# Patient Record
Sex: Female | Born: 1955 | Race: Black or African American | Hispanic: No | Marital: Single | Smoking: Current every day smoker
Health system: Southern US, Community
[De-identification: ages and names within clinical notes are randomized; demographics above are authoritative.]

## PROBLEM LIST (undated history)

## (undated) DIAGNOSIS — K648 Other hemorrhoids: Secondary | ICD-10-CM

## (undated) DIAGNOSIS — I6529 Occlusion and stenosis of unspecified carotid artery: Secondary | ICD-10-CM

## (undated) DIAGNOSIS — D696 Thrombocytopenia, unspecified: Secondary | ICD-10-CM

## (undated) DIAGNOSIS — E559 Vitamin D deficiency, unspecified: Secondary | ICD-10-CM

## (undated) DIAGNOSIS — I739 Peripheral vascular disease, unspecified: Secondary | ICD-10-CM

## (undated) DIAGNOSIS — IMO0001 Reserved for inherently not codable concepts without codable children: Secondary | ICD-10-CM

## (undated) DIAGNOSIS — J449 Chronic obstructive pulmonary disease, unspecified: Secondary | ICD-10-CM

## (undated) DIAGNOSIS — D649 Anemia, unspecified: Secondary | ICD-10-CM

## (undated) DIAGNOSIS — I2510 Atherosclerotic heart disease of native coronary artery without angina pectoris: Secondary | ICD-10-CM

## (undated) DIAGNOSIS — F32A Depression, unspecified: Secondary | ICD-10-CM

## (undated) DIAGNOSIS — J9691 Respiratory failure, unspecified with hypoxia: Secondary | ICD-10-CM

## (undated) DIAGNOSIS — R45851 Suicidal ideations: Secondary | ICD-10-CM

## (undated) DIAGNOSIS — J45909 Unspecified asthma, uncomplicated: Secondary | ICD-10-CM

## (undated) DIAGNOSIS — I5189 Other ill-defined heart diseases: Secondary | ICD-10-CM

## (undated) DIAGNOSIS — M1990 Unspecified osteoarthritis, unspecified site: Secondary | ICD-10-CM

## (undated) DIAGNOSIS — G458 Other transient cerebral ischemic attacks and related syndromes: Secondary | ICD-10-CM

## (undated) DIAGNOSIS — E785 Hyperlipidemia, unspecified: Secondary | ICD-10-CM

## (undated) DIAGNOSIS — K649 Unspecified hemorrhoids: Secondary | ICD-10-CM

## (undated) DIAGNOSIS — K635 Polyp of colon: Secondary | ICD-10-CM

## (undated) DIAGNOSIS — F1910 Other psychoactive substance abuse, uncomplicated: Secondary | ICD-10-CM

## (undated) DIAGNOSIS — J850 Gangrene and necrosis of lung: Secondary | ICD-10-CM

## (undated) DIAGNOSIS — I219 Acute myocardial infarction, unspecified: Secondary | ICD-10-CM

## (undated) DIAGNOSIS — C3411 Malignant neoplasm of upper lobe, right bronchus or lung: Secondary | ICD-10-CM

## (undated) DIAGNOSIS — C801 Malignant (primary) neoplasm, unspecified: Secondary | ICD-10-CM

## (undated) DIAGNOSIS — F1994 Other psychoactive substance use, unspecified with psychoactive substance-induced mood disorder: Secondary | ICD-10-CM

## (undated) DIAGNOSIS — I10 Essential (primary) hypertension: Secondary | ICD-10-CM

## (undated) DIAGNOSIS — I200 Unstable angina: Secondary | ICD-10-CM

## (undated) DIAGNOSIS — R519 Headache, unspecified: Secondary | ICD-10-CM

## (undated) DIAGNOSIS — K439 Ventral hernia without obstruction or gangrene: Secondary | ICD-10-CM

## (undated) DIAGNOSIS — J189 Pneumonia, unspecified organism: Secondary | ICD-10-CM

## (undated) DIAGNOSIS — F17200 Nicotine dependence, unspecified, uncomplicated: Secondary | ICD-10-CM

## (undated) DIAGNOSIS — T7840XA Allergy, unspecified, initial encounter: Secondary | ICD-10-CM

## (undated) DIAGNOSIS — Z720 Tobacco use: Secondary | ICD-10-CM

## (undated) DIAGNOSIS — Z9109 Other allergy status, other than to drugs and biological substances: Secondary | ICD-10-CM

## (undated) HISTORY — DX: Tobacco use: Z72.0

## (undated) HISTORY — PX: OTHER SURGICAL HISTORY: SHX169

## (undated) HISTORY — PX: CARDIAC SURGERY: SHX584

## (undated) HISTORY — PX: CARDIAC CATHETERIZATION: SHX172

## (undated) HISTORY — PX: CHOLECYSTECTOMY: SHX55

## (undated) HISTORY — PX: ENDOVASCULAR STENT INSERTION: SHX5161

## (undated) HISTORY — PX: HEMORRHOID SURGERY: SHX153

## (undated) HISTORY — PX: ABDOMINAL HYSTERECTOMY: SHX81

## (undated) HISTORY — DX: Occlusion and stenosis of unspecified carotid artery: I65.29

## (undated) HISTORY — PX: APPENDECTOMY: SHX54

## (undated) HISTORY — DX: Allergy, unspecified, initial encounter: T78.40XA

## (undated) SURGERY — BRONCHOSCOPY, FLEXIBLE
Anesthesia: Moderate Sedation | Laterality: Bilateral

---

## 2004-02-10 ENCOUNTER — Other Ambulatory Visit: Payer: Self-pay

## 2005-08-28 ENCOUNTER — Emergency Department: Payer: Self-pay | Admitting: Emergency Medicine

## 2006-04-01 ENCOUNTER — Inpatient Hospital Stay: Payer: Self-pay | Admitting: Internal Medicine

## 2006-04-01 ENCOUNTER — Other Ambulatory Visit: Payer: Self-pay

## 2006-04-03 DIAGNOSIS — I2510 Atherosclerotic heart disease of native coronary artery without angina pectoris: Secondary | ICD-10-CM

## 2006-04-03 HISTORY — DX: Atherosclerotic heart disease of native coronary artery without angina pectoris: I25.10

## 2006-10-23 ENCOUNTER — Other Ambulatory Visit: Payer: Self-pay

## 2006-10-23 ENCOUNTER — Emergency Department: Payer: Self-pay | Admitting: Unknown Physician Specialty

## 2007-02-11 ENCOUNTER — Emergency Department: Payer: Self-pay | Admitting: Emergency Medicine

## 2007-02-11 ENCOUNTER — Other Ambulatory Visit: Payer: Self-pay

## 2007-02-25 ENCOUNTER — Emergency Department: Payer: Self-pay | Admitting: Internal Medicine

## 2007-02-25 ENCOUNTER — Other Ambulatory Visit: Payer: Self-pay

## 2007-06-29 ENCOUNTER — Emergency Department: Payer: Self-pay | Admitting: Emergency Medicine

## 2009-04-05 ENCOUNTER — Emergency Department: Payer: Self-pay | Admitting: Emergency Medicine

## 2010-04-24 ENCOUNTER — Inpatient Hospital Stay: Payer: Self-pay | Admitting: *Deleted

## 2010-05-04 ENCOUNTER — Ambulatory Visit: Payer: Self-pay | Admitting: Internal Medicine

## 2010-07-18 ENCOUNTER — Emergency Department: Payer: Self-pay | Admitting: Emergency Medicine

## 2010-07-23 ENCOUNTER — Emergency Department: Payer: Self-pay | Admitting: Emergency Medicine

## 2011-01-15 ENCOUNTER — Ambulatory Visit: Payer: Self-pay | Admitting: Gastroenterology

## 2011-01-17 LAB — PATHOLOGY REPORT

## 2011-06-04 ENCOUNTER — Ambulatory Visit: Payer: Self-pay | Admitting: Internal Medicine

## 2011-06-27 ENCOUNTER — Ambulatory Visit: Payer: Self-pay | Admitting: Internal Medicine

## 2011-09-02 ENCOUNTER — Emergency Department: Payer: Self-pay | Admitting: *Deleted

## 2011-11-26 ENCOUNTER — Ambulatory Visit: Payer: Self-pay | Admitting: Vascular Surgery

## 2011-11-26 LAB — BASIC METABOLIC PANEL
Co2: 22 mmol/L (ref 21–32)
EGFR (African American): 23 — ABNORMAL LOW
EGFR (Non-African Amer.): 19 — ABNORMAL LOW
Glucose: 77 mg/dL (ref 65–99)
Potassium: 3.8 mmol/L (ref 3.5–5.1)
Sodium: 137 mmol/L (ref 136–145)

## 2011-12-12 LAB — CBC
HCT: 23.3 % — ABNORMAL LOW (ref 35.0–47.0)
HGB: 7.7 g/dL — ABNORMAL LOW (ref 12.0–16.0)
RBC: 2.64 10*6/uL — ABNORMAL LOW (ref 3.80–5.20)
RDW: 15.2 % — ABNORMAL HIGH (ref 11.5–14.5)

## 2011-12-12 LAB — BASIC METABOLIC PANEL
Anion Gap: 8 (ref 7–16)
BUN: 22 mg/dL — ABNORMAL HIGH (ref 7–18)
Calcium, Total: 8.6 mg/dL (ref 8.5–10.1)
Co2: 21 mmol/L (ref 21–32)
EGFR (African American): 44 — ABNORMAL LOW
Potassium: 3.8 mmol/L (ref 3.5–5.1)
Sodium: 139 mmol/L (ref 136–145)

## 2011-12-13 ENCOUNTER — Inpatient Hospital Stay: Payer: Self-pay | Admitting: Surgery

## 2011-12-13 LAB — HEMOGLOBIN: HGB: 9.3 g/dL — ABNORMAL LOW (ref 12.0–16.0)

## 2011-12-15 HISTORY — PX: COLONOSCOPY: SHX174

## 2011-12-15 LAB — HEMOGLOBIN: HGB: 10.3 g/dL — ABNORMAL LOW (ref 12.0–16.0)

## 2011-12-17 LAB — CBC WITH DIFFERENTIAL/PLATELET
Basophil %: 0.4 %
Eosinophil #: 0.1 10*3/uL (ref 0.0–0.7)
Eosinophil %: 1.8 %
HCT: 30.2 % — ABNORMAL LOW (ref 35.0–47.0)
Lymphocyte #: 1.6 10*3/uL (ref 1.0–3.6)
MCH: 29.3 pg (ref 26.0–34.0)
MCV: 89 fL (ref 80–100)
Monocyte #: 0.4 x10 3/mm (ref 0.2–0.9)
Neutrophil #: 3.9 10*3/uL (ref 1.4–6.5)
RBC: 3.39 10*6/uL — ABNORMAL LOW (ref 3.80–5.20)

## 2011-12-17 LAB — BASIC METABOLIC PANEL
Anion Gap: 9 (ref 7–16)
Calcium, Total: 9 mg/dL (ref 8.5–10.1)
Creatinine: 1.35 mg/dL — ABNORMAL HIGH (ref 0.60–1.30)
Glucose: 90 mg/dL (ref 65–99)
Osmolality: 270 (ref 275–301)

## 2011-12-18 LAB — CBC WITH DIFFERENTIAL/PLATELET
Basophil %: 0.4 %
HCT: 27.7 % — ABNORMAL LOW (ref 35.0–47.0)
HGB: 9.1 g/dL — ABNORMAL LOW (ref 12.0–16.0)
Lymphocyte #: 1 10*3/uL (ref 1.0–3.6)
MCHC: 32.9 g/dL (ref 32.0–36.0)
Monocyte %: 7.7 %
Neutrophil #: 3.5 10*3/uL (ref 1.4–6.5)
Neutrophil %: 70.6 %
RDW: 14.9 % — ABNORMAL HIGH (ref 11.5–14.5)
WBC: 5 10*3/uL (ref 3.6–11.0)

## 2011-12-18 LAB — BASIC METABOLIC PANEL
Calcium, Total: 8.6 mg/dL (ref 8.5–10.1)
Creatinine: 1.2 mg/dL (ref 0.60–1.30)
Glucose: 90 mg/dL (ref 65–99)
Osmolality: 274 (ref 275–301)
Potassium: 4 mmol/L (ref 3.5–5.1)

## 2011-12-19 LAB — CBC WITH DIFFERENTIAL/PLATELET
Basophil #: 0 10*3/uL (ref 0.0–0.1)
Basophil %: 0.2 %
Eosinophil #: 0.1 10*3/uL (ref 0.0–0.7)
Eosinophil %: 1.1 %
Lymphocyte %: 19.7 %
MCHC: 33.3 g/dL (ref 32.0–36.0)
MCV: 90 fL (ref 80–100)
Monocyte #: 0.4 x10 3/mm (ref 0.2–0.9)
Neutrophil #: 4.1 10*3/uL (ref 1.4–6.5)
Platelet: 137 10*3/uL — ABNORMAL LOW (ref 150–440)
RBC: 2.46 10*6/uL — ABNORMAL LOW (ref 3.80–5.20)
RDW: 14.5 % (ref 11.5–14.5)
WBC: 5.7 10*3/uL (ref 3.6–11.0)

## 2011-12-19 LAB — IRON AND TIBC
Iron Saturation: 11 %
Iron: 40 ug/dL — ABNORMAL LOW (ref 50–170)

## 2011-12-19 LAB — LACTATE DEHYDROGENASE: LDH: 765 U/L — ABNORMAL HIGH (ref 84–246)

## 2011-12-20 LAB — HEMOGLOBIN: HGB: 9.4 g/dL — ABNORMAL LOW (ref 12.0–16.0)

## 2011-12-24 ENCOUNTER — Ambulatory Visit: Payer: Self-pay | Admitting: Vascular Surgery

## 2011-12-24 LAB — CREATININE, SERUM: EGFR (African American): >60

## 2011-12-24 LAB — PATHOLOGY REPORT

## 2011-12-24 LAB — BUN: BUN: 9 mg/dL (ref 7–18)

## 2012-04-30 ENCOUNTER — Ambulatory Visit: Payer: Self-pay | Admitting: Vascular Surgery

## 2012-04-30 LAB — BASIC METABOLIC PANEL
Calcium, Total: 9.9 mg/dL (ref 8.5–10.1)
Chloride: 104 mmol/L (ref 98–107)
EGFR (Non-African Amer.): >60
Glucose: 94 mg/dL (ref 65–99)
Osmolality: 279 (ref 275–301)
Potassium: 4.2 mmol/L (ref 3.5–5.1)
Sodium: 139 mmol/L (ref 136–145)

## 2012-06-04 ENCOUNTER — Ambulatory Visit: Payer: Self-pay | Admitting: Internal Medicine

## 2012-09-19 ENCOUNTER — Ambulatory Visit: Payer: Self-pay | Admitting: Physician Assistant

## 2012-09-26 ENCOUNTER — Ambulatory Visit: Payer: Self-pay | Admitting: Internal Medicine

## 2012-09-30 ENCOUNTER — Ambulatory Visit: Payer: Self-pay | Admitting: Internal Medicine

## 2012-10-14 ENCOUNTER — Ambulatory Visit: Payer: Self-pay | Admitting: Physician Assistant

## 2012-10-20 ENCOUNTER — Ambulatory Visit: Payer: Self-pay | Admitting: Gastroenterology

## 2012-11-17 ENCOUNTER — Encounter: Payer: Self-pay | Admitting: Neurosurgery

## 2012-12-02 ENCOUNTER — Encounter: Payer: Self-pay | Admitting: Neurosurgery

## 2013-06-25 ENCOUNTER — Emergency Department: Payer: Self-pay | Admitting: Emergency Medicine

## 2013-07-09 ENCOUNTER — Ambulatory Visit: Payer: Self-pay | Admitting: Physician Assistant

## 2013-10-13 ENCOUNTER — Ambulatory Visit: Payer: Self-pay | Admitting: Neurosurgery

## 2013-10-15 ENCOUNTER — Observation Stay: Payer: Self-pay | Admitting: Internal Medicine

## 2013-10-15 LAB — CK TOTAL AND CKMB (NOT AT ARMC)
CK, Total: 199 U/L — ABNORMAL HIGH
CK, Total: 202 U/L — ABNORMAL HIGH
CK, Total: 216 U/L — ABNORMAL HIGH
CK-MB: 0.5 ng/mL (ref 0.5–3.6)
CK-MB: 0.8 ng/mL (ref 0.5–3.6)
CK-MB: 0.6 ng/mL (ref 0.5–3.6)

## 2013-10-15 LAB — BASIC METABOLIC PANEL
Anion Gap: 5 — ABNORMAL LOW (ref 7–16)
BUN: 12 mg/dL (ref 7–18)
CALCIUM: 9.1 mg/dL (ref 8.5–10.1)
CHLORIDE: 102 mmol/L (ref 98–107)
Co2: 24 mmol/L (ref 21–32)
Creatinine: 1.2 mg/dL (ref 0.60–1.30)
EGFR (African American): 58 — ABNORMAL LOW
GFR CALC NON AF AMER: 50 — AB
Glucose: 111 mg/dL — ABNORMAL HIGH (ref 65–99)
OSMOLALITY: 263 (ref 275–301)
Potassium: 3.6 mmol/L (ref 3.5–5.1)
Sodium: 131 mmol/L — ABNORMAL LOW (ref 136–145)

## 2013-10-15 LAB — CBC WITH DIFFERENTIAL/PLATELET
BASOS ABS: 0 10*3/uL (ref 0.0–0.1)
Basophil %: 0.4 %
EOS ABS: 0 10*3/uL (ref 0.0–0.7)
Eosinophil %: 0.1 %
HCT: 36.7 % (ref 35.0–47.0)
HGB: 12.1 g/dL (ref 12.0–16.0)
Lymphocyte #: 0.9 10*3/uL — ABNORMAL LOW (ref 1.0–3.6)
Lymphocyte %: 17.4 %
MCH: 29 pg (ref 26.0–34.0)
MCHC: 32.9 g/dL (ref 32.0–36.0)
MCV: 88 fL (ref 80–100)
MONO ABS: 0.4 x10 3/mm (ref 0.2–0.9)
Monocyte %: 7.3 %
Neutrophil #: 3.8 10*3/uL (ref 1.4–6.5)
Neutrophil %: 74.8 %
PLATELETS: 153 10*3/uL (ref 150–440)
RBC: 4.18 10*6/uL (ref 3.80–5.20)
RDW: 14.9 % — AB (ref 11.5–14.5)
WBC: 5.1 10*3/uL (ref 3.6–11.0)

## 2013-10-15 LAB — URINALYSIS, COMPLETE
Bacteria: NONE SEEN
Bilirubin,UR: NEGATIVE
Blood: NEGATIVE
Glucose,UR: NEGATIVE mg/dL (ref 0–75)
Granular Cast: 1
Hyaline Cast: 1
Ketone: NEGATIVE
Leukocyte Esterase: NEGATIVE
NITRITE: NEGATIVE
Ph: 5 (ref 4.5–8.0)
Protein: 30
Specific Gravity: 1.015 (ref 1.003–1.030)
Squamous Epithelial: <1

## 2013-10-15 LAB — TROPONIN I
TROPONIN-I: 0.04 ng/mL
TROPONIN-I: 0.04 ng/mL
Troponin-I: 0.03 ng/mL

## 2013-10-16 LAB — LIPID PANEL
CHOLESTEROL: 147 mg/dL (ref 0–200)
HDL: 31 mg/dL — AB (ref 40–60)
Ldl Cholesterol, Calc: 92 mg/dL (ref 0–100)
Triglycerides: 122 mg/dL (ref 0–200)
VLDL CHOLESTEROL, CALC: 24 mg/dL (ref 5–40)

## 2014-01-08 ENCOUNTER — Emergency Department: Payer: Self-pay | Admitting: Emergency Medicine

## 2014-08-09 ENCOUNTER — Ambulatory Visit: Payer: Self-pay | Admitting: Physician Assistant

## 2014-09-11 DIAGNOSIS — F17200 Nicotine dependence, unspecified, uncomplicated: Secondary | ICD-10-CM | POA: Diagnosis not present

## 2014-09-11 DIAGNOSIS — G4730 Sleep apnea, unspecified: Secondary | ICD-10-CM | POA: Diagnosis not present

## 2014-09-11 DIAGNOSIS — G478 Other sleep disorders: Secondary | ICD-10-CM | POA: Diagnosis not present

## 2014-10-04 DIAGNOSIS — G478 Other sleep disorders: Secondary | ICD-10-CM | POA: Diagnosis not present

## 2014-10-04 DIAGNOSIS — F17200 Nicotine dependence, unspecified, uncomplicated: Secondary | ICD-10-CM | POA: Diagnosis not present

## 2014-10-04 DIAGNOSIS — G4730 Sleep apnea, unspecified: Secondary | ICD-10-CM | POA: Diagnosis not present

## 2014-10-12 DIAGNOSIS — G478 Other sleep disorders: Secondary | ICD-10-CM | POA: Diagnosis not present

## 2014-10-12 DIAGNOSIS — F17210 Nicotine dependence, cigarettes, uncomplicated: Secondary | ICD-10-CM | POA: Diagnosis not present

## 2014-10-12 DIAGNOSIS — J439 Emphysema, unspecified: Secondary | ICD-10-CM | POA: Diagnosis not present

## 2014-10-12 DIAGNOSIS — I10 Essential (primary) hypertension: Secondary | ICD-10-CM | POA: Diagnosis not present

## 2014-10-12 DIAGNOSIS — F17200 Nicotine dependence, unspecified, uncomplicated: Secondary | ICD-10-CM | POA: Diagnosis not present

## 2014-10-12 DIAGNOSIS — M4306 Spondylolysis, lumbar region: Secondary | ICD-10-CM | POA: Diagnosis not present

## 2014-10-12 DIAGNOSIS — F330 Major depressive disorder, recurrent, mild: Secondary | ICD-10-CM | POA: Diagnosis not present

## 2014-10-12 DIAGNOSIS — G4730 Sleep apnea, unspecified: Secondary | ICD-10-CM | POA: Diagnosis not present

## 2014-10-12 DIAGNOSIS — I6523 Occlusion and stenosis of bilateral carotid arteries: Secondary | ICD-10-CM | POA: Diagnosis not present

## 2014-11-01 DIAGNOSIS — Z79891 Long term (current) use of opiate analgesic: Secondary | ICD-10-CM | POA: Diagnosis not present

## 2014-11-01 DIAGNOSIS — G8929 Other chronic pain: Secondary | ICD-10-CM | POA: Diagnosis not present

## 2014-11-01 DIAGNOSIS — F1120 Opioid dependence, uncomplicated: Secondary | ICD-10-CM | POA: Diagnosis not present

## 2014-11-01 DIAGNOSIS — M545 Low back pain: Secondary | ICD-10-CM | POA: Diagnosis not present

## 2014-11-10 DIAGNOSIS — G4730 Sleep apnea, unspecified: Secondary | ICD-10-CM | POA: Diagnosis not present

## 2014-11-10 DIAGNOSIS — F17200 Nicotine dependence, unspecified, uncomplicated: Secondary | ICD-10-CM | POA: Diagnosis not present

## 2014-11-10 DIAGNOSIS — G478 Other sleep disorders: Secondary | ICD-10-CM | POA: Diagnosis not present

## 2014-11-11 DIAGNOSIS — I340 Nonrheumatic mitral (valve) insufficiency: Secondary | ICD-10-CM | POA: Diagnosis not present

## 2014-11-11 DIAGNOSIS — I10 Essential (primary) hypertension: Secondary | ICD-10-CM | POA: Diagnosis not present

## 2014-11-11 DIAGNOSIS — I2510 Atherosclerotic heart disease of native coronary artery without angina pectoris: Secondary | ICD-10-CM | POA: Diagnosis not present

## 2014-11-11 DIAGNOSIS — I739 Peripheral vascular disease, unspecified: Secondary | ICD-10-CM | POA: Diagnosis not present

## 2014-12-11 DIAGNOSIS — G478 Other sleep disorders: Secondary | ICD-10-CM | POA: Diagnosis not present

## 2014-12-11 DIAGNOSIS — F17200 Nicotine dependence, unspecified, uncomplicated: Secondary | ICD-10-CM | POA: Diagnosis not present

## 2014-12-11 DIAGNOSIS — G4730 Sleep apnea, unspecified: Secondary | ICD-10-CM | POA: Diagnosis not present

## 2014-12-21 NOTE — Op Note (Signed)
PATIENT NAME:  [name], [name] MR#:  [id number] DATE OF BIRTH:  Aug 09, 1956  DATE OF PROCEDURE:  04/30/2012  PREOPERATIVE DIAGNOSES:  1. Peripheral arterial disease with claudication bilateral lower extremities, worse on the right.  2. Tobacco dependence.  3. Hyperlipidemia.  4. Hypertension.   POSTOPERATIVE DIAGNOSES: 1. Peripheral arterial disease with claudication bilateral lower extremities, worse on the right.  2. Tobacco dependence.  3. Hyperlipidemia.  4. Hypertension.   PROCEDURES:  1. Ultrasound guidance for vascular access to the left femoral artery.  2. Catheter placement in the right superficial femoral artery from left femoral approach.  3. Aortogram and selective right lower extremity angiogram.  4. Percutaneous transluminal angioplasty of right superficial femoral artery in the proximal segment with 5 and 6 mm diameter angioplasty balloon.  5. Percutaneous transluminal angioplasty of left external iliac artery in the distal segment with a 6 mm diameter angioplasty balloon.  6. StarClose closure device, left femoral artery.   SURGEON: Erika Huxley, MD   ANESTHESIA: Local with moderate conscious sedation.   ESTIMATED BLOOD LOSS: Approximately 25 mL.   INDICATION FOR PROCEDURE: This is a 59 year old African American female with advanced peripheral vascular disease largely due to her tobacco dependence, hypertension, and hyperlipidemia. She has already undergone previous bilateral lower extremity interventions for claudication. She has had recurrent symptoms worse on the right. Angiogram is performed for further evaluation and possible treatment. Risks and benefits are discussed. Informed consent was obtained.   DESCRIPTION OF PROCEDURE: The patient was brought to the Vascular Interventional Radiology Suite. Groins were shaved and prepped and a sterile surgical field was created. Ultrasound was used to visualize a patent left femoral artery. It was then accessed in the left  common femoral artery without difficulty with a Seldinger needle. A J-wire and 5 French sheath was placed. Pigtail catheter was placed in the aorta at the L1 level. This showed diffuse aortoiliac calcification. There was mild stenosis in the right iliac system but was not flow limiting. There was an approximately 50 to 60% stenosis in the left external iliac artery about 5 to 6 cm above the entry of the sheath in the common femoral artery. There was a high-grade stenosis at the origin of the right superficial femoral artery. There remained a superficial femoral artery that had no flow limiting stenosis and there was two-vessel runoff distally. The patient was systemically heparinized. A 6 French Ansel sheath was placed over a Terumo advantage wire and I was able to cross the lesion in the superficial femoral artery without difficulty with a Terumo advantage wire. The lesion was treated with a 5 mm diameter angioplasty balloon and a 6 mm diameter angioplasty balloon. Following the second angioplasty there was still an approximately 50% residual stenosis, however, this is in a location that was not sound to place a stent for non limb threatening symptoms and so the stenosis remained. She had reasonably small vessels and ballooning to this area to 7 mm would have been too large for this on this occasion. Her runoff remained patent after intervention. I then pulled the sheath back to below the left iliac artery stenosis and oblique arteriogram was performed. This lesion was then ballooned with a 6 mm diameter angioplasty balloon with good angiographic completion result and less than 20% residual stenosis. StarClose closure device was then deployed in the usual fashion with excellent hemostatic result. The patient tolerated the procedure well and was taken to the recovery room in stable condition.   ____________________________ Erika Huxley,  MD jsd:drc D: 04/30/2012 11:14:13 ET T: 04/30/2012 12:15:32  ET JOB#: [id number]  cc: Erika Huxley, MD, <Dictator> [name]. Erika Miles, MD Erika Huxley MD ELECTRONICALLY SIGNED 05/07/2012 10:01

## 2014-12-25 NOTE — H&P (Signed)
PATIENT NAME:  [name], [name] MR#:  [id number] DATE OF BIRTH:  March 11, 1956  DATE OF ADMISSION:  10/15/2013  PRIMARY CARE PHYSICIAN: Dr. Clayborn Bigness, M.D.   CHIEF COMPLAINT: Dizziness today at PCPs office along with abnormal EKG showing ST-T changes in lateral leads which is new since January 2014.    [name] is a 59 year old African American female with history of peripheral vascular disease status post PTCA of the right superficial femoral artery and left  artery in August 2013, history of tobacco abuse, hyperlipidemia and remote history of myocardial infarction in the past along with hypertension, comes to the Emergency Room after she was seen at her primary care physician's office for chest congestion. The patient received a breathing treatment which was given by a Leta Baptist, PA at Dr. Laurelyn Sickle office. The patient also received a dose of "Benadryl" which she reports and thereafter she started feeling dizzy and lightheaded. EKG was done which showed some ST-T changes in the lateral leads, which were new since January 2014 EKG,  and she was sent to the Emergency Room for further evaluation and management. In the Emergency Room, the patient received aspirin. She is in sinus rhythm. Her blood pressure is stable. She denies any chest pain. Dr. Humphrey Rolls from cardiology health was consulted by ER physician and recommends admission to the hospital.   PAST MEDICAL HISTORY:  Peripheral vascular disease with claudication bilateral lower extremity with PTCA of right superficial femoral artery along PTCA of left external iliac artery  in August 2013.  2. History of tobacco abuse.  3. Hyperlipidemia.  4. Hypertension.  5. Ongoing tobacco abuse.   ALLERGIES: ASPIRIN; however, the patient is currently taking enteric-coated aspirin and she is tolerating it well.   HOME MEDICATIONS: 1. cetritizine10 mg p.o. daily.  2. Cyclobenzaprine 5 mg b.i.d. p.r.n.  3. Flonase 0.05% nasal spray in both nose b.i.d.  4.  Lipitor 20 mg at bedtime.  5. Losartan 50 mg daily.  6. Oxycodone acetaminophen 5/325, 1 b.i.d. p.r.n.  7. Voltaren 1% affected joints.  8. Aspirin 81 mg daily.   FAMILY HISTORY: Positive for hypertension.   SOCIAL HISTORY: Continues to smoke more than pack per day. Denies alcohol use.    REVIEW OF SYSTEMS:  CONSTITUTIONAL: No fever or fatigue, weakness.  EYES: No blurred or double vision or cataracts.  ENT: No tinnitus, ear pain, hearing loss.  RESPIRATORY: Positive for shortness of breath and some URI symptoms. No wheeze, hemoptysis or COPD.  CARDIOVASCULAR: No chest pain, orthopnea, edema or dyspnea on exertion.  GASTROINTESTINAL: No nausea, vomiting, diarrhea, abdominal pain or GERD.  GENITOURINARY: No dysuria or hematuria, renal calculus or frequency.  ENDOCRINE: No polyuria, nocturia or thyroid problems.  HEMATOLOGY: No anemia or easy bruising.  SKIN: No acne, rash or lesion.  MUSCULOSKELETAL: Positive arthritis.  NEUROLOGIC: No CVA, TIA, ataxia or seizures.  PSYCHIATRIC: No anxiety, depression or bipolar disorder.   All other systems reviewed and negative.   PHYSICAL EXAMINATION: GENERAL: The patient is awake, alert, oriented x 3, not in acute distress.  VITAL SIGNS: She is afebrile, pulse is 80. Blood pressure is 134/70, sats are 98% on room air.  HEENT: Atraumatic, normocephalic. Pupils: PERRLA,  EOM intact. Oral mucosa is moist.  NECK: Supple. No JVD. No carotid bruit.  LUNGS: Clear to auscultation bilaterally. No rales, rhonchi, respiratory distress or labored breathing.  CARDIOVASCULAR: Both heart sounds are normal. Rate, rhythm regular. PMI not lateralized. Chest is nontender.  EXTREMITIES: Good pedal pulses, good femoral pulses.  No lower extremity edema.  ABDOMEN: Soft, benign, nontender. No organomegaly. Positive bowel sounds.  NEUROLOGIC: Grossly intact cranial nerves II through XII. No motor or sensory.  PSYCHIATRIC: No motor or sensory deficit. The patient is  awake, alert, oriented x 3.   EKG shows sinus rhythm with short PR interval with ST-T wave abnormality with flipped T waves in lateral leads. This is new since the comparison of January 2014.   CHEST X-RAY: No active disease. UA negative for UTI. Cardiac enzymes first set negative. CBC within normal limits. Basic metabolic panel within normal limits.  ASSESSMENT: A 59 year old [name] with history of peripheral vascular disease.. Remote history of myocardial infarction in the past, along with history of carotid stenosis, and ongoing tobacco abuse comes in with:  1. Abnormal EKG with ST-T changes in lateral leads. These are new changes since January 2014. The patient is asymptomatic. Does not have any chest pain. Did have some earlier dizziness and lightheadedness after she got breathing treatment and dose of questionable Benadryl at her PCPs office. The patient is going to be admitted for overnight observation, cycle cardiac enzymes x 3. Dr. Humphrey Rolls from cardiology will see the patient.  The ER physician has already spoken with him. We will check lipid profile, continue the patient on aspirin p.r.n., nitroglycerin and continue her statins.  2. Hyperlipidemia. Continue statins.  3. Chronic chronic obstructive pulmonary disease/emphysema. The patient currently does not have any flare. She does have some mild upper respiratory infection symptoms. We will give her oxygen as needed and nebulizer treatments as needed.  3. Tobacco abuse. The patient is advised on smoking cessation, about three minutes was spent for counseling. It seems the patient does not appear to be motivated.  4. Hypertension. Continue losartan.  5. Deep vein thrombosis prophylaxis. Subcutaneous heparin.  6. Further work-up according to the patient's clinical course. Hospital admission plan was discussed with the patient and the patient's family members.   TIME SPENT: 45 minutes.    ____________________________ Hart Rochester Posey Pronto,  MD sap:sg D: 10/15/2013 14:59:47 ET T: 10/15/2013 15:12:43 ET JOB#: [id number]  cc: Cabell Lazenby A. Posey Pronto, MD, <Dictator> Lavera Guise, MD   Ilda Basset MD ELECTRONICALLY SIGNED 10/22/2013 17:10

## 2014-12-25 NOTE — Consult Note (Signed)
PATIENT NAME:  [name], [name] MR#:  [id number] DATE OF BIRTH:  Mar 25, 1956  DATE OF CONSULTATION:  10/15/2013  CARDIOLOGY CONSULTATION  CONSULTING PHYSICIAN:  Dionisio David, MD  INDICATION FOR CONSULTATION: Abnormal EKG, chest pain and dizziness.   This is a 59 year old African American female with a past medical history of peripheral vascular disease status post angioplasty of the right superficial femoral artery and the left femoral artery in August 2013. History of hyperlipidemia, history of MI came into the hospital with chest pain and dizziness. She apparently had abnormal EKG in the office from [facility] and was sent here for further evaluation. She was having also tightness in the chest and dizziness. She was given Benadryl in the office and she got worse.   PAST MEDICAL HISTORY: History of hyperlipidemia, hypertension, tobacco use.   ALLERGIES: ASPIRIN.   FAMILY HISTORY: Positive for coronary artery disease.   SOCIAL HISTORY: She continues to smoke 1 pack per day. Denies EtOH abuse.   PHYSICAL EXAMINATION: GENERAL: She is alert, oriented x 3, in no acute distress.  VITAL SIGNS: Stable.  HEENT:  Revealed no JVD.  LUNGS: Clear.  HEART: Regular rate and rhythm. Normal S1, S2. No audible murmur.  ABDOMEN: Soft, nontender, positive bowel sounds.  EXTREMITIES: No pedal edema.  NEUROLOGIC: The patient appears to be intact.   LABORATORY STUDIES: EKG shows normal sinus rhythm, LVH with ST depression V4 to V6 along with T-wave inversion, which are apparently new from previous EKGs in January 2014. Cardiac enzymes first test set is negative; the second set is pending. The rest of the labs are unremarkable.   ASSESSMENT AND PLAN: The patient has acute coronary syndrome with abnormal EKG with new ST and T changes in the inferolateral leads associated with dizziness and tightness in the chest. Advised further evaluation by doing cardiac  catheterization.   ____________________________ Dionisio David, MD sak:ce D: 10/15/2013 17:23:30 ET T: 10/15/2013 17:34:06 ET JOB#: [id number]  cc: Dionisio David, MD, <Dictator> Dionisio David MD ELECTRONICALLY SIGNED 10/16/2013 8:39

## 2014-12-25 NOTE — Discharge Summary (Signed)
PATIENT NAME:  [name], [name] MR#:  [id number] DATE OF BIRTH:  1956-02-22  DATE OF ADMISSION:  10/15/2013 DATE OF DISCHARGE:  10/16/2013   PRIMARY CARE PROVIDER: Neoma Laming, MD    DIAGNOSES:  1.  Abnormal EKG with no significant occlusion on catheter.  2.  Pleuritic chest pain secondary to acute bronchitis.  3.  Acute bronchitis.  4.  Chronic obstructive pulmonary disease.  5.  Tobacco abuse.  6.  Hypertension.   IMAGING STUDIES: Include portable chest x-ray which showed no acute disease.   CONSULTATIONS: Dr. Humphrey Rolls with cardiology.   PROCEDURES: Cardiac catheterization showed proximal RCA 100% with good collaterals from the LAD and left circumflex and moderate disease in mid left circumflex and LAD with normal left ventricular ejection fraction. Advised aggressive medical management.   ADMITTING HISTORY AND HOSPITAL COURSE: Please see detailed H and P dictated previously by Dr. Posey Pronto. In brief, a 59 year old female patient with history of CAD who presented to the hospital after she was noticed to have some pleuritic chest pain at her doctor's office but also had EKG changes. The patient was wheezing, had acute bronchitis. The patient was brought to the hospital.   Dr. Humphrey Rolls with cardiology saw the patient, asked that we do a cardiac cath. The cath showed no significant occlusion. The patient had some dizziness secondary to Benadryl which has resolved. Breathing is not labored but she does have wheezing for which she is being started on prednisone. Has albuterol inhaler at home.   Currently the patient has wheezing on examination.   DISCHARGE MEDICATIONS:  Include:  1.  Lipitor 20 mg daily.  2.  Calcium vitamin D 1 tablet daily.  3.  Aspirin 81 mg daily.  4.  Plavix 75 mg daily.  6.  Flexeril 5 mg two times a day as needed.  7.  Flonase 50 mcg nasal spray 2 times a day.  8.  Losartan 50 mg daily.  9.  Acetaminophen oxycodone 325/5, one tablet oral 2 times a day as needed.  10.   Prednisone 60 mg tapered by 10 mg a day for 6 days.   DISCHARGE INSTRUCTIONS: Cardiac diet. Activities as tolerated.   FOLLOWUP: Dr. Humphrey Rolls of cardiology in 1 to 2 weeks and primary care physician in 1 to 2 weeks.   TIME SPENT ON DAY OF DISCHARGE IN DISCHARGE ACTIVITY: 40 minutes.   ____________________________ Leia Alf Carlotta Telfair, MD srs:np D: 10/16/2013 14:53:25 ET T: 10/16/2013 15:22:43 ET JOB#: [id number]  cc: Alveta Heimlich R. Nic Lampe, MD, <Dictator> Leighton Ruff. Radford Pax, PA-C Dionisio David, MD [place] MD ELECTRONICALLY SIGNED 10/22/2013 14:09

## 2014-12-26 NOTE — Consult Note (Signed)
Chief Complaint:   Subjective/Chief Complaint Patient with rectal bleeding twice today with bowel movement, small amount but enough to color the water red. Hemodynamically stable. H and H not done today. Case discussed with on call GI, Dr Candace Cruise. Dr. Candace Cruise has recommended to continue clear liquids, repeat CBC in am and he will evaluate the patient tomorrow to decide about the timing of colonoscopy.   Electronic Signatures: Jill Side (MD)  (Signed 12-Apr-13 16:00)  Authored: Chief Complaint   Last Updated: 12-Apr-13 16:00 by Jill Side (MD)

## 2014-12-26 NOTE — Consult Note (Signed)
Brief Consult Note: Diagnosis: Hematochezia.   Patient was seen by consultant.   Consult note dictated.   Orders entered.   Comments: Patient with significant hematochezia and anemia. Clinically it appears to be diverticular bleed although only small polyps and hemorrhoids were reported on last colonoscopy about a year ago. H and H better after transfusion. I would suggest another colonoscopy for further evaluation. Patient is in full agreement. As she had regular diet all day today colonoscopy can not be doen tomorrow. Will reevaluate in am and make further plans.  Electronic Signatures: Jill Side (MD)  (Signed 11-Apr-13 18:39)  Authored: Brief Consult Note   Last Updated: 11-Apr-13 18:39 by Jill Side (MD)

## 2014-12-26 NOTE — Discharge Summary (Signed)
PATIENT NAME:  [name], [name] MR#:  [id number] DATE OF BIRTH:  [birth year]  DATE OF ADMISSION:  12/13/2011 DATE OF DISCHARGE:  12/20/2011  BRIEF HISTORY: Only is a 59 year old woman admitted to the internal medicine service with gastrointestinal bleeding. She has had chronic bright red blood per rectum and felt that she had severe hemorrhoids but her bleeding got worse on the day of admission and presented to the Emergency Room with a hemoglobin of 7.7. She has a history of coronary artery disease and hypertension so the low hemoglobin was of concern.   HOSPITAL COURSE: She was admitted the hospital where she underwent transfusion. Hemoglobin after 2 units came up to 10.3. GI service was consulted and recommended a colonoscopy which was performed on the 15th. The procedure demonstrated some polypoid disease but no evidence of any significant diverticulosis to explain her bleeding. She did have very large friable hemorrhoids. She had a previous history of hemorrhoidectomy many years ago and it was felt that her recurrent hemorrhoids were likely the source for ongoing bleeding. The surgical service was consulted. After appropriate preoperative preparation and informed consent, she was taken to surgery on the morning of April 16th where she underwent examination under anesthesia and a PPH stapled hemorrhoidpexy. She did well postoperatively. Her bleeding is much improved. There is no evidence of any significant postoperative problem. She is up, active, and tolerating a diet.   DISPOSITION: We will discharge her home today to be followed in the office in 7 to 10 days' time.   DISCHARGE MEDICATIONS: She is to take her home medications including atorvastatin 20 mg p.o. once a day, bupropion 150 mg b.i.d., Ecotrin 325 mg p.o. daily, hydrochlorothiazide 25 mg p.o. daily, losartan 100 mg p.o. daily. She is to take Percocet for pain. Colace 100 mg p.o. b.i.d.   DISCHARGE DIAGNOSES: Lower gastrointestinal bleeding,  hemorrhoids, coronary artery disease, hypertension.     SURGERY: Colonoscopy, rectal examination under anesthesia, and hemorrhoidpexy.  ____________________________ Rodena Goldmann III, MD rle:rbg D: 12/20/2011 08:36:08 ET T: 12/20/2011 14:30:14 ET JOB#: [id number]  cc: Rodena Goldmann III, MD, <Dictator> Melinda Crutch. Candiss Norse, MD  Rodena Goldmann MD ELECTRONICALLY SIGNED 12/28/2011 22:19

## 2014-12-26 NOTE — Consult Note (Signed)
PATIENT NAME:  [name], [name] MR#:  [id number] DATE OF BIRTH:  1956-02-18  DATE OF CONSULTATION:  12/13/2011  CONSULTING PHYSICIAN:  Lark Langenfeld A. Marina Gravel, MD  REASON FOR CONSULTATION: Bleeding internal hemorrhoids.   HISTORY: The patient is a 59 year old white female with a history of chronic renal insufficiency, and peripheral artery occlusive disease, and a long-standing history of intermittently bleeding hemorrhoids. She had a hemorrhoidectomy in the year 1994 for bleeding and since then has had intermittent bleeding every 2 to 3 weeks on a chronic basis for the last 19 years. Over the last several days, the bleeding has become much worse, prompting admission to the hospital with hypotension in the 60s and hemoglobin of 7.7. In December of last year, the patient's hemoglobin was 12. She had a colonoscopy performed by Dr. Loistine Simas of Gastroenterology at [facility] back in May of 2012 demonstrating some benign polyps and internal hemorrhoids. She has a history of coronary artery disease, hypertension, peripheral artery occlusive disease, cholecystectomy, hysterectomy, and continued tobacco abuse and dependence. She has been admitted to the Medical Service, and blood transfusion has been performed. She is now hemodynamically stable, and Surgery has been asked to consult.   ALLERGIES: None.   MEDICATIONS: Lipitor, aspirin, and BuSpar.   SOCIAL HISTORY: One pack per day smoking. She does not drink. No drug use.   FAMILY HISTORY: Family history is significant for coronary artery disease and hypertension.   REVIEW OF SYSTEMS: Review of systems is as described above, and otherwise ten-point review is unremarkable.   PHYSICAL EXAMINATION:  GENERAL: The patient is hemodynamically stable.   VITAL SIGNS: Temperature is 98.2, pulse is 75, blood pressure is 109/69. Affect is normal.   NEUROLOGICAL: Examination is unremarkable.   LUNGS: Clear.   HEART: Regular rate and rhythm.   ABDOMEN: Soft  and nontender, multiple scars.   RECTAL: Examination of her perineum demonstrates a rosette of external/internal hemorrhoids. There was blood on a peripad during the examination. It is bright red in nature.   EXTREMITIES: Warm and well perfused.   LABORATORY DATA: Admission hemoglobin 7.7, creatinine 1.56, chloride 110, MCV is 88. White count is 5.0, platelet count 201,000.   IMPRESSION: Anemia secondary to GI blood loss, presumed from chronic bleeding of internal hemorrhoids which has become acutely worse in the last 2 to 3 days prompting admission and transfusion.   RECOMMENDATIONS:  1. I agree with GI Medicine consultation for a minimum of a flexible sigmoidoscopy.  2. I will start her on ProctoFoam-HC.  3. We will follow up on a CBC.  4. She may require a hemorrhoidectomy during this hospitalization. If not, she can follow up with me in the office as an outpatient.   ____________________________ Jeannette How. Marina Gravel, MD mab:cbb D: 12/13/2011 10:40:00 ET T: 12/13/2011 11:50:48 ET JOB#: [id number]  cc: Elta Guadeloupe A. Marina Gravel, MD, <Dictator> Melinda Crutch. Candiss Norse, MD Kacen Mellinger Bettina Gavia MD ELECTRONICALLY SIGNED 12/17/2011 7:59

## 2014-12-26 NOTE — Consult Note (Signed)
Brief Consult Note: Diagnosis: anemia secondary to GI blood loss, prolaspeding internal external hemorroids.   Patient was seen by consultant.   Consult note dictated.   Recommend further assessment or treatment.   Discussed with Attending MD.   Comments: Now ablt to do any surgery today, may benefit from [facility] will discuss with Dr. Pat Patrick as he does this procedure.  follow-up on CBC.  Electronic Signatures: Sherri Rad (MD)  (Signed 11-Apr-13 10:21)  Authored: Brief Consult Note   Last Updated: 11-Apr-13 10:21 by Sherri Rad (MD)

## 2014-12-26 NOTE — Consult Note (Signed)
Chief Complaint:   Subjective/Chief Complaint Covering for Dr. Suzette Battiest. Some bleeding last night. None so far this AM.   VITAL SIGNS/ANCILLARY NOTES: **Vital Signs.:   13-Apr-13 10:31   Temperature Temperature (F) 97.7   Celsius 36.5   Temperature Source oral   Pulse Pulse 83   Respirations Respirations 18   Systolic BP Systolic BP 98   Diastolic BP (mmHg) Diastolic BP (mmHg) 66   Mean BP 76   Pulse Ox % Pulse Ox % 98   Pulse Ox Activity Level  At rest   Oxygen Delivery Room Air/ 21 %   Brief Assessment:   Cardiac Regular    Respiratory clear BS    Gastrointestinal Normal   Routine Hem:  13-Apr-13 05:09    Hemoglobin (CBC) 10.3   Assessment/Plan:  Assessment/Plan:   Assessment LGI bleeding. Diveriticular vs hemorrhoidal bleeding. Hgb stable.    Plan Will prep patient today and plan for colonoscoyp tomorrow AM. Thanks.   Electronic Signatures: Verdie Shire (MD)  (Signed 13-Apr-13 10:42)  Authored: Chief Complaint, VITAL SIGNS/ANCILLARY NOTES, Brief Assessment, Lab Results, Assessment/Plan   Last Updated: 13-Apr-13 10:42 by Verdie Shire (MD)

## 2014-12-26 NOTE — Op Note (Signed)
PATIENT NAME:  [name], [name] MR#:  [id number] DATE OF BIRTH:  Jun 25, 1956  DATE OF PROCEDURE:  12/18/2011  PREOPERATIVE DIAGNOSIS: Rectal bleeding.  POSTOPERATIVE DIAGNOSIS: Hemorrhoids, internal.  PROCEDURES:  1. Rectal exam under anesthesia. 2. Stapled PPH hemorrhoidopexy.   SURGEON: Rodena Goldmann, III, MD   ANESTHESIA: General.   OPERATIVE PROCEDURE: With the patient in the supine position after the induction of appropriate general anesthesia, the patient was placed in lithotomy position, appropriately padded and positioned. The perineal area was examined. She had significant stage IV protruding hemorrhoids which would not reduce. Rectal exam under anesthesia did not demonstrate any abnormal mucosal lesions. Bivalve retractor was used to examine the rectum. There did not appear to be any evidence of a mass or fissure. The [place] stapled hemorrhoidopexy was chosen as the procedure of choice in this situation. The introducer was placed with the obturator and sutured in place with 0 nylon. 2-0 Prolene sutures were placed in a purse-string 2 cm above the highest hemorrhoidal cluster. The purse-string appeared to be satisfactory. The Gardner device was inserted through the purse-string and the purse-string secured on the anvil. The sutured tags were then pulled back through the device and secured for traction. The device was slowly closed and held in place for two minutes. Bimanual exam was carried out to ensure that there was no rectal vaginal wall involvement. Stapler was fired and then held for 45 seconds to assist hemostasis. The obturator was removed. Using bivalve retractor, the staple line was observed and appeared to be satisfactorily dry. Two small bleeding spots were cauterized with the Bovie. A packing of Gelfoam and Avitene was placed in the rectum and sterile dressing applied.       The patient was returned to the recovery room having tolerated the procedure well. Sponge, instrument, and  needle counts were correct x2 in the operating room.   ____________________________ Micheline Maze, MD rle:drc D: 12/18/2011 11:34:43 ET T: 12/18/2011 11:48:58 ET JOB#: [id number]  cc: Rodena Goldmann III, MD, <Dictator> Melinda Crutch. Candiss Norse, MD Rodena Goldmann MD ELECTRONICALLY SIGNED 12/19/2011 8:46

## 2014-12-26 NOTE — Op Note (Signed)
PATIENT NAME:  [name], [name] MR#:  [id number] DATE OF BIRTH:  1955/09/15  DATE OF PROCEDURE:  11/26/2011  PREOPERATIVE DIAGNOSES:  1. Peripheral arterial disease with claudication bilateral lower extremities.  2. Chronic kidney disease.  3. Hypertension.  4. Tobacco dependence.   POSTOPERATIVE DIAGNOSES:  1. Peripheral arterial disease with claudication bilateral lower extremities.  2. Chronic kidney disease.  3. Hypertension.  4. Tobacco dependence.   PROCEDURES:  1. Catheter placement into right popliteal artery from left femoral approach.  2. Aortogram and selective right lower extremity angiogram.  3. Percutaneous transluminal angioplasty of entire SFA and above-knee popliteal artery with 5-mm diameter angioplasty balloon.  4. Percutaneous transluminal angioplasty of proximal SFA with 6-mm diameter angioplasty balloon.  5. Self-expanding stent placement to distal SFA and above-knee popliteal artery for greater than 50% residual stenosis and dissection after angioplasty.  6. StarClose closure device left femoral artery.   SURGEON: Algernon Huxley, M.D.   ANESTHESIA: Local with moderate conscious sedation.   ESTIMATED BLOOD LOSS: 25 mL.   FLUOROSCOPY TIME: 7.5 minutes.   CONTRAST USED: 60 mL.    INDICATION FOR PROCEDURE: This is a 59 year old African American female with severe bilateral lower extremity claudication. We saw her several months ago and recommended a trial of conservative management and lifestyle modifications. She has had worsening symptoms. She has severe claudication impairing her abilities to perform any activities and desires intervention for revascularization. We discussed with her that any attempt at revascularization may actually increase her risk of limb loss, and any intervention will not be durable as long as she continues smoking. We discussed that if she continued smoking, her risk of ultimate limb loss was extremely high in both lower extremities as her  ABIs were in 0.5 to 0.6 range bilaterally.  Despite these cautions she did desire to proceed with revascularization at this point, which was reasonable given the fact that she was severely limited. Risks and benefits were discussed. Informed consent was obtained.   DESCRIPTION OF PROCEDURE: The patient was brought to the vascular interventional radiology suite. Groins were shaved and prepped and a sterile surgical field was created. The left femoral head was localized with fluoroscopy and the left femoral artery was accessed without difficulty with a Seldinger needle. A 3J wire and 5 French sheath were placed. Pigtail catheter was placed in the aorta at the L1 level. AP aortogram was performed. This showed some ectasia of the common iliac artery, irregularity within the aortoiliac segments without flow-limiting stenosis. The renal vessels appeared patent. I then hooked the aortic bifurcation and advanced to the right femoral head and selective right lower extremity angiogram was then performed. This showed an occlusion of the SFA at its origin with reconstitution distally just below Hunter's canal with some disease in the above-knee popliteal artery. She then had had two-vessel runoff distally. The patient was given 3500 units of intravenous heparin systemic anticoagulation. A 6 French Ansel sheath was placed over a Terumo advantage wire. I was able to gain access to the superficial femoral artery with minimal difficulty with the help of a Kumpe catheter and Terumo advantage wire, and crossed the lesion without difficulty confirming intraluminal flow in the popliteal artery at the level of the knee. I then replaced the Terumo advantage wire. A  5-mm diameter x 25- mm [place] balloon was inflated from just above the knee to the common femoral artery. Waists were taken in multiple locations along the way. In the distal superficial femoral artery there was a  dissection with greater than 50% residual stenosis. This was  treated with a 6-mm diameter self-expanding stent ironed out with a 5-mm balloon with good angiographic result. At the origin of the superficial femoral artery there was still an approximate 70% stenosis after the 5-mm diameter angioplasty balloon. I then took a 6-mm diameter angioplasty balloon to burst pressure. At this point there was still some residual stenosis in the 40% range but at the origin. This was a no-stent zone particularly in a patient without immediate risk of limb loss, so I elected to stop at this point. The sheath was pulled back to the ipsilateral external iliac artery and oblique arteriogram was performed. StarClose closure device was deployed in the usual fashion with excellent hemostatic result.   The patient tolerated the procedure well and was taken to the recovery room in stable condition.    ____________________________ Algernon Huxley, MD jsd:bjt D: 11/26/2011 10:20:40 ET T: 11/26/2011 10:40:05 ET JOB#: [id number]  cc: Algernon Huxley, MD, <Dictator> Algernon Huxley MD ELECTRONICALLY SIGNED 11/29/2011 16:21

## 2014-12-26 NOTE — H&P (Signed)
PATIENT NAME:  [name], CUMPIAN MR#:  [id number] DATE OF BIRTH:  [birth year]  DATE OF ADMISSION:  12/12/2011  PRIMARY CARE PHYSICIAN: Dr. Duard Brady   CHIEF COMPLAINT: Bright red blood per rectum.   HISTORY OF PRESENT ILLNESS: Patient is a 59 year old African American female with history of chronic bleeding bright red blood per rectum due to severe hemorrhoids who reports that she has intermittent bleeding over the past few years but since yesterday her bleeding got worse where she was having continuous bright red blood per rectum, started around 4:00 p.m. and then stopped this morning. Patient started feeling dizzy. Came to the ED. Initially blood pressure was in the 60s. Hemoglobin was noted to be at 7.7. We were asked to admit the patient. She reports that she has not had any abdominal pain, nausea, vomiting or diarrhea. She has not had any hematemesis. Also reports intermittent passage of dark colored clots. She reports otherwise her appetite is okay. She did have a colonoscopy in May 2012 which showed a polyp in the mid sigmoid region. Internal hemorrhoids were also noted per GI.   PAST MEDICAL HISTORY:  1. History of coronary artery disease with history of myocardial infarction, noted to have 100% RCA lesion, moderate disease of the left and normal renal arteries in 2011 which is medically managed.  2. Hypertension.  3. Hyperlipidemia.  4. Hypertriglyceridemia.  5. Tobacco abuse.  6. History of peripheral vascular disease, status post right lower extremity arterial stent.  7. Status post cholecystectomy.  8. Status post hysterectomy.   ALLERGIES: She has no allergies. She reports intolerance to aspirin but is on aspirin at home.   MEDICATIONS: Patient is not sure of all her medications. She is on Lipitor, aspirin, BuSpar and that she is on two other medications.   SOCIAL HISTORY: Smokes 1 pack per day. Occasional alcohol. No drug use.   FAMILY HISTORY: Positive for coronary artery  disease and diabetes.    REVIEW OF SYSTEMS: CONSTITUTIONAL: Denies any fevers. Complains of fatigue today and weakness. No pain. No weight loss. No weight gain. EYES: No blurred or double vision. No pain. No redness. No inflammation. No glaucoma. No cataracts. ENT: No tinnitus. No ear pain. No hearing loss. No seasonal or year-round allergies. No epistaxis. No nasal discharge. No snoring. No postnasal drip. No difficulty swallowing. RESPIRATORY: No cough. No wheezing. No chronic obstructive pulmonary disease. No tuberculosis. No pneumonia. CARDIOVASCULAR: No chest pain. No orthopnea. No edema. No dyspnea on exertion. No syncope. GASTROINTESTINAL: No nausea, vomiting, or diarrhea. No abdominal pain. No hematemesis. Has chronic rectal bleeding. No changes in bowel habits. GENITOURINARY: Denies any dysuria, hematuria, renal calculus, or frequency. ENDO: Denies any polyuria, nocturia, or thyroid problems. No increase in sweating, heat or cold intolerance. HEME/LYMPH: Denies anemia, easy bruisability, or bleeding. SKIN: No acne. No rash. No changes mole, hair or skin. MUSCULOSKELETAL: No pain in neck, back, or shoulder. NEURO: No numbness. No cerebrovascular accident. No transient ischemic attack. PSYCHIATRIC: No anxiety. No insomnia. No ADD. No OCD.   PHYSICAL EXAMINATION:  VITAL SIGNS: Temperature 98.1, pulse 77, respirations 18, blood pressure 90/54.   GENERAL: Patient is currently not in any acute distress.   HEENT: Head atraumatic, normocephalic. Pupils equally round, reactive to light and accommodation. Extraocular movements intact. Oropharynx is clear.   NECK: There is no JVD. No carotid bruits.   CARDIOVASCULAR: Regular rate and rhythm. No murmurs, rubs, clicks, or gallops. PMI is not displaced.   LUNGS: Clear to auscultation bilaterally without any  rales, rhonchi, wheezing.   ABDOMEN: Soft, nontender, nondistended. Positive bowel sounds x4. There is no hepatosplenomegaly.   EXTREMITIES: No  clubbing, cyanosis, edema.   SKIN: No rash.   LYMPHATICS: No lymph nodes palpable.   VASCULAR: Diminished DP, PT pulses in both lower extremities.   PSYCHIATRIC: Not anxious or depressed.   RECTAL: Very large external hemorrhoids, currently not bleeding.   NEUROLOGICAL: Awake, alert, oriented x3. No focal deficits.   LABORATORY, DIAGNOSTIC AND RADIOLOGICAL DATA: Glucose 87, BUN 22, creatinine 1.56, sodium 139, potassium 3.8, chloride 110, CO2 21, calcium 8.6, WBC 5.0, hemoglobin 7.7, platelet count 201.   ASSESSMENT AND PLAN: Patient is a 59 year old African American female with history of hemorrhoids with chronic bleeding which is normally minimal over the past few years, now since yesterday has more severe bright red blood per rectum, hemoglobin 7.5. Her hemoglobin was normal in December. Also is hypotensive and blood pressure in the 60s at this time.  1. Acute GI bleeding, likely due to her hemorrhoids. At this time will go ahead and transfuse the patient. Will place her on observation. Will get GI to evaluate in the morning. Has large external hemorrhoids, may need a hemorrhoidectomy as outpatient. Will need surgical evaluation. Her bleeding has stopped.  2. Hypotension, likely due to anemia as well as concurrent antihypertensives. Will give her IV fluids, follow her blood pressure.  3. Coronary artery disease. Hold aspirin.  4. History of hypertension. Will obtain her home medication list.  5. Elevated creatinine, which she had elevated creatinine in the past. Will monitor renal function.   TIME SPENT: 35 minutes.  ____________________________ Erika Mosses Posey Pronto, MD shp:cms D: 12/12/2011 21:15:07 ET T: 12/13/2011 07:57:30 ET  JOB#: [id number] cc: Jazelle Achey H. Posey Pronto, MD, <Dictator> Melinda Crutch. Candiss Norse, MD Alric Seton MD ELECTRONICALLY SIGNED 12/15/2011 16:35

## 2014-12-26 NOTE — Consult Note (Signed)
PATIENT NAME:  [name], [name] MR#:  [id number] DATE OF BIRTH:  02/07/1956  DATE OF CONSULTATION:  12/13/2011  REFERRING PHYSICIAN:  Duard Brady, MD CONSULTING PHYSICIAN:  Jill Side, MD  REASON FOR CONSULTATION: Rectal bleeding.   HISTORY OF PRESENT ILLNESS: This is a 59 year old African American female according to whom she has been having rectal bleeding for almost 15 to 20 years. Apparently she had hemorrhoid surgery many, many years ago and has been noticing bright red blood per rectum on and off since then. For the last 2 or 3 days, the bleeding has been severe and more continuous. She came to the emergency room. She was hypotensive with a hemoglobin of 7.7. She is describing blood as bright red with blood clots. She denies any upper abdominal pain, nausea, vomiting, or hematemesis. No melena was reported by her as well. She denies using any over-the-counter nonsteroidals.   PAST MEDICAL HISTORY:  1. History of coronary artery disease with history of prior myocardial infarction.  2. Hypertension. 3. Hyperlipidemia. 4. Hypertriglyceridemia. 5. History of tobacco abuse. 6. Peripheral vascular disease.  7. Cholecystectomy.  8. Hysterectomy.   ALLERGIES: No known drug allergies.   MEDICATIONS: Lipitor, aspirin, and BuSpar.  SOCIAL HISTORY: She smokes 1 pack a day.   FAMILY HISTORY: Positive for coronary artery disease and diabetes.  REVIEW OF SYSTEMS: Negative except for what is mentioned in the History of Present Illness. She was feeling weak and dizzy on arrival. She denies significant shortness of breath or chest pain.   PHYSICAL EXAMINATION:   GENERAL: Fairly well built female who does not appear to be in any acute distress, quite awake and alert.   VITAL SIGNS: Temperature 98.1, pulse 61, respirations 18, blood pressure 143/77.   HEENT: Unremarkable.   NECK: Veins are flat.   LUNGS: Grossly clear to auscultation.   CARDIOVASCULAR: Regular rate and rhythm. No  gallops or murmurs.   ABDOMEN: Slightly distended but soft. Bowel sounds are positive. Nontender. No rebound or guarding was noted.   NEUROLOGIC: Examination appears to be unremarkable.  LABS/STUDIES: White cell count is 5 and hemoglobin is 7.7. After transfusion it is 9.3. Platelet count is 201 and MCV is normal at 88. Creatinine is 1.56.   ASSESSMENT AND PLAN: The patient is with hematochezia, most likely lower gastrointestinal bleeding. Clinically it appears to be diverticular bleeding, although colonoscopy about a year ago revealed only a few small polyps and internal hemorrhoids. Bleeding from an internal hemorrhoid remains a possibility. The patient seems to be better. Her hemoglobin and hematocrit seem to be more stable. I have had a discussion with the patient and I would recommend repeat colonoscopy for further evaluation prior to any surgical intervention. She is in full agreement. As the patient has had regular food all day today, a colonoscopy cannot be performed tomorrow. I would switch her to clear liquid diet and will re-evaluate her tomorrow and make further plans. Please follow hemoglobin and hematocrit and transfuse as needed.       Further recommendations to follow. Upper GI bleeding is very unlikely, although due to the fact the patient was hypertensive on admission I will start her on a proton pump inhibitors. We will follow.  ____________________________ Jill Side, MD si:slb D: 12/13/2011 18:43:03 ET T: 12/14/2011 08:35:35 ET JOB#: [id number]  cc: Jill Side, MD, <Dictator> Jill Side MD ELECTRONICALLY SIGNED 12/14/2011 16:34

## 2014-12-26 NOTE — Consult Note (Signed)
Chief Complaint:   Subjective/Chief Complaint Feels OK. NO new complaints. Colonoscopy and surgical notes reviewed. Agree with surgical plans. No further GI recommendations. Will sign off. Please call us if needed. Thanks.   VITAL SIGNS/ANCILLARY NOTES: **Vital Signs.:   15-Apr-13 17:39   Vital Signs Type Q 4hr   Temperature Temperature (F) 98   Celsius 36.6   Temperature Source oral   Pulse Pulse 64   Respirations Respirations 18   Systolic BP Systolic BP 340   Diastolic BP (mmHg) Diastolic BP (mmHg) 74   Mean BP 88   BP Source Dinamap   Pulse Ox % Pulse Ox % 98   Pulse Ox Activity Level  At rest   Oxygen Delivery Room Air/ 21 %   Routine Hem:  15-Apr-13 05:40    WBC (CBC) 6.0   RBC (CBC) 3.39   Hemoglobin (CBC) 9.9   Hematocrit (CBC) 30.2   Platelet Count (CBC) 194   MCV 89   MCH 29.3   MCHC 32.9   RDW 14.9  Routine Chem:  15-Apr-13 05:40    Glucose, Serum 90   BUN 13   Creatinine (comp) 1.35   Sodium, Serum 135   Potassium, Serum 3.9   Chloride, Serum 99   CO2, Serum 27   Calcium (Total), Serum 9.0   Anion Gap 9   Osmolality (calc) 270   eGFR (African American) 51   eGFR (Non-African American) 44  Routine Hem:  15-Apr-13 05:40    Neutrophil % 63.9   Lymphocyte % 27.1   Monocyte % 6.8   Eosinophil % 1.8   Basophil % 0.4   Neutrophil # 3.9   Lymphocyte # 1.6   Monocyte # 0.4   Eosinophil # 0.1   Basophil # 0.0   Electronic Signatures: Jill Side (MD)  (Signed 15-Apr-13 18:21)  Authored: Chief Complaint, VITAL SIGNS/ANCILLARY NOTES, Lab Results   Last Updated: 15-Apr-13 18:21 by Jill Side (MD)

## 2014-12-26 NOTE — Op Note (Signed)
PATIENT NAME:  [name], [name] MR#:  [id number] DATE OF BIRTH:  09/23/1955  DATE OF PROCEDURE:  12/24/2011  PREOPERATIVE DIAGNOSES:  1. Peripheral arterial disease with claudication, bilateral lower extremities status post right lower extremity revascularization with improvement.  2. Hypertension.  3. Tobacco dependence.  4. History of myocardial infarction.  POSTOPERATIVE DIAGNOSES:  1. Peripheral arterial disease with claudication, bilateral lower extremities status post right lower extremity revascularization with improvement.  2. Hypertension.  3. Tobacco dependence.  4. History of myocardial infarction.  PROCEDURES PERFORMED: 1. Catheter placement into left tibioperoneal trunk from right femoral artery.  2. Aortogram and selective left lower extremity angiogram.  3. Percutaneous transluminal angioplasty of entire SFA and popliteal artery with 5 mm diameter angioplasty balloon.  4. Percutaneous transluminal angioplasty of tibioperoneal trunk with 4 mm diameter angioplasty balloon.  5. Self-expanding stent placement to the distal superficial femoral artery and popliteal artery with 6 mm diameter self-expanding stent for greater than 50% residual stenosis and dissection after angioplasty.  6. StarClose closure device, right femoral artery.   SURGEON: Algernon Huxley, M.D.   ANESTHESIA: Local with moderate conscious sedation.   ESTIMATED BLOOD LOSS: 25 mL.  CONTRAST USED: 70 mL Visipaque.   FLUOROSCOPY TIME: Approximately 10 minutes.   INDICATION FOR PROCEDURE: This is a 59 year old African female who I know for her peripheral vascular disease. I have already treated her right lower extremity with good results. She comes back for an attempt at treatment of her left lower extremity to improve her short distance claudication which is lifestyle limiting. Risks and benefits were discussed and informed consent was obtained.   DESCRIPTION OF PROCEDURE: The patient was brought to the  vascular interventional radiology suite. Groins were shaved and prepped and a sterile surgical field was created. The right femoral head was localized with fluoroscopy and the right femoral artery was accessed without difficulty with a Seldinger needle. A 3J wire and 5 French sheath were placed. A pigtail catheter was placed in the aorta at the L1 level. Multiple renal vessels were seen; it looked like two on each side. These were reasonably small but had decent flow. I then hooked the aortic bifurcation and advanced to the left femoral head and selective left lower extremity angiogram was then performed. This demonstrated flush SFA occlusion on the left with reconstitution distally in the popliteal artery. The popliteal artery and tibioperoneal trunk had some stenotic disease and then there was two-vessel runoff distally. The patient was intravenously heparinized. A 6 French Ansel sheath was placed over a Terumo Advantage wire. I was able to gain access into the superficial femoral artery with a Kumpe catheter and the Terumo Advantage wire. I was able to cross this occlusion with minimal difficulty and confirmed intraluminal flow in the popliteal artery distally. The 0.035 wire was then replaced. A 5 mm diameter angioplasty balloon was inflated from the below-knee popliteal artery up to the common femoral artery. Wastes were taken which resolved. The proximal mid superficial and femoral artery were patent with good flow, but the distal superficial femoral artery and popliteal artery, at the re-entry point, had a spiral dissection with greater than 50% residual stenosis and there was stenosis down into the tibioperoneal trunk. A 6 mm diameter self-expanding stent was placed in the popliteal artery and up to the very distal superficial femoral artery encompassing this area of dissection from reentry and a 4 mm diameter angioplasty balloon was used to inflate in the tibioperoneal trunk where there was residual  narrowing  as well. The 5 mm diameter balloon was used to iron out the stent. Completion angiogram following this showed markedly improved flow with good flow through the stent and preserved runoff distally. There was spasm in the posterior tibial artery where the 0.035 wire had been parked, but the peroneal artery was patent. At this point, I elected to terminate the procedure. The sheath was pulled back to the ipsilateral external iliac artery and     oblique arteriogram was performed. StarClose closure device was deployed in the usual fashion with excellent hemostatic result. The patient tolerated the procedure well and was taken to the recovery room in stable condition.  ____________________________ Algernon Huxley, MD jsd:slb D: 12/24/2011 10:38:42 ET T: 12/24/2011 11:19:29 ET JOB#: [id number]  cc: Algernon Huxley, MD, <Dictator> Taran K. Candiss Norse, MD Algernon Huxley MD ELECTRONICALLY SIGNED 12/24/2011 16:16

## 2014-12-26 NOTE — Consult Note (Signed)
Steady bleeding from bowel prep last night. Colonoscopy to cecum showed bleeding internal hemorrhoids. Colon itself was completely normal. Anusol supp bid ordered. Full liquid ordered. Likely need either hemorrhoidal banding or hemorrhoidectomry. Thanks.  Electronic Signatures: Verdie Shire (MD)  (Signed on 14-Apr-13 07:55)  Authored  Last Updated: 14-Apr-13 07:55 by Verdie Shire (MD)

## 2015-01-22 ENCOUNTER — Emergency Department: Payer: Medicare Other

## 2015-01-22 ENCOUNTER — Encounter: Payer: Self-pay | Admitting: *Deleted

## 2015-01-22 ENCOUNTER — Inpatient Hospital Stay: Payer: Medicare Other

## 2015-01-22 ENCOUNTER — Inpatient Hospital Stay
Admission: EM | Admit: 2015-01-22 | Discharge: 2015-02-04 | DRG: 166 | Disposition: A | Discharge status: Home / Self Care | Payer: Medicare Other | Attending: Internal Medicine | Admitting: Internal Medicine

## 2015-01-22 DIAGNOSIS — J850 Gangrene and necrosis of lung: Secondary | ICD-10-CM | POA: Diagnosis not present

## 2015-01-22 DIAGNOSIS — F17210 Nicotine dependence, cigarettes, uncomplicated: Secondary | ICD-10-CM | POA: Diagnosis not present

## 2015-01-22 DIAGNOSIS — Z720 Tobacco use: Secondary | ICD-10-CM | POA: Diagnosis not present

## 2015-01-22 DIAGNOSIS — J189 Pneumonia, unspecified organism: Secondary | ICD-10-CM | POA: Diagnosis not present

## 2015-01-22 DIAGNOSIS — J449 Chronic obstructive pulmonary disease, unspecified: Secondary | ICD-10-CM | POA: Diagnosis not present

## 2015-01-22 DIAGNOSIS — J17 Pneumonia in diseases classified elsewhere: Secondary | ICD-10-CM | POA: Diagnosis not present

## 2015-01-22 DIAGNOSIS — R0602 Shortness of breath: Secondary | ICD-10-CM

## 2015-01-22 DIAGNOSIS — Z7982 Long term (current) use of aspirin: Secondary | ICD-10-CM | POA: Diagnosis not present

## 2015-01-22 DIAGNOSIS — R05 Cough: Secondary | ICD-10-CM

## 2015-01-22 DIAGNOSIS — I739 Peripheral vascular disease, unspecified: Secondary | ICD-10-CM | POA: Diagnosis not present

## 2015-01-22 DIAGNOSIS — Z8249 Family history of ischemic heart disease and other diseases of the circulatory system: Secondary | ICD-10-CM | POA: Diagnosis not present

## 2015-01-22 DIAGNOSIS — R059 Cough, unspecified: Secondary | ICD-10-CM

## 2015-01-22 DIAGNOSIS — Z886 Allergy status to analgesic agent status: Secondary | ICD-10-CM

## 2015-01-22 DIAGNOSIS — R918 Other nonspecific abnormal finding of lung field: Secondary | ICD-10-CM | POA: Diagnosis present

## 2015-01-22 DIAGNOSIS — K625 Hemorrhage of anus and rectum: Secondary | ICD-10-CM

## 2015-01-22 DIAGNOSIS — R599 Enlarged lymph nodes, unspecified: Secondary | ICD-10-CM | POA: Diagnosis not present

## 2015-01-22 DIAGNOSIS — I252 Old myocardial infarction: Secondary | ICD-10-CM | POA: Diagnosis not present

## 2015-01-22 DIAGNOSIS — F17200 Nicotine dependence, unspecified, uncomplicated: Secondary | ICD-10-CM | POA: Diagnosis not present

## 2015-01-22 DIAGNOSIS — D649 Anemia, unspecified: Secondary | ICD-10-CM | POA: Diagnosis not present

## 2015-01-22 DIAGNOSIS — D491 Neoplasm of unspecified behavior of respiratory system: Secondary | ICD-10-CM | POA: Diagnosis not present

## 2015-01-22 DIAGNOSIS — Z9889 Other specified postprocedural states: Secondary | ICD-10-CM | POA: Diagnosis not present

## 2015-01-22 DIAGNOSIS — Z95828 Presence of other vascular implants and grafts: Secondary | ICD-10-CM

## 2015-01-22 DIAGNOSIS — A184 Tuberculosis of skin and subcutaneous tissue: Secondary | ICD-10-CM | POA: Diagnosis not present

## 2015-01-22 DIAGNOSIS — I2510 Atherosclerotic heart disease of native coronary artery without angina pectoris: Secondary | ICD-10-CM | POA: Diagnosis not present

## 2015-01-22 DIAGNOSIS — I10 Essential (primary) hypertension: Secondary | ICD-10-CM | POA: Diagnosis present

## 2015-01-22 DIAGNOSIS — J45909 Unspecified asthma, uncomplicated: Secondary | ICD-10-CM | POA: Diagnosis not present

## 2015-01-22 DIAGNOSIS — J9811 Atelectasis: Secondary | ICD-10-CM | POA: Diagnosis present

## 2015-01-22 DIAGNOSIS — R0789 Other chest pain: Secondary | ICD-10-CM | POA: Diagnosis not present

## 2015-01-22 DIAGNOSIS — Z79899 Other long term (current) drug therapy: Secondary | ICD-10-CM | POA: Diagnosis not present

## 2015-01-22 DIAGNOSIS — R0781 Pleurodynia: Secondary | ICD-10-CM | POA: Diagnosis not present

## 2015-01-22 DIAGNOSIS — J852 Abscess of lung without pneumonia: Secondary | ICD-10-CM | POA: Insufficient documentation

## 2015-01-22 DIAGNOSIS — J181 Lobar pneumonia, unspecified organism: Secondary | ICD-10-CM | POA: Diagnosis not present

## 2015-01-22 DIAGNOSIS — R222 Localized swelling, mass and lump, trunk: Secondary | ICD-10-CM | POA: Diagnosis not present

## 2015-01-22 DIAGNOSIS — K429 Umbilical hernia without obstruction or gangrene: Secondary | ICD-10-CM | POA: Diagnosis not present

## 2015-01-22 HISTORY — DX: Nicotine dependence, unspecified, uncomplicated: F17.200

## 2015-01-22 HISTORY — DX: Essential (primary) hypertension: I10

## 2015-01-22 HISTORY — DX: Acute myocardial infarction, unspecified: I21.9

## 2015-01-22 HISTORY — DX: Unspecified hemorrhoids: K64.9

## 2015-01-22 HISTORY — DX: Unspecified asthma, uncomplicated: J45.909

## 2015-01-22 HISTORY — DX: Peripheral vascular disease, unspecified: I73.9

## 2015-01-22 HISTORY — DX: Chronic obstructive pulmonary disease, unspecified: J44.9

## 2015-01-22 LAB — CBC
HCT: 34 % — ABNORMAL LOW (ref 35.0–47.0)
HEMOGLOBIN: 11.3 g/dL — AB (ref 12.0–16.0)
MCH: 31.3 pg (ref 26.0–34.0)
MCHC: 33.1 g/dL (ref 32.0–36.0)
MCV: 94.5 fL (ref 80.0–100.0)
Platelets: 272 10*3/uL (ref 150–440)
RBC: 3.6 MIL/uL — ABNORMAL LOW (ref 3.80–5.20)
RDW: 15.1 % — ABNORMAL HIGH (ref 11.5–14.5)
WBC: 12.2 10*3/uL — AB (ref 3.6–11.0)

## 2015-01-22 LAB — BASIC METABOLIC PANEL
Anion gap: 11 (ref 5–15)
BUN: 15 mg/dL (ref 6–20)
CO2: 23 mmol/L (ref 22–32)
CREATININE: 1.05 mg/dL — AB (ref 0.44–1.00)
Calcium: 9.2 mg/dL (ref 8.9–10.3)
Chloride: 100 mmol/L — ABNORMAL LOW (ref 101–111)
GFR calc non Af Amer: 57 mL/min — ABNORMAL LOW (ref >60)
GLUCOSE: 106 mg/dL — AB (ref 65–99)
Potassium: 3.7 mmol/L (ref 3.5–5.1)
Sodium: 134 mmol/L — ABNORMAL LOW (ref 135–145)

## 2015-01-22 LAB — TROPONIN I: Troponin I: <0.03 ng/mL (ref <0.031)

## 2015-01-22 MED ORDER — LOSARTAN POTASSIUM 50 MG PO TABS
50.0000 mg | ORAL_TABLET | Freq: Every day | ORAL | Status: DC
  Administered 2015-01-22 – 2015-02-02 (×12): 50 mg via ORAL
  Filled 2015-01-22 (×13): qty 1

## 2015-01-22 MED ORDER — IOHEXOL 300 MG/ML  SOLN
100.0000 mL | Freq: Once | INTRAMUSCULAR | Status: AC | PRN
  Administered 2015-01-22: 13:00:00 100 mL via INTRAVENOUS

## 2015-01-22 MED ORDER — SODIUM CHLORIDE 0.9 % IV SOLN
INTRAVENOUS | Status: DC
  Administered 2015-01-22 – 2015-01-30 (×14): via INTRAVENOUS

## 2015-01-22 MED ORDER — ACETAMINOPHEN 325 MG PO TABS
650.0000 mg | ORAL_TABLET | Freq: Four times a day (QID) | ORAL | Status: DC | PRN
  Administered 2015-01-22 – 2015-01-27 (×2): 650 mg via ORAL
  Filled 2015-01-22 (×3): qty 2

## 2015-01-22 MED ORDER — METOPROLOL TARTRATE 100 MG PO TABS
100.0000 mg | ORAL_TABLET | Freq: Every day | ORAL | Status: DC
  Administered 2015-01-22 – 2015-02-02 (×10): 100 mg via ORAL
  Filled 2015-01-22 (×14): qty 1

## 2015-01-22 MED ORDER — SENNOSIDES-DOCUSATE SODIUM 8.6-50 MG PO TABS
1.0000 | ORAL_TABLET | Freq: Every evening | ORAL | Status: DC | PRN
  Administered 2015-01-23 – 2015-01-24 (×2): 1 via ORAL
  Filled 2015-01-22 (×2): qty 1

## 2015-01-22 MED ORDER — MORPHINE SULFATE 2 MG/ML IJ SOLN
1.0000 mg | INTRAMUSCULAR | Status: DC | PRN
  Administered 2015-01-23: 05:00:00 1 mg via INTRAVENOUS
  Filled 2015-01-22: qty 1

## 2015-01-22 MED ORDER — ACETAMINOPHEN 650 MG RE SUPP: 650.0000 mg | Freq: Four times a day (QID) | RECTAL | Status: DC | PRN

## 2015-01-22 MED ORDER — ONDANSETRON HCL 4 MG/2ML IJ SOLN: 4.0000 mg | Freq: Four times a day (QID) | INTRAMUSCULAR | Status: DC | PRN

## 2015-01-22 MED ORDER — HYDROCODONE-ACETAMINOPHEN 5-325 MG PO TABS
1.0000 | ORAL_TABLET | ORAL | Status: DC | PRN
  Administered 2015-01-22: 14:00:00 1 via ORAL
  Administered 2015-01-23 – 2015-01-27 (×15): 2 via ORAL
  Administered 2015-01-29 – 2015-01-31 (×2): 1 via ORAL
  Administered 2015-01-31 – 2015-02-01 (×4): 2 via ORAL
  Filled 2015-01-22: qty 1
  Filled 2015-01-22 (×13): qty 2
  Filled 2015-01-22: qty 1
  Filled 2015-01-22 (×7): qty 2

## 2015-01-22 MED ORDER — VANCOMYCIN HCL IN DEXTROSE 1-5 GM/200ML-% IV SOLN
1000.0000 mg | INTRAVENOUS | Status: DC
  Administered 2015-01-22 – 2015-01-25 (×5): 1000 mg via INTRAVENOUS
  Filled 2015-01-22 (×6): qty 200

## 2015-01-22 MED ORDER — DEXTROSE 5 % IV SOLN
INTRAVENOUS | Status: AC
  Administered 2015-01-22: 1 g via INTRAVENOUS
  Filled 2015-01-22: qty 10

## 2015-01-22 MED ORDER — DEXTROSE 5 % IV SOLN
500.0000 mg | Freq: Once | INTRAVENOUS | Status: AC
  Administered 2015-01-22: 500 mg via INTRAVENOUS
  Filled 2015-01-22: qty 500

## 2015-01-22 MED ORDER — ATORVASTATIN CALCIUM 20 MG PO TABS
80.0000 mg | ORAL_TABLET | ORAL | Status: DC
  Administered 2015-01-22 – 2015-02-04 (×14): 80 mg via ORAL
  Filled 2015-01-22 (×16): qty 4

## 2015-01-22 MED ORDER — KETOROLAC TROMETHAMINE 30 MG/ML IJ SOLN
INTRAMUSCULAR | Status: AC
  Administered 2015-01-22: 30 mg via INTRAVENOUS
  Filled 2015-01-22: qty 1

## 2015-01-22 MED ORDER — DEXTROSE 5 % IV SOLN
1.0000 g | Freq: Once | INTRAVENOUS | Status: AC
  Administered 2015-01-22: 1 g via INTRAVENOUS

## 2015-01-22 MED ORDER — KETOROLAC TROMETHAMINE 30 MG/ML IJ SOLN
30.0000 mg | Freq: Once | INTRAMUSCULAR | Status: AC
  Administered 2015-01-22: 30 mg via INTRAVENOUS

## 2015-01-22 MED ORDER — DEXTROSE 5 % IV SOLN: 500.0000 mg | Freq: Once | INTRAVENOUS | Status: DC

## 2015-01-22 MED ORDER — PIPERACILLIN-TAZOBACTAM 3.375 G IVPB
3.3750 g | Freq: Three times a day (TID) | INTRAVENOUS | Status: DC
  Filled 2015-01-22 (×4): qty 50

## 2015-01-22 MED ORDER — IOHEXOL 240 MG/ML SOLN
25.0000 mL | INTRAMUSCULAR | Status: AC
  Administered 2015-01-22 (×2): 25 mL via ORAL

## 2015-01-22 MED ORDER — ONDANSETRON HCL 4 MG PO TABS: 4.0000 mg | ORAL_TABLET | Freq: Four times a day (QID) | ORAL | Status: DC | PRN

## 2015-01-22 MED ORDER — FERROUS SULFATE 325 (65 FE) MG PO TABS
325.0000 mg | ORAL_TABLET | Freq: Every day | ORAL | Status: DC
  Administered 2015-01-22 – 2015-02-04 (×14): 325 mg via ORAL
  Filled 2015-01-22 (×14): qty 1

## 2015-01-22 MED ORDER — VANCOMYCIN HCL IN DEXTROSE 1-5 GM/200ML-% IV SOLN
1000.0000 mg | INTRAVENOUS | Status: DC
  Filled 2015-01-22: qty 200

## 2015-01-22 MED ORDER — TUBERCULIN PPD 5 UNIT/0.1ML ID SOLN
5.0000 [IU] | Freq: Once | INTRADERMAL | Status: AC
  Administered 2015-01-22: 14:00:00 5 [IU] via INTRADERMAL
  Filled 2015-01-22: qty 0.1

## 2015-01-22 MED ORDER — PIPERACILLIN-TAZOBACTAM 3.375 G IVPB
3.3750 g | Freq: Three times a day (TID) | INTRAVENOUS | Status: DC
  Administered 2015-01-22 – 2015-01-27 (×15): 3.375 g via INTRAVENOUS
  Filled 2015-01-22 (×19): qty 50

## 2015-01-22 MED ORDER — NICOTINE POLACRILEX 2 MG MT GUM
2.0000 mg | CHEWING_GUM | OROMUCOSAL | Status: DC | PRN
  Administered 2015-01-23: 11:00:00 2 mg via ORAL
  Filled 2015-01-22 (×2): qty 1

## 2015-01-22 NOTE — ED Notes (Signed)
Pt c/o R sided chest pain starting x 5 days ago and worsening x 2 days ago. Pt states pain is worse when she lies down or coughs. Pt has a productive cough w/ yellow phlegm. Pt states pain is in R chest radiating around R ribs to back.

## 2015-01-22 NOTE — ED Notes (Signed)
Report from [place], [place]

## 2015-01-22 NOTE — ED Notes (Signed)
Pt family at bedside, denies further needs at this time. Pt visible from nurses station.

## 2015-01-22 NOTE — H&P (Signed)
[place] [facility] [place] NAME: [name]    MR#:  [id number]  DATE OF BIRTH:  1956/05/22  DATE OF ADMISSION:  01/22/2015  PRIMARY CARE PHYSICIAN: Leta Baptist  REQUESTING/REFERRING PHYSICIAN: Dr. Corky Downs  CHIEF COMPLAINT:   Chief Complaint  Patient presents with  . Pleurisy    HISTORY OF PRESENT ILLNESS: [name]  is a 59 y.o. female with a known history of nicotine addiction COPD tobacco abuse who presents to the ED with complaint of having right-sided chest pain ongoing's for the past few days. Patient also reports that she has been having productive cough yellow sputum in nature also ongoing for the past few days. She has not had any fevers or chills. Has not had any significant wheezing. But has some shortness of breath. She describes the chest pain as being sharp in nature on the right side of her chest  worse with taking deep breaths. Patient came to the ER with these symptoms and had a chest x-ray which showed a possible lung mass. Therefore she had a CT scan of her chest. 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 Patient denies any significant weight loss. Denies any TB exposure.  PAST MEDICAL HISTORY:   Past Medical History  Diagnosis Date  . 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negative cardiac cath  . COPD (chronic obstructive pulmonary disease)   . Hypertension   . Peripheral vascular disease   . Nicotine addiction     PAST SURGICAL HISTORY:  Past Surgical History  Procedure Laterality Date  . Endovascular stent insertion Bilateral     Legs  . Cardiac surgery    . Hemorrhoid surgery      SOCIAL HISTORY:  History  Substance Use Topics  . Smoking status: Current Every Day Smoker -- 0.50 packs/day    Types: Cigarettes  . Smokeless tobacco: Never Used     Comment: pt recommend to stop smoking 4 min spent  will start on prn nicotine replacment  . Alcohol Use: 0.0 oz/week    0 Standard drinks or equivalent per week     Comment: weekends    FAMILY HISTORY:  Family History  Problem Relation Age of Onset  . Hypertension Mother   . Hypertension Father     DRUG ALLERGIES:  Allergies  Allergen Reactions  . Aspirin Nausea And Vomiting and Other (See Comments)    Pt states aspirin makes her cramp and have to use coated kind.    REVIEW OF SYSTEMS:   CONSTITUTIONAL: No fever, fatigue or weakness.  EYES: No blurred or double vision.  EARS, NOSE, AND THROAT: No tinnitus or ear pain.  RESPIRATORY: Positive cough, positive shortness of breath , wheezing or hemoptysis.  CARDIOVASCULAR: Right sided chest pain, orthopnea, edema.  GASTROINTESTINAL: No nausea, vomiting, diarrhea or abdominal pain.  GENITOURINARY: No dysuria, hematuria.  ENDOCRINE: No polyuria, nocturia,  HEMATOLOGY: No anemia, easy bruising or bleeding SKIN: No rash or lesion. MUSCULOSKELETAL: No joint pain or arthritis.   NEUROLOGIC: No tingling, numbness, weakness.  PSYCHIATRY: No anxiety or depression.   MEDICATIONS AT HOME:  Prior to Admission medications   Medication Sig Start Date End Date Taking? Authorizing Provider  aspirin EC 325 MG tablet Take 325 mg by mouth daily.   Yes Historical Provider, MD  atorvastatin (LIPITOR) 80 MG tablet Take 80 mg  by mouth every morning. 11/15/14  Yes Historical Provider, MD  ferrous sulfate 325 (65 FE) MG tablet Take 325 mg by mouth daily.   Yes Historical Provider, MD  losartan (COZAAR) 50 MG tablet Take 50 mg by mouth daily. 12/09/14  Yes Historical Provider, MD  metoprolol (LOPRESSOR) 50 MG tablet Take 100 mg by mouth daily. 01/19/15  Yes Historical Provider, MD      PHYSICAL EXAMINATION:   VITAL SIGNS: Blood pressure 119/69, pulse 83, temperature 99 F (37.2 C), temperature source Oral, resp. rate 22, height 5\' 4"  (1.626 m), weight 61.689 kg (136 lb), SpO2 95 %.  GENERAL:  59  y.o.-year-old patient lying in the bed with no acute distress.  EYES: Pupils equal, round, reactive to light and accommodation. No scleral icterus. Extraocular muscles intact.  HEENT: Head atraumatic, normocephalic. Oropharynx and nasopharynx clear.  NECK:  Supple, no jugular venous distention. No thyroid enlargement, no tenderness.  LUNGS: Diminished breath sounds bilaterally, no wheezing, rales, positive rhonchi no crepitation. No use of accessory muscles of respiration.  CARDIOVASCULAR: S1, S2 normal. No murmurs, rubs, or gallops.  ABDOMEN: Soft, nontender, nondistended. Bowel sounds present. No organomegaly or mass.  EXTREMITIES: No pedal edema, cyanosis, or clubbing.  NEUROLOGIC: Cranial nerves II through XII are intact. Muscle strength 5/5 in all extremities. Sensation intact. Gait not checked.  PSYCHIATRIC: The patient is alert and oriented x 3.  SKIN: No obvious rash, lesion, or ulcer.   LABORATORY PANEL:   CBC  Recent Labs Lab 01/22/15 0604  WBC 12.2*  HGB 11.3*  HCT 34.0*  PLT 272  MCV 94.5  MCH 31.3  MCHC 33.1  RDW 15.1*   ------------------------------------------------------------------------------------------------------------------  Chemistries   Recent Labs Lab 01/22/15 0604  NA 134*  K 3.7  CL 100*  CO2 23  GLUCOSE 106*  BUN 15  CREATININE 1.05*  CALCIUM 9.2   ------------------------------------------------------------------------------------------------------------------ estimated creatinine clearance is 49.8 mL/min (by C-G formula based on Cr of 1.05). ------------------------------------------------------------------------------------------------------------------ No results for input(s): TSH, T4TOTAL, T3FREE, THYROIDAB in the last 72 hours.  Invalid input(s): FREET3   Coagulation profile No results for input(s): INR, PROTIME in the last 168  hours. ------------------------------------------------------------------------------------------------------------------- No results for input(s): DDIMER in the last 72 hours. -------------------------------------------------------------------------------------------------------------------  Cardiac Enzymes  Recent Labs Lab 01/22/15 0604  TROPONINI <0.03   ------------------------------------------------------------------------------------------------------------------ Invalid input(s): POCBNP  ---------------------------------------------------------------------------------------------------------------  Urinalysis No results found for: COLORURINE, APPEARANCEUR, LABSPEC, PHURINE, GLUCOSEU, HGBUR, BILIRUBINUR, KETONESUR, PROTEINUR, UROBILINOGEN, NITRITE, LEUKOCYTESUR   RADIOLOGY: Dg Chest 2 View  01/22/2015   CLINICAL DATA:  Pleurisy. Right-sided pain, back pain for 1 week. History of COPD.  EXAM: CHEST  2 VIEW  COMPARISON:  10/15/2013  FINDINGS: Within the posterior right upper lobe is a a 4.7 cm rounded masslike opacity, with surrounding ill-defined density. Additional ill-defined density in the right middle lobe. Underlying emphysema again seen. Probable atelectasis or scarring at the left costophrenic angle. The heart size and mediastinal contours are normal, mild atherosclerosis of the thoracic aorta. No pleural effusion. No pneumothorax. There is degenerative change in the spine.  IMPRESSION: Rounded 4.7 since masslike opacity in the right upper lobe, with surrounding ill-defined density. This may reflect pneumonia versus neoplasm. There is additional opacity in the right middle lobe concerning for pneumonia.  Chest CT correlation versus radiographic follow-up recommended. While traditional follow-up for pneumonia would be in 3-4 weeks after course of antibiotics, given the masslike appearance, shorter interval follow-up is recommended in 1-2 weeks.   Electronically Signed   By: Threasa Beards  Ehinger M.D.   On: 01/22/2015 06:42   Ct Chest Wo Contrast  01/22/2015   CLINICAL DATA:  Five day history of right-sided chest pain, worsening over the past 2 days. Right upper lobe lung mass on earlier chest x-ray.  EXAM: CT CHEST WITHOUT CONTRAST  TECHNIQUE: Multidetector CT imaging of the chest was performed following the standard protocol without IV contrast.  COMPARISON:  CT chest 10/14/2012. Two-view chest x-ray earlier same date.  FINDINGS: Lack of intravenous contrast makes the examination less sensitive than it would be otherwise. Severe emphysematous changes throughout both lungs with dense masslike opacity in the right upper lobe. Gas bubbles within the opacity. Extensive bullous changes are present in the right upper lobe in the area of the opacity. Patchy airspace opacities are present more inferiorly in the right lower lobe. Minimal patchy airspace opacities anteriorly in the right middle lobe. Masslike pleural-based opacity in the inferior left lower lobe measuring approximately 1.8 x 2.1 x 2.0 cm.  Multiple bilateral lung nodules, many of which are new since the prior CT. The largest nodule is in the inferior left upper lobe and measures approximately 5 mm (series 3, image 30).  No associated pleural effusions.  No pleural masses.  Normal heart size with extensive 3 vessel coronary atherosclerosis. Severe atherosclerosis involving the thoracic and upper abdominal aorta and their visualized branches without evidence of aneurysm. No visible mediastinal, hilar or axillary lymphadenopathy. Visualized thyroid gland unremarkable.  Visualized upper abdomen unremarkable. 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  IMPRESSION: 1. 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 2. Approximate 2 cm mass in the inferior left lower lobe. 3. Numerous bilateral  pulmonary nodules, the largest approximating 5 mm in the left lower lobe. 4. Severe COPD/emphysema. 5. No visible mediastinal, hilar or axillary lymphadenopathy, allowing for the unenhanced technique.   Electronically Signed   By: Evangeline Dakin M.D.   On: 01/22/2015 08:29    EKG: Orders placed or performed during the hospital encounter of 01/22/15  . ED EKG (<35mins upon arrival to the ED)  . ED EKG (<51mins upon arrival to the ED)  . EKG 12-Lead  . EKG 12-Lead  . EKG 12-Lead  . EKG 12-Lead    IMPRESSION AND PLAN: Patient is a 59 year old African-American female with history of nicotine addiction, COPD presents with right-sided chest pain noted to have a mass as well as possible pneumonia.  #1. Right-sided pleuritic chest pain: Due to right-sided lung mass as well as associated pneumonia. I have discussed the case with Dr. Evangeline Dakin. He feels more a lung mass related process. But reports that TB cannot be ruled out. It could be a rare atypical presentation for TB. At this time will go ahead and place patient in negative isolation room. Static get a stat interferon goal blood test as well as a PPD. And try to obtain AFB in the sputum. I will start her on broad-spectrum antibiotics with vancomycin and Zosyn to cover for possible staph infection. Also radiologist recommends a CT scan of the abdomen chest with IV contrast to further evaluate. He states that the next step would be is to get a PET scan to determine where the best place for biopsy would be. Once we have the CT scan results from her chest and abdomen back. I'll try to see if I can schedule that for Monday. I will also ask pulmonary to  see the patient regarding any further recommendations.  #2. COPD; there is no evidence of acute exacerbation I will place her on Advair and Spiriva. And Combivent inhalers as needed.  #3. Peripheral vascular disease; I will place her aspirin on hold for possible biopsy.  #4. Nicotine addiction  smoking cessation done I have spent 4 minutes with the patient regarding smoking cessation strongly recommended that she stop smoking she will be be offered nicotine replacement while in the hospital      All the records are reviewed and case discussed with ED provider. Management plans discussed with the patient, family and they are in agreement.  CODE STATUS:    Code Status Orders        Start     Ordered   01/22/15 0947  Full code   Continuous     01/22/15 0948       TOTAL TIME TAKING CARE OF THIS PATIENT: 76 minutes  Dustin Flock M.D on 01/22/2015 at 9:59 AM  Between 7am to 6pm - Pager - [phone]  After 6pm go to [email or web address] - password [email or web address] [facility]  [facility]  Office  [phone]  CC: Primary care physician; No primary care provider on file.

## 2015-01-22 NOTE — ED Notes (Addendum)
Pt c/o right-sided rib pain; painful to palpitation.

## 2015-01-22 NOTE — Plan of Care (Signed)
Problem: Discharge Progression Outcomes Goal: Discharge plan in place and appropriate Individualization Outcome: Progressing Address patient as [name]. Patient is from home. Patient is a current smoker. Patient has history of COPD, HTN, MI, PVD, controlled with home medications. Goal: Other Discharge Outcomes/Goals Outcome: Progressing Plan of Care Progressing to Goal:  Patient received pain medication with noted relief. Receiving IV antibiotics. Tolerated diet, no complaints of nausea.

## 2015-01-22 NOTE — ED Notes (Signed)
Pt family at bedside

## 2015-01-22 NOTE — ED Notes (Signed)
EDP at bedside  

## 2015-01-22 NOTE — ED Notes (Signed)
Pt awaiting EDP for results

## 2015-01-22 NOTE — ED Provider Notes (Signed)
[facility] Emergency Department Provider Note  ____________________________________________  Time seen: 7 AM  I have reviewed the triage vital signs and the nursing notes.   HISTORY  Chief Complaint Pleurisy      HPI [name] is a 59 y.o. female who presents with complaints of right-sided sharp chest pain which began approximately 5 days ago and became significantly worse 2 days ago. Movement seems to make the pain worse and holding still improves the pain. She does admit to coughing frequently which is productive of yellow mucus. Patient notes the pain radiates from the right chest to the right lateral back. She was to mild shortness of breath. She has a history of COPD and does admit to smoking. She denies fevers chills     Past Medical History  Diagnosis Date  . 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. COPD (chronic obstructive pulmonary disease)   . Hypertension   . Peripheral vascular disease     There are no active problems to display for this patient.   Past Surgical History  Procedure Laterality Date  . Endovascular stent insertion Bilateral     Legs  . Cardiac surgery    . Hemorrhoid surgery      No current outpatient prescriptions on file.  Allergies Review of patient's allergies indicates no known allergies.  History reviewed. No pertinent family history.  Social History History  Substance Use Topics  . Smoking status: Current Every Day Smoker    Types: Cigarettes  . Smokeless tobacco: Never Used  . Alcohol Use: Yes     Comment: weekends    Review of Systems  Constitutional: Negative for fever. Eyes: Negative for visual changes. ENT: Negative for sore throat. Cardiovascular: Negative for chest pain. Respiratory: Positive for shortness of breath and cough Gastrointestinal: Negative for abdominal pain, vomiting and diarrhea. Genitourinary: Negative for dysuria. Musculoskeletal: Negative for back pain. Skin: Negative for  rash. Neurological: Negative for headaches, focal weakness or numbness.   10-point ROS otherwise negative.  ____________________________________________   PHYSICAL EXAM:  VITAL SIGNS: ED Triage Vitals  Enc Vitals Group     BP 01/22/15 0654 112/51 mmHg     Pulse Rate 01/22/15 0542 93     Resp 01/22/15 0542 16     Temp 01/22/15 0542 99 F (37.2 C)     Temp Source 01/22/15 0542 Oral     SpO2 01/22/15 0542 94 %     Weight 01/22/15 0542 136 lb (61.689 kg)     Height 01/22/15 0542 5\' 4"  (1.626 m)     Head Cir --      Peak Flow --      Pain Score 01/22/15 0544 10     Pain Loc --      Pain Edu? --      Excl. in [place]? --      Constitutional: Alert and oriented. Well appearing and in no distress. Eyes: Conjunctivae are normal. PERRL. Normal extraocular movements. ENT   Head: Normocephalic and atraumatic.   Nose: No congestion/rhinnorhea.   Mouth/Throat: Mucous membranes are moist.   Neck: No stridor. Hematological/Lymphatic/Immunilogical: No cervical lymphadenopathy. Cardiovascular: Normal rate, regular rhythm. Normal and symmetric distal pulses are present in all extremities. No murmurs, rubs, or gallops. Respiratory: Respiratory effort interrupted secondary to pain when patient takes a deep breath. Breath sounds sound clear and equal bilaterally. No wheezes/rales/rhonchi. Patient is very tender to palpation in the right lateral chest Gastrointestinal: Soft and nontender. No distention. There is no CVA tenderness. Genitourinary:  deferred Musculoskeletal: Nontender with normal range of motion in all extremities. No joint effusions.  No lower extremity tenderness nor edema. Neurologic:  Normal speech and language. No gross focal neurologic deficits are appreciated. Speech is normal.  Skin:  Skin is warm, dry and intact. No rash noted. Psychiatric: Mood and affect are normal. Speech and behavior are normal. Patient exhibits appropriate insight and  judgment.  ____________________________________________    LABS (pertinent positives/negatives)  Labs Reviewed  CBC - Abnormal; Notable for the following:    WBC 12.2 (*)    RBC 3.60 (*)    Hemoglobin 11.3 (*)    HCT 34.0 (*)    RDW 15.1 (*)    All other components within normal limits  BASIC METABOLIC PANEL - Abnormal; Notable for the following:    Sodium 134 (*)    Chloride 100 (*)    Glucose, Bld 106 (*)    Creatinine, Ser 1.05 (*)    GFR calc non Af Amer 57 (*)    All other components within normal limits  TROPONIN I     ____________________________________________   EKG  ED ECG REPORT I, Lavonia Drafts, the attending physician, personally viewed and interpreted this ECG.   Date: 01/22/2015  EKG Time: 5:53 AM  Rate: 94  Rhythm: normal sinus rhythm  Axis: Normal axis  Intervals:none  ST&T Change: Nonspecific ST changes   ____________________________________________    RADIOLOGY  X-ray shows concerning for 4.7 cm masslike opacity in the right upper lobe  ____________________________________________   PROCEDURES  Procedure(s) performed: None  Critical Care performed: None ____________________________________________   INITIAL IMPRESSION / ASSESSMENT AND PLAN / ED COURSE  Pertinent labs & imaging results that were available during my care of the patient were reviewed by me and considered in my medical decision making (see chart for details).  X-ray concerning for mass versus pneumonia. We will obtain a CT chest for better characterization ----------------------------------------- 9:10 AM on 01/22/2015 -----------------------------------------  CT concerning for necrotic lung mass. Discussed this with patient. I will give IV antibiotics and admit the patient to the hospital for further workup ____________________________________________   FINAL CLINICAL IMPRESSION(S) / ED DIAGNOSES  Final diagnoses:  Lung mass  Pneumonitis     Lavonia Drafts, MD 01/22/15 [phone]

## 2015-01-22 NOTE — Progress Notes (Signed)
ANTIBIOTIC CONSULT NOTE - INITIAL  Pharmacy Consult for Vancomycin Indication: pneumonia  Allergies  Allergen Reactions   Aspirin Nausea And Vomiting and Other (See Comments)    Pt states aspirin makes her cramp and have to use coated kind.    Patient Measurements: Height: 5\' 4"  (162.6 cm) Weight: 136 lb (61.689 kg) IBW/kg (Calculated) : 54.7   Vital Signs: Temp: 98.2 F (36.8 C) (05/21 1059) Temp Source: Oral (05/21 1059) BP: 138/74 mmHg (05/21 1059) Pulse Rate: 77 (05/21 1059)  Labs:  Recent Labs  01/22/15 0604  WBC 12.2*  HGB 11.3*  PLT 272  CREATININE 1.05*   Estimated Creatinine Clearance: 49.8 mL/min (by C-G formula based on Cr of 1.05). No results for input(s): VANCOTROUGH, VANCOPEAK, VANCORANDOM, GENTTROUGH, GENTPEAK, GENTRANDOM, TOBRATROUGH, TOBRAPEAK, TOBRARND, AMIKACINPEAK, AMIKACINTROU, AMIKACIN in the last 72 hours.   Microbiology: No results found for this or any previous visit (from the past 720 hour(s)).  Medical History: Past Medical History  Diagnosis Date   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negative cardiac cath   COPD (chronic obstructive pulmonary disease)    Hypertension    Peripheral vascular disease    Nicotine addiction     Medications:  Anti-infectives    Start     Dose/Rate Route Frequency Ordered Stop   01/22/15 1400  piperacillin-tazobactam (ZOSYN) IVPB 3.375 g     3.375 g 12.5 mL/hr over 240 Minutes Intravenous 3 times per day 01/22/15 0951     01/22/15 1200  vancomycin (VANCOCIN) IVPB 1000 mg/200 mL premix     1,000 mg 200 mL/hr over 60 Minutes Intravenous Every 18 hours 01/22/15 1124     01/22/15 1000  vancomycin (VANCOCIN) IVPB 1000 mg/200 mL premix  Status:  Discontinued     1,000 mg 200 mL/hr over 60 Minutes Intravenous Every 24 hours 01/22/15 0951 01/22/15 1124   01/22/15 0845  cefTRIAXone (ROCEPHIN) 1 g in dextrose 5 % 50 mL IVPB     1 g 100 mL/hr over 30 Minutes Intravenous  Once 01/22/15 0843 01/22/15 0931   01/22/15  0845  azithromycin (ZITHROMAX) 500 mg in dextrose 5 % 250 mL IVPB     500 mg 250 mL/hr over 60 Minutes Intravenous  Once 01/22/15 0843       Assessment: 59 YO female currently ordered vancomycin and Zosyn for treatment of pneumonia. 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  Ke 0.046 h^-1     T1/2 15.07 h     Vd 43.2 L  Goal of Therapy:  Vancomycin trough level 15-20 mcg/ml  Plan:  Expected duration 7 days with resolution of temperature and/or normalization of WBC  Will initiate vancomycin 1000 mg IV q18h and check a trough before the 5th dose, 5/23 @1730 .  Pharmacy will continue to follow.  [name] 01/22/2015,11:25 AM

## 2015-01-22 NOTE — ED Notes (Signed)
Patient transported to CT 

## 2015-01-23 LAB — CBC
HEMATOCRIT: 31.7 % — AB (ref 35.0–47.0)
Hemoglobin: 10.3 g/dL — ABNORMAL LOW (ref 12.0–16.0)
MCH: 30.8 pg (ref 26.0–34.0)
MCHC: 32.5 g/dL (ref 32.0–36.0)
MCV: 94.7 fL (ref 80.0–100.0)
Platelets: 228 10*3/uL (ref 150–440)
RBC: 3.35 MIL/uL — ABNORMAL LOW (ref 3.80–5.20)
RDW: 14.8 % — ABNORMAL HIGH (ref 11.5–14.5)
WBC: 12 10*3/uL — ABNORMAL HIGH (ref 3.6–11.0)

## 2015-01-23 LAB — BASIC METABOLIC PANEL
ANION GAP: 9 (ref 5–15)
BUN: 16 mg/dL (ref 6–20)
CO2: 24 mmol/L (ref 22–32)
Calcium: 8.4 mg/dL — ABNORMAL LOW (ref 8.9–10.3)
Chloride: 101 mmol/L (ref 101–111)
Creatinine, Ser: 1.11 mg/dL — ABNORMAL HIGH (ref 0.44–1.00)
GFR calc non Af Amer: 53 mL/min — ABNORMAL LOW (ref >60)
Glucose, Bld: 100 mg/dL — ABNORMAL HIGH (ref 65–99)
Potassium: 3.9 mmol/L (ref 3.5–5.1)
SODIUM: 134 mmol/L — AB (ref 135–145)

## 2015-01-23 MED ORDER — OXYCODONE-ACETAMINOPHEN 5-325 MG PO TABS: 1.0000 | ORAL_TABLET | Freq: Three times a day (TID) | ORAL | Status: DC | PRN

## 2015-01-23 MED ORDER — GUAIFENESIN ER 600 MG PO TB12
600.0000 mg | ORAL_TABLET | Freq: Two times a day (BID) | ORAL | Status: DC
  Administered 2015-01-23 – 2015-02-04 (×26): 600 mg via ORAL
  Filled 2015-01-23 (×25): qty 1

## 2015-01-23 MED ORDER — GUAIFENESIN-CODEINE 100-10 MG/5ML PO SOLN
10.0000 mL | ORAL | Status: DC | PRN
  Administered 2015-01-23 – 2015-02-04 (×29): 10 mL via ORAL
  Filled 2015-01-23 (×30): qty 10

## 2015-01-23 NOTE — Plan of Care (Signed)
Problem: Discharge Progression Outcomes Goal: Discharge plan in place and appropriate Individualization  Individualization: Individualization Outcome: Progressing Address patient as [name]. Patient is from home. Patient is a current smoker. Patient has history of COPD, HTN, MI, PVD, controlled with home medications.

## 2015-01-23 NOTE — Plan of Care (Signed)
Problem: Discharge Progression Outcomes Goal: Other Discharge Outcomes/Goals Outcome: Progressing Pt continues to have Rside chest pain.  Pain medications help but she still experiences discomfort when she coughs.  She wants to take cough med w/codeine at 8 PM. Pt ambulates well to  BR.  On abx.  Sputum specimen sent today, but quantiferone TB Gold test has to be performed tomorrow (only done Mon-Fri).  Pt states that she Hasn't had any exposure to TB. Experiencing low grade fever.

## 2015-01-23 NOTE — Consult Note (Signed)
Pulmonary Critical Care  Initial Consult Note   [name] ATF:[id number] DOB: Jun 14, 1956 DOA: 01/22/2015  Referring physician: Dustin Flock, MD PCP: No primary care provider on file.   Chief Complaint: Cavitary Pneumonia  HPI: [name] is a 59 y.o. female with history of CAD MI HTN COPD presented to the hospital with pleuritic chest pain. She states that she had been having a cough with sputum which was yellow in color. She had no hemoptysis noted in the sputum. Patient states that she had some fever noted also. The patient has had congestion. She states that she still does smoke. She had no weight loss noted. She states that she is starting to feel somewhat better now. She admits to ETOH intake couple shots a day. On admission she had a CT scan done which shows a cavitary lesion.She states that she has not had any prior history of MTB exposure. Patient has had no travel outside the area.   Review of Systems:  Constitutional:  No weight loss, +night sweats, +Fevers, +chills, fatigue.  HEENT:  No headaches, nasal congestion, post nasal drip,  Cardio-vascular:  +chest pain, no Orthopnea, PND, swelling in lower extremities  GI:  No heartburn, indigestion, abdominal pain, nausea, vomiting Resp:  +shortness of breath with exertion. +productive cough, No coughing up of blood Skin:  no rash or lesions.  Musculoskeletal:  No joint pain or swelling.   Remainder ROS performed and is unremarkable other than noted in HPI  Past Medical History  Diagnosis Date  . 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negative cardiac cath  . COPD (chronic obstructive pulmonary disease)   . Hypertension   . Peripheral vascular disease   . Nicotine addiction   . Asthma    Past Surgical History  Procedure Laterality Date  . Endovascular stent insertion Bilateral     Legs  . Cardiac surgery    . Hemorrhoid surgery     Social History:  reports that she has been smoking Cigarettes.  She has been smoking about  0.50 packs per day. She has never used smokeless tobacco. She reports that she drinks alcohol. She reports that she does not use illicit drugs.  Allergies  Allergen Reactions  . Aspirin Nausea And Vomiting and Other (See Comments)    Pt states aspirin makes her cramp and have to use coated kind.    Family History  Problem Relation Age of Onset  . Hypertension Mother   . Hypertension Father     Prior to Admission medications   Medication Sig Start Date End Date Taking? Authorizing Provider  aspirin EC 325 MG tablet Take 325 mg by mouth daily.   Yes Historical Provider, MD  atorvastatin (LIPITOR) 80 MG tablet Take 80 mg by mouth every morning. 11/15/14  Yes Historical Provider, MD  ferrous sulfate 325 (65 FE) MG tablet Take 325 mg by mouth daily.   Yes Historical Provider, MD  losartan (COZAAR) 50 MG tablet Take 50 mg by mouth daily. 12/09/14  Yes Historical Provider, MD  metoprolol (LOPRESSOR) 50 MG tablet Take 100 mg by mouth daily. 01/19/15  Yes Historical Provider, MD   Physical Exam: Filed Vitals:   01/23/15 0538 01/23/15 0631 01/23/15 1056 01/23/15 1326  BP: 112/60  106/53 94/55  Pulse:    90  Temp:  98.7 F (37.1 C)  99.2 F (37.3 C)  TempSrc:  Oral  Oral  Resp:    20  Height:      Weight:  SpO2: 93%   97%    Wt Readings from Last 3 Encounters:  01/22/15 57.267 kg (126 lb 4 oz)    General:  Appears calm and comfortable Eyes: PERRL, normal lids, irises & conjunctiva ENT: grossly normal hearing, lips & tongue Neck: no LAD, masses or thyromegaly Cardiovascular: RRR, no m/r/g. No LE edema. Respiratory: CTA bilaterally, no w/r/r. Normal respiratory effort. Abdomen: soft, nontender Skin: no rash or induration seen on limited exam Musculoskeletal: grossly normal tone BUE/BLE Psychiatric: grossly normal mood and affect Neurologic: grossly non-focal.          Labs on Admission:  Basic Metabolic Panel:  Recent Labs Lab 01/22/15 0604 01/23/15 0424  NA 134* 134*   K 3.7 3.9  CL 100* 101  CO2 23 24  GLUCOSE 106* 100*  BUN 15 16  CREATININE 1.05* 1.11*  CALCIUM 9.2 8.4*   Liver Function Tests: No results for input(s): AST, ALT, ALKPHOS, BILITOT, PROT, ALBUMIN in the last 168 hours. No results for input(s): LIPASE, AMYLASE in the last 168 hours. No results for input(s): AMMONIA in the last 168 hours. CBC:  Recent Labs Lab 01/22/15 0604 01/23/15 0424  WBC 12.2* 12.0*  HGB 11.3* 10.3*  HCT 34.0* 31.7*  MCV 94.5 94.7  PLT 272 228   Cardiac Enzymes:  Recent Labs Lab 01/22/15 0604  TROPONINI <0.03    BNP (last 3 results) No results for input(s): BNP in the last 8760 hours.  ProBNP (last 3 results) No results for input(s): PROBNP in the last 8760 hours.  CBG: No results for input(s): GLUCAP in the last 168 hours.  Radiological Exams on Admission: Dg Chest 2 View  01/22/2015   CLINICAL DATA:  Pleurisy. Right-sided pain, back pain for 1 week. History of COPD.  EXAM: CHEST  2 VIEW  COMPARISON:  10/15/2013  FINDINGS: Within the posterior right upper lobe is a a 4.7 cm rounded masslike opacity, with surrounding ill-defined density. Additional ill-defined density in the right middle lobe. Underlying emphysema again seen. Probable atelectasis or scarring at the left costophrenic angle. The heart size and mediastinal contours are normal, mild atherosclerosis of the thoracic aorta. No pleural effusion. No pneumothorax. There is degenerative change in the spine.  IMPRESSION: Rounded 4.7 since masslike opacity in the right upper lobe, with surrounding ill-defined density. This may reflect pneumonia versus neoplasm. There is additional opacity in the right middle lobe concerning for pneumonia.  Chest CT correlation versus radiographic follow-up recommended. While traditional follow-up for pneumonia would be in 3-4 weeks after course of antibiotics, given the masslike appearance, shorter interval follow-up is recommended in 1-2 weeks.   Electronically  Signed   By: Jeb Levering M.D.   On: 01/22/2015 06:42   Ct Chest Wo Contrast  01/22/2015   CLINICAL DATA:  Five day history of right-sided chest pain, worsening over the past 2 days. Right upper lobe lung mass on earlier chest x-ray.  EXAM: CT CHEST WITHOUT CONTRAST  TECHNIQUE: Multidetector CT imaging of the chest was performed following the standard protocol without IV contrast.  COMPARISON:  CT chest 10/14/2012. Two-view chest x-ray earlier same date.  FINDINGS: Lack of intravenous contrast makes the examination less sensitive than it would be otherwise. Severe emphysematous changes throughout both lungs with dense masslike opacity in the right upper lobe. Gas bubbles within the opacity. Extensive bullous changes are present in the right upper lobe in the area of the opacity. Patchy airspace opacities are present more inferiorly in the right lower lobe. Minimal  patchy airspace opacities anteriorly in the right middle lobe. Masslike pleural-based opacity in the inferior left lower lobe measuring approximately 1.8 x 2.1 x 2.0 cm.  Multiple bilateral lung nodules, many of which are new since the prior CT. The largest nodule is in the inferior left upper lobe and measures approximately 5 mm (series 3, image 30).  No associated pleural effusions.  No pleural masses.  Normal heart size with extensive 3 vessel coronary atherosclerosis. Severe atherosclerosis involving the thoracic and upper abdominal aorta and their visualized branches without evidence of aneurysm. No visible mediastinal, hilar or axillary lymphadenopathy. Visualized thyroid gland unremarkable.  Visualized upper abdomen unremarkable. 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  IMPRESSION: 1. 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 2. Approximate 2 cm mass in the inferior left lower lobe. 3.  Numerous bilateral pulmonary nodules, the largest approximating 5 mm in the left lower lobe. 4. Severe COPD/emphysema. 5. No visible mediastinal, hilar or axillary lymphadenopathy, allowing for the unenhanced technique.   Electronically Signed   By: Evangeline Dakin M.D.   On: 01/22/2015 08:29   Ct Chest W Contrast  01/22/2015   CLINICAL DATA:  Right upper lobe lung mass with productive cough and chest pain.  EXAM: CT CHEST, ABDOMEN, AND PELVIS WITH CONTRAST  TECHNIQUE: Multidetector CT imaging of the chest, abdomen and pelvis was performed following the standard protocol during bolus administration of intravenous contrast.  CONTRAST:  157mL OMNIPAQUE IOHEXOL 300 MG/ML  SOLN  COMPARISON:  Unenhanced CT of the chest earlier today at 737 hours and prior CT on 10/14/2012.  FINDINGS: CT CHEST FINDINGS  Geographic area of peripheral consolidation in the lateral and posterior right upper lobe spans over a region measuring roughly 3.7 x 7.3 x 4.4 cm. Areas of gas formation are consistent with necrosis and there is a small posterior component of focal fluid attenuation measuring roughly 1.5 cm in diameter. Based on the contrast-enhanced CT, this may represent a necrotic pneumonia and is not necessarily representative of malignancy. The small area of liquefaction may represent early focal abscess formation. Additional ground-glass and alveolar opacity in the anterior right upper lobe and right middle lobe likely represent acute infection and extension of infiltrate. Clinical and imaging follow-up is recommended. If the process does not resolve, or worsens on antibiotic therapy, Pulmonary Medicine evaluation is recommended.  The area of mass-like opacity measured in the anterior aspect of the lateral left lower lobe is again visualized. This is also somewhat geographic in appearance and may not represent a focal mass. Focal atelectasis and scarring is possible. Malignancy is not completely excluded. A 6-7 mm anterior right  upper lobe nodule is stable since 2014. A 5 mm anterior left upper lobe nodule is stable since 2014. A 5 mm inferior lingular nodule is stable since 2014. 4 mm subpleural nodule in the right middle lobe is stable since 2014. There are 2 tiny subpleural nodules in the posterior right lower lobe. One of these located more medially was not as apparent on the 2014 study. Tiny lateral subpleural nodules in the right lower lobe appears stable since the prior study in 2014.  No airway obstruction. Mildly prominent right hilar lymph node measures approximately 11 mm in short axis. There are a few tiny additional mediastinal nodes with no enlarged lymph nodes seen. No pleural or pericardial fluid is seen. The heart size is normal. Mild spondylosis present  of the thoracic spine.  CT ABDOMEN AND PELVIS FINDINGS  Solid organs show no evidence of metastatic disease. The liver, pancreas, spleen, adrenal glands and kidneys are normal. There is a small hiatal hernia. The gallbladder has been removed. No enlarged lymph nodes are seen.  Bowel is unremarkable and shows no evidence of obstruction or lesion. No free fluid, free air or abscess is identified. Small right inguinal hernia contains fat. The bladder is unremarkable. The uterus has been removed. The abdominal aorta and iliac arteries show atherosclerotic calcifications without aneurysmal disease or significant obstructive disease identified. No bony lesions are identified.  IMPRESSION: 1. The geographic area of peripheral consolidation in the right upper lobe has an appearance most likely consistent with necrotic pneumonia with additional small early abscess formation measuring 1.5 cm within this region. Although malignancy cannot be excluded, necrotic pneumonia is favored. Additional alveolar and ground-glass infiltrate extends into the anterior right upper lobe and right middle lobe. If the process does not resolve or worsens, pulmonary Medicine evaluation is recommended.  Associated 11 mm right hilar lymph node may be reactive or metastatic. 2. The peripheral lateral left lower lobe opacity may represent focal atelectasis and scarring. Malignancy cannot be excluded, however. Once the pneumonia of the right upper lobe has been adequately treated, if there is persistent opacity in the left lower lobe, PET scan evaluation may be helpful. 3. All of the subcentimeter small nodules identified bilaterally are largely peripheral/subpleural and are stable since the CT in 2014. One of the right lower lobe nodules may be new/more prominent but only measures a few mm in diameter. All of these tiny nodules are most likely postinflammatory and not representative of metastatic nodules. 4. No evidence of pathology in the abdomen or pelvis. No acute findings or evidence of metastatic disease. Incidental small hiatal hernia and small right inguinal hernia containing fat.   Electronically Signed   By: Aletta Edouard M.D.   On: 01/22/2015 13:45   Ct Abdomen Pelvis W Contrast  01/22/2015   CLINICAL DATA:  Right upper lobe lung mass with productive cough and chest pain.  EXAM: CT CHEST, ABDOMEN, AND PELVIS WITH CONTRAST  TECHNIQUE: Multidetector CT imaging of the chest, abdomen and pelvis was performed following the standard protocol during bolus administration of intravenous contrast.  CONTRAST:  133mL OMNIPAQUE IOHEXOL 300 MG/ML  SOLN  COMPARISON:  Unenhanced CT of the chest earlier today at 737 hours and prior CT on 10/14/2012.  FINDINGS: CT CHEST FINDINGS  Geographic area of peripheral consolidation in the lateral and posterior right upper lobe spans over a region measuring roughly 3.7 x 7.3 x 4.4 cm. Areas of gas formation are consistent with necrosis and there is a small posterior component of focal fluid attenuation measuring roughly 1.5 cm in diameter. Based on the contrast-enhanced CT, this may represent a necrotic pneumonia and is not necessarily representative of malignancy. The small area  of liquefaction may represent early focal abscess formation. Additional ground-glass and alveolar opacity in the anterior right upper lobe and right middle lobe likely represent acute infection and extension of infiltrate. Clinical and imaging follow-up is recommended. If the process does not resolve, or worsens on antibiotic therapy, Pulmonary Medicine evaluation is recommended.  The area of mass-like opacity measured in the anterior aspect of the lateral left lower lobe is again visualized. This is also somewhat geographic in appearance and may not represent a focal mass. Focal atelectasis and scarring is possible. Malignancy is not completely excluded. A 6-7 mm  anterior right upper lobe nodule is stable since 2014. A 5 mm anterior left upper lobe nodule is stable since 2014. A 5 mm inferior lingular nodule is stable since 2014. 4 mm subpleural nodule in the right middle lobe is stable since 2014. There are 2 tiny subpleural nodules in the posterior right lower lobe. One of these located more medially was not as apparent on the 2014 study. Tiny lateral subpleural nodules in the right lower lobe appears stable since the prior study in 2014.  No airway obstruction. Mildly prominent right hilar lymph node measures approximately 11 mm in short axis. There are a few tiny additional mediastinal nodes with no enlarged lymph nodes seen. No pleural or pericardial fluid is seen. The heart size is normal. Mild spondylosis present of the thoracic spine.  CT ABDOMEN AND PELVIS FINDINGS  Solid organs show no evidence of metastatic disease. The liver, pancreas, spleen, adrenal glands and kidneys are normal. There is a small hiatal hernia. The gallbladder has been removed. No enlarged lymph nodes are seen.  Bowel is unremarkable and shows no evidence of obstruction or lesion. No free fluid, free air or abscess is identified. Small right inguinal hernia contains fat. The bladder is unremarkable. The uterus has been removed. The  abdominal aorta and iliac arteries show atherosclerotic calcifications without aneurysmal disease or significant obstructive disease identified. No bony lesions are identified.  IMPRESSION: 1. The geographic area of peripheral consolidation in the right upper lobe has an appearance most likely consistent with necrotic pneumonia with additional small early abscess formation measuring 1.5 cm within this region. Although malignancy cannot be excluded, necrotic pneumonia is favored. Additional alveolar and ground-glass infiltrate extends into the anterior right upper lobe and right middle lobe. If the process does not resolve or worsens, pulmonary Medicine evaluation is recommended. Associated 11 mm right hilar lymph node may be reactive or metastatic. 2. The peripheral lateral left lower lobe opacity may represent focal atelectasis and scarring. Malignancy cannot be excluded, however. Once the pneumonia of the right upper lobe has been adequately treated, if there is persistent opacity in the left lower lobe, PET scan evaluation may be helpful. 3. All of the subcentimeter small nodules identified bilaterally are largely peripheral/subpleural and are stable since the CT in 2014. One of the right lower lobe nodules may be new/more prominent but only measures a few mm in diameter. All of these tiny nodules are most likely postinflammatory and not representative of metastatic nodules. 4. No evidence of pathology in the abdomen or pelvis. No acute findings or evidence of metastatic disease. Incidental small hiatal hernia and small right inguinal hernia containing fat.   Electronically Signed   By: Aletta Edouard M.D.   On: 01/22/2015 13:45    EKG: Independently reviewed.  Assessment/Plan Active Problems:   Necrotizing pneumonia   1. Cavitary Pneumonia -I have reviewed the CT findings -agree with sputum for AFB collection -will continue with zosyn for now when ready for discharge can switch to  clindamycin -hold off on bronc now until we repeat a scan in a few weeks to look for improvement or no improvement -if there is no improvement then she will need a bronch  2. Tobacco Use Ongoing -smoking cessation counseling provided  Code Status: Full Code Family Communication: Boyfriend (indicate person spoken with, if applicable, with phone number if by telephone) Disposition Plan: Home (indicate anticipated LOS)  Time spent: 4min Consult    I have personally obtained a history, examined the patient, evaluated  laboratory and imaging results, formulated the assessment and plan and placed orders.  The Patient requires high complexity decision making for assessment and support.    Allyne Gee, MD [facility] Pulmonary Critical Care Medicine Sleep Medicine

## 2015-01-23 NOTE — Progress Notes (Signed)
[place] [facility]                                                                                                                                                                                            Patient Demographics   [name], is a 59 y.o. female, DOB - [birth year], CHE:[id number]  Admit date - 01/22/2015   Admitting Physician Dustin Flock, MD  Outpatient Primary MD for the patient is No primary care provider on file.  LOS - 1  Chief Complaint  Patient presents with  . Pleurisy     patient reports that the chest pain is little better but still coughing. Unable to produce much sputum   Review of Systems:   CONSTITUTIONAL: No documented fever. No fatigue, weakness. No weight gain, no weight loss.  EYES: No blurry or double vision.  ENT: No tinnitus. No postnasal drip. No redness of the oropharynx.  RESPIRATORY: Dry cough, no wheeze, no hemoptysis. Mild dyspnea.  CARDIOVASCULAR: Right-sided chest pain. No orthopnea. No palpitations. No syncope.  GASTROINTESTINAL: No nausea, no vomiting or diarrhea. No abdominal pain. No melena or hematochezia.  GENITOURINARY: No dysuria or hematuria.  ENDOCRINE: No polyuria or nocturia. No heat or cold intolerance.  HEMATOLOGY: No anemia. No bruising. No bleeding.  INTEGUMENTARY: No rashes. No lesions.  MUSCULOSKELETAL: No arthritis. No swelling. No gout.  NEUROLOGIC: No numbness, tingling, or ataxia. No seizure-type activity.  PSYCHIATRIC: No anxiety. No insomnia. No ADD.    Vitals:   Filed Vitals:   01/23/15 0532 01/23/15 0538 01/23/15 0631 01/23/15 1056  BP: 105/58 112/60  106/53  Pulse:      Temp: 100 F (37.8 C)  98.7 F (37.1 C)   TempSrc: Oral  Oral   Resp:      Height:      Weight:      SpO2:  93%      Wt Readings from Last 3 Encounters:  01/22/15 57.267 kg (126 lb 4 oz)     Intake/Output Summary (Last 24 hours) at 01/23/15 1233 Last data filed at 01/23/15 0700   Gross per 24 hour  Intake 1808.25 ml  Output    200 ml  Net 1608.25 ml    Physical Exam:   GENERAL: Pleasant-appearing in no apparent distress.  HEAD, EYES, EARS, NOSE AND THROAT: Atraumatic, normocephalic. Extraocular muscles are intact. Pupils equal and reactive to light. Sclerae anicteric. No conjunctival injection. No oro-pharyngeal erythema.  NECK: Supple. There is no jugular venous distention. No bruits, no lymphadenopathy, no thyromegaly.  HEART: Regular rate and rhythm, tachycardic. No murmurs, no rubs, no clicks.  LUNGS: Occasional rhonchi. No rales or  No wheezes.  ABDOMEN: Soft, flat, nontender, nondistended. Has good bowel sounds. No hepatosplenomegaly appreciated.  EXTREMITIES: No evidence of any cyanosis, clubbing, or peripheral edema.  +2 pedal and radial pulses bilaterally.  NEUROLOGIC: The patient is alert, awake, and oriented x3 with no focal motor or sensory deficits appreciated bilaterally.  SKIN: Moist and warm with no rashes appreciated.  Psych: Not anxious, depressed LN: No inguinal LN enlargement    Antibiotics   Anti-infectives    Start     Dose/Rate Route Frequency Ordered Stop   01/22/15 1600  piperacillin-tazobactam (ZOSYN) IVPB 3.375 g     3.375 g 12.5 mL/hr over 240 Minutes Intravenous 3 times per day 01/22/15 1352     01/22/15 1430  azithromycin (ZITHROMAX) 500 mg in dextrose 5 % 250 mL IVPB     500 mg 250 mL/hr over 60 Minutes Intravenous  Once 01/22/15 1351 01/22/15 1606   01/22/15 1400  piperacillin-tazobactam (ZOSYN) IVPB 3.375 g  Status:  Discontinued     3.375 g 12.5 mL/hr over 240 Minutes Intravenous 3 times per day 01/22/15 0951 01/22/15 1352   01/22/15 1200  vancomycin (VANCOCIN) IVPB 1000 mg/200 mL premix     1,000 mg 200 mL/hr over 60 Minutes Intravenous Every 18 hours 01/22/15 1124     01/22/15 1000  vancomycin (VANCOCIN) IVPB 1000 mg/200 mL premix  Status:  Discontinued     1,000 mg 200 mL/hr over 60 Minutes Intravenous Every 24 hours  01/22/15 0951 01/22/15 1124   01/22/15 0845  cefTRIAXone (ROCEPHIN) 1 g in dextrose 5 % 50 mL IVPB     1 g 100 mL/hr over 30 Minutes Intravenous  Once 01/22/15 0843 01/22/15 0931   01/22/15 0845  azithromycin (ZITHROMAX) 500 mg in dextrose 5 % 250 mL IVPB  Status:  Discontinued     500 mg 250 mL/hr over 60 Minutes Intravenous  Once 01/22/15 0843 01/22/15 1351      Medications   Scheduled Meds: . atorvastatin  80 mg Oral BH-[id number]  . ferrous sulfate  325 mg Oral Daily  . losartan  50 mg Oral Daily  . metoprolol  100 mg Oral Daily  . piperacillin-tazobactam (ZOSYN)  IV  3.375 g Intravenous 3 times per day  . tuberculin  5 Units Intradermal Once  . vancomycin  1,000 mg Intravenous Q18H   Continuous Infusions: . sodium chloride 75 mL/hr at 01/23/15 0056   PRN Meds:.acetaminophen **OR** acetaminophen, HYDROcodone-acetaminophen, morphine injection, nicotine polacrilex, ondansetron **OR** ondansetron (ZOFRAN) IV, senna-docusate   Data Review:   Micro Results Recent Results (from the past 240 hour(s))  Culture, blood (routine x 2)     Status: None (Preliminary result)   Collection Time: 01/22/15  9:02 AM  Result Value Ref Range Status   Specimen Description BLOOD  Final   Special Requests NONE  Final   Culture NO GROWTH < 24 HOURS  Final   Report Status PENDING  Incomplete  Culture, blood (routine x 2)     Status: None (Preliminary result)   Collection Time: 01/22/15  9:02 AM  Result Value Ref Range Status   Specimen Description BLOOD  Final   Special Requests NONE  Final   Culture NO GROWTH < 24 HOURS  Final   Report Status PENDING  Incomplete    Radiology Reports Dg Chest 2 View  01/22/2015   CLINICAL DATA:  Pleurisy. Right-sided pain, back pain for 1 week. History of COPD.  EXAM: CHEST  2 VIEW  COMPARISON:  10/15/2013  FINDINGS: Within the posterior right upper lobe is a a 4.7 cm rounded masslike opacity, with surrounding ill-defined density. Additional ill-defined density in  the right middle lobe. Underlying emphysema again seen. Probable atelectasis or scarring at the left costophrenic angle. The heart size and mediastinal contours are normal, mild atherosclerosis of the thoracic aorta. No pleural effusion. No pneumothorax. There is degenerative change in the spine.  IMPRESSION: Rounded 4.7 since masslike opacity in the right upper lobe, with surrounding ill-defined density. This may reflect pneumonia versus neoplasm. There is additional opacity in the right middle lobe concerning for pneumonia.  Chest CT correlation versus radiographic follow-up recommended. While traditional follow-up for pneumonia would be in 3-4 weeks after course of antibiotics, given the masslike appearance, shorter interval follow-up is recommended in 1-2 weeks.   Electronically Signed   By: Jeb Levering M.D.   On: 01/22/2015 06:42   Ct Chest Wo Contrast  01/22/2015   CLINICAL DATA:  Five day history of right-sided chest pain, worsening over the past 2 days. Right upper lobe lung mass on earlier chest x-ray.  EXAM: CT CHEST WITHOUT CONTRAST  TECHNIQUE: Multidetector CT imaging of the chest was performed following the standard protocol without IV contrast.  COMPARISON:  CT chest 10/14/2012. Two-view chest x-ray earlier same date.  FINDINGS: Lack of intravenous contrast makes the examination less sensitive than it would be otherwise. Severe emphysematous changes throughout both lungs with dense masslike opacity in the right upper lobe. Gas bubbles within the opacity. Extensive bullous changes are present in the right upper lobe in the area of the opacity. Patchy airspace opacities are present more inferiorly in the right lower lobe. Minimal patchy airspace opacities anteriorly in the right middle lobe. Masslike pleural-based opacity in the inferior left lower lobe measuring approximately 1.8 x 2.1 x 2.0 cm.  Multiple bilateral lung nodules, many of which are new since the prior CT. The largest nodule is in  the inferior left upper lobe and measures approximately 5 mm (series 3, image 30).  No associated pleural effusions.  No pleural masses.  Normal heart size with extensive 3 vessel coronary atherosclerosis. Severe atherosclerosis involving the thoracic and upper abdominal aorta and their visualized branches without evidence of aneurysm. No visible mediastinal, hilar or axillary lymphadenopathy. Visualized thyroid gland unremarkable.  Visualized upper abdomen unremarkable. 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  IMPRESSION: 1. 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 2. Approximate 2 cm mass in the inferior left lower lobe. 3. Numerous bilateral pulmonary nodules, the largest approximating 5 mm in the left lower lobe. 4. Severe COPD/emphysema. 5. No visible mediastinal, hilar or axillary lymphadenopathy, allowing for the unenhanced technique.   Electronically Signed   By: Evangeline Dakin M.D.   On: 01/22/2015 08:29   Ct Chest W Contrast  01/22/2015   CLINICAL DATA:  Right upper lobe lung mass with productive cough and chest pain.  EXAM: CT CHEST, ABDOMEN, AND PELVIS WITH CONTRAST  TECHNIQUE: Multidetector CT imaging of the chest, abdomen and pelvis was performed following the standard protocol during bolus administration of intravenous contrast.  CONTRAST:  159mL OMNIPAQUE IOHEXOL 300 MG/ML  SOLN  COMPARISON:  Unenhanced CT of the chest earlier today at 737 hours and prior CT on 10/14/2012.  FINDINGS: CT CHEST FINDINGS  Geographic area of peripheral consolidation in the lateral and posterior right upper lobe  spans over a region measuring roughly 3.7 x 7.3 x 4.4 cm. Areas of gas formation are consistent with necrosis and there is a small posterior component of focal fluid attenuation measuring roughly 1.5 cm in diameter. Based on the contrast-enhanced CT, this  may represent a necrotic pneumonia and is not necessarily representative of malignancy. The small area of liquefaction may represent early focal abscess formation. Additional ground-glass and alveolar opacity in the anterior right upper lobe and right middle lobe likely represent acute infection and extension of infiltrate. Clinical and imaging follow-up is recommended. If the process does not resolve, or worsens on antibiotic therapy, Pulmonary Medicine evaluation is recommended.  The area of mass-like opacity measured in the anterior aspect of the lateral left lower lobe is again visualized. This is also somewhat geographic in appearance and may not represent a focal mass. Focal atelectasis and scarring is possible. Malignancy is not completely excluded. A 6-7 mm anterior right upper lobe nodule is stable since 2014. A 5 mm anterior left upper lobe nodule is stable since 2014. A 5 mm inferior lingular nodule is stable since 2014. 4 mm subpleural nodule in the right middle lobe is stable since 2014. There are 2 tiny subpleural nodules in the posterior right lower lobe. One of these located more medially was not as apparent on the 2014 study. Tiny lateral subpleural nodules in the right lower lobe appears stable since the prior study in 2014.  No airway obstruction. Mildly prominent right hilar lymph node measures approximately 11 mm in short axis. There are a few tiny additional mediastinal nodes with no enlarged lymph nodes seen. No pleural or pericardial fluid is seen. The heart size is normal. Mild spondylosis present of the thoracic spine.  CT ABDOMEN AND PELVIS FINDINGS  Solid organs show no evidence of metastatic disease. The liver, pancreas, spleen, adrenal glands and kidneys are normal. There is a small hiatal hernia. The gallbladder has been removed. No enlarged lymph nodes are seen.  Bowel is unremarkable and shows no evidence of obstruction or lesion. No free fluid, free air or abscess is identified.  Small right inguinal hernia contains fat. The bladder is unremarkable. The uterus has been removed. The abdominal aorta and iliac arteries show atherosclerotic calcifications without aneurysmal disease or significant obstructive disease identified. No bony lesions are identified.  IMPRESSION: 1. The geographic area of peripheral consolidation in the right upper lobe has an appearance most likely consistent with necrotic pneumonia with additional small early abscess formation measuring 1.5 cm within this region. Although malignancy cannot be excluded, necrotic pneumonia is favored. Additional alveolar and ground-glass infiltrate extends into the anterior right upper lobe and right middle lobe. If the process does not resolve or worsens, pulmonary Medicine evaluation is recommended. Associated 11 mm right hilar lymph node may be reactive or metastatic. 2. The peripheral lateral left lower lobe opacity may represent focal atelectasis and scarring. Malignancy cannot be excluded, however. Once the pneumonia of the right upper lobe has been adequately treated, if there is persistent opacity in the left lower lobe, PET scan evaluation may be helpful. 3. All of the subcentimeter small nodules identified bilaterally are largely peripheral/subpleural and are stable since the CT in 2014. One of the right lower lobe nodules may be new/more prominent but only measures a few mm in diameter. All of these tiny nodules are most likely postinflammatory and not representative of metastatic nodules. 4. No evidence of pathology in the abdomen or pelvis. No acute findings or evidence of  metastatic disease. Incidental small hiatal hernia and small right inguinal hernia containing fat.   Electronically Signed   By: Aletta Edouard M.D.   On: 01/22/2015 13:45   Ct Abdomen Pelvis W Contrast  01/22/2015   CLINICAL DATA:  Right upper lobe lung mass with productive cough and chest pain.  EXAM: CT CHEST, ABDOMEN, AND PELVIS WITH CONTRAST   TECHNIQUE: Multidetector CT imaging of the chest, abdomen and pelvis was performed following the standard protocol during bolus administration of intravenous contrast.  CONTRAST:  118mL OMNIPAQUE IOHEXOL 300 MG/ML  SOLN  COMPARISON:  Unenhanced CT of the chest earlier today at 737 hours and prior CT on 10/14/2012.  FINDINGS: CT CHEST FINDINGS  Geographic area of peripheral consolidation in the lateral and posterior right upper lobe spans over a region measuring roughly 3.7 x 7.3 x 4.4 cm. Areas of gas formation are consistent with necrosis and there is a small posterior component of focal fluid attenuation measuring roughly 1.5 cm in diameter. Based on the contrast-enhanced CT, this may represent a necrotic pneumonia and is not necessarily representative of malignancy. The small area of liquefaction may represent early focal abscess formation. Additional ground-glass and alveolar opacity in the anterior right upper lobe and right middle lobe likely represent acute infection and extension of infiltrate. Clinical and imaging follow-up is recommended. If the process does not resolve, or worsens on antibiotic therapy, Pulmonary Medicine evaluation is recommended.  The area of mass-like opacity measured in the anterior aspect of the lateral left lower lobe is again visualized. This is also somewhat geographic in appearance and may not represent a focal mass. Focal atelectasis and scarring is possible. Malignancy is not completely excluded. A 6-7 mm anterior right upper lobe nodule is stable since 2014. A 5 mm anterior left upper lobe nodule is stable since 2014. A 5 mm inferior lingular nodule is stable since 2014. 4 mm subpleural nodule in the right middle lobe is stable since 2014. There are 2 tiny subpleural nodules in the posterior right lower lobe. One of these located more medially was not as apparent on the 2014 study. Tiny lateral subpleural nodules in the right lower lobe appears stable since the prior study in  2014.  No airway obstruction. Mildly prominent right hilar lymph node measures approximately 11 mm in short axis. There are a few tiny additional mediastinal nodes with no enlarged lymph nodes seen. No pleural or pericardial fluid is seen. The heart size is normal. Mild spondylosis present of the thoracic spine.  CT ABDOMEN AND PELVIS FINDINGS  Solid organs show no evidence of metastatic disease. The liver, pancreas, spleen, adrenal glands and kidneys are normal. There is a small hiatal hernia. The gallbladder has been removed. No enlarged lymph nodes are seen.  Bowel is unremarkable and shows no evidence of obstruction or lesion. No free fluid, free air or abscess is identified. Small right inguinal hernia contains fat. The bladder is unremarkable. The uterus has been removed. The abdominal aorta and iliac arteries show atherosclerotic calcifications without aneurysmal disease or significant obstructive disease identified. No bony lesions are identified.  IMPRESSION: 1. The geographic area of peripheral consolidation in the right upper lobe has an appearance most likely consistent with necrotic pneumonia with additional small early abscess formation measuring 1.5 cm within this region. Although malignancy cannot be excluded, necrotic pneumonia is favored. Additional alveolar and ground-glass infiltrate extends into the anterior right upper lobe and right middle lobe. If the process does not resolve or worsens, pulmonary  Medicine evaluation is recommended. Associated 11 mm right hilar lymph node may be reactive or metastatic. 2. The peripheral lateral left lower lobe opacity may represent focal atelectasis and scarring. Malignancy cannot be excluded, however. Once the pneumonia of the right upper lobe has been adequately treated, if there is persistent opacity in the left lower lobe, PET scan evaluation may be helpful. 3. All of the subcentimeter small nodules identified bilaterally are largely peripheral/subpleural  and are stable since the CT in 2014. One of the right lower lobe nodules may be new/more prominent but only measures a few mm in diameter. All of these tiny nodules are most likely postinflammatory and not representative of metastatic nodules. 4. No evidence of pathology in the abdomen or pelvis. No acute findings or evidence of metastatic disease. Incidental small hiatal hernia and small right inguinal hernia containing fat.   Electronically Signed   By: Aletta Edouard M.D.   On: 01/22/2015 13:45     CBC  Recent Labs Lab 01/22/15 0604 01/23/15 0424  WBC 12.2* 12.0*  HGB 11.3* 10.3*  HCT 34.0* 31.7*  PLT 272 228  MCV 94.5 94.7  MCH 31.3 30.8  MCHC 33.1 32.5  RDW 15.1* 14.8*    Chemistries   Recent Labs Lab 01/22/15 0604 01/23/15 0424  NA 134* 134*  K 3.7 3.9  CL 100* 101  CO2 23 24  GLUCOSE 106* 100*  BUN 15 16  CREATININE 1.05* 1.11*  CALCIUM 9.2 8.4*   ------------------------------------------------------------------------------------------------------------------ estimated creatinine clearance is 47.1 mL/min (by C-G formula based on Cr of 1.11). ------------------------------------------------------------------------------------------------------------------ No results for input(s): HGBA1C in the last 72 hours. ------------------------------------------------------------------------------------------------------------------ No results for input(s): CHOL, HDL, LDLCALC, TRIG, CHOLHDL, LDLDIRECT in the last 72 hours. ------------------------------------------------------------------------------------------------------------------ No results for input(s): TSH, T4TOTAL, T3FREE, THYROIDAB in the last 72 hours.  Invalid input(s): FREET3 ------------------------------------------------------------------------------------------------------------------ No results for input(s): VITAMINB12, FOLATE, FERRITIN, TIBC, IRON, RETICCTPCT in the last 72 hours.  Coagulation profile No  results for input(s): INR, PROTIME in the last 168 hours.  No results for input(s): DDIMER in the last 72 hours.  Cardiac Enzymes  Recent Labs Lab 01/22/15 0604  TROPONINI <0.03   ------------------------------------------------------------------------------------------------------------------ Invalid input(s): POCBNP    Assessment & Plan   Active Problems:   Necrotizing pneumonia IMPRESSION AND PLAN: Patient is a 59 year old African-American female with history of nicotine addiction, COPD presents with right-sided chest pain noted to have a mass as well as possible pneumonia.  #1. Right-sided pleuritic chest pain: This CT of the chest with IV contrast favoring more infectious causes and malignancy. Await pulmonary input I will ask ID to evaluate the patient as well. She is not able to produce much sputum to check for AFB interferon gold currently pending. #2. COPD; there is no evidence of acute exacerbation continue her on Advair and Spiriva. And Combivent inhalers as needed.  #3. Peripheral vascular disease;  aspirin on hold for possible biopsy.  #4. Nicotine addiction smoking cessation done     Code Status Orders        Start     Ordered   01/22/15 0947  Full code   Continuous     01/22/15 0948      Family Communication: Patient   Disposition Plan: Home   Procedures none   Consults pulmonary consult pending   DVT Prophylaxis  SCDs  Lab Results  Component Value Date   PLT 228 01/23/2015     Time Spent in minutes 56 minutes   Dustin Flock M.D on  01/23/2015 at 12:33 PM  Between 7am to 6pm - Pager - [phone]  After 6pm go to [email or web address] - password [email or web address] [facility]   Office  [phone]

## 2015-01-23 NOTE — Plan of Care (Addendum)
Problem: Discharge Progression Outcomes Goal: Other Discharge Outcomes/Goals Outcome: Progressing Patient c/o right side chest/flank pain x2 relieved well prn Norco and Morphine  Patient had a temp of 103.2 that came down to 99.1 with prn tylenol  Patient continues on IV antibiotics  Patient is a standby assist to the bathroom

## 2015-01-24 LAB — BASIC METABOLIC PANEL
ANION GAP: 8 (ref 5–15)
BUN: 9 mg/dL (ref 6–20)
CO2: 24 mmol/L (ref 22–32)
Calcium: 8.3 mg/dL — ABNORMAL LOW (ref 8.9–10.3)
Chloride: 105 mmol/L (ref 101–111)
Creatinine, Ser: 0.98 mg/dL (ref 0.44–1.00)
GFR calc non Af Amer: >60 mL/min (ref >60)
GLUCOSE: 99 mg/dL (ref 65–99)
POTASSIUM: 3.5 mmol/L (ref 3.5–5.1)
SODIUM: 137 mmol/L (ref 135–145)

## 2015-01-24 LAB — CBC
HCT: 27.4 % — ABNORMAL LOW (ref 35.0–47.0)
HEMOGLOBIN: 9 g/dL — AB (ref 12.0–16.0)
MCH: 31.4 pg (ref 26.0–34.0)
MCHC: 32.9 g/dL (ref 32.0–36.0)
MCV: 95.3 fL (ref 80.0–100.0)
PLATELETS: 222 10*3/uL (ref 150–440)
RBC: 2.88 MIL/uL — AB (ref 3.80–5.20)
RDW: 15.2 % — AB (ref 11.5–14.5)
WBC: 8.5 10*3/uL (ref 3.6–11.0)

## 2015-01-24 LAB — RAPID HIV SCREEN (HIV 1/2 AB+AG)
HIV 1/2 Antibodies: NONREACTIVE
HIV-1 P24 ANTIGEN - HIV24: NONREACTIVE

## 2015-01-24 MED ORDER — LACTULOSE 10 GM/15ML PO SOLN
30.0000 g | Freq: Two times a day (BID) | ORAL | Status: DC | PRN
  Filled 2015-01-24: qty 60

## 2015-01-24 MED ORDER — POLYETHYLENE GLYCOL 3350 17 G PO PACK
17.0000 g | PACK | Freq: Every day | ORAL | Status: DC
  Administered 2015-01-24 – 2015-01-29 (×4): 17 g via ORAL
  Filled 2015-01-24 (×12): qty 1

## 2015-01-24 NOTE — Care Management Note (Signed)
Case Management Note  Patient Details  Name: [name] MRN: [id number] Date of Birth: 16-May-1956  Subjective/Objective:                    Action/Plan:   Expected Discharge Date:                  Expected Discharge Plan:     In-House Referral:     Discharge planning Services     Post Acute Care Choice:    Choice offered to:     DME Arranged:    DME Agency:     HH Arranged:    [place] Agency:     Status of Service:     Medicare Important Message Given:   Yes Date Medicare IM Given:   01/24/15 Medicare IM give by:   Hester Mates, RN Date Additional Medicare IM Given:    Additional Medicare Important Message give by:     If discussed at [place] of Stay Meetings, dates discussed:  01/24/15  Additional Comments:  Leveta Wahab A, RN 01/24/2015, 8:51 AM

## 2015-01-24 NOTE — Plan of Care (Signed)
Problem: Discharge Progression Outcomes Goal: Other Discharge Outcomes/Goals Plan of Care Progress to Goal: Pt's PPD was negative.  Must still be on Airborne precautions until 3 sputum specimens are negative.  Still have pleuritic pain - worse When she coughs.  On abx.

## 2015-01-24 NOTE — Plan of Care (Signed)
Problem: Discharge Progression Outcomes Goal: Discharge plan in place and appropriate Individualization  Outcome: Progressing Address patient as [name]. Patient is from home. Patient is a current smoker. Patient has history of COPD, HTN, MI, PVD, controlled with home medications. Moderate Fall Goal: Other Discharge Outcomes/Goals Outcome: Progressing Patient is alert and oriented, c/o pain with coughing. Norco given once with cough syrup with improvement. Independent in room, continue with antibiotics. Encouraged to call for assistance when needed. Awaiting sputum sample.

## 2015-01-24 NOTE — Progress Notes (Signed)
[place] [facility]                                                                                                                                                                                            Patient Demographics   [name], is a 59 y.o. female, DOB - [birth year], KXF:[id number]  Admit date - 01/22/2015   Admitting Physician Dustin Flock, MD  Outpatient Primary MD for the patient is No primary care provider on file.  LOS - 2  Chief Complaint  Patient presents with  . Pleurisy    breathings improved, no significant chest pain Review of Systems:   CONSTITUTIONAL: No documented fever. No fatigue, weakness. No weight gain, no weight loss.  EYES: No blurry or double vision.  ENT: No tinnitus. No postnasal drip. No redness of the oropharynx.  RESPIRATORY: Dry cough, no wheeze, no hemoptysis. Mild dyspnea.  CARDIOVASCULAR: Right-sided chest pain. No orthopnea. No palpitations. No syncope.  GASTROINTESTINAL: No nausea, no vomiting or diarrhea. No abdominal pain. No melena or hematochezia.  GENITOURINARY: No dysuria or hematuria.  ENDOCRINE: No polyuria or nocturia. No heat or cold intolerance.  HEMATOLOGY: No anemia. No bruising. No bleeding.  INTEGUMENTARY: No rashes. No lesions.  MUSCULOSKELETAL: No arthritis. No swelling. No gout.  NEUROLOGIC: No numbness, tingling, or ataxia. No seizure-type activity.  PSYCHIATRIC: No anxiety. No insomnia. No ADD.    Vitals:   Filed Vitals:   01/23/15 1923 01/23/15 2212 01/24/15 0548 01/24/15 0959  BP:  92/56 95/55 103/58  Pulse:  81 75   Temp: 99.7 F (37.6 C) 99.6 F (37.6 C) 98.8 F (37.1 C)   TempSrc: Oral Oral Oral   Resp:  18 18   Height:      Weight:      SpO2:  89% 92%     Wt Readings from Last 3 Encounters:  01/22/15 57.267 kg (126 lb 4 oz)     Intake/Output Summary (Last 24 hours) at 01/24/15 1311 Last data filed at 01/24/15 0357  Gross per 24 hour  Intake       0 ml  Output    900 ml  Net   -900 ml    Physical Exam:   GENERAL: Pleasant-appearing in no apparent distress.  HEAD, EYES, EARS, NOSE AND THROAT: Atraumatic, normocephalic. Extraocular muscles are intact. Pupils equal and reactive to light. Sclerae anicteric. No conjunctival injection. No oro-pharyngeal erythema.  NECK: Supple. There is no jugular venous distention. No bruits, no lymphadenopathy, no thyromegaly.  HEART: Regular rate and rhythm, tachycardic. No murmurs, no rubs, no clicks.  LUNGS: Occasional rhonchi.  No rales or  No wheezes.  ABDOMEN: Soft, flat, nontender, nondistended. Has good bowel sounds. No hepatosplenomegaly appreciated.  EXTREMITIES: No evidence of any cyanosis, clubbing, or peripheral edema.  +2 pedal and radial pulses bilaterally.  NEUROLOGIC: The patient is alert, awake, and oriented x3 with no focal motor or sensory deficits appreciated bilaterally.  SKIN: Moist and warm with no rashes appreciated.  Psych: Not anxious, depressed LN: No inguinal LN enlargement    Antibiotics   Anti-infectives    Start     Dose/Rate Route Frequency Ordered Stop   01/22/15 1600  piperacillin-tazobactam (ZOSYN) IVPB 3.375 g     3.375 g 12.5 mL/hr over 240 Minutes Intravenous 3 times per day 01/22/15 1352     01/22/15 1430  azithromycin (ZITHROMAX) 500 mg in dextrose 5 % 250 mL IVPB     500 mg 250 mL/hr over 60 Minutes Intravenous  Once 01/22/15 1351 01/22/15 1606   01/22/15 1400  piperacillin-tazobactam (ZOSYN) IVPB 3.375 g  Status:  Discontinued     3.375 g 12.5 mL/hr over 240 Minutes Intravenous 3 times per day 01/22/15 0951 01/22/15 1352   01/22/15 1200  vancomycin (VANCOCIN) IVPB 1000 mg/200 mL premix     1,000 mg 200 mL/hr over 60 Minutes Intravenous Every 18 hours 01/22/15 1124     01/22/15 1000  vancomycin (VANCOCIN) IVPB 1000 mg/200 mL premix  Status:  Discontinued     1,000 mg 200 mL/hr over 60 Minutes Intravenous Every 24 hours 01/22/15 0951 01/22/15 1124    01/22/15 0845  cefTRIAXone (ROCEPHIN) 1 g in dextrose 5 % 50 mL IVPB     1 g 100 mL/hr over 30 Minutes Intravenous  Once 01/22/15 0843 01/22/15 0931   01/22/15 0845  azithromycin (ZITHROMAX) 500 mg in dextrose 5 % 250 mL IVPB  Status:  Discontinued     500 mg 250 mL/hr over 60 Minutes Intravenous  Once 01/22/15 0843 01/22/15 1351      Medications   Scheduled Meds: . atorvastatin  80 mg Oral BH-q7a  . ferrous sulfate  325 mg Oral Daily  . guaiFENesin  600 mg Oral BID  . losartan  50 mg Oral Daily  . metoprolol  100 mg Oral Daily  . piperacillin-tazobactam (ZOSYN)  IV  3.375 g Intravenous 3 times per day  . polyethylene glycol  17 g Oral Daily  . tuberculin  5 Units Intradermal Once  . vancomycin  1,000 mg Intravenous Q18H   Continuous Infusions: . sodium chloride 75 mL/hr at 01/24/15 0647   PRN Meds:.acetaminophen **OR** acetaminophen, guaiFENesin-codeine, HYDROcodone-acetaminophen, lactulose, morphine injection, nicotine polacrilex, ondansetron **OR** ondansetron (ZOFRAN) IV, senna-docusate   Data Review:   Micro Results Recent Results (from the past 240 hour(s))  Culture, blood (routine x 2)     Status: None (Preliminary result)   Collection Time: 01/22/15  9:02 AM  Result Value Ref Range Status   Specimen Description BLOOD  Final   Special Requests NONE  Final   Culture NO GROWTH < 24 HOURS  Final   Report Status PENDING  Incomplete  Culture, blood (routine x 2)     Status: None (Preliminary result)   Collection Time: 01/22/15  9:02 AM  Result Value Ref Range Status   Specimen Description BLOOD  Final   Special Requests NONE  Final   Culture NO GROWTH < 24 HOURS  Final   Report Status PENDING  Incomplete    Radiology Reports Dg Chest 2 View  01/22/2015   CLINICAL DATA:  Pleurisy. Right-sided pain, back pain for 1 week. History of COPD.  EXAM: CHEST  2 VIEW  COMPARISON:  10/15/2013  FINDINGS: Within the posterior right upper lobe is a a 4.7 cm rounded masslike  opacity, with surrounding ill-defined density. Additional ill-defined density in the right middle lobe. Underlying emphysema again seen. Probable atelectasis or scarring at the left costophrenic angle. The heart size and mediastinal contours are normal, mild atherosclerosis of the thoracic aorta. No pleural effusion. No pneumothorax. There is degenerative change in the spine.  IMPRESSION: Rounded 4.7 since masslike opacity in the right upper lobe, with surrounding ill-defined density. This may reflect pneumonia versus neoplasm. There is additional opacity in the right middle lobe concerning for pneumonia.  Chest CT correlation versus radiographic follow-up recommended. While traditional follow-up for pneumonia would be in 3-4 weeks after course of antibiotics, given the masslike appearance, shorter interval follow-up is recommended in 1-2 weeks.   Electronically Signed   By: Jeb Levering M.D.   On: 01/22/2015 06:42   Ct Chest Wo Contrast  01/22/2015   CLINICAL DATA:  Five day history of right-sided chest pain, worsening over the past 2 days. Right upper lobe lung mass on earlier chest x-ray.  EXAM: CT CHEST WITHOUT CONTRAST  TECHNIQUE: Multidetector CT imaging of the chest was performed following the standard protocol without IV contrast.  COMPARISON:  CT chest 10/14/2012. Two-view chest x-ray earlier same date.  FINDINGS: Lack of intravenous contrast makes the examination less sensitive than it would be otherwise. Severe emphysematous changes throughout both lungs with dense masslike opacity in the right upper lobe. Gas bubbles within the opacity. Extensive bullous changes are present in the right upper lobe in the area of the opacity. Patchy airspace opacities are present more inferiorly in the right lower lobe. Minimal patchy airspace opacities anteriorly in the right middle lobe. Masslike pleural-based opacity in the inferior left lower lobe measuring approximately 1.8 x 2.1 x 2.0 cm.  Multiple bilateral  lung nodules, many of which are new since the prior CT. The largest nodule is in the inferior left upper lobe and measures approximately 5 mm (series 3, image 30).  No associated pleural effusions.  No pleural masses.  Normal heart size with extensive 3 vessel coronary atherosclerosis. Severe atherosclerosis involving the thoracic and upper abdominal aorta and their visualized branches without evidence of aneurysm. No visible mediastinal, hilar or axillary lymphadenopathy. Visualized thyroid gland unremarkable.  Visualized upper abdomen unremarkable. 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  IMPRESSION: 1. 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 2. Approximate 2 cm mass in the inferior left lower lobe. 3. Numerous bilateral pulmonary nodules, the largest approximating 5 mm in the left lower lobe. 4. Severe COPD/emphysema. 5. No visible mediastinal, hilar or axillary lymphadenopathy, allowing for the unenhanced technique.   Electronically Signed   By: Evangeline Dakin M.D.   On: 01/22/2015 08:29   Ct Chest W Contrast  01/22/2015   CLINICAL DATA:  Right upper lobe lung mass with productive cough and chest pain.  EXAM: CT CHEST, ABDOMEN, AND PELVIS WITH CONTRAST  TECHNIQUE: Multidetector CT imaging of the chest, abdomen and pelvis was performed following the standard protocol during bolus administration of intravenous contrast.  CONTRAST:  115mL OMNIPAQUE IOHEXOL 300 MG/ML  SOLN  COMPARISON:  Unenhanced CT of the chest earlier today at 737 hours and prior CT on 10/14/2012.  FINDINGS: CT CHEST  FINDINGS  Geographic area of peripheral consolidation in the lateral and posterior right upper lobe spans over a region measuring roughly 3.7 x 7.3 x 4.4 cm. Areas of gas formation are consistent with necrosis and there is a small posterior component of focal fluid  attenuation measuring roughly 1.5 cm in diameter. Based on the contrast-enhanced CT, this may represent a necrotic pneumonia and is not necessarily representative of malignancy. The small area of liquefaction may represent early focal abscess formation. Additional ground-glass and alveolar opacity in the anterior right upper lobe and right middle lobe likely represent acute infection and extension of infiltrate. Clinical and imaging follow-up is recommended. If the process does not resolve, or worsens on antibiotic therapy, Pulmonary Medicine evaluation is recommended.  The area of mass-like opacity measured in the anterior aspect of the lateral left lower lobe is again visualized. This is also somewhat geographic in appearance and may not represent a focal mass. Focal atelectasis and scarring is possible. Malignancy is not completely excluded. A 6-7 mm anterior right upper lobe nodule is stable since 2014. A 5 mm anterior left upper lobe nodule is stable since 2014. A 5 mm inferior lingular nodule is stable since 2014. 4 mm subpleural nodule in the right middle lobe is stable since 2014. There are 2 tiny subpleural nodules in the posterior right lower lobe. One of these located more medially was not as apparent on the 2014 study. Tiny lateral subpleural nodules in the right lower lobe appears stable since the prior study in 2014.  No airway obstruction. Mildly prominent right hilar lymph node measures approximately 11 mm in short axis. There are a few tiny additional mediastinal nodes with no enlarged lymph nodes seen. No pleural or pericardial fluid is seen. The heart size is normal. Mild spondylosis present of the thoracic spine.  CT ABDOMEN AND PELVIS FINDINGS  Solid organs show no evidence of metastatic disease. The liver, pancreas, spleen, adrenal glands and kidneys are normal. There is a small hiatal hernia. The gallbladder has been removed. No enlarged lymph nodes are seen.  Bowel is unremarkable and shows  no evidence of obstruction or lesion. No free fluid, free air or abscess is identified. Small right inguinal hernia contains fat. The bladder is unremarkable. The uterus has been removed. The abdominal aorta and iliac arteries show atherosclerotic calcifications without aneurysmal disease or significant obstructive disease identified. No bony lesions are identified.  IMPRESSION: 1. The geographic area of peripheral consolidation in the right upper lobe has an appearance most likely consistent with necrotic pneumonia with additional small early abscess formation measuring 1.5 cm within this region. Although malignancy cannot be excluded, necrotic pneumonia is favored. Additional alveolar and ground-glass infiltrate extends into the anterior right upper lobe and right middle lobe. If the process does not resolve or worsens, pulmonary Medicine evaluation is recommended. Associated 11 mm right hilar lymph node may be reactive or metastatic. 2. The peripheral lateral left lower lobe opacity may represent focal atelectasis and scarring. Malignancy cannot be excluded, however. Once the pneumonia of the right upper lobe has been adequately treated, if there is persistent opacity in the left lower lobe, PET scan evaluation may be helpful. 3. All of the subcentimeter small nodules identified bilaterally are largely peripheral/subpleural and are stable since the CT in 2014. One of the right lower lobe nodules may be new/more prominent but only measures a few mm in diameter. All of these tiny nodules are most likely postinflammatory and not representative of metastatic nodules. 4.  No evidence of pathology in the abdomen or pelvis. No acute findings or evidence of metastatic disease. Incidental small hiatal hernia and small right inguinal hernia containing fat.   Electronically Signed   By: Aletta Edouard M.D.   On: 01/22/2015 13:45   Ct Abdomen Pelvis W Contrast  01/22/2015   CLINICAL DATA:  Right upper lobe lung mass with  productive cough and chest pain.  EXAM: CT CHEST, ABDOMEN, AND PELVIS WITH CONTRAST  TECHNIQUE: Multidetector CT imaging of the chest, abdomen and pelvis was performed following the standard protocol during bolus administration of intravenous contrast.  CONTRAST:  128mL OMNIPAQUE IOHEXOL 300 MG/ML  SOLN  COMPARISON:  Unenhanced CT of the chest earlier today at 737 hours and prior CT on 10/14/2012.  FINDINGS: CT CHEST FINDINGS  Geographic area of peripheral consolidation in the lateral and posterior right upper lobe spans over a region measuring roughly 3.7 x 7.3 x 4.4 cm. Areas of gas formation are consistent with necrosis and there is a small posterior component of focal fluid attenuation measuring roughly 1.5 cm in diameter. Based on the contrast-enhanced CT, this may represent a necrotic pneumonia and is not necessarily representative of malignancy. The small area of liquefaction may represent early focal abscess formation. Additional ground-glass and alveolar opacity in the anterior right upper lobe and right middle lobe likely represent acute infection and extension of infiltrate. Clinical and imaging follow-up is recommended. If the process does not resolve, or worsens on antibiotic therapy, Pulmonary Medicine evaluation is recommended.  The area of mass-like opacity measured in the anterior aspect of the lateral left lower lobe is again visualized. This is also somewhat geographic in appearance and may not represent a focal mass. Focal atelectasis and scarring is possible. Malignancy is not completely excluded. A 6-7 mm anterior right upper lobe nodule is stable since 2014. A 5 mm anterior left upper lobe nodule is stable since 2014. A 5 mm inferior lingular nodule is stable since 2014. 4 mm subpleural nodule in the right middle lobe is stable since 2014. There are 2 tiny subpleural nodules in the posterior right lower lobe. One of these located more medially was not as apparent on the 2014 study. Tiny  lateral subpleural nodules in the right lower lobe appears stable since the prior study in 2014.  No airway obstruction. Mildly prominent right hilar lymph node measures approximately 11 mm in short axis. There are a few tiny additional mediastinal nodes with no enlarged lymph nodes seen. No pleural or pericardial fluid is seen. The heart size is normal. Mild spondylosis present of the thoracic spine.  CT ABDOMEN AND PELVIS FINDINGS  Solid organs show no evidence of metastatic disease. The liver, pancreas, spleen, adrenal glands and kidneys are normal. There is a small hiatal hernia. The gallbladder has been removed. No enlarged lymph nodes are seen.  Bowel is unremarkable and shows no evidence of obstruction or lesion. No free fluid, free air or abscess is identified. Small right inguinal hernia contains fat. The bladder is unremarkable. The uterus has been removed. The abdominal aorta and iliac arteries show atherosclerotic calcifications without aneurysmal disease or significant obstructive disease identified. No bony lesions are identified.  IMPRESSION: 1. The geographic area of peripheral consolidation in the right upper lobe has an appearance most likely consistent with necrotic pneumonia with additional small early abscess formation measuring 1.5 cm within this region. Although malignancy cannot be excluded, necrotic pneumonia is favored. Additional alveolar and ground-glass infiltrate extends into the anterior right  upper lobe and right middle lobe. If the process does not resolve or worsens, pulmonary Medicine evaluation is recommended. Associated 11 mm right hilar lymph node may be reactive or metastatic. 2. The peripheral lateral left lower lobe opacity may represent focal atelectasis and scarring. Malignancy cannot be excluded, however. Once the pneumonia of the right upper lobe has been adequately treated, if there is persistent opacity in the left lower lobe, PET scan evaluation may be helpful. 3. All  of the subcentimeter small nodules identified bilaterally are largely peripheral/subpleural and are stable since the CT in 2014. One of the right lower lobe nodules may be new/more prominent but only measures a few mm in diameter. All of these tiny nodules are most likely postinflammatory and not representative of metastatic nodules. 4. No evidence of pathology in the abdomen or pelvis. No acute findings or evidence of metastatic disease. Incidental small hiatal hernia and small right inguinal hernia containing fat.   Electronically Signed   By: Aletta Edouard M.D.   On: 01/22/2015 13:45     CBC  Recent Labs Lab 01/22/15 0604 01/23/15 0424 01/24/15 0616  WBC 12.2* 12.0* 8.5  HGB 11.3* 10.3* 9.0*  HCT 34.0* 31.7* 27.4*  PLT 272 228 222  MCV 94.5 94.7 95.3  MCH 31.3 30.8 31.4  MCHC 33.1 32.5 32.9  RDW 15.1* 14.8* 15.2*    Chemistries   Recent Labs Lab 01/22/15 0604 01/23/15 0424 01/24/15 0616  NA 134* 134* 137  K 3.7 3.9 3.5  CL 100* 101 105  CO2 23 24 24   GLUCOSE 106* 100* 99  BUN 15 16 9   CREATININE 1.05* 1.11* 0.98  CALCIUM 9.2 8.4* 8.3*   ------------------------------------------------------------------------------------------------------------------ estimated creatinine clearance is 53.4 mL/min (by C-G formula based on Cr of 0.98). ------------------------------------------------------------------------------------------------------------------ No results for input(s): HGBA1C in the last 72 hours. ------------------------------------------------------------------------------------------------------------------ No results for input(s): CHOL, HDL, LDLCALC, TRIG, CHOLHDL, LDLDIRECT in the last 72 hours. ------------------------------------------------------------------------------------------------------------------ No results for input(s): TSH, T4TOTAL, T3FREE, THYROIDAB in the last 72 hours.  Invalid input(s):  FREET3 ------------------------------------------------------------------------------------------------------------------ No results for input(s): VITAMINB12, FOLATE, FERRITIN, TIBC, IRON, RETICCTPCT in the last 72 hours.  Coagulation profile No results for input(s): INR, PROTIME in the last 168 hours.  No results for input(s): DDIMER in the last 72 hours.  Cardiac Enzymes  Recent Labs Lab 01/22/15 0604  TROPONINI <0.03   ------------------------------------------------------------------------------------------------------------------ Invalid input(s): POCBNP    Assessment & Plan   Active Problems:   Necrotizing pneumonia IMPRESSION AND PLAN: Patient is a 59 year old African-American female with history of nicotine addiction, COPD presents with right-sided chest pain noted to have a mass as well as possible pneumonia.  #1. Right-sided pleuritic chest pain: This CT of the chest with IV contrast favoring more infectious causes and malignancy. Seen by pulmonary ,I will ask ID to evaluate the patient as well. PPD negative , continue IV antibiotics  #2. COPD; there is no evidence of acute exacerbation continue her on Advair and Spiriva. And Combivent inhalers as needed.  #3. Peripheral vascular disease;  aspirin on hold for possible biopsy.  #4. Nicotine addiction smoking cessation done     Code Status Orders        Start     Ordered   01/22/15 0947  Full code   Continuous     01/22/15 0948      Family Communication: Patient   Disposition Plan: Home   Procedures none   Consults pulmonary consult pending   DVT Prophylaxis  SCDs  Lab  Results  Component Value Date   PLT 222 01/24/2015     Time Spent in minutes 35 minutes   Dustin Flock M.D on 01/24/2015 at 1:11 PM  Between 7am to 6pm - Pager - [phone]  After 6pm go to [email or web address] - password [email or web address] [facility]   Office  [phone]

## 2015-01-24 NOTE — Consult Note (Signed)
Reason for Consult:Patel, Shreyang    Referring Physician: Cavitary PNA  Active Problems:   Necrotizing pneumonia  HPI: [name] is a 59 y.o. female hx of COPD, HTN PVD ongoing tobacco abuse admitted 5/21 with 2 weeks cough and pleurtiic chest pain.  Found to have R sided infiltrate vs mass.  On further questioning reports 3 months of slowly progressive pleurtic pain.  Didi not seek care for it.  Cough began 2 weeks ago- no hemoptysis.  Did not report fevers but since admit has had temp to 103.  Has recurrent "hot flashes" for years. Denies wt loss. No known Tb contacts. Does continue to smoke. Occas etoh.  Past Medical History  Diagnosis Date  . 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negative cardiac cath  . COPD (chronic obstructive pulmonary disease)   . Hypertension   . Peripheral vascular disease   . Nicotine addiction   . Asthma    Past Surgical History  Procedure Laterality Date  . Endovascular stent insertion Bilateral     Legs  . Cardiac surgery    . Hemorrhoid surgery     Allergies:  Allergies  Allergen Reactions  . Aspirin Nausea And Vomiting and Other (See Comments)    Pt states aspirin makes her cramp and have to use coated kind.    Current antibiotics: Antibiotics Given (last 72 hours)    Date/Time Action Medication Dose Rate   01/22/15 1342 Given   vancomycin (VANCOCIN) IVPB 1000 mg/200 mL premix 1,000 mg 200 mL/hr   01/22/15 1506 Given   azithromycin (ZITHROMAX) 500 mg in dextrose 5 % 250 mL IVPB 500 mg 250 mL/hr   01/22/15 1643 Given   piperacillin-tazobactam (ZOSYN) IVPB 3.375 g 3.375 g 12.5 mL/hr   01/23/15 0056 Given   piperacillin-tazobactam (ZOSYN) IVPB 3.375 g 3.375 g 12.5 mL/hr   01/23/15 0506 Given   vancomycin (VANCOCIN) IVPB 1000 mg/200 mL premix 1,000 mg 200 mL/hr   01/23/15 1057 Given   piperacillin-tazobactam (ZOSYN) IVPB 3.375 g 3.375 g 12.5 mL/hr   01/23/15 1833 Given   piperacillin-tazobactam (ZOSYN) IVPB 3.375 g 3.375 g 12.5 mL/hr   01/23/15  2322 Given   vancomycin (VANCOCIN) IVPB 1000 mg/200 mL premix 1,000 mg 200 mL/hr   01/24/15 4081 Given   piperacillin-tazobactam (ZOSYN) IVPB 3.375 g 3.375 g 12.5 mL/hr   01/24/15 1002 Given   piperacillin-tazobactam (ZOSYN) IVPB 3.375 g 3.375 g 12.5 mL/hr       MEDICATIONS: . atorvastatin  80 mg Oral BH-q7a  . ferrous sulfate  325 mg Oral Daily  . guaiFENesin  600 mg Oral BID  . losartan  50 mg Oral Daily  . metoprolol  100 mg Oral Daily  . piperacillin-tazobactam (ZOSYN)  IV  3.375 g Intravenous 3 times per day  . polyethylene glycol  17 g Oral Daily  . vancomycin  1,000 mg Intravenous Q18H    History  Substance Use Topics  . Smoking status: Current Every Day Smoker -- 0.50 packs/day    Types: Cigarettes  . Smokeless tobacco: Never Used     Comment: pt recommend to stop smoking 4 min spent will start on prn nicotine replacment  . Alcohol Use: 0.0 oz/week    0 Standard drinks or equivalent per week     Comment: weekends    Family History  Problem Relation Age of Onset  . Hypertension Mother   . Hypertension Father     Review of Systems - 11 systems reviewed and negative per HPI  OBJECTIVE: Temp:  [98.8 F (37.1 C)-100.2 F (37.9 C)] 100.2 F (37.9 C) (05/23 1421) Pulse Rate:  [75-107] 107 (05/23 1421) Resp:  [18] 18 (05/23 0548) BP: (92-103)/(55-58) 98/55 mmHg (05/23 1421) SpO2:  [89 %-92 %] 90 % (05/23 1421) GENERAL:  lying in the bed with no acute distress, but diaphoretic EYES: Pupils equal, round, reactive to light and accommodation. No scleral icterus. Extraocular muscles intact.  HEENT: Head atraumatic, normocephalic. Oropharynx and nasopharynx clear.  NECK: Supple, no jugular venous distention. No thyroid enlargement, no tenderness.  LUNGS: Diminished breath sounds bilaterally, no wheezing, rales, positive rhonchi no crepitation. No use of accessory muscles of respiration.  CARDIOVASCULAR: S1, S2 normal. No murmurs, rubs, or gallops.  ABDOMEN:  Soft, nontender, nondistended. Bowel sounds present. No organomegaly or mass.  EXTREMITIES: No pedal edema, cyanosis, or clubbing.  NEUROLOGIC: Cranial nerves II through XII are intact. Muscle strength 5/5 in all extremities. Sensation intact. Gait not checked.  PSYCHIATRIC: The patient is alert and oriented x 3.  SKIN: No obvious rash, lesion, or ulcer  LABS: Results for orders placed or performed during the hospital encounter of 01/22/15 (from the past 48 hour(s))  CBC     Status: Abnormal   Collection Time: 01/23/15  4:24 AM  Result Value Ref Range   WBC 12.0 (H) 3.6 - 11.0 K/uL   RBC 3.35 (L) 3.80 - 5.20 MIL/uL   Hemoglobin 10.3 (L) 12.0 - 16.0 g/dL   HCT 31.7 (L) 35.0 - 47.0 %   MCV 94.7 80.0 - 100.0 fL   MCH 30.8 26.0 - 34.0 pg   MCHC 32.5 32.0 - 36.0 g/dL   RDW 14.8 (H) 11.5 - 14.5 %   Platelets 228 150 - 440 K/uL  Basic metabolic panel     Status: Abnormal   Collection Time: 01/23/15  4:24 AM  Result Value Ref Range   Sodium 134 (L) 135 - 145 mmol/L   Potassium 3.9 3.5 - 5.1 mmol/L   Chloride 101 101 - 111 mmol/L   CO2 24 22 - 32 mmol/L   Glucose, Bld 100 (H) 65 - 99 mg/dL   BUN 16 6 - 20 mg/dL   Creatinine, Ser 1.11 (H) 0.44 - 1.00 mg/dL   Calcium 8.4 (L) 8.9 - 10.3 mg/dL   GFR calc non Af Amer 53 (L) >60 mL/min   GFR calc Af Amer >60 >60 mL/min    Comment: (NOTE) The eGFR has been calculated using the CKD EPI equation. This calculation has not been validated in all clinical situations. eGFR's persistently <60 mL/min signify possible Chronic Kidney Disease.    Anion gap 9 5 - 15  CBC     Status: Abnormal   Collection Time: 01/24/15  6:16 AM  Result Value Ref Range   WBC 8.5 3.6 - 11.0 K/uL   RBC 2.88 (L) 3.80 - 5.20 MIL/uL   Hemoglobin 9.0 (L) 12.0 - 16.0 g/dL   HCT 27.4 (L) 35.0 - 47.0 %   MCV 95.3 80.0 - 100.0 fL   MCH 31.4 26.0 - 34.0 pg   MCHC 32.9 32.0 - 36.0 g/dL   RDW 15.2 (H) 11.5 - 14.5 %   Platelets 222 150 - 440 K/uL  Basic metabolic panel      Status: Abnormal   Collection Time: 01/24/15  6:16 AM  Result Value Ref Range   Sodium 137 135 - 145 mmol/L   Potassium 3.5 3.5 - 5.1 mmol/L   Chloride 105 101 - 111 mmol/L  CO2 24 22 - 32 mmol/L   Glucose, Bld 99 65 - 99 mg/dL   BUN 9 6 - 20 mg/dL   Creatinine, Ser 0.98 0.44 - 1.00 mg/dL   Calcium 8.3 (L) 8.9 - 10.3 mg/dL   GFR calc non Af Amer >60 >60 mL/min   GFR calc Af Amer >60 >60 mL/min    Comment: (NOTE) The eGFR has been calculated using the CKD EPI equation. This calculation has not been validated in all clinical situations. eGFR's persistently <60 mL/min signify possible Chronic Kidney Disease.    Anion gap 8 5 - 15   No components found for: ESR, C REACTIVE PROTEIN MICRO: Results for orders placed or performed during the hospital encounter of 01/22/15  Culture, blood (routine x 2)     Status: None (Preliminary result)   Collection Time: 01/22/15  9:02 AM  Result Value Ref Range Status   Specimen Description BLOOD  Final   Special Requests NONE  Final   Culture NO GROWTH < 24 HOURS  Final   Report Status PENDING  Incomplete  Culture, blood (routine x 2)     Status: None (Preliminary result)   Collection Time: 01/22/15  9:02 AM  Result Value Ref Range Status   Specimen Description BLOOD  Final   Special Requests NONE  Final   Culture NO GROWTH < 24 HOURS  Final   Report Status PENDING  Incomplete    IMAGING: Dg Chest 2 View  01/22/2015   CLINICAL DATA:  Pleurisy. Right-sided pain, back pain for 1 week. History of COPD.  EXAM: CHEST  2 VIEW  COMPARISON:  10/15/2013  FINDINGS: Within the posterior right upper lobe is a a 4.7 cm rounded masslike opacity, with surrounding ill-defined density. Additional ill-defined density in the right middle lobe. Underlying emphysema again seen. Probable atelectasis or scarring at the left costophrenic angle. The heart size and mediastinal contours are normal, mild atherosclerosis of the thoracic aorta. No pleural effusion. No  pneumothorax. There is degenerative change in the spine.  IMPRESSION: Rounded 4.7 since masslike opacity in the right upper lobe, with surrounding ill-defined density. This may reflect pneumonia versus neoplasm. There is additional opacity in the right middle lobe concerning for pneumonia.  Chest CT correlation versus radiographic follow-up recommended. While traditional follow-up for pneumonia would be in 3-4 weeks after course of antibiotics, given the masslike appearance, shorter interval follow-up is recommended in 1-2 weeks.   Electronically Signed   By: Jeb Levering M.D.   On: 01/22/2015 06:42   Ct Chest Wo Contrast  01/22/2015   CLINICAL DATA:  Five day history of right-sided chest pain, worsening over the past 2 days. Right upper lobe lung mass on earlier chest x-ray.  EXAM: CT CHEST WITHOUT CONTRAST  TECHNIQUE: Multidetector CT imaging of the chest was performed following the standard protocol without IV contrast.  COMPARISON:  CT chest 10/14/2012. Two-view chest x-ray earlier same date.  FINDINGS: Lack of intravenous contrast makes the examination less sensitive than it would be otherwise. Severe emphysematous changes throughout both lungs with dense masslike opacity in the right upper lobe. Gas bubbles within the opacity. Extensive bullous changes are present in the right upper lobe in the area of the opacity. Patchy airspace opacities are present more inferiorly in the right lower lobe. Minimal patchy airspace opacities anteriorly in the right middle lobe. Masslike pleural-based opacity in the inferior left lower lobe measuring approximately 1.8 x 2.1 x 2.0 cm.  Multiple bilateral lung nodules, many of  which are new since the prior CT. The largest nodule is in the inferior left upper lobe and measures approximately 5 mm (series 3, image 30).  No associated pleural effusions.  No pleural masses.  Normal heart size with extensive 3 vessel coronary atherosclerosis. Severe atherosclerosis involving  the thoracic and upper abdominal aorta and their visualized branches without evidence of aneurysm. No visible mediastinal, hilar or axillary lymphadenopathy. Visualized thyroid gland unremarkable.  Visualized upper abdomen unremarkable. 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  IMPRESSION: 1. 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 2. Approximate 2 cm mass in the inferior left lower lobe. 3. Numerous bilateral pulmonary nodules, the largest approximating 5 mm in the left lower lobe. 4. Severe COPD/emphysema. 5. No visible mediastinal, hilar or axillary lymphadenopathy, allowing for the unenhanced technique.   Electronically Signed   By: Evangeline Dakin M.D.   On: 01/22/2015 08:29   Ct Chest W Contrast  01/22/2015   CLINICAL DATA:  Right upper lobe lung mass with productive cough and chest pain.  EXAM: CT CHEST, ABDOMEN, AND PELVIS WITH CONTRAST  TECHNIQUE: Multidetector CT imaging of the chest, abdomen and pelvis was performed following the standard protocol during bolus administration of intravenous contrast.  CONTRAST:  1103m OMNIPAQUE IOHEXOL 300 MG/ML  SOLN  COMPARISON:  Unenhanced CT of the chest earlier today at 737 hours and prior CT on 10/14/2012.  FINDINGS: CT CHEST FINDINGS  Geographic area of peripheral consolidation in the lateral and posterior right upper lobe spans over a region measuring roughly 3.7 x 7.3 x 4.4 cm. Areas of gas formation are consistent with necrosis and there is a small posterior component of focal fluid attenuation measuring roughly 1.5 cm in diameter. Based on the contrast-enhanced CT, this may represent a necrotic pneumonia and is not necessarily representative of malignancy. The small area of liquefaction may represent early focal abscess formation. Additional ground-glass and alveolar opacity in the anterior right upper  lobe and right middle lobe likely represent acute infection and extension of infiltrate. Clinical and imaging follow-up is recommended. If the process does not resolve, or worsens on antibiotic therapy, Pulmonary Medicine evaluation is recommended.  The area of mass-like opacity measured in the anterior aspect of the lateral left lower lobe is again visualized. This is also somewhat geographic in appearance and may not represent a focal mass. Focal atelectasis and scarring is possible. Malignancy is not completely excluded. A 6-7 mm anterior right upper lobe nodule is stable since 2014. A 5 mm anterior left upper lobe nodule is stable since 2014. A 5 mm inferior lingular nodule is stable since 2014. 4 mm subpleural nodule in the right middle lobe is stable since 2014. There are 2 tiny subpleural nodules in the posterior right lower lobe. One of these located more medially was not as apparent on the 2014 study. Tiny lateral subpleural nodules in the right lower lobe appears stable since the prior study in 2014.  No airway obstruction. Mildly prominent right hilar lymph node measures approximately 11 mm in short axis. There are a few tiny additional mediastinal nodes with no enlarged lymph nodes seen. No pleural or pericardial fluid is seen. The heart size is normal. Mild spondylosis present of the thoracic spine.  CT ABDOMEN AND PELVIS FINDINGS  Solid organs show no evidence of metastatic disease. The liver, pancreas, spleen, adrenal glands and kidneys are normal. There is a small  hiatal hernia. The gallbladder has been removed. No enlarged lymph nodes are seen.  Bowel is unremarkable and shows no evidence of obstruction or lesion. No free fluid, free air or abscess is identified. Small right inguinal hernia contains fat. The bladder is unremarkable. The uterus has been removed. The abdominal aorta and iliac arteries show atherosclerotic calcifications without aneurysmal disease or significant obstructive disease  identified. No bony lesions are identified.  IMPRESSION: 1. The geographic area of peripheral consolidation in the right upper lobe has an appearance most likely consistent with necrotic pneumonia with additional small early abscess formation measuring 1.5 cm within this region. Although malignancy cannot be excluded, necrotic pneumonia is favored. Additional alveolar and ground-glass infiltrate extends into the anterior right upper lobe and right middle lobe. If the process does not resolve or worsens, pulmonary Medicine evaluation is recommended. Associated 11 mm right hilar lymph node may be reactive or metastatic. 2. The peripheral lateral left lower lobe opacity may represent focal atelectasis and scarring. Malignancy cannot be excluded, however. Once the pneumonia of the right upper lobe has been adequately treated, if there is persistent opacity in the left lower lobe, PET scan evaluation may be helpful. 3. All of the subcentimeter small nodules identified bilaterally are largely peripheral/subpleural and are stable since the CT in 2014. One of the right lower lobe nodules may be new/more prominent but only measures a few mm in diameter. All of these tiny nodules are most likely postinflammatory and not representative of metastatic nodules. 4. No evidence of pathology in the abdomen or pelvis. No acute findings or evidence of metastatic disease. Incidental small hiatal hernia and small right inguinal hernia containing fat.   Electronically Signed   By: Aletta Edouard M.D.   On: 01/22/2015 13:45   Ct Abdomen Pelvis W Contrast  01/22/2015   CLINICAL DATA:  Right upper lobe lung mass with productive cough and chest pain.  EXAM: CT CHEST, ABDOMEN, AND PELVIS WITH CONTRAST  TECHNIQUE: Multidetector CT imaging of the chest, abdomen and pelvis was performed following the standard protocol during bolus administration of intravenous contrast.  CONTRAST:  170m OMNIPAQUE IOHEXOL 300 MG/ML  SOLN  COMPARISON:   Unenhanced CT of the chest earlier today at 737 hours and prior CT on 10/14/2012.  FINDINGS: CT CHEST FINDINGS  Geographic area of peripheral consolidation in the lateral and posterior right upper lobe spans over a region measuring roughly 3.7 x 7.3 x 4.4 cm. Areas of gas formation are consistent with necrosis and there is a small posterior component of focal fluid attenuation measuring roughly 1.5 cm in diameter. Based on the contrast-enhanced CT, this may represent a necrotic pneumonia and is not necessarily representative of malignancy. The small area of liquefaction may represent early focal abscess formation. Additional ground-glass and alveolar opacity in the anterior right upper lobe and right middle lobe likely represent acute infection and extension of infiltrate. Clinical and imaging follow-up is recommended. If the process does not resolve, or worsens on antibiotic therapy, Pulmonary Medicine evaluation is recommended.  The area of mass-like opacity measured in the anterior aspect of the lateral left lower lobe is again visualized. This is also somewhat geographic in appearance and may not represent a focal mass. Focal atelectasis and scarring is possible. Malignancy is not completely excluded. A 6-7 mm anterior right upper lobe nodule is stable since 2014. A 5 mm anterior left upper lobe nodule is stable since 2014. A 5 mm inferior lingular nodule is stable since 2014. 4 mm  subpleural nodule in the right middle lobe is stable since 2014. There are 2 tiny subpleural nodules in the posterior right lower lobe. One of these located more medially was not as apparent on the 2014 study. Tiny lateral subpleural nodules in the right lower lobe appears stable since the prior study in 2014.  No airway obstruction. Mildly prominent right hilar lymph node measures approximately 11 mm in short axis. There are a few tiny additional mediastinal nodes with no enlarged lymph nodes seen. No pleural or pericardial fluid is  seen. The heart size is normal. Mild spondylosis present of the thoracic spine.  CT ABDOMEN AND PELVIS FINDINGS  Solid organs show no evidence of metastatic disease. The liver, pancreas, spleen, adrenal glands and kidneys are normal. There is a small hiatal hernia. The gallbladder has been removed. No enlarged lymph nodes are seen.  Bowel is unremarkable and shows no evidence of obstruction or lesion. No free fluid, free air or abscess is identified. Small right inguinal hernia contains fat. The bladder is unremarkable. The uterus has been removed. The abdominal aorta and iliac arteries show atherosclerotic calcifications without aneurysmal disease or significant obstructive disease identified. No bony lesions are identified.  IMPRESSION: 1. The geographic area of peripheral consolidation in the right upper lobe has an appearance most likely consistent with necrotic pneumonia with additional small early abscess formation measuring 1.5 cm within this region. Although malignancy cannot be excluded, necrotic pneumonia is favored. Additional alveolar and ground-glass infiltrate extends into the anterior right upper lobe and right middle lobe. If the process does not resolve or worsens, pulmonary Medicine evaluation is recommended. Associated 11 mm right hilar lymph node may be reactive or metastatic. 2. The peripheral lateral left lower lobe opacity may represent focal atelectasis and scarring. Malignancy cannot be excluded, however. Once the pneumonia of the right upper lobe has been adequately treated, if there is persistent opacity in the left lower lobe, PET scan evaluation may be helpful. 3. All of the subcentimeter small nodules identified bilaterally are largely peripheral/subpleural and are stable since the CT in 2014. One of the right lower lobe nodules may be new/more prominent but only measures a few mm in diameter. All of these tiny nodules are most likely postinflammatory and not representative of  metastatic nodules. 4. No evidence of pathology in the abdomen or pelvis. No acute findings or evidence of metastatic disease. Incidental small hiatal hernia and small right inguinal hernia containing fat.   Electronically Signed   By: Aletta Edouard M.D.   On: 01/22/2015 13:45  HISTORICAL MICRO/IMAGING  Assessment  [name] is a 58 y.o. female hx of COPD, HTN PVD ongoing tobacco abuse admitted 5/21 with 2 weeks cough and pleurtiic chest pain.  Found to have R sided infiltrate vs mass.  On further questioning reports 3 months of slowly progressive pleurtic pain.   PPD is negative CT shows impressive infiltrate, likely necrotizing PNA. No real TB risk factors but could certainly be TB.    Rec Cont zosyn and vanco pending sputum results Check HIV

## 2015-01-24 NOTE — Care Management Note (Addendum)
Case Management Note  Patient Details  Name: Diamonds Lippard MRN: [id number] Date of Birth: [birth year]  Subjective/Objective:     59yo Mrs Emiliana Blaize was admitted  01/22/15 per necrotizing pneumonia. PCP is Dr Leta Baptist. Pharmacy is [facility] [address]. Reports that she has no assistive equipment at home. No home oxygen.  Ambulating to bathroom next to her hospital room without assistance. Currently on 2L N/C and continues to run a low grade temp.       Action/Plan:   Expected Discharge Date:                  Expected Discharge Plan:     In-House Referral:  PCP / [facility]  Discharge planning Services  Follow-up appt scheduled  Post Acute Care Choice:  NA Choice offered to:  NA  DME Arranged:    DME Agency:     HH Arranged:    HH Agency:     Status of Service:  In process, will continue to follow  Medicare Important Message Given:  Yes Date Medicare IM Given:  01/24/15 Medicare IM give by:  Hester Mates, RN Date Additional Medicare IM Given:    Additional Medicare Important Message give by:     If discussed at [place] of Stay Meetings, dates discussed:    Additional Comments:  Nakeysha Pasqual A, RN 01/24/2015, 2:17 PM

## 2015-01-25 LAB — EXPECTORATED SPUTUM ASSESSMENT W REFEX TO RESP CULTURE

## 2015-01-25 LAB — EXPECTORATED SPUTUM ASSESSMENT W GRAM STAIN, RFLX TO RESP C

## 2015-01-25 MED ORDER — ZOLPIDEM TARTRATE 5 MG PO TABS
5.0000 mg | ORAL_TABLET | Freq: Every evening | ORAL | Status: DC | PRN
  Administered 2015-01-26 – 2015-02-01 (×6): 5 mg via ORAL
  Filled 2015-01-25 (×8): qty 1

## 2015-01-25 NOTE — Progress Notes (Signed)
Dr. Lavetta Nielsen notified patient wants something for sleep. MD to put in orders.

## 2015-01-25 NOTE — Progress Notes (Signed)
[facility]                                                                                                                                                                                            Patient Demographics   [name], is a 59 y.o. female, DOB - 08-02-1956, IPJ:[id number]  Admit date - 01/22/2015   Admitting Physician Dustin Flock, MD  Outpatient Primary MD for the patient is No primary care provider on file.  LOS - 3  Chief Complaint  Patient presents with  . Pleurisy   still hurts to cough. He is currently on oxygen therapy. Review of Systems:   CONSTITUTIONAL: No documented fever. No fatigue, weakness. No weight gain, no weight loss.  EYES: No blurry or double vision.  ENT: No tinnitus. No postnasal drip. No redness of the oropharynx.  RESPIRATORY: Dry cough, no wheeze, no hemoptysis. Mild dyspnea.  CARDIOVASCULAR: Right-sided chest pain. No orthopnea. No palpitations. No syncope.  GASTROINTESTINAL: No nausea, no vomiting or diarrhea. No abdominal pain. No melena or hematochezia.  GENITOURINARY: No dysuria or hematuria.  ENDOCRINE: No polyuria or nocturia. No heat or cold intolerance.  HEMATOLOGY: No anemia. No bruising. No bleeding.  INTEGUMENTARY: No rashes. No lesions.  MUSCULOSKELETAL: No arthritis. No swelling. No gout.  NEUROLOGIC: No numbness, tingling, or ataxia. No seizure-type activity.  PSYCHIATRIC: No anxiety. No insomnia. No ADD.    Vitals:   Filed Vitals:   01/25/15 0207 01/25/15 0611 01/25/15 0630 01/25/15 1421  BP:  134/67  120/58  Pulse:  85  71  Temp: 99.8 F (37.7 C) 98.3 F (36.8 C)  98.3 F (36.8 C)  TempSrc: Oral Oral  Oral  Resp:    18  Height:      Weight:      SpO2:  89% 98% 97%    Wt Readings from Last 3 Encounters:  01/22/15 57.267 kg (126 lb 4 oz)     Intake/Output Summary (Last 24 hours) at 01/25/15 1544 Last data filed at 01/25/15 1520  Gross per 24 hour   Intake   3151 ml  Output   2250 ml  Net    901 ml    Physical Exam:   GENERAL: Pleasant-appearing in no apparent distress.  HEAD, EYES, EARS, NOSE AND THROAT: Atraumatic, normocephalic. Extraocular muscles are intact. Pupils equal and reactive to light. Sclerae anicteric. No conjunctival injection. No oro-pharyngeal erythema.  NECK: Supple. There is no jugular venous distention. No bruits, no lymphadenopathy, no thyromegaly.  HEART: Regular rate and rhythm, tachycardic. No murmurs, no rubs, no clicks.  LUNGS: Occasional rhonchi.  No rales or  No wheezes.  ABDOMEN: Soft, flat, nontender, nondistended. Has good bowel sounds. No hepatosplenomegaly appreciated.  EXTREMITIES: No evidence of any cyanosis, clubbing, or peripheral edema.  +2 pedal and radial pulses bilaterally.  NEUROLOGIC: The patient is alert, awake, and oriented x3 with no focal motor or sensory deficits appreciated bilaterally.  SKIN: Moist and warm with no rashes appreciated.  Psych: Not anxious, depressed LN: No inguinal LN enlargement    Antibiotics   Anti-infectives    Start     Dose/Rate Route Frequency Ordered Stop   01/22/15 1600  piperacillin-tazobactam (ZOSYN) IVPB 3.375 g     3.375 g 12.5 mL/hr over 240 Minutes Intravenous 3 times per day 01/22/15 1352     01/22/15 1430  azithromycin (ZITHROMAX) 500 mg in dextrose 5 % 250 mL IVPB     500 mg 250 mL/hr over 60 Minutes Intravenous  Once 01/22/15 1351 01/22/15 1606   01/22/15 1400  piperacillin-tazobactam (ZOSYN) IVPB 3.375 g  Status:  Discontinued     3.375 g 12.5 mL/hr over 240 Minutes Intravenous 3 times per day 01/22/15 0951 01/22/15 1352   01/22/15 1200  vancomycin (VANCOCIN) IVPB 1000 mg/200 mL premix     1,000 mg 200 mL/hr over 60 Minutes Intravenous Every 18 hours 01/22/15 1124     01/22/15 1000  vancomycin (VANCOCIN) IVPB 1000 mg/200 mL premix  Status:  Discontinued     1,000 mg 200 mL/hr over 60 Minutes Intravenous Every 24 hours 01/22/15 0951  01/22/15 1124   01/22/15 0845  cefTRIAXone (ROCEPHIN) 1 g in dextrose 5 % 50 mL IVPB     1 g 100 mL/hr over 30 Minutes Intravenous  Once 01/22/15 0843 01/22/15 0931   01/22/15 0845  azithromycin (ZITHROMAX) 500 mg in dextrose 5 % 250 mL IVPB  Status:  Discontinued     500 mg 250 mL/hr over 60 Minutes Intravenous  Once 01/22/15 0843 01/22/15 1351      Medications   Scheduled Meds: . atorvastatin  80 mg Oral BH-q7a  . ferrous sulfate  325 mg Oral Daily  . guaiFENesin  600 mg Oral BID  . losartan  50 mg Oral Daily  . metoprolol  100 mg Oral Daily  . piperacillin-tazobactam (ZOSYN)  IV  3.375 g Intravenous 3 times per day  . polyethylene glycol  17 g Oral Daily  . vancomycin  1,000 mg Intravenous Q18H   Continuous Infusions: . sodium chloride 75 mL/hr at 01/25/15 1038   PRN Meds:.acetaminophen **OR** acetaminophen, guaiFENesin-codeine, HYDROcodone-acetaminophen, lactulose, morphine injection, nicotine polacrilex, ondansetron **OR** ondansetron (ZOFRAN) IV, senna-docusate   Data Review:   Micro Results Recent Results (from the past 240 hour(s))  Culture, blood (routine x 2)     Status: None (Preliminary result)   Collection Time: 01/22/15  9:02 AM  Result Value Ref Range Status   Specimen Description BLOOD  Final   Special Requests NONE  Final   Culture NO GROWTH 3 DAYS  Final   Report Status PENDING  Incomplete  Culture, blood (routine x 2)     Status: None (Preliminary result)   Collection Time: 01/22/15  9:02 AM  Result Value Ref Range Status   Specimen Description BLOOD  Final   Special Requests NONE  Final   Culture NO GROWTH 3 DAYS  Final   Report Status PENDING  Incomplete  Culture, sputum-assessment     Status: None   Collection Time: 01/25/15  2:47 AM  Result Value Ref Range Status  Specimen Description SPU  Final   Special Requests NONE  Final   Sputum evaluation   Final    THIS SPECIMEN IS ACCEPTABLE FOR SPUTUM CULTURE CTJ 01/25/15    Report Status  01/25/2015 FINAL  Final    Radiology Reports Dg Chest 2 View  01/22/2015   CLINICAL DATA:  Pleurisy. Right-sided pain, back pain for 1 week. History of COPD.  EXAM: CHEST  2 VIEW  COMPARISON:  10/15/2013  FINDINGS: Within the posterior right upper lobe is a a 4.7 cm rounded masslike opacity, with surrounding ill-defined density. Additional ill-defined density in the right middle lobe. Underlying emphysema again seen. Probable atelectasis or scarring at the left costophrenic angle. The heart size and mediastinal contours are normal, mild atherosclerosis of the thoracic aorta. No pleural effusion. No pneumothorax. There is degenerative change in the spine.  IMPRESSION: Rounded 4.7 since masslike opacity in the right upper lobe, with surrounding ill-defined density. This may reflect pneumonia versus neoplasm. There is additional opacity in the right middle lobe concerning for pneumonia.  Chest CT correlation versus radiographic follow-up recommended. While traditional follow-up for pneumonia would be in 3-4 weeks after course of antibiotics, given the masslike appearance, shorter interval follow-up is recommended in 1-2 weeks.   Electronically Signed   By: Jeb Levering M.D.   On: 01/22/2015 06:42   Ct Chest Wo Contrast  01/22/2015   CLINICAL DATA:  Five day history of right-sided chest pain, worsening over the past 2 days. Right upper lobe lung mass on earlier chest x-ray.  EXAM: CT CHEST WITHOUT CONTRAST  TECHNIQUE: Multidetector CT imaging of the chest was performed following the standard protocol without IV contrast.  COMPARISON:  CT chest 10/14/2012. Two-view chest x-ray earlier same date.  FINDINGS: Lack of intravenous contrast makes the examination less sensitive than it would be otherwise. Severe emphysematous changes throughout both lungs with dense masslike opacity in the right upper lobe. Gas bubbles within the opacity. Extensive bullous changes are present in the right upper lobe in the area of  the opacity. Patchy airspace opacities are present more inferiorly in the right lower lobe. Minimal patchy airspace opacities anteriorly in the right middle lobe. Masslike pleural-based opacity in the inferior left lower lobe measuring approximately 1.8 x 2.1 x 2.0 cm.  Multiple bilateral lung nodules, many of which are new since the prior CT. The largest nodule is in the inferior left upper lobe and measures approximately 5 mm (series 3, image 30).  No associated pleural effusions.  No pleural masses.  Normal heart size with extensive 3 vessel coronary atherosclerosis. Severe atherosclerosis involving the thoracic and upper abdominal aorta and their visualized branches without evidence of aneurysm. No visible mediastinal, hilar or axillary lymphadenopathy. Visualized thyroid gland unremarkable.  Visualized upper abdomen unremarkable. 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  IMPRESSION: 1. 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 2. Approximate 2 cm mass in the inferior left lower lobe. 3. Numerous bilateral pulmonary nodules, the largest approximating 5 mm in the left lower lobe. 4. Severe COPD/emphysema. 5. No visible mediastinal, hilar or axillary lymphadenopathy, allowing for the unenhanced technique.   Electronically Signed   By: Evangeline Dakin M.D.   On: 01/22/2015 08:29   Ct Chest W Contrast  01/22/2015   CLINICAL DATA:  Right upper lobe lung mass with productive cough and chest pain.  EXAM: CT CHEST, ABDOMEN, AND PELVIS WITH  CONTRAST  TECHNIQUE: Multidetector CT imaging of the chest, abdomen and pelvis was performed following the standard protocol during bolus administration of intravenous contrast.  CONTRAST:  148mL OMNIPAQUE IOHEXOL 300 MG/ML  SOLN  COMPARISON:  Unenhanced CT of the chest earlier today at 737 hours and prior CT on 10/14/2012.   FINDINGS: CT CHEST FINDINGS  Geographic area of peripheral consolidation in the lateral and posterior right upper lobe spans over a region measuring roughly 3.7 x 7.3 x 4.4 cm. Areas of gas formation are consistent with necrosis and there is a small posterior component of focal fluid attenuation measuring roughly 1.5 cm in diameter. Based on the contrast-enhanced CT, this may represent a necrotic pneumonia and is not necessarily representative of malignancy. The small area of liquefaction may represent early focal abscess formation. Additional ground-glass and alveolar opacity in the anterior right upper lobe and right middle lobe likely represent acute infection and extension of infiltrate. Clinical and imaging follow-up is recommended. If the process does not resolve, or worsens on antibiotic therapy, Pulmonary Medicine evaluation is recommended.  The area of mass-like opacity measured in the anterior aspect of the lateral left lower lobe is again visualized. This is also somewhat geographic in appearance and may not represent a focal mass. Focal atelectasis and scarring is possible. Malignancy is not completely excluded. A 6-7 mm anterior right upper lobe nodule is stable since 2014. A 5 mm anterior left upper lobe nodule is stable since 2014. A 5 mm inferior lingular nodule is stable since 2014. 4 mm subpleural nodule in the right middle lobe is stable since 2014. There are 2 tiny subpleural nodules in the posterior right lower lobe. One of these located more medially was not as apparent on the 2014 study. Tiny lateral subpleural nodules in the right lower lobe appears stable since the prior study in 2014.  No airway obstruction. Mildly prominent right hilar lymph node measures approximately 11 mm in short axis. There are a few tiny additional mediastinal nodes with no enlarged lymph nodes seen. No pleural or pericardial fluid is seen. The heart size is normal. Mild spondylosis present of the thoracic spine.  CT  ABDOMEN AND PELVIS FINDINGS  Solid organs show no evidence of metastatic disease. The liver, pancreas, spleen, adrenal glands and kidneys are normal. There is a small hiatal hernia. The gallbladder has been removed. No enlarged lymph nodes are seen.  Bowel is unremarkable and shows no evidence of obstruction or lesion. No free fluid, free air or abscess is identified. Small right inguinal hernia contains fat. The bladder is unremarkable. The uterus has been removed. The abdominal aorta and iliac arteries show atherosclerotic calcifications without aneurysmal disease or significant obstructive disease identified. No bony lesions are identified.  IMPRESSION: 1. The geographic area of peripheral consolidation in the right upper lobe has an appearance most likely consistent with necrotic pneumonia with additional small early abscess formation measuring 1.5 cm within this region. Although malignancy cannot be excluded, necrotic pneumonia is favored. Additional alveolar and ground-glass infiltrate extends into the anterior right upper lobe and right middle lobe. If the process does not resolve or worsens, pulmonary Medicine evaluation is recommended. Associated 11 mm right hilar lymph node may be reactive or metastatic. 2. The peripheral lateral left lower lobe opacity may represent focal atelectasis and scarring. Malignancy cannot be excluded, however. Once the pneumonia of the right upper lobe has been adequately treated, if there is persistent opacity in the left lower lobe, PET scan evaluation may be  helpful. 3. All of the subcentimeter small nodules identified bilaterally are largely peripheral/subpleural and are stable since the CT in 2014. One of the right lower lobe nodules may be new/more prominent but only measures a few mm in diameter. All of these tiny nodules are most likely postinflammatory and not representative of metastatic nodules. 4. No evidence of pathology in the abdomen or pelvis. No acute findings  or evidence of metastatic disease. Incidental small hiatal hernia and small right inguinal hernia containing fat.   Electronically Signed   By: Aletta Edouard M.D.   On: 01/22/2015 13:45   Ct Abdomen Pelvis W Contrast  01/22/2015   CLINICAL DATA:  Right upper lobe lung mass with productive cough and chest pain.  EXAM: CT CHEST, ABDOMEN, AND PELVIS WITH CONTRAST  TECHNIQUE: Multidetector CT imaging of the chest, abdomen and pelvis was performed following the standard protocol during bolus administration of intravenous contrast.  CONTRAST:  169mL OMNIPAQUE IOHEXOL 300 MG/ML  SOLN  COMPARISON:  Unenhanced CT of the chest earlier today at 737 hours and prior CT on 10/14/2012.  FINDINGS: CT CHEST FINDINGS  Geographic area of peripheral consolidation in the lateral and posterior right upper lobe spans over a region measuring roughly 3.7 x 7.3 x 4.4 cm. Areas of gas formation are consistent with necrosis and there is a small posterior component of focal fluid attenuation measuring roughly 1.5 cm in diameter. Based on the contrast-enhanced CT, this may represent a necrotic pneumonia and is not necessarily representative of malignancy. The small area of liquefaction may represent early focal abscess formation. Additional ground-glass and alveolar opacity in the anterior right upper lobe and right middle lobe likely represent acute infection and extension of infiltrate. Clinical and imaging follow-up is recommended. If the process does not resolve, or worsens on antibiotic therapy, Pulmonary Medicine evaluation is recommended.  The area of mass-like opacity measured in the anterior aspect of the lateral left lower lobe is again visualized. This is also somewhat geographic in appearance and may not represent a focal mass. Focal atelectasis and scarring is possible. Malignancy is not completely excluded. A 6-7 mm anterior right upper lobe nodule is stable since 2014. A 5 mm anterior left upper lobe nodule is stable since  2014. A 5 mm inferior lingular nodule is stable since 2014. 4 mm subpleural nodule in the right middle lobe is stable since 2014. There are 2 tiny subpleural nodules in the posterior right lower lobe. One of these located more medially was not as apparent on the 2014 study. Tiny lateral subpleural nodules in the right lower lobe appears stable since the prior study in 2014.  No airway obstruction. Mildly prominent right hilar lymph node measures approximately 11 mm in short axis. There are a few tiny additional mediastinal nodes with no enlarged lymph nodes seen. No pleural or pericardial fluid is seen. The heart size is normal. Mild spondylosis present of the thoracic spine.  CT ABDOMEN AND PELVIS FINDINGS  Solid organs show no evidence of metastatic disease. The liver, pancreas, spleen, adrenal glands and kidneys are normal. There is a small hiatal hernia. The gallbladder has been removed. No enlarged lymph nodes are seen.  Bowel is unremarkable and shows no evidence of obstruction or lesion. No free fluid, free air or abscess is identified. Small right inguinal hernia contains fat. The bladder is unremarkable. The uterus has been removed. The abdominal aorta and iliac arteries show atherosclerotic calcifications without aneurysmal disease or significant obstructive disease identified. No bony lesions  are identified.  IMPRESSION: 1. The geographic area of peripheral consolidation in the right upper lobe has an appearance most likely consistent with necrotic pneumonia with additional small early abscess formation measuring 1.5 cm within this region. Although malignancy cannot be excluded, necrotic pneumonia is favored. Additional alveolar and ground-glass infiltrate extends into the anterior right upper lobe and right middle lobe. If the process does not resolve or worsens, pulmonary Medicine evaluation is recommended. Associated 11 mm right hilar lymph node may be reactive or metastatic. 2. The peripheral lateral  left lower lobe opacity may represent focal atelectasis and scarring. Malignancy cannot be excluded, however. Once the pneumonia of the right upper lobe has been adequately treated, if there is persistent opacity in the left lower lobe, PET scan evaluation may be helpful. 3. All of the subcentimeter small nodules identified bilaterally are largely peripheral/subpleural and are stable since the CT in 2014. One of the right lower lobe nodules may be new/more prominent but only measures a few mm in diameter. All of these tiny nodules are most likely postinflammatory and not representative of metastatic nodules. 4. No evidence of pathology in the abdomen or pelvis. No acute findings or evidence of metastatic disease. Incidental small hiatal hernia and small right inguinal hernia containing fat.   Electronically Signed   By: Aletta Edouard M.D.   On: 01/22/2015 13:45     CBC  Recent Labs Lab 01/22/15 0604 01/23/15 0424 01/24/15 0616  WBC 12.2* 12.0* 8.5  HGB 11.3* 10.3* 9.0*  HCT 34.0* 31.7* 27.4*  PLT 272 228 222  MCV 94.5 94.7 95.3  MCH 31.3 30.8 31.4  MCHC 33.1 32.5 32.9  RDW 15.1* 14.8* 15.2*    Chemistries   Recent Labs Lab 01/22/15 0604 01/23/15 0424 01/24/15 0616  NA 134* 134* 137  K 3.7 3.9 3.5  CL 100* 101 105  CO2 23 24 24   GLUCOSE 106* 100* 99  BUN 15 16 9   CREATININE 1.05* 1.11* 0.98  CALCIUM 9.2 8.4* 8.3*   ------------------------------------------------------------------------------------------------------------------ estimated creatinine clearance is 53.4 mL/min (by C-G formula based on Cr of 0.98). ------------------------------------------------------------------------------------------------------------------ No results for input(s): HGBA1C in the last 72 hours. ------------------------------------------------------------------------------------------------------------------ No results for input(s): CHOL, HDL, LDLCALC, TRIG, CHOLHDL, LDLDIRECT in the last 72  hours. ------------------------------------------------------------------------------------------------------------------ No results for input(s): TSH, T4TOTAL, T3FREE, THYROIDAB in the last 72 hours.  Invalid input(s): FREET3 ------------------------------------------------------------------------------------------------------------------ No results for input(s): VITAMINB12, FOLATE, FERRITIN, TIBC, IRON, RETICCTPCT in the last 72 hours.  Coagulation profile No results for input(s): INR, PROTIME in the last 168 hours.  No results for input(s): DDIMER in the last 72 hours.  Cardiac Enzymes  Recent Labs Lab 01/22/15 0604  TROPONINI <0.03   ------------------------------------------------------------------------------------------------------------------ Invalid input(s): POCBNP    Assessment & Plan   Active Problems:   Necrotizing pneumonia IMPRESSION AND PLAN: Patient is a 59 year old African-American female with history of nicotine addiction, COPD presents with right-sided chest pain noted to have a mass as well as possible pneumonia.  #1. Right-sided pleuritic chest pain: CT of the chest with IV contrast favoring more infectious causes and malignancy. Seen by pulmonary , appreciated Dr. Blane Ohara input. 2 new IV anabiotic's,  await AFB sputum analysis.   #2. COPD; there is no evidence of acute exacerbation continue her on Advair and Spiriva. And Combivent inhalers as needed.  #3. Peripheral vascular disease;  resume aspirin  #4. Nicotine addiction smoking cessation done     Code Status Orders        Start  Ordered   01/22/15 0947  Full code   Continuous     01/22/15 0948      Family Communication: Patient   Disposition Plan: Home   Procedures none   Consults pulmonary consult pending   DVT Prophylaxis  SCDs  Lab Results  Component Value Date   PLT 222 01/24/2015     Time Spent in minutes 32 minutes   Dustin Flock M.D on 01/25/2015 at  3:44 PM  Between 7am to 6pm - Pager - [phone]  After 6pm go to [email or web address] - password [email or web address] [facility]   Office  [phone]

## 2015-01-25 NOTE — Progress Notes (Signed)
Date of Admission:  01/22/2015   ID: [name] is a 59 y.o. female with  Cavitary pna  Active Problems:   Necrotizing pneumonia  Subjective: Low grade temp 100.7  Medications:  . atorvastatin  80 mg Oral BH-q7a  . ferrous sulfate  325 mg Oral Daily  . guaiFENesin  600 mg Oral BID  . losartan  50 mg Oral Daily  . metoprolol  100 mg Oral Daily  . piperacillin-tazobactam (ZOSYN)  IV  3.375 g Intravenous 3 times per day  . polyethylene glycol  17 g Oral Daily  . vancomycin  1,000 mg Intravenous Q18H    Objective: Vital signs in last 24 hours: Temp:  [98.3 F (36.8 C)-100.7 F (38.2 C)] 98.3 F (36.8 C) (05/24 1421) Pulse Rate:  [71-85] 71 (05/24 1421) Resp:  [18] 18 (05/24 1421) BP: (119-134)/(58-67) 120/58 mmHg (05/24 1421) SpO2:  [89 %-98 %] 97 % (05/24 1421) GENERAL: lying in the bed with no acute distress, but diaphoretic EYES: Pupils equal, round, reactive to light and accommodation. No scleral icterus. Extraocular muscles intact.  HEENT: Head atraumatic, normocephalic. Oropharynx and nasopharynx clear.  NECK: Supple, no jugular venous distention. No thyroid enlargement, no tenderness.  LUNGS: Diminished breath sounds bilaterally, no wheezing, rales, positive rhonchi no crepitation. No use of accessory muscles of respiration.  CARDIOVASCULAR: S1, S2 normal. No murmurs, rubs, or gallops.  ABDOMEN: Soft, nontender, nondistended. Bowel sounds present. No organomegaly or mass.  EXTREMITIES: No pedal edema, cyanosis, or clubbing.  NEUROLOGIC: Cranial nerves II through XII are intact. Muscle strength 5/5 in all extremities. Sensation intact. Gait not checked.  PSYCHIATRIC: The patient is alert and oriented x 3.  SKIN: No obvious rash, lesion, or ulcer  Lab Results  Recent Labs  01/23/15 0424 01/24/15 0616  WBC 12.0* 8.5  HGB 10.3* 9.0*  HCT 31.7* 27.4*  NA 134* 137  K 3.9 3.5  CL 101 105  CO2 24 24  BUN 16 9  CREATININE 1.11* 0.98    Microbiology: Results for orders placed or performed during the hospital encounter of 01/22/15  Culture, blood (routine x 2)     Status: None (Preliminary result)   Collection Time: 01/22/15  9:02 AM  Result Value Ref Range Status   Specimen Description BLOOD  Final   Special Requests NONE  Final   Culture NO GROWTH 3 DAYS  Final   Report Status PENDING  Incomplete  Culture, blood (routine x 2)     Status: None (Preliminary result)   Collection Time: 01/22/15  9:02 AM  Result Value Ref Range Status   Specimen Description BLOOD  Final   Special Requests NONE  Final   Culture NO GROWTH 3 DAYS  Final   Report Status PENDING  Incomplete  Culture, sputum-assessment     Status: None   Collection Time: 01/25/15  2:47 AM  Result Value Ref Range Status   Specimen Description SPU  Final   Special Requests NONE  Final   Sputum evaluation   Final    THIS SPECIMEN IS ACCEPTABLE FOR SPUTUM CULTURE CTJ 01/25/15    Report Status 01/25/2015 FINAL  Final    Studies/Results:  Dg Chest 2 View  01/22/2015   CLINICAL DATA:  Pleurisy. Right-sided pain, back pain for 1 week. History of COPD.  EXAM: CHEST  2 VIEW  COMPARISON:  10/15/2013  FINDINGS: Within the posterior right upper lobe is a a 4.7 cm rounded masslike opacity, with surrounding ill-defined density. Additional ill-defined density in the  right middle lobe. Underlying emphysema again seen. Probable atelectasis or scarring at the left costophrenic angle. The heart size and mediastinal contours are normal, mild atherosclerosis of the thoracic aorta. No pleural effusion. No pneumothorax. There is degenerative change in the spine.  IMPRESSION: Rounded 4.7 since masslike opacity in the right upper lobe, with surrounding ill-defined density. This may reflect pneumonia versus neoplasm. There is additional opacity in the right middle lobe concerning for pneumonia.  Chest CT correlation versus radiographic follow-up recommended. While traditional  follow-up for pneumonia would be in 3-4 weeks after course of antibiotics, given the masslike appearance, shorter interval follow-up is recommended in 1-2 weeks.   Electronically Signed   By: Jeb Levering M.D.   On: 01/22/2015 06:42   Ct Chest Wo Contrast  01/22/2015   CLINICAL DATA:  Five day history of right-sided chest pain, worsening over the past 2 days. Right upper lobe lung mass on earlier chest x-ray.  EXAM: CT CHEST WITHOUT CONTRAST  TECHNIQUE: Multidetector CT imaging of the chest was performed following the standard protocol without IV contrast.  COMPARISON:  CT chest 10/14/2012. Two-view chest x-ray earlier same date.  FINDINGS: Lack of intravenous contrast makes the examination less sensitive than it would be otherwise. Severe emphysematous changes throughout both lungs with dense masslike opacity in the right upper lobe. Gas bubbles within the opacity. Extensive bullous changes are present in the right upper lobe in the area of the opacity. Patchy airspace opacities are present more inferiorly in the right lower lobe. Minimal patchy airspace opacities anteriorly in the right middle lobe. Masslike pleural-based opacity in the inferior left lower lobe measuring approximately 1.8 x 2.1 x 2.0 cm.  Multiple bilateral lung nodules, many of which are new since the prior CT. The largest nodule is in the inferior left upper lobe and measures approximately 5 mm (series 3, image 30).  No associated pleural effusions.  No pleural masses.  Normal heart size with extensive 3 vessel coronary atherosclerosis. Severe atherosclerosis involving the thoracic and upper abdominal aorta and their visualized branches without evidence of aneurysm. No visible mediastinal, hilar or axillary lymphadenopathy. Visualized thyroid gland unremarkable.  Visualized upper abdomen unremarkable. 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  IMPRESSION: 1. Necrotic tumor peripherally in the right upper  lobe with adjacent pneumonitis is favored over necrotizing pneumonia, given the remaining findings discussed below. 2. Approximate 2 cm mass in the inferior left lower lobe. 3. Numerous bilateral pulmonary nodules, the largest approximating 5 mm in the left lower lobe. 4. Severe COPD/emphysema. 5. No visible mediastinal, hilar or axillary lymphadenopathy, allowing for the unenhanced technique.   Electronically Signed   By: Evangeline Dakin M.D.   On: 01/22/2015 08:29   Ct Chest W Contrast  01/22/2015   CLINICAL DATA:  Right upper lobe lung mass with productive cough and chest pain.  EXAM: CT CHEST, ABDOMEN, AND PELVIS WITH CONTRAST  TECHNIQUE: Multidetector CT imaging of the chest, abdomen and pelvis was performed following the standard protocol during bolus administration of intravenous contrast.  CONTRAST:  121mL OMNIPAQUE IOHEXOL 300 MG/ML  SOLN  COMPARISON:  Unenhanced CT of the chest earlier today at 737 hours and prior CT on 10/14/2012.  FINDINGS: CT CHEST FINDINGS  Geographic area of peripheral consolidation in the lateral and posterior right upper lobe spans over a region measuring roughly 3.7 x 7.3 x 4.4 cm. Areas of gas formation are consistent with necrosis and there is a small posterior component of focal fluid attenuation  measuring roughly 1.5 cm in diameter. Based on the contrast-enhanced CT, this may represent a necrotic pneumonia and is not necessarily representative of malignancy. The small area of liquefaction may represent early focal abscess formation. Additional ground-glass and alveolar opacity in the anterior right upper lobe and right middle lobe likely represent acute infection and extension of infiltrate. Clinical and imaging follow-up is recommended. If the process does not resolve, or worsens on antibiotic therapy, Pulmonary Medicine evaluation is recommended.  The area of mass-like opacity measured in the anterior aspect of the lateral left lower lobe is again visualized. This is also  somewhat geographic in appearance and may not represent a focal mass. Focal atelectasis and scarring is possible. Malignancy is not completely excluded. A 6-7 mm anterior right upper lobe nodule is stable since 2014. A 5 mm anterior left upper lobe nodule is stable since 2014. A 5 mm inferior lingular nodule is stable since 2014. 4 mm subpleural nodule in the right middle lobe is stable since 2014. There are 2 tiny subpleural nodules in the posterior right lower lobe. One of these located more medially was not as apparent on the 2014 study. Tiny lateral subpleural nodules in the right lower lobe appears stable since the prior study in 2014.  No airway obstruction. Mildly prominent right hilar lymph node measures approximately 11 mm in short axis. There are a few tiny additional mediastinal nodes with no enlarged lymph nodes seen. No pleural or pericardial fluid is seen. The heart size is normal. Mild spondylosis present of the thoracic spine.  CT ABDOMEN AND PELVIS FINDINGS  Solid organs show no evidence of metastatic disease. The liver, pancreas, spleen, adrenal glands and kidneys are normal. There is a small hiatal hernia. The gallbladder has been removed. No enlarged lymph nodes are seen.  Bowel is unremarkable and shows no evidence of obstruction or lesion. No free fluid, free air or abscess is identified. Small right inguinal hernia contains fat. The bladder is unremarkable. The uterus has been removed. The abdominal aorta and iliac arteries show atherosclerotic calcifications without aneurysmal disease or significant obstructive disease identified. No bony lesions are identified.  IMPRESSION: 1. The geographic area of peripheral consolidation in the right upper lobe has an appearance most likely consistent with necrotic pneumonia with additional small early abscess formation measuring 1.5 cm within this region. Although malignancy cannot be excluded, necrotic pneumonia is favored. Additional alveolar and  ground-glass infiltrate extends into the anterior right upper lobe and right middle lobe. If the process does not resolve or worsens, pulmonary Medicine evaluation is recommended. Associated 11 mm right hilar lymph node may be reactive or metastatic. 2. The peripheral lateral left lower lobe opacity may represent focal atelectasis and scarring. Malignancy cannot be excluded, however. Once the pneumonia of the right upper lobe has been adequately treated, if there is persistent opacity in the left lower lobe, PET scan evaluation may be helpful. 3. All of the subcentimeter small nodules identified bilaterally are largely peripheral/subpleural and are stable since the CT in 2014. One of the right lower lobe nodules may be new/more prominent but only measures a few mm in diameter. All of these tiny nodules are most likely postinflammatory and not representative of metastatic nodules. 4. No evidence of pathology in the abdomen or pelvis. No acute findings or evidence of metastatic disease. Incidental small hiatal hernia and small right inguinal hernia containing fat.   Electronically Signed   By: Aletta Edouard M.D.   On: 01/22/2015 13:45  Ct Abdomen Pelvis W Contrast  01/22/2015   CLINICAL DATA:  Right upper lobe lung mass with productive cough and chest pain.  EXAM: CT CHEST, ABDOMEN, AND PELVIS WITH CONTRAST  TECHNIQUE: Multidetector CT imaging of the chest, abdomen and pelvis was performed following the standard protocol during bolus administration of intravenous contrast.  CONTRAST:  139mL OMNIPAQUE IOHEXOL 300 MG/ML  SOLN  COMPARISON:  Unenhanced CT of the chest earlier today at 737 hours and prior CT on 10/14/2012.  FINDINGS: CT CHEST FINDINGS  Geographic area of peripheral consolidation in the lateral and posterior right upper lobe spans over a region measuring roughly 3.7 x 7.3 x 4.4 cm. Areas of gas formation are consistent with necrosis and there is a small posterior component of focal fluid attenuation  measuring roughly 1.5 cm in diameter. Based on the contrast-enhanced CT, this may represent a necrotic pneumonia and is not necessarily representative of malignancy. The small area of liquefaction may represent early focal abscess formation. Additional ground-glass and alveolar opacity in the anterior right upper lobe and right middle lobe likely represent acute infection and extension of infiltrate. Clinical and imaging follow-up is recommended. If the process does not resolve, or worsens on antibiotic therapy, Pulmonary Medicine evaluation is recommended.  The area of mass-like opacity measured in the anterior aspect of the lateral left lower lobe is again visualized. This is also somewhat geographic in appearance and may not represent a focal mass. Focal atelectasis and scarring is possible. Malignancy is not completely excluded. A 6-7 mm anterior right upper lobe nodule is stable since 2014. A 5 mm anterior left upper lobe nodule is stable since 2014. A 5 mm inferior lingular nodule is stable since 2014. 4 mm subpleural nodule in the right middle lobe is stable since 2014. There are 2 tiny subpleural nodules in the posterior right lower lobe. One of these located more medially was not as apparent on the 2014 study. Tiny lateral subpleural nodules in the right lower lobe appears stable since the prior study in 2014.  No airway obstruction. Mildly prominent right hilar lymph node measures approximately 11 mm in short axis. There are a few tiny additional mediastinal nodes with no enlarged lymph nodes seen. No pleural or pericardial fluid is seen. The heart size is normal. Mild spondylosis present of the thoracic spine.  CT ABDOMEN AND PELVIS FINDINGS  Solid organs show no evidence of metastatic disease. The liver, pancreas, spleen, adrenal glands and kidneys are normal. There is a small hiatal hernia. The gallbladder has been removed. No enlarged lymph nodes are seen.  Bowel is unremarkable and shows no evidence  of obstruction or lesion. No free fluid, free air or abscess is identified. Small right inguinal hernia contains fat. The bladder is unremarkable. The uterus has been removed. The abdominal aorta and iliac arteries show atherosclerotic calcifications without aneurysmal disease or significant obstructive disease identified. No bony lesions are identified.  IMPRESSION: 1. The geographic area of peripheral consolidation in the right upper lobe has an appearance most likely consistent with necrotic pneumonia with additional small early abscess formation measuring 1.5 cm within this region. Although malignancy cannot be excluded, necrotic pneumonia is favored. Additional alveolar and ground-glass infiltrate extends into the anterior right upper lobe and right middle lobe. If the process does not resolve or worsens, pulmonary Medicine evaluation is recommended. Associated 11 mm right hilar lymph node may be reactive or metastatic. 2. The peripheral lateral left lower lobe opacity may represent focal atelectasis and scarring. Malignancy  cannot be excluded, however. Once the pneumonia of the right upper lobe has been adequately treated, if there is persistent opacity in the left lower lobe, PET scan evaluation may be helpful. 3. All of the subcentimeter small nodules identified bilaterally are largely peripheral/subpleural and are stable since the CT in 2014. One of the right lower lobe nodules may be new/more prominent but only measures a few mm in diameter. All of these tiny nodules are most likely postinflammatory and not representative of metastatic nodules. 4. No evidence of pathology in the abdomen or pelvis. No acute findings or evidence of metastatic disease. Incidental small hiatal hernia and small right inguinal hernia containing fat.   Electronically Signed   By: Aletta Edouard M.D.   On: 01/22/2015 13:45    Assessment/Plan: [name] is a 59 y.o. female hx of COPD, HTN PVD ongoing tobacco abuse  admitted 5/21 with 2 weeks cough and pleurtiic chest pain. Found to have R sided infiltrate vs mass. On further questioning reports 3 months of slowly progressive pleurtic pain. PPD is negative. HIV negative. QFG pending.  CT shows impressive infiltrate, likely necrotizing PNA. No real TB risk factors but could certainly be TB.   Rec AFB are pending- seems that 3 are in process I have sent extra sputum for Xpert PCR testing as well Cont zosyn and vanco pending sputum results  [name], [name]   01/25/2015, 2:49 PM

## 2015-01-25 NOTE — Plan of Care (Signed)
Problem: Discharge Progression Outcomes Goal: Discharge plan in place and appropriate Individualization  Individualization of Care:   Patient prefers to be called [name], from home.  Current smoker.  PMH: COPD, HTN, MI, PVD - controlled with home medications.  Moderate Fall Risk.     Goal: Other Discharge Outcomes/Goals Plan of care progress to goal:  Norco x1 given for c/o of right chest pain, relief noted. Contact precautions discontinued per Dr. Dustin Flock. Patient remains on Airborne precautions to rule out TB. NS at 75 ml/hr infusing. IV antibiotics continued. Up to BR independently. Patient on 2L-O2, sat 97%.

## 2015-01-25 NOTE — Plan of Care (Signed)
Problem: Discharge Progression Outcomes Goal: Discharge plan in place and appropriate Individualization  Outcome: Progressing Address patient as [name]. Patient is from home. Patient is a current smoker. Patient has history of COPD, HTN, MI, PVD, controlled with home medications. Moderate Fall Goal: Other Discharge Outcomes/Goals Outcome: Progressing Patient is alert and oriented, c/o right chest pain. Norco and cough syrup given with relief. Independent in room.

## 2015-01-25 NOTE — Progress Notes (Signed)
ANTIBIOTIC CONSULT NOTE - INITIAL  Pharmacy Consult for Vancomycin Indication: pneumonia  Allergies  Allergen Reactions  . Aspirin Nausea And Vomiting and Other (See Comments)    Pt states aspirin makes her cramp and have to use coated kind.    Patient Measurements: Height: 5\' 4"  (162.6 cm) Weight: 126 lb 4 oz (57.267 kg) IBW/kg (Calculated) : 54.7   Vital Signs: Temp: 98.3 F (36.8 C) (05/24 1421) Temp Source: Oral (05/24 1421) BP: 120/58 mmHg (05/24 1421) Pulse Rate: 71 (05/24 1421)  Labs:  Recent Labs  01/23/15 0424 01/24/15 0616  WBC 12.0* 8.5  HGB 10.3* 9.0*  PLT 228 222  CREATININE 1.11* 0.98   Estimated Creatinine Clearance: 53.4 mL/min (by C-G formula based on Cr of 0.98). No results for input(s): VANCOTROUGH, VANCOPEAK, VANCORANDOM, GENTTROUGH, GENTPEAK, GENTRANDOM, TOBRATROUGH, TOBRAPEAK, TOBRARND, AMIKACINPEAK, AMIKACINTROU, AMIKACIN in the last 72 hours.   Microbiology: Recent Results (from the past 720 hour(s))  Culture, blood (routine x 2)     Status: None (Preliminary result)   Collection Time: 01/22/15  9:02 AM  Result Value Ref Range Status   Specimen Description BLOOD  Final   Special Requests NONE  Final   Culture NO GROWTH 3 DAYS  Final   Report Status PENDING  Incomplete  Culture, blood (routine x 2)     Status: None (Preliminary result)   Collection Time: 01/22/15  9:02 AM  Result Value Ref Range Status   Specimen Description BLOOD  Final   Special Requests NONE  Final   Culture NO GROWTH 3 DAYS  Final   Report Status PENDING  Incomplete  Culture, sputum-assessment     Status: None   Collection Time: 01/25/15  2:47 AM  Result Value Ref Range Status   Specimen Description SPU  Final   Special Requests NONE  Final   Sputum evaluation   Final    THIS SPECIMEN IS ACCEPTABLE FOR SPUTUM CULTURE CTJ 01/25/15    Report Status 01/25/2015 FINAL  Final    Medical History: Past Medical History  Diagnosis Date  . 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negative cardiac cath  . COPD (chronic obstructive pulmonary disease)   . Hypertension   . Peripheral vascular disease   . Nicotine addiction   . Asthma     Medications:  Anti-infectives    Start     Dose/Rate Route Frequency Ordered Stop   01/22/15 1600  piperacillin-tazobactam (ZOSYN) IVPB 3.375 g     3.375 g 12.5 mL/hr over 240 Minutes Intravenous 3 times per day 01/22/15 1352     01/22/15 1430  azithromycin (ZITHROMAX) 500 mg in dextrose 5 % 250 mL IVPB     500 mg 250 mL/hr over 60 Minutes Intravenous  Once 01/22/15 1351 01/22/15 1606   01/22/15 1400  piperacillin-tazobactam (ZOSYN) IVPB 3.375 g  Status:  Discontinued     3.375 g 12.5 mL/hr over 240 Minutes Intravenous 3 times per day 01/22/15 0951 01/22/15 1352   01/22/15 1200  vancomycin (VANCOCIN) IVPB 1000 mg/200 mL premix     1,000 mg 200 mL/hr over 60 Minutes Intravenous Every 18 hours 01/22/15 1124     01/22/15 1000  vancomycin (VANCOCIN) IVPB 1000 mg/200 mL premix  Status:  Discontinued     1,000 mg 200 mL/hr over 60 Minutes Intravenous Every 24 hours 01/22/15 0951 01/22/15 1124   01/22/15 0845  cefTRIAXone (ROCEPHIN) 1 g in dextrose 5 % 50 mL IVPB     1 g 100 mL/hr over 30 Minutes  Intravenous  Once 01/22/15 0843 01/22/15 0931   01/22/15 0845  azithromycin (ZITHROMAX) 500 mg in dextrose 5 % 250 mL IVPB  Status:  Discontinued     500 mg 250 mL/hr over 60 Minutes Intravenous  Once 01/22/15 4097 01/22/15 1351     Assessment: 59 YO female currently ordered vancomycin and zosyn for treatment of pneumonia. 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  Ke 0.046 h^-1    T1/2 15.07 h    Vd 43.2 L  Goal of Therapy:  Vancomycin trough level 15-20 mcg/ml  Plan:  Expected duration 7 days with resolution of temperature and/or normalization of WBC  Trough scheduled for prior to 5th dose today at 1130 was not drawn correctly/reported, re-scheduled for tomorrow AM  prior to 0600 vancomycin dose. Will continue vancomycin 1000 mg IV q18h and check a trough before the 6th dose, 5/25 @0530 .   Pharmacy will continue to follow.  Joen Laura 01/25/2015,2:57 PM

## 2015-01-25 NOTE — Care Management Note (Signed)
Case Management Note  Patient Details  Name: Quida Glasser MRN: [id number] Date of Birth: 1956/02/07  Subjective/Objective:                  This RNCM spoke with patient by phone because she is on airbourne isolation to rule out TB. She states she has been independent with daily needs but short of breathe currently. She struggled to complete sentences while we talked. She states that she has used home O2 in the past through [place] but is not on it currently at home. Her PCP is with Dr. Humphrey Rolls Let[facility]). She states she can afford her Rx.   Action/Plan: RNCM to follow for home O2 needs.   Expected Discharge Date:                  Expected Discharge Plan:     In-House Referral:  PCP / [facility]  Discharge planning Services     Post Acute Care Choice:    Choice offered to:     DME Arranged:    DME Agency:     HH Arranged:    [place] Agency:     Status of Service:  In process, will continue to follow  Medicare Important Message Given:  Yes Date Medicare IM Given:  01/24/15 Medicare IM give by:  Hester Mates, RN Date Additional Medicare IM Given:    Additional Medicare Important Message give by:     If discussed at [place] of Stay Meetings, dates discussed:    Additional Comments:  Marshell Garfinkel, RN 01/25/2015, 1:44 PM

## 2015-01-26 LAB — MISC LABCORP TEST (SEND OUT)

## 2015-01-26 LAB — VANCOMYCIN, TROUGH: VANCOMYCIN TR: 7 ug/mL — AB (ref 10–20)

## 2015-01-26 MED ORDER — VANCOMYCIN HCL 10 G IV SOLR
1250.0000 mg | Freq: Two times a day (BID) | INTRAVENOUS | Status: DC
  Administered 2015-01-26 – 2015-01-27 (×4): 1250 mg via INTRAVENOUS
  Filled 2015-01-26 (×7): qty 1250

## 2015-01-26 MED ORDER — ASPIRIN EC 325 MG PO TBEC
325.0000 mg | DELAYED_RELEASE_TABLET | Freq: Every day | ORAL | Status: DC
  Administered 2015-01-27 – 2015-01-31 (×5): 325 mg via ORAL
  Filled 2015-01-26 (×5): qty 1

## 2015-01-26 MED ORDER — ENOXAPARIN SODIUM 40 MG/0.4ML ~~LOC~~ SOLN
40.0000 mg | SUBCUTANEOUS | Status: DC
  Administered 2015-01-27 – 2015-02-02 (×7): 40 mg via SUBCUTANEOUS
  Filled 2015-01-26 (×7): qty 0.4

## 2015-01-26 NOTE — Progress Notes (Addendum)
ANTIBIOTIC CONSULT NOTE - INITIAL  Pharmacy Consult for Vancomycin Indication: pneumonia  Allergies  Allergen Reactions  . Aspirin Nausea And Vomiting and Other (See Comments)    Pt states aspirin makes her cramp and have to use coated kind.    Patient Measurements: Height: 5\' 4"  (162.6 cm) Weight: 126 lb 4 oz (57.267 kg) IBW/kg (Calculated) : 54.7   Vital Signs: Temp: 102 F (38.9 C) (05/25 0537) Temp Source: Oral (05/25 0537) BP: 110/51 mmHg (05/25 0537) Pulse Rate: 84 (05/25 0537)  Labs:  Recent Labs  01/24/15 0616  WBC 8.5  HGB 9.0*  PLT 222  CREATININE 0.98   Estimated Creatinine Clearance: 53.4 mL/min (by C-G formula based on Cr of 0.98).  Recent Labs  01/26/15 0456  [place] 7*     Microbiology: Recent Results (from the past 720 hour(s))  Culture, blood (routine x 2)     Status: None (Preliminary result)   Collection Time: 01/22/15  9:02 AM  Result Value Ref Range Status   Specimen Description BLOOD  Final   Special Requests NONE  Final   Culture NO GROWTH 3 DAYS  Final   Report Status PENDING  Incomplete  Culture, blood (routine x 2)     Status: None (Preliminary result)   Collection Time: 01/22/15  9:02 AM  Result Value Ref Range Status   Specimen Description BLOOD  Final   Special Requests NONE  Final   Culture NO GROWTH 3 DAYS  Final   Report Status PENDING  Incomplete  Culture, sputum-assessment     Status: None   Collection Time: 01/25/15  2:47 AM  Result Value Ref Range Status   Specimen Description SPU  Final   Special Requests NONE  Final   Sputum evaluation   Final    THIS SPECIMEN IS ACCEPTABLE FOR SPUTUM CULTURE CTJ 01/25/15    Report Status 01/25/2015 FINAL  Final    Medical History: Past Medical History  Diagnosis Date  . 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negative cardiac cath  . COPD (chronic obstructive pulmonary disease)   . Hypertension   . Peripheral vascular disease   . Nicotine addiction   . Asthma     Medications:   Anti-infectives    Start     Dose/Rate Route Frequency Ordered Stop   01/26/15 0630  vancomycin (VANCOCIN) 1,250 mg in sodium chloride 0.9 % 250 mL IVPB     1,250 mg 166.7 mL/hr over 90 Minutes Intravenous Every 12 hours 01/26/15 0603     01/22/15 1600  piperacillin-tazobactam (ZOSYN) IVPB 3.375 g     3.375 g 12.5 mL/hr over 240 Minutes Intravenous 3 times per day 01/22/15 1352     01/22/15 1430  azithromycin (ZITHROMAX) 500 mg in dextrose 5 % 250 mL IVPB     500 mg 250 mL/hr over 60 Minutes Intravenous  Once 01/22/15 1351 01/22/15 1606   01/22/15 1400  piperacillin-tazobactam (ZOSYN) IVPB 3.375 g  Status:  Discontinued     3.375 g 12.5 mL/hr over 240 Minutes Intravenous 3 times per day 01/22/15 0951 01/22/15 1352   01/22/15 1200  vancomycin (VANCOCIN) IVPB 1000 mg/200 mL premix  Status:  Discontinued     1,000 mg 200 mL/hr over 60 Minutes Intravenous Every 18 hours 01/22/15 1124 01/26/15 0603   01/22/15 1000  vancomycin (VANCOCIN) IVPB 1000 mg/200 mL premix  Status:  Discontinued     1,000 mg 200 mL/hr over 60 Minutes Intravenous Every 24 hours 01/22/15 0951 01/22/15 1124  01/22/15 0845  cefTRIAXone (ROCEPHIN) 1 g in dextrose 5 % 50 mL IVPB     1 g 100 mL/hr over 30 Minutes Intravenous  Once 01/22/15 0843 01/22/15 0931   01/22/15 0845  azithromycin (ZITHROMAX) 500 mg in dextrose 5 % 250 mL IVPB  Status:  Discontinued     500 mg 250 mL/hr over 60 Minutes Intravenous  Once 01/22/15 0843 01/22/15 1351     Assessment: 59 YO female currently ordered vancomycin and zosyn for treatment of pneumonia. 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  Ke 0.046 h^-1    T1/2 15.07 h    Vd 43.2 L 5/25 Vanc trough: 7  Goal of Therapy:  Vancomycin trough level 15-20 mcg/ml  Plan:  Expected duration 7 days with resolution of temperature and/or normalization of WBC  Vanc trough: 7 Vancomycin changed from 1000mg  IV Q18hrs to 1250mg  IV  Q12hrs. Next trough due 5/26 at 1800hrs.   5/26 :  Vanc trough @ 18:00 = 53 mcg/mL .  Spoke with lab , we believe this result to be in error (vanc trough was probably drawn as vancomycin was infusing). Will repeat Vanc trough on 5/27 @ 6:00  Pharmacy will continue to follow.  Carwile,Matthew K 01/26/2015,6:04 AM

## 2015-01-26 NOTE — Progress Notes (Signed)
[place] at [facility]                                                                                                                                                                                            Patient Demographics   [name], is a 59 y.o. female, DOB - 1956/01/28, SEG:[id number]  Admit date - 01/22/2015   Admitting Physician Dustin Flock, MD  Outpatient Primary MD for the patient is No primary care provider on file.  LOS - 4  Chief Complaint  Patient presents with  . Pleurisy   Cultures pending from sputum. Patient denies any significant shortness of breath does complain of chest pain with deep breathing   Review of Systems:   CONSTITUTIONAL: No documented fever. No fatigue, weakness. No weight gain, no weight loss.  EYES: No blurry or double vision.  ENT: No tinnitus. No postnasal drip. No redness of the oropharynx.  RESPIRATORY: Dry cough, no wheeze, no hemoptysis. Mild dyspnea.  CARDIOVASCULAR: Right-sided chest pain. No orthopnea. No palpitations. No syncope.  GASTROINTESTINAL: No nausea, no vomiting or diarrhea. No abdominal pain. No melena or hematochezia.  GENITOURINARY: No dysuria or hematuria.  ENDOCRINE: No polyuria or nocturia. No heat or cold intolerance.  HEMATOLOGY: No anemia. No bruising. No bleeding.  INTEGUMENTARY: No rashes. No lesions.  MUSCULOSKELETAL: No arthritis. No swelling. No gout.  NEUROLOGIC: No numbness, tingling, or ataxia. No seizure-type activity.  PSYCHIATRIC: No anxiety. No insomnia. No ADD.    Vitals:   Filed Vitals:   01/25/15 1421 01/25/15 2055 01/26/15 0537 01/26/15 0655  BP: 120/58 120/66 110/51   Pulse: 71 87 84   Temp: 98.3 F (36.8 C) 99.3 F (37.4 C) 102 F (38.9 C) 99.5 F (37.5 C)  TempSrc: Oral Oral Oral Oral  Resp: 18     Height:      Weight:      SpO2: 97% 98% 95%     Wt Readings from Last 3 Encounters:  01/22/15 57.267 kg (126 lb 4 oz)      Intake/Output Summary (Last 24 hours) at 01/26/15 1241 Last data filed at 01/26/15 0804  Gross per 24 hour  Intake    850 ml  Output   2000 ml  Net  -1150 ml    Physical Exam:   GENERAL: Pleasant-appearing in no apparent distress.  HEAD, EYES, EARS, NOSE AND THROAT: Atraumatic, normocephalic. Extraocular muscles are intact. Pupils equal and reactive to light. Sclerae anicteric. No conjunctival injection. No oro-pharyngeal erythema.  NECK: Supple. There is no jugular venous distention. No bruits, no lymphadenopathy, no thyromegaly.  HEART: Regular rate  and rhythm, tachycardic. No murmurs, no rubs, no clicks.  LUNGS: Occasional rhonchi. No rales or  No wheezes.  ABDOMEN: Soft, flat, nontender, nondistended. Has good bowel sounds. No hepatosplenomegaly appreciated.  EXTREMITIES: No evidence of any cyanosis, clubbing, or peripheral edema.  +2 pedal and radial pulses bilaterally.  NEUROLOGIC: The patient is alert, awake, and oriented x3 with no focal motor or sensory deficits appreciated bilaterally.  SKIN: Moist and warm with no rashes appreciated.  Psych: Not anxious, depressed LN: No inguinal LN enlargement    Antibiotics   Anti-infectives    Start     Dose/Rate Route Frequency Ordered Stop   01/26/15 0630  vancomycin (VANCOCIN) 1,250 mg in sodium chloride 0.9 % 250 mL IVPB     1,250 mg 166.7 mL/hr over 90 Minutes Intravenous Every 12 hours 01/26/15 0603     01/22/15 1600  piperacillin-tazobactam (ZOSYN) IVPB 3.375 g     3.375 g 12.5 mL/hr over 240 Minutes Intravenous 3 times per day 01/22/15 1352     01/22/15 1430  azithromycin (ZITHROMAX) 500 mg in dextrose 5 % 250 mL IVPB     500 mg 250 mL/hr over 60 Minutes Intravenous  Once 01/22/15 1351 01/22/15 1606   01/22/15 1400  piperacillin-tazobactam (ZOSYN) IVPB 3.375 g  Status:  Discontinued     3.375 g 12.5 mL/hr over 240 Minutes Intravenous 3 times per day 01/22/15 0951 01/22/15 1352   01/22/15 1200  vancomycin (VANCOCIN)  IVPB 1000 mg/200 mL premix  Status:  Discontinued     1,000 mg 200 mL/hr over 60 Minutes Intravenous Every 18 hours 01/22/15 1124 01/26/15 0603   01/22/15 1000  vancomycin (VANCOCIN) IVPB 1000 mg/200 mL premix  Status:  Discontinued     1,000 mg 200 mL/hr over 60 Minutes Intravenous Every 24 hours 01/22/15 0951 01/22/15 1124   01/22/15 0845  cefTRIAXone (ROCEPHIN) 1 g in dextrose 5 % 50 mL IVPB     1 g 100 mL/hr over 30 Minutes Intravenous  Once 01/22/15 0843 01/22/15 0931   01/22/15 0845  azithromycin (ZITHROMAX) 500 mg in dextrose 5 % 250 mL IVPB  Status:  Discontinued     500 mg 250 mL/hr over 60 Minutes Intravenous  Once 01/22/15 0843 01/22/15 1351      Medications   Scheduled Meds: . aspirin EC  325 mg Oral Daily  . atorvastatin  80 mg Oral BH-[id number]  . enoxaparin (LOVENOX) injection  40 mg Subcutaneous Q24H  . ferrous sulfate  325 mg Oral Daily  . guaiFENesin  600 mg Oral BID  . losartan  50 mg Oral Daily  . metoprolol  100 mg Oral Daily  . piperacillin-tazobactam (ZOSYN)  IV  3.375 g Intravenous 3 times per day  . polyethylene glycol  17 g Oral Daily  . vancomycin  1,250 mg Intravenous Q12H   Continuous Infusions: . sodium chloride 75 mL/hr at 01/26/15 0705   PRN Meds:.acetaminophen **OR** acetaminophen, guaiFENesin-codeine, HYDROcodone-acetaminophen, lactulose, morphine injection, nicotine polacrilex, ondansetron **OR** ondansetron (ZOFRAN) IV, senna-docusate, zolpidem   Data Review:   Micro Results Recent Results (from the past 240 hour(s))  Culture, blood (routine x 2)     Status: None (Preliminary result)   Collection Time: 01/22/15  9:02 AM  Result Value Ref Range Status   Specimen Description BLOOD  Final   Special Requests NONE  Final   Culture NO GROWTH 3 DAYS  Final   Report Status PENDING  Incomplete  Culture, blood (routine x 2)  Status: None (Preliminary result)   Collection Time: 01/22/15  9:02 AM  Result Value Ref Range Status   Specimen  Description BLOOD  Final   Special Requests NONE  Final   Culture NO GROWTH 3 DAYS  Final   Report Status PENDING  Incomplete  Culture, sputum-assessment     Status: None   Collection Time: 01/25/15  2:47 AM  Result Value Ref Range Status   Specimen Description SPU  Final   Special Requests NONE  Final   Sputum evaluation   Final    THIS SPECIMEN IS ACCEPTABLE FOR SPUTUM CULTURE CTJ 01/25/15    Report Status 01/25/2015 FINAL  Final  Culture, respiratory (NON-Expectorated)     Status: None (Preliminary result)   Collection Time: 01/25/15  2:47 AM  Result Value Ref Range Status   Specimen Description SPU  Final   Special Requests NONE Reflexed from [id number]  Final   Gram Stain PENDING  Incomplete   Culture HOLDING FOR POSSIBLE PATHOGEN  Final   Report Status PENDING  Incomplete    Radiology Reports Dg Chest 2 View  01/22/2015   CLINICAL DATA:  Pleurisy. Right-sided pain, back pain for 1 week. History of COPD.  EXAM: CHEST  2 VIEW  COMPARISON:  10/15/2013  FINDINGS: Within the posterior right upper lobe is a a 4.7 cm rounded masslike opacity, with surrounding ill-defined density. Additional ill-defined density in the right middle lobe. Underlying emphysema again seen. Probable atelectasis or scarring at the left costophrenic angle. The heart size and mediastinal contours are normal, mild atherosclerosis of the thoracic aorta. No pleural effusion. No pneumothorax. There is degenerative change in the spine.  IMPRESSION: Rounded 4.7 since masslike opacity in the right upper lobe, with surrounding ill-defined density. This may reflect pneumonia versus neoplasm. There is additional opacity in the right middle lobe concerning for pneumonia.  Chest CT correlation versus radiographic follow-up recommended. While traditional follow-up for pneumonia would be in 3-4 weeks after course of antibiotics, given the masslike appearance, shorter interval follow-up is recommended in 1-2 weeks.   Electronically  Signed   By: Jeb Levering M.D.   On: 01/22/2015 06:42   Ct Chest Wo Contrast  01/22/2015   CLINICAL DATA:  Five day history of right-sided chest pain, worsening over the past 2 days. Right upper lobe lung mass on earlier chest x-ray.  EXAM: CT CHEST WITHOUT CONTRAST  TECHNIQUE: Multidetector CT imaging of the chest was performed following the standard protocol without IV contrast.  COMPARISON:  CT chest 10/14/2012. Two-view chest x-ray earlier same date.  FINDINGS: Lack of intravenous contrast makes the examination less sensitive than it would be otherwise. Severe emphysematous changes throughout both lungs with dense masslike opacity in the right upper lobe. Gas bubbles within the opacity. Extensive bullous changes are present in the right upper lobe in the area of the opacity. Patchy airspace opacities are present more inferiorly in the right lower lobe. Minimal patchy airspace opacities anteriorly in the right middle lobe. Masslike pleural-based opacity in the inferior left lower lobe measuring approximately 1.8 x 2.1 x 2.0 cm.  Multiple bilateral lung nodules, many of which are new since the prior CT. The largest nodule is in the inferior left upper lobe and measures approximately 5 mm (series 3, image 30).  No associated pleural effusions.  No pleural masses.  Normal heart size with extensive 3 vessel coronary atherosclerosis. Severe atherosclerosis involving the thoracic and upper abdominal aorta and their visualized branches without evidence of aneurysm. No  visible mediastinal, hilar or axillary lymphadenopathy. Visualized thyroid gland unremarkable.  Visualized upper abdomen unremarkable. 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  IMPRESSION: 1. 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 2. Approximate 2 cm mass in the inferior left lower lobe. 3.  Numerous bilateral pulmonary nodules, the largest approximating 5 mm in the left lower lobe. 4. Severe COPD/emphysema. 5. No visible mediastinal, hilar or axillary lymphadenopathy, allowing for the unenhanced technique.   Electronically Signed   By: Evangeline Dakin M.D.   On: 01/22/2015 08:29   Ct Chest W Contrast  01/22/2015   CLINICAL DATA:  Right upper lobe lung mass with productive cough and chest pain.  EXAM: CT CHEST, ABDOMEN, AND PELVIS WITH CONTRAST  TECHNIQUE: Multidetector CT imaging of the chest, abdomen and pelvis was performed following the standard protocol during bolus administration of intravenous contrast.  CONTRAST:  19mL OMNIPAQUE IOHEXOL 300 MG/ML  SOLN  COMPARISON:  Unenhanced CT of the chest earlier today at 737 hours and prior CT on 10/14/2012.  FINDINGS: CT CHEST FINDINGS  Geographic area of peripheral consolidation in the lateral and posterior right upper lobe spans over a region measuring roughly 3.7 x 7.3 x 4.4 cm. Areas of gas formation are consistent with necrosis and there is a small posterior component of focal fluid attenuation measuring roughly 1.5 cm in diameter. Based on the contrast-enhanced CT, this may represent a necrotic pneumonia and is not necessarily representative of malignancy. The small area of liquefaction may represent early focal abscess formation. Additional ground-glass and alveolar opacity in the anterior right upper lobe and right middle lobe likely represent acute infection and extension of infiltrate. Clinical and imaging follow-up is recommended. If the process does not resolve, or worsens on antibiotic therapy, Pulmonary Medicine evaluation is recommended.  The area of mass-like opacity measured in the anterior aspect of the lateral left lower lobe is again visualized. This is also somewhat geographic in appearance and may not represent a focal mass. Focal atelectasis and scarring is possible. Malignancy is not completely excluded. A 6-7 mm anterior right  upper lobe nodule is stable since 2014. A 5 mm anterior left upper lobe nodule is stable since 2014. A 5 mm inferior lingular nodule is stable since 2014. 4 mm subpleural nodule in the right middle lobe is stable since 2014. There are 2 tiny subpleural nodules in the posterior right lower lobe. One of these located more medially was not as apparent on the 2014 study. Tiny lateral subpleural nodules in the right lower lobe appears stable since the prior study in 2014.  No airway obstruction. Mildly prominent right hilar lymph node measures approximately 11 mm in short axis. There are a few tiny additional mediastinal nodes with no enlarged lymph nodes seen. No pleural or pericardial fluid is seen. The heart size is normal. Mild spondylosis present of the thoracic spine.  CT ABDOMEN AND PELVIS FINDINGS  Solid organs show no evidence of metastatic disease. The liver, pancreas, spleen, adrenal glands and kidneys are normal. There is a small hiatal hernia. The gallbladder has been removed. No enlarged lymph nodes are seen.  Bowel is unremarkable and shows no evidence of obstruction or lesion. No free fluid, free air or abscess is identified. Small right inguinal hernia contains fat. The bladder is unremarkable. The uterus has been removed. The abdominal aorta and iliac arteries show atherosclerotic calcifications without aneurysmal disease or significant  obstructive disease identified. No bony lesions are identified.  IMPRESSION: 1. The geographic area of peripheral consolidation in the right upper lobe has an appearance most likely consistent with necrotic pneumonia with additional small early abscess formation measuring 1.5 cm within this region. Although malignancy cannot be excluded, necrotic pneumonia is favored. Additional alveolar and ground-glass infiltrate extends into the anterior right upper lobe and right middle lobe. If the process does not resolve or worsens, pulmonary Medicine evaluation is recommended.  Associated 11 mm right hilar lymph node may be reactive or metastatic. 2. The peripheral lateral left lower lobe opacity may represent focal atelectasis and scarring. Malignancy cannot be excluded, however. Once the pneumonia of the right upper lobe has been adequately treated, if there is persistent opacity in the left lower lobe, PET scan evaluation may be helpful. 3. All of the subcentimeter small nodules identified bilaterally are largely peripheral/subpleural and are stable since the CT in 2014. One of the right lower lobe nodules may be new/more prominent but only measures a few mm in diameter. All of these tiny nodules are most likely postinflammatory and not representative of metastatic nodules. 4. No evidence of pathology in the abdomen or pelvis. No acute findings or evidence of metastatic disease. Incidental small hiatal hernia and small right inguinal hernia containing fat.   Electronically Signed   By: Aletta Edouard M.D.   On: 01/22/2015 13:45   Ct Abdomen Pelvis W Contrast  01/22/2015   CLINICAL DATA:  Right upper lobe lung mass with productive cough and chest pain.  EXAM: CT CHEST, ABDOMEN, AND PELVIS WITH CONTRAST  TECHNIQUE: Multidetector CT imaging of the chest, abdomen and pelvis was performed following the standard protocol during bolus administration of intravenous contrast.  CONTRAST:  137mL OMNIPAQUE IOHEXOL 300 MG/ML  SOLN  COMPARISON:  Unenhanced CT of the chest earlier today at 737 hours and prior CT on 10/14/2012.  FINDINGS: CT CHEST FINDINGS  Geographic area of peripheral consolidation in the lateral and posterior right upper lobe spans over a region measuring roughly 3.7 x 7.3 x 4.4 cm. Areas of gas formation are consistent with necrosis and there is a small posterior component of focal fluid attenuation measuring roughly 1.5 cm in diameter. Based on the contrast-enhanced CT, this may represent a necrotic pneumonia and is not necessarily representative of malignancy. The small area  of liquefaction may represent early focal abscess formation. Additional ground-glass and alveolar opacity in the anterior right upper lobe and right middle lobe likely represent acute infection and extension of infiltrate. Clinical and imaging follow-up is recommended. If the process does not resolve, or worsens on antibiotic therapy, Pulmonary Medicine evaluation is recommended.  The area of mass-like opacity measured in the anterior aspect of the lateral left lower lobe is again visualized. This is also somewhat geographic in appearance and may not represent a focal mass. Focal atelectasis and scarring is possible. Malignancy is not completely excluded. A 6-7 mm anterior right upper lobe nodule is stable since 2014. A 5 mm anterior left upper lobe nodule is stable since 2014. A 5 mm inferior lingular nodule is stable since 2014. 4 mm subpleural nodule in the right middle lobe is stable since 2014. There are 2 tiny subpleural nodules in the posterior right lower lobe. One of these located more medially was not as apparent on the 2014 study. Tiny lateral subpleural nodules in the right lower lobe appears stable since the prior study in 2014.  No airway obstruction. Mildly prominent right hilar lymph  node measures approximately 11 mm in short axis. There are a few tiny additional mediastinal nodes with no enlarged lymph nodes seen. No pleural or pericardial fluid is seen. The heart size is normal. Mild spondylosis present of the thoracic spine.  CT ABDOMEN AND PELVIS FINDINGS  Solid organs show no evidence of metastatic disease. The liver, pancreas, spleen, adrenal glands and kidneys are normal. There is a small hiatal hernia. The gallbladder has been removed. No enlarged lymph nodes are seen.  Bowel is unremarkable and shows no evidence of obstruction or lesion. No free fluid, free air or abscess is identified. Small right inguinal hernia contains fat. The bladder is unremarkable. The uterus has been removed. The  abdominal aorta and iliac arteries show atherosclerotic calcifications without aneurysmal disease or significant obstructive disease identified. No bony lesions are identified.  IMPRESSION: 1. The geographic area of peripheral consolidation in the right upper lobe has an appearance most likely consistent with necrotic pneumonia with additional small early abscess formation measuring 1.5 cm within this region. Although malignancy cannot be excluded, necrotic pneumonia is favored. Additional alveolar and ground-glass infiltrate extends into the anterior right upper lobe and right middle lobe. If the process does not resolve or worsens, pulmonary Medicine evaluation is recommended. Associated 11 mm right hilar lymph node may be reactive or metastatic. 2. The peripheral lateral left lower lobe opacity may represent focal atelectasis and scarring. Malignancy cannot be excluded, however. Once the pneumonia of the right upper lobe has been adequately treated, if there is persistent opacity in the left lower lobe, PET scan evaluation may be helpful. 3. All of the subcentimeter small nodules identified bilaterally are largely peripheral/subpleural and are stable since the CT in 2014. One of the right lower lobe nodules may be new/more prominent but only measures a few mm in diameter. All of these tiny nodules are most likely postinflammatory and not representative of metastatic nodules. 4. No evidence of pathology in the abdomen or pelvis. No acute findings or evidence of metastatic disease. Incidental small hiatal hernia and small right inguinal hernia containing fat.   Electronically Signed   By: Aletta Edouard M.D.   On: 01/22/2015 13:45     CBC  Recent Labs Lab 01/22/15 0604 01/23/15 0424 01/24/15 0616  WBC 12.2* 12.0* 8.5  HGB 11.3* 10.3* 9.0*  HCT 34.0* 31.7* 27.4*  PLT 272 228 222  MCV 94.5 94.7 95.3  MCH 31.3 30.8 31.4  MCHC 33.1 32.5 32.9  RDW 15.1* 14.8* 15.2*    Chemistries   Recent  Labs Lab 01/22/15 0604 01/23/15 0424 01/24/15 0616  NA 134* 134* 137  K 3.7 3.9 3.5  CL 100* 101 105  CO2 23 24 24   GLUCOSE 106* 100* 99  BUN 15 16 9   CREATININE 1.05* 1.11* 0.98  CALCIUM 9.2 8.4* 8.3*   ------------------------------------------------------------------------------------------------------------------ estimated creatinine clearance is 53.4 mL/min (by C-G formula based on Cr of 0.98). ------------------------------------------------------------------------------------------------------------------ No results for input(s): HGBA1C in the last 72 hours. ------------------------------------------------------------------------------------------------------------------ No results for input(s): CHOL, HDL, LDLCALC, TRIG, CHOLHDL, LDLDIRECT in the last 72 hours. ------------------------------------------------------------------------------------------------------------------ No results for input(s): TSH, T4TOTAL, T3FREE, THYROIDAB in the last 72 hours.  Invalid input(s): FREET3 ------------------------------------------------------------------------------------------------------------------ No results for input(s): VITAMINB12, FOLATE, FERRITIN, TIBC, IRON, RETICCTPCT in the last 72 hours.  Coagulation profile No results for input(s): INR, PROTIME in the last 168 hours.  No results for input(s): DDIMER in the last 72 hours.  Cardiac Enzymes  Recent Labs Lab 01/22/15 0604  TROPONINI <0.03   ------------------------------------------------------------------------------------------------------------------  Invalid input(s): POCBNP    Assessment & Plan   Active Problems:   Necrotizing pneumonia IMPRESSION AND PLAN: Patient is a 59 year old African-American female with history of nicotine addiction, COPD presents with right-sided chest pain noted to have a mass as well as possible pneumonia.  #1. Right-sided pleuritic chest pain: Continue anabiotic's until sputum  cultures are available. PPD is negative, sputum for AFB is currently pending  #2. COPD; there is no evidence of acute exacerbation continue her on Advair and Spiriva. And Combivent inhalers as needed.  #3. Peripheral vascular disease;   aspirin  #4. Nicotine addiction smoking cessation done     Code Status Orders        Start     Ordered   01/22/15 0947  Full code   Continuous     01/22/15 0948      Family Communication: Patient   Disposition Plan: Home   Procedures none   Consults pulmonary consult pending   DVT Prophylaxis  SCDs  Lab Results  Component Value Date   PLT 222 01/24/2015     Time Spent in minutes 25 minutes   Dustin Flock M.D on 01/26/2015 at 12:41 PM  Between 7am to 6pm - Pager - [phone]  After 6pm go to [email or web address] - password [email or web address] [facility]   Office  [phone]

## 2015-01-26 NOTE — Plan of Care (Signed)
Problem: Discharge Progression Outcomes Goal: Discharge plan in place and appropriate Individualization  Outcome: Progressing Patient prefers to be called [name], from home.   Current smoker.   PMH: COPD, HTN, MI, PVD - controlled with home medications.   Moderate Fall Risk.   Goal: Other Discharge Outcomes/Goals Outcome: Progressing Patient is alert and oriented, independent in room. C/o of right chest pain. Norco and cough syrup given with some relief. Ambien given for sleep. Patient resting quietly.

## 2015-01-26 NOTE — Progress Notes (Signed)
Date of Admission:  01/22/2015   ID: [name] is a 59 y.o. female with  Cavitary pna  Active Problems:   Necrotizing pneumonia  Subjective: Febrile to 102. Still with some cough.   Medications:  . aspirin EC  325 mg Oral Daily  . atorvastatin  80 mg Oral BH-q7a  . enoxaparin (LOVENOX) injection  40 mg Subcutaneous Q24H  . ferrous sulfate  325 mg Oral Daily  . guaiFENesin  600 mg Oral BID  . losartan  50 mg Oral Daily  . metoprolol  100 mg Oral Daily  . piperacillin-tazobactam (ZOSYN)  IV  3.375 g Intravenous 3 times per day  . polyethylene glycol  17 g Oral Daily  . vancomycin  1,250 mg Intravenous Q12H    Objective: Vital signs in last 24 hours: Temp:  [99.3 F (37.4 C)-102 F (38.9 C)] 99.6 F (37.6 C) (05/25 1403) Pulse Rate:  [73-87] 73 (05/25 1403) Resp:  [20] 20 (05/25 1403) BP: (110-144)/(51-74) 144/74 mmHg (05/25 1403) SpO2:  [95 %-100 %] 100 % (05/25 1403) GENERAL: lying in the bed with no acute distress, but diaphoretic EYES: Pupils equal, round, reactive to light and accommodation. No scleral icterus. Extraocular muscles intact.  HEENT: Head atraumatic, normocephalic. Oropharynx and nasopharynx clear.  NECK: Supple, no jugular venous distention. No thyroid enlargement, no tenderness.  LUNGS: Diminished breath sounds bilaterally, no wheezing, rales, positive rhonchi no crepitation. No use of accessory muscles of respiration.  CARDIOVASCULAR: S1, S2 normal. No murmurs, rubs, or gallops.  ABDOMEN: Soft, nontender, nondistended. Bowel sounds present. No organomegaly or mass.  EXTREMITIES: No pedal edema, cyanosis, or clubbing.  NEUROLOGIC: Cranial nerves II through XII are intact. Muscle strength 5/5 in all extremities. Sensation intact. Gait not checked.  PSYCHIATRIC: The patient is alert and oriented x 3.  SKIN: No obvious rash, lesion, or ulcer  Lab Results  Recent Labs  01/24/15 0616  WBC 8.5  HGB 9.0*  HCT 27.4*  NA 137  K 3.5  CL  105  CO2 24  BUN 9  CREATININE 0.98   Microbiology: Results for orders placed or performed during the hospital encounter of 01/22/15  Culture, blood (routine x 2)     Status: None (Preliminary result)   Collection Time: 01/22/15  9:02 AM  Result Value Ref Range Status   Specimen Description BLOOD  Final   Special Requests NONE  Final   Culture NO GROWTH 4 DAYS  Final   Report Status PENDING  Incomplete  Culture, blood (routine x 2)     Status: None (Preliminary result)   Collection Time: 01/22/15  9:02 AM  Result Value Ref Range Status   Specimen Description BLOOD  Final   Special Requests NONE  Final   Culture NO GROWTH 4 DAYS  Final   Report Status PENDING  Incomplete  Culture, sputum-assessment     Status: None   Collection Time: 01/25/15  2:47 AM  Result Value Ref Range Status   Specimen Description SPU  Final   Special Requests NONE  Final   Sputum evaluation   Final    THIS SPECIMEN IS ACCEPTABLE FOR SPUTUM CULTURE CTJ 01/25/15    Report Status 01/25/2015 FINAL  Final  Culture, respiratory (NON-Expectorated)     Status: None (Preliminary result)   Collection Time: 01/25/15  2:47 AM  Result Value Ref Range Status   Specimen Description SPU  Final   Special Requests NONE Reflexed from [id number]  Final   Gram Stain PENDING  Incomplete  Culture HOLDING FOR POSSIBLE PATHOGEN  Final   Report Status PENDING  Incomplete    Studies/Results:  No results found.  Assessment/Plan: [name] is a 59 y.o. female hx of COPD, HTN PVD ongoing tobacco abuse admitted 5/21 with 2 weeks cough and pleurtiic chest pain. Found to have R sided infiltrate vs mass. On further questioning reports 3 months of slowly progressive pleurtic pain. PPD is negative. HIV negative. QFG pending.  CT shows impressive infiltrate, likely necrotizing PNA. No real TB risk factors but could certainly be TB.   Rec AFB are pending- seems that 3 are in process I have sent extra sputum for Xpert PCR  testing as well Cont zosyn and vanco pending sputum results (seems to be pending culture being held for possible pathogen)  [place], [name]   01/26/2015, 3:08 PM

## 2015-01-26 NOTE — Progress Notes (Signed)
Pulmonary Critical Care  Follow Up Consult Note   [name] UVO:[id number] DOB: 1955/12/04 DOA: 01/22/2015  Referring physician: Dustin Flock, MD PCP: No primary care provider on file.    Subjective: Doing a little better. Sputum cultures are pending  Review of Systems:  Constitutional:  No Fevers, chills, fatigue.  HEENT:  No headaches, nasal congestion, post nasal drip,  Cardio-vascular:  No chest pain, Orthopnea, PND, swelling in lower extremities, anasarca, dizziness, palpitations  GI:  No heartburn, indigestion, abdominal pain, nausea, vomiting, diarrhea  Resp:  Some shortness of breath with exertion, No coughing up of blood.No wheezing  Remainder ROS performed and is unremarkable other than noted in HPI  Past Medical History  Diagnosis Date  . 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negative cardiac cath  . COPD (chronic obstructive pulmonary disease)   . Hypertension   . Peripheral vascular disease   . Nicotine addiction   . Asthma    Past Surgical History  Procedure Laterality Date  . Endovascular stent insertion Bilateral     Legs  . Cardiac surgery    . Hemorrhoid surgery     Social History:  reports that she has been smoking Cigarettes.  She has been smoking about 0.50 packs per day. She has never used smokeless tobacco. She reports that she drinks alcohol. She reports that she does not use illicit drugs.  Allergies  Allergen Reactions  . Aspirin Nausea And Vomiting and Other (See Comments)    Pt states aspirin makes her cramp and have to use coated kind.    Family History  Problem Relation Age of Onset  . Hypertension Mother   . Hypertension Father     Prior to Admission medications   Medication Sig Start Date End Date Taking? Authorizing Provider  aspirin EC 325 MG tablet Take 325 mg by mouth daily.   Yes Historical Provider, MD  atorvastatin (LIPITOR) 80 MG tablet Take 80 mg by mouth every morning.  11/15/14  Yes Historical Provider, MD  cetirizine  (ZYRTEC) 10 MG tablet Take 10 mg by mouth daily.   Yes Historical Provider, MD  ferrous sulfate 325 (65 FE) MG tablet Take 325 mg by mouth daily.   Yes Historical Provider, MD  losartan (COZAAR) 50 MG tablet Take 50 mg by mouth daily. 12/09/14  Yes Historical Provider, MD  metoprolol (LOPRESSOR) 50 MG tablet Take 100 mg by mouth daily. 01/19/15  Yes Historical Provider, MD   Physical Exam: Filed Vitals:   01/25/15 1421 01/25/15 2055 01/26/15 0537 01/26/15 0655  BP: 120/58 120/66 110/51   Pulse: 71 87 84   Temp: 98.3 F (36.8 C) 99.3 F (37.4 C) 102 F (38.9 C) 99.5 F (37.5 C)  TempSrc: Oral Oral Oral Oral  Resp: 18     Height:      Weight:      SpO2: 97% 98% 95%     Wt Readings from Last 3 Encounters:  01/22/15 57.267 kg (126 lb 4 oz)    General:  Appears calm and comfortable Eyes: PERRL, normal lids, irises & conjunctiva ENT: grossly normal hearing, lips & tongue Neck: no LAD, masses or thyromegaly Cardiovascular: RRR, no m/r/g. No LE edema. Respiratory: CTA bilaterally, no w/r/r Abdomen: soft, nontender Skin: no rash or induration seen on limited exam Musculoskeletal: grossly normal tone BUE/BLE Psychiatric: grossly normal mood and affect Neurologic: grossly non-focal.          Labs on Admission:  Basic Metabolic Panel:  Recent Labs Lab 01/22/15 0604  01/23/15 0424 01/24/15 0616  NA 134* 134* 137  K 3.7 3.9 3.5  CL 100* 101 105  CO2 23 24 24   GLUCOSE 106* 100* 99  BUN 15 16 9   CREATININE 1.05* 1.11* 0.98  CALCIUM 9.2 8.4* 8.3*   Liver Function Tests: No results for input(s): AST, ALT, ALKPHOS, BILITOT, PROT, ALBUMIN in the last 168 hours. No results for input(s): LIPASE, AMYLASE in the last 168 hours. No results for input(s): AMMONIA in the last 168 hours. CBC:  Recent Labs Lab 01/22/15 0604 01/23/15 0424 01/24/15 0616  WBC 12.2* 12.0* 8.5  HGB 11.3* 10.3* 9.0*  HCT 34.0* 31.7* 27.4*  MCV 94.5 94.7 95.3  PLT 272 228 222   Cardiac  Enzymes:  Recent Labs Lab 01/22/15 0604  TROPONINI <0.03    BNP (last 3 results) No results for input(s): BNP in the last 8760 hours.  ProBNP (last 3 results) No results for input(s): PROBNP in the last 8760 hours.  CBG: No results for input(s): GLUCAP in the last 168 hours.  Radiological Exams on Admission: No results found.   Assessment/Plan Active Problems:   Necrotizing pneumonia   1. Necrotizing Pneumonia -would continue with antibiotics -hold off on bronch or biopsy -will need follow up CT or CXR to show clearing of infiltrate and cavity    Time spent:45min    I have personally obtained a history, examined the patient, evaluated laboratory and imaging results, formulated the assessment and plan and placed orders.  The Patient requires high complexity decision making for assessment and support.    Allyne Gee, MD [facility] Pulmonary Critical Care Medicine Sleep Medicine

## 2015-01-26 NOTE — Plan of Care (Signed)
Problem: Discharge Progression Outcomes Goal: Discharge plan in place and appropriate Individualization  Individualization of Care:   Patient prefers to be called [name], from home.    Current smoker.    PMH: COPD, HTN, MI, PVD - controlled with home medications.    Moderate Fall Risk.        Goal: Other Discharge Outcomes/Goals Plan of care progress to goal:  Patient remains on Airborne precautions to rule out TB. NS at 75 ml/hr infusing. IV antibiotics continued. Up to BR independently. Patient weaned to 1L-O2, sat 98%. Norco x1 given for c/o right chest pain, relief noted.

## 2015-01-27 ENCOUNTER — Inpatient Hospital Stay: Payer: Medicare Other

## 2015-01-27 LAB — CULTURE, BLOOD (ROUTINE X 2)
CULTURE: NO GROWTH
Culture: NO GROWTH

## 2015-01-27 LAB — QUANTIFERON IN TUBE
QFT TB AG MINUS NIL VALUE: 0.13 IU/mL
QUANTIFERON MITOGEN VALUE: 0.5 IU/mL
QUANTIFERON NIL VALUE: 0.09 [IU]/mL
QUANTIFERON TB AG VALUE: 0.22 [IU]/mL
QUANTIFERON TB GOLD: UNDETERMINED

## 2015-01-27 LAB — CREATININE, SERUM
Creatinine, Ser: 0.79 mg/dL (ref 0.44–1.00)
GFR calc non Af Amer: >60 mL/min (ref >60)

## 2015-01-27 LAB — QUANTIFERON TB GOLD ASSAY (BLOOD)

## 2015-01-27 MED ORDER — AMOXICILLIN-POT CLAVULANATE 875-125 MG PO TABS
1.0000 | ORAL_TABLET | Freq: Two times a day (BID) | ORAL | Status: DC
  Administered 2015-01-27 – 2015-01-28 (×2): 1 via ORAL
  Filled 2015-01-27 (×2): qty 1

## 2015-01-27 MED ORDER — MENTHOL 3 MG MT LOZG
1.0000 | LOZENGE | OROMUCOSAL | Status: DC | PRN
  Administered 2015-01-27 – 2015-02-02 (×7): 3 mg via ORAL
  Filled 2015-01-27 (×3): qty 9

## 2015-01-27 NOTE — Plan of Care (Signed)
Problem: Discharge Progression Outcomes Goal: Discharge plan in place and appropriate Individualization  Patient prefers to be called [name], from home.     Current smoker.     Hx: COPD, HTN, MI, PVD - controlled with home medications.     Moderate Fall Risk. Pt shows understanding how to utilize call system for assistance.       Goal: Other Discharge Outcomes/Goals Plan of care progress to goals: 1. C/o pain r/t coughing - relieved by PRN pain medication and cough syrup  2. Hemodynamically             -vitals stable, elevated temp during the night, remains on 1L oxygen              -remains on airborne precautions to rule out TB             -IVF continued NS@75              -IV abx given as scheduled  3. Complications- none  4. Tolerating diet well  5. Activity: pt is independent out of bed No fall or injury this shift. Will continue to assess.

## 2015-01-27 NOTE — Progress Notes (Signed)
Dr Ola Spurr notified that 3 AFB smears were back, all negegative. uantiferon was indeterminate. Orders received to DC isolation orders

## 2015-01-27 NOTE — Progress Notes (Addendum)
Date of Admission:  01/22/2015   ID: [name] is a 59 y.o. female with  Cavitary pna  Active Problems:   Necrotizing pneumonia  Subjective: Low grade temp 100.7  Medications:  . aspirin EC  325 mg Oral Daily  . atorvastatin  80 mg Oral BH-q7a  . enoxaparin (LOVENOX) injection  40 mg Subcutaneous Q24H  . ferrous sulfate  325 mg Oral Daily  . guaiFENesin  600 mg Oral BID  . losartan  50 mg Oral Daily  . metoprolol  100 mg Oral Daily  . piperacillin-tazobactam (ZOSYN)  IV  3.375 g Intravenous 3 times per day  . polyethylene glycol  17 g Oral Daily  . vancomycin  1,250 mg Intravenous Q12H    Objective: Vital signs in last 24 hours: Temp:  [99.3 F (37.4 C)-100.8 F (38.2 C)] 99.3 F (37.4 C) (05/26 0432) Pulse Rate:  [73-79] 79 (05/26 0432) Resp:  [18-20] 18 (05/26 0432) BP: (139-144)/(74-82) 143/80 mmHg (05/26 0432) SpO2:  [98 %-100 %] 99 % (05/26 0432) GENERAL: lying in the bed with no acute distress, but diaphoretic EYES: Pupils equal, round, reactive to light and accommodation. No scleral icterus. Extraocular muscles intact.  HEENT: Head atraumatic, normocephalic. Oropharynx and nasopharynx clear.  NECK: Supple, no jugular venous distention. No thyroid enlargement, no tenderness.  LUNGS: Diminished breath sounds bilaterally, no wheezing, rales, positive rhonchi no crepitation. No use of accessory muscles of respiration.  CARDIOVASCULAR: S1, S2 normal. No murmurs, rubs, or gallops.  ABDOMEN: Soft, nontender, nondistended. Bowel sounds present. No organomegaly or mass.  EXTREMITIES: No pedal edema, cyanosis, or clubbing.  NEUROLOGIC: Cranial nerves II through XII are intact. Muscle strength 5/5 in all extremities. Sensation intact. Gait not checked.  PSYCHIATRIC: The patient is alert and oriented x 3.  SKIN: No obvious rash, lesion, or ulcer  Lab Results  Recent Labs  01/27/15 0915  CREATININE 0.79   Microbiology: Results for orders placed or  performed during the hospital encounter of 01/22/15  Culture, blood (routine x 2)     Status: None   Collection Time: 01/22/15  9:02 AM  Result Value Ref Range Status   Specimen Description BLOOD  Final   Special Requests NONE  Final   Culture NO GROWTH 5 DAYS  Final   Report Status 01/27/2015 FINAL  Final  Culture, blood (routine x 2)     Status: None   Collection Time: 01/22/15  9:02 AM  Result Value Ref Range Status   Specimen Description BLOOD  Final   Special Requests NONE  Final   Culture NO GROWTH 5 DAYS  Final   Report Status 01/27/2015 FINAL  Final  Culture, sputum-assessment     Status: None   Collection Time: 01/25/15  2:47 AM  Result Value Ref Range Status   Specimen Description SPU  Final   Special Requests NONE  Final   Sputum evaluation   Final    THIS SPECIMEN IS ACCEPTABLE FOR SPUTUM CULTURE CTJ 01/25/15    Report Status 01/25/2015 FINAL  Final  Culture, respiratory (NON-Expectorated)     Status: None (Preliminary result)   Collection Time: 01/25/15  2:47 AM  Result Value Ref Range Status   Specimen Description SPU  Final   Special Requests NONE Reflexed from [id number]  Final   Gram Stain PENDING  Incomplete   Culture APPEARS TO NORMAL FLORA PRESENT  Final   Report Status PENDING  Incomplete    Studies/Results:  Dg Chest 2 View  01/27/2015  CLINICAL DATA:  Shortness of breath and cough, followup  EXAM: CHEST  2 VIEW  COMPARISON:  CT chest of 01/22/2015 and chest x-ray of 01/22/2015  FINDINGS: There does appear to have been some interval worsening of the consolidation of a portion of the right upper lobe with areas of cavitation suggesting necrotic pneumonia. Vague opacity at the left costophrenic angle may represent scarring or patchy pneumonia as well. No definite effusion is seen. Heart size is normal. No bony abnormality is seen.  IMPRESSION: Perhaps slight worsening of right upper lobe opacity most consistent with necrotic pneumonia with areas of apparent  cavitation.   Electronically Signed   By: Ivar Drape M.D.   On: 01/27/2015 08:05    Assessment/Plan: [name] is a 59 y.o. female hx of COPD, HTN PVD ongoing tobacco abuse admitted 5/21 with 2 weeks cough and pleurtiic chest pain. Found to have R sided infiltrate vs mass. On further questioning reports 3 months of slowly progressive pleurtic pain. PPD is negative. HIV negative. QFG pending.  CT shows impressive infiltrate, likely necrotizing PNA. No real TB risk factors but could certainly be TB.   Rec AFB are negative x 3 (not yet in system yet but I confirmed with lab)  tb Xpert PCR neg as well Sputum culture is negative except for nml oral flora When stable for dc can transition to oral augmentin for 10 more days If worsens will need bronch With 3 neg AFB smears can come off isolation Will need fu CT imaging to document clearance in 3-4 weeks and fu with pulmonary  Wilkie Zenon   01/27/2015, 1:04 PM

## 2015-01-27 NOTE — Progress Notes (Signed)
[place] at [facility]                                                                                                                                                                                            Patient Demographics   [name], is a 59 y.o. female, DOB - 12/31/1955, NWG:[id number]  Admit date - 01/22/2015   Admitting Physician Dustin Flock, MD  Outpatient Primary MD for the patient is No primary care provider on file.  LOS - 5  Chief Complaint  Patient presents with  . Pleurisy   Bacterial culture from the sputum is showing no growth   Review of Systems:   CONSTITUTIONAL: No documented fever. No fatigue, weakness. No weight gain, no weight loss.  EYES: No blurry or double vision.  ENT: No tinnitus. No postnasal drip. No redness of the oropharynx.  RESPIRATORY: Dry cough, no wheeze, no hemoptysis. Mild dyspnea.  CARDIOVASCULAR: Right-sided chest pain. No orthopnea. No palpitations. No syncope.  GASTROINTESTINAL: No nausea, no vomiting or diarrhea. No abdominal pain. No melena or hematochezia.  GENITOURINARY: No dysuria or hematuria.  ENDOCRINE: No polyuria or nocturia. No heat or cold intolerance.  HEMATOLOGY: No anemia. No bruising. No bleeding.  INTEGUMENTARY: No rashes. No lesions.  MUSCULOSKELETAL: No arthritis. No swelling. No gout.  NEUROLOGIC: No numbness, tingling, or ataxia. No seizure-type activity.  PSYCHIATRIC: No anxiety. No insomnia. No ADD.    Vitals:   Filed Vitals:   01/26/15 1705 01/26/15 2128 01/26/15 2220 01/27/15 0432  BP:  139/82  143/80  Pulse:  74  79  Temp:  100.8 F (38.2 C) 100 F (37.8 C) 99.3 F (37.4 C)  TempSrc:  Oral Oral Oral  Resp:    18  Height:      Weight:      SpO2: 98% 99%  99%    Wt Readings from Last 3 Encounters:  01/22/15 57.267 kg (126 lb 4 oz)     Intake/Output Summary (Last 24 hours) at 01/27/15 1305 Last data filed at 01/26/15 1820  Gross per 24 hour   Intake 1142.5 ml  Output   1000 ml  Net  142.5 ml    Physical Exam:   GENERAL: Pleasant-appearing in no apparent distress.  HEAD, EYES, EARS, NOSE AND THROAT: Atraumatic, normocephalic. Extraocular muscles are intact. Pupils equal and reactive to light. Sclerae anicteric. No conjunctival injection. No oro-pharyngeal erythema.  NECK: Supple. There is no jugular venous distention. No bruits, no lymphadenopathy, no thyromegaly.  HEART: Regular rate and rhythm, tachycardic. No murmurs, no rubs, no clicks.  LUNGS: Occasional rhonchi. No rales or  No wheezes.  ABDOMEN: Soft, flat, nontender, nondistended. Has good bowel sounds. No hepatosplenomegaly appreciated.  EXTREMITIES: No evidence of any cyanosis, clubbing, or peripheral edema.  +2 pedal and radial pulses bilaterally.  NEUROLOGIC: The patient is alert, awake, and oriented x3 with no focal motor or sensory deficits appreciated bilaterally.  SKIN: Moist and warm with no rashes appreciated.  Psych: Not anxious, depressed LN: No inguinal LN enlargement    Antibiotics   Anti-infectives    Start     Dose/Rate Route Frequency Ordered Stop   01/26/15 0630  vancomycin (VANCOCIN) 1,250 mg in sodium chloride 0.9 % 250 mL IVPB     1,250 mg 166.7 mL/hr over 90 Minutes Intravenous Every 12 hours 01/26/15 0603     01/22/15 1600  piperacillin-tazobactam (ZOSYN) IVPB 3.375 g     3.375 g 12.5 mL/hr over 240 Minutes Intravenous 3 times per day 01/22/15 1352     01/22/15 1430  azithromycin (ZITHROMAX) 500 mg in dextrose 5 % 250 mL IVPB     500 mg 250 mL/hr over 60 Minutes Intravenous  Once 01/22/15 1351 01/22/15 1606   01/22/15 1400  piperacillin-tazobactam (ZOSYN) IVPB 3.375 g  Status:  Discontinued     3.375 g 12.5 mL/hr over 240 Minutes Intravenous 3 times per day 01/22/15 0951 01/22/15 1352   01/22/15 1200  vancomycin (VANCOCIN) IVPB 1000 mg/200 mL premix  Status:  Discontinued     1,000 mg 200 mL/hr over 60 Minutes Intravenous Every 18  hours 01/22/15 1124 01/26/15 0603   01/22/15 1000  vancomycin (VANCOCIN) IVPB 1000 mg/200 mL premix  Status:  Discontinued     1,000 mg 200 mL/hr over 60 Minutes Intravenous Every 24 hours 01/22/15 0951 01/22/15 1124   01/22/15 0845  cefTRIAXone (ROCEPHIN) 1 g in dextrose 5 % 50 mL IVPB     1 g 100 mL/hr over 30 Minutes Intravenous  Once 01/22/15 0843 01/22/15 0931   01/22/15 0845  azithromycin (ZITHROMAX) 500 mg in dextrose 5 % 250 mL IVPB  Status:  Discontinued     500 mg 250 mL/hr over 60 Minutes Intravenous  Once 01/22/15 0843 01/22/15 1351      Medications   Scheduled Meds: . aspirin EC  325 mg Oral Daily  . atorvastatin  80 mg Oral BH-[id number]  . enoxaparin (LOVENOX) injection  40 mg Subcutaneous Q24H  . ferrous sulfate  325 mg Oral Daily  . guaiFENesin  600 mg Oral BID  . losartan  50 mg Oral Daily  . metoprolol  100 mg Oral Daily  . piperacillin-tazobactam (ZOSYN)  IV  3.375 g Intravenous 3 times per day  . polyethylene glycol  17 g Oral Daily  . vancomycin  1,250 mg Intravenous Q12H   Continuous Infusions: . sodium chloride 75 mL/hr at 01/26/15 1940   PRN Meds:.acetaminophen **OR** acetaminophen, guaiFENesin-codeine, HYDROcodone-acetaminophen, lactulose, morphine injection, nicotine polacrilex, ondansetron **OR** ondansetron (ZOFRAN) IV, senna-docusate, zolpidem   Data Review:   Micro Results Recent Results (from the past 240 hour(s))  Culture, blood (routine x 2)     Status: None   Collection Time: 01/22/15  9:02 AM  Result Value Ref Range Status   Specimen Description BLOOD  Final   Special Requests NONE  Final   Culture NO GROWTH 5 DAYS  Final   Report Status 01/27/2015 FINAL  Final  Culture, blood (routine x 2)     Status: None   Collection Time: 01/22/15  9:02 AM  Result Value Ref Range Status  Specimen Description BLOOD  Final   Special Requests NONE  Final   Culture NO GROWTH 5 DAYS  Final   Report Status 01/27/2015 FINAL  Final  Culture,  sputum-assessment     Status: None   Collection Time: 01/25/15  2:47 AM  Result Value Ref Range Status   Specimen Description SPU  Final   Special Requests NONE  Final   Sputum evaluation   Final    THIS SPECIMEN IS ACCEPTABLE FOR SPUTUM CULTURE CTJ 01/25/15    Report Status 01/25/2015 FINAL  Final  Culture, respiratory (NON-Expectorated)     Status: None (Preliminary result)   Collection Time: 01/25/15  2:47 AM  Result Value Ref Range Status   Specimen Description SPU  Final   Special Requests NONE Reflexed from [id number]  Final   Gram Stain PENDING  Incomplete   Culture APPEARS TO NORMAL FLORA PRESENT  Final   Report Status PENDING  Incomplete    Radiology Reports Dg Chest 2 View  01/27/2015   CLINICAL DATA:  Shortness of breath and cough, followup  EXAM: CHEST  2 VIEW  COMPARISON:  CT chest of 01/22/2015 and chest x-ray of 01/22/2015  FINDINGS: There does appear to have been some interval worsening of the consolidation of a portion of the right upper lobe with areas of cavitation suggesting necrotic pneumonia. Vague opacity at the left costophrenic angle may represent scarring or patchy pneumonia as well. No definite effusion is seen. Heart size is normal. No bony abnormality is seen.  IMPRESSION: Perhaps slight worsening of right upper lobe opacity most consistent with necrotic pneumonia with areas of apparent cavitation.   Electronically Signed   By: Ivar Drape M.D.   On: 01/27/2015 08:05   Dg Chest 2 View  01/22/2015   CLINICAL DATA:  Pleurisy. Right-sided pain, back pain for 1 week. History of COPD.  EXAM: CHEST  2 VIEW  COMPARISON:  10/15/2013  FINDINGS: Within the posterior right upper lobe is a a 4.7 cm rounded masslike opacity, with surrounding ill-defined density. Additional ill-defined density in the right middle lobe. Underlying emphysema again seen. Probable atelectasis or scarring at the left costophrenic angle. The heart size and mediastinal contours are normal, mild  atherosclerosis of the thoracic aorta. No pleural effusion. No pneumothorax. There is degenerative change in the spine.  IMPRESSION: Rounded 4.7 since masslike opacity in the right upper lobe, with surrounding ill-defined density. This may reflect pneumonia versus neoplasm. There is additional opacity in the right middle lobe concerning for pneumonia.  Chest CT correlation versus radiographic follow-up recommended. While traditional follow-up for pneumonia would be in 3-4 weeks after course of antibiotics, given the masslike appearance, shorter interval follow-up is recommended in 1-2 weeks.   Electronically Signed   By: Jeb Levering M.D.   On: 01/22/2015 06:42   Ct Chest Wo Contrast  01/22/2015   CLINICAL DATA:  Five day history of right-sided chest pain, worsening over the past 2 days. Right upper lobe lung mass on earlier chest x-ray.  EXAM: CT CHEST WITHOUT CONTRAST  TECHNIQUE: Multidetector CT imaging of the chest was performed following the standard protocol without IV contrast.  COMPARISON:  CT chest 10/14/2012. Two-view chest x-ray earlier same date.  FINDINGS: Lack of intravenous contrast makes the examination less sensitive than it would be otherwise. Severe emphysematous changes throughout both lungs with dense masslike opacity in the right upper lobe. Gas bubbles within the opacity. Extensive bullous changes are present in the right upper lobe in the  area of the opacity. Patchy airspace opacities are present more inferiorly in the right lower lobe. Minimal patchy airspace opacities anteriorly in the right middle lobe. Masslike pleural-based opacity in the inferior left lower lobe measuring approximately 1.8 x 2.1 x 2.0 cm.  Multiple bilateral lung nodules, many of which are new since the prior CT. The largest nodule is in the inferior left upper lobe and measures approximately 5 mm (series 3, image 30).  No associated pleural effusions.  No pleural masses.  Normal heart size with extensive 3  vessel coronary atherosclerosis. Severe atherosclerosis involving the thoracic and upper abdominal aorta and their visualized branches without evidence of aneurysm. No visible mediastinal, hilar or axillary lymphadenopathy. Visualized thyroid gland unremarkable.  Visualized upper abdomen unremarkable. 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  IMPRESSION: 1. 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 2. Approximate 2 cm mass in the inferior left lower lobe. 3. Numerous bilateral pulmonary nodules, the largest approximating 5 mm in the left lower lobe. 4. Severe COPD/emphysema. 5. No visible mediastinal, hilar or axillary lymphadenopathy, allowing for the unenhanced technique.   Electronically Signed   By: Evangeline Dakin M.D.   On: 01/22/2015 08:29   Ct Chest W Contrast  01/22/2015   CLINICAL DATA:  Right upper lobe lung mass with productive cough and chest pain.  EXAM: CT CHEST, ABDOMEN, AND PELVIS WITH CONTRAST  TECHNIQUE: Multidetector CT imaging of the chest, abdomen and pelvis was performed following the standard protocol during bolus administration of intravenous contrast.  CONTRAST:  158mL OMNIPAQUE IOHEXOL 300 MG/ML  SOLN  COMPARISON:  Unenhanced CT of the chest earlier today at 737 hours and prior CT on 10/14/2012.  FINDINGS: CT CHEST FINDINGS  Geographic area of peripheral consolidation in the lateral and posterior right upper lobe spans over a region measuring roughly 3.7 x 7.3 x 4.4 cm. Areas of gas formation are consistent with necrosis and there is a small posterior component of focal fluid attenuation measuring roughly 1.5 cm in diameter. Based on the contrast-enhanced CT, this may represent a necrotic pneumonia and is not necessarily representative of malignancy. The small area of liquefaction may represent early focal abscess formation. Additional  ground-glass and alveolar opacity in the anterior right upper lobe and right middle lobe likely represent acute infection and extension of infiltrate. Clinical and imaging follow-up is recommended. If the process does not resolve, or worsens on antibiotic therapy, Pulmonary Medicine evaluation is recommended.  The area of mass-like opacity measured in the anterior aspect of the lateral left lower lobe is again visualized. This is also somewhat geographic in appearance and may not represent a focal mass. Focal atelectasis and scarring is possible. Malignancy is not completely excluded. A 6-7 mm anterior right upper lobe nodule is stable since 2014. A 5 mm anterior left upper lobe nodule is stable since 2014. A 5 mm inferior lingular nodule is stable since 2014. 4 mm subpleural nodule in the right middle lobe is stable since 2014. There are 2 tiny subpleural nodules in the posterior right lower lobe. One of these located more medially was not as apparent on the 2014 study. Tiny lateral subpleural nodules in the right lower lobe appears stable since the prior study in 2014.  No airway obstruction. Mildly prominent right hilar lymph node measures approximately 11 mm in short axis. There are a few tiny additional mediastinal nodes with no enlarged lymph  nodes seen. No pleural or pericardial fluid is seen. The heart size is normal. Mild spondylosis present of the thoracic spine.  CT ABDOMEN AND PELVIS FINDINGS  Solid organs show no evidence of metastatic disease. The liver, pancreas, spleen, adrenal glands and kidneys are normal. There is a small hiatal hernia. The gallbladder has been removed. No enlarged lymph nodes are seen.  Bowel is unremarkable and shows no evidence of obstruction or lesion. No free fluid, free air or abscess is identified. Small right inguinal hernia contains fat. The bladder is unremarkable. The uterus has been removed. The abdominal aorta and iliac arteries show atherosclerotic calcifications  without aneurysmal disease or significant obstructive disease identified. No bony lesions are identified.  IMPRESSION: 1. The geographic area of peripheral consolidation in the right upper lobe has an appearance most likely consistent with necrotic pneumonia with additional small early abscess formation measuring 1.5 cm within this region. Although malignancy cannot be excluded, necrotic pneumonia is favored. Additional alveolar and ground-glass infiltrate extends into the anterior right upper lobe and right middle lobe. If the process does not resolve or worsens, pulmonary Medicine evaluation is recommended. Associated 11 mm right hilar lymph node may be reactive or metastatic. 2. The peripheral lateral left lower lobe opacity may represent focal atelectasis and scarring. Malignancy cannot be excluded, however. Once the pneumonia of the right upper lobe has been adequately treated, if there is persistent opacity in the left lower lobe, PET scan evaluation may be helpful. 3. All of the subcentimeter small nodules identified bilaterally are largely peripheral/subpleural and are stable since the CT in 2014. One of the right lower lobe nodules may be new/more prominent but only measures a few mm in diameter. All of these tiny nodules are most likely postinflammatory and not representative of metastatic nodules. 4. No evidence of pathology in the abdomen or pelvis. No acute findings or evidence of metastatic disease. Incidental small hiatal hernia and small right inguinal hernia containing fat.   Electronically Signed   By: Aletta Edouard M.D.   On: 01/22/2015 13:45   Ct Abdomen Pelvis W Contrast  01/22/2015   CLINICAL DATA:  Right upper lobe lung mass with productive cough and chest pain.  EXAM: CT CHEST, ABDOMEN, AND PELVIS WITH CONTRAST  TECHNIQUE: Multidetector CT imaging of the chest, abdomen and pelvis was performed following the standard protocol during bolus administration of intravenous contrast.  CONTRAST:   179mL OMNIPAQUE IOHEXOL 300 MG/ML  SOLN  COMPARISON:  Unenhanced CT of the chest earlier today at 737 hours and prior CT on 10/14/2012.  FINDINGS: CT CHEST FINDINGS  Geographic area of peripheral consolidation in the lateral and posterior right upper lobe spans over a region measuring roughly 3.7 x 7.3 x 4.4 cm. Areas of gas formation are consistent with necrosis and there is a small posterior component of focal fluid attenuation measuring roughly 1.5 cm in diameter. Based on the contrast-enhanced CT, this may represent a necrotic pneumonia and is not necessarily representative of malignancy. The small area of liquefaction may represent early focal abscess formation. Additional ground-glass and alveolar opacity in the anterior right upper lobe and right middle lobe likely represent acute infection and extension of infiltrate. Clinical and imaging follow-up is recommended. If the process does not resolve, or worsens on antibiotic therapy, Pulmonary Medicine evaluation is recommended.  The area of mass-like opacity measured in the anterior aspect of the lateral left lower lobe is again visualized. This is also somewhat geographic in appearance and may not represent  a focal mass. Focal atelectasis and scarring is possible. Malignancy is not completely excluded. A 6-7 mm anterior right upper lobe nodule is stable since 2014. A 5 mm anterior left upper lobe nodule is stable since 2014. A 5 mm inferior lingular nodule is stable since 2014. 4 mm subpleural nodule in the right middle lobe is stable since 2014. There are 2 tiny subpleural nodules in the posterior right lower lobe. One of these located more medially was not as apparent on the 2014 study. Tiny lateral subpleural nodules in the right lower lobe appears stable since the prior study in 2014.  No airway obstruction. Mildly prominent right hilar lymph node measures approximately 11 mm in short axis. There are a few tiny additional mediastinal nodes with no  enlarged lymph nodes seen. No pleural or pericardial fluid is seen. The heart size is normal. Mild spondylosis present of the thoracic spine.  CT ABDOMEN AND PELVIS FINDINGS  Solid organs show no evidence of metastatic disease. The liver, pancreas, spleen, adrenal glands and kidneys are normal. There is a small hiatal hernia. The gallbladder has been removed. No enlarged lymph nodes are seen.  Bowel is unremarkable and shows no evidence of obstruction or lesion. No free fluid, free air or abscess is identified. Small right inguinal hernia contains fat. The bladder is unremarkable. The uterus has been removed. The abdominal aorta and iliac arteries show atherosclerotic calcifications without aneurysmal disease or significant obstructive disease identified. No bony lesions are identified.  IMPRESSION: 1. The geographic area of peripheral consolidation in the right upper lobe has an appearance most likely consistent with necrotic pneumonia with additional small early abscess formation measuring 1.5 cm within this region. Although malignancy cannot be excluded, necrotic pneumonia is favored. Additional alveolar and ground-glass infiltrate extends into the anterior right upper lobe and right middle lobe. If the process does not resolve or worsens, pulmonary Medicine evaluation is recommended. Associated 11 mm right hilar lymph node may be reactive or metastatic. 2. The peripheral lateral left lower lobe opacity may represent focal atelectasis and scarring. Malignancy cannot be excluded, however. Once the pneumonia of the right upper lobe has been adequately treated, if there is persistent opacity in the left lower lobe, PET scan evaluation may be helpful. 3. All of the subcentimeter small nodules identified bilaterally are largely peripheral/subpleural and are stable since the CT in 2014. One of the right lower lobe nodules may be new/more prominent but only measures a few mm in diameter. All of these tiny nodules are  most likely postinflammatory and not representative of metastatic nodules. 4. No evidence of pathology in the abdomen or pelvis. No acute findings or evidence of metastatic disease. Incidental small hiatal hernia and small right inguinal hernia containing fat.   Electronically Signed   By: Aletta Edouard M.D.   On: 01/22/2015 13:45     CBC  Recent Labs Lab 01/22/15 0604 01/23/15 0424 01/24/15 0616  WBC 12.2* 12.0* 8.5  HGB 11.3* 10.3* 9.0*  HCT 34.0* 31.7* 27.4*  PLT 272 228 222  MCV 94.5 94.7 95.3  MCH 31.3 30.8 31.4  MCHC 33.1 32.5 32.9  RDW 15.1* 14.8* 15.2*    Chemistries   Recent Labs Lab 01/22/15 0604 01/23/15 0424 01/24/15 0616 01/27/15 0915  NA 134* 134* 137  --   K 3.7 3.9 3.5  --   CL 100* 101 105  --   CO2 23 24 24   --   GLUCOSE 106* 100* 99  --  BUN 15 16 9   --   CREATININE 1.05* 1.11* 0.98 0.79  CALCIUM 9.2 8.4* 8.3*  --    ------------------------------------------------------------------------------------------------------------------ estimated creatinine clearance is 65.4 mL/min (by C-G formula based on Cr of 0.79). ------------------------------------------------------------------------------------------------------------------ No results for input(s): HGBA1C in the last 72 hours. ------------------------------------------------------------------------------------------------------------------ No results for input(s): CHOL, HDL, LDLCALC, TRIG, CHOLHDL, LDLDIRECT in the last 72 hours. ------------------------------------------------------------------------------------------------------------------ No results for input(s): TSH, T4TOTAL, T3FREE, THYROIDAB in the last 72 hours.  Invalid input(s): FREET3 ------------------------------------------------------------------------------------------------------------------ No results for input(s): VITAMINB12, FOLATE, FERRITIN, TIBC, IRON, RETICCTPCT in the last 72 hours.  Coagulation profile No results for  input(s): INR, PROTIME in the last 168 hours.  No results for input(s): DDIMER in the last 72 hours.  Cardiac Enzymes  Recent Labs Lab 01/22/15 0604  TROPONINI <0.03   ------------------------------------------------------------------------------------------------------------------ Invalid input(s): POCBNP    Assessment & Plan   Active Problems:   Necrotizing pneumonia IMPRESSION AND PLAN: Patient is a 59 year old African-American female with history of nicotine addiction, COPD presents with right-sided chest pain noted to have a mass as well as possible pneumonia.  #1. Right-sided pleuritic chest pain: Continue anabiotic's sputum cultures with no bacterial growth , continue anabiotic's for now until further ID recommendations  #2. COPD; there is no evidence of acute exacerbation continue her on Advair and Spiriva. And Combivent inhalers as needed.  #3. Peripheral vascular disease;   aspirin  #4. Nicotine addiction smoking cessation done     Code Status Orders        Start     Ordered   01/22/15 0947  Full code   Continuous     01/22/15 0948      Family Communication: Patient   Disposition Plan: Home   Procedures none   Consults pulmonary consult pending   DVT Prophylaxis  SCDs  Lab Results  Component Value Date   PLT 222 01/24/2015     Time Spent in minutes 25 minutes   Dustin Flock M.D on 01/27/2015 at 1:05 PM  Between 7am to 6pm - Pager - [phone]  After 6pm go to [email or web address] - password [email or web address] [facility]   Office  [phone]

## 2015-01-27 NOTE — Progress Notes (Signed)
Notified by nurse tech of pt high temperature (102.8 oral) and low oxygen saturation (85% on RA).  Tylenol given. 2L oxygen via [place] applied increasing saturations to 92%. Will continue to assess.

## 2015-01-27 NOTE — Plan of Care (Signed)
Problem: Discharge Progression Outcomes Goal: Discharge plan in place and appropriate Individualization  Outcome: Progressing Patient prefers to be called Jayne, from home.      Current smoker.      Hx: COPD, HTN, MI, PVD - controlled with home medications.      Moderate Fall Risk. Pt shows understanding how to utilize call system for assistance. Goal: Other Discharge Outcomes/Goals Outcome: Progressing Plan of care progress to goals: 1. Complaints of chest pain related to with productive cough. Robitussin/codeine syrup given for cough.          - Cepacol lozenges given for cough.     - Improvement noted.   2. Hemodynamically     - Febrile. Remains on IV antibiotics.        -Airborne precautions discontinued due to negative AFB. Zosyn discontinued. Augmentin ordered in        place of Zosyn. 3. -Remains on IVFs.  4. -No s/s of complications.   5. -Tolerating 2-gram sodium diet.   6. -Activity: Independent.       No fall or injuries noted this shift.

## 2015-01-27 NOTE — Progress Notes (Signed)
Temperature down 99.9 after PO Tylenol and cool application. Pt states she is feeling some better. Will continue to assess.

## 2015-01-28 ENCOUNTER — Encounter: Payer: Self-pay | Admitting: Radiology

## 2015-01-28 ENCOUNTER — Inpatient Hospital Stay: Payer: Medicare Other

## 2015-01-28 LAB — CBC WITH DIFFERENTIAL/PLATELET
Basophils Absolute: 0 10*3/uL (ref 0–0.1)
Basophils Relative: 0 %
EOS PCT: 1 %
Eosinophils Absolute: 0 10*3/uL (ref 0–0.7)
HCT: 28.3 % — ABNORMAL LOW (ref 35.0–47.0)
Hemoglobin: 9.2 g/dL — ABNORMAL LOW (ref 12.0–16.0)
LYMPHS ABS: 0.6 10*3/uL — AB (ref 1.0–3.6)
LYMPHS PCT: 7 %
MCH: 30.8 pg (ref 26.0–34.0)
MCHC: 32.6 g/dL (ref 32.0–36.0)
MCV: 94.7 fL (ref 80.0–100.0)
MONOS PCT: 4 %
Monocytes Absolute: 0.3 10*3/uL (ref 0.2–0.9)
NEUTROS PCT: 88 %
Neutro Abs: 7.3 10*3/uL — ABNORMAL HIGH (ref 1.4–6.5)
Platelets: 349 10*3/uL (ref 150–440)
RBC: 2.99 MIL/uL — AB (ref 3.80–5.20)
RDW: 15.8 % — AB (ref 11.5–14.5)
WBC: 8.2 10*3/uL (ref 3.6–11.0)

## 2015-01-28 LAB — VANCOMYCIN, TROUGH: Vancomycin Tr: 14 ug/mL (ref 10–20)

## 2015-01-28 MED ORDER — PIPERACILLIN-TAZOBACTAM 3.375 G IVPB
3.3750 g | Freq: Three times a day (TID) | INTRAVENOUS | Status: DC
  Administered 2015-01-28 – 2015-02-02 (×14): 3.375 g via INTRAVENOUS
  Filled 2015-01-28 (×18): qty 50

## 2015-01-28 MED ORDER — VANCOMYCIN HCL 10 G IV SOLR
1250.0000 mg | Freq: Two times a day (BID) | INTRAVENOUS | Status: DC
  Filled 2015-01-28 (×3): qty 1250

## 2015-01-28 MED ORDER — VANCOMYCIN HCL 10 G IV SOLR
1500.0000 mg | Freq: Two times a day (BID) | INTRAVENOUS | Status: DC
  Administered 2015-01-28 – 2015-01-30 (×4): 1500 mg via INTRAVENOUS
  Filled 2015-01-28 (×5): qty 1500

## 2015-01-28 MED ORDER — CLINDAMYCIN PHOSPHATE 600 MG/50ML IV SOLN
600.0000 mg | Freq: Four times a day (QID) | INTRAVENOUS | Status: DC
  Administered 2015-01-28 – 2015-02-02 (×20): 600 mg via INTRAVENOUS
  Filled 2015-01-28 (×24): qty 50

## 2015-01-28 MED ORDER — PIPERACILLIN-TAZOBACTAM 3.375 G IVPB
3.3750 g | Freq: Four times a day (QID) | INTRAVENOUS | Status: DC
  Filled 2015-01-28 (×5): qty 50

## 2015-01-28 MED ORDER — IOHEXOL 300 MG/ML  SOLN
75.0000 mL | Freq: Once | INTRAMUSCULAR | Status: AC | PRN
  Administered 2015-01-28: 10:00:00 75 mL via INTRAVENOUS

## 2015-01-28 NOTE — Progress Notes (Signed)
[place] at [facility]                                                                                                                                                                                            Patient Demographics   [name], is a 59 y.o. female, DOB - 03-31-1956, KDT:[id number]  Admit date - 01/22/2015   Admitting Physician Dustin Flock, MD  Outpatient Primary MD for the patient is No primary care provider on file.  LOS - 6  Chief Complaint  Patient presents with  . Pleurisy   Patient antibiotics stopped yesterday, last night started having sob and couging, fever, repeat ct showes worsening pna    Review of Systems:   CONSTITUTIONAL: No documented fever. No fatigue, weakness. No weight gain, no weight loss.  EYES: No blurry or double vision.  ENT: No tinnitus. No postnasal drip. No redness of the oropharynx.  RESPIRATORY: Dry cough, no wheeze, no hemoptysis. Mild dyspnea.  CARDIOVASCULAR: Right-sided chest pain. No orthopnea. No palpitations. No syncope.  GASTROINTESTINAL: No nausea, no vomiting or diarrhea. No abdominal pain. No melena or hematochezia.  GENITOURINARY: No dysuria or hematuria.  ENDOCRINE: No polyuria or nocturia. No heat or cold intolerance.  HEMATOLOGY: No anemia. No bruising. No bleeding.  INTEGUMENTARY: No rashes. No lesions.  MUSCULOSKELETAL: No arthritis. No swelling. No gout.  NEUROLOGIC: No numbness, tingling, or ataxia. No seizure-type activity.  PSYCHIATRIC: No anxiety. No insomnia. No ADD.    Vitals:   Filed Vitals:   01/27/15 2109 01/27/15 2210 01/28/15 0505 01/28/15 0749  BP: 108/54  126/76 158/86  Pulse: 85  69 78  Temp: 102.8 F (39.3 C) 99.9 F (37.7 C) 98.6 F (37 C) 97.9 F (36.6 C)  TempSrc: Oral Oral Oral Oral  Resp: 18  18 20   Height:      Weight:      SpO2: 91%  98% 99%    Wt Readings from Last 3 Encounters:  01/22/15 57.267 kg (126 lb 4 oz)     Intake/Output  Summary (Last 24 hours) at 01/28/15 1326 Last data filed at 01/28/15 0542  Gross per 24 hour  Intake    750 ml  Output   2425 ml  Net  -1675 ml    Physical Exam:   GENERAL: Pleasant-appearing in no apparent distress.  HEAD, EYES, EARS, NOSE AND THROAT: Atraumatic, normocephalic. Extraocular muscles are intact. Pupils equal and reactive to light. Sclerae anicteric. No conjunctival injection. No oro-pharyngeal erythema.  NECK: Supple. There is no jugular venous distention. No bruits, no lymphadenopathy, no thyromegaly.  HEART: Regular rate and  rhythm, tachycardic. No murmurs, no rubs, no clicks.  LUNGS: Occasional rhonchi. No rales or  No wheezes.  ABDOMEN: Soft, flat, nontender, nondistended. Has good bowel sounds. No hepatosplenomegaly appreciated.  EXTREMITIES: No evidence of any cyanosis, clubbing, or peripheral edema.  +2 pedal and radial pulses bilaterally.  NEUROLOGIC: The patient is alert, awake, and oriented x3 with no focal motor or sensory deficits appreciated bilaterally.  SKIN: Moist and warm with no rashes appreciated.  Psych: Not anxious, depressed LN: No inguinal LN enlargement    Antibiotics   Anti-infectives    Start     Dose/Rate Route Frequency Ordered Stop   01/28/15 1330  piperacillin-tazobactam (ZOSYN) IVPB 3.375 g     3.375 g 12.5 mL/hr over 240 Minutes Intravenous 4 times per day 01/28/15 1325     01/28/15 1330  clindamycin (CLEOCIN) IVPB 600 mg     600 mg 100 mL/hr over 30 Minutes Intravenous 4 times per day 01/28/15 1325     01/27/15 2200  amoxicillin-clavulanate (AUGMENTIN) 875-125 MG per tablet 1 tablet  Status:  Discontinued     1 tablet Oral Every 12 hours 01/27/15 1702 01/28/15 1324   01/26/15 0630  vancomycin (VANCOCIN) 1,250 mg in sodium chloride 0.9 % 250 mL IVPB  Status:  Discontinued     1,250 mg 166.7 mL/hr over 90 Minutes Intravenous Every 12 hours 01/26/15 0603 01/28/15 0906   01/22/15 1600  piperacillin-tazobactam (ZOSYN) IVPB 3.375 g   Status:  Discontinued     3.375 g 12.5 mL/hr over 240 Minutes Intravenous 3 times per day 01/22/15 1352 01/27/15 1702   01/22/15 1430  azithromycin (ZITHROMAX) 500 mg in dextrose 5 % 250 mL IVPB     500 mg 250 mL/hr over 60 Minutes Intravenous  Once 01/22/15 1351 01/22/15 1606   01/22/15 1400  piperacillin-tazobactam (ZOSYN) IVPB 3.375 g  Status:  Discontinued     3.375 g 12.5 mL/hr over 240 Minutes Intravenous 3 times per day 01/22/15 0951 01/22/15 1352   01/22/15 1200  vancomycin (VANCOCIN) IVPB 1000 mg/200 mL premix  Status:  Discontinued     1,000 mg 200 mL/hr over 60 Minutes Intravenous Every 18 hours 01/22/15 1124 01/26/15 0603   01/22/15 1000  vancomycin (VANCOCIN) IVPB 1000 mg/200 mL premix  Status:  Discontinued     1,000 mg 200 mL/hr over 60 Minutes Intravenous Every 24 hours 01/22/15 0951 01/22/15 1124   01/22/15 0845  cefTRIAXone (ROCEPHIN) 1 g in dextrose 5 % 50 mL IVPB     1 g 100 mL/hr over 30 Minutes Intravenous  Once 01/22/15 0843 01/22/15 0931   01/22/15 0845  azithromycin (ZITHROMAX) 500 mg in dextrose 5 % 250 mL IVPB  Status:  Discontinued     500 mg 250 mL/hr over 60 Minutes Intravenous  Once 01/22/15 0843 01/22/15 1351      Medications   Scheduled Meds: . aspirin EC  325 mg Oral Daily  . atorvastatin  80 mg Oral [id number]  . clindamycin (CLEOCIN) IV  600 mg Intravenous 4 times per day  . enoxaparin (LOVENOX) injection  40 mg Subcutaneous Q24H  . ferrous sulfate  325 mg Oral Daily  . guaiFENesin  600 mg Oral BID  . losartan  50 mg Oral Daily  . metoprolol  100 mg Oral Daily  . piperacillin-tazobactam (ZOSYN)  IV  3.375 g Intravenous 4 times per day  . polyethylene glycol  17 g Oral Daily   Continuous Infusions: . sodium chloride 75 mL/hr at 01/28/15 1023  PRN Meds:.acetaminophen **OR** acetaminophen, guaiFENesin-codeine, HYDROcodone-acetaminophen, lactulose, menthol-cetylpyridinium, morphine injection, nicotine polacrilex, ondansetron **OR** ondansetron  (ZOFRAN) IV, senna-docusate, zolpidem   Data Review:   Micro Results Recent Results (from the past 240 hour(s))  Culture, blood (routine x 2)     Status: None   Collection Time: 01/22/15  9:02 AM  Result Value Ref Range Status   Specimen Description BLOOD  Final   Special Requests NONE  Final   Culture NO GROWTH 5 DAYS  Final   Report Status 01/27/2015 FINAL  Final  Culture, blood (routine x 2)     Status: None   Collection Time: 01/22/15  9:02 AM  Result Value Ref Range Status   Specimen Description BLOOD  Final   Special Requests NONE  Final   Culture NO GROWTH 5 DAYS  Final   Report Status 01/27/2015 FINAL  Final  Culture, sputum-assessment     Status: None   Collection Time: 01/25/15  2:47 AM  Result Value Ref Range Status   Specimen Description SPU  Final   Special Requests NONE  Final   Sputum evaluation   Final    THIS SPECIMEN IS ACCEPTABLE FOR SPUTUM CULTURE CTJ 01/25/15    Report Status 01/25/2015 FINAL  Final  Culture, respiratory (NON-Expectorated)     Status: None (Preliminary result)   Collection Time: 01/25/15  2:47 AM  Result Value Ref Range Status   Specimen Description SPU  Final   Special Requests NONE Reflexed from [id number]  Final   Gram Stain PENDING  Incomplete   Culture APPEARS TO NORMAL FLORA PRESENT  Final   Report Status PENDING  Incomplete    Radiology Reports Dg Chest 2 View  01/27/2015   CLINICAL DATA:  Shortness of breath and cough, followup  EXAM: CHEST  2 VIEW  COMPARISON:  CT chest of 01/22/2015 and chest x-ray of 01/22/2015  FINDINGS: There does appear to have been some interval worsening of the consolidation of a portion of the right upper lobe with areas of cavitation suggesting necrotic pneumonia. Vague opacity at the left costophrenic angle may represent scarring or patchy pneumonia as well. No definite effusion is seen. Heart size is normal. No bony abnormality is seen.  IMPRESSION: Perhaps slight worsening of right upper lobe opacity  most consistent with necrotic pneumonia with areas of apparent cavitation.   Electronically Signed   By: Ivar Drape M.D.   On: 01/27/2015 08:05   Dg Chest 2 View  01/22/2015   CLINICAL DATA:  Pleurisy. Right-sided pain, back pain for 1 week. History of COPD.  EXAM: CHEST  2 VIEW  COMPARISON:  10/15/2013  FINDINGS: Within the posterior right upper lobe is a a 4.7 cm rounded masslike opacity, with surrounding ill-defined density. Additional ill-defined density in the right middle lobe. Underlying emphysema again seen. Probable atelectasis or scarring at the left costophrenic angle. The heart size and mediastinal contours are normal, mild atherosclerosis of the thoracic aorta. No pleural effusion. No pneumothorax. There is degenerative change in the spine.  IMPRESSION: Rounded 4.7 since masslike opacity in the right upper lobe, with surrounding ill-defined density. This may reflect pneumonia versus neoplasm. There is additional opacity in the right middle lobe concerning for pneumonia.  Chest CT correlation versus radiographic follow-up recommended. While traditional follow-up for pneumonia would be in 3-4 weeks after course of antibiotics, given the masslike appearance, shorter interval follow-up is recommended in 1-2 weeks.   Electronically Signed   By: Jeb Levering M.D.   On: 01/22/2015  06:42   Ct Chest Wo Contrast  01/22/2015   CLINICAL DATA:  Five day history of right-sided chest pain, worsening over the past 2 days. Right upper lobe lung mass on earlier chest x-ray.  EXAM: CT CHEST WITHOUT CONTRAST  TECHNIQUE: Multidetector CT imaging of the chest was performed following the standard protocol without IV contrast.  COMPARISON:  CT chest 10/14/2012. Two-view chest x-ray earlier same date.  FINDINGS: Lack of intravenous contrast makes the examination less sensitive than it would be otherwise. Severe emphysematous changes throughout both lungs with dense masslike opacity in the right upper lobe. Gas  bubbles within the opacity. Extensive bullous changes are present in the right upper lobe in the area of the opacity. Patchy airspace opacities are present more inferiorly in the right lower lobe. Minimal patchy airspace opacities anteriorly in the right middle lobe. Masslike pleural-based opacity in the inferior left lower lobe measuring approximately 1.8 x 2.1 x 2.0 cm.  Multiple bilateral lung nodules, many of which are new since the prior CT. The largest nodule is in the inferior left upper lobe and measures approximately 5 mm (series 3, image 30).  No associated pleural effusions.  No pleural masses.  Normal heart size with extensive 3 vessel coronary atherosclerosis. Severe atherosclerosis involving the thoracic and upper abdominal aorta and their visualized branches without evidence of aneurysm. No visible mediastinal, hilar or axillary lymphadenopathy. Visualized thyroid gland unremarkable.  Visualized upper abdomen unremarkable. 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  IMPRESSION: 1. 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 2. Approximate 2 cm mass in the inferior left lower lobe. 3. Numerous bilateral pulmonary nodules, the largest approximating 5 mm in the left lower lobe. 4. Severe COPD/emphysema. 5. No visible mediastinal, hilar or axillary lymphadenopathy, allowing for the unenhanced technique.   Electronically Signed   By: Evangeline Dakin M.D.   On: 01/22/2015 08:29   Ct Chest W Contrast  01/28/2015   CLINICAL DATA:  Followup right upper lobe lung process.  EXAM: CT CHEST WITH CONTRAST  TECHNIQUE: Multidetector CT imaging of the chest was performed during intravenous contrast administration.  CONTRAST:  34mL OMNIPAQUE IOHEXOL 300 MG/ML  SOLN  COMPARISON:  CT scans 01/22/2015  FINDINGS: Chest wall: No breast masses, supraclavicular or axillary  lymphadenopathy. The thyroid gland appears normal. The bony thorax is intact. No destructive bone lesions or spinal canal compromise.  Mediastinum: The heart is normal in size. No pericardial effusion. Stable advanced atherosclerotic calcifications involving the aorta and branch vessels including the coronary arteries. The esophagus is grossly normal.  Stable small mediastinal lymph nodes. The right hilar node has enlarged slightly. It measures 20 x 21 mm on image number 25.  Lungs/pleura: There is a persistent and progressive necrotic appearing right upper lobe pneumonia. It previously measured a maximum of 7.3 x 2.8 cm and now measures 10.7 x 5.8 cm. Small abscesses are suspected. I do not see the discrete rim enhancing small abscess opposed demonstrated on the prior scan. There is also severe surrounding inflammation in the right lung with marked interstitial thickening and ground-glass opacity. I do not see a discrete mass or endobronchial lesion to suggest neoplasm.  There are numerous small bilateral pulmonary nodules which are unchanged. The nodular density at the left lung base adjacent to the major fissure in the left lower lobe measures approximately 11 mm. Recommend continued observation.  Upper abdomen:  No  significant findings.  IMPRESSION: 1. Worsening necrotic appearing right upper lobe pneumonia with possible small abscesses. Surrounding pneumonitis is also progressive. 2. Slight interval enlargement of right hilar lymph nodes. 3. Stable bilateral pulmonary nodules. 4. Bronchoscopy may be helpful for further evaluation. Repeat chest CT after appropriate antibiotic therapy is recommended.   Electronically Signed   By: Marijo Sanes M.D.   On: 01/28/2015 10:29   Ct Chest W Contrast  01/22/2015   CLINICAL DATA:  Right upper lobe lung mass with productive cough and chest pain.  EXAM: CT CHEST, ABDOMEN, AND PELVIS WITH CONTRAST  TECHNIQUE: Multidetector CT imaging of the chest, abdomen and pelvis was  performed following the standard protocol during bolus administration of intravenous contrast.  CONTRAST:  139mL OMNIPAQUE IOHEXOL 300 MG/ML  SOLN  COMPARISON:  Unenhanced CT of the chest earlier today at 737 hours and prior CT on 10/14/2012.  FINDINGS: CT CHEST FINDINGS  Geographic area of peripheral consolidation in the lateral and posterior right upper lobe spans over a region measuring roughly 3.7 x 7.3 x 4.4 cm. Areas of gas formation are consistent with necrosis and there is a small posterior component of focal fluid attenuation measuring roughly 1.5 cm in diameter. Based on the contrast-enhanced CT, this may represent a necrotic pneumonia and is not necessarily representative of malignancy. The small area of liquefaction may represent early focal abscess formation. Additional ground-glass and alveolar opacity in the anterior right upper lobe and right middle lobe likely represent acute infection and extension of infiltrate. Clinical and imaging follow-up is recommended. If the process does not resolve, or worsens on antibiotic therapy, Pulmonary Medicine evaluation is recommended.  The area of mass-like opacity measured in the anterior aspect of the lateral left lower lobe is again visualized. This is also somewhat geographic in appearance and may not represent a focal mass. Focal atelectasis and scarring is possible. Malignancy is not completely excluded. A 6-7 mm anterior right upper lobe nodule is stable since 2014. A 5 mm anterior left upper lobe nodule is stable since 2014. A 5 mm inferior lingular nodule is stable since 2014. 4 mm subpleural nodule in the right middle lobe is stable since 2014. There are 2 tiny subpleural nodules in the posterior right lower lobe. One of these located more medially was not as apparent on the 2014 study. Tiny lateral subpleural nodules in the right lower lobe appears stable since the prior study in 2014.  No airway obstruction. Mildly prominent right hilar lymph node  measures approximately 11 mm in short axis. There are a few tiny additional mediastinal nodes with no enlarged lymph nodes seen. No pleural or pericardial fluid is seen. The heart size is normal. Mild spondylosis present of the thoracic spine.  CT ABDOMEN AND PELVIS FINDINGS  Solid organs show no evidence of metastatic disease. The liver, pancreas, spleen, adrenal glands and kidneys are normal. There is a small hiatal hernia. The gallbladder has been removed. No enlarged lymph nodes are seen.  Bowel is unremarkable and shows no evidence of obstruction or lesion. No free fluid, free air or abscess is identified. Small right inguinal hernia contains fat. The bladder is unremarkable. The uterus has been removed. The abdominal aorta and iliac arteries show atherosclerotic calcifications without aneurysmal disease or significant obstructive disease identified. No bony lesions are identified.  IMPRESSION: 1. The geographic area of peripheral consolidation in the right upper lobe has an appearance most likely consistent with necrotic pneumonia with additional small early abscess formation measuring 1.5  cm within this region. Although malignancy cannot be excluded, necrotic pneumonia is favored. Additional alveolar and ground-glass infiltrate extends into the anterior right upper lobe and right middle lobe. If the process does not resolve or worsens, pulmonary Medicine evaluation is recommended. Associated 11 mm right hilar lymph node may be reactive or metastatic. 2. The peripheral lateral left lower lobe opacity may represent focal atelectasis and scarring. Malignancy cannot be excluded, however. Once the pneumonia of the right upper lobe has been adequately treated, if there is persistent opacity in the left lower lobe, PET scan evaluation may be helpful. 3. All of the subcentimeter small nodules identified bilaterally are largely peripheral/subpleural and are stable since the CT in 2014. One of the right lower lobe  nodules may be new/more prominent but only measures a few mm in diameter. All of these tiny nodules are most likely postinflammatory and not representative of metastatic nodules. 4. No evidence of pathology in the abdomen or pelvis. No acute findings or evidence of metastatic disease. Incidental small hiatal hernia and small right inguinal hernia containing fat.   Electronically Signed   By: Aletta Edouard M.D.   On: 01/22/2015 13:45   Ct Abdomen Pelvis W Contrast  01/22/2015   CLINICAL DATA:  Right upper lobe lung mass with productive cough and chest pain.  EXAM: CT CHEST, ABDOMEN, AND PELVIS WITH CONTRAST  TECHNIQUE: Multidetector CT imaging of the chest, abdomen and pelvis was performed following the standard protocol during bolus administration of intravenous contrast.  CONTRAST:  158mL OMNIPAQUE IOHEXOL 300 MG/ML  SOLN  COMPARISON:  Unenhanced CT of the chest earlier today at 737 hours and prior CT on 10/14/2012.  FINDINGS: CT CHEST FINDINGS  Geographic area of peripheral consolidation in the lateral and posterior right upper lobe spans over a region measuring roughly 3.7 x 7.3 x 4.4 cm. Areas of gas formation are consistent with necrosis and there is a small posterior component of focal fluid attenuation measuring roughly 1.5 cm in diameter. Based on the contrast-enhanced CT, this may represent a necrotic pneumonia and is not necessarily representative of malignancy. The small area of liquefaction may represent early focal abscess formation. Additional ground-glass and alveolar opacity in the anterior right upper lobe and right middle lobe likely represent acute infection and extension of infiltrate. Clinical and imaging follow-up is recommended. If the process does not resolve, or worsens on antibiotic therapy, Pulmonary Medicine evaluation is recommended.  The area of mass-like opacity measured in the anterior aspect of the lateral left lower lobe is again visualized. This is also somewhat geographic in  appearance and may not represent a focal mass. Focal atelectasis and scarring is possible. Malignancy is not completely excluded. A 6-7 mm anterior right upper lobe nodule is stable since 2014. A 5 mm anterior left upper lobe nodule is stable since 2014. A 5 mm inferior lingular nodule is stable since 2014. 4 mm subpleural nodule in the right middle lobe is stable since 2014. There are 2 tiny subpleural nodules in the posterior right lower lobe. One of these located more medially was not as apparent on the 2014 study. Tiny lateral subpleural nodules in the right lower lobe appears stable since the prior study in 2014.  No airway obstruction. Mildly prominent right hilar lymph node measures approximately 11 mm in short axis. There are a few tiny additional mediastinal nodes with no enlarged lymph nodes seen. No pleural or pericardial fluid is seen. The heart size is normal. Mild spondylosis present of the  thoracic spine.  CT ABDOMEN AND PELVIS FINDINGS  Solid organs show no evidence of metastatic disease. The liver, pancreas, spleen, adrenal glands and kidneys are normal. There is a small hiatal hernia. The gallbladder has been removed. No enlarged lymph nodes are seen.  Bowel is unremarkable and shows no evidence of obstruction or lesion. No free fluid, free air or abscess is identified. Small right inguinal hernia contains fat. The bladder is unremarkable. The uterus has been removed. The abdominal aorta and iliac arteries show atherosclerotic calcifications without aneurysmal disease or significant obstructive disease identified. No bony lesions are identified.  IMPRESSION: 1. The geographic area of peripheral consolidation in the right upper lobe has an appearance most likely consistent with necrotic pneumonia with additional small early abscess formation measuring 1.5 cm within this region. Although malignancy cannot be excluded, necrotic pneumonia is favored. Additional alveolar and ground-glass infiltrate  extends into the anterior right upper lobe and right middle lobe. If the process does not resolve or worsens, pulmonary Medicine evaluation is recommended. Associated 11 mm right hilar lymph node may be reactive or metastatic. 2. The peripheral lateral left lower lobe opacity may represent focal atelectasis and scarring. Malignancy cannot be excluded, however. Once the pneumonia of the right upper lobe has been adequately treated, if there is persistent opacity in the left lower lobe, PET scan evaluation may be helpful. 3. All of the subcentimeter small nodules identified bilaterally are largely peripheral/subpleural and are stable since the CT in 2014. One of the right lower lobe nodules may be new/more prominent but only measures a few mm in diameter. All of these tiny nodules are most likely postinflammatory and not representative of metastatic nodules. 4. No evidence of pathology in the abdomen or pelvis. No acute findings or evidence of metastatic disease. Incidental small hiatal hernia and small right inguinal hernia containing fat.   Electronically Signed   By: Aletta Edouard M.D.   On: 01/22/2015 13:45     CBC  Recent Labs Lab 01/22/15 0604 01/23/15 0424 01/24/15 0616 01/28/15 1016  WBC 12.2* 12.0* 8.5 8.2  HGB 11.3* 10.3* 9.0* 9.2*  HCT 34.0* 31.7* 27.4* 28.3*  PLT 272 228 222 349  MCV 94.5 94.7 95.3 94.7  MCH 31.3 30.8 31.4 30.8  MCHC 33.1 32.5 32.9 32.6  RDW 15.1* 14.8* 15.2* 15.8*  LYMPHSABS  --   --   --  0.6*  MONOABS  --   --   --  0.3  EOSABS  --   --   --  0.0  BASOSABS  --   --   --  0.0    Chemistries   Recent Labs Lab 01/22/15 0604 01/23/15 0424 01/24/15 0616 01/27/15 0915  NA 134* 134* 137  --   K 3.7 3.9 3.5  --   CL 100* 101 105  --   CO2 23 24 24   --   GLUCOSE 106* 100* 99  --   BUN 15 16 9   --   CREATININE 1.05* 1.11* 0.98 0.79  CALCIUM 9.2 8.4* 8.3*  --     ------------------------------------------------------------------------------------------------------------------ estimated creatinine clearance is 65.4 mL/min (by C-G formula based on Cr of 0.79). ------------------------------------------------------------------------------------------------------------------ No results for input(s): HGBA1C in the last 72 hours. ------------------------------------------------------------------------------------------------------------------ No results for input(s): CHOL, HDL, LDLCALC, TRIG, CHOLHDL, LDLDIRECT in the last 72 hours. ------------------------------------------------------------------------------------------------------------------ No results for input(s): TSH, T4TOTAL, T3FREE, THYROIDAB in the last 72 hours.  Invalid input(s): FREET3 ------------------------------------------------------------------------------------------------------------------ No results for input(s): VITAMINB12, FOLATE, FERRITIN, TIBC, IRON, RETICCTPCT  in the last 72 hours.  Coagulation profile No results for input(s): INR, PROTIME in the last 168 hours.  No results for input(s): DDIMER in the last 72 hours.  Cardiac Enzymes  Recent Labs Lab 01/22/15 0604  TROPONINI <0.03   ------------------------------------------------------------------------------------------------------------------ Invalid input(s): POCBNP    Assessment & Plan   Active Problems:   Necrotizing pneumonia IMPRESSION AND PLAN: Patient is a 59 year old African-American female with history of nicotine addiction, COPD presents with right-sided chest pain noted to have a mass as well as possible pneumonia.  #1.  Necorotizing pna- I have d/w dr. Humphrey Rolls and dr. Delora Fuel, ct worsening, recommends restarting patient on vanc and zosyn and clindamycin. Plan for bronch next week  #2. COPD; there is no evidence of acute exacerbation continue her on Advair and Spiriva. And Combivent inhalers as  needed.  #3. Peripheral vascular disease;   aspirin  #4. Nicotine addiction smoking cessation done     Code Status Orders        Start     Ordered   01/22/15 0947  Full code   Continuous     01/22/15 0948      Family Communication: Patient   Disposition Plan: Home   Procedures none   Consults pulmonary consult , ID consult   DVT Prophylaxis  SCDs  Lab Results  Component Value Date   PLT 349 01/28/2015     Time Spent in minutes 25 minutes   Dustin Flock M.D on 01/28/2015 at 1:26 PM  Between 7am to 6pm - Pager - [phone]  After 6pm go to [email or web address] - password [email or web address] [facility]   Office  [phone]

## 2015-01-28 NOTE — Plan of Care (Signed)
Problem: Discharge Progression Outcomes Goal: Discharge plan in place and appropriate Individualization  Outcome: Progressing Patient prefers to be called [name], from home.       Current smoker.       Hx: COPD, HTN, MI, PVD - controlled with home medications.       Moderate Fall Risk. Pt shows understanding how to utilize call system for assistance. Goal: Other Discharge Outcomes/Goals Outcome: Progressing Plan of care progress to goals: 1. Productive cough noted. Robitussin/codeine syrup given for cough.           - Cepacol lozenges given for cough.     - Improvement noted.    2. Hemodynamically     - Febrile. Remains on IV antibiotics.         -Vancomycin and Cleocin IV ordered by MD. 3. -Remains on IVFs.   4. -No s/s of complications.    5. -Tolerating 2-gram sodium diet.    6. -Activity: Independent.       No fall or injuries noted this shift.

## 2015-01-28 NOTE — Progress Notes (Signed)
Date of Admission:  01/22/2015   ID: [name] is a 59 y.o. female with  Cavitary pna  Active Problems:   Necrotizing pneumonia  Subjective: Fever recurred to 102.8 and back on O2. CT scan shows worsening infiltrate  Medications:  . aspirin EC  325 mg Oral Daily  . atorvastatin  80 mg Oral BH-q7a  . clindamycin (CLEOCIN) IV  600 mg Intravenous Q6H  . enoxaparin (LOVENOX) injection  40 mg Subcutaneous Q24H  . ferrous sulfate  325 mg Oral Daily  . guaiFENesin  600 mg Oral BID  . losartan  50 mg Oral Daily  . metoprolol  100 mg Oral Daily  . piperacillin-tazobactam (ZOSYN)  IV  3.375 g Intravenous 3 times per day  . polyethylene glycol  17 g Oral Daily  . vancomycin  1,500 mg Intravenous Q12H    Objective: Vital signs in last 24 hours: Temp:  [97.9 F (36.6 C)-102.8 F (39.3 C)] 97.9 F (36.6 C) (05/27 0749) Pulse Rate:  [69-85] 78 (05/27 0749) Resp:  [18-20] 20 (05/27 0749) BP: (108-158)/(54-86) 158/86 mmHg (05/27 0749) SpO2:  [91 %-99 %] 99 % (05/27 0749) GENERAL: lying in the bed with no acute distress, but diaphoretic EYES: Pupils equal, round, reactive to light and accommodation. No scleral icterus. Extraocular muscles intact.  HEENT: Head atraumatic, normocephalic. Oropharynx and nasopharynx clear.  NECK: Supple, no jugular venous distention. No thyroid enlargement, no tenderness.  LUNGS: Diminished breath sounds bilaterally, no wheezing, rales, positive rhonchi no crepitation. No use of accessory muscles of respiration.  CARDIOVASCULAR: S1, S2 normal. No murmurs, rubs, or gallops.  ABDOMEN: Soft, nontender, nondistended. Bowel sounds present. No organomegaly or mass.  EXTREMITIES: No pedal edema, cyanosis, or clubbing.  NEUROLOGIC: Cranial nerves II through XII are intact. Muscle strength 5/5 in all extremities. Sensation intact. Gait not checked.  PSYCHIATRIC: The patient is alert and oriented x 3.  SKIN: No obvious rash, lesion, or ulcer  Lab  Results  Recent Labs  01/27/15 0915 01/28/15 1016  WBC  --  8.2  HGB  --  9.2*  HCT  --  28.3*  CREATININE 0.79  --    Microbiology: Results for orders placed or performed during the hospital encounter of 01/22/15  Culture, blood (routine x 2)     Status: None   Collection Time: 01/22/15  9:02 AM  Result Value Ref Range Status   Specimen Description BLOOD  Final   Special Requests NONE  Final   Culture NO GROWTH 5 DAYS  Final   Report Status 01/27/2015 FINAL  Final  Culture, blood (routine x 2)     Status: None   Collection Time: 01/22/15  9:02 AM  Result Value Ref Range Status   Specimen Description BLOOD  Final   Special Requests NONE  Final   Culture NO GROWTH 5 DAYS  Final   Report Status 01/27/2015 FINAL  Final  Culture, sputum-assessment     Status: None   Collection Time: 01/25/15  2:47 AM  Result Value Ref Range Status   Specimen Description SPU  Final   Special Requests NONE  Final   Sputum evaluation   Final    THIS SPECIMEN IS ACCEPTABLE FOR SPUTUM CULTURE CTJ 01/25/15    Report Status 01/25/2015 FINAL  Final  Culture, respiratory (NON-Expectorated)     Status: None (Preliminary result)   Collection Time: 01/25/15  2:47 AM  Result Value Ref Range Status   Specimen Description SPU  Final   Special Requests NONE  Reflexed from [id number]  Final   Gram Stain PENDING  Incomplete   Culture APPEARS TO NORMAL FLORA PRESENT  Final   Report Status PENDING  Incomplete    Studies/Results:  Dg Chest 2 View  01/27/2015   CLINICAL DATA:  Shortness of breath and cough, followup  EXAM: CHEST  2 VIEW  COMPARISON:  CT chest of 01/22/2015 and chest x-ray of 01/22/2015  FINDINGS: There does appear to have been some interval worsening of the consolidation of a portion of the right upper lobe with areas of cavitation suggesting necrotic pneumonia. Vague opacity at the left costophrenic angle may represent scarring or patchy pneumonia as well. No definite effusion is seen. Heart size  is normal. No bony abnormality is seen.  IMPRESSION: Perhaps slight worsening of right upper lobe opacity most consistent with necrotic pneumonia with areas of apparent cavitation.   Electronically Signed   By: Ivar Drape M.D.   On: 01/27/2015 08:05   Ct Chest W Contrast  01/28/2015   CLINICAL DATA:  Followup right upper lobe lung process.  EXAM: CT CHEST WITH CONTRAST  TECHNIQUE: Multidetector CT imaging of the chest was performed during intravenous contrast administration.  CONTRAST:  21mL OMNIPAQUE IOHEXOL 300 MG/ML  SOLN  COMPARISON:  CT scans 01/22/2015  FINDINGS: Chest wall: No breast masses, supraclavicular or axillary lymphadenopathy. The thyroid gland appears normal. The bony thorax is intact. No destructive bone lesions or spinal canal compromise.  Mediastinum: The heart is normal in size. No pericardial effusion. Stable advanced atherosclerotic calcifications involving the aorta and branch vessels including the coronary arteries. The esophagus is grossly normal.  Stable small mediastinal lymph nodes. The right hilar node has enlarged slightly. It measures 20 x 21 mm on image number 25.  Lungs/pleura: There is a persistent and progressive necrotic appearing right upper lobe pneumonia. It previously measured a maximum of 7.3 x 2.8 cm and now measures 10.7 x 5.8 cm. Small abscesses are suspected. I do not see the discrete rim enhancing small abscess opposed demonstrated on the prior scan. There is also severe surrounding inflammation in the right lung with marked interstitial thickening and ground-glass opacity. I do not see a discrete mass or endobronchial lesion to suggest neoplasm.  There are numerous small bilateral pulmonary nodules which are unchanged. The nodular density at the left lung base adjacent to the major fissure in the left lower lobe measures approximately 11 mm. Recommend continued observation.  Upper abdomen:  No significant findings.  IMPRESSION: 1. Worsening necrotic appearing right  upper lobe pneumonia with possible small abscesses. Surrounding pneumonitis is also progressive. 2. Slight interval enlargement of right hilar lymph nodes. 3. Stable bilateral pulmonary nodules. 4. Bronchoscopy may be helpful for further evaluation. Repeat chest CT after appropriate antibiotic therapy is recommended.   Electronically Signed   By: Marijo Sanes M.D.   On: 01/28/2015 10:29    Assessment/Plan: Jeree Delcid is a 59 y.o. female hx of COPD, HTN PVD ongoing tobacco abuse admitted 5/21 with 2 weeks cough and pleurtiic chest pain. Found to have R sided infiltrate vs mass. On further questioning reports 3 months of slowly progressive pleurtic pain. PPD is negative. HIV negative. QFG indtereminate.  CT shows impressive infiltrate, likely necrotizing PNA. FU CT shows worsening.   Rec AFB are negative x 3 and TB Xpert PCR neg as well - this makes TB very unlikley. Sputum culture is negative except for nml oral flora Given worsening findings would agree with need for  Bronch with  samples sent for fungal afb and bacterial culture.  WIll need eval for malignancy as well. Agree with addition of clindamycin  There does not seem to be enough of an effusion to require tapping  [name]   01/28/2015, 2:41 PM

## 2015-01-28 NOTE — Care Management (Signed)
Airborne precautions discontinued yesterday, Temperature 102.8-99.9 today. CT of head ordered per Dr. Posey Pronto.  Shelbie Ammons RN MSN Care Management [phone]

## 2015-01-28 NOTE — Progress Notes (Signed)
. Pulmonary Critical Care  Initial Consult Note   [name] TDD:[id number] DOB: 06-22-1956 DOA: 01/22/2015  Referring physician: Dustin Flock, MD PCP: No primary care provider on file.    Subjective: She had a fever and has some cough noted. Patient had a CT scan shows increased area of density. May need a bronch  Review of Systems:   ROS performed and is unremarkable other than above  Past Medical History  Diagnosis Date  . 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negative cardiac cath  . COPD (chronic obstructive pulmonary disease)   . Hypertension   . Peripheral vascular disease   . Nicotine addiction   . Asthma    Past Surgical History  Procedure Laterality Date  . Endovascular stent insertion Bilateral     Legs  . Cardiac surgery    . Hemorrhoid surgery     Social History:  reports that she has been smoking Cigarettes.  She has been smoking about 0.50 packs per day. She has never used smokeless tobacco. She reports that she drinks alcohol. She reports that she does not use illicit drugs.  Allergies  Allergen Reactions  . Aspirin Nausea And Vomiting and Other (See Comments)    Pt states aspirin makes her cramp and have to use coated kind.    Family History  Problem Relation Age of Onset  . Hypertension Mother   . Hypertension Father     Prior to Admission medications   Medication Sig Start Date End Date Taking? Authorizing Provider  aspirin EC 325 MG tablet Take 325 mg by mouth daily.   Yes Historical Provider, MD  atorvastatin (LIPITOR) 80 MG tablet Take 80 mg by mouth every morning.  11/15/14  Yes Historical Provider, MD  cetirizine (ZYRTEC) 10 MG tablet Take 10 mg by mouth daily.   Yes Historical Provider, MD  ferrous sulfate 325 (65 FE) MG tablet Take 325 mg by mouth daily.   Yes Historical Provider, MD  losartan (COZAAR) 50 MG tablet Take 50 mg by mouth daily. 12/09/14  Yes Historical Provider, MD  metoprolol (LOPRESSOR) 50 MG tablet Take 100 mg by mouth daily. 01/19/15   Yes Historical Provider, MD   Physical Exam: Filed Vitals:   01/27/15 2109 01/27/15 2210 01/28/15 0505 01/28/15 0749  BP: 108/54  126/76 158/86  Pulse: 85  69 78  Temp: 102.8 F (39.3 C) 99.9 F (37.7 C) 98.6 F (37 C) 97.9 F (36.6 C)  TempSrc: Oral Oral Oral Oral  Resp: 18  18 20   Height:      Weight:      SpO2: 91%  98% 99%    Wt Readings from Last 3 Encounters:  01/22/15 57.267 kg (126 lb 4 oz)    General:  Appears calm and comfortable Eyes: PERRL, normal lids, irises & conjunctiva ENT: grossly normal hearing, lips & tongue Neck: no LAD, masses or thyromegaly Cardiovascular: RRR, no m/r/g. No LE edema. Respiratory: few ronchi. Normal respiratory effort.          Labs on Admission:  Basic Metabolic Panel:  Recent Labs Lab 01/22/15 0604 01/23/15 0424 01/24/15 0616 01/27/15 0915  NA 134* 134* 137  --   K 3.7 3.9 3.5  --   CL 100* 101 105  --   CO2 23 24 24   --   GLUCOSE 106* 100* 99  --   BUN 15 16 9   --   CREATININE 1.05* 1.11* 0.98 0.79  CALCIUM 9.2 8.4* 8.3*  --  Liver Function Tests: No results for input(s): AST, ALT, ALKPHOS, BILITOT, PROT, ALBUMIN in the last 168 hours. No results for input(s): LIPASE, AMYLASE in the last 168 hours. No results for input(s): AMMONIA in the last 168 hours. CBC:  Recent Labs Lab 01/22/15 0604 01/23/15 0424 01/24/15 0616 01/28/15 1016  WBC 12.2* 12.0* 8.5 8.2  NEUTROABS  --   --   --  7.3*  HGB 11.3* 10.3* 9.0* 9.2*  HCT 34.0* 31.7* 27.4* 28.3*  MCV 94.5 94.7 95.3 94.7  PLT 272 228 222 349   Cardiac Enzymes:  Recent Labs Lab 01/22/15 0604  TROPONINI <0.03    BNP (last 3 results) No results for input(s): BNP in the last 8760 hours.  ProBNP (last 3 results) No results for input(s): PROBNP in the last 8760 hours.  CBG: No results for input(s): GLUCAP in the last 168 hours.  Radiological Exams on Admission: Dg Chest 2 View  01/27/2015   CLINICAL DATA:  Shortness of breath and cough, followup   EXAM: CHEST  2 VIEW  COMPARISON:  CT chest of 01/22/2015 and chest x-ray of 01/22/2015  FINDINGS: There does appear to have been some interval worsening of the consolidation of a portion of the right upper lobe with areas of cavitation suggesting necrotic pneumonia. Vague opacity at the left costophrenic angle may represent scarring or patchy pneumonia as well. No definite effusion is seen. Heart size is normal. No bony abnormality is seen.  IMPRESSION: Perhaps slight worsening of right upper lobe opacity most consistent with necrotic pneumonia with areas of apparent cavitation.   Electronically Signed   By: Ivar Drape M.D.   On: 01/27/2015 08:05   Ct Chest W Contrast  01/28/2015   CLINICAL DATA:  Followup right upper lobe lung process.  EXAM: CT CHEST WITH CONTRAST  TECHNIQUE: Multidetector CT imaging of the chest was performed during intravenous contrast administration.  CONTRAST:  20mL OMNIPAQUE IOHEXOL 300 MG/ML  SOLN  COMPARISON:  CT scans 01/22/2015  FINDINGS: Chest wall: No breast masses, supraclavicular or axillary lymphadenopathy. The thyroid gland appears normal. The bony thorax is intact. No destructive bone lesions or spinal canal compromise.  Mediastinum: The heart is normal in size. No pericardial effusion. Stable advanced atherosclerotic calcifications involving the aorta and branch vessels including the coronary arteries. The esophagus is grossly normal.  Stable small mediastinal lymph nodes. The right hilar node has enlarged slightly. It measures 20 x 21 mm on image number 25.  Lungs/pleura: There is a persistent and progressive necrotic appearing right upper lobe pneumonia. It previously measured a maximum of 7.3 x 2.8 cm and now measures 10.7 x 5.8 cm. Small abscesses are suspected. I do not see the discrete rim enhancing small abscess opposed demonstrated on the prior scan. There is also severe surrounding inflammation in the right lung with marked interstitial thickening and ground-glass  opacity. I do not see a discrete mass or endobronchial lesion to suggest neoplasm.  There are numerous small bilateral pulmonary nodules which are unchanged. The nodular density at the left lung base adjacent to the major fissure in the left lower lobe measures approximately 11 mm. Recommend continued observation.  Upper abdomen:  No significant findings.  IMPRESSION: 1. Worsening necrotic appearing right upper lobe pneumonia with possible small abscesses. Surrounding pneumonitis is also progressive. 2. Slight interval enlargement of right hilar lymph nodes. 3. Stable bilateral pulmonary nodules. 4. Bronchoscopy may be helpful for further evaluation. Repeat chest CT after appropriate antibiotic therapy is recommended.  Electronically Signed   By: Marijo Sanes M.D.   On: 01/28/2015 10:29    EKG: Independently reviewed.  Assessment/Plan Active Problems:   Necrotizing pneumonia   1. Necrotizing pneumonia -clinically had a fever yesterday and CT shows some worsening -would suggest continuing with IV antibiotics consider adding clindamycin -I will plan of Bronchoscopy next week  I have discussed the case with Dr Posey Pronto   Time spent: 26min    I have personally obtained a history, examined the patient, evaluated laboratory and imaging results, formulated the assessment and plan and placed orders.  The Patient requires high complexity decision making for assessment and support.    Allyne Gee, MD [facility] Pulmonary Critical Care Medicine Sleep Medicine

## 2015-01-28 NOTE — Plan of Care (Signed)
Problem: Discharge Progression Outcomes Goal: Discharge plan in place and appropriate Individualization  Patient prefers to be called [name], from home.       Current smoker.       Hx: COPD, HTN, MI, PVD - controlled with home medications.       Moderate Fall Risk. Pt shows understanding how to utilize call system for assistance.    Goal: Other Discharge Outcomes/Goals Plan of care progress to goals: 1. Complaints of chest pain related to with productive cough. Robitussin/codeine syrup & lozenges given for cough x1 with improvement noted     2. Hemodynamically     - Febrile (102.8, decreased to 99.9 after PO Tylenol).      -Remains on IV Vancomycin & Augmentin PO.     -IVFs continued 4. -No s/s of complications.    5. -Tolerating 2-gram sodium diet.    6. -Activity: Independent. No fall or injuries noted this shift. Will continue to assess.

## 2015-01-28 NOTE — Progress Notes (Signed)
ANTIBIOTIC CONSULT NOTE - FOLLOW UP  Pharmacy Consult for Vancomycin Indication: pneumonia  Allergies  Allergen Reactions  . Aspirin Nausea And Vomiting and Other (See Comments)    Pt states aspirin makes her cramp and have to use coated kind.    Patient Measurements: Height: 5\' 4"  (162.6 cm) Weight: 126 lb 4 oz (57.267 kg) IBW/kg (Calculated) : 54.7   Vital Signs: Temp: 97.9 F (36.6 C) (05/27 0749) Temp Source: Oral (05/27 0749) BP: 158/86 mmHg (05/27 0749) Pulse Rate: 78 (05/27 0749)  Labs:  Recent Labs  01/27/15 0915 01/28/15 1016  WBC  --  8.2  HGB  --  9.2*  PLT  --  349  CREATININE 0.79  --    Estimated Creatinine Clearance: 65.4 mL/min (by C-G formula based on Cr of 0.79).  Recent Labs  01/26/15 0456 01/28/15 0650  VANCOTROUGH 7* 14     Microbiology: Recent Results (from the past 720 hour(s))  Culture, blood (routine x 2)     Status: None   Collection Time: 01/22/15  9:02 AM  Result Value Ref Range Status   Specimen Description BLOOD  Final   Special Requests NONE  Final   Culture NO GROWTH 5 DAYS  Final   Report Status 01/27/2015 FINAL  Final  Culture, blood (routine x 2)     Status: None   Collection Time: 01/22/15  9:02 AM  Result Value Ref Range Status   Specimen Description BLOOD  Final   Special Requests NONE  Final   Culture NO GROWTH 5 DAYS  Final   Report Status 01/27/2015 FINAL  Final  Culture, sputum-assessment     Status: None   Collection Time: 01/25/15  2:47 AM  Result Value Ref Range Status   Specimen Description SPU  Final   Special Requests NONE  Final   Sputum evaluation   Final    THIS SPECIMEN IS ACCEPTABLE FOR SPUTUM CULTURE CTJ 01/25/15    Report Status 01/25/2015 FINAL  Final  Culture, respiratory (NON-Expectorated)     Status: None (Preliminary result)   Collection Time: 01/25/15  2:47 AM  Result Value Ref Range Status   Specimen Description SPU  Final   Special Requests NONE Reflexed from [id number]  Final   Gram  Stain PENDING  Incomplete   Culture APPEARS TO NORMAL FLORA PRESENT  Final   Report Status PENDING  Incomplete    Medical History: Past Medical History  Diagnosis Date  . 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negative cardiac cath  . COPD (chronic obstructive pulmonary disease)   . Hypertension   . Peripheral vascular disease   . Nicotine addiction   . Asthma     Medications:  Anti-infectives    Start     Dose/Rate Route Frequency Ordered Stop   01/28/15 1600  clindamycin (CLEOCIN) IVPB 600 mg     600 mg 100 mL/hr over 30 Minutes Intravenous Every 6 hours 01/28/15 1325     01/28/15 1430  vancomycin (VANCOCIN) 1,250 mg in sodium chloride 0.9 % 250 mL IVPB  Status:  Discontinued     1,250 mg 166.7 mL/hr over 90 Minutes Intravenous Every 12 hours 01/28/15 1352 01/28/15 1354   01/28/15 1430  vancomycin (VANCOCIN) 1,500 mg in sodium chloride 0.9 % 500 mL IVPB     1,500 mg 250 mL/hr over 120 Minutes Intravenous Every 12 hours 01/28/15 1354     01/28/15 1330  piperacillin-tazobactam (ZOSYN) IVPB 3.375 g     3.375 g 12.5  mL/hr over 240 Minutes Intravenous 4 times per day 01/28/15 1325     01/27/15 2200  amoxicillin-clavulanate (AUGMENTIN) 875-125 MG per tablet 1 tablet  Status:  Discontinued     1 tablet Oral Every 12 hours 01/27/15 1702 01/28/15 1324   01/26/15 0630  vancomycin (VANCOCIN) 1,250 mg in sodium chloride 0.9 % 250 mL IVPB  Status:  Discontinued     1,250 mg 166.7 mL/hr over 90 Minutes Intravenous Every 12 hours 01/26/15 0603 01/28/15 0906   01/22/15 1600  piperacillin-tazobactam (ZOSYN) IVPB 3.375 g  Status:  Discontinued     3.375 g 12.5 mL/hr over 240 Minutes Intravenous 3 times per day 01/22/15 1352 01/27/15 1702   01/22/15 1430  azithromycin (ZITHROMAX) 500 mg in dextrose 5 % 250 mL IVPB     500 mg 250 mL/hr over 60 Minutes Intravenous  Once 01/22/15 1351 01/22/15 1606   01/22/15 1400  piperacillin-tazobactam (ZOSYN) IVPB 3.375 g  Status:  Discontinued     3.375 g 12.5 mL/hr  over 240 Minutes Intravenous 3 times per day 01/22/15 0951 01/22/15 1352   01/22/15 1200  vancomycin (VANCOCIN) IVPB 1000 mg/200 mL premix  Status:  Discontinued     1,000 mg 200 mL/hr over 60 Minutes Intravenous Every 18 hours 01/22/15 1124 01/26/15 0603   01/22/15 1000  vancomycin (VANCOCIN) IVPB 1000 mg/200 mL premix  Status:  Discontinued     1,000 mg 200 mL/hr over 60 Minutes Intravenous Every 24 hours 01/22/15 0951 01/22/15 1124   01/22/15 0845  cefTRIAXone (ROCEPHIN) 1 g in dextrose 5 % 50 mL IVPB     1 g 100 mL/hr over 30 Minutes Intravenous  Once 01/22/15 0843 01/22/15 0931   01/22/15 0845  azithromycin (ZITHROMAX) 500 mg in dextrose 5 % 250 mL IVPB  Status:  Discontinued     500 mg 250 mL/hr over 60 Minutes Intravenous  Once 01/22/15 0843 01/22/15 1351     Assessment: 59 YO female currently ordered vancomycin, zosyn, and clindamycin for treatment of necrotizing pneumonia. ID following.  Ke 0.046 h^-1    T1/2 15.07 h    Vd 43.2 L 5/25 Vanc trough: 7 5/27 Vanc trough: 14  Goal of Therapy:  Vancomycin trough level 15-20 mcg/ml  Plan: 5/26 : Vanc trough @ 18:00 = 53 mcg/mL .  Spoke with lab , we believe this result to be in error (vanc trough was probably drawn as vancomycin was infusing).  Will repeat Vanc trough on 5/27 @ 6:00 = 14. Will increase dose to 1500mg  IV q12h to start today at 1430. Dose not given this morning at 0600 as vancomycin was discontinued per MD.  Will order follow-up trough prior to dose on 5/29 @0200 .  Pharmacy will continue to follow.  Joen Laura 01/28/2015,1:55 PM

## 2015-01-29 MED ORDER — ENSURE ENLIVE PO LIQD
237.0000 mL | Freq: Three times a day (TID) | ORAL | Status: DC
  Administered 2015-01-29 – 2015-02-04 (×15): 237 mL via ORAL

## 2015-01-29 NOTE — Progress Notes (Signed)
[place] [facility]                                                                                                                                                                                            Patient Demographics   [name], is a 59 y.o. female, DOB - [birth year], SMO:[id number]  Admit date - 01/22/2015   Admitting Physician Dustin Flock, MD  Outpatient Primary MD for the patient is No primary care provider on file.  LOS - 7  Chief Complaint  Patient presents with  . Pleurisy   patient feels better today, breathing improved. Low-grade temperature overnight    Review of Systems:   CONSTITUTIONAL: Positive fever. No fatigue, weakness. No weight gain, no weight loss.  EYES: No blurry or double vision.  ENT: No tinnitus. No postnasal drip. No redness of the oropharynx.  RESPIRATORY: Dry cough, no wheeze, no hemoptysis. Mild dyspnea.  CARDIOVASCULAR: Right-sided chest pain. No orthopnea. No palpitations. No syncope.  GASTROINTESTINAL: No nausea, no vomiting or diarrhea. No abdominal pain. No melena or hematochezia.  GENITOURINARY: No dysuria or hematuria.  ENDOCRINE: No polyuria or nocturia. No heat or cold intolerance.  HEMATOLOGY: No anemia. No bruising. No bleeding.  INTEGUMENTARY: No rashes. No lesions.  MUSCULOSKELETAL: No arthritis. No swelling. No gout.  NEUROLOGIC: No numbness, tingling, or ataxia. No seizure-type activity.  PSYCHIATRIC: No anxiety. No insomnia. No ADD.    Vitals:   Filed Vitals:   01/28/15 1439 01/28/15 2114 01/29/15 0510 01/29/15 1001  BP: 123/65 162/73 139/56 128/65  Pulse: 66 78 76 77  Temp: 99.7 F (37.6 C) 99.1 F (37.3 C) 98.9 F (37.2 C) 98.9 F (37.2 C)  TempSrc: Oral Oral Oral Oral  Resp: 18 20 18 18   Height:      Weight:      SpO2: 95% 98% 93% 95%    Wt Readings from Last 3 Encounters:  01/22/15 57.267 kg (126 lb 4 oz)     Intake/Output Summary (Last 24 hours) at  01/29/15 1200 Last data filed at 01/29/15 0916  Gross per 24 hour  Intake 783.75 ml  Output   2100 ml  Net -1316.25 ml    Physical Exam:   GENERAL: Pleasant-appearing in no apparent distress.  HEAD, EYES, EARS, NOSE AND THROAT: Atraumatic, normocephalic. Extraocular muscles are intact. Pupils equal and reactive to light. Sclerae anicteric. No conjunctival injection. No oro-pharyngeal erythema.  NECK: Supple. There is no jugular venous distention. No bruits, no lymphadenopathy, no thyromegaly.  HEART: Regular rate and rhythm, tachycardic. No murmurs, no rubs, no clicks.  LUNGS: Occasional rhonchi. No  rales or  No wheezes.  ABDOMEN: Soft, flat, nontender, nondistended. Has good bowel sounds. No hepatosplenomegaly appreciated.  EXTREMITIES: No evidence of any cyanosis, clubbing, or peripheral edema.  +2 pedal and radial pulses bilaterally.  NEUROLOGIC: The patient is alert, awake, and oriented x3 with no focal motor or sensory deficits appreciated bilaterally.  SKIN: Moist and warm with no rashes appreciated.  Psych: Not anxious, depressed LN: No inguinal LN enlargement    Antibiotics   Anti-infectives    Start     Dose/Rate Route Frequency Ordered Stop   01/28/15 2200  piperacillin-tazobactam (ZOSYN) IVPB 3.375 g     3.375 g 12.5 mL/hr over 240 Minutes Intravenous 3 times per day 01/28/15 1401     01/28/15 1600  clindamycin (CLEOCIN) IVPB 600 mg     600 mg 100 mL/hr over 30 Minutes Intravenous Every 6 hours 01/28/15 1325     01/28/15 1430  vancomycin (VANCOCIN) 1,250 mg in sodium chloride 0.9 % 250 mL IVPB  Status:  Discontinued     1,250 mg 166.7 mL/hr over 90 Minutes Intravenous Every 12 hours 01/28/15 1352 01/28/15 1354   01/28/15 1430  vancomycin (VANCOCIN) 1,500 mg in sodium chloride 0.9 % 500 mL IVPB     1,500 mg 250 mL/hr over 120 Minutes Intravenous Every 12 hours 01/28/15 1354     01/28/15 1330  piperacillin-tazobactam (ZOSYN) IVPB 3.375 g  Status:  Discontinued      3.375 g 12.5 mL/hr over 240 Minutes Intravenous 4 times per day 01/28/15 1325 01/28/15 1401   01/27/15 2200  amoxicillin-clavulanate (AUGMENTIN) 875-125 MG per tablet 1 tablet  Status:  Discontinued     1 tablet Oral Every 12 hours 01/27/15 1702 01/28/15 1324   01/26/15 0630  vancomycin (VANCOCIN) 1,250 mg in sodium chloride 0.9 % 250 mL IVPB  Status:  Discontinued     1,250 mg 166.7 mL/hr over 90 Minutes Intravenous Every 12 hours 01/26/15 0603 01/28/15 0906   01/22/15 1600  piperacillin-tazobactam (ZOSYN) IVPB 3.375 g  Status:  Discontinued     3.375 g 12.5 mL/hr over 240 Minutes Intravenous 3 times per day 01/22/15 1352 01/27/15 1702   01/22/15 1430  azithromycin (ZITHROMAX) 500 mg in dextrose 5 % 250 mL IVPB     500 mg 250 mL/hr over 60 Minutes Intravenous  Once 01/22/15 1351 01/22/15 1606   01/22/15 1400  piperacillin-tazobactam (ZOSYN) IVPB 3.375 g  Status:  Discontinued     3.375 g 12.5 mL/hr over 240 Minutes Intravenous 3 times per day 01/22/15 0951 01/22/15 1352   01/22/15 1200  vancomycin (VANCOCIN) IVPB 1000 mg/200 mL premix  Status:  Discontinued     1,000 mg 200 mL/hr over 60 Minutes Intravenous Every 18 hours 01/22/15 1124 01/26/15 0603   01/22/15 1000  vancomycin (VANCOCIN) IVPB 1000 mg/200 mL premix  Status:  Discontinued     1,000 mg 200 mL/hr over 60 Minutes Intravenous Every 24 hours 01/22/15 0951 01/22/15 1124   01/22/15 0845  cefTRIAXone (ROCEPHIN) 1 g in dextrose 5 % 50 mL IVPB     1 g 100 mL/hr over 30 Minutes Intravenous  Once 01/22/15 0843 01/22/15 0931   01/22/15 0845  azithromycin (ZITHROMAX) 500 mg in dextrose 5 % 250 mL IVPB  Status:  Discontinued     500 mg 250 mL/hr over 60 Minutes Intravenous  Once 01/22/15 0843 01/22/15 1351      Medications   Scheduled Meds: . aspirin EC  325 mg Oral Daily  . atorvastatin  80 mg Oral BH-[id number]  . clindamycin (CLEOCIN) IV  600 mg Intravenous Q6H  . enoxaparin (LOVENOX) injection  40 mg Subcutaneous Q24H  . ferrous  sulfate  325 mg Oral Daily  . guaiFENesin  600 mg Oral BID  . losartan  50 mg Oral Daily  . metoprolol  100 mg Oral Daily  . piperacillin-tazobactam (ZOSYN)  IV  3.375 g Intravenous 3 times per day  . polyethylene glycol  17 g Oral Daily  . vancomycin  1,500 mg Intravenous Q12H   Continuous Infusions: . sodium chloride 75 mL/hr at 01/29/15 0224   PRN Meds:.acetaminophen **OR** acetaminophen, guaiFENesin-codeine, HYDROcodone-acetaminophen, lactulose, menthol-cetylpyridinium, morphine injection, nicotine polacrilex, ondansetron **OR** ondansetron (ZOFRAN) IV, senna-docusate, zolpidem   Data Review:   Micro Results Recent Results (from the past 240 hour(s))  Culture, blood (routine x 2)     Status: None   Collection Time: 01/22/15  9:02 AM  Result Value Ref Range Status   Specimen Description BLOOD  Final   Special Requests NONE  Final   Culture NO GROWTH 5 DAYS  Final   Report Status 01/27/2015 FINAL  Final  Culture, blood (routine x 2)     Status: None   Collection Time: 01/22/15  9:02 AM  Result Value Ref Range Status   Specimen Description BLOOD  Final   Special Requests NONE  Final   Culture NO GROWTH 5 DAYS  Final   Report Status 01/27/2015 FINAL  Final  Culture, sputum-assessment     Status: None   Collection Time: 01/25/15  2:47 AM  Result Value Ref Range Status   Specimen Description SPU  Final   Special Requests NONE  Final   Sputum evaluation   Final    THIS SPECIMEN IS ACCEPTABLE FOR SPUTUM CULTURE CTJ 01/25/15    Report Status 01/25/2015 FINAL  Final  Culture, respiratory (NON-Expectorated)     Status: None (Preliminary result)   Collection Time: 01/25/15  2:47 AM  Result Value Ref Range Status   Specimen Description SPU  Final   Special Requests NONE Reflexed from [id number]  Final   Gram Stain PENDING  Incomplete   Culture APPEARS TO NORMAL FLORA PRESENT  Final   Report Status PENDING  Incomplete    Radiology Reports Dg Chest 2 View  01/27/2015   CLINICAL  DATA:  Shortness of breath and cough, followup  EXAM: CHEST  2 VIEW  COMPARISON:  CT chest of 01/22/2015 and chest x-ray of 01/22/2015  FINDINGS: There does appear to have been some interval worsening of the consolidation of a portion of the right upper lobe with areas of cavitation suggesting necrotic pneumonia. Vague opacity at the left costophrenic angle may represent scarring or patchy pneumonia as well. No definite effusion is seen. Heart size is normal. No bony abnormality is seen.  IMPRESSION: Perhaps slight worsening of right upper lobe opacity most consistent with necrotic pneumonia with areas of apparent cavitation.   Electronically Signed   By: Ivar Drape M.D.   On: 01/27/2015 08:05   Dg Chest 2 View  01/22/2015   CLINICAL DATA:  Pleurisy. Right-sided pain, back pain for 1 week. History of COPD.  EXAM: CHEST  2 VIEW  COMPARISON:  10/15/2013  FINDINGS: Within the posterior right upper lobe is a a 4.7 cm rounded masslike opacity, with surrounding ill-defined density. Additional ill-defined density in the right middle lobe. Underlying emphysema again seen. Probable atelectasis or scarring at the left costophrenic angle. The heart size and mediastinal contours are  normal, mild atherosclerosis of the thoracic aorta. No pleural effusion. No pneumothorax. There is degenerative change in the spine.  IMPRESSION: Rounded 4.7 since masslike opacity in the right upper lobe, with surrounding ill-defined density. This may reflect pneumonia versus neoplasm. There is additional opacity in the right middle lobe concerning for pneumonia.  Chest CT correlation versus radiographic follow-up recommended. While traditional follow-up for pneumonia would be in 3-4 weeks after course of antibiotics, given the masslike appearance, shorter interval follow-up is recommended in 1-2 weeks.   Electronically Signed   By: Jeb Levering M.D.   On: 01/22/2015 06:42   Ct Chest Wo Contrast  01/22/2015   CLINICAL DATA:  Five day  history of right-sided chest pain, worsening over the past 2 days. Right upper lobe lung mass on earlier chest x-ray.  EXAM: CT CHEST WITHOUT CONTRAST  TECHNIQUE: Multidetector CT imaging of the chest was performed following the standard protocol without IV contrast.  COMPARISON:  CT chest 10/14/2012. Two-view chest x-ray earlier same date.  FINDINGS: Lack of intravenous contrast makes the examination less sensitive than it would be otherwise. Severe emphysematous changes throughout both lungs with dense masslike opacity in the right upper lobe. Gas bubbles within the opacity. Extensive bullous changes are present in the right upper lobe in the area of the opacity. Patchy airspace opacities are present more inferiorly in the right lower lobe. Minimal patchy airspace opacities anteriorly in the right middle lobe. Masslike pleural-based opacity in the inferior left lower lobe measuring approximately 1.8 x 2.1 x 2.0 cm.  Multiple bilateral lung nodules, many of which are new since the prior CT. The largest nodule is in the inferior left upper lobe and measures approximately 5 mm (series 3, image 30).  No associated pleural effusions.  No pleural masses.  Normal heart size with extensive 3 vessel coronary atherosclerosis. Severe atherosclerosis involving the thoracic and upper abdominal aorta and their visualized branches without evidence of aneurysm. No visible mediastinal, hilar or axillary lymphadenopathy. Visualized thyroid gland unremarkable.  Visualized upper abdomen unremarkable. 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  IMPRESSION: 1. 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 2. Approximate 2 cm mass in the inferior left lower lobe. 3. Numerous bilateral pulmonary nodules, the largest approximating 5 mm in the left lower lobe. 4. Severe COPD/emphysema. 5. No  visible mediastinal, hilar or axillary lymphadenopathy, allowing for the unenhanced technique.   Electronically Signed   By: Evangeline Dakin M.D.   On: 01/22/2015 08:29   Ct Chest W Contrast  01/28/2015   CLINICAL DATA:  Followup right upper lobe lung process.  EXAM: CT CHEST WITH CONTRAST  TECHNIQUE: Multidetector CT imaging of the chest was performed during intravenous contrast administration.  CONTRAST:  49mL OMNIPAQUE IOHEXOL 300 MG/ML  SOLN  COMPARISON:  CT scans 01/22/2015  FINDINGS: Chest wall: No breast masses, supraclavicular or axillary lymphadenopathy. The thyroid gland appears normal. The bony thorax is intact. No destructive bone lesions or spinal canal compromise.  Mediastinum: The heart is normal in size. No pericardial effusion. Stable advanced atherosclerotic calcifications involving the aorta and branch vessels including the coronary arteries. The esophagus is grossly normal.  Stable small mediastinal lymph nodes. The right hilar node has enlarged slightly. It measures 20 x 21 mm on image number 25.  Lungs/pleura: There is a persistent and progressive necrotic appearing right upper lobe pneumonia. It previously measured a maximum of  7.3 x 2.8 cm and now measures 10.7 x 5.8 cm. Small abscesses are suspected. I do not see the discrete rim enhancing small abscess opposed demonstrated on the prior scan. There is also severe surrounding inflammation in the right lung with marked interstitial thickening and ground-glass opacity. I do not see a discrete mass or endobronchial lesion to suggest neoplasm.  There are numerous small bilateral pulmonary nodules which are unchanged. The nodular density at the left lung base adjacent to the major fissure in the left lower lobe measures approximately 11 mm. Recommend continued observation.  Upper abdomen:  No significant findings.  IMPRESSION: 1. Worsening necrotic appearing right upper lobe pneumonia with possible small abscesses. Surrounding pneumonitis is  also progressive. 2. Slight interval enlargement of right hilar lymph nodes. 3. Stable bilateral pulmonary nodules. 4. Bronchoscopy may be helpful for further evaluation. Repeat chest CT after appropriate antibiotic therapy is recommended.   Electronically Signed   By: Marijo Sanes M.D.   On: 01/28/2015 10:29   Ct Chest W Contrast  01/22/2015   CLINICAL DATA:  Right upper lobe lung mass with productive cough and chest pain.  EXAM: CT CHEST, ABDOMEN, AND PELVIS WITH CONTRAST  TECHNIQUE: Multidetector CT imaging of the chest, abdomen and pelvis was performed following the standard protocol during bolus administration of intravenous contrast.  CONTRAST:  171mL OMNIPAQUE IOHEXOL 300 MG/ML  SOLN  COMPARISON:  Unenhanced CT of the chest earlier today at 737 hours and prior CT on 10/14/2012.  FINDINGS: CT CHEST FINDINGS  Geographic area of peripheral consolidation in the lateral and posterior right upper lobe spans over a region measuring roughly 3.7 x 7.3 x 4.4 cm. Areas of gas formation are consistent with necrosis and there is a small posterior component of focal fluid attenuation measuring roughly 1.5 cm in diameter. Based on the contrast-enhanced CT, this may represent a necrotic pneumonia and is not necessarily representative of malignancy. The small area of liquefaction may represent early focal abscess formation. Additional ground-glass and alveolar opacity in the anterior right upper lobe and right middle lobe likely represent acute infection and extension of infiltrate. Clinical and imaging follow-up is recommended. If the process does not resolve, or worsens on antibiotic therapy, Pulmonary Medicine evaluation is recommended.  The area of mass-like opacity measured in the anterior aspect of the lateral left lower lobe is again visualized. This is also somewhat geographic in appearance and may not represent a focal mass. Focal atelectasis and scarring is possible. Malignancy is not completely excluded. A 6-7  mm anterior right upper lobe nodule is stable since 2014. A 5 mm anterior left upper lobe nodule is stable since 2014. A 5 mm inferior lingular nodule is stable since 2014. 4 mm subpleural nodule in the right middle lobe is stable since 2014. There are 2 tiny subpleural nodules in the posterior right lower lobe. One of these located more medially was not as apparent on the 2014 study. Tiny lateral subpleural nodules in the right lower lobe appears stable since the prior study in 2014.  No airway obstruction. Mildly prominent right hilar lymph node measures approximately 11 mm in short axis. There are a few tiny additional mediastinal nodes with no enlarged lymph nodes seen. No pleural or pericardial fluid is seen. The heart size is normal. Mild spondylosis present of the thoracic spine.  CT ABDOMEN AND PELVIS FINDINGS  Solid organs show no evidence of metastatic disease. The liver, pancreas, spleen, adrenal glands and kidneys are normal. There is a small  hiatal hernia. The gallbladder has been removed. No enlarged lymph nodes are seen.  Bowel is unremarkable and shows no evidence of obstruction or lesion. No free fluid, free air or abscess is identified. Small right inguinal hernia contains fat. The bladder is unremarkable. The uterus has been removed. The abdominal aorta and iliac arteries show atherosclerotic calcifications without aneurysmal disease or significant obstructive disease identified. No bony lesions are identified.  IMPRESSION: 1. The geographic area of peripheral consolidation in the right upper lobe has an appearance most likely consistent with necrotic pneumonia with additional small early abscess formation measuring 1.5 cm within this region. Although malignancy cannot be excluded, necrotic pneumonia is favored. Additional alveolar and ground-glass infiltrate extends into the anterior right upper lobe and right middle lobe. If the process does not resolve or worsens, pulmonary Medicine evaluation  is recommended. Associated 11 mm right hilar lymph node may be reactive or metastatic. 2. The peripheral lateral left lower lobe opacity may represent focal atelectasis and scarring. Malignancy cannot be excluded, however. Once the pneumonia of the right upper lobe has been adequately treated, if there is persistent opacity in the left lower lobe, PET scan evaluation may be helpful. 3. All of the subcentimeter small nodules identified bilaterally are largely peripheral/subpleural and are stable since the CT in 2014. One of the right lower lobe nodules may be new/more prominent but only measures a few mm in diameter. All of these tiny nodules are most likely postinflammatory and not representative of metastatic nodules. 4. No evidence of pathology in the abdomen or pelvis. No acute findings or evidence of metastatic disease. Incidental small hiatal hernia and small right inguinal hernia containing fat.   Electronically Signed   By: Aletta Edouard M.D.   On: 01/22/2015 13:45   Ct Abdomen Pelvis W Contrast  01/22/2015   CLINICAL DATA:  Right upper lobe lung mass with productive cough and chest pain.  EXAM: CT CHEST, ABDOMEN, AND PELVIS WITH CONTRAST  TECHNIQUE: Multidetector CT imaging of the chest, abdomen and pelvis was performed following the standard protocol during bolus administration of intravenous contrast.  CONTRAST:  160mL OMNIPAQUE IOHEXOL 300 MG/ML  SOLN  COMPARISON:  Unenhanced CT of the chest earlier today at 737 hours and prior CT on 10/14/2012.  FINDINGS: CT CHEST FINDINGS  Geographic area of peripheral consolidation in the lateral and posterior right upper lobe spans over a region measuring roughly 3.7 x 7.3 x 4.4 cm. Areas of gas formation are consistent with necrosis and there is a small posterior component of focal fluid attenuation measuring roughly 1.5 cm in diameter. Based on the contrast-enhanced CT, this may represent a necrotic pneumonia and is not necessarily representative of malignancy.  The small area of liquefaction may represent early focal abscess formation. Additional ground-glass and alveolar opacity in the anterior right upper lobe and right middle lobe likely represent acute infection and extension of infiltrate. Clinical and imaging follow-up is recommended. If the process does not resolve, or worsens on antibiotic therapy, Pulmonary Medicine evaluation is recommended.  The area of mass-like opacity measured in the anterior aspect of the lateral left lower lobe is again visualized. This is also somewhat geographic in appearance and may not represent a focal mass. Focal atelectasis and scarring is possible. Malignancy is not completely excluded. A 6-7 mm anterior right upper lobe nodule is stable since 2014. A 5 mm anterior left upper lobe nodule is stable since 2014. A 5 mm inferior lingular nodule is stable since 2014. 4 mm  subpleural nodule in the right middle lobe is stable since 2014. There are 2 tiny subpleural nodules in the posterior right lower lobe. One of these located more medially was not as apparent on the 2014 study. Tiny lateral subpleural nodules in the right lower lobe appears stable since the prior study in 2014.  No airway obstruction. Mildly prominent right hilar lymph node measures approximately 11 mm in short axis. There are a few tiny additional mediastinal nodes with no enlarged lymph nodes seen. No pleural or pericardial fluid is seen. The heart size is normal. Mild spondylosis present of the thoracic spine.  CT ABDOMEN AND PELVIS FINDINGS  Solid organs show no evidence of metastatic disease. The liver, pancreas, spleen, adrenal glands and kidneys are normal. There is a small hiatal hernia. The gallbladder has been removed. No enlarged lymph nodes are seen.  Bowel is unremarkable and shows no evidence of obstruction or lesion. No free fluid, free air or abscess is identified. Small right inguinal hernia contains fat. The bladder is unremarkable. The uterus has been  removed. The abdominal aorta and iliac arteries show atherosclerotic calcifications without aneurysmal disease or significant obstructive disease identified. No bony lesions are identified.  IMPRESSION: 1. The geographic area of peripheral consolidation in the right upper lobe has an appearance most likely consistent with necrotic pneumonia with additional small early abscess formation measuring 1.5 cm within this region. Although malignancy cannot be excluded, necrotic pneumonia is favored. Additional alveolar and ground-glass infiltrate extends into the anterior right upper lobe and right middle lobe. If the process does not resolve or worsens, pulmonary Medicine evaluation is recommended. Associated 11 mm right hilar lymph node may be reactive or metastatic. 2. The peripheral lateral left lower lobe opacity may represent focal atelectasis and scarring. Malignancy cannot be excluded, however. Once the pneumonia of the right upper lobe has been adequately treated, if there is persistent opacity in the left lower lobe, PET scan evaluation may be helpful. 3. All of the subcentimeter small nodules identified bilaterally are largely peripheral/subpleural and are stable since the CT in 2014. One of the right lower lobe nodules may be new/more prominent but only measures a few mm in diameter. All of these tiny nodules are most likely postinflammatory and not representative of metastatic nodules. 4. No evidence of pathology in the abdomen or pelvis. No acute findings or evidence of metastatic disease. Incidental small hiatal hernia and small right inguinal hernia containing fat.   Electronically Signed   By: Aletta Edouard M.D.   On: 01/22/2015 13:45     CBC  Recent Labs Lab 01/23/15 0424 01/24/15 0616 01/28/15 1016  WBC 12.0* 8.5 8.2  HGB 10.3* 9.0* 9.2*  HCT 31.7* 27.4* 28.3*  PLT 228 222 349  MCV 94.7 95.3 94.7  MCH 30.8 31.4 30.8  MCHC 32.5 32.9 32.6  RDW 14.8* 15.2* 15.8*  LYMPHSABS  --   --  0.6*   MONOABS  --   --  0.3  EOSABS  --   --  0.0  BASOSABS  --   --  0.0    Chemistries   Recent Labs Lab 01/23/15 0424 01/24/15 0616 01/27/15 0915  NA 134* 137  --   K 3.9 3.5  --   CL 101 105  --   CO2 24 24  --   GLUCOSE 100* 99  --   BUN 16 9  --   CREATININE 1.11* 0.98 0.79  CALCIUM 8.4* 8.3*  --    ------------------------------------------------------------------------------------------------------------------  estimated creatinine clearance is 65.4 mL/min (by C-G formula based on Cr of 0.79). ------------------------------------------------------------------------------------------------------------------ No results for input(s): HGBA1C in the last 72 hours. ------------------------------------------------------------------------------------------------------------------ No results for input(s): CHOL, HDL, LDLCALC, TRIG, CHOLHDL, LDLDIRECT in the last 72 hours. ------------------------------------------------------------------------------------------------------------------ No results for input(s): TSH, T4TOTAL, T3FREE, THYROIDAB in the last 72 hours.  Invalid input(s): FREET3 ------------------------------------------------------------------------------------------------------------------ No results for input(s): VITAMINB12, FOLATE, FERRITIN, TIBC, IRON, RETICCTPCT in the last 72 hours.  Coagulation profile No results for input(s): INR, PROTIME in the last 168 hours.  No results for input(s): DDIMER in the last 72 hours.  Cardiac Enzymes No results for input(s): CKMB, TROPONINI, MYOGLOBIN in the last 168 hours.  Invalid input(s): CK ------------------------------------------------------------------------------------------------------------------ Invalid input(s): POCBNP    Assessment & Plan   Active Problems:   Necrotizing pneumonia IMPRESSION AND PLAN: Patient is a 59 year old African-American female with history of nicotine addiction, COPD presents with  right-sided chest pain noted to have a mass as well as possible pneumonia.  #1.  Necorotizing pna-  ct worsening, recommends patient on vanc and zosyn and clindamycin. Plan for bronch next week  #2. COPD; there is no evidence of acute exacerbation continue her on Advair and Spiriva. And Combivent inhalers as needed.  #3. Peripheral vascular disease;   aspirin  #4. Nicotine addiction smoking cessation done     Code Status Orders        Start     Ordered   01/22/15 0947  Full code   Continuous     01/22/15 0948      Family Communication: Patient   Disposition Plan: Home   Procedures none   Consults pulmonary consult , ID consult   DVT Prophylaxis  SCDs  Lab Results  Component Value Date   PLT 349 01/28/2015     Time Spent in minutes 25 minutes   Dustin Flock M.D on 01/29/2015 at 12:00 PM  Between 7am to 6pm - Pager - [phone]  After 6pm go to [email or web address] - password [email or web address] [facility]   Office  [phone]

## 2015-01-29 NOTE — Progress Notes (Signed)
Initial Nutrition Assessment  DOCUMENTATION CODES:     INTERVENTION: Meals and Snacks: Cater to patient preferences Medical Food Supplement Therapy: will recommend Ensure Enlive (each supplement provides 350kcal and 20 grams of protein) on meal trays   NUTRITION DIAGNOSIS:  Inadequate oral intake related to acute illness as evidenced by meal completion < 25% this am  GOAL:  Patient will meet greater than or equal to 90% of their needs  MONITOR:   (Energy Intake, Electrolyte and Renal Profile, Pulmonary Profile)  REASON FOR ASSESSMENT:   (RD Screen Length of Stay)    ASSESSMENT:  Pt admitted with necrotizing pneumonia and SOB; bronch next week per MD note PMHx:  Past Medical History  Diagnosis Date  . 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negative cardiac cath  . COPD (chronic obstructive pulmonary disease)   . Hypertension   . Peripheral vascular disease   . Nicotine addiction   . Asthma    PO Intake: pt reports appetite is 'sometimes good and sometimes not.' Pt drank coffee this am for breakfast the rest of tray was untouched. Writer notes yesterday per I and O pt ate 100% of lunch. Pt reports appetite was the same PTA but has been stable.   Medications: Ferrous sulfate, NS at 81ml/hr Labs: Electrolyte and Renal Profile:    Recent Labs Lab 01/23/15 0424 01/24/15 0616 01/27/15 0915  BUN 16 9  --   CREATININE 1.11* 0.98 0.79  NA 134* 137  --   K 3.9 3.5  --    Glucose Profile: No results for input(s): GLUCAP in the last 72 hours. Protein Profile: No results for input(s): ALBUMIN in the last 168 hours.   Pt reports stable weight PTA  Height:  Ht Readings from Last 1 Encounters:  01/22/15 5\' 4"  (1.626 m)    Weight:  Wt Readings from Last 1 Encounters:  01/22/15 126 lb 4 oz (57.267 kg)    Ideal Body Weight:     Wt Readings from Last 10 Encounters:  01/22/15 126 lb 4 oz (57.267 kg)    BMI:  Body mass index is 21.66 kg/(m^2).  Skin:  Reviewed, no  issues  Diet Order:  Diet 2 gram sodium Room service appropriate?: Yes; Fluid consistency:: Thin  EDUCATION NEEDS:  No education needs identified at this time   Intake/Output Summary (Last 24 hours) at 01/29/15 1224 Last data filed at 01/29/15 0916  Gross per 24 hour  Intake 783.75 ml  Output   2100 ml  Net -1316.25 ml    Last BM:  5/27  LOW Care Level  Dwyane Luo, RD, LDN Pager [phone]

## 2015-01-29 NOTE — Plan of Care (Signed)
Problem: Discharge Progression Outcomes Goal: Discharge plan in place and appropriate Individualization  Outcome: Progressing Patient prefers to be called Brit, from home.        Current smoker.        Hx: COPD, HTN, MI, PVD - controlled with home medications.        Moderate Fall Risk. Pt shows understanding how to utilize call system for assistance. Goal: Other Discharge Outcomes/Goals Outcome: Progressing Plan of care progress to goals:  1. Productive cough noted. Robitussin/codeine syrup given for cough.            - Cepacol lozenges given for cough.     - Hydrocodone 1 tablet given for chest pain related to cough.     - Improvement noted.     2. Hemodynamically      - Febrile.       -Remains on IV antibiotics. Vancomycin, Zosyn and Cleocin.      -Remains on IVFs.    3. -No s/s of complications.     4. -Tolerating 2-gram sodium diet. Decreased appetite. Supplemental drink ordered. 5. -Activity: Independent.       No fall or injuries noted this shift.

## 2015-01-29 NOTE — Plan of Care (Signed)
Problem: Discharge Progression Outcomes Goal: Discharge plan in place and appropriate Individualization  Patient prefers to be called [name], from home.        Current smoker.        Hx: COPD, HTN, MI, PVD - controlled with home medications.        Moderate Fall Risk. Pt shows understanding how to utilize call system for assistance.     Goal: Other Discharge Outcomes/Goals Outcome: Progressing Plan of care progress to goals: Afebrile. O2 sats in the mid 90's on room air. Frequent cough and sore throat, prn meds given with improvement. Continue ABX, IVF.

## 2015-01-30 LAB — VANCOMYCIN, TROUGH
Vancomycin Tr: 34 ug/mL (ref 10–20)
Vancomycin Tr: 14 ug/mL (ref 10–20)

## 2015-01-30 LAB — CREATININE, SERUM
Creatinine, Ser: 1.15 mg/dL — ABNORMAL HIGH (ref 0.44–1.00)
GFR calc Af Amer: 59 mL/min — ABNORMAL LOW (ref >60)
GFR, EST NON AFRICAN AMERICAN: 51 mL/min — AB (ref >60)

## 2015-01-30 MED ORDER — CALCIUM CARBONATE ANTACID 500 MG PO CHEW
1.0000 | CHEWABLE_TABLET | Freq: Three times a day (TID) | ORAL | Status: DC | PRN
  Administered 2015-01-30 – 2015-02-01 (×2): 200 mg via ORAL
  Filled 2015-01-30 (×2): qty 1

## 2015-01-30 MED ORDER — ZOLPIDEM TARTRATE 5 MG PO TABS
5.0000 mg | ORAL_TABLET | Freq: Every day | ORAL | Status: DC
  Administered 2015-01-30 – 2015-02-03 (×4): 5 mg via ORAL
  Filled 2015-01-30 (×3): qty 1

## 2015-01-30 MED ORDER — VANCOMYCIN HCL 10 G IV SOLR
1250.0000 mg | INTRAVENOUS | Status: DC
  Administered 2015-01-30 – 2015-02-01 (×3): 1250 mg via INTRAVENOUS
  Filled 2015-01-30 (×4): qty 1250

## 2015-01-30 MED ORDER — VANCOMYCIN HCL 10 G IV SOLR
1250.0000 mg | INTRAVENOUS | Status: DC
  Filled 2015-01-30: qty 1250

## 2015-01-30 NOTE — Progress Notes (Signed)
ANTIBIOTIC CONSULT NOTE - FOLLOW UP  Pharmacy Consult for Vancomycin Indication: pneumonia  Allergies  Allergen Reactions  . Aspirin Nausea And Vomiting and Other (See Comments)    Pt states aspirin makes her cramp and have to use coated kind.    Patient Measurements: Height: 5\' 4"  (162.6 cm) Weight: 126 lb 4 oz (57.267 kg) IBW/kg (Calculated) : 54.7   Vital Signs: Temp: 98.1 F (36.7 C) (05/28 2124) Temp Source: Oral (05/28 2124) BP: 144/58 mmHg (05/28 2124) Pulse Rate: 70 (05/28 2124)  Labs:  Recent Labs  01/27/15 0915 01/28/15 1016  WBC  --  8.2  HGB  --  9.2*  PLT  --  349  CREATININE 0.79  --    Estimated Creatinine Clearance: 65.4 mL/min (by C-G formula based on Cr of 0.79).  Recent Labs  01/28/15 0650 01/30/15 0158  VANCOTROUGH 14 34*     Microbiology: Recent Results (from the past 720 hour(s))  Culture, blood (routine x 2)     Status: None   Collection Time: 01/22/15  9:02 AM  Result Value Ref Range Status   Specimen Description BLOOD  Final   Special Requests NONE  Final   Culture NO GROWTH 5 DAYS  Final   Report Status 01/27/2015 FINAL  Final  Culture, blood (routine x 2)     Status: None   Collection Time: 01/22/15  9:02 AM  Result Value Ref Range Status   Specimen Description BLOOD  Final   Special Requests NONE  Final   Culture NO GROWTH 5 DAYS  Final   Report Status 01/27/2015 FINAL  Final  Culture, sputum-assessment     Status: None   Collection Time: 01/25/15  2:47 AM  Result Value Ref Range Status   Specimen Description SPU  Final   Special Requests NONE  Final   Sputum evaluation   Final    THIS SPECIMEN IS ACCEPTABLE FOR SPUTUM CULTURE CTJ 01/25/15    Report Status 01/25/2015 FINAL  Final  Culture, respiratory (NON-Expectorated)     Status: None (Preliminary result)   Collection Time: 01/25/15  2:47 AM  Result Value Ref Range Status   Specimen Description SPU  Final   Special Requests NONE Reflexed from [id number]  Final   Gram  Stain PENDING  Incomplete   Culture APPEARS TO NORMAL FLORA PRESENT  Final   Report Status PENDING  Incomplete    Medical History: Past Medical History  Diagnosis Date  . 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negative cardiac cath  . COPD (chronic obstructive pulmonary disease)   . Hypertension   . Peripheral vascular disease   . Nicotine addiction   . Asthma     Medications:  Anti-infectives    Start     Dose/Rate Route Frequency Ordered Stop   01/28/15 2200  piperacillin-tazobactam (ZOSYN) IVPB 3.375 g     3.375 g 12.5 mL/hr over 240 Minutes Intravenous 3 times per day 01/28/15 1401     01/28/15 1600  clindamycin (CLEOCIN) IVPB 600 mg     600 mg 100 mL/hr over 30 Minutes Intravenous Every 6 hours 01/28/15 1325     01/28/15 1430  vancomycin (VANCOCIN) 1,250 mg in sodium chloride 0.9 % 250 mL IVPB  Status:  Discontinued     1,250 mg 166.7 mL/hr over 90 Minutes Intravenous Every 12 hours 01/28/15 1352 01/28/15 1354   01/28/15 1430  vancomycin (VANCOCIN) 1,500 mg in sodium chloride 0.9 % 500 mL IVPB     1,500 mg  250 mL/hr over 120 Minutes Intravenous Every 12 hours 01/28/15 1354     01/28/15 1330  piperacillin-tazobactam (ZOSYN) IVPB 3.375 g  Status:  Discontinued     3.375 g 12.5 mL/hr over 240 Minutes Intravenous 4 times per day 01/28/15 1325 01/28/15 1401   01/27/15 2200  amoxicillin-clavulanate (AUGMENTIN) 875-125 MG per tablet 1 tablet  Status:  Discontinued     1 tablet Oral Every 12 hours 01/27/15 1702 01/28/15 1324   01/26/15 0630  vancomycin (VANCOCIN) 1,250 mg in sodium chloride 0.9 % 250 mL IVPB  Status:  Discontinued     1,250 mg 166.7 mL/hr over 90 Minutes Intravenous Every 12 hours 01/26/15 0603 01/28/15 0906   01/22/15 1600  piperacillin-tazobactam (ZOSYN) IVPB 3.375 g  Status:  Discontinued     3.375 g 12.5 mL/hr over 240 Minutes Intravenous 3 times per day 01/22/15 1352 01/27/15 1702   01/22/15 1430  azithromycin (ZITHROMAX) 500 mg in dextrose 5 % 250 mL IVPB     500  mg 250 mL/hr over 60 Minutes Intravenous  Once 01/22/15 1351 01/22/15 1606   01/22/15 1400  piperacillin-tazobactam (ZOSYN) IVPB 3.375 g  Status:  Discontinued     3.375 g 12.5 mL/hr over 240 Minutes Intravenous 3 times per day 01/22/15 0951 01/22/15 1352   01/22/15 1200  vancomycin (VANCOCIN) IVPB 1000 mg/200 mL premix  Status:  Discontinued     1,000 mg 200 mL/hr over 60 Minutes Intravenous Every 18 hours 01/22/15 1124 01/26/15 0603   01/22/15 1000  vancomycin (VANCOCIN) IVPB 1000 mg/200 mL premix  Status:  Discontinued     1,000 mg 200 mL/hr over 60 Minutes Intravenous Every 24 hours 01/22/15 0951 01/22/15 1124   01/22/15 0845  cefTRIAXone (ROCEPHIN) 1 g in dextrose 5 % 50 mL IVPB     1 g 100 mL/hr over 30 Minutes Intravenous  Once 01/22/15 0843 01/22/15 0931   01/22/15 0845  azithromycin (ZITHROMAX) 500 mg in dextrose 5 % 250 mL IVPB  Status:  Discontinued     500 mg 250 mL/hr over 60 Minutes Intravenous  Once 01/22/15 0843 01/22/15 1351     Assessment: 59 YO female currently ordered vancomycin, zosyn, and clindamycin for treatment of necrotizing pneumonia. ID following.  Ke 0.046 h^-1    T1/2 15.07 h    Vd 43.2 L 5/25 Vanc trough: 7 5/27 Vanc trough: 14 5/29 Vanc trough: 34  Goal of Therapy:  Vancomycin trough level 15-20 mcg/ml  Plan: 5/26 : Vanc trough @ 18:00 = 53 mcg/mL .  Spoke with lab , we believe this result to be in error (vanc trough was probably drawn as vancomycin was infusing). Will repeat Vanc trough on 5/27 @ 6:00 = 14.  5/27 Will increase dose to 1500mg  IV q12h to start today at 1430. Dose not given this morning at 0600 as vancomycin was discontinued per MD. Will order follow-up trough prior to dose on 5/29 @0200 .  5/29 Called RN and informed her of elevated trough level and asked her to stop the infusion which had been running for ~ 25 minutes (about 1/4 dose infused). Will check another level in 12 hours to make sure patient is clearing the drug. Will  decrease to Vancomycin 1250mg  IV q18h if next level is within range.   Pharmacy will continue to follow.  Vira Blanco 01/30/2015,3:05 AM

## 2015-01-30 NOTE — Progress Notes (Signed)
[place] [facility]                                                                                                                                                                                            Patient Demographics   [name], is a 59 y.o. female, DOB - [id number], EXB:[id number]  Admit date - 01/22/2015   Admitting Physician Dustin Flock, MD  Outpatient Primary MD for the patient is No primary care provider on file.  LOS - 8  Chief Complaint  Patient presents with  . Pleurisy   was not able to sleep well last night, low-grade temperatures this morning    Review of Systems:   CONSTITUTIONAL: Positive fever. No fatigue, weakness. No weight gain, no weight loss.  EYES: No blurry or double vision.  ENT: No tinnitus. No postnasal drip. No redness of the oropharynx.  RESPIRATORY: Dry cough, no wheeze, no hemoptysis. Mild dyspnea.  CARDIOVASCULAR: Right-sided chest pain. No orthopnea. No palpitations. No syncope.  GASTROINTESTINAL: No nausea, no vomiting or diarrhea. No abdominal pain. No melena or hematochezia.  GENITOURINARY: No dysuria or hematuria.  ENDOCRINE: No polyuria or nocturia. No heat or cold intolerance.  HEMATOLOGY: No anemia. No bruising. No bleeding.  INTEGUMENTARY: No rashes. No lesions.  MUSCULOSKELETAL: No arthritis. No swelling. No gout.  NEUROLOGIC: No numbness, tingling, or ataxia. No seizure-type activity.  PSYCHIATRIC: No anxiety. No insomnia. No ADD.    Vitals:   Filed Vitals:   01/29/15 1344 01/29/15 2124 01/30/15 0516 01/30/15 0933  BP: 126/63 144/58 132/61 143/66  Pulse: 73 70 81 86  Temp: 99.8 F (37.7 C) 98.1 F (36.7 C) 100.1 F (37.8 C)   TempSrc: Oral Oral Oral   Resp:  18 16   Height:      Weight:      SpO2: 95% 95% 100% 98%    Wt Readings from Last 3 Encounters:  01/22/15 57.267 kg (126 lb 4 oz)     Intake/Output Summary (Last 24 hours) at 01/30/15 1107 Last data filed at  01/30/15 0943  Gross per 24 hour  Intake   1215 ml  Output    751 ml  Net    464 ml    Physical Exam:   GENERAL: Pleasant-appearing in no apparent distress.  HEAD, EYES, EARS, NOSE AND THROAT: Atraumatic, normocephalic. Extraocular muscles are intact. Pupils equal and reactive to light. Sclerae anicteric. No conjunctival injection. No oro-pharyngeal erythema.  NECK: Supple. There is no jugular venous distention. No bruits, no lymphadenopathy, no thyromegaly.  HEART: Regular rate and rhythm, tachycardic. No murmurs, no rubs, no  clicks.  LUNGS: Occasional rhonchi. No rales or  No wheezes.  ABDOMEN: Soft, flat, nontender, nondistended. Has good bowel sounds. No hepatosplenomegaly appreciated.  EXTREMITIES: No evidence of any cyanosis, clubbing, or peripheral edema.  +2 pedal and radial pulses bilaterally.  NEUROLOGIC: The patient is alert, awake, and oriented x3 with no focal motor or sensory deficits appreciated bilaterally.  SKIN: Moist and warm with no rashes appreciated.  Psych: Not anxious, depressed LN: No inguinal LN enlargement    Antibiotics   Anti-infectives    Start     Dose/Rate Route Frequency Ordered Stop   01/30/15 1600  vancomycin (VANCOCIN) 1,250 mg in sodium chloride 0.9 % 250 mL IVPB     1,250 mg 166.7 mL/hr over 90 Minutes Intravenous Every 18 hours 01/30/15 0346     01/30/15 0330  vancomycin (VANCOCIN) 1,250 mg in sodium chloride 0.9 % 250 mL IVPB  Status:  Discontinued     1,250 mg 166.7 mL/hr over 90 Minutes Intravenous Every 18 hours 01/30/15 0321 01/30/15 0346   01/28/15 2200  piperacillin-tazobactam (ZOSYN) IVPB 3.375 g     3.375 g 12.5 mL/hr over 240 Minutes Intravenous 3 times per day 01/28/15 1401     01/28/15 1600  clindamycin (CLEOCIN) IVPB 600 mg     600 mg 100 mL/hr over 30 Minutes Intravenous Every 6 hours 01/28/15 1325     01/28/15 1430  vancomycin (VANCOCIN) 1,250 mg in sodium chloride 0.9 % 250 mL IVPB  Status:  Discontinued     1,250 mg 166.7  mL/hr over 90 Minutes Intravenous Every 12 hours 01/28/15 1352 01/28/15 1354   01/28/15 1430  vancomycin (VANCOCIN) 1,500 mg in sodium chloride 0.9 % 500 mL IVPB  Status:  Discontinued     1,500 mg 250 mL/hr over 120 Minutes Intravenous Every 12 hours 01/28/15 1354 01/30/15 0321   01/28/15 1330  piperacillin-tazobactam (ZOSYN) IVPB 3.375 g  Status:  Discontinued     3.375 g 12.5 mL/hr over 240 Minutes Intravenous 4 times per day 01/28/15 1325 01/28/15 1401   01/27/15 2200  amoxicillin-clavulanate (AUGMENTIN) 875-125 MG per tablet 1 tablet  Status:  Discontinued     1 tablet Oral Every 12 hours 01/27/15 1702 01/28/15 1324   01/26/15 0630  vancomycin (VANCOCIN) 1,250 mg in sodium chloride 0.9 % 250 mL IVPB  Status:  Discontinued     1,250 mg 166.7 mL/hr over 90 Minutes Intravenous Every 12 hours 01/26/15 0603 01/28/15 0906   01/22/15 1600  piperacillin-tazobactam (ZOSYN) IVPB 3.375 g  Status:  Discontinued     3.375 g 12.5 mL/hr over 240 Minutes Intravenous 3 times per day 01/22/15 1352 01/27/15 1702   01/22/15 1430  azithromycin (ZITHROMAX) 500 mg in dextrose 5 % 250 mL IVPB     500 mg 250 mL/hr over 60 Minutes Intravenous  Once 01/22/15 1351 01/22/15 1606   01/22/15 1400  piperacillin-tazobactam (ZOSYN) IVPB 3.375 g  Status:  Discontinued     3.375 g 12.5 mL/hr over 240 Minutes Intravenous 3 times per day 01/22/15 0951 01/22/15 1352   01/22/15 1200  vancomycin (VANCOCIN) IVPB 1000 mg/200 mL premix  Status:  Discontinued     1,000 mg 200 mL/hr over 60 Minutes Intravenous Every 18 hours 01/22/15 1124 01/26/15 0603   01/22/15 1000  vancomycin (VANCOCIN) IVPB 1000 mg/200 mL premix  Status:  Discontinued     1,000 mg 200 mL/hr over 60 Minutes Intravenous Every 24 hours 01/22/15 0951 01/22/15 1124   01/22/15 0845  cefTRIAXone (ROCEPHIN)  1 g in dextrose 5 % 50 mL IVPB     1 g 100 mL/hr over 30 Minutes Intravenous  Once 01/22/15 0843 01/22/15 0931   01/22/15 0845  azithromycin (ZITHROMAX) 500  mg in dextrose 5 % 250 mL IVPB  Status:  Discontinued     500 mg 250 mL/hr over 60 Minutes Intravenous  Once 01/22/15 0843 01/22/15 1351      Medications   Scheduled Meds: . aspirin EC  325 mg Oral Daily  . atorvastatin  80 mg Oral BH-[id number]  . clindamycin (CLEOCIN) IV  600 mg Intravenous Q6H  . enoxaparin (LOVENOX) injection  40 mg Subcutaneous Q24H  . feeding supplement (ENSURE ENLIVE)  237 mL Oral TID WC  . ferrous sulfate  325 mg Oral Daily  . guaiFENesin  600 mg Oral BID  . losartan  50 mg Oral Daily  . metoprolol  100 mg Oral Daily  . piperacillin-tazobactam (ZOSYN)  IV  3.375 g Intravenous 3 times per day  . polyethylene glycol  17 g Oral Daily  . vancomycin  1,250 mg Intravenous Q18H  . zolpidem  5 mg Oral QHS   Continuous Infusions:   PRN Meds:.acetaminophen **OR** acetaminophen, guaiFENesin-codeine, HYDROcodone-acetaminophen, lactulose, menthol-cetylpyridinium, morphine injection, nicotine polacrilex, ondansetron **OR** ondansetron (ZOFRAN) IV, senna-docusate, zolpidem   Data Review:   Micro Results Recent Results (from the past 240 hour(s))  Culture, blood (routine x 2)     Status: None   Collection Time: 01/22/15  9:02 AM  Result Value Ref Range Status   Specimen Description BLOOD  Final   Special Requests NONE  Final   Culture NO GROWTH 5 DAYS  Final   Report Status 01/27/2015 FINAL  Final  Culture, blood (routine x 2)     Status: None   Collection Time: 01/22/15  9:02 AM  Result Value Ref Range Status   Specimen Description BLOOD  Final   Special Requests NONE  Final   Culture NO GROWTH 5 DAYS  Final   Report Status 01/27/2015 FINAL  Final  Culture, sputum-assessment     Status: None   Collection Time: 01/25/15  2:47 AM  Result Value Ref Range Status   Specimen Description SPU  Final   Special Requests NONE  Final   Sputum evaluation   Final    THIS SPECIMEN IS ACCEPTABLE FOR SPUTUM CULTURE CTJ 01/25/15    Report Status 01/25/2015 FINAL  Final   Culture, respiratory (NON-Expectorated)     Status: None (Preliminary result)   Collection Time: 01/25/15  2:47 AM  Result Value Ref Range Status   Specimen Description SPU  Final   Special Requests NONE Reflexed from [id number]  Final   Gram Stain PENDING  Incomplete   Culture APPEARS TO NORMAL FLORA PRESENT  Final   Report Status PENDING  Incomplete    Radiology Reports Dg Chest 2 View  01/27/2015   CLINICAL DATA:  Shortness of breath and cough, followup  EXAM: CHEST  2 VIEW  COMPARISON:  CT chest of 01/22/2015 and chest x-ray of 01/22/2015  FINDINGS: There does appear to have been some interval worsening of the consolidation of a portion of the right upper lobe with areas of cavitation suggesting necrotic pneumonia. Vague opacity at the left costophrenic angle may represent scarring or patchy pneumonia as well. No definite effusion is seen. Heart size is normal. No bony abnormality is seen.  IMPRESSION: Perhaps slight worsening of right upper lobe opacity most consistent with necrotic pneumonia with areas of apparent  cavitation.   Electronically Signed   By: Ivar Drape M.D.   On: 01/27/2015 08:05   Dg Chest 2 View  01/22/2015   CLINICAL DATA:  Pleurisy. Right-sided pain, back pain for 1 week. History of COPD.  EXAM: CHEST  2 VIEW  COMPARISON:  10/15/2013  FINDINGS: Within the posterior right upper lobe is a a 4.7 cm rounded masslike opacity, with surrounding ill-defined density. Additional ill-defined density in the right middle lobe. Underlying emphysema again seen. Probable atelectasis or scarring at the left costophrenic angle. The heart size and mediastinal contours are normal, mild atherosclerosis of the thoracic aorta. No pleural effusion. No pneumothorax. There is degenerative change in the spine.  IMPRESSION: Rounded 4.7 since masslike opacity in the right upper lobe, with surrounding ill-defined density. This may reflect pneumonia versus neoplasm. There is additional opacity in the right  middle lobe concerning for pneumonia.  Chest CT correlation versus radiographic follow-up recommended. While traditional follow-up for pneumonia would be in 3-4 weeks after course of antibiotics, given the masslike appearance, shorter interval follow-up is recommended in 1-2 weeks.   Electronically Signed   By: Jeb Levering M.D.   On: 01/22/2015 06:42   Ct Chest Wo Contrast  01/22/2015   CLINICAL DATA:  Five day history of right-sided chest pain, worsening over the past 2 days. Right upper lobe lung mass on earlier chest x-ray.  EXAM: CT CHEST WITHOUT CONTRAST  TECHNIQUE: Multidetector CT imaging of the chest was performed following the standard protocol without IV contrast.  COMPARISON:  CT chest 10/14/2012. Two-view chest x-ray earlier same date.  FINDINGS: Lack of intravenous contrast makes the examination less sensitive than it would be otherwise. Severe emphysematous changes throughout both lungs with dense masslike opacity in the right upper lobe. Gas bubbles within the opacity. Extensive bullous changes are present in the right upper lobe in the area of the opacity. Patchy airspace opacities are present more inferiorly in the right lower lobe. Minimal patchy airspace opacities anteriorly in the right middle lobe. Masslike pleural-based opacity in the inferior left lower lobe measuring approximately 1.8 x 2.1 x 2.0 cm.  Multiple bilateral lung nodules, many of which are new since the prior CT. The largest nodule is in the inferior left upper lobe and measures approximately 5 mm (series 3, image 30).  No associated pleural effusions.  No pleural masses.  Normal heart size with extensive 3 vessel coronary atherosclerosis. Severe atherosclerosis involving the thoracic and upper abdominal aorta and their visualized branches without evidence of aneurysm. No visible mediastinal, hilar or axillary lymphadenopathy. Visualized thyroid gland unremarkable.  Visualized upper abdomen unremarkable. Degenerative disc  disease and spondylosis throughout the thoracic spine.  IMPRESSION: 1. 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 2. Approximate 2 cm mass in the inferior left lower lobe. 3. Numerous bilateral pulmonary nodules, the largest approximating 5 mm in the left lower lobe. 4. Severe COPD/emphysema. 5. No visible mediastinal, hilar or axillary lymphadenopathy, allowing for the unenhanced technique.   Electronically Signed   By: Evangeline Dakin M.D.   On: 01/22/2015 08:29   Ct Chest W Contrast  01/28/2015   CLINICAL DATA:  Followup right upper lobe lung process.  EXAM: CT CHEST WITH CONTRAST  TECHNIQUE: Multidetector CT imaging of the chest was performed during intravenous contrast administration.  CONTRAST:  47mL OMNIPAQUE IOHEXOL 300 MG/ML  SOLN  COMPARISON:  CT scans 01/22/2015  FINDINGS: Chest  wall: No breast masses, supraclavicular or axillary lymphadenopathy. The thyroid gland appears normal. The bony thorax is intact. No destructive bone lesions or spinal canal compromise.  Mediastinum: The heart is normal in size. No pericardial effusion. Stable advanced atherosclerotic calcifications involving the aorta and branch vessels including the coronary arteries. The esophagus is grossly normal.  Stable small mediastinal lymph nodes. The right hilar node has enlarged slightly. It measures 20 x 21 mm on image number 25.  Lungs/pleura: There is a persistent and progressive necrotic appearing right upper lobe pneumonia. It previously measured a maximum of 7.3 x 2.8 cm and now measures 10.7 x 5.8 cm. Small abscesses are suspected. I do not see the discrete rim enhancing small abscess opposed demonstrated on the prior scan. There is also severe surrounding inflammation in the right lung with marked interstitial thickening and ground-glass opacity. I do not see a discrete mass or endobronchial lesion to  suggest neoplasm.  There are numerous small bilateral pulmonary nodules which are unchanged. The nodular density at the left lung base adjacent to the major fissure in the left lower lobe measures approximately 11 mm. Recommend continued observation.  Upper abdomen:  No significant findings.  IMPRESSION: 1. Worsening necrotic appearing right upper lobe pneumonia with possible small abscesses. Surrounding pneumonitis is also progressive. 2. Slight interval enlargement of right hilar lymph nodes. 3. Stable bilateral pulmonary nodules. 4. Bronchoscopy may be helpful for further evaluation. Repeat chest CT after appropriate antibiotic therapy is recommended.   Electronically Signed   By: Marijo Sanes M.D.   On: 01/28/2015 10:29   Ct Chest W Contrast  01/22/2015   CLINICAL DATA:  Right upper lobe lung mass with productive cough and chest pain.  EXAM: CT CHEST, ABDOMEN, AND PELVIS WITH CONTRAST  TECHNIQUE: Multidetector CT imaging of the chest, abdomen and pelvis was performed following the standard protocol during bolus administration of intravenous contrast.  CONTRAST:  151mL OMNIPAQUE IOHEXOL 300 MG/ML  SOLN  COMPARISON:  Unenhanced CT of the chest earlier today at 737 hours and prior CT on 10/14/2012.  FINDINGS: CT CHEST FINDINGS  Geographic area of peripheral consolidation in the lateral and posterior right upper lobe spans over a region measuring roughly 3.7 x 7.3 x 4.4 cm. Areas of gas formation are consistent with necrosis and there is a small posterior component of focal fluid attenuation measuring roughly 1.5 cm in diameter. Based on the contrast-enhanced CT, this may represent a necrotic pneumonia and is not necessarily representative of malignancy. The small area of liquefaction may represent early focal abscess formation. Additional ground-glass and alveolar opacity in the anterior right upper lobe and right middle lobe likely represent acute infection and extension of infiltrate. Clinical and imaging  follow-up is recommended. If the process does not resolve, or worsens on antibiotic therapy, Pulmonary Medicine evaluation is recommended.  The area of mass-like opacity measured in the anterior aspect of the lateral left lower lobe is again visualized. This is also somewhat geographic in appearance and may not represent a focal mass. Focal atelectasis and scarring is possible. Malignancy is not completely excluded. A 6-7 mm anterior right upper lobe nodule is stable since 2014. A 5 mm anterior left upper lobe nodule is stable since 2014. A 5 mm inferior lingular nodule is stable since 2014. 4 mm subpleural nodule in the right middle lobe is stable since 2014. There are 2 tiny subpleural nodules in the posterior right lower lobe. One of these located more medially was not as apparent on  the 2014 study. Tiny lateral subpleural nodules in the right lower lobe appears stable since the prior study in 2014.  No airway obstruction. Mildly prominent right hilar lymph node measures approximately 11 mm in short axis. There are a few tiny additional mediastinal nodes with no enlarged lymph nodes seen. No pleural or pericardial fluid is seen. The heart size is normal. Mild spondylosis present of the thoracic spine.  CT ABDOMEN AND PELVIS FINDINGS  Solid organs show no evidence of metastatic disease. The liver, pancreas, spleen, adrenal glands and kidneys are normal. There is a small hiatal hernia. The gallbladder has been removed. No enlarged lymph nodes are seen.  Bowel is unremarkable and shows no evidence of obstruction or lesion. No free fluid, free air or abscess is identified. Small right inguinal hernia contains fat. The bladder is unremarkable. The uterus has been removed. The abdominal aorta and iliac arteries show atherosclerotic calcifications without aneurysmal disease or significant obstructive disease identified. No bony lesions are identified.  IMPRESSION: 1. The geographic area of peripheral consolidation in  the right upper lobe has an appearance most likely consistent with necrotic pneumonia with additional small early abscess formation measuring 1.5 cm within this region. Although malignancy cannot be excluded, necrotic pneumonia is favored. Additional alveolar and ground-glass infiltrate extends into the anterior right upper lobe and right middle lobe. If the process does not resolve or worsens, pulmonary Medicine evaluation is recommended. Associated 11 mm right hilar lymph node may be reactive or metastatic. 2. The peripheral lateral left lower lobe opacity may represent focal atelectasis and scarring. Malignancy cannot be excluded, however. Once the pneumonia of the right upper lobe has been adequately treated, if there is persistent opacity in the left lower lobe, PET scan evaluation may be helpful. 3. All of the subcentimeter small nodules identified bilaterally are largely peripheral/subpleural and are stable since the CT in 2014. One of the right lower lobe nodules may be new/more prominent but only measures a few mm in diameter. All of these tiny nodules are most likely postinflammatory and not representative of metastatic nodules. 4. No evidence of pathology in the abdomen or pelvis. No acute findings or evidence of metastatic disease. Incidental small hiatal hernia and small right inguinal hernia containing fat.   Electronically Signed   By: Aletta Edouard M.D.   On: 01/22/2015 13:45   Ct Abdomen Pelvis W Contrast  01/22/2015   CLINICAL DATA:  Right upper lobe lung mass with productive cough and chest pain.  EXAM: CT CHEST, ABDOMEN, AND PELVIS WITH CONTRAST  TECHNIQUE: Multidetector CT imaging of the chest, abdomen and pelvis was performed following the standard protocol during bolus administration of intravenous contrast.  CONTRAST:  12mL OMNIPAQUE IOHEXOL 300 MG/ML  SOLN  COMPARISON:  Unenhanced CT of the chest earlier today at 737 hours and prior CT on 10/14/2012.  FINDINGS: CT CHEST FINDINGS   Geographic area of peripheral consolidation in the lateral and posterior right upper lobe spans over a region measuring roughly 3.7 x 7.3 x 4.4 cm. Areas of gas formation are consistent with necrosis and there is a small posterior component of focal fluid attenuation measuring roughly 1.5 cm in diameter. Based on the contrast-enhanced CT, this may represent a necrotic pneumonia and is not necessarily representative of malignancy. The small area of liquefaction may represent early focal abscess formation. Additional ground-glass and alveolar opacity in the anterior right upper lobe and right middle lobe likely represent acute infection and extension of infiltrate. Clinical and imaging follow-up  is recommended. If the process does not resolve, or worsens on antibiotic therapy, Pulmonary Medicine evaluation is recommended.  The area of mass-like opacity measured in the anterior aspect of the lateral left lower lobe is again visualized. This is also somewhat geographic in appearance and may not represent a focal mass. Focal atelectasis and scarring is possible. Malignancy is not completely excluded. A 6-7 mm anterior right upper lobe nodule is stable since 2014. A 5 mm anterior left upper lobe nodule is stable since 2014. A 5 mm inferior lingular nodule is stable since 2014. 4 mm subpleural nodule in the right middle lobe is stable since 2014. There are 2 tiny subpleural nodules in the posterior right lower lobe. One of these located more medially was not as apparent on the 2014 study. Tiny lateral subpleural nodules in the right lower lobe appears stable since the prior study in 2014.  No airway obstruction. Mildly prominent right hilar lymph node measures approximately 11 mm in short axis. There are a few tiny additional mediastinal nodes with no enlarged lymph nodes seen. No pleural or pericardial fluid is seen. The heart size is normal. Mild spondylosis present of the thoracic spine.  CT ABDOMEN AND PELVIS FINDINGS   Solid organs show no evidence of metastatic disease. The liver, pancreas, spleen, adrenal glands and kidneys are normal. There is a small hiatal hernia. The gallbladder has been removed. No enlarged lymph nodes are seen.  Bowel is unremarkable and shows no evidence of obstruction or lesion. No free fluid, free air or abscess is identified. Small right inguinal hernia contains fat. The bladder is unremarkable. The uterus has been removed. The abdominal aorta and iliac arteries show atherosclerotic calcifications without aneurysmal disease or significant obstructive disease identified. No bony lesions are identified.  IMPRESSION: 1. The geographic area of peripheral consolidation in the right upper lobe has an appearance most likely consistent with necrotic pneumonia with additional small early abscess formation measuring 1.5 cm within this region. Although malignancy cannot be excluded, necrotic pneumonia is favored. Additional alveolar and ground-glass infiltrate extends into the anterior right upper lobe and right middle lobe. If the process does not resolve or worsens, pulmonary Medicine evaluation is recommended. Associated 11 mm right hilar lymph node may be reactive or metastatic. 2. The peripheral lateral left lower lobe opacity may represent focal atelectasis and scarring. Malignancy cannot be excluded, however. Once the pneumonia of the right upper lobe has been adequately treated, if there is persistent opacity in the left lower lobe, PET scan evaluation may be helpful. 3. All of the subcentimeter small nodules identified bilaterally are largely peripheral/subpleural and are stable since the CT in 2014. One of the right lower lobe nodules may be new/more prominent but only measures a few mm in diameter. All of these tiny nodules are most likely postinflammatory and not representative of metastatic nodules. 4. No evidence of pathology in the abdomen or pelvis. No acute findings or evidence of metastatic  disease. Incidental small hiatal hernia and small right inguinal hernia containing fat.   Electronically Signed   By: Aletta Edouard M.D.   On: 01/22/2015 13:45     CBC  Recent Labs Lab 01/24/15 0616 01/28/15 1016  WBC 8.5 8.2  HGB 9.0* 9.2*  HCT 27.4* 28.3*  PLT 222 349  MCV 95.3 94.7  MCH 31.4 30.8  MCHC 32.9 32.6  RDW 15.2* 15.8*  LYMPHSABS  --  0.6*  MONOABS  --  0.3  EOSABS  --  0.0  BASOSABS  --  0.0    Chemistries   Recent Labs Lab 01/24/15 0616 01/27/15 0915 01/30/15 0158  NA 137  --   --   K 3.5  --   --   CL 105  --   --   CO2 24  --   --   GLUCOSE 99  --   --   BUN 9  --   --   CREATININE 0.98 0.79 1.15*  CALCIUM 8.3*  --   --    ------------------------------------------------------------------------------------------------------------------ estimated creatinine clearance is 45.5 mL/min (by C-G formula based on Cr of 1.15). ------------------------------------------------------------------------------------------------------------------ No results for input(s): HGBA1C in the last 72 hours. ------------------------------------------------------------------------------------------------------------------ No results for input(s): CHOL, HDL, LDLCALC, TRIG, CHOLHDL, LDLDIRECT in the last 72 hours. ------------------------------------------------------------------------------------------------------------------ No results for input(s): TSH, T4TOTAL, T3FREE, THYROIDAB in the last 72 hours.  Invalid input(s): FREET3 ------------------------------------------------------------------------------------------------------------------ No results for input(s): VITAMINB12, FOLATE, FERRITIN, TIBC, IRON, RETICCTPCT in the last 72 hours.  Coagulation profile No results for input(s): INR, PROTIME in the last 168 hours.  No results for input(s): DDIMER in the last 72 hours.  Cardiac Enzymes No results for input(s): CKMB, TROPONINI, MYOGLOBIN in the last 168  hours.  Invalid input(s): CK ------------------------------------------------------------------------------------------------------------------ Invalid input(s): POCBNP    Assessment & Plan   Active Problems:   Necrotizing pneumonia IMPRESSION AND PLAN: Patient is a 59 year old African-American female with history of nicotine addiction, COPD presents with right-sided chest pain noted to have a mass as well as possible pneumonia.  #1.  Necorotizing pna-  ct worsening, continue vanc and zosyn and clindamycin. Plan for bronch next week, to evaluate for fungal infection as well as malignancy  #2. COPD; there is no evidence of acute exacerbation continue her on Advair and Spiriva. And Combivent inhalers as needed.  #3. Peripheral vascular disease;   aspirin  #4. Nicotine addiction smoking cessation done     Code Status Orders        Start     Ordered   01/22/15 0947  Full code   Continuous     01/22/15 0948      Family Communication: Patient   Disposition Plan: Home   Procedures none   Consults pulmonary consult , ID consult   DVT Prophylaxis  SCDs  Lab Results  Component Value Date   PLT 349 01/28/2015     Time Spent in minutes 25 minutes   Dustin Flock M.D on 01/30/2015 at 11:07 AM  Between 7am to 6pm - Pager - [phone]  After 6pm go to [email or web address] - password [email or web address] [facility]   Office  [phone]

## 2015-01-30 NOTE — Plan of Care (Signed)
Problem: Discharge Progression Outcomes Goal: Discharge plan in place and appropriate Individualization  Outcome: Progressing Care Plan Note: Pain: patient had no complaints of pain today. Rested in bed and up to bathroom. Up in room. Hemodynamics: Vital signs stable today - had temp in early hours - but afebrile today. Complications: Patient complaining of frequent cough. Unproductive. Cough medicine and lozenges given.  Diet: Has little appetite. Taking bites of food. Activity: Up and about in room. Tolerating activity well. Discharge plan: For possible bronchoscopy this week - per MD note.

## 2015-01-30 NOTE — Progress Notes (Signed)
Dr. Volanda Napoleon notified of patients c/o heartburn. New order entered for tums.

## 2015-01-30 NOTE — Plan of Care (Signed)
Problem: Discharge Progression Outcomes Goal: Discharge plan in place and appropriate Individualization  Patient prefers to be called [name], from home.         Current smoker.         Hx: COPD, HTN, MI, PVD - controlled with home medications.         Moderate Fall Risk. Pt shows understanding how to utilize call system for assistance.    Goal: Other Discharge Outcomes/Goals Outcome: Progressing Plan of care progress to goals:  1. Pain or c/o: Productive cough noted. PRN Robitussin/codeine syrup and cepacol given for cough with improvement noted; otherwise denies pain.            2. Hemodynamic- afebrile this shift, IVF and IV antibiotic continue infusing per MD order.     3. No s/s of complications.      4. Diet: No c/o nausea/vomiting.  5. Activity: Independent.

## 2015-01-30 NOTE — Progress Notes (Signed)
ANTIBIOTIC CONSULT NOTE - FOLLOW UP  Pharmacy Consult for Vancomycin Indication: pneumonia  Allergies  Allergen Reactions  . Aspirin Nausea And Vomiting and Other (See Comments)    Pt states aspirin makes her cramp and have to use coated kind.    Patient Measurements: Height: 5\' 4"  (162.6 cm) Weight: 126 lb 4 oz (57.267 kg) IBW/kg (Calculated) : 54.7   Vital Signs: Temp: 98.5 F (36.9 C) (05/29 1241) Temp Source: Oral (05/29 1241) BP: 132/62 mmHg (05/29 1241) Pulse Rate: 69 (05/29 1241)  Labs:  Recent Labs  01/28/15 1016 01/30/15 0158  WBC 8.2  --   HGB 9.2*  --   PLT 349  --   CREATININE  --  1.15*   Estimated Creatinine Clearance: 45.5 mL/min (by C-G formula based on Cr of 1.15).  Recent Labs  01/30/15 0158 01/30/15 1405  VANCOTROUGH 34* 14     Microbiology: Recent Results (from the past 720 hour(s))  Culture, blood (routine x 2)     Status: None   Collection Time: 01/22/15  9:02 AM  Result Value Ref Range Status   Specimen Description BLOOD  Final   Special Requests NONE  Final   Culture NO GROWTH 5 DAYS  Final   Report Status 01/27/2015 FINAL  Final  Culture, blood (routine x 2)     Status: None   Collection Time: 01/22/15  9:02 AM  Result Value Ref Range Status   Specimen Description BLOOD  Final   Special Requests NONE  Final   Culture NO GROWTH 5 DAYS  Final   Report Status 01/27/2015 FINAL  Final  Culture, sputum-assessment     Status: None   Collection Time: 01/25/15  2:47 AM  Result Value Ref Range Status   Specimen Description SPU  Final   Special Requests NONE  Final   Sputum evaluation   Final    THIS SPECIMEN IS ACCEPTABLE FOR SPUTUM CULTURE CTJ 01/25/15    Report Status 01/25/2015 FINAL  Final  Culture, respiratory (NON-Expectorated)     Status: None (Preliminary result)   Collection Time: 01/25/15  2:47 AM  Result Value Ref Range Status   Specimen Description SPU  Final   Special Requests NONE Reflexed from [id number]  Final   Gram  Stain PENDING  Incomplete   Culture APPEARS TO NORMAL FLORA PRESENT  Final   Report Status PENDING  Incomplete    Medical History: Past Medical History  Diagnosis Date  . 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negative cardiac cath  . COPD (chronic obstructive pulmonary disease)   . Hypertension   . Peripheral vascular disease   . Nicotine addiction   . Asthma     Medications:  Anti-infectives    Start     Dose/Rate Route Frequency Ordered Stop   01/30/15 1600  vancomycin (VANCOCIN) 1,250 mg in sodium chloride 0.9 % 250 mL IVPB     1,250 mg 166.7 mL/hr over 90 Minutes Intravenous Every 18 hours 01/30/15 0346     01/30/15 0330  vancomycin (VANCOCIN) 1,250 mg in sodium chloride 0.9 % 250 mL IVPB  Status:  Discontinued     1,250 mg 166.7 mL/hr over 90 Minutes Intravenous Every 18 hours 01/30/15 0321 01/30/15 0346   01/28/15 2200  piperacillin-tazobactam (ZOSYN) IVPB 3.375 g     3.375 g 12.5 mL/hr over 240 Minutes Intravenous 3 times per day 01/28/15 1401     01/28/15 1600  clindamycin (CLEOCIN) IVPB 600 mg     600 mg  100 mL/hr over 30 Minutes Intravenous Every 6 hours 01/28/15 1325     01/28/15 1430  vancomycin (VANCOCIN) 1,250 mg in sodium chloride 0.9 % 250 mL IVPB  Status:  Discontinued     1,250 mg 166.7 mL/hr over 90 Minutes Intravenous Every 12 hours 01/28/15 1352 01/28/15 1354   01/28/15 1430  vancomycin (VANCOCIN) 1,500 mg in sodium chloride 0.9 % 500 mL IVPB  Status:  Discontinued     1,500 mg 250 mL/hr over 120 Minutes Intravenous Every 12 hours 01/28/15 1354 01/30/15 0321   01/28/15 1330  piperacillin-tazobactam (ZOSYN) IVPB 3.375 g  Status:  Discontinued     3.375 g 12.5 mL/hr over 240 Minutes Intravenous 4 times per day 01/28/15 1325 01/28/15 1401   01/27/15 2200  amoxicillin-clavulanate (AUGMENTIN) 875-125 MG per tablet 1 tablet  Status:  Discontinued     1 tablet Oral Every 12 hours 01/27/15 1702 01/28/15 1324   01/26/15 0630  vancomycin (VANCOCIN) 1,250 mg in sodium chloride  0.9 % 250 mL IVPB  Status:  Discontinued     1,250 mg 166.7 mL/hr over 90 Minutes Intravenous Every 12 hours 01/26/15 0603 01/28/15 0906   01/22/15 1600  piperacillin-tazobactam (ZOSYN) IVPB 3.375 g  Status:  Discontinued     3.375 g 12.5 mL/hr over 240 Minutes Intravenous 3 times per day 01/22/15 1352 01/27/15 1702   01/22/15 1430  azithromycin (ZITHROMAX) 500 mg in dextrose 5 % 250 mL IVPB     500 mg 250 mL/hr over 60 Minutes Intravenous  Once 01/22/15 1351 01/22/15 1606   01/22/15 1400  piperacillin-tazobactam (ZOSYN) IVPB 3.375 g  Status:  Discontinued     3.375 g 12.5 mL/hr over 240 Minutes Intravenous 3 times per day 01/22/15 0951 01/22/15 1352   01/22/15 1200  vancomycin (VANCOCIN) IVPB 1000 mg/200 mL premix  Status:  Discontinued     1,000 mg 200 mL/hr over 60 Minutes Intravenous Every 18 hours 01/22/15 1124 01/26/15 0603   01/22/15 1000  vancomycin (VANCOCIN) IVPB 1000 mg/200 mL premix  Status:  Discontinued     1,000 mg 200 mL/hr over 60 Minutes Intravenous Every 24 hours 01/22/15 0951 01/22/15 1124   01/22/15 0845  cefTRIAXone (ROCEPHIN) 1 g in dextrose 5 % 50 mL IVPB     1 g 100 mL/hr over 30 Minutes Intravenous  Once 01/22/15 0843 01/22/15 0931   01/22/15 0845  azithromycin (ZITHROMAX) 500 mg in dextrose 5 % 250 mL IVPB  Status:  Discontinued     500 mg 250 mL/hr over 60 Minutes Intravenous  Once 01/22/15 0843 01/22/15 1351     Assessment: 59 YO female currently ordered vancomycin, zosyn, and clindamycin for treatment of necrotizing pneumonia. ID following. Ke 0.046 h^-1    T1/2 15.07 h    Vd 43.2 L 5/25 Vanc trough: 7 5/27 Vanc trough: 14 5/29 Vanc trough at 0200: 34 mcg/ml 5/29 Vanc trough at 1400: 14 mcg/ml  Goal of Therapy:  Vancomycin trough level 15-20 mcg/ml Plan: 5/26 : Vanc trough @ 18:00 = 53 mcg/mL .  Spoke with lab , we believe this result to be in error (vanc trough was probably drawn as vancomycin was infusing). Will repeat Vanc trough on 5/27 @ 6:00  = 14.  5/27 Will increase dose to 1500mg  IV q12h to start today at 1430. Dose not given this morning at 0600 as vancomycin was discontinued per MD. Will order follow-up trough prior to dose on 5/29 @0200 .  5/29 Called RN and informed her of  elevated trough level and asked her to stop the infusion which had been running for ~ 25 minutes (about 1/4 dose infused). Will check another level in 12 hours to make sure patient is clearing the drug. Will decrease to Vancomycin 1250mg  IV q18h if next level is within range.   5/29 1500: Trough = 14. Will decrease dose to Vancomycin 1250mg  IV Q18h. F/u Scr. Will order next trough for 5/31 at 2130. Follow renal fxn  Chinita Greenland PharmD Clinical Pharmacist 01/30/2015

## 2015-01-30 NOTE — Care Management (Signed)
Bronch pending per Dr. Ola Spurr. Patient now on room air (no O2). RNCM to follow for potential IV ABX need at discharge.

## 2015-01-31 LAB — CREATININE, SERUM
CREATININE: 1.07 mg/dL — AB (ref 0.44–1.00)
GFR calc non Af Amer: 56 mL/min — ABNORMAL LOW (ref >60)

## 2015-01-31 NOTE — Progress Notes (Signed)
[place] at [facility]                                                                                                                                                                                            Patient Demographics   [name], is a 59 y.o. female, DOB - 06-May-1956, MEQ:[id number]  Admit date - 01/22/2015   Admitting Physician Dustin Flock, MD  Outpatient Primary MD for the patient is No primary care provider on file.  LOS - 9  Chief Complaint  Patient presents with  . Pleurisy   doing okay, denies any chest pains or shortness of breath today no cough    Review of Systems:   CONSTITUTIONAL: Positive fever. No fatigue, weakness. No weight gain, no weight loss.  EYES: No blurry or double vision.  ENT: No tinnitus. No postnasal drip. No redness of the oropharynx.  RESPIRATORY: Dry cough, no wheeze, no hemoptysis. Mild dyspnea.  CARDIOVASCULAR: Right-sided chest pain. No orthopnea. No palpitations. No syncope.  GASTROINTESTINAL: No nausea, no vomiting or diarrhea. No abdominal pain. No melena or hematochezia.  GENITOURINARY: No dysuria or hematuria.  ENDOCRINE: No polyuria or nocturia. No heat or cold intolerance.  HEMATOLOGY: No anemia. No bruising. No bleeding.  INTEGUMENTARY: No rashes. No lesions.  MUSCULOSKELETAL: No arthritis. No swelling. No gout.  NEUROLOGIC: No numbness, tingling, or ataxia. No seizure-type activity.  PSYCHIATRIC: No anxiety. No insomnia. No ADD.    Vitals:   Filed Vitals:   01/30/15 1241 01/30/15 2134 01/31/15 0547 01/31/15 0956  BP: 132/62 143/58 142/52 125/60  Pulse: 69 83 76 86  Temp: 98.5 F (36.9 C) 99.3 F (37.4 C) 98.4 F (36.9 C) 98.4 F (36.9 C)  TempSrc: Oral Oral Oral Oral  Resp:  18 18   Height:      Weight:      SpO2: 97% 96% 95% 98%    Wt Readings from Last 3 Encounters:  01/22/15 57.267 kg (126 lb 4 oz)     Intake/Output Summary (Last 24 hours) at 01/31/15  1001 Last data filed at 01/31/15 0530  Gross per 24 hour  Intake    830 ml  Output    650 ml  Net    180 ml    Physical Exam:   GENERAL: Pleasant-appearing in no apparent distress.  HEAD, EYES, EARS, NOSE AND THROAT: Atraumatic, normocephalic. Extraocular muscles are intact. Pupils equal and reactive to light. Sclerae anicteric. No conjunctival injection. No oro-pharyngeal erythema.  NECK: Supple. There is no jugular venous distention. No bruits, no lymphadenopathy, no thyromegaly.  HEART: Regular rate and rhythm, tachycardic.  No murmurs, no rubs, no clicks.  LUNGS: Occasional rhonchi. No rales or  No wheezes.  ABDOMEN: Soft, flat, nontender, nondistended. Has good bowel sounds. No hepatosplenomegaly appreciated.  EXTREMITIES: No evidence of any cyanosis, clubbing, or peripheral edema.  +2 pedal and radial pulses bilaterally.  NEUROLOGIC: The patient is alert, awake, and oriented x3 with no focal motor or sensory deficits appreciated bilaterally.  SKIN: Moist and warm with no rashes appreciated.  Psych: Not anxious, depressed LN: No inguinal LN enlargement    Antibiotics   Anti-infectives    Start     Dose/Rate Route Frequency Ordered Stop   01/30/15 1600  vancomycin (VANCOCIN) 1,250 mg in sodium chloride 0.9 % 250 mL IVPB     1,250 mg 166.7 mL/hr over 90 Minutes Intravenous Every 18 hours 01/30/15 0346     01/30/15 0330  vancomycin (VANCOCIN) 1,250 mg in sodium chloride 0.9 % 250 mL IVPB  Status:  Discontinued     1,250 mg 166.7 mL/hr over 90 Minutes Intravenous Every 18 hours 01/30/15 0321 01/30/15 0346   01/28/15 2200  piperacillin-tazobactam (ZOSYN) IVPB 3.375 g     3.375 g 12.5 mL/hr over 240 Minutes Intravenous 3 times per day 01/28/15 1401     01/28/15 1600  clindamycin (CLEOCIN) IVPB 600 mg     600 mg 100 mL/hr over 30 Minutes Intravenous Every 6 hours 01/28/15 1325     01/28/15 1430  vancomycin (VANCOCIN) 1,250 mg in sodium chloride 0.9 % 250 mL IVPB  Status:   Discontinued     1,250 mg 166.7 mL/hr over 90 Minutes Intravenous Every 12 hours 01/28/15 1352 01/28/15 1354   01/28/15 1430  vancomycin (VANCOCIN) 1,500 mg in sodium chloride 0.9 % 500 mL IVPB  Status:  Discontinued     1,500 mg 250 mL/hr over 120 Minutes Intravenous Every 12 hours 01/28/15 1354 01/30/15 0321   01/28/15 1330  piperacillin-tazobactam (ZOSYN) IVPB 3.375 g  Status:  Discontinued     3.375 g 12.5 mL/hr over 240 Minutes Intravenous 4 times per day 01/28/15 1325 01/28/15 1401   01/27/15 2200  amoxicillin-clavulanate (AUGMENTIN) 875-125 MG per tablet 1 tablet  Status:  Discontinued     1 tablet Oral Every 12 hours 01/27/15 1702 01/28/15 1324   01/26/15 0630  vancomycin (VANCOCIN) 1,250 mg in sodium chloride 0.9 % 250 mL IVPB  Status:  Discontinued     1,250 mg 166.7 mL/hr over 90 Minutes Intravenous Every 12 hours 01/26/15 0603 01/28/15 0906   01/22/15 1600  piperacillin-tazobactam (ZOSYN) IVPB 3.375 g  Status:  Discontinued     3.375 g 12.5 mL/hr over 240 Minutes Intravenous 3 times per day 01/22/15 1352 01/27/15 1702   01/22/15 1430  azithromycin (ZITHROMAX) 500 mg in dextrose 5 % 250 mL IVPB     500 mg 250 mL/hr over 60 Minutes Intravenous  Once 01/22/15 1351 01/22/15 1606   01/22/15 1400  piperacillin-tazobactam (ZOSYN) IVPB 3.375 g  Status:  Discontinued     3.375 g 12.5 mL/hr over 240 Minutes Intravenous 3 times per day 01/22/15 0951 01/22/15 1352   01/22/15 1200  vancomycin (VANCOCIN) IVPB 1000 mg/200 mL premix  Status:  Discontinued     1,000 mg 200 mL/hr over 60 Minutes Intravenous Every 18 hours 01/22/15 1124 01/26/15 0603   01/22/15 1000  vancomycin (VANCOCIN) IVPB 1000 mg/200 mL premix  Status:  Discontinued     1,000 mg 200 mL/hr over 60 Minutes Intravenous Every 24 hours 01/22/15 0951 01/22/15 1124  01/22/15 0845  cefTRIAXone (ROCEPHIN) 1 g in dextrose 5 % 50 mL IVPB     1 g 100 mL/hr over 30 Minutes Intravenous  Once 01/22/15 0843 01/22/15 0931   01/22/15  0845  azithromycin (ZITHROMAX) 500 mg in dextrose 5 % 250 mL IVPB  Status:  Discontinued     500 mg 250 mL/hr over 60 Minutes Intravenous  Once 01/22/15 0843 01/22/15 1351      Medications   Scheduled Meds: . aspirin EC  325 mg Oral Daily  . atorvastatin  80 mg Oral BH-[id number]  . clindamycin (CLEOCIN) IV  600 mg Intravenous Q6H  . enoxaparin (LOVENOX) injection  40 mg Subcutaneous Q24H  . feeding supplement (ENSURE ENLIVE)  237 mL Oral TID WC  . ferrous sulfate  325 mg Oral Daily  . guaiFENesin  600 mg Oral BID  . losartan  50 mg Oral Daily  . metoprolol  100 mg Oral Daily  . piperacillin-tazobactam (ZOSYN)  IV  3.375 g Intravenous 3 times per day  . polyethylene glycol  17 g Oral Daily  . vancomycin  1,250 mg Intravenous Q18H  . zolpidem  5 mg Oral QHS   Continuous Infusions:   PRN Meds:.acetaminophen **OR** acetaminophen, calcium carbonate, guaiFENesin-codeine, HYDROcodone-acetaminophen, lactulose, menthol-cetylpyridinium, morphine injection, nicotine polacrilex, ondansetron **OR** ondansetron (ZOFRAN) IV, senna-docusate, zolpidem   Data Review:   Micro Results Recent Results (from the past 240 hour(s))  Culture, blood (routine x 2)     Status: None   Collection Time: 01/22/15  9:02 AM  Result Value Ref Range Status   Specimen Description BLOOD  Final   Special Requests NONE  Final   Culture NO GROWTH 5 DAYS  Final   Report Status 01/27/2015 FINAL  Final  Culture, blood (routine x 2)     Status: None   Collection Time: 01/22/15  9:02 AM  Result Value Ref Range Status   Specimen Description BLOOD  Final   Special Requests NONE  Final   Culture NO GROWTH 5 DAYS  Final   Report Status 01/27/2015 FINAL  Final  Culture, sputum-assessment     Status: None   Collection Time: 01/25/15  2:47 AM  Result Value Ref Range Status   Specimen Description SPU  Final   Special Requests NONE  Final   Sputum evaluation   Final    THIS SPECIMEN IS ACCEPTABLE FOR SPUTUM CULTURE CTJ  01/25/15    Report Status 01/25/2015 FINAL  Final  Culture, respiratory (NON-Expectorated)     Status: None (Preliminary result)   Collection Time: 01/25/15  2:47 AM  Result Value Ref Range Status   Specimen Description SPU  Final   Special Requests NONE Reflexed from [id number]  Final   Gram Stain PENDING  Incomplete   Culture APPEARS TO NORMAL FLORA PRESENT  Final   Report Status PENDING  Incomplete    Radiology Reports Dg Chest 2 View  01/27/2015   CLINICAL DATA:  Shortness of breath and cough, followup  EXAM: CHEST  2 VIEW  COMPARISON:  CT chest of 01/22/2015 and chest x-ray of 01/22/2015  FINDINGS: There does appear to have been some interval worsening of the consolidation of a portion of the right upper lobe with areas of cavitation suggesting necrotic pneumonia. Vague opacity at the left costophrenic angle may represent scarring or patchy pneumonia as well. No definite effusion is seen. Heart size is normal. No bony abnormality is seen.  IMPRESSION: Perhaps slight worsening of right upper lobe opacity most consistent  with necrotic pneumonia with areas of apparent cavitation.   Electronically Signed   By: Ivar Drape M.D.   On: 01/27/2015 08:05   Dg Chest 2 View  01/22/2015   CLINICAL DATA:  Pleurisy. Right-sided pain, back pain for 1 week. History of COPD.  EXAM: CHEST  2 VIEW  COMPARISON:  10/15/2013  FINDINGS: Within the posterior right upper lobe is a a 4.7 cm rounded masslike opacity, with surrounding ill-defined density. Additional ill-defined density in the right middle lobe. Underlying emphysema again seen. Probable atelectasis or scarring at the left costophrenic angle. The heart size and mediastinal contours are normal, mild atherosclerosis of the thoracic aorta. No pleural effusion. No pneumothorax. There is degenerative change in the spine.  IMPRESSION: Rounded 4.7 since masslike opacity in the right upper lobe, with surrounding ill-defined density. This may reflect pneumonia versus  neoplasm. There is additional opacity in the right middle lobe concerning for pneumonia.  Chest CT correlation versus radiographic follow-up recommended. While traditional follow-up for pneumonia would be in 3-4 weeks after course of antibiotics, given the masslike appearance, shorter interval follow-up is recommended in 1-2 weeks.   Electronically Signed   By: Jeb Levering M.D.   On: 01/22/2015 06:42   Ct Chest Wo Contrast  01/22/2015   CLINICAL DATA:  Five day history of right-sided chest pain, worsening over the past 2 days. Right upper lobe lung mass on earlier chest x-ray.  EXAM: CT CHEST WITHOUT CONTRAST  TECHNIQUE: Multidetector CT imaging of the chest was performed following the standard protocol without IV contrast.  COMPARISON:  CT chest 10/14/2012. Two-view chest x-ray earlier same date.  FINDINGS: Lack of intravenous contrast makes the examination less sensitive than it would be otherwise. Severe emphysematous changes throughout both lungs with dense masslike opacity in the right upper lobe. Gas bubbles within the opacity. Extensive bullous changes are present in the right upper lobe in the area of the opacity. Patchy airspace opacities are present more inferiorly in the right lower lobe. Minimal patchy airspace opacities anteriorly in the right middle lobe. Masslike pleural-based opacity in the inferior left lower lobe measuring approximately 1.8 x 2.1 x 2.0 cm.  Multiple bilateral lung nodules, many of which are new since the prior CT. The largest nodule is in the inferior left upper lobe and measures approximately 5 mm (series 3, image 30).  No associated pleural effusions.  No pleural masses.  Normal heart size with extensive 3 vessel coronary atherosclerosis. Severe atherosclerosis involving the thoracic and upper abdominal aorta and their visualized branches without evidence of aneurysm. No visible mediastinal, hilar or axillary lymphadenopathy. Visualized thyroid gland unremarkable.   Visualized upper abdomen unremarkable. 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  IMPRESSION: 1. 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 2. Approximate 2 cm mass in the inferior left lower lobe. 3. Numerous bilateral pulmonary nodules, the largest approximating 5 mm in the left lower lobe. 4. Severe COPD/emphysema. 5. No visible mediastinal, hilar or axillary lymphadenopathy, allowing for the unenhanced technique.   Electronically Signed   By: Evangeline Dakin M.D.   On: 01/22/2015 08:29   Ct Chest W Contrast  01/28/2015   CLINICAL DATA:  Followup right upper lobe lung process.  EXAM: CT CHEST WITH CONTRAST  TECHNIQUE: Multidetector CT imaging of the chest was performed during intravenous contrast administration.  CONTRAST:  109mL OMNIPAQUE IOHEXOL 300 MG/ML  SOLN  COMPARISON:  CT scans 01/22/2015  FINDINGS: Chest wall: No breast masses, supraclavicular or axillary lymphadenopathy. The thyroid gland appears normal. The bony thorax is intact. No destructive bone lesions or spinal canal compromise.  Mediastinum: The heart is normal in size. No pericardial effusion. Stable advanced atherosclerotic calcifications involving the aorta and branch vessels including the coronary arteries. The esophagus is grossly normal.  Stable small mediastinal lymph nodes. The right hilar node has enlarged slightly. It measures 20 x 21 mm on image number 25.  Lungs/pleura: There is a persistent and progressive necrotic appearing right upper lobe pneumonia. It previously measured a maximum of 7.3 x 2.8 cm and now measures 10.7 x 5.8 cm. Small abscesses are suspected. I do not see the discrete rim enhancing small abscess opposed demonstrated on the prior scan. There is also severe surrounding inflammation in the right lung with marked interstitial thickening and ground-glass opacity. I  do not see a discrete mass or endobronchial lesion to suggest neoplasm.  There are numerous small bilateral pulmonary nodules which are unchanged. The nodular density at the left lung base adjacent to the major fissure in the left lower lobe measures approximately 11 mm. Recommend continued observation.  Upper abdomen:  No significant findings.  IMPRESSION: 1. Worsening necrotic appearing right upper lobe pneumonia with possible small abscesses. Surrounding pneumonitis is also progressive. 2. Slight interval enlargement of right hilar lymph nodes. 3. Stable bilateral pulmonary nodules. 4. Bronchoscopy may be helpful for further evaluation. Repeat chest CT after appropriate antibiotic therapy is recommended.   Electronically Signed   By: Marijo Sanes M.D.   On: 01/28/2015 10:29   Ct Chest W Contrast  01/22/2015   CLINICAL DATA:  Right upper lobe lung mass with productive cough and chest pain.  EXAM: CT CHEST, ABDOMEN, AND PELVIS WITH CONTRAST  TECHNIQUE: Multidetector CT imaging of the chest, abdomen and pelvis was performed following the standard protocol during bolus administration of intravenous contrast.  CONTRAST:  188mL OMNIPAQUE IOHEXOL 300 MG/ML  SOLN  COMPARISON:  Unenhanced CT of the chest earlier today at 737 hours and prior CT on 10/14/2012.  FINDINGS: CT CHEST FINDINGS  Geographic area of peripheral consolidation in the lateral and posterior right upper lobe spans over a region measuring roughly 3.7 x 7.3 x 4.4 cm. Areas of gas formation are consistent with necrosis and there is a small posterior component of focal fluid attenuation measuring roughly 1.5 cm in diameter. Based on the contrast-enhanced CT, this may represent a necrotic pneumonia and is not necessarily representative of malignancy. The small area of liquefaction may represent early focal abscess formation. Additional ground-glass and alveolar opacity in the anterior right upper lobe and right middle lobe likely represent acute infection  and extension of infiltrate. Clinical and imaging follow-up is recommended. If the process does not resolve, or worsens on antibiotic therapy, Pulmonary Medicine evaluation is recommended.  The area of mass-like opacity measured in the anterior aspect of the lateral left lower lobe is again visualized. This is also somewhat geographic in appearance and may not represent a focal mass. Focal atelectasis and scarring is possible. Malignancy is not completely excluded. A 6-7 mm anterior right upper lobe nodule is stable since 2014. A 5 mm anterior left upper lobe nodule is stable since 2014. A 5 mm inferior lingular nodule is stable since 2014. 4 mm subpleural nodule in the right middle lobe is stable since 2014. There are 2 tiny subpleural nodules in the posterior right lower lobe. One of these located more  medially was not as apparent on the 2014 study. Tiny lateral subpleural nodules in the right lower lobe appears stable since the prior study in 2014.  No airway obstruction. Mildly prominent right hilar lymph node measures approximately 11 mm in short axis. There are a few tiny additional mediastinal nodes with no enlarged lymph nodes seen. No pleural or pericardial fluid is seen. The heart size is normal. Mild spondylosis present of the thoracic spine.  CT ABDOMEN AND PELVIS FINDINGS  Solid organs show no evidence of metastatic disease. The liver, pancreas, spleen, adrenal glands and kidneys are normal. There is a small hiatal hernia. The gallbladder has been removed. No enlarged lymph nodes are seen.  Bowel is unremarkable and shows no evidence of obstruction or lesion. No free fluid, free air or abscess is identified. Small right inguinal hernia contains fat. The bladder is unremarkable. The uterus has been removed. The abdominal aorta and iliac arteries show atherosclerotic calcifications without aneurysmal disease or significant obstructive disease identified. No bony lesions are identified.  IMPRESSION: 1. The  geographic area of peripheral consolidation in the right upper lobe has an appearance most likely consistent with necrotic pneumonia with additional small early abscess formation measuring 1.5 cm within this region. Although malignancy cannot be excluded, necrotic pneumonia is favored. Additional alveolar and ground-glass infiltrate extends into the anterior right upper lobe and right middle lobe. If the process does not resolve or worsens, pulmonary Medicine evaluation is recommended. Associated 11 mm right hilar lymph node may be reactive or metastatic. 2. The peripheral lateral left lower lobe opacity may represent focal atelectasis and scarring. Malignancy cannot be excluded, however. Once the pneumonia of the right upper lobe has been adequately treated, if there is persistent opacity in the left lower lobe, PET scan evaluation may be helpful. 3. All of the subcentimeter small nodules identified bilaterally are largely peripheral/subpleural and are stable since the CT in 2014. One of the right lower lobe nodules may be new/more prominent but only measures a few mm in diameter. All of these tiny nodules are most likely postinflammatory and not representative of metastatic nodules. 4. No evidence of pathology in the abdomen or pelvis. No acute findings or evidence of metastatic disease. Incidental small hiatal hernia and small right inguinal hernia containing fat.   Electronically Signed   By: Aletta Edouard M.D.   On: 01/22/2015 13:45   Ct Abdomen Pelvis W Contrast  01/22/2015   CLINICAL DATA:  Right upper lobe lung mass with productive cough and chest pain.  EXAM: CT CHEST, ABDOMEN, AND PELVIS WITH CONTRAST  TECHNIQUE: Multidetector CT imaging of the chest, abdomen and pelvis was performed following the standard protocol during bolus administration of intravenous contrast.  CONTRAST:  181mL OMNIPAQUE IOHEXOL 300 MG/ML  SOLN  COMPARISON:  Unenhanced CT of the chest earlier today at 737 hours and prior CT on  10/14/2012.  FINDINGS: CT CHEST FINDINGS  Geographic area of peripheral consolidation in the lateral and posterior right upper lobe spans over a region measuring roughly 3.7 x 7.3 x 4.4 cm. Areas of gas formation are consistent with necrosis and there is a small posterior component of focal fluid attenuation measuring roughly 1.5 cm in diameter. Based on the contrast-enhanced CT, this may represent a necrotic pneumonia and is not necessarily representative of malignancy. The small area of liquefaction may represent early focal abscess formation. Additional ground-glass and alveolar opacity in the anterior right upper lobe and right middle lobe likely represent acute infection and extension  of infiltrate. Clinical and imaging follow-up is recommended. If the process does not resolve, or worsens on antibiotic therapy, Pulmonary Medicine evaluation is recommended.  The area of mass-like opacity measured in the anterior aspect of the lateral left lower lobe is again visualized. This is also somewhat geographic in appearance and may not represent a focal mass. Focal atelectasis and scarring is possible. Malignancy is not completely excluded. A 6-7 mm anterior right upper lobe nodule is stable since 2014. A 5 mm anterior left upper lobe nodule is stable since 2014. A 5 mm inferior lingular nodule is stable since 2014. 4 mm subpleural nodule in the right middle lobe is stable since 2014. There are 2 tiny subpleural nodules in the posterior right lower lobe. One of these located more medially was not as apparent on the 2014 study. Tiny lateral subpleural nodules in the right lower lobe appears stable since the prior study in 2014.  No airway obstruction. Mildly prominent right hilar lymph node measures approximately 11 mm in short axis. There are a few tiny additional mediastinal nodes with no enlarged lymph nodes seen. No pleural or pericardial fluid is seen. The heart size is normal. Mild spondylosis present of the  thoracic spine.  CT ABDOMEN AND PELVIS FINDINGS  Solid organs show no evidence of metastatic disease. The liver, pancreas, spleen, adrenal glands and kidneys are normal. There is a small hiatal hernia. The gallbladder has been removed. No enlarged lymph nodes are seen.  Bowel is unremarkable and shows no evidence of obstruction or lesion. No free fluid, free air or abscess is identified. Small right inguinal hernia contains fat. The bladder is unremarkable. The uterus has been removed. The abdominal aorta and iliac arteries show atherosclerotic calcifications without aneurysmal disease or significant obstructive disease identified. No bony lesions are identified.  IMPRESSION: 1. The geographic area of peripheral consolidation in the right upper lobe has an appearance most likely consistent with necrotic pneumonia with additional small early abscess formation measuring 1.5 cm within this region. Although malignancy cannot be excluded, necrotic pneumonia is favored. Additional alveolar and ground-glass infiltrate extends into the anterior right upper lobe and right middle lobe. If the process does not resolve or worsens, pulmonary Medicine evaluation is recommended. Associated 11 mm right hilar lymph node may be reactive or metastatic. 2. The peripheral lateral left lower lobe opacity may represent focal atelectasis and scarring. Malignancy cannot be excluded, however. Once the pneumonia of the right upper lobe has been adequately treated, if there is persistent opacity in the left lower lobe, PET scan evaluation may be helpful. 3. All of the subcentimeter small nodules identified bilaterally are largely peripheral/subpleural and are stable since the CT in 2014. One of the right lower lobe nodules may be new/more prominent but only measures a few mm in diameter. All of these tiny nodules are most likely postinflammatory and not representative of metastatic nodules. 4. No evidence of pathology in the abdomen or pelvis.  No acute findings or evidence of metastatic disease. Incidental small hiatal hernia and small right inguinal hernia containing fat.   Electronically Signed   By: Aletta Edouard M.D.   On: 01/22/2015 13:45     CBC  Recent Labs Lab 01/28/15 1016  WBC 8.2  HGB 9.2*  HCT 28.3*  PLT 349  MCV 94.7  MCH 30.8  MCHC 32.6  RDW 15.8*  LYMPHSABS 0.6*  MONOABS 0.3  EOSABS 0.0  BASOSABS 0.0    Chemistries   Recent Labs Lab 01/27/15 0915  01/30/15 0158 01/31/15 0418  CREATININE 0.79 1.15* 1.07*   ------------------------------------------------------------------------------------------------------------------ estimated creatinine clearance is 48.9 mL/min (by C-G formula based on Cr of 1.07). ------------------------------------------------------------------------------------------------------------------ No results for input(s): HGBA1C in the last 72 hours. ------------------------------------------------------------------------------------------------------------------ No results for input(s): CHOL, HDL, LDLCALC, TRIG, CHOLHDL, LDLDIRECT in the last 72 hours. ------------------------------------------------------------------------------------------------------------------ No results for input(s): TSH, T4TOTAL, T3FREE, THYROIDAB in the last 72 hours.  Invalid input(s): FREET3 ------------------------------------------------------------------------------------------------------------------ No results for input(s): VITAMINB12, FOLATE, FERRITIN, TIBC, IRON, RETICCTPCT in the last 72 hours.  Coagulation profile No results for input(s): INR, PROTIME in the last 168 hours.  No results for input(s): DDIMER in the last 72 hours.  Cardiac Enzymes No results for input(s): CKMB, TROPONINI, MYOGLOBIN in the last 168 hours.  Invalid input(s): CK ------------------------------------------------------------------------------------------------------------------ Invalid input(s):  POCBNP    Assessment & Plan   Active Problems:   Necrotizing pneumonia IMPRESSION AND PLAN: Patient is a 59 year old African-American female with history of nicotine addiction, COPD presents with right-sided chest pain noted to have a mass as well as possible pneumonia.Intial ct scan with necrotizing pna, was treated with iv abx no improvement repeat ct showes worsening pna  #1.  Necorotizing pna-  , continue vanc and zosyn and clindamycin. Plan for bronch this week, to evaluate for fungal infection as well as malignancy  #2. COPD; there is no evidence of acute exacerbation continue her on Advair and Spiriva. And Combivent inhalers as needed.  #3. Peripheral vascular disease;   hold for biopsy #4. Nicotine addiction smoking cessation done     Code Status Orders        Start     Ordered   01/22/15 0947  Full code   Continuous     01/22/15 0948      Family Communication: Patient   Disposition Plan: Home   Procedures none   Consults pulmonary consult , ID consult   DVT Prophylaxis  SCDs  Lab Results  Component Value Date   PLT 349 01/28/2015     Time Spent in minutes 25 minutes   Dustin Flock M.D on 01/31/2015 at 10:01 AM  Between 7am to 6pm - Pager - [phone]  After 6pm go to [email or web address] - password [email or web address] [facility]   Office  [phone]

## 2015-01-31 NOTE — Plan of Care (Signed)
Problem: Discharge Progression Outcomes Goal: Discharge plan in place and appropriate Individualization  Patient prefers to be called [name], from home.          Current smoker.          Hx: COPD, HTN, MI, PVD - controlled with home medications.          Moderate Fall Risk. Pt shows understanding how to utilize call system for assistance.    Goal: Other Discharge Outcomes/Goals Outcome: Progressing Plan of care progress to goals:  1. Pain or c/o:  Denies pain.             2. Hemodynamic- afebrile this shift, IV antibiotic continue infusing per MD order.      3. No s/s of complications.       4. Diet: No c/o nausea/vomiting.   5. Activity: Independent. 6.Discharge plan: For possible bronchoscopy this week - per MD note.

## 2015-01-31 NOTE — Plan of Care (Signed)
Problem: Discharge Progression Outcomes Goal: Other Discharge Outcomes/Goals Outcome: Progressing Plan of care progress to goals:  1. Nonproductive cough noted. Robitussin/codeine syrup given for cough.             - Cepacol lozenges given for cough.     - Hydrocodone 2 tablets given for back pain.     - Improvement noted.      2. Hemodynamically     - Afebrile.       -Remains on IV antibiotics. Vancomycin, Zosyn and Cleocin.     -Remains on IVFs.     3. No s/s of complications.      4. -Tolerating 2-gram sodium diet. Decreased appetite. Supplemental drink ordered.     No c/o nausea or vomiting. 5. -Activity: Independent.     No fall or injuries noted this shift.

## 2015-02-01 ENCOUNTER — Inpatient Hospital Stay: Payer: Medicare Other

## 2015-02-01 LAB — CREATININE, SERUM
Creatinine, Ser: 1 mg/dL (ref 0.44–1.00)
GFR calc non Af Amer: >60 mL/min (ref >60)

## 2015-02-01 LAB — VANCOMYCIN, TROUGH: Vancomycin Tr: 12 ug/mL (ref 10–20)

## 2015-02-01 MED ORDER — LIDOCAINE HCL 2 % EX GEL
1.0000 "application " | Freq: Once | CUTANEOUS | Status: DC
  Filled 2015-02-01: qty 5

## 2015-02-01 MED ORDER — BUTAMBEN-TETRACAINE-BENZOCAINE 2-2-14 % EX AERO
1.0000 | INHALATION_SPRAY | Freq: Once | CUTANEOUS | Status: DC
  Filled 2015-02-01: qty 20

## 2015-02-01 MED ORDER — SODIUM CHLORIDE 0.9 % IV SOLN
Freq: Once | INTRAVENOUS | Status: AC
  Administered 2015-02-01: 19:00:00 20 mL/h via INTRAVENOUS

## 2015-02-01 MED ORDER — VANCOMYCIN HCL IN DEXTROSE 1-5 GM/200ML-% IV SOLN
1000.0000 mg | Freq: Two times a day (BID) | INTRAVENOUS | Status: DC
  Administered 2015-02-01 – 2015-02-02 (×2): 1000 mg via INTRAVENOUS
  Filled 2015-02-01 (×4): qty 200

## 2015-02-01 MED ORDER — SODIUM CHLORIDE 0.9 % IV SOLN: Freq: Once | INTRAVENOUS | Status: DC

## 2015-02-01 MED ORDER — PHENYLEPHRINE HCL 0.25 % NA SOLN
1.0000 | Freq: Four times a day (QID) | NASAL | Status: DC | PRN
  Filled 2015-02-01: qty 15

## 2015-02-01 NOTE — Progress Notes (Signed)
Pulmonary Critical Care  Progress Note   [name] QQP:[id number] DOB: [birth year] DOA: 01/22/2015  Referring physician: Dustin Flock, MD PCP: No primary care provider on file.     Subjective: she is continuing to improve today no hemoptysis is noted. She has no chest pain noted no fevers noted.  Review of Systems:   Remainder ROS performed and is unremarkable other than noted in HPI  Past Medical History  Diagnosis Date  . 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negative cardiac cath  . COPD (chronic obstructive pulmonary disease)   . Hypertension   . Peripheral vascular disease   . Nicotine addiction   . Asthma    Past Surgical History  Procedure Laterality Date  . Endovascular stent insertion Bilateral     Legs  . Cardiac surgery    . Hemorrhoid surgery     Social History:  reports that she has been smoking Cigarettes.  She has been smoking about 0.50 packs per day. She has never used smokeless tobacco. She reports that she drinks alcohol. She reports that she does not use illicit drugs.  Allergies  Allergen Reactions  . Aspirin Nausea And Vomiting and Other (See Comments)    Pt states aspirin makes her cramp and have to use coated kind.    Family History  Problem Relation Age of Onset  . Hypertension Mother   . Hypertension Father     Prior to Admission medications   Medication Sig Start Date End Date Taking? Authorizing Provider  aspirin EC 325 MG tablet Take 325 mg by mouth daily.   Yes Historical Provider, MD  atorvastatin (LIPITOR) 80 MG tablet Take 80 mg by mouth every morning.  11/15/14  Yes Historical Provider, MD  cetirizine (ZYRTEC) 10 MG tablet Take 10 mg by mouth daily.   Yes Historical Provider, MD  ferrous sulfate 325 (65 FE) MG tablet Take 325 mg by mouth daily.   Yes Historical Provider, MD  losartan (COZAAR) 50 MG tablet Take 50 mg by mouth daily. 12/09/14  Yes Historical Provider, MD  metoprolol (LOPRESSOR) 50 MG tablet Take 100 mg by mouth daily. 01/19/15   Yes Historical Provider, MD   Physical Exam: Filed Vitals:   01/31/15 2109 01/31/15 2111 02/01/15 0516 02/01/15 1003  BP: 134/54 138/59 135/56 113/41  Pulse: 70 70 73   Temp: 99 F (37.2 C)  98.3 F (36.8 C) 98.4 F (36.9 C)  TempSrc: Oral   Oral  Resp: 16  16 18   Height:      Weight:      SpO2: 93% 95% 94% 95%    Wt Readings from Last 3 Encounters:  01/22/15 57.267 kg (126 lb 4 oz)    General:  Appears calm and comfortable Eyes: PERRL, normal lids, irises & conjunctiva ENT: grossly normal hearing, lips & tongue Neck: no LAD, masses or thyromegaly Cardiovascular: RRR, no m/r/g. No LE edema. Respiratory: CTA bilaterally, no w/r/r. Normal respiratory effort. Abdomen: soft, nontender          Labs on Admission:  Basic Metabolic Panel:  Recent Labs Lab 01/27/15 0915 01/30/15 0158 01/31/15 0418 02/01/15 0452  CREATININE 0.79 1.15* 1.07* 1.00   Liver Function Tests: No results for input(s): AST, ALT, ALKPHOS, BILITOT, PROT, ALBUMIN in the last 168 hours. No results for input(s): LIPASE, AMYLASE in the last 168 hours. No results for input(s): AMMONIA in the last 168 hours. CBC:  Recent Labs Lab 01/28/15 1016  WBC 8.2  NEUTROABS 7.3*  HGB 9.2*  HCT 28.3*  MCV 94.7  PLT 349   Cardiac Enzymes: No results for input(s): CKTOTAL, CKMB, CKMBINDEX, TROPONINI in the last 168 hours.  BNP (last 3 results) No results for input(s): BNP in the last 8760 hours.  ProBNP (last 3 results) No results for input(s): PROBNP in the last 8760 hours.  CBG: No results for input(s): GLUCAP in the last 168 hours.  Radiological Exams on Admission: No results found.  EKG: Independently reviewed.  Assessment/Plan Active Problems:   Necrotizing pneumonia   1. Ncrotizing Pneumonia -will continue with antibiotics -I will plan on doing a bronchoscopy tomorrow -should be able to be discharged after this and follow up in office in 1-2 weeks    Time spent: 48min    I  have personally obtained a history, examined the patient, evaluated laboratory and imaging results, formulated the assessment and plan and placed orders.  The Patient requires high complexity decision making for assessment and support.    Allyne Gee, MD [facility] Pulmonary Critical Care Medicine Sleep Medicine

## 2015-02-01 NOTE — Progress Notes (Signed)
ANTIBIOTIC CONSULT NOTE - FOLLOW UP  Pharmacy Consult for Vancomycin Indication: pneumonia  Allergies  Allergen Reactions  . Aspirin Nausea And Vomiting and Other (See Comments)    Pt states aspirin makes her cramp and have to use coated kind.    Patient Measurements: Height: 5\' 4"  (162.6 cm) Weight: 126 lb 4 oz (57.267 kg) IBW/kg (Calculated) : 54.7   Vital Signs: Temp: 98.4 F (36.9 C) (05/31 2105) Temp Source: Oral (05/31 2105) BP: 131/57 mmHg (05/31 2105) Pulse Rate: 73 (05/31 2105) Intake/Output from previous day: 05/30 0701 - 05/31 0700 In: 640 [P.O.:240; IV Piggyback:400] Out: -  Intake/Output from this shift:    Labs:  Recent Labs  01/30/15 0158 01/31/15 0418 02/01/15 0452  CREATININE 1.15* 1.07* 1.00   Estimated Creatinine Clearance: 52.3 mL/min (by C-G formula based on Cr of 1).  Recent Labs  01/30/15 1405 02/01/15 2116  [place] 14 12     Microbiology: Recent Results (from the past 720 hour(s))  Culture, blood (routine x 2)     Status: None   Collection Time: 01/22/15  9:02 AM  Result Value Ref Range Status   Specimen Description BLOOD  Final   Special Requests NONE  Final   Culture NO GROWTH 5 DAYS  Final   Report Status 01/27/2015 FINAL  Final  Culture, blood (routine x 2)     Status: None   Collection Time: 01/22/15  9:02 AM  Result Value Ref Range Status   Specimen Description BLOOD  Final   Special Requests NONE  Final   Culture NO GROWTH 5 DAYS  Final   Report Status 01/27/2015 FINAL  Final  Culture, sputum-assessment     Status: None   Collection Time: 01/25/15  2:47 AM  Result Value Ref Range Status   Specimen Description SPU  Final   Special Requests NONE  Final   Sputum evaluation   Final    THIS SPECIMEN IS ACCEPTABLE FOR SPUTUM CULTURE CTJ 01/25/15    Report Status 01/25/2015 FINAL  Final  Culture, respiratory (NON-Expectorated)     Status: None (Preliminary result)   Collection Time: 01/25/15  2:47 AM  Result Value  Ref Range Status   Specimen Description SPU  Final   Special Requests NONE Reflexed from [id number]  Final   Gram Stain PENDING  Incomplete   Culture APPEARS TO NORMAL FLORA PRESENT  Final   Report Status PENDING  Incomplete    Anti-infectives    Start     Dose/Rate Route Frequency Ordered Stop   02/01/15 2300  vancomycin (VANCOCIN) IVPB 1000 mg/200 mL premix     1,000 mg 200 mL/hr over 60 Minutes Intravenous Every 12 hours 02/01/15 2224     01/30/15 1600  vancomycin (VANCOCIN) 1,250 mg in sodium chloride 0.9 % 250 mL IVPB  Status:  Discontinued     1,250 mg 166.7 mL/hr over 90 Minutes Intravenous Every 18 hours 01/30/15 0346 02/01/15 2224   01/30/15 0330  vancomycin (VANCOCIN) 1,250 mg in sodium chloride 0.9 % 250 mL IVPB  Status:  Discontinued     1,250 mg 166.7 mL/hr over 90 Minutes Intravenous Every 18 hours 01/30/15 0321 01/30/15 0346   01/28/15 2200  piperacillin-tazobactam (ZOSYN) IVPB 3.375 g     3.375 g 12.5 mL/hr over 240 Minutes Intravenous 3 times per day 01/28/15 1401     01/28/15 1600  clindamycin (CLEOCIN) IVPB 600 mg     600 mg 100 mL/hr over 30 Minutes Intravenous Every 6 hours 01/28/15  1325     01/28/15 1430  vancomycin (VANCOCIN) 1,250 mg in sodium chloride 0.9 % 250 mL IVPB  Status:  Discontinued     1,250 mg 166.7 mL/hr over 90 Minutes Intravenous Every 12 hours 01/28/15 1352 01/28/15 1354   01/28/15 1430  vancomycin (VANCOCIN) 1,500 mg in sodium chloride 0.9 % 500 mL IVPB  Status:  Discontinued     1,500 mg 250 mL/hr over 120 Minutes Intravenous Every 12 hours 01/28/15 1354 01/30/15 0321   01/28/15 1330  piperacillin-tazobactam (ZOSYN) IVPB 3.375 g  Status:  Discontinued     3.375 g 12.5 mL/hr over 240 Minutes Intravenous 4 times per day 01/28/15 1325 01/28/15 1401   01/27/15 2200  amoxicillin-clavulanate (AUGMENTIN) 875-125 MG per tablet 1 tablet  Status:  Discontinued     1 tablet Oral Every 12 hours 01/27/15 1702 01/28/15 1324   01/26/15 0630  vancomycin  (VANCOCIN) 1,250 mg in sodium chloride 0.9 % 250 mL IVPB  Status:  Discontinued     1,250 mg 166.7 mL/hr over 90 Minutes Intravenous Every 12 hours 01/26/15 0603 01/28/15 0906   01/22/15 1600  piperacillin-tazobactam (ZOSYN) IVPB 3.375 g  Status:  Discontinued     3.375 g 12.5 mL/hr over 240 Minutes Intravenous 3 times per day 01/22/15 1352 01/27/15 1702   01/22/15 1430  azithromycin (ZITHROMAX) 500 mg in dextrose 5 % 250 mL IVPB     500 mg 250 mL/hr over 60 Minutes Intravenous  Once 01/22/15 1351 01/22/15 1606   01/22/15 1400  piperacillin-tazobactam (ZOSYN) IVPB 3.375 g  Status:  Discontinued     3.375 g 12.5 mL/hr over 240 Minutes Intravenous 3 times per day 01/22/15 0951 01/22/15 1352   01/22/15 1200  vancomycin (VANCOCIN) IVPB 1000 mg/200 mL premix  Status:  Discontinued     1,000 mg 200 mL/hr over 60 Minutes Intravenous Every 18 hours 01/22/15 1124 01/26/15 0603   01/22/15 1000  vancomycin (VANCOCIN) IVPB 1000 mg/200 mL premix  Status:  Discontinued     1,000 mg 200 mL/hr over 60 Minutes Intravenous Every 24 hours 01/22/15 0951 01/22/15 1124   01/22/15 0845  cefTRIAXone (ROCEPHIN) 1 g in dextrose 5 % 50 mL IVPB     1 g 100 mL/hr over 30 Minutes Intravenous  Once 01/22/15 0843 01/22/15 0931   01/22/15 0845  azithromycin (ZITHROMAX) 500 mg in dextrose 5 % 250 mL IVPB  Status:  Discontinued     500 mg 250 mL/hr over 60 Minutes Intravenous  Once 01/22/15 0843 01/22/15 1351      Assessment: Vancomycin dosing of 1250 mg iv q 18 h and trough of 12 is below goal.   Goal of Therapy:  Vancomycin trough level 15-20 mcg/ml  Plan:  Will increase vancomycin to 1000 mg iv q 12 h and check a trough with the 4th dose.   Ulice Dash D 02/01/2015,10:26 PM

## 2015-02-01 NOTE — Plan of Care (Signed)
Problem: Discharge Progression Outcomes Goal: Tolerating diet Outcome: Not Applicable Date Met:  02/01/15 npo Goal: Other Discharge Outcomes/Goals Plan of care progress to goals:  1. Nonproductive cough noted. Robitussin/codeine syrup given for cough.                - Hydrocodone 2 tablets given for back pain.     - Improvement noted.      2. Hemodynamically     - Afebrile.       -Remains on IV antibiotics. Vancomycin, Zosyn and Cleocin.  3. No s/s of complications.      4. -Tolerating 2-gram sodium diet. Decreased appetite. Supplemental drink ordered.     No c/o nausea or vomiting. 5. -Activity: Independent.     No fall or injuries noted this shift.     

## 2015-02-01 NOTE — Plan of Care (Signed)
Problem: Discharge Progression Outcomes Goal: Other Discharge Outcomes/Goals Outcome: Progressing Goal: Other Discharge Outcomes/Goals Plan of care progress to goals:  1. Nonproductive cough noted. Robitussin/codeine syrup given for cough.                 - Hydrocodone 2 tablets given for back pain.with relief     - Improvement noted.       2. Hemodynamically     - Afebrile.        -Remains on IV antibiotics. Vancomycin, Zosyn and Cleocin.  3. No s/s of complications.       4. -Tolerating 2-gram sodium diet. Decreased appetite. Supplemental drink ordered.     No c/o nausea or vomiting. 5. -Activity: Independent.     No fall or injuries noted this shift.up to br and gait steady. Pt to be npo after mn for [employer].

## 2015-02-02 ENCOUNTER — Inpatient Hospital Stay: Payer: Medicare Other

## 2015-02-02 ENCOUNTER — Encounter
Admission: EM | Disposition: A | Discharge status: Home / Self Care | Payer: Self-pay | Source: Home / Self Care | Attending: Internal Medicine

## 2015-02-02 DIAGNOSIS — R0602 Shortness of breath: Secondary | ICD-10-CM | POA: Diagnosis not present

## 2015-02-02 HISTORY — PX: FLEXIBLE BRONCHOSCOPY: SHX5094

## 2015-02-02 SURGERY — BRONCHOSCOPY, FLEXIBLE
Anesthesia: Moderate Sedation

## 2015-02-02 MED ORDER — CIPROFLOXACIN HCL 500 MG PO TABS
500.0000 mg | ORAL_TABLET | Freq: Two times a day (BID) | ORAL | Status: DC
  Administered 2015-02-02 – 2015-02-04 (×4): 500 mg via ORAL
  Filled 2015-02-02 (×5): qty 1

## 2015-02-02 MED ORDER — CLINDAMYCIN HCL 150 MG PO CAPS
300.0000 mg | ORAL_CAPSULE | Freq: Three times a day (TID) | ORAL | Status: DC
  Administered 2015-02-02 – 2015-02-04 (×6): 300 mg via ORAL
  Filled 2015-02-02 (×6): qty 2

## 2015-02-02 MED ORDER — FENTANYL CITRATE (PF) 100 MCG/2ML IJ SOLN
INTRAMUSCULAR | Status: AC | PRN
  Administered 2015-02-02 (×4): 50 ug via INTRAVENOUS

## 2015-02-02 MED ORDER — MIDAZOLAM HCL 2 MG/2ML IJ SOLN
INTRAMUSCULAR | Status: AC | PRN
  Administered 2015-02-02 (×2): 1 mg via INTRAVENOUS
  Administered 2015-02-02: 13:00:00 2 mg via INTRAVENOUS
  Administered 2015-02-02: 13:00:00 1 mg via INTRAVENOUS

## 2015-02-02 MED ORDER — FENTANYL CITRATE (PF) 100 MCG/2ML IJ SOLN
INTRAMUSCULAR | Status: AC
  Filled 2015-02-02: qty 4

## 2015-02-02 MED ORDER — MIDAZOLAM HCL 5 MG/5ML IJ SOLN
INTRAMUSCULAR | Status: AC
  Filled 2015-02-02: qty 10

## 2015-02-02 NOTE — Plan of Care (Signed)
Problem: Discharge Progression Outcomes Goal: Other Discharge Outcomes/Goals Hemodynamically     - Afebrile.         -Antibiotics switched to po  No s/s of complications.         Tolerating 2-gram sodium diet. Decreased appetite. Supplemental drink ordered.     No c/o nausea or vomiting. Activity: Independent.     No fall or injuries noted this shift.up to br and gait steady. Bronchoscopy done with complications

## 2015-02-02 NOTE — Plan of Care (Signed)
Problem: Discharge Progression Outcomes Goal: Other Discharge Outcomes/Goals Hemodynamically     - Afebrile.         -Remains on IV antibiotics. Vancomycin, Zosyn and Cleocin.  No s/s of complications.         Tolerating 2-gram sodium diet. Decreased appetite. Supplemental drink ordered.     No c/o nausea or vomiting. Activity: Independent.     No fall or injuries noted this shift.up to br and gait steady. Pt NPO since MN for bronch today.

## 2015-02-02 NOTE — Progress Notes (Signed)
Medications delivered to pt in PACU as follows: Fentanyl 175mcg IV Dilaudid 2 mg IV  Scanner inoperable, problems documenting in timely fashion.

## 2015-02-02 NOTE — Procedures (Signed)
Please see full note in chart. Bronchoscopy performed with TBB and brushing. No mass noted await biopsy results and culture results. Patient may be discharged home and needs a follow up in the office in 1-2 weeks Please call [phone]   Allyne Gee, MD [facility] Pulmonary Critical Critical Care Medicine Sleep Medicine

## 2015-02-02 NOTE — Progress Notes (Signed)
[place] at [facility]                                                                                                                                                                                            Patient Demographics   [name], is a 59 y.o. female, DOB - 03/19/1956, JYN:[id number]  Admit date - 01/22/2015   Admitting Physician Dustin Flock, MD  Outpatient Primary MD for the patient is No primary care provider on file.  LOS - 52  Chief Complaint  Patient presents with  . Pleurisy   seen today,has cough,wants to know when she is having bronch.afebrile,    Review of Systems:   CONSTITUTIONAL: Positive fever. No fatigue, weakness. No weight gain, no weight loss.  EYES: No blurry or double vision.  ENT: No tinnitus. No postnasal drip. No redness of the oropharynx.  RESPIRATORY: Dry cough, no wheeze, no hemoptysis. Mild dyspnea.  CARDIOVASCULAR: Right-sided chest pain. No orthopnea. No palpitations. No syncope.  GASTROINTESTINAL: No nausea, no vomiting or diarrhea. No abdominal pain. No melena or hematochezia.  GENITOURINARY: No dysuria or hematuria.  ENDOCRINE: No polyuria or nocturia. No heat or cold intolerance.  HEMATOLOGY: No anemia. No bruising. No bleeding.  INTEGUMENTARY: No rashes. No lesions.  MUSCULOSKELETAL: No arthritis. No swelling. No gout.  NEUROLOGIC: No numbness, tingling, or ataxia. No seizure-type activity.  PSYCHIATRIC: No anxiety. No insomnia. No ADD.    Vitals:   Filed Vitals:   02/01/15 1003 02/01/15 1447 02/01/15 2105 02/02/15 0537  BP: 113/41 138/64 131/57 105/58  Pulse:  62 73 73  Temp: 98.4 F (36.9 C) 98.2 F (36.8 C) 98.4 F (36.9 C) 98.3 F (36.8 C)  TempSrc: Oral Oral Oral Oral  Resp: 18 18 18 18   Height:      Weight:      SpO2: 95% 96% 100% 97%    Wt Readings from Last 3 Encounters:  01/22/15 57.267 kg (126 lb 4 oz)     Intake/Output Summary (Last 24 hours) at 02/02/15  0848 Last data filed at 02/01/15 1822  Gross per 24 hour  Intake    960 ml  Output      0 ml  Net    960 ml    Physical Exam:   GENERAL: Pleasant-appearing in no apparent distress.  HEAD, EYES, EARS, NOSE AND THROAT: Atraumatic, normocephalic. Extraocular muscles are intact. Pupils equal and reactive to light. Sclerae anicteric. No conjunctival injection. No oro-pharyngeal erythema.  NECK: Supple. There is no jugular venous distention. No bruits, no lymphadenopathy, no thyromegaly.  HEART: Regular rate and rhythm, tachycardic. No  murmurs, no rubs, no clicks.  LUNGS: Occasional rhonchi. No rales or  No wheezes.  ABDOMEN: Soft, flat, nontender, nondistended. Has good bowel sounds. No hepatosplenomegaly appreciated.  EXTREMITIES: No evidence of any cyanosis, clubbing, or peripheral edema.  +2 pedal and radial pulses bilaterally.  NEUROLOGIC: The patient is alert, awake, and oriented x3 with no focal motor or sensory deficits appreciated bilaterally.  SKIN: Moist and warm with no rashes appreciated.  Psych: Not anxious, depressed LN: No inguinal LN enlargement    Antibiotics   Anti-infectives    Start     Dose/Rate Route Frequency Ordered Stop   02/01/15 2300  vancomycin (VANCOCIN) IVPB 1000 mg/200 mL premix     1,000 mg 200 mL/hr over 60 Minutes Intravenous Every 12 hours 02/01/15 2224     01/30/15 1600  vancomycin (VANCOCIN) 1,250 mg in sodium chloride 0.9 % 250 mL IVPB  Status:  Discontinued     1,250 mg 166.7 mL/hr over 90 Minutes Intravenous Every 18 hours 01/30/15 0346 02/01/15 2224   01/30/15 0330  vancomycin (VANCOCIN) 1,250 mg in sodium chloride 0.9 % 250 mL IVPB  Status:  Discontinued     1,250 mg 166.7 mL/hr over 90 Minutes Intravenous Every 18 hours 01/30/15 0321 01/30/15 0346   01/28/15 2200  piperacillin-tazobactam (ZOSYN) IVPB 3.375 g     3.375 g 12.5 mL/hr over 240 Minutes Intravenous 3 times per day 01/28/15 1401     01/28/15 1600  clindamycin (CLEOCIN) IVPB 600 mg      600 mg 100 mL/hr over 30 Minutes Intravenous Every 6 hours 01/28/15 1325     01/28/15 1430  vancomycin (VANCOCIN) 1,250 mg in sodium chloride 0.9 % 250 mL IVPB  Status:  Discontinued     1,250 mg 166.7 mL/hr over 90 Minutes Intravenous Every 12 hours 01/28/15 1352 01/28/15 1354   01/28/15 1430  vancomycin (VANCOCIN) 1,500 mg in sodium chloride 0.9 % 500 mL IVPB  Status:  Discontinued     1,500 mg 250 mL/hr over 120 Minutes Intravenous Every 12 hours 01/28/15 1354 01/30/15 0321   01/28/15 1330  piperacillin-tazobactam (ZOSYN) IVPB 3.375 g  Status:  Discontinued     3.375 g 12.5 mL/hr over 240 Minutes Intravenous 4 times per day 01/28/15 1325 01/28/15 1401   01/27/15 2200  amoxicillin-clavulanate (AUGMENTIN) 875-125 MG per tablet 1 tablet  Status:  Discontinued     1 tablet Oral Every 12 hours 01/27/15 1702 01/28/15 1324   01/26/15 0630  vancomycin (VANCOCIN) 1,250 mg in sodium chloride 0.9 % 250 mL IVPB  Status:  Discontinued     1,250 mg 166.7 mL/hr over 90 Minutes Intravenous Every 12 hours 01/26/15 0603 01/28/15 0906   01/22/15 1600  piperacillin-tazobactam (ZOSYN) IVPB 3.375 g  Status:  Discontinued     3.375 g 12.5 mL/hr over 240 Minutes Intravenous 3 times per day 01/22/15 1352 01/27/15 1702   01/22/15 1430  azithromycin (ZITHROMAX) 500 mg in dextrose 5 % 250 mL IVPB     500 mg 250 mL/hr over 60 Minutes Intravenous  Once 01/22/15 1351 01/22/15 1606   01/22/15 1400  piperacillin-tazobactam (ZOSYN) IVPB 3.375 g  Status:  Discontinued     3.375 g 12.5 mL/hr over 240 Minutes Intravenous 3 times per day 01/22/15 0951 01/22/15 1352   01/22/15 1200  vancomycin (VANCOCIN) IVPB 1000 mg/200 mL premix  Status:  Discontinued     1,000 mg 200 mL/hr over 60 Minutes Intravenous Every 18 hours 01/22/15 1124 01/26/15 0603   01/22/15  1000  vancomycin (VANCOCIN) IVPB 1000 mg/200 mL premix  Status:  Discontinued     1,000 mg 200 mL/hr over 60 Minutes Intravenous Every 24 hours 01/22/15 0951  01/22/15 1124   01/22/15 0845  cefTRIAXone (ROCEPHIN) 1 g in dextrose 5 % 50 mL IVPB     1 g 100 mL/hr over 30 Minutes Intravenous  Once 01/22/15 0843 01/22/15 0931   01/22/15 0845  azithromycin (ZITHROMAX) 500 mg in dextrose 5 % 250 mL IVPB  Status:  Discontinued     500 mg 250 mL/hr over 60 Minutes Intravenous  Once 01/22/15 0843 01/22/15 1351      Medications   Scheduled Meds: . sodium chloride   Intravenous Once  . atorvastatin  80 mg Oral [id number]  . butamben-tetracaine-benzocaine  1 spray Topical Once  . clindamycin (CLEOCIN) IV  600 mg Intravenous Q6H  . enoxaparin (LOVENOX) injection  40 mg Subcutaneous Q24H  . feeding supplement (ENSURE ENLIVE)  237 mL Oral TID WC  . ferrous sulfate  325 mg Oral Daily  . guaiFENesin  600 mg Oral BID  . lidocaine  1 application Topical Once  . losartan  50 mg Oral Daily  . metoprolol  100 mg Oral Daily  . piperacillin-tazobactam (ZOSYN)  IV  3.375 g Intravenous 3 times per day  . polyethylene glycol  17 g Oral Daily  . vancomycin  1,000 mg Intravenous Q12H  . zolpidem  5 mg Oral QHS   Continuous Infusions:   PRN Meds:.acetaminophen **OR** acetaminophen, calcium carbonate, guaiFENesin-codeine, HYDROcodone-acetaminophen, lactulose, menthol-cetylpyridinium, morphine injection, nicotine polacrilex, ondansetron **OR** ondansetron (ZOFRAN) IV, phenylephrine, senna-docusate, zolpidem   Data Review:   Micro Results Recent Results (from the past 240 hour(s))  Culture, sputum-assessment     Status: None   Collection Time: 01/25/15  2:47 AM  Result Value Ref Range Status   Specimen Description SPU  Final   Special Requests NONE  Final   Sputum evaluation   Final    THIS SPECIMEN IS ACCEPTABLE FOR SPUTUM CULTURE CTJ 01/25/15    Report Status 01/25/2015 FINAL  Final  Culture, respiratory (NON-Expectorated)     Status: None (Preliminary result)   Collection Time: 01/25/15  2:47 AM  Result Value Ref Range Status   Specimen Description SPU   Final   Special Requests NONE Reflexed from [id number]  Final   Gram Stain PENDING  Incomplete   Culture APPEARS TO NORMAL FLORA PRESENT  Final   Report Status PENDING  Incomplete    Radiology Reports Dg Chest 2 View  02/01/2015   CLINICAL DATA:  Follow-up pneumonia. Cough, congestion and chest pain.  EXAM: CHEST  2 VIEW  COMPARISON:  01/28/2015 CT and prior exams.  FINDINGS: Mild cardiomegaly again noted.  Right upper lobe consolidation/airspace disease has improved.  Emphysema/COPD again noted  There is no evidence of pneumothorax or pleural effusion.  No acute bony abnormalities are noted.  IMPRESSION: Improved right upper lobe consolidation/airspace disease compatible with pneumonia. Continued radiographic follow-up to resolution recommended.   Electronically Signed   By: Margarette Canada M.D.   On: 02/01/2015 20:46   Dg Chest 2 View  01/27/2015   CLINICAL DATA:  Shortness of breath and cough, followup  EXAM: CHEST  2 VIEW  COMPARISON:  CT chest of 01/22/2015 and chest x-ray of 01/22/2015  FINDINGS: There does appear to have been some interval worsening of the consolidation of a portion of the right upper lobe with areas of cavitation suggesting necrotic pneumonia. Vague opacity at  the left costophrenic angle may represent scarring or patchy pneumonia as well. No definite effusion is seen. Heart size is normal. No bony abnormality is seen.  IMPRESSION: Perhaps slight worsening of right upper lobe opacity most consistent with necrotic pneumonia with areas of apparent cavitation.   Electronically Signed   By: Ivar Drape M.D.   On: 01/27/2015 08:05   Dg Chest 2 View  01/22/2015   CLINICAL DATA:  Pleurisy. Right-sided pain, back pain for 1 week. History of COPD.  EXAM: CHEST  2 VIEW  COMPARISON:  10/15/2013  FINDINGS: Within the posterior right upper lobe is a a 4.7 cm rounded masslike opacity, with surrounding ill-defined density. Additional ill-defined density in the right middle lobe. Underlying emphysema  again seen. Probable atelectasis or scarring at the left costophrenic angle. The heart size and mediastinal contours are normal, mild atherosclerosis of the thoracic aorta. No pleural effusion. No pneumothorax. There is degenerative change in the spine.  IMPRESSION: Rounded 4.7 since masslike opacity in the right upper lobe, with surrounding ill-defined density. This may reflect pneumonia versus neoplasm. There is additional opacity in the right middle lobe concerning for pneumonia.  Chest CT correlation versus radiographic follow-up recommended. While traditional follow-up for pneumonia would be in 3-4 weeks after course of antibiotics, given the masslike appearance, shorter interval follow-up is recommended in 1-2 weeks.   Electronically Signed   By: Jeb Levering M.D.   On: 01/22/2015 06:42   Ct Chest Wo Contrast  01/22/2015   CLINICAL DATA:  Five day history of right-sided chest pain, worsening over the past 2 days. Right upper lobe lung mass on earlier chest x-ray.  EXAM: CT CHEST WITHOUT CONTRAST  TECHNIQUE: Multidetector CT imaging of the chest was performed following the standard protocol without IV contrast.  COMPARISON:  CT chest 10/14/2012. Two-view chest x-ray earlier same date.  FINDINGS: Lack of intravenous contrast makes the examination less sensitive than it would be otherwise. Severe emphysematous changes throughout both lungs with dense masslike opacity in the right upper lobe. Gas bubbles within the opacity. Extensive bullous changes are present in the right upper lobe in the area of the opacity. Patchy airspace opacities are present more inferiorly in the right lower lobe. Minimal patchy airspace opacities anteriorly in the right middle lobe. Masslike pleural-based opacity in the inferior left lower lobe measuring approximately 1.8 x 2.1 x 2.0 cm.  Multiple bilateral lung nodules, many of which are new since the prior CT. The largest nodule is in the inferior left upper lobe and measures  approximately 5 mm (series 3, image 30).  No associated pleural effusions.  No pleural masses.  Normal heart size with extensive 3 vessel coronary atherosclerosis. Severe atherosclerosis involving the thoracic and upper abdominal aorta and their visualized branches without evidence of aneurysm. No visible mediastinal, hilar or axillary lymphadenopathy. Visualized thyroid gland unremarkable.  Visualized upper abdomen unremarkable. 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  IMPRESSION: 1. 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 2. Approximate 2 cm mass in the inferior left lower lobe. 3. Numerous bilateral pulmonary nodules, the largest approximating 5 mm in the left lower lobe. 4. Severe COPD/emphysema. 5. No visible mediastinal, hilar or axillary lymphadenopathy, allowing for the unenhanced technique.   Electronically Signed   By: Evangeline Dakin M.D.   On: 01/22/2015 08:29   Ct Chest W Contrast  01/28/2015   CLINICAL DATA:  Followup right upper lobe lung process.  EXAM: CT CHEST WITH CONTRAST  TECHNIQUE: Multidetector CT imaging of the chest was performed during intravenous contrast administration.  CONTRAST:  46mL OMNIPAQUE IOHEXOL 300 MG/ML  SOLN  COMPARISON:  CT scans 01/22/2015  FINDINGS: Chest wall: No breast masses, supraclavicular or axillary lymphadenopathy. The thyroid gland appears normal. The bony thorax is intact. No destructive bone lesions or spinal canal compromise.  Mediastinum: The heart is normal in size. No pericardial effusion. Stable advanced atherosclerotic calcifications involving the aorta and branch vessels including the coronary arteries. The esophagus is grossly normal.  Stable small mediastinal lymph nodes. The right hilar node has enlarged slightly. It measures 20 x 21 mm on image number 25.  Lungs/pleura: There is a persistent and  progressive necrotic appearing right upper lobe pneumonia. It previously measured a maximum of 7.3 x 2.8 cm and now measures 10.7 x 5.8 cm. Small abscesses are suspected. I do not see the discrete rim enhancing small abscess opposed demonstrated on the prior scan. There is also severe surrounding inflammation in the right lung with marked interstitial thickening and ground-glass opacity. I do not see a discrete mass or endobronchial lesion to suggest neoplasm.  There are numerous small bilateral pulmonary nodules which are unchanged. The nodular density at the left lung base adjacent to the major fissure in the left lower lobe measures approximately 11 mm. Recommend continued observation.  Upper abdomen:  No significant findings.  IMPRESSION: 1. Worsening necrotic appearing right upper lobe pneumonia with possible small abscesses. Surrounding pneumonitis is also progressive. 2. Slight interval enlargement of right hilar lymph nodes. 3. Stable bilateral pulmonary nodules. 4. Bronchoscopy may be helpful for further evaluation. Repeat chest CT after appropriate antibiotic therapy is recommended.   Electronically Signed   By: Marijo Sanes M.D.   On: 01/28/2015 10:29   Ct Chest W Contrast  01/22/2015   CLINICAL DATA:  Right upper lobe lung mass with productive cough and chest pain.  EXAM: CT CHEST, ABDOMEN, AND PELVIS WITH CONTRAST  TECHNIQUE: Multidetector CT imaging of the chest, abdomen and pelvis was performed following the standard protocol during bolus administration of intravenous contrast.  CONTRAST:  162mL OMNIPAQUE IOHEXOL 300 MG/ML  SOLN  COMPARISON:  Unenhanced CT of the chest earlier today at 737 hours and prior CT on 10/14/2012.  FINDINGS: CT CHEST FINDINGS  Geographic area of peripheral consolidation in the lateral and posterior right upper lobe spans over a region measuring roughly 3.7 x 7.3 x 4.4 cm. Areas of gas formation are consistent with necrosis and there is a small posterior component of focal  fluid attenuation measuring roughly 1.5 cm in diameter. Based on the contrast-enhanced CT, this may represent a necrotic pneumonia and is not necessarily representative of malignancy. The small area of liquefaction may represent early focal abscess formation. Additional ground-glass and alveolar opacity in the anterior right upper lobe and right middle lobe likely represent acute infection and extension of infiltrate. Clinical and imaging follow-up is recommended. If the process does not resolve, or worsens on antibiotic therapy, Pulmonary Medicine evaluation is recommended.  The area of mass-like opacity measured in the anterior aspect of the lateral left lower lobe is again visualized. This is also somewhat geographic in appearance and may not represent a focal mass. Focal atelectasis and scarring is possible. Malignancy is not completely excluded. A 6-7 mm anterior right upper lobe nodule is stable since 2014. A 5 mm anterior left upper lobe nodule is stable since 2014. A  5 mm inferior lingular nodule is stable since 2014. 4 mm subpleural nodule in the right middle lobe is stable since 2014. There are 2 tiny subpleural nodules in the posterior right lower lobe. One of these located more medially was not as apparent on the 2014 study. Tiny lateral subpleural nodules in the right lower lobe appears stable since the prior study in 2014.  No airway obstruction. Mildly prominent right hilar lymph node measures approximately 11 mm in short axis. There are a few tiny additional mediastinal nodes with no enlarged lymph nodes seen. No pleural or pericardial fluid is seen. The heart size is normal. Mild spondylosis present of the thoracic spine.  CT ABDOMEN AND PELVIS FINDINGS  Solid organs show no evidence of metastatic disease. The liver, pancreas, spleen, adrenal glands and kidneys are normal. There is a small hiatal hernia. The gallbladder has been removed. No enlarged lymph nodes are seen.  Bowel is unremarkable and  shows no evidence of obstruction or lesion. No free fluid, free air or abscess is identified. Small right inguinal hernia contains fat. The bladder is unremarkable. The uterus has been removed. The abdominal aorta and iliac arteries show atherosclerotic calcifications without aneurysmal disease or significant obstructive disease identified. No bony lesions are identified.  IMPRESSION: 1. The geographic area of peripheral consolidation in the right upper lobe has an appearance most likely consistent with necrotic pneumonia with additional small early abscess formation measuring 1.5 cm within this region. Although malignancy cannot be excluded, necrotic pneumonia is favored. Additional alveolar and ground-glass infiltrate extends into the anterior right upper lobe and right middle lobe. If the process does not resolve or worsens, pulmonary Medicine evaluation is recommended. Associated 11 mm right hilar lymph node may be reactive or metastatic. 2. The peripheral lateral left lower lobe opacity may represent focal atelectasis and scarring. Malignancy cannot be excluded, however. Once the pneumonia of the right upper lobe has been adequately treated, if there is persistent opacity in the left lower lobe, PET scan evaluation may be helpful. 3. All of the subcentimeter small nodules identified bilaterally are largely peripheral/subpleural and are stable since the CT in 2014. One of the right lower lobe nodules may be new/more prominent but only measures a few mm in diameter. All of these tiny nodules are most likely postinflammatory and not representative of metastatic nodules. 4. No evidence of pathology in the abdomen or pelvis. No acute findings or evidence of metastatic disease. Incidental small hiatal hernia and small right inguinal hernia containing fat.   Electronically Signed   By: Aletta Edouard M.D.   On: 01/22/2015 13:45   Ct Abdomen Pelvis W Contrast  01/22/2015   CLINICAL DATA:  Right upper lobe lung mass  with productive cough and chest pain.  EXAM: CT CHEST, ABDOMEN, AND PELVIS WITH CONTRAST  TECHNIQUE: Multidetector CT imaging of the chest, abdomen and pelvis was performed following the standard protocol during bolus administration of intravenous contrast.  CONTRAST:  173mL OMNIPAQUE IOHEXOL 300 MG/ML  SOLN  COMPARISON:  Unenhanced CT of the chest earlier today at 737 hours and prior CT on 10/14/2012.  FINDINGS: CT CHEST FINDINGS  Geographic area of peripheral consolidation in the lateral and posterior right upper lobe spans over a region measuring roughly 3.7 x 7.3 x 4.4 cm. Areas of gas formation are consistent with necrosis and there is a small posterior component of focal fluid attenuation measuring roughly 1.5 cm in diameter. Based on the contrast-enhanced CT, this may represent a necrotic pneumonia  and is not necessarily representative of malignancy. The small area of liquefaction may represent early focal abscess formation. Additional ground-glass and alveolar opacity in the anterior right upper lobe and right middle lobe likely represent acute infection and extension of infiltrate. Clinical and imaging follow-up is recommended. If the process does not resolve, or worsens on antibiotic therapy, Pulmonary Medicine evaluation is recommended.  The area of mass-like opacity measured in the anterior aspect of the lateral left lower lobe is again visualized. This is also somewhat geographic in appearance and may not represent a focal mass. Focal atelectasis and scarring is possible. Malignancy is not completely excluded. A 6-7 mm anterior right upper lobe nodule is stable since 2014. A 5 mm anterior left upper lobe nodule is stable since 2014. A 5 mm inferior lingular nodule is stable since 2014. 4 mm subpleural nodule in the right middle lobe is stable since 2014. There are 2 tiny subpleural nodules in the posterior right lower lobe. One of these located more medially was not as apparent on the 2014 study. Tiny  lateral subpleural nodules in the right lower lobe appears stable since the prior study in 2014.  No airway obstruction. Mildly prominent right hilar lymph node measures approximately 11 mm in short axis. There are a few tiny additional mediastinal nodes with no enlarged lymph nodes seen. No pleural or pericardial fluid is seen. The heart size is normal. Mild spondylosis present of the thoracic spine.  CT ABDOMEN AND PELVIS FINDINGS  Solid organs show no evidence of metastatic disease. The liver, pancreas, spleen, adrenal glands and kidneys are normal. There is a small hiatal hernia. The gallbladder has been removed. No enlarged lymph nodes are seen.  Bowel is unremarkable and shows no evidence of obstruction or lesion. No free fluid, free air or abscess is identified. Small right inguinal hernia contains fat. The bladder is unremarkable. The uterus has been removed. The abdominal aorta and iliac arteries show atherosclerotic calcifications without aneurysmal disease or significant obstructive disease identified. No bony lesions are identified.  IMPRESSION: 1. The geographic area of peripheral consolidation in the right upper lobe has an appearance most likely consistent with necrotic pneumonia with additional small early abscess formation measuring 1.5 cm within this region. Although malignancy cannot be excluded, necrotic pneumonia is favored. Additional alveolar and ground-glass infiltrate extends into the anterior right upper lobe and right middle lobe. If the process does not resolve or worsens, pulmonary Medicine evaluation is recommended. Associated 11 mm right hilar lymph node may be reactive or metastatic. 2. The peripheral lateral left lower lobe opacity may represent focal atelectasis and scarring. Malignancy cannot be excluded, however. Once the pneumonia of the right upper lobe has been adequately treated, if there is persistent opacity in the left lower lobe, PET scan evaluation may be helpful. 3. All  of the subcentimeter small nodules identified bilaterally are largely peripheral/subpleural and are stable since the CT in 2014. One of the right lower lobe nodules may be new/more prominent but only measures a few mm in diameter. All of these tiny nodules are most likely postinflammatory and not representative of metastatic nodules. 4. No evidence of pathology in the abdomen or pelvis. No acute findings or evidence of metastatic disease. Incidental small hiatal hernia and small right inguinal hernia containing fat.   Electronically Signed   By: Aletta Edouard M.D.   On: 01/22/2015 13:45     CBC  Recent Labs Lab 01/28/15 1016  WBC 8.2  HGB 9.2*  HCT  28.3*  PLT 349  MCV 94.7  MCH 30.8  MCHC 32.6  RDW 15.8*  LYMPHSABS 0.6*  MONOABS 0.3  EOSABS 0.0  BASOSABS 0.0    Chemistries   Recent Labs Lab 01/27/15 0915 01/30/15 0158 01/31/15 0418 02/01/15 0452  CREATININE 0.79 1.15* 1.07* 1.00   ------------------------------------------------------------------------------------------------------------------ estimated creatinine clearance is 52.3 mL/min (by C-G formula based on Cr of 1). ------------------------------------------------------------------------------------------------------------------ No results for input(s): HGBA1C in the last 72 hours. ------------------------------------------------------------------------------------------------------------------ No results for input(s): CHOL, HDL, LDLCALC, TRIG, CHOLHDL, LDLDIRECT in the last 72 hours. ------------------------------------------------------------------------------------------------------------------ No results for input(s): TSH, T4TOTAL, T3FREE, THYROIDAB in the last 72 hours.  Invalid input(s): FREET3 ------------------------------------------------------------------------------------------------------------------ No results for input(s): VITAMINB12, FOLATE, FERRITIN, TIBC, IRON, RETICCTPCT in the last 72  hours.  Coagulation profile No results for input(s): INR, PROTIME in the last 168 hours.  No results for input(s): DDIMER in the last 72 hours.  Cardiac Enzymes No results for input(s): CKMB, TROPONINI, MYOGLOBIN in the last 168 hours.  Invalid input(s): CK ------------------------------------------------------------------------------------------------------------------ Invalid input(s): POCBNP    Assessment & Plan   Active Problems:   Necrotizing pneumonia IMPRESSION AND PLAN: Patient is a 59 year old African-American female with history of nicotine addiction, COPD presents with right-sided chest pain noted to have a mass as well as possible pneumonia.Intial ct scan with necrotizing pna, was treated with iv abx no improvement repeat ct showes worsening pna  #1.  Necorotizing pna-  , continue vanc and zosyn and clindamycin. Pulmonary consult  For possible bronch #2. COPD; there is no evidence of acute exacerbation continue her on Advair and Spiriva. And Combivent inhalers as needed.  #3. Peripheral vascular disease;   hold lovenox for biopsy,continue statins  #4. Nicotine addiction smoking cessation done 5.htn;controlled    Code Status Orders        Start     Ordered   01/22/15 0947  Full code   Continuous     01/22/15 0948      Family Communication: Patient   Disposition Plan: Home   Procedures none   Consults pulmonary consult , ID consult   DVT Prophylaxis  SCDs  Lab Results  Component Value Date   PLT 349 01/28/2015     Time Spent in minutes 15 minutes   Sosha Shepherd M.D on 02/02/2015 at 8:48 AM  Between 7am to 6pm - Pager - [phone]  After 6pm go to [email or web address] - password [email or web address] [facility]   Office  [phone]

## 2015-02-02 NOTE — Progress Notes (Signed)
Date of Admission:  01/22/2015   ID: [name] is a 59 y.o. female with  Cavitary pna  Active Problems:   Necrotizing pneumonia  Subjective: Afebrile. Bronch done today. Feels a lot better.   Medications:  . atorvastatin  80 mg Oral BH-q7a  . butamben-tetracaine-benzocaine  1 spray Topical Once  . clindamycin (CLEOCIN) IV  600 mg Intravenous Q6H  . enoxaparin (LOVENOX) injection  40 mg Subcutaneous Q24H  . feeding supplement (ENSURE ENLIVE)  237 mL Oral TID WC  . fentaNYL      . ferrous sulfate  325 mg Oral Daily  . guaiFENesin  600 mg Oral BID  . lidocaine  1 application Topical Once  . losartan  50 mg Oral Daily  . metoprolol  100 mg Oral Daily  . midazolam      . piperacillin-tazobactam (ZOSYN)  IV  3.375 g Intravenous 3 times per day  . polyethylene glycol  17 g Oral Daily  . vancomycin  1,000 mg Intravenous Q12H  . zolpidem  5 mg Oral QHS    Objective: Vital signs in last 24 hours: Temp:  [98.3 F (36.8 C)-99.4 F (37.4 C)] 99.4 F (37.4 C) (06/01 1118) Pulse Rate:  [67-103] 74 (06/01 1415) Resp:  [12-26] 25 (06/01 1415) BP: (98-153)/(55-87) 102/60 mmHg (06/01 1415) SpO2:  [95 %-100 %] 99 % (06/01 1415) GENERAL: lying in the bed with no acute distress, but diaphoretic EYES: Pupils equal, round, reactive to light and accommodation. No scleral icterus. Extraocular muscles intact.  HEENT: Head atraumatic, normocephalic. Oropharynx and nasopharynx clear.  NECK: Supple, no jugular venous distention. No thyroid enlargement, no tenderness.  LUNGS: Diminished breath sounds bilaterally, no wheezing, rales, positive rhonchi no crepitation. No use of accessory muscles of respiration.  CARDIOVASCULAR: S1, S2 normal. No murmurs, rubs, or gallops.  ABDOMEN: Soft, nontender, nondistended. Bowel sounds present. No organomegaly or mass.  EXTREMITIES: No pedal edema, cyanosis, or clubbing.  NEUROLOGIC: Cranial nerves II through XII are intact. Muscle strength 5/5 in  all extremities. Sensation intact. Gait not checked.  PSYCHIATRIC: The patient is alert and oriented x 3.  SKIN: No obvious rash, lesion, or ulcer  Lab Results  Recent Labs  01/31/15 0418 02/01/15 0452  CREATININE 1.07* 1.00   Microbiology: Results for orders placed or performed during the hospital encounter of 01/22/15  Culture, blood (routine x 2)     Status: None   Collection Time: 01/22/15  9:02 AM  Result Value Ref Range Status   Specimen Description BLOOD  Final   Special Requests NONE  Final   Culture NO GROWTH 5 DAYS  Final   Report Status 01/27/2015 FINAL  Final  Culture, blood (routine x 2)     Status: None   Collection Time: 01/22/15  9:02 AM  Result Value Ref Range Status   Specimen Description BLOOD  Final   Special Requests NONE  Final   Culture NO GROWTH 5 DAYS  Final   Report Status 01/27/2015 FINAL  Final  Culture, sputum-assessment     Status: None   Collection Time: 01/25/15  2:47 AM  Result Value Ref Range Status   Specimen Description SPU  Final   Special Requests NONE  Final   Sputum evaluation   Final    THIS SPECIMEN IS ACCEPTABLE FOR SPUTUM CULTURE CTJ 01/25/15    Report Status 01/25/2015 FINAL  Final  Culture, respiratory (NON-Expectorated)     Status: None (Preliminary result)   Collection Time: 01/25/15  2:47 AM  Result Value Ref Range Status   Specimen Description SPU  Final   Special Requests NONE Reflexed from [id number]  Final   Gram Stain PENDING  Incomplete   Culture APPEARS TO NORMAL FLORA PRESENT  Final   Report Status PENDING  Incomplete    Studies/Results: Dg Chest 2 View  02/02/2015   CLINICAL DATA:  Post bronchoscopy.  Biopsy.  EXAM: CHEST  2 VIEW  COMPARISON:  02/01/2015  FINDINGS: Large area of consolidation again noted in the right upper lobe. This is unchanged. No pneumothorax following bronchoscopy. Scarring or atelectasis at the left base. Heart is normal size. No effusions or acute bony abnormality.  IMPRESSION: No  pneumothorax following biopsy. Stable right upper lobe masslike area consolidation.   Electronically Signed   By: Rolm Baptise M.D.   On: 02/02/2015 14:48   Dg Chest 2 View  02/01/2015   CLINICAL DATA:  Follow-up pneumonia. Cough, congestion and chest pain.  EXAM: CHEST  2 VIEW  COMPARISON:  01/28/2015 CT and prior exams.  FINDINGS: Mild cardiomegaly again noted.  Right upper lobe consolidation/airspace disease has improved.  Emphysema/COPD again noted  There is no evidence of pneumothorax or pleural effusion.  No acute bony abnormalities are noted.  IMPRESSION: Improved right upper lobe consolidation/airspace disease compatible with pneumonia. Continued radiographic follow-up to resolution recommended.   Electronically Signed   By: Margarette Canada M.D.   On: 02/01/2015 20:46   Dg C-arm 1-60 Min-no Report  02/02/2015   CLINICAL DATA: Bronch   C-ARM 1-60 MINUTES  Fluoroscopy was utilized by the requesting physician.  No radiographic  interpretation.    Dg Chest 2 View  02/01/2015   CLINICAL DATA:  Follow-up pneumonia. Cough, congestion and chest pain.  EXAM: CHEST  2 VIEW  COMPARISON:  01/28/2015 CT and prior exams.  FINDINGS: Mild cardiomegaly again noted.  Right upper lobe consolidation/airspace disease has improved.  Emphysema/COPD again noted  There is no evidence of pneumothorax or pleural effusion.  No acute bony abnormalities are noted.  IMPRESSION: Improved right upper lobe consolidation/airspace disease compatible with pneumonia. Continued radiographic follow-up to resolution recommended.   Electronically Signed   By: Margarette Canada M.D.   On: 02/01/2015 20:46   Dg C-arm 1-60 Min-no Report  02/02/2015   CLINICAL DATA: Bronch   C-ARM 1-60 MINUTES  Fluoroscopy was utilized by the requesting physician.  No radiographic  interpretation.     Assessment/Plan: [name] is a 59 y.o. female hx of COPD, HTN PVD ongoing tobacco abuse admitted 5/21 with 2 weeks cough and pleurtiic chest pain. Found to have  R sided infiltrate vs mass. On further questioning reports 3 months of slowly progressive pleurtic pain. PPD is negative. HIV negative. QFG indtereminate.  CT shows impressive infiltrate, likely necrotizing PNA. Initial FU CT shows worsening but now cxr with some improvement. S/p Bronch 6/1 - results pending  AFB are negative x 3 and TB Xpert PCR neg as well - this makes TB very unlikley. Sputum culture is negative except for nml oral flora  Rec  Bronch samples sent for fungal afb and bacterial culture.  No evidence of malignancy per Dr Humphrey Rolls note She has a dense necrotizing type pna on imaging and it took a long time for her to clinically improve. Would rec dc on cipro 500 bid and clinda 300 tid for another 10  days (approx 3 weeks total antibiotic treatment since admit) Will need fu with pulmonary to ensure radiographic resolution.  I will check her  cultures in a few days but do not need to see her unless Dr Humphrey Rolls needs me to in follow up. [place], [place]   02/02/2015, 2:50 PM

## 2015-02-02 NOTE — Op Note (Signed)
[facility] Patient Name: [name] Procedure Date: 02/02/2015 11:38 AM MRN: [id number] Account #: [id number] Date of Birth: Jul 16, 1956 Admit Type: Outpatient Age: 59 Room: [address] on 2nd floor Gender: Female Note Status: Finalized Attending MD: Allyne Gee, MD Procedure:         Bronchoscopy Indications:       Atelectasis of the right upper lobe, Persistent atelectasis Providers:         Allyne Gee, MD, Eather Colas, Technician (Technician) Referring MD:       Medicines:         Midazolam 4 mg IV, Fentanyl 409 mcg IV Complications:     No immediate complications Procedure:         Pre-Anesthesia Assessment:                    - A History and Physical has been performed. The patient's                     medications, allergies and sensitivities have been                     reviewed.                    - The risks and benefits of the procedure and the sedation                     options and risks were discussed with the patient. All                     questions were answered and informed consent was obtained.                    - Pre-procedure physical examination revealed no                     contraindications to sedation.                    - ASA Grade Assessment: II - A patient with mild systemic                     disease.                    - After reviewing the risks and benefits, the patient was                     deemed in satisfactory condition to undergo the procedure.                    - The anesthesia plan was to use moderate                     sedation/analgesia.                    - Immediately prior to administration of medications, the                     patient was re-assessed for adequacy to receive sedatives.                    - The heart rate, respiratory rate, oxygen saturations,                     blood  pressure, adequacy of pulmonary ventilation, and                     response to care were monitored throughout the  procedure.                    - The physical status of the patient was re-assessed after                     the procedure.                    After obtaining informed consent, the bronchoscope was                     passed under direct vision. Throughout the procedure, the                     patient's blood pressure, pulse, and oxygen saturations                     were monitored continuously. the Bronchoscope Olympus                     BF-Q180 S# [id number] was introduced through the right                     nostril and advanced to the tracheobronchial tree of both                     lungs. The procedure was accomplished without difficulty.                     The patient tolerated the procedure well. The total                     duration of the procedure was 30 minutes. Findings:      The nasopharynx/oropharynx appears normal. The larynx appears normal.       The vocal cords move normally with phonation and breathing. The       subglottic space is normal. The trachea is of normal caliber. The carina       is sharp. The tracheobronchial tree was examined to at least the first       subsegmental level. Bronchial mucosa and anatomy are normal; there are       no endobronchial lesions, and no secretions.      Transbronchial biopsies were performed in the RUL anterior segment (B3)       of the lung using forceps and sent for routine cytology and       histopathology examination. The procedure was guided by fluoroscopy. One       biopsy pass was performed. One biopsy sample was obtained.      Fluoroscopically guided transbronchial brushings were obtained in the       right upper lobe of the lung and sent for cell count, bacterial culture,       viral smears & culture, and fungal & AFB analysis and cytology. Three       samples were obtained.      Verification of patient identification for the specimen was done by the       physician using the patient's name, birth date and medical record  number.      Washings were obtained in the right upper lobe of the lung and sent for  cell count, bacterial culture, viral smears & culture, and fungal & AFB       analysis and cytology. The return was cellular.      Verification of patient identification for the specimen was done by the       physician using the patient's name, birth date and medical record number. Impression:        - The examination was normal.                    - Transbronchial lung biopsies were performed.                    - Fluoroscopically guided transbronchial brushings were                     obtained.                    - Washings were obtained. Recommendation:    - Await biopsy, brushing, cytology and washing results. Devona Konig, MD Allyne Gee, MD 02/02/2015 1:35:48 PM This report has been signed electronically. Number of Addenda: 0 Note Initiated On: 02/02/2015 11:38 AM      [facility]

## 2015-02-02 NOTE — Progress Notes (Signed)
[place] at [facility]                                                                                                                                                                                            Patient Demographics   [name], is a 59 y.o. female, DOB - 1956/03/07, EXH:[id number]  Admit date - 01/22/2015   Admitting Physician Dustin Flock, MD  Outpatient Primary MD for the patient is No primary care provider on file.  LOS - 49  Chief Complaint  Patient presents with  . Pleurisy    Subjective: Patient seen today, complains of some cough. Going for bronchoscopy today. Denies any other complaints.  Review of Systems:   CONSTITUTIONAL: Positive fever. No fatigue, weakness. No weight gain, no weight loss.  EYES: No blurry or double vision.  ENT: No tinnitus. No postnasal drip. No redness of the oropharynx.  RESPIRATORY: Dry cough, no wheeze, no hemoptysis. Mild dyspnea.  CARDIOVASCULAR: Right-sided chest pain. No orthopnea. No palpitations. No syncope.  GASTROINTESTINAL: No nausea, no vomiting or diarrhea. No abdominal pain. No melena or hematochezia.  GENITOURINARY: No dysuria or hematuria.  ENDOCRINE: No polyuria or nocturia. No heat or cold intolerance.  HEMATOLOGY: No anemia. No bruising. No bleeding.  INTEGUMENTARY: No rashes. No lesions.  MUSCULOSKELETAL: No arthritis. No swelling. No gout.  NEUROLOGIC: No numbness, tingling, or ataxia. No seizure-type activity.  PSYCHIATRIC: No anxiety. No insomnia. No ADD.    Vitals:   Filed Vitals:   02/01/15 1447 02/01/15 2105 02/02/15 0537 02/02/15 1118  BP: 138/64 131/57 105/58 119/67  Pulse: 62 73 73   Temp: 98.2 F (36.8 C) 98.4 F (36.9 C) 98.3 F (36.8 C) 99.4 F (37.4 C)  TempSrc: Oral Oral Oral Oral  Resp: 18 18 18 18   Height:      Weight:      SpO2: 96% 100% 97% 98%    Wt Readings from Last 3 Encounters:  01/22/15 57.267 kg (126 lb 4 oz)      Intake/Output Summary (Last 24 hours) at 02/02/15 1207 Last data filed at 02/01/15 1822  Gross per 24 hour  Intake    480 ml  Output      0 ml  Net    480 ml    Physical Exam:   GENERAL: Pleasant-appearing in no apparent distress.  HEAD, EYES, EARS, NOSE AND THROAT: Atraumatic, normocephalic. Extraocular muscles are intact. Pupils equal and reactive to light. Sclerae anicteric. No conjunctival injection. No oro-pharyngeal erythema.  NECK: Supple. There is no jugular venous distention. No bruits, no lymphadenopathy, no thyromegaly.  HEART: Regular  rate and rhythm, tachycardic. No murmurs, no rubs, no clicks.  LUNGS: Occasional rhonchi. No rales or  No wheezes.  ABDOMEN: Soft, flat, nontender, nondistended. Has good bowel sounds. No hepatosplenomegaly appreciated.  EXTREMITIES: No evidence of any cyanosis, clubbing, or peripheral edema.  +2 pedal and radial pulses bilaterally.  NEUROLOGIC: The patient is alert, awake, and oriented x3 with no focal motor or sensory deficits appreciated bilaterally.  SKIN: Moist and warm with no rashes appreciated.  Psych: Not anxious, depressed LN: No inguinal LN enlargement    Antibiotics   Anti-infectives    Start     Dose/Rate Route Frequency Ordered Stop   02/01/15 2300  vancomycin (VANCOCIN) IVPB 1000 mg/200 mL premix     1,000 mg 200 mL/hr over 60 Minutes Intravenous Every 12 hours 02/01/15 2224     01/30/15 1600  vancomycin (VANCOCIN) 1,250 mg in sodium chloride 0.9 % 250 mL IVPB  Status:  Discontinued     1,250 mg 166.7 mL/hr over 90 Minutes Intravenous Every 18 hours 01/30/15 0346 02/01/15 2224   01/30/15 0330  vancomycin (VANCOCIN) 1,250 mg in sodium chloride 0.9 % 250 mL IVPB  Status:  Discontinued     1,250 mg 166.7 mL/hr over 90 Minutes Intravenous Every 18 hours 01/30/15 0321 01/30/15 0346   01/28/15 2200  piperacillin-tazobactam (ZOSYN) IVPB 3.375 g     3.375 g 12.5 mL/hr over 240 Minutes Intravenous 3 times per day 01/28/15  1401     01/28/15 1600  clindamycin (CLEOCIN) IVPB 600 mg     600 mg 100 mL/hr over 30 Minutes Intravenous Every 6 hours 01/28/15 1325     01/28/15 1430  vancomycin (VANCOCIN) 1,250 mg in sodium chloride 0.9 % 250 mL IVPB  Status:  Discontinued     1,250 mg 166.7 mL/hr over 90 Minutes Intravenous Every 12 hours 01/28/15 1352 01/28/15 1354   01/28/15 1430  vancomycin (VANCOCIN) 1,500 mg in sodium chloride 0.9 % 500 mL IVPB  Status:  Discontinued     1,500 mg 250 mL/hr over 120 Minutes Intravenous Every 12 hours 01/28/15 1354 01/30/15 0321   01/28/15 1330  piperacillin-tazobactam (ZOSYN) IVPB 3.375 g  Status:  Discontinued     3.375 g 12.5 mL/hr over 240 Minutes Intravenous 4 times per day 01/28/15 1325 01/28/15 1401   01/27/15 2200  amoxicillin-clavulanate (AUGMENTIN) 875-125 MG per tablet 1 tablet  Status:  Discontinued     1 tablet Oral Every 12 hours 01/27/15 1702 01/28/15 1324   01/26/15 0630  vancomycin (VANCOCIN) 1,250 mg in sodium chloride 0.9 % 250 mL IVPB  Status:  Discontinued     1,250 mg 166.7 mL/hr over 90 Minutes Intravenous Every 12 hours 01/26/15 0603 01/28/15 0906   01/22/15 1600  piperacillin-tazobactam (ZOSYN) IVPB 3.375 g  Status:  Discontinued     3.375 g 12.5 mL/hr over 240 Minutes Intravenous 3 times per day 01/22/15 1352 01/27/15 1702   01/22/15 1430  azithromycin (ZITHROMAX) 500 mg in dextrose 5 % 250 mL IVPB     500 mg 250 mL/hr over 60 Minutes Intravenous  Once 01/22/15 1351 01/22/15 1606   01/22/15 1400  piperacillin-tazobactam (ZOSYN) IVPB 3.375 g  Status:  Discontinued     3.375 g 12.5 mL/hr over 240 Minutes Intravenous 3 times per day 01/22/15 0951 01/22/15 1352   01/22/15 1200  vancomycin (VANCOCIN) IVPB 1000 mg/200 mL premix  Status:  Discontinued     1,000 mg 200 mL/hr over 60 Minutes Intravenous Every 18 hours 01/22/15 1124  01/26/15 0603   01/22/15 1000  vancomycin (VANCOCIN) IVPB 1000 mg/200 mL premix  Status:  Discontinued     1,000 mg 200 mL/hr  over 60 Minutes Intravenous Every 24 hours 01/22/15 0951 01/22/15 1124   01/22/15 0845  cefTRIAXone (ROCEPHIN) 1 g in dextrose 5 % 50 mL IVPB     1 g 100 mL/hr over 30 Minutes Intravenous  Once 01/22/15 0843 01/22/15 0931   01/22/15 0845  azithromycin (ZITHROMAX) 500 mg in dextrose 5 % 250 mL IVPB  Status:  Discontinued     500 mg 250 mL/hr over 60 Minutes Intravenous  Once 01/22/15 0843 01/22/15 1351      Medications   Scheduled Meds: . sodium chloride   Intravenous Once  . atorvastatin  80 mg Oral [id number]  . butamben-tetracaine-benzocaine  1 spray Topical Once  . clindamycin (CLEOCIN) IV  600 mg Intravenous Q6H  . enoxaparin (LOVENOX) injection  40 mg Subcutaneous Q24H  . feeding supplement (ENSURE ENLIVE)  237 mL Oral TID WC  . fentaNYL      . ferrous sulfate  325 mg Oral Daily  . guaiFENesin  600 mg Oral BID  . lidocaine  1 application Topical Once  . losartan  50 mg Oral Daily  . metoprolol  100 mg Oral Daily  . midazolam      . piperacillin-tazobactam (ZOSYN)  IV  3.375 g Intravenous 3 times per day  . polyethylene glycol  17 g Oral Daily  . vancomycin  1,000 mg Intravenous Q12H  . zolpidem  5 mg Oral QHS   Continuous Infusions:   PRN Meds:.acetaminophen **OR** acetaminophen, calcium carbonate, guaiFENesin-codeine, HYDROcodone-acetaminophen, lactulose, menthol-cetylpyridinium, morphine injection, nicotine polacrilex, ondansetron **OR** ondansetron (ZOFRAN) IV, phenylephrine, senna-docusate, zolpidem   Data Review:   Micro Results Recent Results (from the past 240 hour(s))  Culture, sputum-assessment     Status: None   Collection Time: 01/25/15  2:47 AM  Result Value Ref Range Status   Specimen Description SPU  Final   Special Requests NONE  Final   Sputum evaluation   Final    THIS SPECIMEN IS ACCEPTABLE FOR SPUTUM CULTURE CTJ 01/25/15    Report Status 01/25/2015 FINAL  Final  Culture, respiratory (NON-Expectorated)     Status: None (Preliminary result)    Collection Time: 01/25/15  2:47 AM  Result Value Ref Range Status   Specimen Description SPU  Final   Special Requests NONE Reflexed from [id number]  Final   Gram Stain PENDING  Incomplete   Culture APPEARS TO NORMAL FLORA PRESENT  Final   Report Status PENDING  Incomplete    Radiology Reports Dg Chest 2 View  02/01/2015   CLINICAL DATA:  Follow-up pneumonia. Cough, congestion and chest pain.  EXAM: CHEST  2 VIEW  COMPARISON:  01/28/2015 CT and prior exams.  FINDINGS: Mild cardiomegaly again noted.  Right upper lobe consolidation/airspace disease has improved.  Emphysema/COPD again noted  There is no evidence of pneumothorax or pleural effusion.  No acute bony abnormalities are noted.  IMPRESSION: Improved right upper lobe consolidation/airspace disease compatible with pneumonia. Continued radiographic follow-up to resolution recommended.   Electronically Signed   By: Margarette Canada M.D.   On: 02/01/2015 20:46   Dg Chest 2 View  01/27/2015   CLINICAL DATA:  Shortness of breath and cough, followup  EXAM: CHEST  2 VIEW  COMPARISON:  CT chest of 01/22/2015 and chest x-ray of 01/22/2015  FINDINGS: There does appear to have been some interval worsening of the  consolidation of a portion of the right upper lobe with areas of cavitation suggesting necrotic pneumonia. Vague opacity at the left costophrenic angle may represent scarring or patchy pneumonia as well. No definite effusion is seen. Heart size is normal. No bony abnormality is seen.  IMPRESSION: Perhaps slight worsening of right upper lobe opacity most consistent with necrotic pneumonia with areas of apparent cavitation.   Electronically Signed   By: Ivar Drape M.D.   On: 01/27/2015 08:05   Dg Chest 2 View  01/22/2015   CLINICAL DATA:  Pleurisy. Right-sided pain, back pain for 1 week. History of COPD.  EXAM: CHEST  2 VIEW  COMPARISON:  10/15/2013  FINDINGS: Within the posterior right upper lobe is a a 4.7 cm rounded masslike opacity, with surrounding  ill-defined density. Additional ill-defined density in the right middle lobe. Underlying emphysema again seen. Probable atelectasis or scarring at the left costophrenic angle. The heart size and mediastinal contours are normal, mild atherosclerosis of the thoracic aorta. No pleural effusion. No pneumothorax. There is degenerative change in the spine.  IMPRESSION: Rounded 4.7 since masslike opacity in the right upper lobe, with surrounding ill-defined density. This may reflect pneumonia versus neoplasm. There is additional opacity in the right middle lobe concerning for pneumonia.  Chest CT correlation versus radiographic follow-up recommended. While traditional follow-up for pneumonia would be in 3-4 weeks after course of antibiotics, given the masslike appearance, shorter interval follow-up is recommended in 1-2 weeks.   Electronically Signed   By: Jeb Levering M.D.   On: 01/22/2015 06:42   Ct Chest Wo Contrast  01/22/2015   CLINICAL DATA:  Five day history of right-sided chest pain, worsening over the past 2 days. Right upper lobe lung mass on earlier chest x-ray.  EXAM: CT CHEST WITHOUT CONTRAST  TECHNIQUE: Multidetector CT imaging of the chest was performed following the standard protocol without IV contrast.  COMPARISON:  CT chest 10/14/2012. Two-view chest x-ray earlier same date.  FINDINGS: Lack of intravenous contrast makes the examination less sensitive than it would be otherwise. Severe emphysematous changes throughout both lungs with dense masslike opacity in the right upper lobe. Gas bubbles within the opacity. Extensive bullous changes are present in the right upper lobe in the area of the opacity. Patchy airspace opacities are present more inferiorly in the right lower lobe. Minimal patchy airspace opacities anteriorly in the right middle lobe. Masslike pleural-based opacity in the inferior left lower lobe measuring approximately 1.8 x 2.1 x 2.0 cm.  Multiple bilateral lung nodules, many of  which are new since the prior CT. The largest nodule is in the inferior left upper lobe and measures approximately 5 mm (series 3, image 30).  No associated pleural effusions.  No pleural masses.  Normal heart size with extensive 3 vessel coronary atherosclerosis. Severe atherosclerosis involving the thoracic and upper abdominal aorta and their visualized branches without evidence of aneurysm. No visible mediastinal, hilar or axillary lymphadenopathy. Visualized thyroid gland unremarkable.  Visualized upper abdomen unremarkable. 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  IMPRESSION: 1. 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 2. Approximate 2 cm mass in the inferior left lower lobe. 3. Numerous bilateral pulmonary nodules, the largest approximating 5 mm in the left lower lobe. 4. Severe COPD/emphysema. 5. No visible mediastinal, hilar or axillary lymphadenopathy, allowing for the unenhanced technique.   Electronically Signed   By: Evangeline Dakin  M.D.   On: 01/22/2015 08:29   Ct Chest W Contrast  01/28/2015   CLINICAL DATA:  Followup right upper lobe lung process.  EXAM: CT CHEST WITH CONTRAST  TECHNIQUE: Multidetector CT imaging of the chest was performed during intravenous contrast administration.  CONTRAST:  48mL OMNIPAQUE IOHEXOL 300 MG/ML  SOLN  COMPARISON:  CT scans 01/22/2015  FINDINGS: Chest wall: No breast masses, supraclavicular or axillary lymphadenopathy. The thyroid gland appears normal. The bony thorax is intact. No destructive bone lesions or spinal canal compromise.  Mediastinum: The heart is normal in size. No pericardial effusion. Stable advanced atherosclerotic calcifications involving the aorta and branch vessels including the coronary arteries. The esophagus is grossly normal.  Stable small mediastinal lymph nodes. The right hilar node has  enlarged slightly. It measures 20 x 21 mm on image number 25.  Lungs/pleura: There is a persistent and progressive necrotic appearing right upper lobe pneumonia. It previously measured a maximum of 7.3 x 2.8 cm and now measures 10.7 x 5.8 cm. Small abscesses are suspected. I do not see the discrete rim enhancing small abscess opposed demonstrated on the prior scan. There is also severe surrounding inflammation in the right lung with marked interstitial thickening and ground-glass opacity. I do not see a discrete mass or endobronchial lesion to suggest neoplasm.  There are numerous small bilateral pulmonary nodules which are unchanged. The nodular density at the left lung base adjacent to the major fissure in the left lower lobe measures approximately 11 mm. Recommend continued observation.  Upper abdomen:  No significant findings.  IMPRESSION: 1. Worsening necrotic appearing right upper lobe pneumonia with possible small abscesses. Surrounding pneumonitis is also progressive. 2. Slight interval enlargement of right hilar lymph nodes. 3. Stable bilateral pulmonary nodules. 4. Bronchoscopy may be helpful for further evaluation. Repeat chest CT after appropriate antibiotic therapy is recommended.   Electronically Signed   By: Marijo Sanes M.D.   On: 01/28/2015 10:29   Ct Chest W Contrast  01/22/2015   CLINICAL DATA:  Right upper lobe lung mass with productive cough and chest pain.  EXAM: CT CHEST, ABDOMEN, AND PELVIS WITH CONTRAST  TECHNIQUE: Multidetector CT imaging of the chest, abdomen and pelvis was performed following the standard protocol during bolus administration of intravenous contrast.  CONTRAST:  170mL OMNIPAQUE IOHEXOL 300 MG/ML  SOLN  COMPARISON:  Unenhanced CT of the chest earlier today at 737 hours and prior CT on 10/14/2012.  FINDINGS: CT CHEST FINDINGS  Geographic area of peripheral consolidation in the lateral and posterior right upper lobe spans over a region measuring roughly 3.7 x 7.3 x 4.4  cm. Areas of gas formation are consistent with necrosis and there is a small posterior component of focal fluid attenuation measuring roughly 1.5 cm in diameter. Based on the contrast-enhanced CT, this may represent a necrotic pneumonia and is not necessarily representative of malignancy. The small area of liquefaction may represent early focal abscess formation. Additional ground-glass and alveolar opacity in the anterior right upper lobe and right middle lobe likely represent acute infection and extension of infiltrate. Clinical and imaging follow-up is recommended. If the process does not resolve, or worsens on antibiotic therapy, Pulmonary Medicine evaluation is recommended.  The area of mass-like opacity measured in the anterior aspect of the lateral left lower lobe is again visualized. This is also somewhat geographic in appearance and may not represent a focal mass. Focal atelectasis and scarring is possible. Malignancy is not completely excluded. A 6-7 mm anterior right upper  lobe nodule is stable since 2014. A 5 mm anterior left upper lobe nodule is stable since 2014. A 5 mm inferior lingular nodule is stable since 2014. 4 mm subpleural nodule in the right middle lobe is stable since 2014. There are 2 tiny subpleural nodules in the posterior right lower lobe. One of these located more medially was not as apparent on the 2014 study. Tiny lateral subpleural nodules in the right lower lobe appears stable since the prior study in 2014.  No airway obstruction. Mildly prominent right hilar lymph node measures approximately 11 mm in short axis. There are a few tiny additional mediastinal nodes with no enlarged lymph nodes seen. No pleural or pericardial fluid is seen. The heart size is normal. Mild spondylosis present of the thoracic spine.  CT ABDOMEN AND PELVIS FINDINGS  Solid organs show no evidence of metastatic disease. The liver, pancreas, spleen, adrenal glands and kidneys are normal. There is a small  hiatal hernia. The gallbladder has been removed. No enlarged lymph nodes are seen.  Bowel is unremarkable and shows no evidence of obstruction or lesion. No free fluid, free air or abscess is identified. Small right inguinal hernia contains fat. The bladder is unremarkable. The uterus has been removed. The abdominal aorta and iliac arteries show atherosclerotic calcifications without aneurysmal disease or significant obstructive disease identified. No bony lesions are identified.  IMPRESSION: 1. The geographic area of peripheral consolidation in the right upper lobe has an appearance most likely consistent with necrotic pneumonia with additional small early abscess formation measuring 1.5 cm within this region. Although malignancy cannot be excluded, necrotic pneumonia is favored. Additional alveolar and ground-glass infiltrate extends into the anterior right upper lobe and right middle lobe. If the process does not resolve or worsens, pulmonary Medicine evaluation is recommended. Associated 11 mm right hilar lymph node may be reactive or metastatic. 2. The peripheral lateral left lower lobe opacity may represent focal atelectasis and scarring. Malignancy cannot be excluded, however. Once the pneumonia of the right upper lobe has been adequately treated, if there is persistent opacity in the left lower lobe, PET scan evaluation may be helpful. 3. All of the subcentimeter small nodules identified bilaterally are largely peripheral/subpleural and are stable since the CT in 2014. One of the right lower lobe nodules may be new/more prominent but only measures a few mm in diameter. All of these tiny nodules are most likely postinflammatory and not representative of metastatic nodules. 4. No evidence of pathology in the abdomen or pelvis. No acute findings or evidence of metastatic disease. Incidental small hiatal hernia and small right inguinal hernia containing fat.   Electronically Signed   By: Aletta Edouard M.D.    On: 01/22/2015 13:45   Ct Abdomen Pelvis W Contrast  01/22/2015   CLINICAL DATA:  Right upper lobe lung mass with productive cough and chest pain.  EXAM: CT CHEST, ABDOMEN, AND PELVIS WITH CONTRAST  TECHNIQUE: Multidetector CT imaging of the chest, abdomen and pelvis was performed following the standard protocol during bolus administration of intravenous contrast.  CONTRAST:  149mL OMNIPAQUE IOHEXOL 300 MG/ML  SOLN  COMPARISON:  Unenhanced CT of the chest earlier today at 737 hours and prior CT on 10/14/2012.  FINDINGS: CT CHEST FINDINGS  Geographic area of peripheral consolidation in the lateral and posterior right upper lobe spans over a region measuring roughly 3.7 x 7.3 x 4.4 cm. Areas of gas formation are consistent with necrosis and there is a small posterior component of focal  fluid attenuation measuring roughly 1.5 cm in diameter. Based on the contrast-enhanced CT, this may represent a necrotic pneumonia and is not necessarily representative of malignancy. The small area of liquefaction may represent early focal abscess formation. Additional ground-glass and alveolar opacity in the anterior right upper lobe and right middle lobe likely represent acute infection and extension of infiltrate. Clinical and imaging follow-up is recommended. If the process does not resolve, or worsens on antibiotic therapy, Pulmonary Medicine evaluation is recommended.  The area of mass-like opacity measured in the anterior aspect of the lateral left lower lobe is again visualized. This is also somewhat geographic in appearance and may not represent a focal mass. Focal atelectasis and scarring is possible. Malignancy is not completely excluded. A 6-7 mm anterior right upper lobe nodule is stable since 2014. A 5 mm anterior left upper lobe nodule is stable since 2014. A 5 mm inferior lingular nodule is stable since 2014. 4 mm subpleural nodule in the right middle lobe is stable since 2014. There are 2 tiny subpleural nodules in  the posterior right lower lobe. One of these located more medially was not as apparent on the 2014 study. Tiny lateral subpleural nodules in the right lower lobe appears stable since the prior study in 2014.  No airway obstruction. Mildly prominent right hilar lymph node measures approximately 11 mm in short axis. There are a few tiny additional mediastinal nodes with no enlarged lymph nodes seen. No pleural or pericardial fluid is seen. The heart size is normal. Mild spondylosis present of the thoracic spine.  CT ABDOMEN AND PELVIS FINDINGS  Solid organs show no evidence of metastatic disease. The liver, pancreas, spleen, adrenal glands and kidneys are normal. There is a small hiatal hernia. The gallbladder has been removed. No enlarged lymph nodes are seen.  Bowel is unremarkable and shows no evidence of obstruction or lesion. No free fluid, free air or abscess is identified. Small right inguinal hernia contains fat. The bladder is unremarkable. The uterus has been removed. The abdominal aorta and iliac arteries show atherosclerotic calcifications without aneurysmal disease or significant obstructive disease identified. No bony lesions are identified.  IMPRESSION: 1. The geographic area of peripheral consolidation in the right upper lobe has an appearance most likely consistent with necrotic pneumonia with additional small early abscess formation measuring 1.5 cm within this region. Although malignancy cannot be excluded, necrotic pneumonia is favored. Additional alveolar and ground-glass infiltrate extends into the anterior right upper lobe and right middle lobe. If the process does not resolve or worsens, pulmonary Medicine evaluation is recommended. Associated 11 mm right hilar lymph node may be reactive or metastatic. 2. The peripheral lateral left lower lobe opacity may represent focal atelectasis and scarring. Malignancy cannot be excluded, however. Once the pneumonia of the right upper lobe has been  adequately treated, if there is persistent opacity in the left lower lobe, PET scan evaluation may be helpful. 3. All of the subcentimeter small nodules identified bilaterally are largely peripheral/subpleural and are stable since the CT in 2014. One of the right lower lobe nodules may be new/more prominent but only measures a few mm in diameter. All of these tiny nodules are most likely postinflammatory and not representative of metastatic nodules. 4. No evidence of pathology in the abdomen or pelvis. No acute findings or evidence of metastatic disease. Incidental small hiatal hernia and small right inguinal hernia containing fat.   Electronically Signed   By: Aletta Edouard M.D.   On: 01/22/2015 13:45  CBC  Recent Labs Lab 01/28/15 1016  WBC 8.2  HGB 9.2*  HCT 28.3*  PLT 349  MCV 94.7  MCH 30.8  MCHC 32.6  RDW 15.8*  LYMPHSABS 0.6*  MONOABS 0.3  EOSABS 0.0  BASOSABS 0.0    Chemistries   Recent Labs Lab 01/27/15 0915 01/30/15 0158 01/31/15 0418 02/01/15 0452  CREATININE 0.79 1.15* 1.07* 1.00   ------------------------------------------------------------------------------------------------------------------ estimated creatinine clearance is 52.3 mL/min (by C-G formula based on Cr of 1). ------------------------------------------------------------------------------------------------------------------ No results for input(s): HGBA1C in the last 72 hours. ------------------------------------------------------------------------------------------------------------------ No results for input(s): CHOL, HDL, LDLCALC, TRIG, CHOLHDL, LDLDIRECT in the last 72 hours. ------------------------------------------------------------------------------------------------------------------ No results for input(s): TSH, T4TOTAL, T3FREE, THYROIDAB in the last 72 hours.  Invalid input(s):  FREET3 ------------------------------------------------------------------------------------------------------------------ No results for input(s): VITAMINB12, FOLATE, FERRITIN, TIBC, IRON, RETICCTPCT in the last 72 hours.  Coagulation profile No results for input(s): INR, PROTIME in the last 168 hours.  No results for input(s): DDIMER in the last 72 hours.  Cardiac Enzymes No results for input(s): CKMB, TROPONINI, MYOGLOBIN in the last 168 hours.  Invalid input(s): CK ------------------------------------------------------------------------------------------------------------------ Invalid input(s): POCBNP    Assessment & Plan   Active Problems:   Necrotizing pneumonia IMPRESSION AND PLAN: Patient is a 58 year old African-American female with history of nicotine addiction, COPD presents with right-sided chest pain noted to have a mass as well as possible pneumonia.Intial ct scan with necrotizing pna, was treated with iv abx no improvement repeat ct showes worsening pna  #1.  Necorotizing pna-  , continue vanc and zosyn and clindamycin. Her bronchoscopy today.  #2. COPD; there is no evidence of acute exacerbation continue her on Advair and Spiriva. And Combivent inhalers as needed.  #3. Peripheral vascular disease;   hold lovenox for biopsy,continue statins  #4. Nicotine addiction smoking cessation done 5.htn;controlled    Code Status Orders        Start     Ordered   01/22/15 0947  Full code   Continuous     01/22/15 0948      Family Communication: Patient   Disposition Plan: Home   Procedures none   Consults pulmonary consult , ID consult   DVT Prophylaxis  SCDs  Lab Results  Component Value Date   PLT 349 01/28/2015     Time Spent in minutes 96 minutes   Geraldyn Shain M.D on 02/02/2015 at 12:07 PM  Between 7am to 6pm - Pager - [phone]  After 6pm go to [email or web address] - password [email or web address] [facility]   Office   [phone]

## 2015-02-03 ENCOUNTER — Encounter: Payer: Self-pay | Admitting: Urgent Care

## 2015-02-03 DIAGNOSIS — D649 Anemia, unspecified: Secondary | ICD-10-CM

## 2015-02-03 DIAGNOSIS — K625 Hemorrhage of anus and rectum: Secondary | ICD-10-CM

## 2015-02-03 LAB — CBC
HCT: 27.5 % — ABNORMAL LOW (ref 35.0–47.0)
HCT: 26 % — ABNORMAL LOW (ref 35.0–47.0)
Hemoglobin: 8.8 g/dL — ABNORMAL LOW (ref 12.0–16.0)
Hemoglobin: 8.3 g/dL — ABNORMAL LOW (ref 12.0–16.0)
MCH: 29.6 pg (ref 26.0–34.0)
MCH: 29.6 pg (ref 26.0–34.0)
MCHC: 32.1 g/dL (ref 32.0–36.0)
MCHC: 31.9 g/dL — AB (ref 32.0–36.0)
MCV: 92.5 fL (ref 80.0–100.0)
MCV: 92.9 fL (ref 80.0–100.0)
Platelets: 612 10*3/uL — ABNORMAL HIGH (ref 150–440)
Platelets: 578 10*3/uL — ABNORMAL HIGH (ref 150–440)
RBC: 2.97 MIL/uL — ABNORMAL LOW (ref 3.80–5.20)
RBC: 2.8 MIL/uL — AB (ref 3.80–5.20)
RDW: 16.8 % — ABNORMAL HIGH (ref 11.5–14.5)
RDW: 16.8 % — ABNORMAL HIGH (ref 11.5–14.5)
WBC: 11 10*3/uL (ref 3.6–11.0)
WBC: 10.8 10*3/uL (ref 3.6–11.0)

## 2015-02-03 LAB — SURGICAL PATHOLOGY

## 2015-02-03 LAB — C DIFFICILE QUICK SCREEN W PCR REFLEX
C DIFFICILE (CDIFF) TOXIN: NEGATIVE
C Diff antigen: NEGATIVE
C Diff interpretation: NEGATIVE

## 2015-02-03 LAB — CYTOLOGY - NON PAP

## 2015-02-03 LAB — CULTURE, RESPIRATORY W GRAM STAIN

## 2015-02-03 LAB — CULTURE, RESPIRATORY: CULTURE: NORMAL

## 2015-02-03 MED ORDER — CIPROFLOXACIN HCL 500 MG PO TABS: 500.0000 mg | ORAL_TABLET | Freq: Two times a day (BID) | ORAL | Status: DC

## 2015-02-03 MED ORDER — ENSURE ENLIVE PO LIQD: 237.0000 mL | Freq: Three times a day (TID) | ORAL | Status: DC

## 2015-02-03 MED ORDER — SODIUM CHLORIDE 0.9 % IV SOLN
INTRAVENOUS | Status: DC
  Administered 2015-02-03 – 2015-02-04 (×3): via INTRAVENOUS

## 2015-02-03 MED ORDER — CLINDAMYCIN HCL 300 MG PO CAPS: 300.0000 mg | ORAL_CAPSULE | Freq: Three times a day (TID) | ORAL | Status: DC

## 2015-02-03 NOTE — Plan of Care (Addendum)
Problem: Discharge Progression Outcomes Goal: Other Discharge Outcomes/Goals Outcome: Not Progressing Patient's discharge cancelled today due to 2 loose bowel movements one in the middle of the night one this am and pm, bowel movement was loose with bright red blood present, Patient was hypotensive, started back on fluids and GI consulted and called.  Patient not happy about this decision.  Blood pressure improved with fluids, remains alert and oriented,  cdiff sample collected around 1800 Erika Huger NP seen sample and ordered repeat CBC in four hours to be drawn to check Hg.

## 2015-02-03 NOTE — Consult Note (Signed)
Gastroenterology Consultation  Referring Provider:     No ref. provider found Primary Care Physician:  No primary care provider on file. Primary Gastroenterologist:  Dr. Candace Cruise      Reason for Consultation:     Bloody diarrhea  Date of Admission:  01/22/2015 Date of Consultation:  02/03/2015        HPI:   [name] is a 59 y.o. female admitted with necrotizing pneumonia and has been followed since 01/22/15 by hospitalist and infectious disease for antibiotic management.  She is a smoker.  She was supposed to be discharged today but developed explosive diarrhea mixed with bright red blood at 3am & again this afternoon. Patient was also hypotensive. Stool ordered for c diff but has not been collected a she did not have any more diarrhea.  Patient has hx of hemorrhoids & hemorrhoidectomy by Dr Pat Patrick.  She feels like this was her hemorrhoids.  Denies constipation.  Denies heartburn, indigestion, nausea, vomiting, dysphagia, odynophagia or anorexia.  Colonoscopy by Dr Candace Cruise in 2013 as below.  01/22/15 she had CT scan with contrast of the abdomen and pelvis that showed an incidental small hiatal hernia and small right inguinal hernia containing fat, but no acute abdominal pelvic processes.   Past Medical History  Diagnosis Date  . 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negative cardiac cath  . COPD (chronic obstructive pulmonary disease)   . Hypertension   . Peripheral vascular disease   . Nicotine addiction   . Asthma   . Hemorrhoids     Past Surgical History  Procedure Laterality Date  . Endovascular stent insertion Bilateral     Legs  . Cardiac surgery    . Hemorrhoid surgery    . Colonoscopy  12/15/11    OH->bleeding internal hemorrhoids, otherwise normal    Prior to Admission medications   Medication Sig Start Date End Date Taking? Authorizing Provider  aspirin EC 325 MG tablet Take 325 mg by mouth daily.   Yes Historical Provider, MD  atorvastatin (LIPITOR) 80 MG tablet Take 80 mg by mouth every  morning.  11/15/14  Yes Historical Provider, MD  cetirizine (ZYRTEC) 10 MG tablet Take 10 mg by mouth daily.   Yes Historical Provider, MD  ferrous sulfate 325 (65 FE) MG tablet Take 325 mg by mouth daily.   Yes Historical Provider, MD  losartan (COZAAR) 50 MG tablet Take 50 mg by mouth daily. 12/09/14  Yes Historical Provider, MD  metoprolol (LOPRESSOR) 50 MG tablet Take 100 mg by mouth daily. 01/19/15  Yes Historical Provider, MD  ciprofloxacin (CIPRO) 500 MG tablet Take 1 tablet (500 mg total) by mouth 2 (two) times daily. 02/03/15   Epifanio Lesches, MD  clindamycin (CLEOCIN) 300 MG capsule Take 1 capsule (300 mg total) by mouth every 8 (eight) hours. 02/03/15   Epifanio Lesches, MD  feeding supplement, ENSURE ENLIVE, (ENSURE ENLIVE) LIQD Take 237 mLs by mouth 3 (three) times daily with meals. 02/03/15   Epifanio Lesches, MD    Family History  Problem Relation Age of Onset  . Hypertension Mother   . Hypertension Father      History  Substance Use Topics  . Smoking status: Current Every Day Smoker -- 0.50 packs/day    Types: Cigarettes  . Smokeless tobacco: Never Used     Comment: pt recommend to stop smoking 4 min spent will start on prn nicotine replacment  . Alcohol Use: 0.0 oz/week    0 Standard drinks or equivalent per week  Comment: weekends    Allergies as of 01/22/2015 - Review Complete 01/22/2015  Allergen Reaction Noted  . Aspirin Nausea And Vomiting and Other (See Comments) 01/22/2015    Review of Systems:    All systems reviewed and negative except where noted in HPI.   Physical Exam:  Vital signs in last 24 hours: Temp:  [98.6 F (37 C)-99.2 F (37.3 C)] 99.2 F (37.3 C) (06/02 1442) Pulse Rate:  [69-96] 92 (06/02 1442) Resp:  [18-20] 20 (06/02 1442) BP: (87-115)/(55-81) 103/55 mmHg (06/02 1442) SpO2:  [94 %-99 %] 95 % (06/02 1442) Last BM Date: 02/03/15 Body mass index is 21.66 kg/(m^2). General:   Alert,  Well-developed, well-nourished, pleasant and  cooperative in NAD Head:  Normocephalic and atraumatic. Eyes:  Sclera clear, no icterus.   Conjunctiva pink. Ears:  Normal auditory acuity. Nose:  No deformity, discharge, or lesions. Mouth:  No deformity or lesions,oropharynx pink & moist. Neck:  Supple; no masses or thyromegaly. Lungs:  Respirations even and unlabored.  +rhonchi bilaterally.  No acute distress. Heart:  Regular rate and rhythm; no murmurs, clicks, rubs, or gallops. Abdomen:  Normal bowel sounds.  No bruits.  Soft, non-tender and non-distended without masses, hepatosplenomegaly or hernias noted.  No guarding or rebound tenderness.     Rectal:  Deferred. Msk:  Symmetrical without gross deformities.  Good, equal movement & strength bilaterally. Pulses:  Normal pulses noted. Extremities:  + clubbing.  No edema.  No cyanosis. Neurologic:  Alert and oriented x3;  grossly normal neurologically. Skin:  Intact without significant lesions or rashes.  No jaundice. Lymph Nodes:  No significant cervical adenopathy. Psych:  Alert and cooperative. Normal mood and affect.  LAB RESULTS:  Recent Labs  02/03/15 1147  WBC 11.0  HGB 8.8*  HCT 27.5*  PLT 612*   BMET  Recent Labs  02/01/15 0452  CREATININE 1.00     Impression / Plan:   [name] is a 59 y.o. y/o female with acute onset of 2 large volume watery stools with bright red blood.  Patient has been hypotensive and is a smoker and may have had an episode of ischemic colitis. Differentials include hemorrhoidal bleeding or acute infectious colitis such as C. Difficile, or Pseudomembranous colitis secondary to antibiotic use.  C diff pending collection. Rest and supportive measures.   Thank you for involving me in the care of this patient.     LOS: 12 days  Vickey Huger, NP  02/03/2015, 5:51 PM [facility]  [place], [place] [address] Phone: [phone] Fax : [phone]

## 2015-02-03 NOTE — Progress Notes (Signed)
[place] at [facility]                                                                                                                                                                                            Patient Demographics   [name], is a 59 y.o. female, DOB - [birth year], MWU:[id number]  Admit date - 01/22/2015   Admitting Physician Dustin Flock, MD  Outpatient Primary MD for the patient is No primary care provider on file.  LOS - 12  Chief Complaint  Patient presents with  . Pleurisy    Subjective:  Supposed to be discharged today but the patient developed explosive diarrhea mixed with blood. Patient also is hypotensive. So we will cancel the discharge, check CBC stat, GI consult, stool for C. Difficile.  Review of Systems:   CONSTITUTIONAL: no fever, weakness. No weight gain, no weight loss.  EYES: No blurry or double vision.  ENT: No tinnitus. No postnasal drip. No redness of the oropharynx.  RESPIRATORY: Dry cough, no wheeze, no hemoptysis. Mild dyspnea.  CARDIOVASCULAR: Right-sided chest pain. No orthopnea. No palpitations. No syncope.  GASTROINTESTINAL: black stool.today.no abdominal pain,. GENITOURINARY: No dysuria or hematuria.  ENDOCRINE: No polyuria or nocturia. No heat or cold intolerance.  HEMATOLOGY: No anemia. No bruising. No bleeding.  INTEGUMENTARY: No rashes. No lesions.  MUSCULOSKELETAL: No arthritis. No swelling. No gout.  NEUROLOGIC: No numbness, tingling, or ataxia. No seizure-type activity.  PSYCHIATRIC: No anxiety. No insomnia. No ADD.    Vitals:   Filed Vitals:   02/03/15 0755 02/03/15 0812 02/03/15 1049 02/03/15 1057  BP:   87/60 94/60  Pulse:   92 96  Temp:      TempSrc:      Resp:      Height:      Weight:      SpO2: 98% 95% 94%     Wt Readings from Last 3 Encounters:  01/22/15 57.267 kg (126 lb 4 oz)     Intake/Output Summary (Last 24 hours) at 02/03/15 1115 Last data filed at  02/02/15 1800  Gross per 24 hour  Intake      0 ml  Output      0 ml  Net      0 ml    Physical Exam:   GENERAL: Pleasant-appearing in no apparent distress.  HEAD, EYES, EARS, NOSE AND THROAT: Atraumatic, normocephalic. Extraocular muscles are intact. Pupils equal and reactive to light. Sclerae anicteric. No conjunctival injection. No oro-pharyngeal erythema.  NECK: Supple. There is no jugular venous distention. No bruits, no lymphadenopathy, no thyromegaly.  HEART: Regular rate  and rhythm, tachycardic. No murmurs, no rubs, no clicks.  LUNGS: Occasional rhonchi. No rales or  No wheezes.  ABDOMEN: Soft, flat, nontender, nondistended. Has good bowel sounds. No hepatosplenomegaly appreciated.  EXTREMITIES: No evidence of any cyanosis, clubbing, or peripheral edema.  +2 pedal and radial pulses bilaterally.  NEUROLOGIC: The patient is alert, awake, and oriented x3 with no focal motor or sensory deficits appreciated bilaterally.  SKIN: Moist and warm with no rashes appreciated.  Psych: Not anxious, depressed LN: No inguinal LN enlargement    Antibiotics   Anti-infectives    Start     Dose/Rate Route Frequency Ordered Stop   02/03/15 0000  ciprofloxacin (CIPRO) 500 MG tablet     500 mg Oral 2 times daily 02/03/15 0815     02/03/15 0000  clindamycin (CLEOCIN) 300 MG capsule     300 mg Oral Every 8 hours 02/03/15 0815     02/02/15 2000  ciprofloxacin (CIPRO) tablet 500 mg     500 mg Oral 2 times daily 02/02/15 1500     02/02/15 1515  clindamycin (CLEOCIN) capsule 300 mg     300 mg Oral 3 times per day 02/02/15 1500     02/01/15 2300  vancomycin (VANCOCIN) IVPB 1000 mg/200 mL premix  Status:  Discontinued     1,000 mg 200 mL/hr over 60 Minutes Intravenous Every 12 hours 02/01/15 2224 02/02/15 1456   01/30/15 1600  vancomycin (VANCOCIN) 1,250 mg in sodium chloride 0.9 % 250 mL IVPB  Status:  Discontinued     1,250 mg 166.7 mL/hr over 90 Minutes Intravenous Every 18 hours 01/30/15 0346  02/01/15 2224   01/30/15 0330  vancomycin (VANCOCIN) 1,250 mg in sodium chloride 0.9 % 250 mL IVPB  Status:  Discontinued     1,250 mg 166.7 mL/hr over 90 Minutes Intravenous Every 18 hours 01/30/15 0321 01/30/15 0346   01/28/15 2200  piperacillin-tazobactam (ZOSYN) IVPB 3.375 g  Status:  Discontinued     3.375 g 12.5 mL/hr over 240 Minutes Intravenous 3 times per day 01/28/15 1401 02/02/15 1456   01/28/15 1600  clindamycin (CLEOCIN) IVPB 600 mg  Status:  Discontinued     600 mg 100 mL/hr over 30 Minutes Intravenous Every 6 hours 01/28/15 1325 02/02/15 1456   01/28/15 1430  vancomycin (VANCOCIN) 1,250 mg in sodium chloride 0.9 % 250 mL IVPB  Status:  Discontinued     1,250 mg 166.7 mL/hr over 90 Minutes Intravenous Every 12 hours 01/28/15 1352 01/28/15 1354   01/28/15 1430  vancomycin (VANCOCIN) 1,500 mg in sodium chloride 0.9 % 500 mL IVPB  Status:  Discontinued     1,500 mg 250 mL/hr over 120 Minutes Intravenous Every 12 hours 01/28/15 1354 01/30/15 0321   01/28/15 1330  piperacillin-tazobactam (ZOSYN) IVPB 3.375 g  Status:  Discontinued     3.375 g 12.5 mL/hr over 240 Minutes Intravenous 4 times per day 01/28/15 1325 01/28/15 1401   01/27/15 2200  amoxicillin-clavulanate (AUGMENTIN) 875-125 MG per tablet 1 tablet  Status:  Discontinued     1 tablet Oral Every 12 hours 01/27/15 1702 01/28/15 1324   01/26/15 0630  vancomycin (VANCOCIN) 1,250 mg in sodium chloride 0.9 % 250 mL IVPB  Status:  Discontinued     1,250 mg 166.7 mL/hr over 90 Minutes Intravenous Every 12 hours 01/26/15 0603 01/28/15 0906   01/22/15 1600  piperacillin-tazobactam (ZOSYN) IVPB 3.375 g  Status:  Discontinued     3.375 g 12.5 mL/hr over 240 Minutes Intravenous 3  times per day 01/22/15 1352 01/27/15 1702   01/22/15 1430  azithromycin (ZITHROMAX) 500 mg in dextrose 5 % 250 mL IVPB     500 mg 250 mL/hr over 60 Minutes Intravenous  Once 01/22/15 1351 01/22/15 1606   01/22/15 1400  piperacillin-tazobactam (ZOSYN) IVPB  3.375 g  Status:  Discontinued     3.375 g 12.5 mL/hr over 240 Minutes Intravenous 3 times per day 01/22/15 0951 01/22/15 1352   01/22/15 1200  vancomycin (VANCOCIN) IVPB 1000 mg/200 mL premix  Status:  Discontinued     1,000 mg 200 mL/hr over 60 Minutes Intravenous Every 18 hours 01/22/15 1124 01/26/15 0603   01/22/15 1000  vancomycin (VANCOCIN) IVPB 1000 mg/200 mL premix  Status:  Discontinued     1,000 mg 200 mL/hr over 60 Minutes Intravenous Every 24 hours 01/22/15 0951 01/22/15 1124   01/22/15 0845  cefTRIAXone (ROCEPHIN) 1 g in dextrose 5 % 50 mL IVPB     1 g 100 mL/hr over 30 Minutes Intravenous  Once 01/22/15 0843 01/22/15 0931   01/22/15 0845  azithromycin (ZITHROMAX) 500 mg in dextrose 5 % 250 mL IVPB  Status:  Discontinued     500 mg 250 mL/hr over 60 Minutes Intravenous  Once 01/22/15 0843 01/22/15 1351      Medications   Scheduled Meds: . atorvastatin  80 mg Oral BH-[id number]  . butamben-tetracaine-benzocaine  1 spray Topical Once  . ciprofloxacin  500 mg Oral BID  . clindamycin  300 mg Oral 3 times per day  . enoxaparin (LOVENOX) injection  40 mg Subcutaneous Q24H  . feeding supplement (ENSURE ENLIVE)  237 mL Oral TID WC  . ferrous sulfate  325 mg Oral Daily  . guaiFENesin  600 mg Oral BID  . lidocaine  1 application Topical Once  . losartan  50 mg Oral Daily  . metoprolol  100 mg Oral Daily  . polyethylene glycol  17 g Oral Daily  . zolpidem  5 mg Oral QHS   Continuous Infusions:   PRN Meds:.acetaminophen **OR** acetaminophen, calcium carbonate, guaiFENesin-codeine, HYDROcodone-acetaminophen, lactulose, menthol-cetylpyridinium, morphine injection, nicotine polacrilex, ondansetron **OR** ondansetron (ZOFRAN) IV, phenylephrine, senna-docusate, zolpidem   Data Review:   Micro Results Recent Results (from the past 240 hour(s))  Culture, sputum-assessment     Status: None   Collection Time: 01/25/15  2:47 AM  Result Value Ref Range Status   Specimen Description SPU   Final   Special Requests NONE  Final   Sputum evaluation   Final    THIS SPECIMEN IS ACCEPTABLE FOR SPUTUM CULTURE CTJ 01/25/15    Report Status 01/25/2015 FINAL  Final  Culture, respiratory (NON-Expectorated)     Status: None (Preliminary result)   Collection Time: 01/25/15  2:47 AM  Result Value Ref Range Status   Specimen Description SPU  Final   Special Requests NONE Reflexed from [id number]  Final   Gram Stain PENDING  Incomplete   Culture APPEARS TO NORMAL FLORA PRESENT  Final   Report Status PENDING  Incomplete  Culture, respiratory (NON-Expectorated)     Status: None (Preliminary result)   Collection Time: 02/02/15  1:30 PM  Result Value Ref Range Status   Specimen Description BRONCHIAL BRUSHING  Final   Special Requests Normal  Final   Gram Stain PENDING  Incomplete   Culture NO GROWTH < 24 HOURS  Final   Report Status PENDING  Incomplete    Radiology Reports Dg Chest 2 View  02/02/2015   CLINICAL DATA:  Post  bronchoscopy.  Biopsy.  EXAM: CHEST  2 VIEW  COMPARISON:  02/01/2015  FINDINGS: Large area of consolidation again noted in the right upper lobe. This is unchanged. No pneumothorax following bronchoscopy. Scarring or atelectasis at the left base. Heart is normal size. No effusions or acute bony abnormality.  IMPRESSION: No pneumothorax following biopsy. Stable right upper lobe masslike area consolidation.   Electronically Signed   By: Rolm Baptise M.D.   On: 02/02/2015 14:48   Dg Chest 2 View  02/01/2015   CLINICAL DATA:  Follow-up pneumonia. Cough, congestion and chest pain.  EXAM: CHEST  2 VIEW  COMPARISON:  01/28/2015 CT and prior exams.  FINDINGS: Mild cardiomegaly again noted.  Right upper lobe consolidation/airspace disease has improved.  Emphysema/COPD again noted  There is no evidence of pneumothorax or pleural effusion.  No acute bony abnormalities are noted.  IMPRESSION: Improved right upper lobe consolidation/airspace disease compatible with pneumonia. Continued  radiographic follow-up to resolution recommended.   Electronically Signed   By: Margarette Canada M.D.   On: 02/01/2015 20:46   Dg Chest 2 View  01/27/2015   CLINICAL DATA:  Shortness of breath and cough, followup  EXAM: CHEST  2 VIEW  COMPARISON:  CT chest of 01/22/2015 and chest x-ray of 01/22/2015  FINDINGS: There does appear to have been some interval worsening of the consolidation of a portion of the right upper lobe with areas of cavitation suggesting necrotic pneumonia. Vague opacity at the left costophrenic angle may represent scarring or patchy pneumonia as well. No definite effusion is seen. Heart size is normal. No bony abnormality is seen.  IMPRESSION: Perhaps slight worsening of right upper lobe opacity most consistent with necrotic pneumonia with areas of apparent cavitation.   Electronically Signed   By: Ivar Drape M.D.   On: 01/27/2015 08:05   Dg Chest 2 View  01/22/2015   CLINICAL DATA:  Pleurisy. Right-sided pain, back pain for 1 week. History of COPD.  EXAM: CHEST  2 VIEW  COMPARISON:  10/15/2013  FINDINGS: Within the posterior right upper lobe is a a 4.7 cm rounded masslike opacity, with surrounding ill-defined density. Additional ill-defined density in the right middle lobe. Underlying emphysema again seen. Probable atelectasis or scarring at the left costophrenic angle. The heart size and mediastinal contours are normal, mild atherosclerosis of the thoracic aorta. No pleural effusion. No pneumothorax. There is degenerative change in the spine.  IMPRESSION: Rounded 4.7 since masslike opacity in the right upper lobe, with surrounding ill-defined density. This may reflect pneumonia versus neoplasm. There is additional opacity in the right middle lobe concerning for pneumonia.  Chest CT correlation versus radiographic follow-up recommended. While traditional follow-up for pneumonia would be in 3-4 weeks after course of antibiotics, given the masslike appearance, shorter interval follow-up is  recommended in 1-2 weeks.   Electronically Signed   By: Jeb Levering M.D.   On: 01/22/2015 06:42   Ct Chest Wo Contrast  01/22/2015   CLINICAL DATA:  Five day history of right-sided chest pain, worsening over the past 2 days. Right upper lobe lung mass on earlier chest x-ray.  EXAM: CT CHEST WITHOUT CONTRAST  TECHNIQUE: Multidetector CT imaging of the chest was performed following the standard protocol without IV contrast.  COMPARISON:  CT chest 10/14/2012. Two-view chest x-ray earlier same date.  FINDINGS: Lack of intravenous contrast makes the examination less sensitive than it would be otherwise. Severe emphysematous changes throughout both lungs with dense masslike opacity in the right upper lobe.  Gas bubbles within the opacity. Extensive bullous changes are present in the right upper lobe in the area of the opacity. Patchy airspace opacities are present more inferiorly in the right lower lobe. Minimal patchy airspace opacities anteriorly in the right middle lobe. Masslike pleural-based opacity in the inferior left lower lobe measuring approximately 1.8 x 2.1 x 2.0 cm.  Multiple bilateral lung nodules, many of which are new since the prior CT. The largest nodule is in the inferior left upper lobe and measures approximately 5 mm (series 3, image 30).  No associated pleural effusions.  No pleural masses.  Normal heart size with extensive 3 vessel coronary atherosclerosis. Severe atherosclerosis involving the thoracic and upper abdominal aorta and their visualized branches without evidence of aneurysm. No visible mediastinal, hilar or axillary lymphadenopathy. Visualized thyroid gland unremarkable.  Visualized upper abdomen unremarkable. 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  IMPRESSION: 1. 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 2. Approximate 2 cm  mass in the inferior left lower lobe. 3. Numerous bilateral pulmonary nodules, the largest approximating 5 mm in the left lower lobe. 4. Severe COPD/emphysema. 5. No visible mediastinal, hilar or axillary lymphadenopathy, allowing for the unenhanced technique.   Electronically Signed   By: Evangeline Dakin M.D.   On: 01/22/2015 08:29   Ct Chest W Contrast  01/28/2015   CLINICAL DATA:  Followup right upper lobe lung process.  EXAM: CT CHEST WITH CONTRAST  TECHNIQUE: Multidetector CT imaging of the chest was performed during intravenous contrast administration.  CONTRAST:  8mL OMNIPAQUE IOHEXOL 300 MG/ML  SOLN  COMPARISON:  CT scans 01/22/2015  FINDINGS: Chest wall: No breast masses, supraclavicular or axillary lymphadenopathy. The thyroid gland appears normal. The bony thorax is intact. No destructive bone lesions or spinal canal compromise.  Mediastinum: The heart is normal in size. No pericardial effusion. Stable advanced atherosclerotic calcifications involving the aorta and branch vessels including the coronary arteries. The esophagus is grossly normal.  Stable small mediastinal lymph nodes. The right hilar node has enlarged slightly. It measures 20 x 21 mm on image number 25.  Lungs/pleura: There is a persistent and progressive necrotic appearing right upper lobe pneumonia. It previously measured a maximum of 7.3 x 2.8 cm and now measures 10.7 x 5.8 cm. Small abscesses are suspected. I do not see the discrete rim enhancing small abscess opposed demonstrated on the prior scan. There is also severe surrounding inflammation in the right lung with marked interstitial thickening and ground-glass opacity. I do not see a discrete mass or endobronchial lesion to suggest neoplasm.  There are numerous small bilateral pulmonary nodules which are unchanged. The nodular density at the left lung base adjacent to the major fissure in the left lower lobe measures approximately 11 mm. Recommend continued observation.  Upper  abdomen:  No significant findings.  IMPRESSION: 1. Worsening necrotic appearing right upper lobe pneumonia with possible small abscesses. Surrounding pneumonitis is also progressive. 2. Slight interval enlargement of right hilar lymph nodes. 3. Stable bilateral pulmonary nodules. 4. Bronchoscopy may be helpful for further evaluation. Repeat chest CT after appropriate antibiotic therapy is recommended.   Electronically Signed   By: Marijo Sanes M.D.   On: 01/28/2015 10:29   Ct Chest W Contrast  01/22/2015   CLINICAL DATA:  Right upper lobe lung mass with productive cough and chest pain.  EXAM: CT CHEST, ABDOMEN, AND PELVIS WITH CONTRAST  TECHNIQUE: Multidetector CT imaging of the chest, abdomen and pelvis was performed following the standard protocol during bolus administration of intravenous contrast.  CONTRAST:  191mL OMNIPAQUE IOHEXOL 300 MG/ML  SOLN  COMPARISON:  Unenhanced CT of the chest earlier today at 737 hours and prior CT on 10/14/2012.  FINDINGS: CT CHEST FINDINGS  Geographic area of peripheral consolidation in the lateral and posterior right upper lobe spans over a region measuring roughly 3.7 x 7.3 x 4.4 cm. Areas of gas formation are consistent with necrosis and there is a small posterior component of focal fluid attenuation measuring roughly 1.5 cm in diameter. Based on the contrast-enhanced CT, this may represent a necrotic pneumonia and is not necessarily representative of malignancy. The small area of liquefaction may represent early focal abscess formation. Additional ground-glass and alveolar opacity in the anterior right upper lobe and right middle lobe likely represent acute infection and extension of infiltrate. Clinical and imaging follow-up is recommended. If the process does not resolve, or worsens on antibiotic therapy, Pulmonary Medicine evaluation is recommended.  The area of mass-like opacity measured in the anterior aspect of the lateral left lower lobe is again visualized. This is  also somewhat geographic in appearance and may not represent a focal mass. Focal atelectasis and scarring is possible. Malignancy is not completely excluded. A 6-7 mm anterior right upper lobe nodule is stable since 2014. A 5 mm anterior left upper lobe nodule is stable since 2014. A 5 mm inferior lingular nodule is stable since 2014. 4 mm subpleural nodule in the right middle lobe is stable since 2014. There are 2 tiny subpleural nodules in the posterior right lower lobe. One of these located more medially was not as apparent on the 2014 study. Tiny lateral subpleural nodules in the right lower lobe appears stable since the prior study in 2014.  No airway obstruction. Mildly prominent right hilar lymph node measures approximately 11 mm in short axis. There are a few tiny additional mediastinal nodes with no enlarged lymph nodes seen. No pleural or pericardial fluid is seen. The heart size is normal. Mild spondylosis present of the thoracic spine.  CT ABDOMEN AND PELVIS FINDINGS  Solid organs show no evidence of metastatic disease. The liver, pancreas, spleen, adrenal glands and kidneys are normal. There is a small hiatal hernia. The gallbladder has been removed. No enlarged lymph nodes are seen.  Bowel is unremarkable and shows no evidence of obstruction or lesion. No free fluid, free air or abscess is identified. Small right inguinal hernia contains fat. The bladder is unremarkable. The uterus has been removed. The abdominal aorta and iliac arteries show atherosclerotic calcifications without aneurysmal disease or significant obstructive disease identified. No bony lesions are identified.  IMPRESSION: 1. The geographic area of peripheral consolidation in the right upper lobe has an appearance most likely consistent with necrotic pneumonia with additional small early abscess formation measuring 1.5 cm within this region. Although malignancy cannot be excluded, necrotic pneumonia is favored. Additional alveolar and  ground-glass infiltrate extends into the anterior right upper lobe and right middle lobe. If the process does not resolve or worsens, pulmonary Medicine evaluation is recommended. Associated 11 mm right hilar lymph node may be reactive or metastatic. 2. The peripheral lateral left lower lobe opacity may represent focal atelectasis and scarring. Malignancy cannot be excluded, however. Once the pneumonia of the right upper lobe has been adequately treated, if there is persistent opacity in the left lower lobe, PET scan evaluation may be helpful. 3.  All of the subcentimeter small nodules identified bilaterally are largely peripheral/subpleural and are stable since the CT in 2014. One of the right lower lobe nodules may be new/more prominent but only measures a few mm in diameter. All of these tiny nodules are most likely postinflammatory and not representative of metastatic nodules. 4. No evidence of pathology in the abdomen or pelvis. No acute findings or evidence of metastatic disease. Incidental small hiatal hernia and small right inguinal hernia containing fat.   Electronically Signed   By: Aletta Edouard M.D.   On: 01/22/2015 13:45   Ct Abdomen Pelvis W Contrast  01/22/2015   CLINICAL DATA:  Right upper lobe lung mass with productive cough and chest pain.  EXAM: CT CHEST, ABDOMEN, AND PELVIS WITH CONTRAST  TECHNIQUE: Multidetector CT imaging of the chest, abdomen and pelvis was performed following the standard protocol during bolus administration of intravenous contrast.  CONTRAST:  166mL OMNIPAQUE IOHEXOL 300 MG/ML  SOLN  COMPARISON:  Unenhanced CT of the chest earlier today at 737 hours and prior CT on 10/14/2012.  FINDINGS: CT CHEST FINDINGS  Geographic area of peripheral consolidation in the lateral and posterior right upper lobe spans over a region measuring roughly 3.7 x 7.3 x 4.4 cm. Areas of gas formation are consistent with necrosis and there is a small posterior component of focal fluid attenuation  measuring roughly 1.5 cm in diameter. Based on the contrast-enhanced CT, this may represent a necrotic pneumonia and is not necessarily representative of malignancy. The small area of liquefaction may represent early focal abscess formation. Additional ground-glass and alveolar opacity in the anterior right upper lobe and right middle lobe likely represent acute infection and extension of infiltrate. Clinical and imaging follow-up is recommended. If the process does not resolve, or worsens on antibiotic therapy, Pulmonary Medicine evaluation is recommended.  The area of mass-like opacity measured in the anterior aspect of the lateral left lower lobe is again visualized. This is also somewhat geographic in appearance and may not represent a focal mass. Focal atelectasis and scarring is possible. Malignancy is not completely excluded. A 6-7 mm anterior right upper lobe nodule is stable since 2014. A 5 mm anterior left upper lobe nodule is stable since 2014. A 5 mm inferior lingular nodule is stable since 2014. 4 mm subpleural nodule in the right middle lobe is stable since 2014. There are 2 tiny subpleural nodules in the posterior right lower lobe. One of these located more medially was not as apparent on the 2014 study. Tiny lateral subpleural nodules in the right lower lobe appears stable since the prior study in 2014.  No airway obstruction. Mildly prominent right hilar lymph node measures approximately 11 mm in short axis. There are a few tiny additional mediastinal nodes with no enlarged lymph nodes seen. No pleural or pericardial fluid is seen. The heart size is normal. Mild spondylosis present of the thoracic spine.  CT ABDOMEN AND PELVIS FINDINGS  Solid organs show no evidence of metastatic disease. The liver, pancreas, spleen, adrenal glands and kidneys are normal. There is a small hiatal hernia. The gallbladder has been removed. No enlarged lymph nodes are seen.  Bowel is unremarkable and shows no evidence  of obstruction or lesion. No free fluid, free air or abscess is identified. Small right inguinal hernia contains fat. The bladder is unremarkable. The uterus has been removed. The abdominal aorta and iliac arteries show atherosclerotic calcifications without aneurysmal disease or significant obstructive disease identified. No bony lesions are identified.  IMPRESSION: 1. The geographic area of peripheral consolidation in the right upper lobe has an appearance most likely consistent with necrotic pneumonia with additional small early abscess formation measuring 1.5 cm within this region. Although malignancy cannot be excluded, necrotic pneumonia is favored. Additional alveolar and ground-glass infiltrate extends into the anterior right upper lobe and right middle lobe. If the process does not resolve or worsens, pulmonary Medicine evaluation is recommended. Associated 11 mm right hilar lymph node may be reactive or metastatic. 2. The peripheral lateral left lower lobe opacity may represent focal atelectasis and scarring. Malignancy cannot be excluded, however. Once the pneumonia of the right upper lobe has been adequately treated, if there is persistent opacity in the left lower lobe, PET scan evaluation may be helpful. 3. All of the subcentimeter small nodules identified bilaterally are largely peripheral/subpleural and are stable since the CT in 2014. One of the right lower lobe nodules may be new/more prominent but only measures a few mm in diameter. All of these tiny nodules are most likely postinflammatory and not representative of metastatic nodules. 4. No evidence of pathology in the abdomen or pelvis. No acute findings or evidence of metastatic disease. Incidental small hiatal hernia and small right inguinal hernia containing fat.   Electronically Signed   By: Aletta Edouard M.D.   On: 01/22/2015 13:45   Dg C-arm 1-60 Min-no Report  02/02/2015   CLINICAL DATA: Bronch   C-ARM 1-60 MINUTES  Fluoroscopy was  utilized by the requesting physician.  No radiographic  interpretation.      CBC  Recent Labs Lab 01/28/15 1016  WBC 8.2  HGB 9.2*  HCT 28.3*  PLT 349  MCV 94.7  MCH 30.8  MCHC 32.6  RDW 15.8*  LYMPHSABS 0.6*  MONOABS 0.3  EOSABS 0.0  BASOSABS 0.0    Chemistries   Recent Labs Lab 01/30/15 0158 01/31/15 0418 02/01/15 0452  CREATININE 1.15* 1.07* 1.00   ------------------------------------------------------------------------------------------------------------------ estimated creatinine clearance is 52.3 mL/min (by C-G formula based on Cr of 1). ------------------------------------------------------------------------------------------------------------------ No results for input(s): HGBA1C in the last 72 hours. ------------------------------------------------------------------------------------------------------------------ No results for input(s): CHOL, HDL, LDLCALC, TRIG, CHOLHDL, LDLDIRECT in the last 72 hours. ------------------------------------------------------------------------------------------------------------------ No results for input(s): TSH, T4TOTAL, T3FREE, THYROIDAB in the last 72 hours.  Invalid input(s): FREET3 ------------------------------------------------------------------------------------------------------------------ No results for input(s): VITAMINB12, FOLATE, FERRITIN, TIBC, IRON, RETICCTPCT in the last 72 hours.  Coagulation profile No results for input(s): INR, PROTIME in the last 168 hours.  No results for input(s): DDIMER in the last 72 hours.  Cardiac Enzymes No results for input(s): CKMB, TROPONINI, MYOGLOBIN in the last 168 hours.  Invalid input(s): CK ------------------------------------------------------------------------------------------------------------------ Invalid input(s): POCBNP    Assessment & Plan   Active Problems:   Necrotizing pneumonia IMPRESSION AND PLAN: Patient is a 59 year old African-American female  with history of nicotine addiction, COPD presents with right-sided chest pain noted to have a mass as well as possible pneumonia.Intial ct scan with necrotizing pna, was treated with iv abx no improvement repeat ct showes worsening pna  #1.  Necorotizing pna-  ,  On clindamycin, Cipro 2.. COPD; there is no evidence of acute exacerbation continue her on Advair and Spiriva. And Combivent inhalers as needed.  #3. Peripheral vascular disease;   hold lovenox for biopsy,continue statins  #4. Nicotine addiction smoking cessation done 5.htn; hypotensive now hold BP medications  6. Rectal bleeding likely due to hemorrhoids 4 GI consult ,hold  the discharge continue IV fluids monitor on off  unit telemetry and  hold Lovenox.    Code Status Orders        Start     Ordered   01/22/15 0947  Full code   Continuous     01/22/15 0948      Family Communication: Patient   Disposition Plan: Home   Procedures none   Consults pulmonary consult , ID consult   DVT Prophylaxis  SCDs  Lab Results  Component Value Date   PLT 349 01/28/2015     Time Spent in minutes 9 minutes   Anaaya Fuster M.D on 02/03/2015 at 11:15 AM  Between 7am to 6pm - Pager - [phone]  After 6pm go to [email or web address] - password [email or web address] [facility]   Office  [phone]

## 2015-02-03 NOTE — Discharge Instructions (Signed)

## 2015-02-03 NOTE — Plan of Care (Signed)
Problem: Discharge Progression Outcomes Goal: Discharge plan in place and appropriate Individualization  Outcome: Progressing Pt goes by [name], lives at home, has a hx of Myocardial infarct, negative cardiac cath, COPD (chronic obstructive pulmonary disease), Hypertension, Peripheral vascular disease, Nicotine addiction. Pt is on home medication.               Goal: Other Discharge Outcomes/Goals Outcome: Completed/Met Date Met:  02/03/15 Pt alert and oriented, no complaints of pain, n/v. Independent to and from bathroom. Calls out and voices any needs

## 2015-02-03 NOTE — Progress Notes (Signed)
Pt reports up to BR and saved stool. Toilet water bloody red with loose stool material; unable to visualize stool color due to blood in water. Pt denies co's/changes. Reports "it's always like that"; "my hemorrhoids."

## 2015-02-04 ENCOUNTER — Telehealth: Payer: Self-pay | Admitting: Urgent Care

## 2015-02-04 DIAGNOSIS — K625 Hemorrhage of anus and rectum: Secondary | ICD-10-CM

## 2015-02-04 DIAGNOSIS — D649 Anemia, unspecified: Secondary | ICD-10-CM

## 2015-02-04 LAB — CBC
HCT: 27.7 % — ABNORMAL LOW (ref 35.0–47.0)
HCT: 27.5 % — ABNORMAL LOW (ref 35.0–47.0)
Hemoglobin: 8.8 g/dL — ABNORMAL LOW (ref 12.0–16.0)
Hemoglobin: 8.9 g/dL — ABNORMAL LOW (ref 12.0–16.0)
MCH: 29.2 pg (ref 26.0–34.0)
MCH: 29.9 pg (ref 26.0–34.0)
MCHC: 31.7 g/dL — ABNORMAL LOW (ref 32.0–36.0)
MCHC: 32.2 g/dL (ref 32.0–36.0)
MCV: 92.2 fL (ref 80.0–100.0)
MCV: 92.8 fL (ref 80.0–100.0)
Platelets: 647 10*3/uL — ABNORMAL HIGH (ref 150–440)
Platelets: 621 10*3/uL — ABNORMAL HIGH (ref 150–440)
RBC: 3 MIL/uL — ABNORMAL LOW (ref 3.80–5.20)
RBC: 2.96 MIL/uL — ABNORMAL LOW (ref 3.80–5.20)
RDW: 16.4 % — AB (ref 11.5–14.5)
RDW: 16.9 % — ABNORMAL HIGH (ref 11.5–14.5)
WBC: 11.6 10*3/uL — ABNORMAL HIGH (ref 3.6–11.0)
WBC: 9.9 10*3/uL (ref 3.6–11.0)

## 2015-02-04 MED ORDER — CIPROFLOXACIN IN D5W 400 MG/200ML IV SOLN
400.0000 mg | Freq: Two times a day (BID) | INTRAVENOUS | Status: DC
  Filled 2015-02-04 (×2): qty 200

## 2015-02-04 MED ORDER — CLINDAMYCIN PHOSPHATE 300 MG/50ML IV SOLN
300.0000 mg | Freq: Three times a day (TID) | INTRAVENOUS | Status: DC
  Administered 2015-02-04: 15:00:00 300 mg via INTRAVENOUS
  Filled 2015-02-04 (×5): qty 50

## 2015-02-04 MED ORDER — HYDROCORTISONE 2.5 % RE CREA: 1.0000 "application " | TOPICAL_CREAM | Freq: Two times a day (BID) | RECTAL | Status: DC

## 2015-02-04 NOTE — Progress Notes (Signed)
  Subjective: Pt had one bloody stool last night & 2 episodes around 6am today, however hgb is stable.  Denies diarrhea, abdominal pain, nausea, vomiting or weakness  Objective: Vital signs in last 24 hours: Temp:  [98.6 F (37 C)-99.3 F (37.4 C)] 98.8 F (37.1 C) (06/03 1516) Pulse Rate:  [75-86] 78 (06/03 1516) Resp:  [17-18] 17 (06/03 1516) BP: (104-122)/(50-72) 122/68 mmHg (06/03 1516) SpO2:  [95 %-100 %] 100 % (06/03 1516) Last BM Date: 02/04/15 No LMP recorded. Patient has had a hysterectomy. Body mass index is 21.66 kg/(m^2). General:   Alert, pleasant and cooperative in NAD Head:  Normocephalic and atraumatic. Eyes:  Sclera clear, no icterus.   Conjunctiva pink. Mouth:  No deformity or lesions, oropharynx pink & moist. Neck:  Supple; no masses or thyromegaly. Heart:  Regular rate and rhythm; no murmurs, clicks, rubs, or gallops. Abdomen:   Normal bowel sounds.  Soft, nontender and nondistended. No masses, hepatosplenomegaly or hernias noted.  No guarding or rebound tenderness.   Msk:  Symmetrical without gross deformities. Good equal movement & strength bilaterally. Pulses:  Normal pulses noted. Extremities:  Without clubbing or edema.  No cyanosis Neurologic:  Alert and  oriented x3;  grossly normal neurologically. Skin:  Intact without significant lesions or rashes. Cervical Nodes:  No significant cervical adenopathy. Psych:  Alert and cooperative. Normal mood and affect.  Intake/Output from previous day: 06/02 0701 - 06/03 0700 In: 812 [P.O.:237; I.V.:575] Out: 2500 [Urine:2500]  Lab Results:  Recent Labs  02/03/15 2152 02/04/15 0830 02/04/15 1520  WBC 10.8 11.6* 9.9  HGB 8.3* 8.8* 8.9*  HCT 26.0* 27.7* 27.5*  PLT 578* 647* 621*   C-Diff PCR negative  Assessment: Acute onset bloody stools:  Hgb remains stable despite at least 5 bloody stools so I suspect this is from her history of hemorrhoids. She has had no further diarrhea and C. difficile is  negative. I discussed her care with Dr. Vianne Bulls  Plan: #1 Anusol HC twice a day for 10 days #2 she is instructed to follow-up with [place] next week, however she knows she will need to return to the hospital if she continues to have large volume bloody stools or any new GI symptoms   LOS: 13 days  [name]  02/04/2015, 4:27 PM [facility]  [address], [place] [address] Phone: [phone] Fax : [phone]

## 2015-02-04 NOTE — Progress Notes (Signed)
Pt up to the BR.  Toilet water had large amount of bright red blood and loose stool.  Pt again states "it's from her hemorrhoids."  Clarise Cruz, RN

## 2015-02-04 NOTE — Plan of Care (Signed)
Problem: Discharge Progression Outcomes Goal: Other Discharge Outcomes/Goals Outcome: Not Progressing Patient VSS remains stable no bloody diarrhea since 6 am.  GI has ordered bleeding scan but can not be done unless patient is actively bleeding (next bowel movement).  Patient very anxious about having to stay in hospital longer than expected.  Repeating CBC twice today Hg actually remaining stable.  GI has made her NPO she is aware of this.  No other changes at this time.

## 2015-02-04 NOTE — Progress Notes (Signed)
[place] at [facility]                                                                                                                                                                                            Patient Demographics   [name], is a 59 y.o. female, DOB - [birth year], MLJ:[id number]  Admit date - 01/22/2015   Admitting Physician Dustin Flock, MD  Outpatient Primary MD for the patient is No primary care provider on file.  LOS - 58  Chief Complaint  Patient presents with  . Pleurisy    Subjective:  Seen at bedside.had blody  diarrhea this am.just rectal bleed,no stool.she denies any abdominal pain.  Review of Systems:   CONSTITUTIONAL: no fever, weakness. No weight gain, no weight loss.  EYES: No blurry or double vision.  ENT: No tinnitus. No postnasal drip. No redness of the oropharynx.  RESPIRATORY: Dry cough, no wheeze, no hemoptysis. Mild dyspnea.  CARDIOVASCULAR: Right-sided chest pain. No orthopnea. No palpitations. No syncope.  GASTROINTESTINAL: black stool.today.no abdominal pain,. GENITOURINARY: No dysuria or hematuria.  ENDOCRINE: No polyuria or nocturia. No heat or cold intolerance.  HEMATOLOGY: No anemia. No bruising. No bleeding.  INTEGUMENTARY: No rashes. No lesions.  MUSCULOSKELETAL: No arthritis. No swelling. No gout.  NEUROLOGIC: No numbness, tingling, or ataxia. No seizure-type activity.  PSYCHIATRIC: No anxiety. No insomnia. No ADD.    Vitals:   Filed Vitals:   02/03/15 1802 02/03/15 2050 02/04/15 0437 02/04/15 1024  BP: 107/57 104/50 114/72 111/68  Pulse: 75 86 82 75  Temp:  98.6 F (37 C) 99.3 F (37.4 C)   TempSrc:  Oral Oral   Resp:  18 18   Height:      Weight:      SpO2:  96% 95%     Wt Readings from Last 3 Encounters:  01/22/15 57.267 kg (126 lb 4 oz)     Intake/Output Summary (Last 24 hours) at 02/04/15 1213 Last data filed at 02/04/15 0800  Gross per 24 hour  Intake   1052 ml   Output   2500 ml  Net  -1448 ml    Physical Exam:   GENERAL: Pleasant-appearing in no apparent distress.  HEAD, EYES, EARS, NOSE AND THROAT: Atraumatic, normocephalic. Extraocular muscles are intact. Pupils equal and reactive to light. Sclerae anicteric. No conjunctival injection. No oro-pharyngeal erythema.  NECK: Supple. There is no jugular venous distention. No bruits, no lymphadenopathy, no thyromegaly.  HEART: Regular rate and rhythm, tachycardic. No murmurs, no rubs, no clicks.  LUNGS: Occasional rhonchi. No rales or  No wheezes.  ABDOMEN: Soft,  flat, nontender, nondistended. Has good bowel sounds. No hepatosplenomegaly appreciated.  EXTREMITIES: No evidence of any cyanosis, clubbing, or peripheral edema.  +2 pedal and radial pulses bilaterally.  NEUROLOGIC: The patient is alert, awake, and oriented x3 with no focal motor or sensory deficits appreciated bilaterally.  SKIN: Moist and warm with no rashes appreciated.  Psych: Not anxious, depressed LN: No inguinal LN enlargement    Antibiotics   Anti-infectives    Start     Dose/Rate Route Frequency Ordered Stop   02/03/15 0000  ciprofloxacin (CIPRO) 500 MG tablet     500 mg Oral 2 times daily 02/03/15 0815     02/03/15 0000  clindamycin (CLEOCIN) 300 MG capsule     300 mg Oral Every 8 hours 02/03/15 0815     02/02/15 2000  ciprofloxacin (CIPRO) tablet 500 mg     500 mg Oral 2 times daily 02/02/15 1500     02/02/15 1515  clindamycin (CLEOCIN) capsule 300 mg     300 mg Oral 3 times per day 02/02/15 1500     02/01/15 2300  vancomycin (VANCOCIN) IVPB 1000 mg/200 mL premix  Status:  Discontinued     1,000 mg 200 mL/hr over 60 Minutes Intravenous Every 12 hours 02/01/15 2224 02/02/15 1456   01/30/15 1600  vancomycin (VANCOCIN) 1,250 mg in sodium chloride 0.9 % 250 mL IVPB  Status:  Discontinued     1,250 mg 166.7 mL/hr over 90 Minutes Intravenous Every 18 hours 01/30/15 0346 02/01/15 2224   01/30/15 0330  vancomycin (VANCOCIN)  1,250 mg in sodium chloride 0.9 % 250 mL IVPB  Status:  Discontinued     1,250 mg 166.7 mL/hr over 90 Minutes Intravenous Every 18 hours 01/30/15 0321 01/30/15 0346   01/28/15 2200  piperacillin-tazobactam (ZOSYN) IVPB 3.375 g  Status:  Discontinued     3.375 g 12.5 mL/hr over 240 Minutes Intravenous 3 times per day 01/28/15 1401 02/02/15 1456   01/28/15 1600  clindamycin (CLEOCIN) IVPB 600 mg  Status:  Discontinued     600 mg 100 mL/hr over 30 Minutes Intravenous Every 6 hours 01/28/15 1325 02/02/15 1456   01/28/15 1430  vancomycin (VANCOCIN) 1,250 mg in sodium chloride 0.9 % 250 mL IVPB  Status:  Discontinued     1,250 mg 166.7 mL/hr over 90 Minutes Intravenous Every 12 hours 01/28/15 1352 01/28/15 1354   01/28/15 1430  vancomycin (VANCOCIN) 1,500 mg in sodium chloride 0.9 % 500 mL IVPB  Status:  Discontinued     1,500 mg 250 mL/hr over 120 Minutes Intravenous Every 12 hours 01/28/15 1354 01/30/15 0321   01/28/15 1330  piperacillin-tazobactam (ZOSYN) IVPB 3.375 g  Status:  Discontinued     3.375 g 12.5 mL/hr over 240 Minutes Intravenous 4 times per day 01/28/15 1325 01/28/15 1401   01/27/15 2200  amoxicillin-clavulanate (AUGMENTIN) 875-125 MG per tablet 1 tablet  Status:  Discontinued     1 tablet Oral Every 12 hours 01/27/15 1702 01/28/15 1324   01/26/15 0630  vancomycin (VANCOCIN) 1,250 mg in sodium chloride 0.9 % 250 mL IVPB  Status:  Discontinued     1,250 mg 166.7 mL/hr over 90 Minutes Intravenous Every 12 hours 01/26/15 0603 01/28/15 0906   01/22/15 1600  piperacillin-tazobactam (ZOSYN) IVPB 3.375 g  Status:  Discontinued     3.375 g 12.5 mL/hr over 240 Minutes Intravenous 3 times per day 01/22/15 1352 01/27/15 1702   01/22/15 1430  azithromycin (ZITHROMAX) 500 mg in dextrose 5 % 250 mL  IVPB     500 mg 250 mL/hr over 60 Minutes Intravenous  Once 01/22/15 1351 01/22/15 1606   01/22/15 1400  piperacillin-tazobactam (ZOSYN) IVPB 3.375 g  Status:  Discontinued     3.375 g 12.5  mL/hr over 240 Minutes Intravenous 3 times per day 01/22/15 0951 01/22/15 1352   01/22/15 1200  vancomycin (VANCOCIN) IVPB 1000 mg/200 mL premix  Status:  Discontinued     1,000 mg 200 mL/hr over 60 Minutes Intravenous Every 18 hours 01/22/15 1124 01/26/15 0603   01/22/15 1000  vancomycin (VANCOCIN) IVPB 1000 mg/200 mL premix  Status:  Discontinued     1,000 mg 200 mL/hr over 60 Minutes Intravenous Every 24 hours 01/22/15 0951 01/22/15 1124   01/22/15 0845  cefTRIAXone (ROCEPHIN) 1 g in dextrose 5 % 50 mL IVPB     1 g 100 mL/hr over 30 Minutes Intravenous  Once 01/22/15 0843 01/22/15 0931   01/22/15 0845  azithromycin (ZITHROMAX) 500 mg in dextrose 5 % 250 mL IVPB  Status:  Discontinued     500 mg 250 mL/hr over 60 Minutes Intravenous  Once 01/22/15 0843 01/22/15 1351      Medications   Scheduled Meds: . atorvastatin  80 mg Oral [id number]  . butamben-tetracaine-benzocaine  1 spray Topical Once  . ciprofloxacin  500 mg Oral BID  . clindamycin  300 mg Oral 3 times per day  . feeding supplement (ENSURE ENLIVE)  237 mL Oral TID WC  . ferrous sulfate  325 mg Oral Daily  . guaiFENesin  600 mg Oral BID  . lidocaine  1 application Topical Once  . metoprolol  100 mg Oral Daily  . polyethylene glycol  17 g Oral Daily  . zolpidem  5 mg Oral QHS   Continuous Infusions: . sodium chloride 75 mL/hr at 02/04/15 0104   PRN Meds:.acetaminophen **OR** acetaminophen, calcium carbonate, guaiFENesin-codeine, HYDROcodone-acetaminophen, lactulose, menthol-cetylpyridinium, morphine injection, nicotine polacrilex, ondansetron **OR** ondansetron (ZOFRAN) IV, phenylephrine, senna-docusate, zolpidem   Data Review:   Micro Results Recent Results (from the past 240 hour(s))  Fungus Culture with Smear     Status: None (Preliminary result)   Collection Time: 02/02/15  1:30 PM  Result Value Ref Range Status   Specimen Description BRONCHIAL BRUSHING  Final   Special Requests Normal  Final   Culture NO GROWTH  2 DAYS  Final   Report Status PENDING  Incomplete  Culture, respiratory (NON-Expectorated)     Status: None (Preliminary result)   Collection Time: 02/02/15  1:30 PM  Result Value Ref Range Status   Specimen Description BRONCHIAL BRUSHING  Final   Special Requests Normal  Final   Gram Stain PENDING  Incomplete   Culture NO GROWTH < 24 HOURS  Final   Report Status PENDING  Incomplete  C difficile quick scan w PCR reflex [facility])     Status: None   Collection Time: 02/03/15  6:02 PM  Result Value Ref Range Status   C Diff antigen NEGATIVE  Final   C Diff toxin NEGATIVE  Final   C Diff interpretation Negative for C. difficile  Final    Radiology Reports Dg Chest 2 View  02/02/2015   CLINICAL DATA:  Post bronchoscopy.  Biopsy.  EXAM: CHEST  2 VIEW  COMPARISON:  02/01/2015  FINDINGS: Large area of consolidation again noted in the right upper lobe. This is unchanged. No pneumothorax following bronchoscopy. Scarring or atelectasis at the left base. Heart is normal size. No effusions or acute bony abnormality.  IMPRESSION: No pneumothorax following biopsy. Stable right upper lobe masslike area consolidation.   Electronically Signed   By: Rolm Baptise M.D.   On: 02/02/2015 14:48   Dg Chest 2 View  02/01/2015   CLINICAL DATA:  Follow-up pneumonia. Cough, congestion and chest pain.  EXAM: CHEST  2 VIEW  COMPARISON:  01/28/2015 CT and prior exams.  FINDINGS: Mild cardiomegaly again noted.  Right upper lobe consolidation/airspace disease has improved.  Emphysema/COPD again noted  There is no evidence of pneumothorax or pleural effusion.  No acute bony abnormalities are noted.  IMPRESSION: Improved right upper lobe consolidation/airspace disease compatible with pneumonia. Continued radiographic follow-up to resolution recommended.   Electronically Signed   By: Margarette Canada M.D.   On: 02/01/2015 20:46   Dg Chest 2 View  01/27/2015   CLINICAL DATA:  Shortness of breath and cough, followup  EXAM: CHEST  2 VIEW   COMPARISON:  CT chest of 01/22/2015 and chest x-ray of 01/22/2015  FINDINGS: There does appear to have been some interval worsening of the consolidation of a portion of the right upper lobe with areas of cavitation suggesting necrotic pneumonia. Vague opacity at the left costophrenic angle may represent scarring or patchy pneumonia as well. No definite effusion is seen. Heart size is normal. No bony abnormality is seen.  IMPRESSION: Perhaps slight worsening of right upper lobe opacity most consistent with necrotic pneumonia with areas of apparent cavitation.   Electronically Signed   By: Ivar Drape M.D.   On: 01/27/2015 08:05   Dg Chest 2 View  01/22/2015   CLINICAL DATA:  Pleurisy. Right-sided pain, back pain for 1 week. History of COPD.  EXAM: CHEST  2 VIEW  COMPARISON:  10/15/2013  FINDINGS: Within the posterior right upper lobe is a a 4.7 cm rounded masslike opacity, with surrounding ill-defined density. Additional ill-defined density in the right middle lobe. Underlying emphysema again seen. Probable atelectasis or scarring at the left costophrenic angle. The heart size and mediastinal contours are normal, mild atherosclerosis of the thoracic aorta. No pleural effusion. No pneumothorax. There is degenerative change in the spine.  IMPRESSION: Rounded 4.7 since masslike opacity in the right upper lobe, with surrounding ill-defined density. This may reflect pneumonia versus neoplasm. There is additional opacity in the right middle lobe concerning for pneumonia.  Chest CT correlation versus radiographic follow-up recommended. While traditional follow-up for pneumonia would be in 3-4 weeks after course of antibiotics, given the masslike appearance, shorter interval follow-up is recommended in 1-2 weeks.   Electronically Signed   By: Jeb Levering M.D.   On: 01/22/2015 06:42   Ct Chest Wo Contrast  01/22/2015   CLINICAL DATA:  Five day history of right-sided chest pain, worsening over the past 2 days. Right  upper lobe lung mass on earlier chest x-ray.  EXAM: CT CHEST WITHOUT CONTRAST  TECHNIQUE: Multidetector CT imaging of the chest was performed following the standard protocol without IV contrast.  COMPARISON:  CT chest 10/14/2012. Two-view chest x-ray earlier same date.  FINDINGS: Lack of intravenous contrast makes the examination less sensitive than it would be otherwise. Severe emphysematous changes throughout both lungs with dense masslike opacity in the right upper lobe. Gas bubbles within the opacity. Extensive bullous changes are present in the right upper lobe in the area of the opacity. Patchy airspace opacities are present more inferiorly in the right lower lobe. Minimal patchy airspace opacities anteriorly in the right middle lobe. Masslike pleural-based opacity in the inferior left lower  lobe measuring approximately 1.8 x 2.1 x 2.0 cm.  Multiple bilateral lung nodules, many of which are new since the prior CT. The largest nodule is in the inferior left upper lobe and measures approximately 5 mm (series 3, image 30).  No associated pleural effusions.  No pleural masses.  Normal heart size with extensive 3 vessel coronary atherosclerosis. Severe atherosclerosis involving the thoracic and upper abdominal aorta and their visualized branches without evidence of aneurysm. No visible mediastinal, hilar or axillary lymphadenopathy. Visualized thyroid gland unremarkable.  Visualized upper abdomen unremarkable. 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  IMPRESSION: 1. 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 2. Approximate 2 cm mass in the inferior left lower lobe. 3. Numerous bilateral pulmonary nodules, the largest approximating 5 mm in the left lower lobe. 4. Severe COPD/emphysema. 5. No visible mediastinal, hilar or axillary lymphadenopathy, allowing for the  unenhanced technique.   Electronically Signed   By: Evangeline Dakin M.D.   On: 01/22/2015 08:29   Ct Chest W Contrast  01/28/2015   CLINICAL DATA:  Followup right upper lobe lung process.  EXAM: CT CHEST WITH CONTRAST  TECHNIQUE: Multidetector CT imaging of the chest was performed during intravenous contrast administration.  CONTRAST:  65mL OMNIPAQUE IOHEXOL 300 MG/ML  SOLN  COMPARISON:  CT scans 01/22/2015  FINDINGS: Chest wall: No breast masses, supraclavicular or axillary lymphadenopathy. The thyroid gland appears normal. The bony thorax is intact. No destructive bone lesions or spinal canal compromise.  Mediastinum: The heart is normal in size. No pericardial effusion. Stable advanced atherosclerotic calcifications involving the aorta and branch vessels including the coronary arteries. The esophagus is grossly normal.  Stable small mediastinal lymph nodes. The right hilar node has enlarged slightly. It measures 20 x 21 mm on image number 25.  Lungs/pleura: There is a persistent and progressive necrotic appearing right upper lobe pneumonia. It previously measured a maximum of 7.3 x 2.8 cm and now measures 10.7 x 5.8 cm. Small abscesses are suspected. I do not see the discrete rim enhancing small abscess opposed demonstrated on the prior scan. There is also severe surrounding inflammation in the right lung with marked interstitial thickening and ground-glass opacity. I do not see a discrete mass or endobronchial lesion to suggest neoplasm.  There are numerous small bilateral pulmonary nodules which are unchanged. The nodular density at the left lung base adjacent to the major fissure in the left lower lobe measures approximately 11 mm. Recommend continued observation.  Upper abdomen:  No significant findings.  IMPRESSION: 1. Worsening necrotic appearing right upper lobe pneumonia with possible small abscesses. Surrounding pneumonitis is also progressive. 2. Slight interval enlargement of right hilar lymph  nodes. 3. Stable bilateral pulmonary nodules. 4. Bronchoscopy may be helpful for further evaluation. Repeat chest CT after appropriate antibiotic therapy is recommended.   Electronically Signed   By: Marijo Sanes M.D.   On: 01/28/2015 10:29   Ct Chest W Contrast  01/22/2015   CLINICAL DATA:  Right upper lobe lung mass with productive cough and chest pain.  EXAM: CT CHEST, ABDOMEN, AND PELVIS WITH CONTRAST  TECHNIQUE: Multidetector CT imaging of the chest, abdomen and pelvis was performed following the standard protocol during bolus administration of intravenous contrast.  CONTRAST:  119mL OMNIPAQUE IOHEXOL 300 MG/ML  SOLN  COMPARISON:  Unenhanced CT of the chest earlier today at 737 hours and prior CT on 10/14/2012.  FINDINGS: CT CHEST FINDINGS  Geographic area of peripheral consolidation in the lateral and posterior right upper lobe spans over a region measuring roughly 3.7 x 7.3 x 4.4 cm. Areas of gas formation are consistent with necrosis and there is a small posterior component of focal fluid attenuation measuring roughly 1.5 cm in diameter. Based on the contrast-enhanced CT, this may represent a necrotic pneumonia and is not necessarily representative of malignancy. The small area of liquefaction may represent early focal abscess formation. Additional ground-glass and alveolar opacity in the anterior right upper lobe and right middle lobe likely represent acute infection and extension of infiltrate. Clinical and imaging follow-up is recommended. If the process does not resolve, or worsens on antibiotic therapy, Pulmonary Medicine evaluation is recommended.  The area of mass-like opacity measured in the anterior aspect of the lateral left lower lobe is again visualized. This is also somewhat geographic in appearance and may not represent a focal mass. Focal atelectasis and scarring is possible. Malignancy is not completely excluded. A 6-7 mm anterior right upper lobe nodule is stable since 2014. A 5 mm  anterior left upper lobe nodule is stable since 2014. A 5 mm inferior lingular nodule is stable since 2014. 4 mm subpleural nodule in the right middle lobe is stable since 2014. There are 2 tiny subpleural nodules in the posterior right lower lobe. One of these located more medially was not as apparent on the 2014 study. Tiny lateral subpleural nodules in the right lower lobe appears stable since the prior study in 2014.  No airway obstruction. Mildly prominent right hilar lymph node measures approximately 11 mm in short axis. There are a few tiny additional mediastinal nodes with no enlarged lymph nodes seen. No pleural or pericardial fluid is seen. The heart size is normal. Mild spondylosis present of the thoracic spine.  CT ABDOMEN AND PELVIS FINDINGS  Solid organs show no evidence of metastatic disease. The liver, pancreas, spleen, adrenal glands and kidneys are normal. There is a small hiatal hernia. The gallbladder has been removed. No enlarged lymph nodes are seen.  Bowel is unremarkable and shows no evidence of obstruction or lesion. No free fluid, free air or abscess is identified. Small right inguinal hernia contains fat. The bladder is unremarkable. The uterus has been removed. The abdominal aorta and iliac arteries show atherosclerotic calcifications without aneurysmal disease or significant obstructive disease identified. No bony lesions are identified.  IMPRESSION: 1. The geographic area of peripheral consolidation in the right upper lobe has an appearance most likely consistent with necrotic pneumonia with additional small early abscess formation measuring 1.5 cm within this region. Although malignancy cannot be excluded, necrotic pneumonia is favored. Additional alveolar and ground-glass infiltrate extends into the anterior right upper lobe and right middle lobe. If the process does not resolve or worsens, pulmonary Medicine evaluation is recommended. Associated 11 mm right hilar lymph node may be  reactive or metastatic. 2. The peripheral lateral left lower lobe opacity may represent focal atelectasis and scarring. Malignancy cannot be excluded, however. Once the pneumonia of the right upper lobe has been adequately treated, if there is persistent opacity in the left lower lobe, PET scan evaluation may be helpful. 3. All of the subcentimeter small nodules identified bilaterally are largely peripheral/subpleural and are stable since the CT in 2014. One of the right lower lobe nodules may be new/more prominent but only measures a few mm in diameter. All of these tiny nodules are most likely postinflammatory and not representative of  metastatic nodules. 4. No evidence of pathology in the abdomen or pelvis. No acute findings or evidence of metastatic disease. Incidental small hiatal hernia and small right inguinal hernia containing fat.   Electronically Signed   By: Aletta Edouard M.D.   On: 01/22/2015 13:45   Ct Abdomen Pelvis W Contrast  01/22/2015   CLINICAL DATA:  Right upper lobe lung mass with productive cough and chest pain.  EXAM: CT CHEST, ABDOMEN, AND PELVIS WITH CONTRAST  TECHNIQUE: Multidetector CT imaging of the chest, abdomen and pelvis was performed following the standard protocol during bolus administration of intravenous contrast.  CONTRAST:  183mL OMNIPAQUE IOHEXOL 300 MG/ML  SOLN  COMPARISON:  Unenhanced CT of the chest earlier today at 737 hours and prior CT on 10/14/2012.  FINDINGS: CT CHEST FINDINGS  Geographic area of peripheral consolidation in the lateral and posterior right upper lobe spans over a region measuring roughly 3.7 x 7.3 x 4.4 cm. Areas of gas formation are consistent with necrosis and there is a small posterior component of focal fluid attenuation measuring roughly 1.5 cm in diameter. Based on the contrast-enhanced CT, this may represent a necrotic pneumonia and is not necessarily representative of malignancy. The small area of liquefaction may represent early focal  abscess formation. Additional ground-glass and alveolar opacity in the anterior right upper lobe and right middle lobe likely represent acute infection and extension of infiltrate. Clinical and imaging follow-up is recommended. If the process does not resolve, or worsens on antibiotic therapy, Pulmonary Medicine evaluation is recommended.  The area of mass-like opacity measured in the anterior aspect of the lateral left lower lobe is again visualized. This is also somewhat geographic in appearance and may not represent a focal mass. Focal atelectasis and scarring is possible. Malignancy is not completely excluded. A 6-7 mm anterior right upper lobe nodule is stable since 2014. A 5 mm anterior left upper lobe nodule is stable since 2014. A 5 mm inferior lingular nodule is stable since 2014. 4 mm subpleural nodule in the right middle lobe is stable since 2014. There are 2 tiny subpleural nodules in the posterior right lower lobe. One of these located more medially was not as apparent on the 2014 study. Tiny lateral subpleural nodules in the right lower lobe appears stable since the prior study in 2014.  No airway obstruction. Mildly prominent right hilar lymph node measures approximately 11 mm in short axis. There are a few tiny additional mediastinal nodes with no enlarged lymph nodes seen. No pleural or pericardial fluid is seen. The heart size is normal. Mild spondylosis present of the thoracic spine.  CT ABDOMEN AND PELVIS FINDINGS  Solid organs show no evidence of metastatic disease. The liver, pancreas, spleen, adrenal glands and kidneys are normal. There is a small hiatal hernia. The gallbladder has been removed. No enlarged lymph nodes are seen.  Bowel is unremarkable and shows no evidence of obstruction or lesion. No free fluid, free air or abscess is identified. Small right inguinal hernia contains fat. The bladder is unremarkable. The uterus has been removed. The abdominal aorta and iliac arteries show  atherosclerotic calcifications without aneurysmal disease or significant obstructive disease identified. No bony lesions are identified.  IMPRESSION: 1. The geographic area of peripheral consolidation in the right upper lobe has an appearance most likely consistent with necrotic pneumonia with additional small early abscess formation measuring 1.5 cm within this region. Although malignancy cannot be excluded, necrotic pneumonia is favored. Additional alveolar and ground-glass infiltrate extends into  the anterior right upper lobe and right middle lobe. If the process does not resolve or worsens, pulmonary Medicine evaluation is recommended. Associated 11 mm right hilar lymph node may be reactive or metastatic. 2. The peripheral lateral left lower lobe opacity may represent focal atelectasis and scarring. Malignancy cannot be excluded, however. Once the pneumonia of the right upper lobe has been adequately treated, if there is persistent opacity in the left lower lobe, PET scan evaluation may be helpful. 3. All of the subcentimeter small nodules identified bilaterally are largely peripheral/subpleural and are stable since the CT in 2014. One of the right lower lobe nodules may be new/more prominent but only measures a few mm in diameter. All of these tiny nodules are most likely postinflammatory and not representative of metastatic nodules. 4. No evidence of pathology in the abdomen or pelvis. No acute findings or evidence of metastatic disease. Incidental small hiatal hernia and small right inguinal hernia containing fat.   Electronically Signed   By: Aletta Edouard M.D.   On: 01/22/2015 13:45   Dg C-arm 1-60 Min-no Report  02/02/2015   CLINICAL DATA: [place]   C-ARM 1-60 MINUTES  Fluoroscopy was utilized by the requesting physician.  No radiographic  interpretation.      CBC  Recent Labs Lab 02/03/15 1147 02/03/15 2152 02/04/15 0830  WBC 11.0 10.8 11.6*  HGB 8.8* 8.3* 8.8*  HCT 27.5* 26.0* 27.7*  PLT  612* 578* 647*  MCV 92.5 92.9 92.2  MCH 29.6 29.6 29.2  MCHC 32.1 31.9* 31.7*  RDW 16.8* 16.8* 16.4*    Chemistries   Recent Labs Lab 01/30/15 0158 01/31/15 0418 02/01/15 0452  CREATININE 1.15* 1.07* 1.00   ------------------------------------------------------------------------------------------------------------------ estimated creatinine clearance is 52.3 mL/min (by C-G formula based on Cr of 1). ------------------------------------------------------------------------------------------------------------------ No results for input(s): HGBA1C in the last 72 hours. ------------------------------------------------------------------------------------------------------------------ No results for input(s): CHOL, HDL, LDLCALC, TRIG, CHOLHDL, LDLDIRECT in the last 72 hours. ------------------------------------------------------------------------------------------------------------------ No results for input(s): TSH, T4TOTAL, T3FREE, THYROIDAB in the last 72 hours.  Invalid input(s): FREET3 ------------------------------------------------------------------------------------------------------------------ No results for input(s): VITAMINB12, FOLATE, FERRITIN, TIBC, IRON, RETICCTPCT in the last 72 hours.  Coagulation profile No results for input(s): INR, PROTIME in the last 168 hours.  No results for input(s): DDIMER in the last 72 hours.  Cardiac Enzymes No results for input(s): CKMB, TROPONINI, MYOGLOBIN in the last 168 hours.  Invalid input(s): CK ------------------------------------------------------------------------------------------------------------------ Invalid input(s): POCBNP    Assessment & Plan   Active Problems:   Necrotizing pneumonia IMPRESSION AND PLAN: Patient is a 59 year old African-American female with history of nicotine addiction, COPD presents with right-sided chest pain noted to have a mass as well as possible pneumonia.Intial ct scan with necrotizing  pna, was treated with iv abx no improvement repeat ct showes worsening pna  #1.  Necorotizing pna-  ,  On clindamycin, Cipro 2.. COPD; there is no evidence of acute exacerbation continue her on Advair and Spiriva. And Combivent inhalers as needed.  #3. Peripheral vascular disease;   hold lovenox for biopsy,continue statins  #4. Nicotine addiction smoking cessation done 5.htn; hypotensive now hold BP medications  6. Rectal bleeding;ischemic colitis/diverticular bleed?Nuclear medicine scan is ordered after speaking to [facility] follow result.hb stable at present time,wil check cbc again ,npo, Pt is very disappointed that she has to stay,and wants to leave ,if she decides to leave she has to sign out  AMA,    Code Status Orders        Start     Ordered  01/22/15 0947  Full code   Continuous     01/22/15 0948      Family Communication: Patient   Disposition Plan: Home   Procedures none   Consults pulmonary consult , ID consult   DVT Prophylaxis  SCDs  Lab Results  Component Value Date   PLT 647* 02/04/2015     Time Spent in minutes 83 minutes   Ethelwyn Gilbertson M.D on 02/04/2015 at 12:13 PM  Between 7am to 6pm - Pager - [phone]  After 6pm go to [email or web address] - password [email or web address] [facility]   Office  [phone]

## 2015-02-04 NOTE — Telephone Encounter (Signed)
Patient will be discharged today and needs follow-up appointment next week with me for rectal bleeding

## 2015-02-04 NOTE — Progress Notes (Signed)
All discharge instructions reviewed and signed by patient.  Patient agrees to f/u with all 3 appointments and pick up medications from pharmacy.  Patient discharged home via personal vehicle by family member.

## 2015-02-04 NOTE — Progress Notes (Signed)
Nutrition Follow-up  DOCUMENTATION CODES:     INTERVENTION: Medical Food Supplement Therapy: will recommend continuing Ensure Enlive (each supplement provides 350kcal and 20 grams of protein) once diet order advanced  NUTRITION DIAGNOSIS:  Inadequate oral intake related to acute illness as evidenced by meal completion < 25%  GOAL:   (Goal for diet advancement and tolerance as medically able)  MONITOR:   (Energy Intake, Electrolyte and Renal Profile, Pulmonary Profile)  REASON FOR ASSESSMENT:   (Follow-up)    ASSESSMENT:  Pt remains with loose bloody stools, bleeding scan ordered; GI following.   PO Intake: pt now NPO. Per I and O chart pt ate 100% of breakfast this am and 90% of dinner last night.  Medications: Cipro, ferrous sulfate, NS at 71mL/hr  Labs: Nutritional Anemia Profile:  CBC Latest Ref Rng 02/04/2015 02/03/2015 02/03/2015  WBC 3.6 - 11.0 K/uL 11.6(H) 10.8 11.0  Hemoglobin 12.0 - 16.0 g/dL 8.8(L) 8.3(L) 8.8(L)  Hematocrit 35.0 - 47.0 % 27.7(L) 26.0(L) 27.5(L)  Platelets 150 - 440 K/uL 647(H) 578(H) 612(H)   Anthropometrics: Weight trend since admission Filed Weights   01/22/15 0542 01/22/15 1059  Weight: 136 lb (61.689 kg) 126 lb 4 oz (57.267 kg)    BMI:  Body mass index is 21.66 kg/(m^2).   Unable to complete Nutrition-Focused physical exam at this time.    Estimated Nutritional Needs:  Kcal:  1495-1767kcals, BEE: 1133kcals, TEE: (IF 1.1-1.3)(AF 1.2)   Protein:  57-68g protein, (1.0-1.2g/kg)  Fluid:  1433-1773mL of fluid (25-56mL/kg)  Skin:  Reviewed, no issues  Diet Order:  Diet NPO time specified  EDUCATION NEEDS:  No education needs identified at this time   Intake/Output Summary (Last 24 hours) at 02/04/15 1526 Last data filed at 02/04/15 0800  Gross per 24 hour  Intake   1052 ml  Output   1800 ml  Net   -748 ml    Last BM:  6/3 loose bloody stool this am but none today per RN [place],  [place], LDN Pager [phone]

## 2015-02-04 NOTE — Plan of Care (Signed)
Problem: Discharge Progression Outcomes Goal: Other Discharge Outcomes/Goals Outcome: Progressing Plan of care progress to goal for: Pain-no c/o pain Hemodynamically-VSS Complications-pt had large amount of bright red bleeding in stool Diet-pt tolerating diet at this time Activity-pt up to BR unassisted, refused bed alarm

## 2015-02-05 NOTE — Discharge Summary (Signed)
[name], is a 59 y.o. female  DOB 1956/08/31  MRN [id number].  Admission date:  01/22/2015  Admitting Physician  Dustin Flock, MD  Discharge Date:  6/32016   Primary MD  No primary care provider on file.  Recommendations for primary care physician for things to follow:     Admission Diagnosis  Lung mass [R91.8] Pneumonitis [J18.9]   Discharge Diagnosis  Lung mass [R91.8] Pneumonitis [J18.9]    Active Problems:   Necrotizing pneumonia      Past Medical History  Diagnosis Date  . 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negative cardiac cath  . COPD (chronic obstructive pulmonary disease)   . Hypertension   . Peripheral vascular disease   . Nicotine addiction   . Asthma   . Hemorrhoids     Past Surgical History  Procedure Laterality Date  . Endovascular stent insertion Bilateral     Legs  . Cardiac surgery    . Hemorrhoid surgery    . Colonoscopy  12/15/11    [place]->bleeding internal hemorrhoids, otherwise normal       History of present illness and  Hospital Course:     Kindly see H&P for history of present illness and admission details, please review complete Labs, Consult reports and Test reports for all details in brief  HPI  from the history and physical done on the day of admission [name] is a 59 y.o. female with a known history of nicotine addiction COPD tobacco abuse who presents to the ED with complaint of having right-sided chest pain ongoing's for the past few days. Patient also reports that she has been having productive cough yellow sputum in nature also ongoing for the past few days. She has not had any fevers or chills. Has not had any significant wheezing. But has some shortness of breath. She describes the chest pain as being sharp in nature on the right side of her chest worse with taking  deep breaths. Patient came to the ER with these symptoms and had a chest x-ray which showed a possible lung mass. Therefore she had a CT scan of her chest. The CT scan that lung showed a necrotizing tumor periphery in the right upper lobe with adjacent pneumonitis as well as 2 cm mass in the inferior left lower lobe,admitted to hospitalist service for  Pnemonia,started on ABX,isolation- r/o TB  With AFB,  Hospital Course ;pt seen by DR.Heidi Dach ,had bronchoscopy done,did not show mass,cultures are done,seen by ID,he recommended  She can be dced home with 10 days clinda and cipro.cultures for fungus,bacteria are sent from [facility],she will need  To follow up with DR.Sdat khan ,ID. patient air beats were negative 3, the BX but PCR negative. Patient has dense necrotizing pneumonia on bronchi as well. And it will take a long time for her x-ray to improve. Patient is to follow up with Dr. Heidi Dach for follow-up to ensure radiological resolution. 2, patient developed the bloody diarrhea, discharge was canceled on June 2 and the monitored for further blood loss. Patient hemoglobin stayed stable around 8.9. Patient seen by GI Vickey Huger, because of stable hemoglobin without evidence of further [facility] patient discharge with Anusol suppositories for hemorrhoids. She needs to follow-up with GI as an outpatient for colonoscopy. She was ruled out for C. difficile.  3.copd ; continued on Advair, Spiriva. Advised to quit smoking. Hypertension controlled    Discharge Condition: stable   Follow UP  Follow-up Information    Follow  up with Allyne Gee, MD On 02/17/2015.   Specialty:  Internal Medicine   Why:  At 10:30 am.   Contact information:   [place] [address] [phone]       Follow up with Adrian Prows, MD On 02/10/2015.   Specialty:  Infectious Diseases   Why:  At 8:45 am   Contact information:   [place] [address] [phone]       Follow up with Vickey Huger, NP In 1 week.   Specialty:  Nurse Practitioner   Contact information:   [place] [address] [phone]         Discharge Instructions  and  Discharge Medications        Medication List    TAKE these medications        aspirin EC 325 MG tablet  Take 325 mg by mouth daily.     atorvastatin 80 MG tablet  Commonly known as:  LIPITOR  Take 80 mg by mouth every morning.     cetirizine 10 MG tablet  Commonly known as:  ZYRTEC  Take 10 mg by mouth daily.     ciprofloxacin 500 MG tablet  Commonly known as:  CIPRO  Take 1 tablet (500 mg total) by mouth 2 (two) times daily.     clindamycin 300 MG capsule  Commonly known as:  CLEOCIN  Take 1 capsule (300 mg total) by mouth every 8 (eight) hours.     feeding supplement (ENSURE ENLIVE) Liqd  Take 237 mLs by mouth 3 (three) times daily with meals.     ferrous sulfate 325 (65 FE) MG tablet  Take 325 mg by mouth daily.     hydrocortisone 2.5 % rectal cream  Commonly known as:  PROCTOSOL HC  Place 1 application rectally 2 (two) times daily.     losartan 50 MG tablet  Commonly known as:  COZAAR  Take 50 mg by mouth daily.     metoprolol 50 MG tablet  Commonly known as:  LOPRESSOR  Take 100 mg by mouth daily.          Diet and Activity recommendation: See Discharge Instructions above   Consults obtained - GI Pulmonary ID   Major procedures and Radiology Reports - PLEASE review detailed and final reports for all details, in brief -      Dg Chest 2 View  02/02/2015   CLINICAL DATA:  Post bronchoscopy.  Biopsy.  EXAM: CHEST  2 VIEW  COMPARISON:  02/01/2015  FINDINGS: Large area of consolidation again noted in the right upper lobe. This is unchanged. No pneumothorax following bronchoscopy. Scarring or atelectasis at the left base. Heart is normal size. No effusions or acute bony abnormality.  IMPRESSION: No  pneumothorax following biopsy. Stable right upper lobe masslike area consolidation.   Electronically Signed   By: Rolm Baptise M.D.   On: 02/02/2015 14:48   Dg Chest 2 View  02/01/2015   CLINICAL DATA:  Follow-up pneumonia. Cough, congestion and chest pain.  EXAM: CHEST  2 VIEW  COMPARISON:  01/28/2015 CT and prior exams.  FINDINGS: Mild cardiomegaly again noted.  Right upper lobe consolidation/airspace disease has improved.  Emphysema/COPD again noted  There is no evidence of pneumothorax or pleural effusion.  No acute bony abnormalities are noted.  IMPRESSION: Improved right upper lobe consolidation/airspace disease compatible with pneumonia. Continued radiographic follow-up to resolution recommended.   Electronically Signed  By: Margarette Canada M.D.   On: 02/01/2015 20:46   Dg Chest 2 View  01/27/2015   CLINICAL DATA:  Shortness of breath and cough, followup  EXAM: CHEST  2 VIEW  COMPARISON:  CT chest of 01/22/2015 and chest x-ray of 01/22/2015  FINDINGS: There does appear to have been some interval worsening of the consolidation of a portion of the right upper lobe with areas of cavitation suggesting necrotic pneumonia. Vague opacity at the left costophrenic angle may represent scarring or patchy pneumonia as well. No definite effusion is seen. Heart size is normal. No bony abnormality is seen.  IMPRESSION: Perhaps slight worsening of right upper lobe opacity most consistent with necrotic pneumonia with areas of apparent cavitation.   Electronically Signed   By: Ivar Drape M.D.   On: 01/27/2015 08:05   Dg Chest 2 View  01/22/2015   CLINICAL DATA:  Pleurisy. Right-sided pain, back pain for 1 week. History of COPD.  EXAM: CHEST  2 VIEW  COMPARISON:  10/15/2013  FINDINGS: Within the posterior right upper lobe is a a 4.7 cm rounded masslike opacity, with surrounding ill-defined density. Additional ill-defined density in the right middle lobe. Underlying emphysema again seen. Probable atelectasis or scarring  at the left costophrenic angle. The heart size and mediastinal contours are normal, mild atherosclerosis of the thoracic aorta. No pleural effusion. No pneumothorax. There is degenerative change in the spine.  IMPRESSION: Rounded 4.7 since masslike opacity in the right upper lobe, with surrounding ill-defined density. This may reflect pneumonia versus neoplasm. There is additional opacity in the right middle lobe concerning for pneumonia.  Chest CT correlation versus radiographic follow-up recommended. While traditional follow-up for pneumonia would be in 3-4 weeks after course of antibiotics, given the masslike appearance, shorter interval follow-up is recommended in 1-2 weeks.   Electronically Signed   By: Jeb Levering M.D.   On: 01/22/2015 06:42   Ct Chest Wo Contrast  01/22/2015   CLINICAL DATA:  Five day history of right-sided chest pain, worsening over the past 2 days. Right upper lobe lung mass on earlier chest x-ray.  EXAM: CT CHEST WITHOUT CONTRAST  TECHNIQUE: Multidetector CT imaging of the chest was performed following the standard protocol without IV contrast.  COMPARISON:  CT chest 10/14/2012. Two-view chest x-ray earlier same date.  FINDINGS: Lack of intravenous contrast makes the examination less sensitive than it would be otherwise. Severe emphysematous changes throughout both lungs with dense masslike opacity in the right upper lobe. Gas bubbles within the opacity. Extensive bullous changes are present in the right upper lobe in the area of the opacity. Patchy airspace opacities are present more inferiorly in the right lower lobe. Minimal patchy airspace opacities anteriorly in the right middle lobe. Masslike pleural-based opacity in the inferior left lower lobe measuring approximately 1.8 x 2.1 x 2.0 cm.  Multiple bilateral lung nodules, many of which are new since the prior CT. The largest nodule is in the inferior left upper lobe and measures approximately 5 mm (series 3, image 30).  No  associated pleural effusions.  No pleural masses.  Normal heart size with extensive 3 vessel coronary atherosclerosis. Severe atherosclerosis involving the thoracic and upper abdominal aorta and their visualized branches without evidence of aneurysm. No visible mediastinal, hilar or axillary lymphadenopathy. Visualized thyroid gland unremarkable.  Visualized upper abdomen unremarkable. 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  IMPRESSION: 1. Necrotic tumor peripherally in the right upper lobe with adjacent pneumonitis is favored over  necrotizing pneumonia, given the remaining findings discussed below. 2. Approximate 2 cm mass in the inferior left lower lobe. 3. Numerous bilateral pulmonary nodules, the largest approximating 5 mm in the left lower lobe. 4. Severe COPD/emphysema. 5. No visible mediastinal, hilar or axillary lymphadenopathy, allowing for the unenhanced technique.   Electronically Signed   By: Evangeline Dakin M.D.   On: 01/22/2015 08:29   Ct Chest W Contrast  01/28/2015   CLINICAL DATA:  Followup right upper lobe lung process.  EXAM: CT CHEST WITH CONTRAST  TECHNIQUE: Multidetector CT imaging of the chest was performed during intravenous contrast administration.  CONTRAST:  47mL OMNIPAQUE IOHEXOL 300 MG/ML  SOLN  COMPARISON:  CT scans 01/22/2015  FINDINGS: Chest wall: No breast masses, supraclavicular or axillary lymphadenopathy. The thyroid gland appears normal. The bony thorax is intact. No destructive bone lesions or spinal canal compromise.  Mediastinum: The heart is normal in size. No pericardial effusion. Stable advanced atherosclerotic calcifications involving the aorta and branch vessels including the coronary arteries. The esophagus is grossly normal.  Stable small mediastinal lymph nodes. The right hilar node has enlarged slightly. It measures 20 x 21 mm on image number 25.  Lungs/pleura: There is a persistent and progressive necrotic appearing right upper  lobe pneumonia. It previously measured a maximum of 7.3 x 2.8 cm and now measures 10.7 x 5.8 cm. Small abscesses are suspected. I do not see the discrete rim enhancing small abscess opposed demonstrated on the prior scan. There is also severe surrounding inflammation in the right lung with marked interstitial thickening and ground-glass opacity. I do not see a discrete mass or endobronchial lesion to suggest neoplasm.  There are numerous small bilateral pulmonary nodules which are unchanged. The nodular density at the left lung base adjacent to the major fissure in the left lower lobe measures approximately 11 mm. Recommend continued observation.  Upper abdomen:  No significant findings.  IMPRESSION: 1. Worsening necrotic appearing right upper lobe pneumonia with possible small abscesses. Surrounding pneumonitis is also progressive. 2. Slight interval enlargement of right hilar lymph nodes. 3. Stable bilateral pulmonary nodules. 4. Bronchoscopy may be helpful for further evaluation. Repeat chest CT after appropriate antibiotic therapy is recommended.   Electronically Signed   By: Marijo Sanes M.D.   On: 01/28/2015 10:29   Ct Chest W Contrast  01/22/2015   CLINICAL DATA:  Right upper lobe lung mass with productive cough and chest pain.  EXAM: CT CHEST, ABDOMEN, AND PELVIS WITH CONTRAST  TECHNIQUE: Multidetector CT imaging of the chest, abdomen and pelvis was performed following the standard protocol during bolus administration of intravenous contrast.  CONTRAST:  175mL OMNIPAQUE IOHEXOL 300 MG/ML  SOLN  COMPARISON:  Unenhanced CT of the chest earlier today at 737 hours and prior CT on 10/14/2012.  FINDINGS: CT CHEST FINDINGS  Geographic area of peripheral consolidation in the lateral and posterior right upper lobe spans over a region measuring roughly 3.7 x 7.3 x 4.4 cm. Areas of gas formation are consistent with necrosis and there is a small posterior component of focal fluid attenuation measuring roughly 1.5 cm  in diameter. Based on the contrast-enhanced CT, this may represent a necrotic pneumonia and is not necessarily representative of malignancy. The small area of liquefaction may represent early focal abscess formation. Additional ground-glass and alveolar opacity in the anterior right upper lobe and right middle lobe likely represent acute infection and extension of infiltrate. Clinical and imaging follow-up is recommended. If the process does not resolve, or  worsens on antibiotic therapy, Pulmonary Medicine evaluation is recommended.  The area of mass-like opacity measured in the anterior aspect of the lateral left lower lobe is again visualized. This is also somewhat geographic in appearance and may not represent a focal mass. Focal atelectasis and scarring is possible. Malignancy is not completely excluded. A 6-7 mm anterior right upper lobe nodule is stable since 2014. A 5 mm anterior left upper lobe nodule is stable since 2014. A 5 mm inferior lingular nodule is stable since 2014. 4 mm subpleural nodule in the right middle lobe is stable since 2014. There are 2 tiny subpleural nodules in the posterior right lower lobe. One of these located more medially was not as apparent on the 2014 study. Tiny lateral subpleural nodules in the right lower lobe appears stable since the prior study in 2014.  No airway obstruction. Mildly prominent right hilar lymph node measures approximately 11 mm in short axis. There are a few tiny additional mediastinal nodes with no enlarged lymph nodes seen. No pleural or pericardial fluid is seen. The heart size is normal. Mild spondylosis present of the thoracic spine.  CT ABDOMEN AND PELVIS FINDINGS  Solid organs show no evidence of metastatic disease. The liver, pancreas, spleen, adrenal glands and kidneys are normal. There is a small hiatal hernia. The gallbladder has been removed. No enlarged lymph nodes are seen.  Bowel is unremarkable and shows no evidence of obstruction or lesion.  No free fluid, free air or abscess is identified. Small right inguinal hernia contains fat. The bladder is unremarkable. The uterus has been removed. The abdominal aorta and iliac arteries show atherosclerotic calcifications without aneurysmal disease or significant obstructive disease identified. No bony lesions are identified.  IMPRESSION: 1. The geographic area of peripheral consolidation in the right upper lobe has an appearance most likely consistent with necrotic pneumonia with additional small early abscess formation measuring 1.5 cm within this region. Although malignancy cannot be excluded, necrotic pneumonia is favored. Additional alveolar and ground-glass infiltrate extends into the anterior right upper lobe and right middle lobe. If the process does not resolve or worsens, pulmonary Medicine evaluation is recommended. Associated 11 mm right hilar lymph node may be reactive or metastatic. 2. The peripheral lateral left lower lobe opacity may represent focal atelectasis and scarring. Malignancy cannot be excluded, however. Once the pneumonia of the right upper lobe has been adequately treated, if there is persistent opacity in the left lower lobe, PET scan evaluation may be helpful. 3. All of the subcentimeter small nodules identified bilaterally are largely peripheral/subpleural and are stable since the CT in 2014. One of the right lower lobe nodules may be new/more prominent but only measures a few mm in diameter. All of these tiny nodules are most likely postinflammatory and not representative of metastatic nodules. 4. No evidence of pathology in the abdomen or pelvis. No acute findings or evidence of metastatic disease. Incidental small hiatal hernia and small right inguinal hernia containing fat.   Electronically Signed   By: Aletta Edouard M.D.   On: 01/22/2015 13:45   Ct Abdomen Pelvis W Contrast  01/22/2015   CLINICAL DATA:  Right upper lobe lung mass with productive cough and chest pain.   EXAM: CT CHEST, ABDOMEN, AND PELVIS WITH CONTRAST  TECHNIQUE: Multidetector CT imaging of the chest, abdomen and pelvis was performed following the standard protocol during bolus administration of intravenous contrast.  CONTRAST:  120mL OMNIPAQUE IOHEXOL 300 MG/ML  SOLN  COMPARISON:  Unenhanced CT of  the chest earlier today at 737 hours and prior CT on 10/14/2012.  FINDINGS: CT CHEST FINDINGS  Geographic area of peripheral consolidation in the lateral and posterior right upper lobe spans over a region measuring roughly 3.7 x 7.3 x 4.4 cm. Areas of gas formation are consistent with necrosis and there is a small posterior component of focal fluid attenuation measuring roughly 1.5 cm in diameter. Based on the contrast-enhanced CT, this may represent a necrotic pneumonia and is not necessarily representative of malignancy. The small area of liquefaction may represent early focal abscess formation. Additional ground-glass and alveolar opacity in the anterior right upper lobe and right middle lobe likely represent acute infection and extension of infiltrate. Clinical and imaging follow-up is recommended. If the process does not resolve, or worsens on antibiotic therapy, Pulmonary Medicine evaluation is recommended.  The area of mass-like opacity measured in the anterior aspect of the lateral left lower lobe is again visualized. This is also somewhat geographic in appearance and may not represent a focal mass. Focal atelectasis and scarring is possible. Malignancy is not completely excluded. A 6-7 mm anterior right upper lobe nodule is stable since 2014. A 5 mm anterior left upper lobe nodule is stable since 2014. A 5 mm inferior lingular nodule is stable since 2014. 4 mm subpleural nodule in the right middle lobe is stable since 2014. There are 2 tiny subpleural nodules in the posterior right lower lobe. One of these located more medially was not as apparent on the 2014 study. Tiny lateral subpleural nodules in the right  lower lobe appears stable since the prior study in 2014.  No airway obstruction. Mildly prominent right hilar lymph node measures approximately 11 mm in short axis. There are a few tiny additional mediastinal nodes with no enlarged lymph nodes seen. No pleural or pericardial fluid is seen. The heart size is normal. Mild spondylosis present of the thoracic spine.  CT ABDOMEN AND PELVIS FINDINGS  Solid organs show no evidence of metastatic disease. The liver, pancreas, spleen, adrenal glands and kidneys are normal. There is a small hiatal hernia. The gallbladder has been removed. No enlarged lymph nodes are seen.  Bowel is unremarkable and shows no evidence of obstruction or lesion. No free fluid, free air or abscess is identified. Small right inguinal hernia contains fat. The bladder is unremarkable. The uterus has been removed. The abdominal aorta and iliac arteries show atherosclerotic calcifications without aneurysmal disease or significant obstructive disease identified. No bony lesions are identified.  IMPRESSION: 1. The geographic area of peripheral consolidation in the right upper lobe has an appearance most likely consistent with necrotic pneumonia with additional small early abscess formation measuring 1.5 cm within this region. Although malignancy cannot be excluded, necrotic pneumonia is favored. Additional alveolar and ground-glass infiltrate extends into the anterior right upper lobe and right middle lobe. If the process does not resolve or worsens, pulmonary Medicine evaluation is recommended. Associated 11 mm right hilar lymph node may be reactive or metastatic. 2. The peripheral lateral left lower lobe opacity may represent focal atelectasis and scarring. Malignancy cannot be excluded, however. Once the pneumonia of the right upper lobe has been adequately treated, if there is persistent opacity in the left lower lobe, PET scan evaluation may be helpful. 3. All of the subcentimeter small nodules  identified bilaterally are largely peripheral/subpleural and are stable since the CT in 2014. One of the right lower lobe nodules may be new/more prominent but only measures a few mm in diameter.  All of these tiny nodules are most likely postinflammatory and not representative of metastatic nodules. 4. No evidence of pathology in the abdomen or pelvis. No acute findings or evidence of metastatic disease. Incidental small hiatal hernia and small right inguinal hernia containing fat.   Electronically Signed   By: Aletta Edouard M.D.   On: 01/22/2015 13:45   Dg C-arm 1-60 Min-no Report  02/02/2015   CLINICAL DATA: Bronch   C-ARM 1-60 MINUTES  Fluoroscopy was utilized by the requesting physician.  No radiographic  interpretation.     Micro Results     Recent Results (from the past 240 hour(s))  Fungus Culture with Smear     Status: None (Preliminary result)   Collection Time: 02/02/15  1:30 PM  Result Value Ref Range Status   Specimen Description BRONCHIAL BRUSHING  Final   Special Requests Normal  Final   Culture NO GROWTH 2 DAYS  Final   Report Status PENDING  Incomplete  Culture, respiratory (NON-Expectorated)     Status: None (Preliminary result)   Collection Time: 02/02/15  1:30 PM  Result Value Ref Range Status   Specimen Description BRONCHIAL BRUSHING  Final   Special Requests Normal  Final   Gram Stain   Final    FAIR SPECIMEN - 70-80% WBCS FEW WBC SEEN FEW GRAM POSITIVE COCCI    Culture NORMAL FLORA ABSENT  Final   Report Status PENDING  Incomplete  C difficile quick scan w PCR reflex [facility])     Status: None   Collection Time: 02/03/15  6:02 PM  Result Value Ref Range Status   C Diff antigen NEGATIVE  Final   C Diff toxin NEGATIVE  Final   C Diff interpretation Negative for C. difficile  Final       Today   Subjective:   [name] today has no sob,wants to go home. Objective:   Blood pressure 122/68, pulse 78, temperature 98.8 F (37.1 C), temperature source  Oral, resp. rate 17, height 5\' 4"  (1.626 m), weight 57.267 kg (126 lb 4 oz), SpO2 100 %.  No intake or output data in the 24 hours ending 02/05/15 0830  Exam Awake Alert, Oriented x 3, No new F.N deficits, Normal affect [place].AT,PERRAL Supple Neck,No JVD, No cervical lymphadenopathy appriciated.  Symmetrical Chest wall movement, Good air movement bilaterally, CTAB RRR,No Gallops,Rubs or new Murmurs, No Parasternal Heave +ve B.Sounds, Abd Soft, Non tender, No organomegaly appriciated, No rebound -guarding or rigidity. No Cyanosis, Clubbing or edema, No new Rash or bruise  Data Review   CBC w Diff:  Lab Results  Component Value Date   WBC 9.9 02/04/2015   WBC 5.1 10/15/2013   HGB 8.9* 02/04/2015   HGB 12.1 10/15/2013   HCT 27.5* 02/04/2015   HCT 36.7 10/15/2013   PLT 621* 02/04/2015   PLT 153 10/15/2013   LYMPHOPCT 7 01/28/2015   LYMPHOPCT 17.4 10/15/2013   MONOPCT 4 01/28/2015   MONOPCT 7.3 10/15/2013   EOSPCT 1 01/28/2015   EOSPCT 0.1 10/15/2013   BASOPCT 0 01/28/2015   BASOPCT 0.4 10/15/2013    CMP:  Lab Results  Component Value Date   NA 137 01/24/2015   NA 131* 10/15/2013   K 3.5 01/24/2015   K 3.6 10/15/2013   CL 105 01/24/2015   CL 102 10/15/2013   CO2 24 01/24/2015   CO2 24 10/15/2013   BUN 9 01/24/2015   BUN 12 10/15/2013   CREATININE 1.00 02/01/2015   CREATININE 1.20 10/15/2013  .  Total Time in preparing paper work, data evaluation and todays exam - 72 minutes  Korion Cuevas M.D on 02/04/2015 at 8:30 AM

## 2015-02-06 LAB — CULTURE, RESPIRATORY: CULTURE: NORMAL

## 2015-02-06 LAB — CULTURE, RESPIRATORY W GRAM STAIN: Special Requests: NORMAL

## 2015-02-09 ENCOUNTER — Other Ambulatory Visit
Admission: RE | Admit: 2015-02-09 | Discharge: 2015-02-09 | Disposition: A | Discharge status: Home / Self Care | Payer: Medicare Other | Source: Ambulatory Visit | Attending: Urgent Care | Admitting: Urgent Care

## 2015-02-09 ENCOUNTER — Encounter: Payer: Self-pay | Admitting: Urgent Care

## 2015-02-09 ENCOUNTER — Ambulatory Visit (INDEPENDENT_AMBULATORY_CARE_PROVIDER_SITE_OTHER): Payer: Medicare Other | Admitting: Urgent Care

## 2015-02-09 VITALS — BP 109/69 | HR 63 | Temp 98.4°F | Ht 64.0 in | Wt 127.0 lb

## 2015-02-09 DIAGNOSIS — D649 Anemia, unspecified: Secondary | ICD-10-CM | POA: Diagnosis not present

## 2015-02-09 DIAGNOSIS — K922 Gastrointestinal hemorrhage, unspecified: Secondary | ICD-10-CM | POA: Diagnosis not present

## 2015-02-09 DIAGNOSIS — K921 Melena: Secondary | ICD-10-CM | POA: Diagnosis not present

## 2015-02-09 DIAGNOSIS — D62 Acute posthemorrhagic anemia: Secondary | ICD-10-CM | POA: Insufficient documentation

## 2015-02-09 LAB — CBC WITH DIFFERENTIAL/PLATELET
BASOS ABS: 0.1 10*3/uL (ref 0–0.1)
Basophils Relative: 1 %
EOS PCT: 2 %
Eosinophils Absolute: 0.1 10*3/uL (ref 0–0.7)
HCT: 26.7 % — ABNORMAL LOW (ref 35.0–47.0)
Hemoglobin: 8.6 g/dL — ABNORMAL LOW (ref 12.0–16.0)
LYMPHS PCT: 21 %
Lymphs Abs: 1.5 10*3/uL (ref 1.0–3.6)
MCH: 29.9 pg (ref 26.0–34.0)
MCHC: 32.2 g/dL (ref 32.0–36.0)
MCV: 92.7 fL (ref 80.0–100.0)
Monocytes Absolute: 0.4 10*3/uL (ref 0.2–0.9)
Monocytes Relative: 5 %
NEUTROS PCT: 71 %
Neutro Abs: 5.3 10*3/uL (ref 1.4–6.5)
Platelets: 594 10*3/uL — ABNORMAL HIGH (ref 150–440)
RBC: 2.87 MIL/uL — ABNORMAL LOW (ref 3.80–5.20)
RDW: 16.7 % — ABNORMAL HIGH (ref 11.5–14.5)
WBC: 7.5 10*3/uL (ref 3.6–11.0)

## 2015-02-09 NOTE — Assessment & Plan Note (Addendum)
Colonoscopy with Dr Allen Norris once cleared from a pulmonary and infectious disease standpoint. Differentials include colorectal carcinoma or polyp, AVM, inflammatory bowel disease versus benign anorectal source.  I have discussed risks & benefits which include, but are not limited to, bleeding, infection, perforation & drug reaction. The patient agrees with this plan & written consent will be obtained.  Patient advised to let us know when she is cleared from a pulmonology standpoint and we will lean towards colonoscopy in 4-6 weeks if she is cleared. She is to call me interim she develops profuse bleeding or go directly to the emergency department.

## 2015-02-09 NOTE — Progress Notes (Signed)
Primary Care Physician: Christie Nottingham., MD Primary Gastroenterologist:  Dr Allen Norris  Chief Complaint  Patient presents with  . Follow-up  . Rectal Bleeding    HPI: Analy Reilly is a 59 y.o. female here for follow up of acute onset bloody stools while hospitalized for necrotizing pneumonia. Hemoglobin remains stable despite multiple bloody stools while inpatient. She does have history of chronic hemorrhoids and has had hemorrhoidectomy.  She has had no further diarrhea and C. difficile is negative. Denies constipation, diarrhea or weight loss. She was sent home on Proctosol. She has noticed a scant amount of bright red blood mixed in her stool since discharge.  Denies NSAIDS.  Denies abdominal pain. Denies palpitations, chest pain or shortness of breath. She does have occasional dizziness if she gets up quickly.  Current Outpatient Prescriptions  Medication Sig Dispense Refill  . aspirin EC 325 MG tablet Take 325 mg by mouth daily.    Marland Kitchen atorvastatin (LIPITOR) 80 MG tablet Take 80 mg by mouth every morning.     . cetirizine (ZYRTEC) 10 MG tablet Take 10 mg by mouth daily.    . ciprofloxacin (CIPRO) 500 MG tablet Take 1 tablet (500 mg total) by mouth 2 (two) times daily. 20 tablet 0  . clindamycin (CLEOCIN) 300 MG capsule Take 1 capsule (300 mg total) by mouth every 8 (eight) hours. 30 capsule 0  . ferrous sulfate 325 (65 FE) MG tablet Take 325 mg by mouth daily.    Marland Kitchen losartan (COZAAR) 50 MG tablet Take 50 mg by mouth daily.    . metoprolol (LOPRESSOR) 50 MG tablet Take 100 mg by mouth daily.    . feeding supplement, ENSURE ENLIVE, (ENSURE ENLIVE) LIQD Take 237 mLs by mouth 3 (three) times daily with meals. (Patient not taking: Reported on 02/09/2015) 237 mL 12  . hydrocortisone (PROCTOSOL HC) 2.5 % rectal cream Place 1 application rectally 2 (two) times daily. (Patient not taking: Reported on 02/09/2015) 30 g 0   No current facility-administered medications for this visit.    Allergies as of  02/09/2015 - Review Complete 02/09/2015  Allergen Reaction Noted  . Aspirin Nausea And Vomiting and Other (See Comments) 01/22/2015    Review of Systems: Gen: Denies any fever, chills, fatigue, weakness, malaise ENT: Negative for hoarseness, difficulty swallowing , nasal congestion CV: Denies chest pain, angina, palpitations, syncope, orthopnea, PND, peripheral edema, and claudication. Resp: Denies dyspnea at rest, dyspnea with exercise, cough, sputum, wheezing, coughing up blood, and pleurisy. GI: See HPI GU:  Negative for dysuria, hematuria, urinary incontinence, urinary frequency, nocturnal urination.  Endo: Negative for unusual weight change or sweats Derm: Denies jaundice, rash, itching, or unhealing ulcers.  Psych: Denies depression, anxiety, memory loss, suicidal ideation, hallucinations, paranoia, and confusion. Heme: Denies bruising, and enlarged lymph nodes.   Physical Examination:  BP 109/69 mmHg  Pulse 63  Temp(Src) 98.4 F (36.9 C) (Oral)  Ht 5\' 4"  (1.626 m)  Wt 127 lb (57.607 kg)  BMI 21.79 kg/m2 Body mass index is 21.79 kg/(m^2). No LMP recorded. Patient has had a hysterectomy. General:   Alert,  Well-developed, well-nourished, pleasant and cooperative in NAD Head:  Normocephalic and atraumatic. Eyes:  Sclera clear, no icterus.   Conjunctiva pink. Mouth:  No deformity or lesions.  Oropharynx pink & moist. Neck:  Supple; no masses or thyromegaly. Heart:  Regular rate and rhythm; no murmurs, clicks, rubs,  or gallops. Abdomen:   Normal bowel sounds.  Soft, nontender and nondistended. No masses, hepatosplenomegaly or  hernias noted. No guarding or rebound tenderness.   Msk:  Symmetrical without gross deformities. Normal posture. Pulses:  Normal pulses noted. Extremities:  Without clubbing or edema. Neurologic:  Alert and  oriented x3;  grossly normal neurologically. Skin:  Intact without significant lesions or rashes. Cervical Nodes:  No significant cervical  adenopathy. Psych:  Alert and cooperative. Normal mood and affect.

## 2015-02-09 NOTE — Assessment & Plan Note (Signed)
Continue iron Recheck hemoglobin

## 2015-02-09 NOTE — Patient Instructions (Signed)
To ER if you have severe bleeding Please get your labs as soon as possible.  We will send a letter with results. You will need a colonoscopy with Dr. Allen Norris in the near future at [facility]. I would like for you to get clearance from your lung doctor as well as the doctor that is managing your pneumonia Dr. Ola Spurr so we are probably looking at 4-6 weeks or more. Please call us when you're ready to schedule your colonoscopy.

## 2015-02-10 ENCOUNTER — Other Ambulatory Visit: Payer: Self-pay | Admitting: Physician Assistant

## 2015-02-10 ENCOUNTER — Ambulatory Visit
Admission: RE | Admit: 2015-02-10 | Discharge: 2015-02-10 | Disposition: A | Discharge status: Home / Self Care | Payer: Medicare Other | Source: Ambulatory Visit | Attending: Physician Assistant | Admitting: Physician Assistant

## 2015-02-10 DIAGNOSIS — J851 Abscess of lung with pneumonia: Secondary | ICD-10-CM | POA: Diagnosis not present

## 2015-02-10 DIAGNOSIS — J189 Pneumonia, unspecified organism: Secondary | ICD-10-CM | POA: Diagnosis not present

## 2015-02-10 DIAGNOSIS — R05 Cough: Secondary | ICD-10-CM

## 2015-02-10 DIAGNOSIS — J449 Chronic obstructive pulmonary disease, unspecified: Secondary | ICD-10-CM | POA: Diagnosis not present

## 2015-02-10 DIAGNOSIS — F17210 Nicotine dependence, cigarettes, uncomplicated: Secondary | ICD-10-CM | POA: Diagnosis not present

## 2015-02-10 DIAGNOSIS — R059 Cough, unspecified: Secondary | ICD-10-CM

## 2015-02-11 NOTE — Addendum Note (Signed)
Addended by: ELLISON, SAMAR R on: 02/11/2015 08:39 AM ° ° Modules accepted: Orders ° °

## 2015-02-17 DIAGNOSIS — F17210 Nicotine dependence, cigarettes, uncomplicated: Secondary | ICD-10-CM | POA: Diagnosis not present

## 2015-02-17 DIAGNOSIS — J158 Pneumonia due to other specified bacteria: Secondary | ICD-10-CM | POA: Diagnosis not present

## 2015-02-17 DIAGNOSIS — J439 Emphysema, unspecified: Secondary | ICD-10-CM | POA: Diagnosis not present

## 2015-02-17 DIAGNOSIS — B371 Pulmonary candidiasis: Secondary | ICD-10-CM | POA: Diagnosis not present

## 2015-02-23 LAB — FUNGUS CULTURE W SMEAR: Special Requests: NORMAL

## 2015-02-24 ENCOUNTER — Telehealth: Payer: Self-pay | Admitting: Urgent Care

## 2015-02-24 NOTE — Telephone Encounter (Signed)
Patient called today to say she had been in the hospital and was consulted by Vickey Huger. She has been released and Kandice told her to call to schedule a colonoscopy once she was released. Please call for colonoscopy screening.

## 2015-02-25 ENCOUNTER — Other Ambulatory Visit: Payer: Self-pay

## 2015-02-25 NOTE — Telephone Encounter (Signed)
Gastroenterology Pre-Procedure Review  Request Date: 04-12-15 Requesting Physician: Dr. Leta Baptist  PATIENT REVIEW QUESTIONS: The patient responded to the following health history questions as indicated:    1. Are you having any GI issues? no 2. Do you have a personal history of Polyps? yes (2 removed) 3. Do you have a family history of Colon Cancer or Polyps? no 4. Diabetes Mellitus? no 5. Joint replacements in the past 12 months?no 6. Major health problems in the past 3 months?no 7. Any artificial heart valves, MVP, or defibrillator?no    MEDICATIONS & ALLERGIES:    Patient reports the following regarding taking any anticoagulation/antiplatelet therapy:   Plavix, Coumadin, Eliquis, Xarelto, Lovenox, Pradaxa, Brilinta, or Effient? no Aspirin? yes (325mg )  Patient confirms/reports the following medications:  Current Outpatient Prescriptions  Medication Sig Dispense Refill  . aspirin EC 325 MG tablet Take 325 mg by mouth daily.    Marland Kitchen atorvastatin (LIPITOR) 80 MG tablet Take 80 mg by mouth every morning.     . ferrous sulfate 325 (65 FE) MG tablet Take 325 mg by mouth daily.    Marland Kitchen losartan (COZAAR) 50 MG tablet Take 50 mg by mouth daily.    . metoprolol (LOPRESSOR) 50 MG tablet Take 100 mg by mouth daily.    . cetirizine (ZYRTEC) 10 MG tablet Take 10 mg by mouth daily.    . ciprofloxacin (CIPRO) 500 MG tablet Take 1 tablet (500 mg total) by mouth 2 (two) times daily. (Patient not taking: Reported on 02/25/2015) 20 tablet 0  . clindamycin (CLEOCIN) 300 MG capsule Take 1 capsule (300 mg total) by mouth every 8 (eight) hours. (Patient not taking: Reported on 02/25/2015) 30 capsule 0  . feeding supplement, ENSURE ENLIVE, (ENSURE ENLIVE) LIQD Take 237 mLs by mouth 3 (three) times daily with meals. (Patient not taking: Reported on 02/09/2015) 237 mL 12  . hydrocortisone (PROCTOSOL HC) 2.5 % rectal cream Place 1 application rectally 2 (two) times daily. (Patient not taking: Reported on 02/09/2015) 30 g  0   No current facility-administered medications for this visit.    Patient confirms/reports the following allergies:  Allergies  Allergen Reactions  . Aspirin Nausea And Vomiting and Other (See Comments)    Pt states aspirin makes her cramp and have to use coated kind.    No orders of the defined types were placed in this encounter.    AUTHORIZATION INFORMATION Primary Insurance: 1D#: Group #:  Secondary Insurance: 1D#: Group #:  SCHEDULE INFORMATION: Date: 04-12-15 Time: Location: [place]

## 2015-02-25 NOTE — Telephone Encounter (Signed)
LVM for pt to return my call.

## 2015-03-09 ENCOUNTER — Ambulatory Visit
Admission: RE | Admit: 2015-03-09 | Discharge: 2015-03-09 | Disposition: A | Discharge status: Home / Self Care | Payer: Medicare Other | Source: Ambulatory Visit | Attending: Internal Medicine | Admitting: Internal Medicine

## 2015-03-09 ENCOUNTER — Other Ambulatory Visit: Payer: Self-pay | Admitting: Internal Medicine

## 2015-03-09 DIAGNOSIS — J181 Lobar pneumonia, unspecified organism: Secondary | ICD-10-CM | POA: Diagnosis not present

## 2015-03-09 DIAGNOSIS — J189 Pneumonia, unspecified organism: Secondary | ICD-10-CM

## 2015-03-09 DIAGNOSIS — Z8701 Personal history of pneumonia (recurrent): Secondary | ICD-10-CM | POA: Insufficient documentation

## 2015-03-09 DIAGNOSIS — R0602 Shortness of breath: Secondary | ICD-10-CM | POA: Diagnosis not present

## 2015-03-09 DIAGNOSIS — Z09 Encounter for follow-up examination after completed treatment for conditions other than malignant neoplasm: Secondary | ICD-10-CM | POA: Diagnosis not present

## 2015-03-09 LAB — ACID FAST SMEAR+CULTURE W/RFLX (ARMC ONLY)
Acid Fast Culture: NEGATIVE
Acid Fast Smear: NEGATIVE

## 2015-03-10 LAB — ACID FAST SMEAR+CULTURE W/RFLX (ARMC ONLY)
Acid Fast Culture: NEGATIVE
Acid Fast Smear: NEGATIVE

## 2015-03-18 LAB — ACID FAST SMEAR+CULTURE W/RFLX (ARMC ONLY)
ACID FAST CULTURE: NEGATIVE
Acid Fast Smear: NEGATIVE

## 2015-03-24 ENCOUNTER — Other Ambulatory Visit: Payer: Self-pay | Admitting: Internal Medicine

## 2015-03-24 DIAGNOSIS — J189 Pneumonia, unspecified organism: Secondary | ICD-10-CM | POA: Diagnosis not present

## 2015-03-24 DIAGNOSIS — F17210 Nicotine dependence, cigarettes, uncomplicated: Secondary | ICD-10-CM | POA: Diagnosis not present

## 2015-03-24 DIAGNOSIS — J449 Chronic obstructive pulmonary disease, unspecified: Secondary | ICD-10-CM | POA: Diagnosis not present

## 2015-03-24 DIAGNOSIS — R911 Solitary pulmonary nodule: Secondary | ICD-10-CM

## 2015-03-29 ENCOUNTER — Ambulatory Visit: Admission: RE | Admit: 2015-03-29 | Payer: Medicare Other | Source: Ambulatory Visit

## 2015-04-06 ENCOUNTER — Ambulatory Visit
Admission: RE | Admit: 2015-04-06 | Discharge: 2015-04-06 | Disposition: A | Discharge status: Home / Self Care | Payer: Medicare Other | Source: Ambulatory Visit | Attending: Internal Medicine | Admitting: Internal Medicine

## 2015-04-06 DIAGNOSIS — J189 Pneumonia, unspecified organism: Secondary | ICD-10-CM | POA: Diagnosis not present

## 2015-04-06 DIAGNOSIS — J449 Chronic obstructive pulmonary disease, unspecified: Secondary | ICD-10-CM | POA: Insufficient documentation

## 2015-04-06 DIAGNOSIS — R918 Other nonspecific abnormal finding of lung field: Secondary | ICD-10-CM | POA: Insufficient documentation

## 2015-04-06 DIAGNOSIS — J850 Gangrene and necrosis of lung: Secondary | ICD-10-CM | POA: Diagnosis not present

## 2015-04-06 DIAGNOSIS — J439 Emphysema, unspecified: Secondary | ICD-10-CM | POA: Diagnosis not present

## 2015-04-06 DIAGNOSIS — R911 Solitary pulmonary nodule: Secondary | ICD-10-CM | POA: Diagnosis present

## 2015-04-06 MED ORDER — IOHEXOL 300 MG/ML  SOLN
75.0000 mL | Freq: Once | INTRAMUSCULAR | Status: AC | PRN
  Administered 2015-04-06: 75 mL via INTRAVENOUS

## 2015-04-07 DIAGNOSIS — J449 Chronic obstructive pulmonary disease, unspecified: Secondary | ICD-10-CM | POA: Diagnosis not present

## 2015-04-07 DIAGNOSIS — R911 Solitary pulmonary nodule: Secondary | ICD-10-CM | POA: Diagnosis not present

## 2015-04-07 DIAGNOSIS — F17210 Nicotine dependence, cigarettes, uncomplicated: Secondary | ICD-10-CM | POA: Diagnosis not present

## 2015-04-07 DIAGNOSIS — J189 Pneumonia, unspecified organism: Secondary | ICD-10-CM | POA: Diagnosis not present

## 2015-04-11 DIAGNOSIS — K648 Other hemorrhoids: Secondary | ICD-10-CM | POA: Insufficient documentation

## 2015-04-11 DIAGNOSIS — I10 Essential (primary) hypertension: Secondary | ICD-10-CM | POA: Insufficient documentation

## 2015-04-11 DIAGNOSIS — I519 Heart disease, unspecified: Secondary | ICD-10-CM | POA: Insufficient documentation

## 2015-04-11 DIAGNOSIS — Z87898 Personal history of other specified conditions: Secondary | ICD-10-CM | POA: Insufficient documentation

## 2015-04-11 DIAGNOSIS — Z860100 Personal history of colon polyps, unspecified: Secondary | ICD-10-CM | POA: Insufficient documentation

## 2015-04-11 DIAGNOSIS — I252 Old myocardial infarction: Secondary | ICD-10-CM | POA: Insufficient documentation

## 2015-04-11 DIAGNOSIS — Z8639 Personal history of other endocrine, nutritional and metabolic disease: Secondary | ICD-10-CM | POA: Insufficient documentation

## 2015-04-12 ENCOUNTER — Ambulatory Visit: Payer: Medicare Other | Admitting: Anesthesiology

## 2015-04-12 ENCOUNTER — Ambulatory Visit
Admission: RE | Admit: 2015-04-12 | Discharge: 2015-04-12 | Disposition: A | Discharge status: Home / Self Care | Payer: Medicare Other | Source: Ambulatory Visit | Attending: Physician Assistant | Admitting: Physician Assistant

## 2015-04-12 ENCOUNTER — Encounter: Payer: Self-pay | Admitting: *Deleted

## 2015-04-12 ENCOUNTER — Encounter
Admission: RE | Disposition: A | Discharge status: Home / Self Care | Payer: Self-pay | Source: Ambulatory Visit | Attending: Gastroenterology

## 2015-04-12 DIAGNOSIS — I10 Essential (primary) hypertension: Secondary | ICD-10-CM | POA: Diagnosis not present

## 2015-04-12 DIAGNOSIS — I739 Peripheral vascular disease, unspecified: Secondary | ICD-10-CM | POA: Insufficient documentation

## 2015-04-12 DIAGNOSIS — Z7982 Long term (current) use of aspirin: Secondary | ICD-10-CM | POA: Insufficient documentation

## 2015-04-12 DIAGNOSIS — D127 Benign neoplasm of rectosigmoid junction: Secondary | ICD-10-CM | POA: Insufficient documentation

## 2015-04-12 DIAGNOSIS — I252 Old myocardial infarction: Secondary | ICD-10-CM | POA: Insufficient documentation

## 2015-04-12 DIAGNOSIS — K921 Melena: Secondary | ICD-10-CM | POA: Insufficient documentation

## 2015-04-12 DIAGNOSIS — J45909 Unspecified asthma, uncomplicated: Secondary | ICD-10-CM | POA: Insufficient documentation

## 2015-04-12 DIAGNOSIS — Z79899 Other long term (current) drug therapy: Secondary | ICD-10-CM | POA: Diagnosis not present

## 2015-04-12 DIAGNOSIS — Z886 Allergy status to analgesic agent status: Secondary | ICD-10-CM | POA: Insufficient documentation

## 2015-04-12 DIAGNOSIS — M1990 Unspecified osteoarthritis, unspecified site: Secondary | ICD-10-CM | POA: Diagnosis not present

## 2015-04-12 DIAGNOSIS — K648 Other hemorrhoids: Secondary | ICD-10-CM | POA: Diagnosis not present

## 2015-04-12 DIAGNOSIS — Z9889 Other specified postprocedural states: Secondary | ICD-10-CM | POA: Diagnosis not present

## 2015-04-12 DIAGNOSIS — K641 Second degree hemorrhoids: Secondary | ICD-10-CM | POA: Insufficient documentation

## 2015-04-12 DIAGNOSIS — Z8249 Family history of ischemic heart disease and other diseases of the circulatory system: Secondary | ICD-10-CM | POA: Diagnosis not present

## 2015-04-12 DIAGNOSIS — F17210 Nicotine dependence, cigarettes, uncomplicated: Secondary | ICD-10-CM | POA: Diagnosis not present

## 2015-04-12 DIAGNOSIS — J449 Chronic obstructive pulmonary disease, unspecified: Secondary | ICD-10-CM | POA: Diagnosis not present

## 2015-04-12 DIAGNOSIS — Z86010 Personal history of colonic polyps: Secondary | ICD-10-CM | POA: Diagnosis not present

## 2015-04-12 HISTORY — DX: Unspecified osteoarthritis, unspecified site: M19.90

## 2015-04-12 HISTORY — PX: COLONOSCOPY WITH PROPOFOL: SHX5780

## 2015-04-12 SURGERY — COLONOSCOPY WITH PROPOFOL
Anesthesia: General

## 2015-04-12 MED ORDER — FENTANYL CITRATE (PF) 100 MCG/2ML IJ SOLN
INTRAMUSCULAR | Status: DC | PRN
  Administered 2015-04-12: 50 ug via INTRAVENOUS

## 2015-04-12 MED ORDER — PROPOFOL INFUSION 10 MG/ML OPTIME
INTRAVENOUS | Status: DC | PRN
  Administered 2015-04-12: 120 ug/kg/min via INTRAVENOUS

## 2015-04-12 MED ORDER — SODIUM CHLORIDE 0.9 % IV SOLN
INTRAVENOUS | Status: DC
  Administered 2015-04-12: 10:00:00 via INTRAVENOUS

## 2015-04-12 MED ORDER — MIDAZOLAM HCL 2 MG/2ML IJ SOLN
INTRAMUSCULAR | Status: DC | PRN
  Administered 2015-04-12: 1 mg via INTRAVENOUS

## 2015-04-12 NOTE — Anesthesia Postprocedure Evaluation (Signed)
  Anesthesia Post-op Note  Patient: [name]  Procedure(s) Performed: Procedure(s): COLONOSCOPY WITH PROPOFOL (N/A)  Anesthesia type:General  Patient location: PACU  Post pain: Pain level controlled  Post assessment: Post-op Vital signs reviewed, Patient's Cardiovascular Status Stable, Respiratory Function Stable, Patent Airway and No signs of Nausea or vomiting  Post vital signs: Reviewed and stable  Last Vitals:  Filed Vitals:   04/12/15 1150  BP: 165/77  Pulse: 59  Temp:   Resp: 16    Level of consciousness: awake, alert  and patient cooperative  Complications: No apparent anesthesia complications

## 2015-04-12 NOTE — Op Note (Signed)
[facility] Gastroenterology Patient Name: [name] Procedure Date: 04/12/2015 10:47 AM MRN: [id number] Account #: [id number] Date of Birth: July 30, 1956 Admit Type: Outpatient Age: 59 Room: [facility] ENDO ROOM 4 Gender: Female Note Status: Finalized Procedure:         Colonoscopy Indications:       Hematochezia Providers:         Lucilla Lame, MD Referring MD:      Leighton Ruff. Turner (Referring MD) Medicines:         Propofol per Anesthesia Complications:     No immediate complications. Procedure:         Pre-Anesthesia Assessment:                    - Prior to the procedure, a History and Physical was                     performed, and patient medications and allergies were                     reviewed. The patient's tolerance of previous anesthesia                     was also reviewed. The risks and benefits of the procedure                     and the sedation options and risks were discussed with the                     patient. All questions were answered, and informed consent                     was obtained. Prior Anticoagulants: The patient has taken                     no previous anticoagulant or antiplatelet agents. ASA                     Grade Assessment: II - A patient with mild systemic                     disease. After reviewing the risks and benefits, the                     patient was deemed in satisfactory condition to undergo                     the procedure.                    After obtaining informed consent, the colonoscope was                     passed under direct vision. Throughout the procedure, the                     patient's blood pressure, pulse, and oxygen saturations                     were monitored continuously. The Colonoscope was                     introduced through the anus and advanced to the the cecum,  identified by appendiceal orifice and ileocecal valve. The                     colonoscopy was  performed without difficulty. The patient                     tolerated the procedure well. The quality of the bowel                     preparation was excellent. Findings:      The perianal and digital rectal examinations were normal.      Three sessile polyps were found in the recto-sigmoid colon. The polyps       were 2 to 3 mm in size. These polyps were removed with a cold biopsy       forceps. Resection and retrieval were complete.      Non-bleeding internal hemorrhoids were found during retroflexion. The       hemorrhoids were Grade II (internal hemorrhoids that prolapse but reduce       spontaneously). Impression:        - Three 2 to 3 mm polyps at the recto-sigmoid colon.                     Resected and retrieved.                    - Non-bleeding internal hemorrhoids. Recommendation:    - Await pathology results.                    - Repeat colonoscopy in 5 years if polyp adenoma and 10                     years if hyperplastic Procedure Code(s): --- Professional ---                    [phone], Colonoscopy, flexible; with biopsy, single or                     multiple Diagnosis Code(s): --- Professional ---                    K92.1, Melena                    D12.7, Benign neoplasm of rectosigmoid junction CPT copyright 2014 American Medical Association. All rights reserved. The codes documented in this report are preliminary and upon coder review may  be revised to meet current compliance requirements. Lucilla Lame, MD 04/12/2015 11:13:15 AM This report has been signed electronically. Number of Addenda: 0 Note Initiated On: 04/12/2015 10:47 AM Scope Withdrawal Time: 0 hours 9 minutes 3 seconds  Total Procedure Duration: 0 hours 11 minutes 32 seconds       [facility]

## 2015-04-12 NOTE — Anesthesia Procedure Notes (Signed)
Performed by: COOK-MARTIN, Xabi Wittler Pre-anesthesia Checklist: Patient identified, Emergency Drugs available, Suction available, Patient being monitored and Timeout performed Patient Re-evaluated:Patient Re-evaluated prior to inductionOxygen Delivery Method: Nasal cannula Preoxygenation: Pre-oxygenation with 100% oxygen Intubation Type: IV induction Placement Confirmation: CO2 detector and positive ETCO2       

## 2015-04-12 NOTE — Transfer of Care (Signed)
Immediate Anesthesia Transfer of Care Note  Patient: [name]  Procedure(s) Performed: Procedure(s): COLONOSCOPY WITH PROPOFOL (N/A)  Patient Location: PACU  Anesthesia Type:General  Level of Consciousness: awake and sedated  Airway & Oxygen Therapy: Patient Spontanous Breathing and Patient connected to nasal cannula oxygen  Post-op Assessment: Report given to RN and Post -op Vital signs reviewed and stable  Post vital signs: Reviewed and stable  Last Vitals:  Filed Vitals:   04/12/15 1003  BP: 159/95  Pulse: 75  Temp: 35.8 C  Resp: 17    Complications: No apparent anesthesia complications

## 2015-04-12 NOTE — Anesthesia Preprocedure Evaluation (Addendum)
Anesthesia Evaluation  Patient identified by MRN, date of birth, ID band Patient awake    Reviewed: Allergy & Precautions, H&P , NPO status , Patient's Chart, lab work & pertinent test results  History of Anesthesia Complications Negative for: history of anesthetic complications  Airway Mallampati: II  TM Distance: >3 FB Neck ROM: limited    Dental  (+) Poor Dentition, Chipped, Missing   Pulmonary neg pulmonary ROS, asthma , pneumonia -, resolved, COPDCurrent Smoker,  breath sounds clear to auscultation  Pulmonary exam normal       Cardiovascular Exercise Tolerance: Good hypertension, + CAD, + Past MI and + Peripheral Vascular Disease - Cardiac Stents and - CABG Normal cardiovascular examRhythm:regular Rate:Normal     Neuro/Psych negative neurological ROS  negative psych ROS   GI/Hepatic negative GI ROS, Neg liver ROS,   Endo/Other  negative endocrine ROS  Renal/GU negative Renal ROS  negative genitourinary   Musculoskeletal  (+) Arthritis -,   Abdominal   Peds  Hematology negative hematology ROS (+)   Anesthesia Other Findings Past Medical History:   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Comment:negative cardiac cath   COPD (chronic obstructive pulmonary disease)                 Hypertension                                                 Peripheral vascular disease                                  Nicotine addiction                                           Asthma                                                       Hemorrhoids                                                  Arthritis                                                    Patient reports cardiac clearance for this procedure  Reproductive/Obstetrics negative OB ROS                            Anesthesia Physical Anesthesia Plan  ASA: III  Anesthesia Plan: General   Post-op Pain Management:  Induction:   Airway Management Planned:   Additional Equipment:   Intra-op Plan:   Post-operative Plan:   Informed Consent: I have reviewed the patients History and Physical, chart, labs and discussed the procedure including the risks, benefits and alternatives for the proposed anesthesia with the patient or authorized representative who has indicated his/her understanding and acceptance.   Dental Advisory Given  Plan Discussed with: Anesthesiologist, CRNA and Surgeon  Anesthesia Plan Comments:         Anesthesia Quick Evaluation

## 2015-04-12 NOTE — H&P (Signed)
[facility]  [address] [place], [place] [address] Phone: [phone] Fax : [phone]  Primary Care Physician:  Christie Nottingham., PA Primary Gastroenterologist:  Dr. Allen Norris  Pre-Procedure History & Physical: HPI:  [name] is a 59 y.o. female is here for an colonoscopy.   Past Medical History  Diagnosis Date  . 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negative cardiac cath  . COPD (chronic obstructive pulmonary disease)   . Hypertension   . Peripheral vascular disease   . Nicotine addiction   . Asthma   . Hemorrhoids   . Arthritis     Past Surgical History  Procedure Laterality Date  . Endovascular stent insertion Bilateral     Legs  . Cardiac surgery    . Hemorrhoid surgery    . Colonoscopy  12/15/11    OH->bleeding internal hemorrhoids, otherwise normal    Prior to Admission medications   Medication Sig Start Date End Date Taking? Authorizing Provider  aspirin EC 325 MG tablet Take 325 mg by mouth daily.    Historical Provider, MD  atorvastatin (LIPITOR) 80 MG tablet Take 80 mg by mouth every morning.  11/15/14   Historical Provider, MD  cetirizine (ZYRTEC) 10 MG tablet Take 10 mg by mouth daily.    Historical Provider, MD  ciprofloxacin (CIPRO) 500 MG tablet Take 1 tablet (500 mg total) by mouth 2 (two) times daily. Patient not taking: Reported on 02/25/2015 02/03/15   Epifanio Lesches, MD  clindamycin (CLEOCIN) 300 MG capsule Take 1 capsule (300 mg total) by mouth every 8 (eight) hours. Patient not taking: Reported on 02/25/2015 02/03/15   Epifanio Lesches, MD  feeding supplement, ENSURE ENLIVE, (ENSURE ENLIVE) LIQD Take 237 mLs by mouth 3 (three) times daily with meals. Patient not taking: Reported on 02/09/2015 02/03/15   Epifanio Lesches, MD  ferrous sulfate 325 (65 FE) MG tablet Take 325 mg by mouth daily.    Historical Provider, MD  hydrocortisone (PROCTOSOL HC) 2.5 % rectal cream Place 1 application rectally 2 (two) times daily. Patient not taking:  Reported on 02/09/2015 02/04/15   Epifanio Lesches, MD  losartan (COZAAR) 50 MG tablet Take 50 mg by mouth daily. 12/09/14   Historical Provider, MD  metoprolol (LOPRESSOR) 50 MG tablet Take 100 mg by mouth daily. 01/19/15   Historical Provider, MD    Allergies as of 02/25/2015 - Review Complete 02/25/2015  Allergen Reaction Noted  . Aspirin Nausea And Vomiting and Other (See Comments) 01/22/2015    Family History  Problem Relation Age of Onset  . Hypertension Mother   . Hypertension Father     History   Social History  . Marital Status: Single    Spouse Name: N/A  . Number of Children: N/A  . Years of Education: N/A   Occupational History  . Not on file.   Social History Main Topics  . Smoking status: Current Every Day Smoker -- 0.50 packs/day    Types: Cigarettes  . Smokeless tobacco: Never Used     Comment: pt recommend to stop smoking 4 min spent will start on prn nicotine replacment  . Alcohol Use: 0.0 oz/week    0 Standard drinks or equivalent per week     Comment: weekends  . Drug Use: No  . Sexual Activity: Not on file   Other Topics Concern  . Not on file   Social History Narrative    Review of Systems: See HPI, otherwise negative ROS  Physical Exam: BP 165/77 mmHg  Pulse 59  Temp(Src) 96.6 F (35.9 C) (Oral)  Resp 16  Ht 5\' 4"  (1.626 m)  Wt 126 lb (57.153 kg)  BMI 21.62 kg/m2  SpO2 100% General:   Alert,  pleasant and cooperative in NAD Head:  Normocephalic and atraumatic. Neck:  Supple; no masses or thyromegaly. Lungs:  Clear throughout to auscultation.    Heart:  Regular rate and rhythm. Abdomen:  Soft, nontender and nondistended. Normal bowel sounds, without guarding, and without rebound.   Neurologic:  Alert and  oriented x4;  grossly normal neurologically.  Impression/Plan: [name] is here for an colonoscopy to be performed for hematochezia  Risks, benefits, limitations, and alternatives regarding  colonoscopy have been reviewed  with the patient.  Questions have been answered.  All parties agreeable.   Ollen Bowl, MD  04/12/2015, 12:56 PM

## 2015-04-13 LAB — SURGICAL PATHOLOGY

## 2015-04-14 DIAGNOSIS — I739 Peripheral vascular disease, unspecified: Secondary | ICD-10-CM | POA: Diagnosis not present

## 2015-04-14 DIAGNOSIS — I10 Essential (primary) hypertension: Secondary | ICD-10-CM | POA: Diagnosis not present

## 2015-04-14 DIAGNOSIS — I340 Nonrheumatic mitral (valve) insufficiency: Secondary | ICD-10-CM | POA: Diagnosis not present

## 2015-04-14 DIAGNOSIS — I2510 Atherosclerotic heart disease of native coronary artery without angina pectoris: Secondary | ICD-10-CM | POA: Diagnosis not present

## 2015-04-15 ENCOUNTER — Encounter: Payer: Self-pay | Admitting: Gastroenterology

## 2015-04-19 DIAGNOSIS — I6523 Occlusion and stenosis of bilateral carotid arteries: Secondary | ICD-10-CM | POA: Diagnosis not present

## 2015-04-19 DIAGNOSIS — I6529 Occlusion and stenosis of unspecified carotid artery: Secondary | ICD-10-CM | POA: Diagnosis not present

## 2015-04-19 DIAGNOSIS — I70211 Atherosclerosis of native arteries of extremities with intermittent claudication, right leg: Secondary | ICD-10-CM | POA: Diagnosis not present

## 2015-04-19 DIAGNOSIS — I70222 Atherosclerosis of native arteries of extremities with rest pain, left leg: Secondary | ICD-10-CM | POA: Diagnosis not present

## 2015-04-19 DIAGNOSIS — I10 Essential (primary) hypertension: Secondary | ICD-10-CM | POA: Diagnosis not present

## 2015-04-20 DIAGNOSIS — I739 Peripheral vascular disease, unspecified: Secondary | ICD-10-CM | POA: Diagnosis not present

## 2015-04-25 ENCOUNTER — Encounter
Admission: AD | Disposition: A | Discharge status: Home Health | Payer: Self-pay | Source: Ambulatory Visit | Attending: Vascular Surgery

## 2015-04-25 ENCOUNTER — Encounter: Payer: Self-pay | Admitting: *Deleted

## 2015-04-25 ENCOUNTER — Encounter: Payer: Self-pay | Admitting: Gastroenterology

## 2015-04-25 ENCOUNTER — Inpatient Hospital Stay
Admission: AD | Admit: 2015-04-25 | Discharge: 2015-04-29 | DRG: 253 | Disposition: A | Discharge status: Home Health | Payer: Medicare Other | Source: Ambulatory Visit | Attending: Vascular Surgery | Admitting: Vascular Surgery

## 2015-04-25 DIAGNOSIS — I70222 Atherosclerosis of native arteries of extremities with rest pain, left leg: Secondary | ICD-10-CM | POA: Diagnosis not present

## 2015-04-25 DIAGNOSIS — I75022 Atheroembolism of left lower extremity: Secondary | ICD-10-CM | POA: Diagnosis present

## 2015-04-25 DIAGNOSIS — I739 Peripheral vascular disease, unspecified: Secondary | ICD-10-CM | POA: Diagnosis not present

## 2015-04-25 DIAGNOSIS — M1990 Unspecified osteoarthritis, unspecified site: Secondary | ICD-10-CM | POA: Diagnosis present

## 2015-04-25 DIAGNOSIS — J45909 Unspecified asthma, uncomplicated: Secondary | ICD-10-CM | POA: Diagnosis present

## 2015-04-25 DIAGNOSIS — F17210 Nicotine dependence, cigarettes, uncomplicated: Secondary | ICD-10-CM | POA: Diagnosis present

## 2015-04-25 DIAGNOSIS — I6523 Occlusion and stenosis of bilateral carotid arteries: Secondary | ICD-10-CM | POA: Diagnosis not present

## 2015-04-25 DIAGNOSIS — I701 Atherosclerosis of renal artery: Secondary | ICD-10-CM | POA: Diagnosis not present

## 2015-04-25 DIAGNOSIS — Z79899 Other long term (current) drug therapy: Secondary | ICD-10-CM

## 2015-04-25 DIAGNOSIS — I2510 Atherosclerotic heart disease of native coronary artery without angina pectoris: Secondary | ICD-10-CM | POA: Diagnosis present

## 2015-04-25 DIAGNOSIS — I998 Other disorder of circulatory system: Secondary | ICD-10-CM | POA: Diagnosis present

## 2015-04-25 DIAGNOSIS — I10 Essential (primary) hypertension: Secondary | ICD-10-CM | POA: Diagnosis present

## 2015-04-25 DIAGNOSIS — I252 Old myocardial infarction: Secondary | ICD-10-CM | POA: Diagnosis not present

## 2015-04-25 DIAGNOSIS — Z8249 Family history of ischemic heart disease and other diseases of the circulatory system: Secondary | ICD-10-CM | POA: Diagnosis not present

## 2015-04-25 DIAGNOSIS — I70212 Atherosclerosis of native arteries of extremities with intermittent claudication, left leg: Secondary | ICD-10-CM | POA: Diagnosis not present

## 2015-04-25 DIAGNOSIS — J449 Chronic obstructive pulmonary disease, unspecified: Secondary | ICD-10-CM | POA: Diagnosis not present

## 2015-04-25 HISTORY — DX: Atherosclerotic heart disease of native coronary artery without angina pectoris: I25.10

## 2015-04-25 HISTORY — PX: PERIPHERAL VASCULAR CATHETERIZATION: SHX172C

## 2015-04-25 LAB — BASIC METABOLIC PANEL
ANION GAP: 6 (ref 5–15)
BUN: 24 mg/dL — ABNORMAL HIGH (ref 6–20)
CO2: 21 mmol/L — ABNORMAL LOW (ref 22–32)
Calcium: 8.9 mg/dL (ref 8.9–10.3)
Chloride: 110 mmol/L (ref 101–111)
Creatinine, Ser: 1 mg/dL (ref 0.44–1.00)
GFR calc Af Amer: >60 mL/min (ref >60)
GFR calc non Af Amer: >60 mL/min (ref >60)
GLUCOSE: 120 mg/dL — AB (ref 65–99)
POTASSIUM: 3.9 mmol/L (ref 3.5–5.1)
SODIUM: 137 mmol/L (ref 135–145)

## 2015-04-25 LAB — CBC
HEMATOCRIT: 36.6 % (ref 35.0–47.0)
HEMOGLOBIN: 12.3 g/dL (ref 12.0–16.0)
MCH: 31.4 pg (ref 26.0–34.0)
MCHC: 33.5 g/dL (ref 32.0–36.0)
MCV: 93.7 fL (ref 80.0–100.0)
Platelets: 146 10*3/uL — ABNORMAL LOW (ref 150–440)
RBC: 3.91 MIL/uL (ref 3.80–5.20)
RDW: 16.2 % — AB (ref 11.5–14.5)
WBC: 4.1 10*3/uL (ref 3.6–11.0)

## 2015-04-25 LAB — HEPARIN LEVEL (UNFRACTIONATED): Heparin Unfractionated: 0.36 IU/mL (ref 0.30–0.70)

## 2015-04-25 LAB — CREATININE, SERUM
Creatinine, Ser: 0.92 mg/dL (ref 0.44–1.00)
GFR calc non Af Amer: >60 mL/min (ref >60)

## 2015-04-25 LAB — PROTIME-INR
INR: 0.94
Prothrombin Time: 12.8 seconds (ref 11.4–15.0)

## 2015-04-25 LAB — APTT: aPTT: 73 seconds — ABNORMAL HIGH (ref 24–36)

## 2015-04-25 LAB — BUN: BUN: 24 mg/dL — AB (ref 6–20)

## 2015-04-25 SURGERY — LOWER EXTREMITY ANGIOGRAPHY
Anesthesia: Moderate Sedation | Laterality: Left

## 2015-04-25 MED ORDER — SODIUM CHLORIDE 0.9 % IJ SOLN
3.0000 mL | Freq: Two times a day (BID) | INTRAMUSCULAR | Status: DC
Start: 2015-04-25 — End: 2015-04-26

## 2015-04-25 MED ORDER — ACETAMINOPHEN 325 MG PO TABS: 650.0000 mg | ORAL_TABLET | Freq: Four times a day (QID) | ORAL | Status: DC | PRN

## 2015-04-25 MED ORDER — FENTANYL CITRATE (PF) 100 MCG/2ML IJ SOLN
INTRAMUSCULAR | Status: AC
  Filled 2015-04-25: qty 2

## 2015-04-25 MED ORDER — FERROUS SULFATE 325 (65 FE) MG PO TABS
325.0000 mg | ORAL_TABLET | Freq: Every day | ORAL | Status: DC
  Administered 2015-04-25 – 2015-04-29 (×4): 325 mg via ORAL
  Filled 2015-04-25 (×7): qty 1

## 2015-04-25 MED ORDER — HEPARIN (PORCINE) IN NACL 2-0.9 UNIT/ML-% IJ SOLN
INTRAMUSCULAR | Status: AC
Start: 2015-04-25 — End: 2015-04-25
  Filled 2015-04-25: qty 500

## 2015-04-25 MED ORDER — DEXTROSE-NACL 5-0.9 % IV SOLN
INTRAVENOUS | Status: DC
  Administered 2015-04-25 – 2015-04-27 (×4): via INTRAVENOUS

## 2015-04-25 MED ORDER — ASPIRIN EC 325 MG PO TBEC
325.0000 mg | DELAYED_RELEASE_TABLET | Freq: Every day | ORAL | Status: DC
  Administered 2015-04-25 – 2015-04-28 (×3): 325 mg via ORAL
  Filled 2015-04-25 (×3): qty 1

## 2015-04-25 MED ORDER — CHLORHEXIDINE GLUCONATE 4 % EX LIQD: 1.0000 "application " | Freq: Once | CUTANEOUS | Status: DC

## 2015-04-25 MED ORDER — HEPARIN SODIUM (PORCINE) 1000 UNIT/ML IJ SOLN
INTRAMUSCULAR | Status: AC
  Filled 2015-04-25: qty 1

## 2015-04-25 MED ORDER — HYDROMORPHONE HCL 1 MG/ML IJ SOLN
0.5000 mg | Freq: Once | INTRAMUSCULAR | Status: AC | PRN
Start: 2015-04-25 — End: 2015-04-25
  Administered 2015-04-25: 1 mg via INTRAVENOUS

## 2015-04-25 MED ORDER — ONDANSETRON HCL 4 MG/2ML IJ SOLN
4.0000 mg | Freq: Four times a day (QID) | INTRAMUSCULAR | Status: DC | PRN
  Administered 2015-04-26 (×2): 4 mg via INTRAVENOUS
  Filled 2015-04-25: qty 2

## 2015-04-25 MED ORDER — ENSURE ENLIVE PO LIQD
237.0000 mL | Freq: Three times a day (TID) | ORAL | Status: DC
  Administered 2015-04-25 – 2015-04-29 (×8): 237 mL via ORAL

## 2015-04-25 MED ORDER — MORPHINE SULFATE (PF) 4 MG/ML IV SOLN
2.0000 mg | INTRAVENOUS | Status: DC | PRN
  Administered 2015-04-25: 2 mg via INTRAVENOUS
  Filled 2015-04-25: qty 1

## 2015-04-25 MED ORDER — FLEET ENEMA 7-19 GM/118ML RE ENEM: 1.0000 | ENEMA | Freq: Once | RECTAL | Status: DC | PRN

## 2015-04-25 MED ORDER — ONDANSETRON HCL 4 MG/2ML IJ SOLN
INTRAMUSCULAR | Status: AC
Start: 2015-04-25 — End: 2015-04-26
  Filled 2015-04-25: qty 2

## 2015-04-25 MED ORDER — FENTANYL CITRATE (PF) 100 MCG/2ML IJ SOLN
INTRAMUSCULAR | Status: DC | PRN
  Administered 2015-04-25 (×4): 50 ug via INTRAVENOUS

## 2015-04-25 MED ORDER — CEFAZOLIN SODIUM 1-5 GM-% IV SOLN
1.0000 g | Freq: Once | INTRAVENOUS | Status: AC
  Administered 2015-04-25: 1 g via INTRAVENOUS

## 2015-04-25 MED ORDER — MAGNESIUM HYDROXIDE 400 MG/5ML PO SUSP
30.0000 mL | Freq: Every day | ORAL | Status: DC | PRN
  Filled 2015-04-25: qty 30

## 2015-04-25 MED ORDER — ASPIRIN EC 81 MG PO TBEC: 81.0000 mg | DELAYED_RELEASE_TABLET | Freq: Every day | ORAL | Status: DC

## 2015-04-25 MED ORDER — DOCUSATE SODIUM 100 MG PO CAPS
100.0000 mg | ORAL_CAPSULE | Freq: Two times a day (BID) | ORAL | Status: DC
  Administered 2015-04-26 – 2015-04-29 (×6): 100 mg via ORAL
  Filled 2015-04-25 (×10): qty 1

## 2015-04-25 MED ORDER — PANTOPRAZOLE SODIUM 40 MG PO TBEC
40.0000 mg | DELAYED_RELEASE_TABLET | Freq: Every day | ORAL | Status: DC
  Administered 2015-04-25 – 2015-04-29 (×4): 40 mg via ORAL
  Filled 2015-04-25 (×4): qty 1

## 2015-04-25 MED ORDER — SORBITOL 70 % SOLN
30.0000 mL | Freq: Every day | Status: DC | PRN
  Filled 2015-04-25: qty 30

## 2015-04-25 MED ORDER — HEPARIN BOLUS VIA INFUSION
2500.0000 [IU] | Freq: Once | INTRAVENOUS | Status: AC
  Administered 2015-04-25: 2500 [IU] via INTRAVENOUS
  Filled 2015-04-25: qty 2500

## 2015-04-25 MED ORDER — LOSARTAN POTASSIUM 50 MG PO TABS
50.0000 mg | ORAL_TABLET | Freq: Every day | ORAL | Status: DC
  Administered 2015-04-25 – 2015-04-29 (×4): 50 mg via ORAL
  Filled 2015-04-25 (×7): qty 1

## 2015-04-25 MED ORDER — ALUM & MAG HYDROXIDE-SIMETH 200-200-20 MG/5ML PO SUSP
ORAL | Status: AC
Start: 2015-04-25 — End: 2015-04-26
  Filled 2015-04-25: qty 30

## 2015-04-25 MED ORDER — CEFAZOLIN SODIUM 1-5 GM-% IV SOLN
INTRAVENOUS | Status: AC
  Filled 2015-04-25: qty 50

## 2015-04-25 MED ORDER — SODIUM CHLORIDE 0.9 % IV SOLN: INTRAVENOUS | Status: DC

## 2015-04-25 MED ORDER — HEPARIN (PORCINE) IN NACL 2-0.9 UNIT/ML-% IJ SOLN
INTRAMUSCULAR | Status: AC
  Filled 2015-04-25: qty 1000

## 2015-04-25 MED ORDER — ALUM & MAG HYDROXIDE-SIMETH 200-200-20 MG/5ML PO SUSP
30.0000 mL | Freq: Four times a day (QID) | ORAL | Status: DC | PRN
  Administered 2015-04-25: 30 mL via ORAL

## 2015-04-25 MED ORDER — HYDROMORPHONE HCL 1 MG/ML IJ SOLN
INTRAMUSCULAR | Status: AC
  Administered 2015-04-25: 1 mg via INTRAVENOUS
  Filled 2015-04-25: qty 1

## 2015-04-25 MED ORDER — HEPARIN SODIUM (PORCINE) 1000 UNIT/ML IJ SOLN
INTRAMUSCULAR | Status: DC | PRN
  Administered 2015-04-25: 5000 [IU] via INTRAVENOUS

## 2015-04-25 MED ORDER — HYDROCORTISONE 2.5 % RE CREA
1.0000 "application " | TOPICAL_CREAM | Freq: Two times a day (BID) | RECTAL | Status: DC
  Filled 2015-04-25: qty 28.35

## 2015-04-25 MED ORDER — ACETAMINOPHEN 650 MG RE SUPP
650.0000 mg | Freq: Four times a day (QID) | RECTAL | Status: DC | PRN
  Filled 2015-04-25: qty 1

## 2015-04-25 MED ORDER — ATORVASTATIN CALCIUM 20 MG PO TABS
80.0000 mg | ORAL_TABLET | ORAL | Status: DC
  Administered 2015-04-27 – 2015-04-29 (×3): 80 mg via ORAL
  Filled 2015-04-25: qty 1
  Filled 2015-04-25 (×3): qty 4

## 2015-04-25 MED ORDER — MIDAZOLAM HCL 5 MG/5ML IJ SOLN
INTRAMUSCULAR | Status: AC
  Filled 2015-04-25: qty 5

## 2015-04-25 MED ORDER — ONDANSETRON HCL 4 MG PO TABS
4.0000 mg | ORAL_TABLET | Freq: Four times a day (QID) | ORAL | Status: DC | PRN
  Administered 2015-04-25: 4 mg via ORAL

## 2015-04-25 MED ORDER — HYDROCODONE-ACETAMINOPHEN 5-325 MG PO TABS
1.0000 | ORAL_TABLET | ORAL | Status: DC | PRN
  Administered 2015-04-27 – 2015-04-28 (×4): 2 via ORAL
  Filled 2015-04-25 (×4): qty 2

## 2015-04-25 MED ORDER — HEPARIN (PORCINE) IN NACL 100-0.45 UNIT/ML-% IJ SOLN
INTRAMUSCULAR | Status: AC
  Filled 2015-04-25: qty 250

## 2015-04-25 MED ORDER — LIDOCAINE-EPINEPHRINE (PF) 1 %-1:200000 IJ SOLN
INTRAMUSCULAR | Status: DC | PRN
  Administered 2015-04-25: 10 mL via INTRADERMAL

## 2015-04-25 MED ORDER — LIDOCAINE-EPINEPHRINE (PF) 1 %-1:200000 IJ SOLN
INTRAMUSCULAR | Status: AC
  Filled 2015-04-25: qty 30

## 2015-04-25 MED ORDER — DEXTROSE 5 % IV SOLN
1.5000 g | INTRAVENOUS | Status: DC
  Filled 2015-04-25: qty 1.5

## 2015-04-25 MED ORDER — HEPARIN (PORCINE) IN NACL 100-0.45 UNIT/ML-% IJ SOLN
900.0000 [IU]/h | INTRAMUSCULAR | Status: DC
  Administered 2015-04-25 – 2015-04-26 (×3): 1000 [IU]/h via INTRAVENOUS
  Filled 2015-04-25 (×3): qty 250

## 2015-04-25 MED ORDER — SODIUM CHLORIDE 0.9 % IV SOLN
INTRAVENOUS | Status: DC
  Administered 2015-04-25: 09:00:00 via INTRAVENOUS

## 2015-04-25 MED ORDER — METOPROLOL TARTRATE 100 MG PO TABS
100.0000 mg | ORAL_TABLET | Freq: Every day | ORAL | Status: DC
  Administered 2015-04-25 – 2015-04-29 (×5): 100 mg via ORAL
  Filled 2015-04-25 (×7): qty 1

## 2015-04-25 MED ORDER — MIDAZOLAM HCL 2 MG/2ML IJ SOLN
INTRAMUSCULAR | Status: DC | PRN
  Administered 2015-04-25: 2 mg via INTRAVENOUS
  Administered 2015-04-25 (×2): 1 mg via INTRAVENOUS

## 2015-04-25 SURGICAL SUPPLY — 27 items
BALLN DORADO 6X200X135 (BALLOONS) ×3
BALLN LUTONIX 5X150X130 (BALLOONS) ×3
BALLN LUTONIX DCB 4X100X130 (BALLOONS) ×3
BALLN LUTONIX DCB 5X100X130 (BALLOONS) ×3
BALLN ULTRVRSE 2.5X300X150 (BALLOONS) ×3
BALLN ULTRVRSE 6X60X130C (BALLOONS) ×3
BALLOON DORADO 6X200X135 (BALLOONS) ×1 IMPLANT
BALLOON LUTONIX 5X150X130 (BALLOONS) ×1 IMPLANT
BALLOON LUTONIX DCB 4X100X130 (BALLOONS) ×1 IMPLANT
BALLOON LUTONIX DCB 5X100X130 (BALLOONS) ×1 IMPLANT
BALLOON ULTRVRSE 2.5X300X150 (BALLOONS) ×1 IMPLANT
BALLOON ULTRVRSE 6X60X130C (BALLOONS) ×1 IMPLANT
CANNULA NASAL 8 HUDSON (TUBING) ×3 IMPLANT
CATH KMP 5.0X100 (CATHETERS) ×3 IMPLANT
CATH ROYAL FLUSH PIG 5F 70CM (CATHETERS) ×3 IMPLANT
DEVICE PRESTO INFLATION (MISCELLANEOUS) ×3 IMPLANT
DEVICE SOLENT OMNI 120CM (CATHETERS) ×3 IMPLANT
DEVICE STARCLOSE SE CLOSURE (Vascular Products) ×3 IMPLANT
GLIDEWIRE ADV .035X260CM (WIRE) ×3 IMPLANT
PACK ANGIOGRAPHY (CUSTOM PROCEDURE TRAY) ×3 IMPLANT
SHEATH ANL2 6FRX45 HC (SHEATH) ×3 IMPLANT
SHEATH BRITE TIP 5FRX11 (SHEATH) ×3 IMPLANT
STENT VIABAHN 6X250X120 (Permanent Stent) ×3 IMPLANT
SYR MEDRAD MARK V 150ML (SYRINGE) ×3 IMPLANT
TUBING CONTRAST HIGH PRESS 72 (TUBING) ×3 IMPLANT
WIRE G V18X300CM (WIRE) ×3 IMPLANT
WIRE J 3MM .035X145CM (WIRE) ×3 IMPLANT

## 2015-04-25 NOTE — Progress Notes (Signed)
ANTICOAGULATION CONSULT NOTE - Initial Consult  Pharmacy Consult for Heparin  Indication: arterial thrombus per vascular  Allergies  Allergen Reactions  . Aspirin Nausea And Vomiting and Other (See Comments)    Pt states aspirin makes her cramp and have to use coated kind.    Patient Measurements:   Heparin Dosing Weight: 57.2 kg  Vital Signs: Temp: 97.7 F (36.5 C) (08/22 0836) Temp Source: Oral (08/22 0836) BP: 204/96 mmHg (08/22 1235) Pulse Rate: 54 (08/22 1235)  Labs:  Recent Labs  04/25/15 0903  CREATININE 0.92    Estimated Creatinine Clearance: 56.9 mL/min (by C-G formula based on Cr of 0.92).   Medical History: Past Medical History  Diagnosis Date  . 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negative cardiac cath  . COPD (chronic obstructive pulmonary disease)   . Hypertension   . Peripheral vascular disease   . Nicotine addiction   . Asthma   . Hemorrhoids   . Arthritis   . Coronary artery disease     Medications:  Scheduled:  . aspirin EC  325 mg Oral Daily  . [START ON 04/26/2015] atorvastatin  80 mg Oral [id number]  . cefUROXime (ZINACEF)  IV  1.5 g Intravenous 30 min Pre-Op  . chlorhexidine  1 application Topical Once  . docusate sodium  100 mg Oral BID  . feeding supplement (ENSURE ENLIVE)  237 mL Oral TID WC  . ferrous sulfate  325 mg Oral Daily  . heparin  2,500 Units Intravenous Once  . hydrocortisone  1 application Rectal BID  . losartan  50 mg Oral Daily  . metoprolol  100 mg Oral Daily  . 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 Patient not on any anticoagulants based on PTA med rec. MD would like 2500 units heparin bolus then start heparin gtt per protocol. 2500 units bolus is less than what would normally be dosed per protocol. ~43.7 units/kg for bolus compared to 50-70 units/kg which is the normal for  Indication   Goal of Therapy:  Heparin level 0.3-0.7 units/ml Monitor  platelets by anticoagulation protocol: Yes   Plan:  Give 2500 units bolus x 1 Start heparin infusion at 1000 units/hr Check anti-Xa level in 6 hours and daily while on heparin Continue to monitor H&H and platelets  Ramon Zanders D 04/25/2015,1:10 PM

## 2015-04-25 NOTE — Progress Notes (Signed)
Pt c/o heartburn/indigestion.  Pt refuses Mylanta.  Dr. Lucky Cowboy paged for alternate medication.  Awaiting response.    Will continue to monitor.

## 2015-04-25 NOTE — OR Nursing (Signed)
Patient reporting "my foot feels numb", left foot cool to touch, sole of foot pale in color, unable to doppler DP or PT pulses, MD made aware, VO to go ahead and start Heparin bolus and drip, patient to be admitted with OR tomorrow per Dr Lucky Cowboy, Pharmancy called in reguards to heparin orders, ...awaiting for orders

## 2015-04-25 NOTE — H&P (Signed)
  [facility] History & Physical Update  The patient was interviewed and re-examined.  The patient's previous History and Physical has been reviewed and is unchanged.  There is no change in the plan of care. We plan to proceed with the scheduled procedure.  Erika Ballinas, MD  04/25/2015, 9:37 AM

## 2015-04-25 NOTE — Op Note (Signed)
[facility] Percutaneous Study/Intervention Procedural Note   Date of Surgery: 04/25/2015  Surgeon(s):,   Assistants:none  Pre-operative Diagnosis: PAD with short distance claudication and borderline ischemic rest pain, acute on chronic ischemia  Post-operative diagnosis: Same  Procedure(s) Performed: 1. Ultrasound guidance for vascular access right femoral artery 2. Catheter placement into left peroneal and posterior tibial arteries from right femoral approach 3. Aortogram and selective left lower extremity angiogram  4.  Catheter directed thrombolysis with 8 mg of TPA delivered with the AngioJet Omni catheter and a power pulse spray fashion to the common femoral artery, left superficial femoral artery, popliteal artery, tibioperoneal trunk, and proximal posterior tibial arteries 5. Mechanical rheolytic thrombectomy with the AngioJet Omni catheter to the common femoral artery, left superficial femoral artery, popliteal artery, tibioperoneal trunk, and proximal posterior tibial arteries 6. Percutaneous transluminal angioplasty of the proximal left superficial femoral artery and common femoral artery with 5 mm diameter drug-coated angioplasty balloon and 6 mm diameter conventional angioplasty balloon  7.  Percutaneous transluminal angioplasty of distal SFA and popliteal artery with 5 mm diameter drug-coated angioplasty balloon  8.  Percutaneous transluminal angioplasty of the distal popliteal artery and tibioperoneal trunk with 4 mm diameter drug-coated angioplasty balloon  9.  Percutaneous transluminal angioplasty of right posterior tibial artery with 2.5 mm diameter angioplasty balloon  10. Percutaneous transluminal angioplasty of right peroneal artery with 2.5 mm diameter angioplasty balloon  11. Viabahn covered stent placement to the distal SFA and popliteal artery with 6 mm diameter  Viabahn stent for greater than 50% residual stenosis in multiple areas after angioplasty 12. StarClose closure device right femoral artery  EBL: 15 cc  Indications: Patient is a 59 yo AAF  with acute on chronic ischemia of the LLE. The patient has noninvasive study showing a markedly reduced ABI on the side that has been previously treated. The patient is brought in for angiography for further evaluation and potential treatment. Risks and benefits are discussed and informed consent is obtained  Procedure: The patient was identified and appropriate procedural time out was performed. The patient was then placed supine on the table and prepped and draped in the usual sterile fashion. Ultrasound was used to evaluate the right common femoral artery. It was patent . A digital ultrasound image was acquired. A Seldinger needle was used to access the right common femoral artery under direct ultrasound guidance and a permanent image was performed. A 0.035 J wire was advanced without resistance and a 5Fr sheath was placed. Pigtail catheter was placed into the aorta and an AP aortogram was performed. This demonstrated normal renal arteries and normal aorta and iliac segments without significant stenosis. I then crossed the aortic bifurcation and advanced to the left femoral head. Selective left lower extremity angiogram was then performed. This demonstrated thrombosis of the left superficial femoral artery entirely with very poor reconstitution distally in the tibial vessels that was hard to opacify.. The patient was systemically heparinized and a 6 [place] [employer] sheath was then placed over the [employer] wire. I then used a Kumpe catheter and the advantage wire to navigate through the thrombosed SFA and into the occluded stents in the popliteal artery. I then advanced into the tibioperoneal trunk and evaluated the peroneal artery and posterior tibial arteries which had areas of  stenosis and thrombus throughout. I instilled 8 mg of TPA with the AngioJet Omni catheter from the common femoral artery down into the proximal posterior tibial artery and a power pulse spray  fashion. This was allowed to dwell for 10-15 minutes. I then performed mechanical rheolytic thrombectomy with the AngioJet Omni catheter from the common femoral artery down through the SFA, popliteal artery, tibioperoneal trunk, and into the posterior tibial artery. This uncovered high-grade residual stenosis within the fractured popliteal artery stents and residual stenosis and thrombus in the proximal SFA as well as residual tibial thrombus. I was able to navigate into the foot from the posterior tibial artery and exchanged for a 0.018 wire. I treated the posterior tibial artery from just above the ankle up through the tibioperoneal trunk with a 2.5 mm diameter angioplasty balloon with excellent angiographic completion result and no significant residual stenosis. I then cannulated the peroneal artery and cross this down to the lateral tarsal artery in the foot and treated the peroneal artery from the tibioperoneal trunk to the distal peroneal artery with a 2.5 mm diameter angioplasty balloon as well. This resulted in significantly improved flow although distally it still drained into collaterals into the foot. I treated the tibioperoneal trunk and distal popliteal artery with a 4 mm diameter Lutonix drug-coated angioplasty balloon inflated to 10 atm for 1 minute. The proximal superficial femoral artery and distal SFA I used a 5 mm diameter Lutonix drug-coated angioplasty balloon inflated to 12 atm for 1 minute. Significant residual stenosis was identified particularly in the fractured portion of the previously placed stent. I elected to cover this area with a 6 mm diameter by 25 cm length Viabahn covered stent postdilated with a 6 mm diameter high pressure balloon proximally and a 4 mm balloon distally. This traversed from  the distal SFA to the distal popliteal artery. Angiogram following this showed this to be widely patent with no residual stenosis. I then turned my attention to the proximal SFA and common femoral artery. Mechanical rheolytic thrombectomy was run twice more to try to debulk the clot in this location with no significant success. I treated the lesion with a 5 mm diameter Lutonix drug-coated angioplasty balloon and then a 6 mm diameter conventional angioplasty balloon both inflated for 1 minute. Nonetheless, there was stenosis at the origin of the SFA with thrombus that was in the distal common femoral artery and partially occluding the profunda femoris artery. I felt this would not be a lesion that was appropriate to treat with a covered stent as it would sacrifice the profunda femoris artery. I felt that particularly given the patient's younger age and acceptable surgical status, this should be addressed with a femoral thromboendarterectomy. I elected to terminate the procedure. The sheath was removed and StarClose closure device was deployed in the left femoral artery with excellent hemostatic result. The patient was taken to the recovery room in stable condition having tolerated the procedure well. she will remain on anticoagulation and we will plan to take her to surgery for surgical treatment of the common femoral artery and the origins of the SFA and profunda femoris artery.   Findings:  Aortogram: moderate left renal artery stenosis in both left renal arteries, no aorta or iliac stenosis Left Lower Extremity: extensive thrombosis and reocclusion, improved and resolved except in distal CFA and origins of SFA and profunda.  To be managed surgically.   Disposition: Patient was taken to the recovery room in stable condition having tolerated the procedure well.  Complications: None  [name] 04/25/2015 12:17 PM

## 2015-04-25 NOTE — Plan of Care (Signed)
Problem: Consults Goal: Diagnosis - Venous Thromboembolism (VTE) Choose a selection Outcome: Progressing DVT (Deep Vein Thrombosis)     

## 2015-04-25 NOTE — Progress Notes (Signed)
2058 Dr. Delana Meyer notified of bloody urine; Acknowledged, new order placed; Barbaraann Faster, RN; 04/25/2015; 9:06 PM

## 2015-04-26 ENCOUNTER — Encounter: Payer: Self-pay | Admitting: Vascular Surgery

## 2015-04-26 ENCOUNTER — Inpatient Hospital Stay: Payer: Medicare Other | Admitting: Registered Nurse

## 2015-04-26 ENCOUNTER — Encounter
Admission: AD | Disposition: A | Discharge status: Home Health | Payer: Medicare Other | Source: Ambulatory Visit | Attending: Vascular Surgery

## 2015-04-26 HISTORY — PX: THROMBECTOMY FEMORAL ARTERY: SHX6406

## 2015-04-26 HISTORY — PX: ENDARTERECTOMY FEMORAL: SHX5804

## 2015-04-26 LAB — CBC
HEMATOCRIT: 32.7 % — AB (ref 35.0–47.0)
HEMOGLOBIN: 10.7 g/dL — AB (ref 12.0–16.0)
MCH: 30.4 pg (ref 26.0–34.0)
MCHC: 32.6 g/dL (ref 32.0–36.0)
MCV: 93.2 fL (ref 80.0–100.0)
Platelets: 130 10*3/uL — ABNORMAL LOW (ref 150–440)
RBC: 3.51 MIL/uL — AB (ref 3.80–5.20)
RDW: 15.8 % — ABNORMAL HIGH (ref 11.5–14.5)
WBC: 5.5 10*3/uL (ref 3.6–11.0)

## 2015-04-26 LAB — HEPARIN LEVEL (UNFRACTIONATED): HEPARIN UNFRACTIONATED: 0.4 [IU]/mL (ref 0.30–0.70)

## 2015-04-26 LAB — SURGICAL PCR SCREEN
MRSA, PCR: NEGATIVE
Staphylococcus aureus: NEGATIVE

## 2015-04-26 SURGERY — THROMBECTOMY, ARTERY, FEMORAL
Anesthesia: General | Laterality: Left | Wound class: Clean

## 2015-04-26 MED ORDER — MIDAZOLAM HCL 2 MG/2ML IJ SOLN
INTRAMUSCULAR | Status: DC | PRN
  Administered 2015-04-26: 2 mg via INTRAVENOUS

## 2015-04-26 MED ORDER — NON FORMULARY
Status: DC | PRN
  Administered 2015-04-26: 500 mL

## 2015-04-26 MED ORDER — LIDOCAINE HCL (CARDIAC) 20 MG/ML IV SOLN
INTRAVENOUS | Status: DC | PRN
  Administered 2015-04-26: 50 mg via INTRAVENOUS

## 2015-04-26 MED ORDER — DEXAMETHASONE SODIUM PHOSPHATE 4 MG/ML IJ SOLN
INTRAMUSCULAR | Status: DC | PRN
  Administered 2015-04-26: 4 mg via INTRAVENOUS

## 2015-04-26 MED ORDER — FENTANYL CITRATE (PF) 100 MCG/2ML IJ SOLN: 25.0000 ug | INTRAMUSCULAR | Status: DC | PRN

## 2015-04-26 MED ORDER — MORPHINE SULFATE (PF) 2 MG/ML IV SOLN
2.0000 mg | INTRAVENOUS | Status: DC | PRN
Start: 2015-04-26 — End: 2015-04-29
  Administered 2015-04-26 – 2015-04-29 (×15): 2 mg via INTRAVENOUS
  Filled 2015-04-26 (×14): qty 1

## 2015-04-26 MED ORDER — PROMETHAZINE HCL 25 MG/ML IJ SOLN: 6.2500 mg | INTRAMUSCULAR | Status: DC | PRN

## 2015-04-26 MED ORDER — SODIUM CHLORIDE 0.9 % IR SOLN
Status: DC | PRN
  Administered 2015-04-26: 500 mL

## 2015-04-26 MED ORDER — FENTANYL CITRATE (PF) 100 MCG/2ML IJ SOLN
INTRAMUSCULAR | Status: DC | PRN
  Administered 2015-04-26: 100 ug via INTRAVENOUS

## 2015-04-26 MED ORDER — HEPARIN SODIUM (PORCINE) 1000 UNIT/ML IJ SOLN
INTRAMUSCULAR | Status: DC | PRN
  Administered 2015-04-26: 4000 [IU] via INTRAVENOUS

## 2015-04-26 MED ORDER — PROPOFOL 10 MG/ML IV BOLUS
INTRAVENOUS | Status: DC | PRN
  Administered 2015-04-26: 50 mg via INTRAVENOUS
  Administered 2015-04-26: 150 mg via INTRAVENOUS

## 2015-04-26 MED ORDER — CEFAZOLIN SODIUM 1-5 GM-% IV SOLN: INTRAVENOUS | Status: DC | PRN

## 2015-04-26 MED ORDER — LACTATED RINGERS IV SOLN
INTRAVENOUS | Status: DC | PRN
  Administered 2015-04-26 (×2): via INTRAVENOUS

## 2015-04-26 MED ORDER — SUCCINYLCHOLINE CHLORIDE 20 MG/ML IJ SOLN
INTRAMUSCULAR | Status: DC | PRN
  Administered 2015-04-26: 100 mg via INTRAVENOUS

## 2015-04-26 MED ORDER — CEFAZOLIN SODIUM 1-5 GM-% IV SOLN
INTRAVENOUS | Status: DC | PRN
  Administered 2015-04-26: 1 g via INTRAVENOUS

## 2015-04-26 MED ORDER — EVICEL 2 ML EX KIT
PACK | CUTANEOUS | Status: DC | PRN
  Administered 2015-04-26: 2 mL

## 2015-04-26 MED ORDER — EPHEDRINE SULFATE 50 MG/ML IJ SOLN
INTRAMUSCULAR | Status: DC | PRN
  Administered 2015-04-26: 5 mg via INTRAVENOUS
  Administered 2015-04-26 (×2): 10 mg via INTRAVENOUS

## 2015-04-26 SURGICAL SUPPLY — 66 items
APPLIER CLIP 11 MED OPEN (CLIP)
APPLIER CLIP 13 LRG OPEN (CLIP)
APPLIER CLIP 9.375 SM OPEN (CLIP)
BAG COUNTER SPONGE EZ (MISCELLANEOUS) ×2 IMPLANT
BAG DECANTER STRL (MISCELLANEOUS) ×2 IMPLANT
BAG ISOLATATION DRAPE 20X20 ST (DRAPES) ×1 IMPLANT
BLADE SURG 15 STRL LF DISP TIS (BLADE) ×1 IMPLANT
BLADE SURG 15 STRL SS (BLADE) ×1
BLADE SURG SZ11 CARB STEEL (BLADE) ×2 IMPLANT
BOOT SUTURE AID YELLOW STND (SUTURE) ×2 IMPLANT
BRUSH SCRUB 4% CHG (MISCELLANEOUS) IMPLANT
CANISTER SUCT 1200ML W/VALVE (MISCELLANEOUS) ×2 IMPLANT
CATH EMBOLECTOMY 3X80 (CATHETERS) ×2 IMPLANT
CATH TRAY 16F METER LATEX (MISCELLANEOUS) ×2 IMPLANT
CLIP APPLIE 11 MED OPEN (CLIP) IMPLANT
CLIP APPLIE 13 LRG OPEN (CLIP) IMPLANT
CLIP APPLIE 9.375 SM OPEN (CLIP) IMPLANT
DRAPE INCISE IOBAN 66X45 STRL (DRAPES) ×2 IMPLANT
DRAPE ISOLATE BAG 20X20 STRL (DRAPES) ×1
DRAPE UNIVERSAL PACK (DRAPES) ×2 IMPLANT
DRSG OPSITE POSTOP 4X6 (GAUZE/BANDAGES/DRESSINGS) ×2 IMPLANT
DURAPREP 26ML APPLICATOR (WOUND CARE) ×2 IMPLANT
ELECT CAUTERY BLADE 6.4 (BLADE) ×2 IMPLANT
EVICEL AIRLESS SPRAY ACCES (MISCELLANEOUS) ×2 IMPLANT
GEL ULTRASOUND 20GR AQUASONIC (MISCELLANEOUS) IMPLANT
GLOVE BIO SURGEON STRL SZ7 (GLOVE) ×10 IMPLANT
GOWN STRL REUS W/ TWL LRG LVL3 (GOWN DISPOSABLE) ×3 IMPLANT
GOWN STRL REUS W/ TWL XL LVL3 (GOWN DISPOSABLE) ×1 IMPLANT
GOWN STRL REUS W/TWL LRG LVL3 (GOWN DISPOSABLE) ×3
GOWN STRL REUS W/TWL XL LVL3 (GOWN DISPOSABLE) ×1
HEMOSTAT SURGICEL 2X3 (HEMOSTASIS) ×2 IMPLANT
IV NS 500ML (IV SOLUTION) ×1
IV NS 500ML BAXH (IV SOLUTION) ×1 IMPLANT
KIT RM TURNOVER STRD PROC AR (KITS) ×2 IMPLANT
LABEL OR SOLS (LABEL) ×2 IMPLANT
LIQUID BAND (GAUZE/BANDAGES/DRESSINGS) ×2 IMPLANT
LOOP RED MAXI  1X406MM (MISCELLANEOUS) ×3
LOOP VESSEL MAXI 1X406 RED (MISCELLANEOUS) ×3 IMPLANT
LOOP VESSEL MINI 0.8X406 BLUE (MISCELLANEOUS) ×3 IMPLANT
LOOPS BLUE MINI 0.8X406MM (MISCELLANEOUS) ×3
MICROPUNCTURE 5FR NT-SST (MISCELLANEOUS) IMPLANT
NS IRRIG 500ML POUR BTL (IV SOLUTION) ×2 IMPLANT
PACK BASIN MAJOR ARMC (MISCELLANEOUS) ×2 IMPLANT
PAD GROUND ADULT SPLIT (MISCELLANEOUS) ×2 IMPLANT
PAD PREP 24X41 OB/GYN DISP (PERSONAL CARE ITEMS) ×2 IMPLANT
PATCH CAROTID ECM VASC 1X10 (Prosthesis & Implant Heart) ×2 IMPLANT
PENCIL ELECTRO HAND CTR (MISCELLANEOUS) IMPLANT
SUT MNCRL 4-0 (SUTURE) ×2
SUT MNCRL 4-018XMFL (SUTURE) ×2
SUT PROLENE 6 0 BV (SUTURE) ×6 IMPLANT
SUT PROLENE 7 0 BV 1 (SUTURE) ×14 IMPLANT
SUT SILK 2 0 (SUTURE) ×1
SUT SILK 2-0 18XBRD TIE 12 (SUTURE) ×1 IMPLANT
SUT SILK 3 0 (SUTURE) ×2
SUT SILK 3-0 18XBRD TIE 12 (SUTURE) ×2 IMPLANT
SUT SILK 4 0 (SUTURE) ×1
SUT SILK 4-0 18XBRD TIE 12 (SUTURE) ×1 IMPLANT
SUT VIC AB 2-0 CT1 27 (SUTURE) ×1
SUT VIC AB 2-0 CT1 TAPERPNT 27 (SUTURE) ×1 IMPLANT
SUT VIC AB 3-0 SH 27 (SUTURE)
SUT VIC AB 3-0 SH 27X BRD (SUTURE) IMPLANT
SUT VICRYL+ 3-0 36IN CT-1 (SUTURE) ×2 IMPLANT
SUTURE MNCRL 4-018XMF (SUTURE) ×2 IMPLANT
SYR 20CC LL (SYRINGE) IMPLANT
SYR 3ML LL SCALE MARK (SYRINGE) ×2 IMPLANT
SYR 5ML LL (SYRINGE) ×2 IMPLANT

## 2015-04-26 NOTE — Progress Notes (Signed)
Patient came back to the unit post left femoral artery  Thrombectomy/endarterectomy . Patient was brought back by the PACU care nurse. She was A&O X4, has heparin infusin, and has a foley catheter . Patient received a PRN pain med for pain and later stated that her pain is under control. Patient's gum  was bleeding from accidental tooth extraction during intubation. Patient maintained stable VS, asymptomatic sinus brady in the 50s, and has palpable pulses on all extremities. Will continue to monitor. [id number]: Dr Reece Levy was notified about patient's continuous bleeding from the gum where her tooth came off. Gauze was  Applied to patient's upper gum.

## 2015-04-26 NOTE — Op Note (Signed)
OPERATIVE NOTE   PROCEDURE: 1. Left common femoral, profunda femoris, and superficial femoral artery endarterectomies and Cormatrix patch angioplasty 2.   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  S/p infrainguinal revascularization with femoral artery disease 2. Tobacco dependence  POST-OPERATIVE DIAGNOSIS: Same  SURGEON: DEW,JASON, MD  ASSISTANT: Dr. Hortencia Pilar, MD  ANESTHESIA: general  ESTIMATED BLOOD LOSS: 50 cc  FINDING(S): 1. significant plaque in left common femoral, profunda femoris, and superficial femoral arteries.  Small amount of thrombus returned after passing a 3 Fogarty embolectomy catheter distally  SPECIMEN(S): Left common femoral, profunda femoris, and superficial femoral artery plaque and distal thrombus  INDICATIONS:  Patient presents with acute on chronic ischemia of the left lower extremity now with rest pain. She underwent angiography yesterday with partial percutaneous revascularization, but flow limiting thrombus and stenosis was present in the distal common femoral artery spilling into the proximal superficial femoral artery. This also impinged on the origin of the profunda femoris artery which also had plaque. She was admitted to the hospital and kept on a heparin drip overnight.  Left femoral endarterectomy is planned to try to improve perfusion. The risks and benefits as well as alternative therapies including intervention were reviewed in detail all questions were answered the patient agrees to proceed with surgery.  DESCRIPTION: After obtaining full informed written consent, the patient was brought back to the operating room and placed supine upon the operating table. The patient received IV antibiotics prior to induction. After obtaining adequate anesthesia, the patient was prepped and draped in the standard fashion  appropriate time out is called.   Vertical incision was created overlying the left femoral arteries. The common femoral artery proximally, and superficial femoral artery, and primary profunda femoris artery branches were encircled with vessel loops and prepared for control. The dissection was carried well down the superficial femoral artery about 5-6 cm. The left femoral arteries were found to have significant plaque from the common femoral artery into the profunda and superficial femoral arteries.   4000 units of heparin was given and allowed circulate for 5 minutes.   Attention is then turned to the left femoral artery. An arteriotomy is made with 11 blade and extended with Potts scissors in the common femoral artery and carried down onto the first 4-5 cm of the superficial femoral artery. An endarterectomy was then performed. The [facility] was used to create a plane. The proximal endpoint was cut flush with tenotomy scissors. This was in the proximal common femoral artery. An eversion endarterectomy was then performed for the first 2-3 cm of the profunda femoris artery with a bulky calcific plaque removed but no significant thrombus seen. Good backbleeding was then seen from the profunda femoris artery. The distal endpoint of the superficial femoral artery endarterectomy was created with gentle traction and the distal endpoint was tacked down with 3 7-0 Prolene sutures. From her angiogram, I expected more thrombus to be seen at the origin of the superficial femoral artery so I passed a 3 Fogarty embolectomy balloon about 60-65 cm downstream. 3 passes were created with only a small amount of thrombus returned. No thrombus was returned on the last pass and decent backbleeding was seen. All loose flecks from the arteriotomy were removed and a nice smooth wall was created. The Cormatrix patcth is then selected and prepared for a patch angioplasty.  It is cut and beveled and started at the  proximal  endpoint with a 6-0 Prolene suture.  Approximately one half of the suture line is run medially and laterally and the distal end point was cut and bevelled to match the arteriotomy.  A second 6-0 Prolene was started at the distal end point and run to the mid portion to complete the arteriotomy.  The vessel was flushed prior to release of control and completion of the anastomosis.  At this point, flow was established first to the profunda femoris artery and then to the superficial femoral artery. Easily palpable pulses are noted well beyond the anastomosis and both arteries.  The wound was then irrigated with sterile saline. Surgicel and Evicel topical hemostatic agents were placed in the femoral incision and hemostasis was complete. The femoral incision was then closed in a layered fashion with 2 layers of 2-0 Vicryl, 2 layers of 3-0 Vicryl, and 4-0 Monocryl for the skin closure. Dermabond and sterile dressing were then placed over the incision.  The patient was then awakened from anesthesia and taken to the recovery room in stable condition having tolerated the procedure well.  COMPLICATIONS: None  CONDITION: Stable     DEW,JASON 04/26/2015 9:10 PM

## 2015-04-26 NOTE — Transfer of Care (Signed)
Immediate Anesthesia Transfer of Care Note  Patient: [name]  Procedure(s) Performed: Procedure(s): THROMBECTOMY FEMORAL ARTERY (Left) ENDARTERECTOMY FEMORAL (Left)  Patient Location: PACU  Anesthesia Type:General  Level of Consciousness: awake, alert  and patient cooperative  Airway & Oxygen Therapy: Patient Spontanous Breathing and Patient connected to face mask oxygen  Post-op Assessment: Report given to RN and Post -op Vital signs reviewed and stable  Post vital signs: Reviewed and stable  Last Vitals:  Filed Vitals:   04/26/15 1512  BP: 122/58  Pulse: 58  Temp: 37 C  Resp: 16    Complications: No apparent anesthesia complications

## 2015-04-26 NOTE — Anesthesia Procedure Notes (Signed)
Procedure Name: Intubation Date/Time: 04/26/2015 6:40 PM Performed by: Rolla Plate Pre-anesthesia Checklist: Patient identified, Patient being monitored, Timeout performed, Emergency Drugs available and Suction available Patient Re-evaluated:Patient Re-evaluated prior to inductionOxygen Delivery Method: Circle system utilized Preoxygenation: Pre-oxygenation with 100% oxygen Intubation Type: IV induction and Rapid sequence Laryngoscope Size: Miller and 2 Grade View: Grade II Tube type: Oral Tube size: 7.0 mm Number of attempts: 1 Airway Equipment and Method: Stylet Placement Confirmation: ETT inserted through vocal cords under direct vision,  positive ETCO2 and breath sounds checked- equal and bilateral Secured at: 21 cm Tube secured with: Tape Dental Injury: Dental damage  Comments: R upper incisor dislodged, retrieved in one piece

## 2015-04-26 NOTE — Progress Notes (Addendum)
ANTICOAGULATION CONSULT NOTE - Initial Consult  Pharmacy Consult for Heparin  Indication: arterial thrombus per vascular  Allergies  Allergen Reactions  . Aspirin Nausea And Vomiting and Other (See Comments)    Pt states aspirin makes her cramp and have to use coated kind.    Patient Measurements: Height: 5\' 4"  (162.6 cm) Weight: 136 lb (61.689 kg) IBW/kg (Calculated) : 54.7 Heparin Dosing Weight: 57.2 kg  Vital Signs: Temp: 98.4 F (36.9 C) (08/22 2326) Temp Source: Oral (08/22 2326) BP: 146/85 mmHg (08/22 2326) Pulse Rate: 57 (08/22 2326)  Labs:  Recent Labs  04/25/15 0903 04/25/15 1501 04/25/15 2142  HGB  --  12.3  --   HCT  --  36.6  --   PLT  --  146*  --   APTT  --   --  73*  LABPROT  --   --  12.8  INR  --   --  0.94  HEPARINUNFRC  --   --  0.36  CREATININE 0.92  --  1.00    Estimated Creatinine Clearance: 52.3 mL/min (by C-G formula based on Cr of 1).   Medical History: Past Medical History  Diagnosis Date  . 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negative cardiac cath  . COPD (chronic obstructive pulmonary disease)   . Hypertension   . Peripheral vascular disease   . Nicotine addiction   . Asthma   . Hemorrhoids   . Arthritis   . Coronary artery disease     Medications:  Scheduled:  . alum & mag hydroxide-simeth      . aspirin EC  325 mg Oral Daily  . atorvastatin  80 mg Oral [id number]  . docusate sodium  100 mg Oral BID  . feeding supplement (ENSURE ENLIVE)  237 mL Oral TID WC  . ferrous sulfate  325 mg Oral Daily  . hydrocortisone  1 application Rectal BID  . losartan  50 mg Oral Daily  . metoprolol  100 mg Oral Daily  . pantoprazole  40 mg Oral Daily  . 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 Patient not on any anticoagulants based on PTA med rec. MD would like 2500 units heparin bolus then start heparin gtt per protocol. 2500 units bolus is less than what would normally  be dosed per protocol. ~43.7 units/kg for bolus compared to 50-70 units/kg which is the normal for  Indication   Goal of Therapy:  Heparin level 0.3-0.7 units/ml Monitor platelets by anticoagulation protocol: Yes   Plan:  Give 2500 units bolus x 1 Start heparin infusion at 1000 units/hr Check anti-Xa level in 6 hours and daily while on heparin Continue to monitor H&H and platelets   8/22 22:00 anti-Xa 0.36. Recheck in 6 hours to confirm  8/23 04:00 anti-Xa 0.40. Recheck with CBC in AM.   Sim Boast, PharmD, BCPS  04/26/2015

## 2015-04-26 NOTE — Anesthesia Preprocedure Evaluation (Signed)
Anesthesia Evaluation  Patient identified by MRN, date of birth, ID band Patient awake    Reviewed: Allergy & Precautions, H&P , NPO status , Patient's Chart, lab work & pertinent test results  History of Anesthesia Complications Negative for: history of anesthetic complications  Airway Mallampati: II  TM Distance: >3 FB Neck ROM: limited    Dental  (+) Poor Dentition, Chipped, Missing   Pulmonary neg pulmonary ROS, asthma , pneumonia -, resolved, COPDCurrent Smoker,  breath sounds clear to auscultation  Pulmonary exam normal       Cardiovascular Exercise Tolerance: Good hypertension, + CAD, + Past MI and + Peripheral Vascular Disease - Cardiac Stents and - CABG Normal cardiovascular examRhythm:regular Rate:Normal     Neuro/Psych negative neurological ROS  negative psych ROS   GI/Hepatic negative GI ROS, Neg liver ROS,   Endo/Other  negative endocrine ROS  Renal/GU negative Renal ROS  negative genitourinary   Musculoskeletal  (+) Arthritis -,   Abdominal   Peds  Hematology negative hematology ROS (+)   Anesthesia Other Findings Past Medical History:   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Comment:negative cardiac cath   COPD (chronic obstructive pulmonary disease)                 Hypertension                                                 Peripheral vascular disease                                  Nicotine addiction                                           Asthma                                                       Hemorrhoids                                                  Arthritis                                                    Patient reports cardiac clearance for this procedure  Reproductive/Obstetrics negative OB ROS                             Anesthesia Physical  Anesthesia Plan  ASA: III  Anesthesia Plan: General   Post-op Pain  Management:    Induction:   Airway Management Planned:   Additional Equipment:   Intra-op Plan:   Post-operative Plan:   Informed Consent: I have reviewed the patients History and Physical, chart, labs and discussed the procedure including the risks, benefits and alternatives for the proposed anesthesia with the patient or authorized representative who has indicated his/her understanding and acceptance.   Dental Advisory Given  Plan Discussed with: Anesthesiologist, CRNA and Surgeon  Anesthesia Plan Comments:         Anesthesia Quick Evaluation

## 2015-04-26 NOTE — Progress Notes (Signed)
[facility]  Daily Progress Note   Subjective  - 1 Day Post-Op  S/p extensive percutaneous revascularization but thrombus and plaque still limiting flow in femoral location.  TO be taken to OR today for this.  Foot still feels kind of numb.  Able to move it.  No major events overnight.  Objective Filed Vitals:   04/25/15 1717 04/25/15 2326 04/26/15 0534 04/26/15 0759  BP: 152/68 146/85  116/45  Pulse:  57  64  Temp: 98.1 F (36.7 C) 98.4 F (36.9 C)  98.5 F (36.9 C)  TempSrc: Oral Oral  Oral  Resp: 12 17  17   Height: 5\' 4"  (1.626 m)     Weight: 61.689 kg (136 lb)  61.689 kg (136 lb)   SpO2: 98% 95%  93%    Intake/Output Summary (Last 24 hours) at 04/26/15 0846 Last data filed at 04/26/15 0659  Gross per 24 hour  Intake   2212 ml  Output   1410 ml  Net    802 ml    PULM  CTAB CV  RRR VASC  No pulses noted in left foot.  Still able to move foot but says it feels numb.  Laboratory CBC    Component Value Date/Time   WBC 5.5 04/26/2015 0416   WBC 5.1 10/15/2013 1123   HGB 10.7* 04/26/2015 0416   HGB 12.1 10/15/2013 1123   HCT 32.7* 04/26/2015 0416   HCT 36.7 10/15/2013 1123   PLT 130* 04/26/2015 0416   PLT 153 10/15/2013 1123    BMET    Component Value Date/Time   NA 137 04/25/2015 2142   NA 131* 10/15/2013 1123   K 3.9 04/25/2015 2142   K 3.6 10/15/2013 1123   CL 110 04/25/2015 2142   CL 102 10/15/2013 1123   CO2 21* 04/25/2015 2142   CO2 24 10/15/2013 1123   GLUCOSE 120* 04/25/2015 2142   GLUCOSE 111* 10/15/2013 1123   BUN 24* 04/25/2015 2142   BUN 12 10/15/2013 1123   CREATININE 1.00 04/25/2015 2142   CREATININE 1.20 10/15/2013 1123   CALCIUM 8.9 04/25/2015 2142   CALCIUM 9.1 10/15/2013 1123   GFRNONAA >60 04/25/2015 2142   GFRNONAA 50* 10/15/2013 1123   GFRNONAA 36* 12/12/2011 1915   GFRAA >60 04/25/2015 2142   GFRAA 58* 10/15/2013 1123   GFRAA 44* 12/12/2011 1915    Assessment/Planning: POD # 1 s/p extensive LLE  revascularization percutaneously.  Still with femoral disease and for surgery today Foot still painful and somewhat numb. On heparin drip. Risks and benefits of femoral thromboendarterectomy discussed and she is agreeable to proceed.       DEW,JASON  04/26/2015, 8:46 AM

## 2015-04-26 NOTE — Progress Notes (Signed)
Initial Nutrition Assessment   INTERVENTION:   Meals and Snacks: Cater to patient preferences once diet able to be advanced Medical Food Supplement Therapy: Agree with Ensure as ordered, pt likes chocolate and strawberry   NUTRITION DIAGNOSIS:   Inadequate oral intake related to inability to eat as evidenced by NPO status.  GOAL:   Patient will meet greater than or equal to 90% of their needs  MONITOR:    (Energy Intake, Anthropometrics, Digestive System,)  REASON FOR ASSESSMENT:   Malnutrition Screening Tool    ASSESSMENT:   Pt POD # 1 s/p extensive LLE revascularization percutaneously; still with femoral disease and for surgery today per MD note.  Past Medical History  Diagnosis Date  . 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negative cardiac cath  . COPD (chronic obstructive pulmonary disease)   . Hypertension   . Peripheral vascular disease   . Nicotine addiction   . Asthma   . Hemorrhoids   . Arthritis   . Coronary artery disease     Diet Order:  Diet NPO time specified    Current Nutrition: Pt currently NPO  Food/Nutrition-Related History: Pt reports eating 3 meals per day with a good appetite but has been still noticing weight loss. Pt reports liking Chocolate or Strawberry Ensure but is too expensive to drink regularly PTA.   Medications: D5 NS at 171mL/hr (providing 450kcals in 24 hours)  Electrolyte/Renal Profile and Glucose Profile:   Recent Labs Lab 04/25/15 0903 04/25/15 2142  NA  --  137  K  --  3.9  CL  --  110  CO2  --  21*  BUN 24* 24*  CREATININE 0.92 1.00  CALCIUM  --  8.9  GLUCOSE  --  120*   Protein Profile: No results for input(s): ALBUMIN in the last 168 hours.  Gastrointestinal Profile: Last BM:  04/25/2015   Nutrition-Focused Physical Exam Findings: Nutrition-Focused physical exam completed. Findings are WDL for fat depletion, muscle depletion, and edema.    Weight Change: Pt reports weight of 142lbs in May 2016 (4% weight loss in 3  months).  RD weighed pt on visit of 137.1lbs. Per [facility] pt with weight gain since May 2016.  Skin:  Reviewed, no issues  Height:   Ht Readings from Last 1 Encounters:  04/25/15 5\' 4"  (1.626 m)    Weight:   Wt Readings from Last 1 Encounters:  04/26/15 136 lb (61.689 kg)    Wt Readings from Last 10 Encounters:  04/26/15 136 lb (61.689 kg)  04/12/15 126 lb (57.153 kg)  02/09/15 127 lb (57.607 kg)  01/22/15 126 lb 4 oz (57.267 kg)    BMI:  Body mass index is 23.33 kg/(m^2).  Estimated Nutritional Needs:   Kcal:  1683-1989kcals, BEE: 1177kcals, TEE: (IF 1.1-1.3)(AF 1.3)   Protein:  49-62g protein (0.8-1.0g/kg)  Fluid:  1543-1872mL of fluid (25-81mL/kg)  EDUCATION NEEDS:   No education needs identified at this time    Taylor, RD, LDN Pager [phone]

## 2015-04-27 ENCOUNTER — Encounter: Payer: Self-pay | Admitting: Vascular Surgery

## 2015-04-27 LAB — BASIC METABOLIC PANEL
ANION GAP: 6 (ref 5–15)
BUN: 9 mg/dL (ref 6–20)
CALCIUM: 8.5 mg/dL — AB (ref 8.9–10.3)
CO2: 21 mmol/L — AB (ref 22–32)
Chloride: 110 mmol/L (ref 101–111)
Creatinine, Ser: 0.79 mg/dL (ref 0.44–1.00)
GFR calc Af Amer: >60 mL/min (ref >60)
GLUCOSE: 143 mg/dL — AB (ref 65–99)
POTASSIUM: 3.8 mmol/L (ref 3.5–5.1)
Sodium: 137 mmol/L (ref 135–145)

## 2015-04-27 LAB — CBC
HCT: 28.3 % — ABNORMAL LOW (ref 35.0–47.0)
Hemoglobin: 9.4 g/dL — ABNORMAL LOW (ref 12.0–16.0)
MCH: 31.5 pg (ref 26.0–34.0)
MCHC: 33.3 g/dL (ref 32.0–36.0)
MCV: 94.8 fL (ref 80.0–100.0)
PLATELETS: 101 10*3/uL — AB (ref 150–440)
RBC: 2.98 MIL/uL — AB (ref 3.80–5.20)
RDW: 15.9 % — AB (ref 11.5–14.5)
WBC: 7.1 10*3/uL (ref 3.6–11.0)

## 2015-04-27 LAB — HEPARIN LEVEL (UNFRACTIONATED)
HEPARIN UNFRACTIONATED: 0.87 [IU]/mL — AB (ref 0.30–0.70)
Heparin Unfractionated: 1.16 IU/mL — ABNORMAL HIGH (ref 0.30–0.70)

## 2015-04-27 MED ORDER — GABAPENTIN 300 MG PO CAPS
300.0000 mg | ORAL_CAPSULE | Freq: Three times a day (TID) | ORAL | Status: DC | PRN
  Administered 2015-04-27 – 2015-04-29 (×4): 300 mg via ORAL
  Filled 2015-04-27 (×4): qty 1

## 2015-04-27 MED ORDER — APIXABAN 5 MG PO TABS
2.5000 mg | ORAL_TABLET | Freq: Two times a day (BID) | ORAL | Status: DC
  Administered 2015-04-27 – 2015-04-29 (×5): 2.5 mg via ORAL
  Filled 2015-04-27 (×5): qty 1

## 2015-04-27 MED FILL — Heparin Sodium (Porcine) Inj 5000 Unit/ML: INTRAMUSCULAR | Qty: 1 | Status: AC

## 2015-04-27 MED FILL — Sodium Chloride IV Soln 0.9%: INTRAVENOUS | Qty: 500 | Status: AC

## 2015-04-27 NOTE — Progress Notes (Signed)
ANTICOAGULATION CONSULT NOTE - Initial Consult  Pharmacy Consult for Heparin  Indication: arterial thrombus per vascular  Allergies  Allergen Reactions  . Aspirin Nausea And Vomiting and Other (See Comments)    Pt states aspirin makes her cramp and have to use coated kind.    Patient Measurements: Height: 5\' 4"  (162.6 cm) Weight: 136 lb (61.689 kg) IBW/kg (Calculated) : 54.7 Heparin Dosing Weight: 57.2 kg  Vital Signs: Temp: 97.4 F (36.3 C) (08/23 2317) Temp Source: Oral (08/23 2317) BP: 137/66 mmHg (08/23 2317) Pulse Rate: 51 (08/23 2317)  Labs:  Recent Labs  04/25/15 0903  04/25/15 1501 04/25/15 2142 04/26/15 0416 04/27/15 0529  HGB  --   < > 12.3  --  10.7* 9.4*  HCT  --   --  36.6  --  32.7* 28.3*  PLT  --   --  146*  --  130* 101*  APTT  --   --   --  73*  --   --   LABPROT  --   --   --  12.8  --   --   INR  --   --   --  0.94  --   --   HEPARINUNFRC  --   --   --  0.36 0.40 0.87*  CREATININE 0.92  --   --  1.00  --   --   < > = values in this interval not displayed.  Estimated Creatinine Clearance: 52.3 mL/min (by C-G formula based on Cr of 1).   Medical History: Past Medical History  Diagnosis Date  . 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negative cardiac cath  . COPD (chronic obstructive pulmonary disease)   . Hypertension   . Peripheral vascular disease   . Nicotine addiction   . Asthma   . Hemorrhoids   . Arthritis   . Coronary artery disease     Medications:  Scheduled:  . aspirin EC  325 mg Oral Daily  . atorvastatin  80 mg Oral [id number]  . docusate sodium  100 mg Oral BID  . feeding supplement (ENSURE ENLIVE)  237 mL Oral TID WC  . ferrous sulfate  325 mg Oral Daily  . hydrocortisone  1 application Rectal BID  . losartan  50 mg Oral Daily  . metoprolol  100 mg Oral Daily  . pantoprazole  40 mg Oral Daily    Assessment: Patient being treated for arterial thrombus per vascular surgery. Patient not on any anticoagulants based on PTA med rec. MD  would like 2500 units heparin bolus then start heparin gtt per protocol. 2500 units bolus is less than what would normally be dosed per protocol. ~43.7 units/kg for bolus compared to 50-70 units/kg which is the normal for  Indication   Goal of Therapy:  Heparin level 0.3-0.7 units/ml Monitor platelets by anticoagulation protocol: Yes   Plan:  Give 2500 units bolus x 1 Start heparin infusion at 1000 units/hr Check anti-Xa level in 6 hours and daily while on heparin Continue to monitor H&H and platelets   8/22 22:00 anti-Xa 0.36. Recheck in 6 hours to confirm  8/23 04:00 anti-Xa 0.40. Recheck with CBC in AM.  8/24 AM anti-Xa 0.87. Decrease to 900 units/hr and recheck in 6 hours.   Sim Boast, PharmD, BCPS  04/27/2015

## 2015-04-27 NOTE — Progress Notes (Signed)
[facility]  Daily Progress Note   Subjective  - 1 Day Post-Op  Bleeding from lost teeth.  She tells me she had two teeth knocked out with intubation.   Foot feeling better   Objective Filed Vitals:   04/26/15 2200 04/26/15 2221 04/26/15 2317 04/27/15 0743  BP: 118/65 95/73 137/66 108/66  Pulse: 58 55 51 57  Temp:  97.6 F (36.4 C) 97.4 F (36.3 C) 97.2 F (36.2 C)  TempSrc:  Oral Oral Axillary  Resp: 15 18 17 18   Height:      Weight:      SpO2: 100% 96% 98% 100%    Intake/Output Summary (Last 24 hours) at 04/27/15 0914 Last data filed at 04/27/15 0743  Gross per 24 hour  Intake 3505.83 ml  Output   1815 ml  Net 1690.83 ml    PULM  CTAB CV  RRR VASC  Good pedal pulse, incision C/D/I  Laboratory CBC    Component Value Date/Time   WBC 7.1 04/27/2015 0529   WBC 5.1 10/15/2013 1123   HGB 9.4* 04/27/2015 0529   HGB 12.1 10/15/2013 1123   HCT 28.3* 04/27/2015 0529   HCT 36.7 10/15/2013 1123   PLT 101* 04/27/2015 0529   PLT 153 10/15/2013 1123    BMET    Component Value Date/Time   NA 137 04/27/2015 0529   NA 131* 10/15/2013 1123   K 3.8 04/27/2015 0529   K 3.6 10/15/2013 1123   CL 110 04/27/2015 0529   CL 102 10/15/2013 1123   CO2 21* 04/27/2015 0529   CO2 24 10/15/2013 1123   GLUCOSE 143* 04/27/2015 0529   GLUCOSE 111* 10/15/2013 1123   BUN 9 04/27/2015 0529   BUN 12 10/15/2013 1123   CREATININE 0.79 04/27/2015 0529   CREATININE 1.20 10/15/2013 1123   CALCIUM 8.5* 04/27/2015 0529   CALCIUM 9.1 10/15/2013 1123   GFRNONAA >60 04/27/2015 0529   GFRNONAA 50* 10/15/2013 1123   GFRNONAA 36* 12/12/2011 1915   GFRAA >60 04/27/2015 0529   GFRAA 58* 10/15/2013 1123   GFRAA 44* 12/12/2011 1915    Assessment/Planning: POD #1 s/p left femoral endarterectomy and POD #2 from percutaneous infrainguinal revascularization   Foot warm, good pulse  Have contacted anesthesia about her teeth that were knocked out with  intubation  Increase activity  Get foley out  Change to Eliquis from heparin  Maybe home tomorrow    DEW,JASON  04/27/2015, 9:14 AM

## 2015-04-27 NOTE — Care Management Important Message (Signed)
Important Message  Patient Details  Name: [name] MRN: [id number] Date of Birth: 1956/05/26   Medicare Important Message Given:  Yes-second notification given    Juliann Pulse A Allmond 04/27/2015, 11:00 AM

## 2015-04-28 LAB — SURGICAL PATHOLOGY

## 2015-04-28 MED ORDER — PREGABALIN 75 MG PO CAPS
75.0000 mg | ORAL_CAPSULE | Freq: Two times a day (BID) | ORAL | Status: DC
  Administered 2015-04-28 – 2015-04-29 (×3): 75 mg via ORAL
  Filled 2015-04-28 (×3): qty 1

## 2015-04-28 NOTE — Progress Notes (Signed)
 [facility]  Daily Progress Note   Subjective  - 2 Days Post-Op  Patient feels that left leg is weak and she has been unable to walk well. She has lots of pins and needle sensation starting in the groin and radiating distally. She can move the foot reasonably well. No signs of bleeding. Bleeding from the mouth from the lost teeth has resolved. Has tolerated Eliquis so for and is off of heparin  Objective Filed Vitals:   04/27/15 1636 04/28/15 0015 04/28/15 0642 04/28/15 0818  BP: 118/50 104/46  116/60  Pulse: 56 64  68  Temp: 98.2 F (36.8 C) 97.7 F (36.5 C)  97.7 F (36.5 C)  TempSrc: Oral Oral  Oral  Resp: 17   16  Height:      Weight:   63.277 kg (139 lb 8 oz)   SpO2: 100% 100%  100%    Intake/Output Summary (Last 24 hours) at 04/28/15 1035 Last data filed at 04/28/15 0847  Gross per 24 hour  Intake    960 ml  Output   1200 ml  Net   -240 ml    PULM  CTAB CV  RRR VASC  2+ posterior tibial pulse on the left. Foot and leg are warm to hot to touch. Incision is clean, dry, and intact.  Laboratory CBC    Component Value Date/Time   WBC 7.1 04/27/2015 0529   WBC 5.1 10/15/2013 1123   HGB 9.4* 04/27/2015 0529   HGB 12.1 10/15/2013 1123   HCT 28.3* 04/27/2015 0529   HCT 36.7 10/15/2013 1123   PLT 101* 04/27/2015 0529   PLT 153 10/15/2013 1123    BMET    Component Value Date/Time   NA 137 04/27/2015 0529   NA 131* 10/15/2013 1123   K 3.8 04/27/2015 0529   K 3.6 10/15/2013 1123   CL 110 04/27/2015 0529   CL 102 10/15/2013 1123   CO2 21* 04/27/2015 0529   CO2 24 10/15/2013 1123   GLUCOSE 143* 04/27/2015 0529   GLUCOSE 111* 10/15/2013 1123   BUN 9 04/27/2015 0529   BUN 12 10/15/2013 1123   CREATININE 0.79 04/27/2015 0529   CREATININE 1.20 10/15/2013 1123   CALCIUM 8.5* 04/27/2015 0529   CALCIUM 9.1 10/15/2013 1123   GFRNONAA >60 04/27/2015 0529   GFRNONAA 50* 10/15/2013 1123   GFRNONAA 36* 12/12/2011 1915   GFRAA >60 04/27/2015  0529   GFRAA 58* 10/15/2013 1123   GFRAA 44* 12/12/2011 1915    Assessment/Planning: POD #2 s/p left femoral thromboendarterectomy and postop day 3 after percutaneous revascularization   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 Will add Lyrica to her regimen to see if this will help.  We will ask PT to help to try to improve mobility. Currently not able to go home as she is not able to stand or walk at this point. Hopefully this will improve over the next day or 2.  Continue anticoagulation due to the extensive nature of the revascularization and thrombus that was removed.  DEW,JASON  04/28/2015, 10:35 AM

## 2015-04-28 NOTE — Anesthesia Postprocedure Evaluation (Signed)
  Anesthesia Post-op Note  Patient: [name]  Procedure(s) Performed: Procedure(s): THROMBECTOMY FEMORAL ARTERY (Left) ENDARTERECTOMY FEMORAL (Left)  Anesthesia type:General  Patient location: PACU  Post pain: Pain level controlled  Post assessment: Post-op Vital signs reviewed, Patient's Cardiovascular Status Stable, Respiratory Function Stable, Patent Airway and No signs of Nausea or vomiting  Post vital signs: Reviewed and stable  Last Vitals:  Filed Vitals:   04/28/15 0818  BP: 116/60  Pulse: 68  Temp: 36.5 C  Resp: 16    Level of consciousness: awake, alert  and patient cooperative  Complications: No apparent anesthesia complications

## 2015-04-28 NOTE — Plan of Care (Signed)
Problem: Phase I Progression Outcomes Goal: Other Phase I Outcomes/Goals Outcome: Not Applicable Date Met:  72/53/66 No additional Phase Outcome/Goals identified at this time.     Problem: Phase II Progression Outcomes Goal: Other Phase II Outcomes/Goals Outcome: Not Applicable Date Met:  44/03/47 No additional Phase Outcome/Goals identified at this time.     Problem: Phase III Progression Outcomes Goal: Other Phase III Outcomes/Goals Outcome: Not Applicable Date Met:  42/59/56 No additional Phase Outcome/Goals identified at this time.

## 2015-04-28 NOTE — Progress Notes (Signed)
PT Cancellation Note  Patient Details Name: [name] MRN: [id number] DOB: May 16, 1956   Cancelled Treatment:    Reason Eval/Treat Not Completed: Patient at procedure or test/unavailable;Other (comment) (OR now).  Check later if time allows, and if not tomorrow.   Ramond Dial 04/28/2015, 11:57 AM  Mee Hives, PT MS Acute Rehab Dept. Number: [facility] [id number] and [place] [phone]

## 2015-04-29 MED ORDER — HYDROCODONE-ACETAMINOPHEN 5-325 MG PO TABS: 1.0000 | ORAL_TABLET | ORAL | Status: DC | PRN

## 2015-04-29 MED ORDER — ASPIRIN 81 MG PO TBEC: 81.0000 mg | DELAYED_RELEASE_TABLET | Freq: Every day | ORAL | Status: DC

## 2015-04-29 MED ORDER — ASPIRIN EC 81 MG PO TBEC
81.0000 mg | DELAYED_RELEASE_TABLET | Freq: Every day | ORAL | Status: DC
  Administered 2015-04-29: 81 mg via ORAL
  Filled 2015-04-29: qty 1

## 2015-04-29 MED ORDER — GABAPENTIN 300 MG PO CAPS: 300.0000 mg | ORAL_CAPSULE | Freq: Three times a day (TID) | ORAL | Status: DC | PRN

## 2015-04-29 MED ORDER — PREGABALIN 75 MG PO CAPS: 75.0000 mg | ORAL_CAPSULE | Freq: Two times a day (BID) | ORAL | Status: DC

## 2015-04-29 MED ORDER — APIXABAN 2.5 MG PO TABS: 2.5000 mg | ORAL_TABLET | Freq: Two times a day (BID) | ORAL | Status: DC

## 2015-04-29 NOTE — Discharge Summary (Signed)
[name]    Discharge Summary    Patient ID:  [name] MRN: [id number] DOB/AGE: [birth year] 59 y.o.  Admit date: 04/25/2015 Discharge date: 04/29/2015 Date of Surgery: 04/25/2015 - 04/26/2015 Surgeon: Dr. Leotis Pain Admission Diagnosis: LT lower extrem angio   ASO w claudication ischemic leg  Discharge Diagnoses:  LT lower extrem angio   ASO w claudication ischemic leg  Secondary Diagnoses: Past Medical History  Diagnosis Date  . 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negative cardiac cath  . COPD (chronic obstructive pulmonary disease)   . Hypertension   . Peripheral vascular disease   . Nicotine addiction   . Asthma   . Hemorrhoids   . Arthritis   . Coronary artery disease     Procedure(s): THROMBECTOMY FEMORAL ARTERY ENDARTERECTOMY FEMORAL   Discharged Condition: good  HPI:  Patient presented with acute on chronic LLE ischemia. Brought in for revascularization  Hospital Course:  [name] is a 59 y.o. female is S/P left Procedure(s): THROMBECTOMY FEMORAL ARTERY ENDARTERECTOMY FEMORAL Extubated:in OR Physical exam: 2+ left PT pulse, incision C/D/I Post-op wounds healing well Pt. Ambulating, voiding and taking PO diet without difficulty. Pt pain controlled with PO pain meds. Labs as below Complications:none  Consults:     Significant Diagnostic Studies: CBC Lab Results  Component Value Date   WBC 7.1 04/27/2015   HGB 9.4* 04/27/2015   HCT 28.3* 04/27/2015   MCV 94.8 04/27/2015   PLT 101* 04/27/2015    BMET    Component Value Date/Time   NA 137 04/27/2015 0529   NA 131* 10/15/2013 1123   K 3.8 04/27/2015 0529   K 3.6 10/15/2013 1123   CL 110 04/27/2015 0529   CL 102 10/15/2013 1123   CO2 21* 04/27/2015 0529   CO2 24 10/15/2013 1123   GLUCOSE 143* 04/27/2015 0529   GLUCOSE 111* 10/15/2013 1123   BUN 9 04/27/2015 0529   BUN 12 10/15/2013 1123   CREATININE 0.79 04/27/2015 0529   CREATININE 1.20 10/15/2013 1123   CALCIUM 8.5* 04/27/2015 0529   CALCIUM 9.1 10/15/2013 1123   GFRNONAA >60 04/27/2015 0529   GFRNONAA 50* 10/15/2013 1123   GFRNONAA 36* 12/12/2011 1915   GFRAA >60 04/27/2015 0529   GFRAA 58* 10/15/2013 1123   GFRAA 44* 12/12/2011 1915   COAG Lab Results  Component Value Date   INR 0.94 04/25/2015     Disposition:  Discharge to :home     Medication List    STOP taking these medications        ciprofloxacin 500 MG tablet  Commonly known as:  CIPRO     clindamycin 300 MG capsule  Commonly known as:  CLEOCIN      TAKE these medications        apixaban 2.5 MG Tabs tablet  Commonly known as:  ELIQUIS  Take 1 tablet (2.5 mg total) by mouth 2 (two) times daily.     aspirin 81 MG EC tablet  Take 1 tablet (81 mg total) by mouth daily.     atorvastatin 80 MG tablet  Commonly known as:  LIPITOR  Take 80 mg by mouth every morning.     cetirizine 10 MG tablet  Commonly known as:  ZYRTEC  Take 10 mg by mouth daily.     feeding supplement (ENSURE ENLIVE) Liqd  Take 237 mLs by mouth 3 (three) times daily with meals.     ferrous sulfate 325 (65 FE) MG tablet  Take 325 mg  by mouth daily.     gabapentin 300 MG capsule  Commonly known as:  NEURONTIN  Take 1 capsule (300 mg total) by mouth 3 (three) times daily as needed (Nerve Pain).     HYDROcodone-acetaminophen 5-325 MG per tablet  Commonly known as:  NORCO/VICODIN  Take 1-2 tablets by mouth every 4 (four) hours as needed for moderate pain.     hydrocortisone 2.5 % rectal cream  Commonly known as:  PROCTOSOL HC  Place 1 application rectally 2 (two) times daily.     losartan 50 MG tablet  Commonly known as:  COZAAR  Take 50 mg by mouth daily.     metoprolol 50 MG tablet  Commonly known as:  LOPRESSOR  Take 100 mg by mouth daily.     pregabalin 75 MG capsule  Commonly known as:  LYRICA  Take 1 capsule (75 mg total) by mouth 2 (two) times daily.       Verbal and written Discharge instructions given to the  patient. Wound care per Discharge AVS     Follow-up Information    Follow up with [facility] A STEGMAYER, PA-C In 2 weeks.   Specialty:  Physician Assistant   Why:  with ABIs, For wound re-check   Contact information:   [place] [address] [phone]       Signed: Leotis Pain, MD  04/29/2015, 8:36 AM

## 2015-04-29 NOTE — Care Management Important Message (Signed)
Important Message  Patient Details  Name: [name] MRN: [id number] Date of Birth: 01/16/1956   Medicare Important Message Given:  Yes-third notification given    Juliann Pulse A Allmond 04/29/2015, 9:15 AM

## 2015-04-29 NOTE — Discharge Instructions (Signed)

## 2015-04-29 NOTE — Care Management Note (Signed)
Case Management Note  Patient Details  Name: [name] MRN: [id number] Date of Birth: 1956/04/02  Subjective/Objective:  Case discussed with Dr. Lucky Cowboy. Met with patient. Presented a list of home health providers to choose who will provide PT services. [facility] chooses [facility]. Ordered front wheel walker from [facility] and PT services. Dr. Lucky Cowboy did not feel any additional services were indicated. Patient lives at home with her boyfriend who is very supportive. PCP: Leta [facility]. Denies issues obtaining meds, copays, transporation or shelter                  Action/Plan: Home with PT  Expected Discharge Date:   04/29/2015               Expected Discharge Plan:  [place]  In-House Referral:     Discharge planning Services  CM Consult  Post Acute Care Choice:  Durable Medical Equipment, Home Health Choice offered to:  Patient  DME Arranged:  Walker rolling DME Agency:  [place]:  PT [facility] Agency:  [place]  Status of Service:  Completed, signed off  Medicare Important Message Given:  Yes-second notification given Date Medicare IM Given:    Medicare IM give by:    Date Additional Medicare IM Given:    Additional Medicare Important Message give by:     If discussed at [place] of Stay Meetings, dates discussed:    Additional Comments:  Jolly Mango, RN 04/29/2015, 8:49 AM

## 2015-04-29 NOTE — Evaluation (Signed)
Physical Therapy Evaluation Patient Details Name: [name] MRN: [id number] DOB: [birth year] Today's Date: 04/29/2015   History of Present Illness  Pt is a 59 y.o. female s/p 04/25/15 percutaneous infrainguinal revascularization and s/p 04/26/15 L femoral thromboendarterectomy.  Clinical Impression  Currently pt demonstrates impairments with L hip strength, balance, and limitations with functional mobility.  Prior to admission, pt was independent without AD.  Currently pt is supervision with functional mobility using RW (pt unsteady and with difficulty ambulating without RW).  Pt would benefit from skilled PT to address above noted impairments and functional limitations.  Recommend pt discharge to home with HHPT when medically appropriate.  No POC initiated d/t pt already discharged home at time of this note.     Follow Up Recommendations Home health PT    Equipment Recommendations  Rolling walker with 5" wheels    Recommendations for Other Services       Precautions / Restrictions Precautions Precautions: Fall Restrictions Weight Bearing Restrictions: No      Mobility  Bed Mobility Overal bed mobility: Independent                Transfers Overall transfer level: Modified independent Equipment used: Rolling walker (2 wheeled)                Ambulation/Gait Ambulation/Gait assistance: Supervision;Min guard Ambulation Distance (Feet): 120 Feet Assistive device: Rolling walker (2 wheeled) Gait Pattern/deviations: Step-through pattern;Decreased stance time - left;Decreased step length - right;Antalgic     General Gait Details: pt initially CGA but after vc's for gait pattern and walker use, pt then supervision (pt steady without loss of balance and demonstrated safe use of RW)  Stairs            Wheelchair Mobility    Modified Rankin (Stroke Patients Only)       Balance Overall balance assessment: No apparent balance deficits (not formally  assessed) (with use of RW)                                           Pertinent Vitals/Pain Pain Assessment: 0-10 Pain Score: 3  Pain Location: L groin Pain Descriptors / Indicators: Burning Pain Intervention(s): Limited activity within patient's tolerance;Monitored during session;Repositioned  See flowsheet for details of HR and O2 vitals.    Home Living Family/patient expects to be discharged to:: Private residence Living Arrangements: Spouse/significant other (boyfriend) Available Help at Discharge:  (boyfriend)   Home Access: Level entry     Home Layout: One level Home Equipment: None      Prior Function Level of Independence: Independent               Hand Dominance        Extremity/Trunk Assessment   Upper Extremity Assessment: Overall WFL for tasks assessed           Lower Extremity Assessment: RLE deficits/detail;LLE deficits/detail RLE Deficits / Details: ROM and strength WFL LLE Deficits / Details: L LE ROM WFL; L hip flexion 3-/5; L knee extension, flexion, and DF at least 4/5     Communication   Communication: No difficulties  Cognition Arousal/Alertness: Awake/alert Behavior During Therapy: WFL for tasks assessed/performed Overall Cognitive Status: Within Functional Limits for tasks assessed                      General Comments   Nursing cleared pt  for participation in physical therapy.  Pt agreeable to PT session. Pt's boyfriend present during session.    Exercises    Pt and pt's boyfriend educated on how to appropriately size walker to pt (change height of walker to pt's height); both verbalizing good understanding.      Assessment/Plan    PT Assessment Patient needs continued PT services  PT Diagnosis Acute pain;Difficulty walking   PT Problem List Decreased strength;Decreased balance;Decreased mobility;Decreased knowledge of use of DME;Pain  PT Treatment Interventions DME instruction;Gait  training;Functional mobility training;Therapeutic activities;Therapeutic exercise;Balance training;Patient/family education   PT Goals (Current goals can be found in the Care Plan section) Acute Rehab PT Goals Patient Stated Goal: To go home PT Goal Formulation: With patient Time For Goal Achievement: 05/13/15 Potential to Achieve Goals: Good    Frequency Min 2X/week   Barriers to discharge        Co-evaluation               End of Session Equipment Utilized During Treatment: Gait belt Activity Tolerance: Patient tolerated treatment well Patient left: in bed;with call bell/phone within reach;with bed alarm set;with family/visitor present (L LE elevated on pillow)           Time: 9150-5697 PT Time Calculation (min) (ACUTE ONLY): 12 min   Charges:   PT Evaluation $Initial PT Evaluation Tier I: 1 Procedure     PT G CodesLeitha Reilly May 23, 2015, 1:01 PM [name], [place]

## 2015-04-29 NOTE — Progress Notes (Signed)
Pt A and O x 4. VSS. Pt tolerating diet well. Minimal complaints of pain with no meds needed. IV removed intact, prescriptions given. Pt voiced understanding of discharge instructions with no further questions. Pt given walker to be taken home and setup with home health. Pt discharged via wheelchair with axillary.

## 2015-04-29 NOTE — Progress Notes (Signed)
[facility]  Daily Progress Note   Subjective  - 3 Days Post-Op  Feeling better.  Pain better.  Walking with assistance  Objective Filed Vitals:   04/28/15 1556 04/28/15 2329 04/29/15 0500 04/29/15 0742  BP: 102/50 132/55  115/69  Pulse: 57 66  60  Temp: 99.8 F (37.7 C) 97.7 F (36.5 C)  97.9 F (36.6 C)  TempSrc: Oral   Oral  Resp: 16 20    Height:      Weight:   66.395 kg (146 lb 6 oz)   SpO2: 93% 100%  100%    Intake/Output Summary (Last 24 hours) at 04/29/15 0829 Last data filed at 04/29/15 0720  Gross per 24 hour  Intake   1560 ml  Output   2200 ml  Net   -640 ml    PULM  CTAB CV  RRR VASC  Incision C/D/I.  2+ left PT pulse is persent  Laboratory CBC    Component Value Date/Time   WBC 7.1 04/27/2015 0529   WBC 5.1 10/15/2013 1123   HGB 9.4* 04/27/2015 0529   HGB 12.1 10/15/2013 1123   HCT 28.3* 04/27/2015 0529   HCT 36.7 10/15/2013 1123   PLT 101* 04/27/2015 0529   PLT 153 10/15/2013 1123    BMET    Component Value Date/Time   NA 137 04/27/2015 0529   NA 131* 10/15/2013 1123   K 3.8 04/27/2015 0529   K 3.6 10/15/2013 1123   CL 110 04/27/2015 0529   CL 102 10/15/2013 1123   CO2 21* 04/27/2015 0529   CO2 24 10/15/2013 1123   GLUCOSE 143* 04/27/2015 0529   GLUCOSE 111* 10/15/2013 1123   BUN 9 04/27/2015 0529   BUN 12 10/15/2013 1123   CREATININE 0.79 04/27/2015 0529   CREATININE 1.20 10/15/2013 1123   CALCIUM 8.5* 04/27/2015 0529   CALCIUM 9.1 10/15/2013 1123   GFRNONAA >60 04/27/2015 0529   GFRNONAA 50* 10/15/2013 1123   GFRNONAA 36* 12/12/2011 1915   GFRAA >60 04/27/2015 0529   GFRAA 58* 10/15/2013 1123   GFRAA 44* 12/12/2011 1915    Assessment/Planning: POD #3 s/p left femoral embolectomy   Doing well.  Pain improved  Ambulating better, but will likely need a walker at home  Plan to discharge today    DEW,JASON  04/29/2015, 8:29 AM

## 2015-04-30 DIAGNOSIS — J449 Chronic obstructive pulmonary disease, unspecified: Secondary | ICD-10-CM | POA: Diagnosis not present

## 2015-04-30 DIAGNOSIS — I998 Other disorder of circulatory system: Secondary | ICD-10-CM | POA: Diagnosis not present

## 2015-04-30 DIAGNOSIS — I10 Essential (primary) hypertension: Secondary | ICD-10-CM | POA: Diagnosis not present

## 2015-04-30 DIAGNOSIS — I739 Peripheral vascular disease, unspecified: Secondary | ICD-10-CM | POA: Diagnosis not present

## 2015-04-30 DIAGNOSIS — Z48812 Encounter for surgical aftercare following surgery on the circulatory system: Secondary | ICD-10-CM | POA: Diagnosis not present

## 2015-04-30 DIAGNOSIS — I2510 Atherosclerotic heart disease of native coronary artery without angina pectoris: Secondary | ICD-10-CM | POA: Diagnosis not present

## 2015-04-30 DIAGNOSIS — F17200 Nicotine dependence, unspecified, uncomplicated: Secondary | ICD-10-CM | POA: Diagnosis not present

## 2015-04-30 DIAGNOSIS — J45909 Unspecified asthma, uncomplicated: Secondary | ICD-10-CM | POA: Diagnosis not present

## 2015-05-02 DIAGNOSIS — I10 Essential (primary) hypertension: Secondary | ICD-10-CM | POA: Diagnosis not present

## 2015-05-02 DIAGNOSIS — J45909 Unspecified asthma, uncomplicated: Secondary | ICD-10-CM | POA: Diagnosis not present

## 2015-05-02 DIAGNOSIS — J449 Chronic obstructive pulmonary disease, unspecified: Secondary | ICD-10-CM | POA: Diagnosis not present

## 2015-05-02 DIAGNOSIS — F17200 Nicotine dependence, unspecified, uncomplicated: Secondary | ICD-10-CM | POA: Diagnosis not present

## 2015-05-02 DIAGNOSIS — I2510 Atherosclerotic heart disease of native coronary artery without angina pectoris: Secondary | ICD-10-CM | POA: Diagnosis not present

## 2015-05-02 DIAGNOSIS — I998 Other disorder of circulatory system: Secondary | ICD-10-CM | POA: Diagnosis not present

## 2015-05-02 DIAGNOSIS — I739 Peripheral vascular disease, unspecified: Secondary | ICD-10-CM | POA: Diagnosis not present

## 2015-05-02 DIAGNOSIS — Z48812 Encounter for surgical aftercare following surgery on the circulatory system: Secondary | ICD-10-CM | POA: Diagnosis not present

## 2015-05-05 DIAGNOSIS — I10 Essential (primary) hypertension: Secondary | ICD-10-CM | POA: Diagnosis not present

## 2015-05-05 DIAGNOSIS — F17200 Nicotine dependence, unspecified, uncomplicated: Secondary | ICD-10-CM | POA: Diagnosis not present

## 2015-05-05 DIAGNOSIS — J45909 Unspecified asthma, uncomplicated: Secondary | ICD-10-CM | POA: Diagnosis not present

## 2015-05-05 DIAGNOSIS — I998 Other disorder of circulatory system: Secondary | ICD-10-CM | POA: Diagnosis not present

## 2015-05-05 DIAGNOSIS — J449 Chronic obstructive pulmonary disease, unspecified: Secondary | ICD-10-CM | POA: Diagnosis not present

## 2015-05-05 DIAGNOSIS — I2510 Atherosclerotic heart disease of native coronary artery without angina pectoris: Secondary | ICD-10-CM | POA: Diagnosis not present

## 2015-05-05 DIAGNOSIS — I739 Peripheral vascular disease, unspecified: Secondary | ICD-10-CM | POA: Diagnosis not present

## 2015-05-05 DIAGNOSIS — Z48812 Encounter for surgical aftercare following surgery on the circulatory system: Secondary | ICD-10-CM | POA: Diagnosis not present

## 2015-05-11 DIAGNOSIS — F17200 Nicotine dependence, unspecified, uncomplicated: Secondary | ICD-10-CM | POA: Diagnosis not present

## 2015-05-11 DIAGNOSIS — I2510 Atherosclerotic heart disease of native coronary artery without angina pectoris: Secondary | ICD-10-CM | POA: Diagnosis not present

## 2015-05-11 DIAGNOSIS — I739 Peripheral vascular disease, unspecified: Secondary | ICD-10-CM | POA: Diagnosis not present

## 2015-05-11 DIAGNOSIS — J449 Chronic obstructive pulmonary disease, unspecified: Secondary | ICD-10-CM | POA: Diagnosis not present

## 2015-05-11 DIAGNOSIS — I998 Other disorder of circulatory system: Secondary | ICD-10-CM | POA: Diagnosis not present

## 2015-05-11 DIAGNOSIS — J45909 Unspecified asthma, uncomplicated: Secondary | ICD-10-CM | POA: Diagnosis not present

## 2015-05-11 DIAGNOSIS — Z48812 Encounter for surgical aftercare following surgery on the circulatory system: Secondary | ICD-10-CM | POA: Diagnosis not present

## 2015-05-11 DIAGNOSIS — I10 Essential (primary) hypertension: Secondary | ICD-10-CM | POA: Diagnosis not present

## 2015-05-13 ENCOUNTER — Encounter: Payer: Self-pay | Admitting: Internal Medicine

## 2015-06-06 DIAGNOSIS — J45909 Unspecified asthma, uncomplicated: Secondary | ICD-10-CM | POA: Diagnosis not present

## 2015-06-06 DIAGNOSIS — J449 Chronic obstructive pulmonary disease, unspecified: Secondary | ICD-10-CM | POA: Diagnosis not present

## 2015-06-06 DIAGNOSIS — I998 Other disorder of circulatory system: Secondary | ICD-10-CM | POA: Diagnosis not present

## 2015-06-14 DIAGNOSIS — F17210 Nicotine dependence, cigarettes, uncomplicated: Secondary | ICD-10-CM | POA: Diagnosis not present

## 2015-06-14 DIAGNOSIS — I739 Peripheral vascular disease, unspecified: Secondary | ICD-10-CM | POA: Diagnosis not present

## 2015-06-14 DIAGNOSIS — F330 Major depressive disorder, recurrent, mild: Secondary | ICD-10-CM | POA: Diagnosis not present

## 2015-06-14 DIAGNOSIS — J449 Chronic obstructive pulmonary disease, unspecified: Secondary | ICD-10-CM | POA: Diagnosis not present

## 2015-06-14 DIAGNOSIS — Z0001 Encounter for general adult medical examination with abnormal findings: Secondary | ICD-10-CM | POA: Diagnosis not present

## 2015-06-14 DIAGNOSIS — M4306 Spondylolysis, lumbar region: Secondary | ICD-10-CM | POA: Diagnosis not present

## 2015-06-14 DIAGNOSIS — I10 Essential (primary) hypertension: Secondary | ICD-10-CM | POA: Diagnosis not present

## 2015-06-24 DIAGNOSIS — E785 Hyperlipidemia, unspecified: Secondary | ICD-10-CM | POA: Diagnosis not present

## 2015-06-24 DIAGNOSIS — I739 Peripheral vascular disease, unspecified: Secondary | ICD-10-CM | POA: Diagnosis not present

## 2015-07-04 ENCOUNTER — Encounter: Payer: Self-pay | Admitting: Vascular Surgery

## 2015-07-04 MED ORDER — IOHEXOL 300 MG/ML  SOLN
INTRAMUSCULAR | Status: DC | PRN
  Administered 2015-04-25: 120 mL via INTRAVENOUS

## 2015-09-06 ENCOUNTER — Other Ambulatory Visit: Payer: Self-pay | Admitting: Physician Assistant

## 2015-09-06 DIAGNOSIS — R911 Solitary pulmonary nodule: Secondary | ICD-10-CM

## 2015-09-22 ENCOUNTER — Ambulatory Visit
Admission: RE | Admit: 2015-09-22 | Discharge: 2015-09-22 | Disposition: A | Discharge status: Home / Self Care | Payer: Medicare Other | Source: Ambulatory Visit | Attending: Physician Assistant | Admitting: Physician Assistant

## 2015-09-22 DIAGNOSIS — K760 Fatty (change of) liver, not elsewhere classified: Secondary | ICD-10-CM | POA: Diagnosis not present

## 2015-09-22 DIAGNOSIS — R911 Solitary pulmonary nodule: Secondary | ICD-10-CM | POA: Insufficient documentation

## 2015-09-22 DIAGNOSIS — R918 Other nonspecific abnormal finding of lung field: Secondary | ICD-10-CM | POA: Insufficient documentation

## 2015-09-22 MED ORDER — IOHEXOL 300 MG/ML  SOLN
75.0000 mL | Freq: Once | INTRAMUSCULAR | Status: AC | PRN
  Administered 2015-09-22: 75 mL via INTRAVENOUS

## 2015-09-27 DIAGNOSIS — I10 Essential (primary) hypertension: Secondary | ICD-10-CM | POA: Diagnosis not present

## 2015-09-27 DIAGNOSIS — I340 Nonrheumatic mitral (valve) insufficiency: Secondary | ICD-10-CM | POA: Diagnosis not present

## 2015-09-27 DIAGNOSIS — I2510 Atherosclerotic heart disease of native coronary artery without angina pectoris: Secondary | ICD-10-CM | POA: Diagnosis not present

## 2015-09-27 DIAGNOSIS — R079 Chest pain, unspecified: Secondary | ICD-10-CM | POA: Diagnosis not present

## 2015-09-29 DIAGNOSIS — I340 Nonrheumatic mitral (valve) insufficiency: Secondary | ICD-10-CM | POA: Diagnosis not present

## 2015-09-29 DIAGNOSIS — R079 Chest pain, unspecified: Secondary | ICD-10-CM | POA: Diagnosis not present

## 2015-10-03 DIAGNOSIS — I10 Essential (primary) hypertension: Secondary | ICD-10-CM | POA: Diagnosis not present

## 2015-10-03 DIAGNOSIS — I340 Nonrheumatic mitral (valve) insufficiency: Secondary | ICD-10-CM | POA: Diagnosis not present

## 2015-10-03 DIAGNOSIS — F17210 Nicotine dependence, cigarettes, uncomplicated: Secondary | ICD-10-CM | POA: Diagnosis not present

## 2015-10-03 DIAGNOSIS — I2510 Atherosclerotic heart disease of native coronary artery without angina pectoris: Secondary | ICD-10-CM | POA: Diagnosis not present

## 2015-10-03 DIAGNOSIS — I7389 Other specified peripheral vascular diseases: Secondary | ICD-10-CM | POA: Diagnosis not present

## 2015-10-06 DIAGNOSIS — D381 Neoplasm of uncertain behavior of trachea, bronchus and lung: Secondary | ICD-10-CM | POA: Diagnosis not present

## 2015-10-06 DIAGNOSIS — J189 Pneumonia, unspecified organism: Secondary | ICD-10-CM | POA: Diagnosis not present

## 2015-10-06 DIAGNOSIS — J449 Chronic obstructive pulmonary disease, unspecified: Secondary | ICD-10-CM | POA: Diagnosis not present

## 2015-10-06 DIAGNOSIS — M4306 Spondylolysis, lumbar region: Secondary | ICD-10-CM | POA: Diagnosis not present

## 2015-10-07 ENCOUNTER — Other Ambulatory Visit: Payer: Self-pay | Admitting: Internal Medicine

## 2015-10-07 DIAGNOSIS — R911 Solitary pulmonary nodule: Secondary | ICD-10-CM

## 2015-10-12 ENCOUNTER — Other Ambulatory Visit
Admission: RE | Admit: 2015-10-12 | Discharge: 2015-10-12 | Disposition: A | Discharge status: Home / Self Care | Payer: Medicare Other | Source: Ambulatory Visit | Attending: Physician Assistant | Admitting: Physician Assistant

## 2015-10-12 DIAGNOSIS — R634 Abnormal weight loss: Secondary | ICD-10-CM | POA: Insufficient documentation

## 2015-10-12 LAB — CBC WITH DIFFERENTIAL/PLATELET
BASOS ABS: 0 10*3/uL (ref 0–0.1)
BASOS PCT: 1 %
EOS ABS: 0 10*3/uL (ref 0–0.7)
EOS PCT: 1 %
HEMATOCRIT: 40.3 % (ref 35.0–47.0)
Hemoglobin: 13.7 g/dL (ref 12.0–16.0)
Lymphocytes Relative: 35 %
Lymphs Abs: 1.6 10*3/uL (ref 1.0–3.6)
MCH: 31.2 pg (ref 26.0–34.0)
MCHC: 33.9 g/dL (ref 32.0–36.0)
MCV: 91.9 fL (ref 80.0–100.0)
MONO ABS: 0.3 10*3/uL (ref 0.2–0.9)
MONOS PCT: 7 %
NEUTROS ABS: 2.6 10*3/uL (ref 1.4–6.5)
Neutrophils Relative %: 56 %
PLATELETS: 167 10*3/uL (ref 150–440)
RBC: 4.38 MIL/uL (ref 3.80–5.20)
RDW: 15.7 % — AB (ref 11.5–14.5)
WBC: 4.6 10*3/uL (ref 3.6–11.0)

## 2015-10-12 LAB — FERRITIN: FERRITIN: 76 ng/mL (ref 11–307)

## 2015-10-12 LAB — IRON AND TIBC
Iron: 112 ug/dL (ref 28–170)
SATURATION RATIOS: 25 % (ref 10.4–31.8)
TIBC: 451 ug/dL — AB (ref 250–450)
UIBC: 339 ug/dL

## 2015-10-12 LAB — COMPREHENSIVE METABOLIC PANEL
ALBUMIN: 4.3 g/dL (ref 3.5–5.0)
ALT: 18 U/L (ref 14–54)
ANION GAP: 7 (ref 5–15)
AST: 24 U/L (ref 15–41)
Alkaline Phosphatase: 71 U/L (ref 38–126)
BILIRUBIN TOTAL: 0.4 mg/dL (ref 0.3–1.2)
BUN: 14 mg/dL (ref 6–20)
CHLORIDE: 107 mmol/L (ref 101–111)
CO2: 24 mmol/L (ref 22–32)
Calcium: 9.9 mg/dL (ref 8.9–10.3)
Creatinine, Ser: 0.87 mg/dL (ref 0.44–1.00)
GFR calc Af Amer: >60 mL/min (ref >60)
GFR calc non Af Amer: >60 mL/min (ref >60)
GLUCOSE: 94 mg/dL (ref 65–99)
POTASSIUM: 4.5 mmol/L (ref 3.5–5.1)
Sodium: 138 mmol/L (ref 135–145)
TOTAL PROTEIN: 7.9 g/dL (ref 6.5–8.1)

## 2015-10-12 LAB — LIPID PANEL
CHOLESTEROL: 190 mg/dL (ref 0–200)
HDL: 57 mg/dL (ref >40)
LDL CALC: 115 mg/dL — AB (ref 0–99)
Total CHOL/HDL Ratio: 3.3 RATIO
Triglycerides: 89 mg/dL (ref <150)
VLDL: 18 mg/dL (ref 0–40)

## 2015-10-12 LAB — TSH: TSH: 1.086 u[IU]/mL (ref 0.350–4.500)

## 2015-10-13 ENCOUNTER — Ambulatory Visit: Payer: Medicare Other

## 2015-10-13 LAB — T4: T4 TOTAL: 6.1 ug/dL (ref 4.5–12.0)

## 2015-10-14 ENCOUNTER — Ambulatory Visit
Admission: RE | Admit: 2015-10-14 | Discharge: 2015-10-14 | Disposition: A | Discharge status: Home / Self Care | Payer: Medicare Other | Source: Ambulatory Visit | Attending: Internal Medicine | Admitting: Internal Medicine

## 2015-10-14 DIAGNOSIS — R911 Solitary pulmonary nodule: Secondary | ICD-10-CM | POA: Diagnosis present

## 2015-10-14 DIAGNOSIS — R918 Other nonspecific abnormal finding of lung field: Secondary | ICD-10-CM | POA: Insufficient documentation

## 2015-10-14 LAB — GLUCOSE, CAPILLARY: GLUCOSE-CAPILLARY: 70 mg/dL (ref 65–99)

## 2015-10-14 MED ORDER — FLUDEOXYGLUCOSE F - 18 (FDG) INJECTION
12.0000 | Freq: Once | INTRAVENOUS | Status: AC | PRN
  Administered 2015-10-14: 12 via INTRAVENOUS

## 2015-10-17 DIAGNOSIS — J439 Emphysema, unspecified: Secondary | ICD-10-CM | POA: Diagnosis not present

## 2015-10-17 DIAGNOSIS — D381 Neoplasm of uncertain behavior of trachea, bronchus and lung: Secondary | ICD-10-CM | POA: Diagnosis not present

## 2015-10-17 DIAGNOSIS — J449 Chronic obstructive pulmonary disease, unspecified: Secondary | ICD-10-CM | POA: Diagnosis not present

## 2015-12-27 ENCOUNTER — Other Ambulatory Visit: Payer: Self-pay | Admitting: Vascular Surgery

## 2015-12-27 DIAGNOSIS — E785 Hyperlipidemia, unspecified: Secondary | ICD-10-CM | POA: Diagnosis not present

## 2015-12-27 DIAGNOSIS — I70222 Atherosclerosis of native arteries of extremities with rest pain, left leg: Secondary | ICD-10-CM | POA: Diagnosis not present

## 2015-12-27 DIAGNOSIS — I10 Essential (primary) hypertension: Secondary | ICD-10-CM | POA: Diagnosis not present

## 2015-12-27 DIAGNOSIS — F17200 Nicotine dependence, unspecified, uncomplicated: Secondary | ICD-10-CM | POA: Diagnosis not present

## 2015-12-27 DIAGNOSIS — I739 Peripheral vascular disease, unspecified: Secondary | ICD-10-CM | POA: Diagnosis not present

## 2015-12-30 ENCOUNTER — Other Ambulatory Visit
Admission: RE | Admit: 2015-12-30 | Discharge: 2015-12-30 | Disposition: A | Discharge status: Home / Self Care | Payer: Medicare Other | Source: Intra-hospital | Attending: Vascular Surgery | Admitting: Vascular Surgery

## 2015-12-30 DIAGNOSIS — I70209 Unspecified atherosclerosis of native arteries of extremities, unspecified extremity: Secondary | ICD-10-CM | POA: Diagnosis not present

## 2015-12-30 LAB — CREATININE, SERUM
CREATININE: 0.93 mg/dL (ref 0.44–1.00)
GFR calc non Af Amer: >60 mL/min (ref >60)

## 2015-12-30 LAB — BUN: BUN: 12 mg/dL (ref 6–20)

## 2016-01-02 ENCOUNTER — Ambulatory Visit
Admission: RE | Admit: 2016-01-02 | Discharge: 2016-01-02 | Disposition: A | Discharge status: Home / Self Care | Payer: Medicare Other | Source: Ambulatory Visit | Attending: Vascular Surgery | Admitting: Vascular Surgery

## 2016-01-02 ENCOUNTER — Encounter: Payer: Self-pay | Admitting: *Deleted

## 2016-01-02 ENCOUNTER — Encounter
Admission: RE | Disposition: A | Discharge status: Home / Self Care | Payer: Self-pay | Source: Ambulatory Visit | Attending: Vascular Surgery

## 2016-01-02 DIAGNOSIS — I70219 Atherosclerosis of native arteries of extremities with intermittent claudication, unspecified extremity: Secondary | ICD-10-CM | POA: Diagnosis not present

## 2016-01-02 DIAGNOSIS — Z90710 Acquired absence of both cervix and uterus: Secondary | ICD-10-CM | POA: Insufficient documentation

## 2016-01-02 DIAGNOSIS — I519 Heart disease, unspecified: Secondary | ICD-10-CM | POA: Diagnosis not present

## 2016-01-02 DIAGNOSIS — I70212 Atherosclerosis of native arteries of extremities with intermittent claudication, left leg: Secondary | ICD-10-CM | POA: Diagnosis not present

## 2016-01-02 DIAGNOSIS — I499 Cardiac arrhythmia, unspecified: Secondary | ICD-10-CM | POA: Diagnosis not present

## 2016-01-02 DIAGNOSIS — Z79899 Other long term (current) drug therapy: Secondary | ICD-10-CM | POA: Diagnosis not present

## 2016-01-02 DIAGNOSIS — I739 Peripheral vascular disease, unspecified: Secondary | ICD-10-CM | POA: Diagnosis not present

## 2016-01-02 DIAGNOSIS — I119 Hypertensive heart disease without heart failure: Secondary | ICD-10-CM | POA: Insufficient documentation

## 2016-01-02 DIAGNOSIS — I771 Stricture of artery: Secondary | ICD-10-CM | POA: Diagnosis not present

## 2016-01-02 DIAGNOSIS — Z8249 Family history of ischemic heart disease and other diseases of the circulatory system: Secondary | ICD-10-CM | POA: Insufficient documentation

## 2016-01-02 DIAGNOSIS — Z7982 Long term (current) use of aspirin: Secondary | ICD-10-CM | POA: Insufficient documentation

## 2016-01-02 DIAGNOSIS — F17200 Nicotine dependence, unspecified, uncomplicated: Secondary | ICD-10-CM | POA: Insufficient documentation

## 2016-01-02 DIAGNOSIS — Z886 Allergy status to analgesic agent status: Secondary | ICD-10-CM | POA: Diagnosis not present

## 2016-01-02 DIAGNOSIS — I6529 Occlusion and stenosis of unspecified carotid artery: Secondary | ICD-10-CM | POA: Diagnosis not present

## 2016-01-02 DIAGNOSIS — Z9049 Acquired absence of other specified parts of digestive tract: Secondary | ICD-10-CM | POA: Diagnosis not present

## 2016-01-02 DIAGNOSIS — E785 Hyperlipidemia, unspecified: Secondary | ICD-10-CM | POA: Diagnosis not present

## 2016-01-02 HISTORY — PX: PERIPHERAL VASCULAR CATHETERIZATION: SHX172C

## 2016-01-02 HISTORY — DX: Reserved for inherently not codable concepts without codable children: IMO0001

## 2016-01-02 HISTORY — DX: Pneumonia, unspecified organism: J18.9

## 2016-01-02 HISTORY — DX: Occlusion and stenosis of unspecified carotid artery: I65.29

## 2016-01-02 HISTORY — DX: Hyperlipidemia, unspecified: E78.5

## 2016-01-02 SURGERY — LOWER EXTREMITY ANGIOGRAPHY
Anesthesia: Moderate Sedation | Laterality: Left

## 2016-01-02 MED ORDER — HEPARIN SODIUM (PORCINE) 1000 UNIT/ML IJ SOLN
INTRAMUSCULAR | Status: DC | PRN
  Administered 2016-01-02: 4000 [IU] via INTRAVENOUS

## 2016-01-02 MED ORDER — FENTANYL CITRATE (PF) 100 MCG/2ML IJ SOLN
INTRAMUSCULAR | Status: DC | PRN
  Administered 2016-01-02 (×2): 50 ug via INTRAVENOUS

## 2016-01-02 MED ORDER — MIDAZOLAM HCL 5 MG/5ML IJ SOLN
INTRAMUSCULAR | Status: AC
  Filled 2016-01-02: qty 5

## 2016-01-02 MED ORDER — ONDANSETRON HCL 4 MG/2ML IJ SOLN: 4.0000 mg | Freq: Four times a day (QID) | INTRAMUSCULAR | Status: DC | PRN

## 2016-01-02 MED ORDER — CEFUROXIME SODIUM 1.5 G IJ SOLR: 1.5000 g | INTRAMUSCULAR | Status: DC

## 2016-01-02 MED ORDER — MIDAZOLAM HCL 2 MG/2ML IJ SOLN
INTRAMUSCULAR | Status: DC | PRN
  Administered 2016-01-02: 2 mg via INTRAVENOUS
  Administered 2016-01-02: 1 mg via INTRAVENOUS

## 2016-01-02 MED ORDER — HYDROMORPHONE HCL 1 MG/ML IJ SOLN: 1.0000 mg | Freq: Once | INTRAMUSCULAR | Status: DC

## 2016-01-02 MED ORDER — FENTANYL CITRATE (PF) 100 MCG/2ML IJ SOLN
INTRAMUSCULAR | Status: AC
  Filled 2016-01-02: qty 2

## 2016-01-02 MED ORDER — FAMOTIDINE 20 MG PO TABS: 40.0000 mg | ORAL_TABLET | ORAL | Status: DC | PRN

## 2016-01-02 MED ORDER — IOPAMIDOL (ISOVUE-300) INJECTION 61%
INTRAVENOUS | Status: DC | PRN
  Administered 2016-01-02: 40 mL via INTRAVENOUS

## 2016-01-02 MED ORDER — METHYLPREDNISOLONE SODIUM SUCC 125 MG IJ SOLR: 125.0000 mg | INTRAMUSCULAR | Status: DC | PRN

## 2016-01-02 MED ORDER — LIDOCAINE-EPINEPHRINE (PF) 1 %-1:200000 IJ SOLN
INTRAMUSCULAR | Status: AC
  Filled 2016-01-02: qty 30

## 2016-01-02 MED ORDER — SODIUM CHLORIDE 0.9 % IV SOLN
INTRAVENOUS | Status: DC
  Administered 2016-01-02: 08:00:00 via INTRAVENOUS

## 2016-01-02 MED ORDER — HEPARIN SODIUM (PORCINE) 1000 UNIT/ML IJ SOLN
INTRAMUSCULAR | Status: AC
  Filled 2016-01-02: qty 1

## 2016-01-02 SURGICAL SUPPLY — 13 items
BALLN LUTONIX DCB 7X60X130 (BALLOONS) ×4
BALLOON LUTONIX DCB 7X60X130 (BALLOONS) ×2 IMPLANT
CATH PIG 70CM (CATHETERS) ×4 IMPLANT
DEVICE PRESTO INFLATION (MISCELLANEOUS) ×4 IMPLANT
DEVICE STARCLOSE SE CLOSURE (Vascular Products) ×4 IMPLANT
GLIDEWIRE ADV .035X260CM (WIRE) ×4 IMPLANT
PACK ANGIOGRAPHY (CUSTOM PROCEDURE TRAY) ×4 IMPLANT
SHEATH ANL2 6FRX45 HC (SHEATH) ×4 IMPLANT
SHEATH BRITE TIP 4FRX11 (SHEATH) ×4 IMPLANT
SHEATH BRITE TIP 5FRX11 (SHEATH) ×4 IMPLANT
SYR MEDRAD MARK V 150ML (SYRINGE) ×4 IMPLANT
TUBING CONTRAST HIGH PRESS 72 (TUBING) ×4 IMPLANT
WIRE J 3MM .035X145CM (WIRE) ×4 IMPLANT

## 2016-01-02 NOTE — H&P (Signed)
  [facility] History & Physical Update  The patient was interviewed and re-examined.  The patient's previous History and Physical has been reviewed and is unchanged.  There is no change in the plan of care. We plan to proceed with the scheduled procedure.  Renella Steig, MD  01/02/2016, 9:10 AM

## 2016-01-02 NOTE — Discharge Instructions (Signed)
Angiogram, Care After °Refer to this sheet in the next few weeks. These instructions provide you with information about caring for yourself after your procedure. Your health care provider may also give you more specific instructions. Your treatment has been planned according to current medical practices, but problems sometimes occur. Call your health care provider if you have any problems or questions after your procedure. °WHAT TO EXPECT AFTER THE PROCEDURE °After your procedure, it is typical to have the following: °· Bruising at the catheter insertion site that usually fades within 1-2 weeks. °· Blood collecting in the tissue (hematoma) that may be painful to the touch. It should usually decrease in size and tenderness within 1-2 weeks. °HOME CARE INSTRUCTIONS °· Take medicines only as directed by your health care provider. °· You may shower 24-48 hours after the procedure or as directed by your health care provider. Remove the bandage (dressing) and gently wash the site with plain soap and water. Pat the area dry with a clean towel. Do not rub the site, because this may cause bleeding. °· Do not take baths, swim, or use a hot tub until your health care provider approves. °· Check your insertion site every day for redness, swelling, or drainage. °· Do not apply powder or lotion to the site. °· Do not lift over 10 lb (4.5 kg) for 5 days after your procedure or as directed by your health care provider. °· Ask your health care provider when it is okay to: °¨ Return to work or school. °¨ Resume usual physical activities or sports. °¨ Resume sexual activity. °· Do not drive home if you are discharged the same day as the procedure. Have someone else drive you. °· You may drive 24 hours after the procedure unless otherwise instructed by your health care provider. °· Do not operate machinery or power tools for 24 hours after the procedure or as directed by your health care provider. °· If your procedure was done as an  outpatient procedure, which means that you went home the same day as your procedure, a responsible adult should be with you for the first 24 hours after you arrive home. °· Keep all follow-up visits as directed by your health care provider. This is important. °SEEK MEDICAL CARE IF: °· You have a fever. °· You have chills. °· You have increased bleeding from the catheter insertion site. Hold pressure on the site. °SEEK IMMEDIATE MEDICAL CARE IF: °· You have unusual pain at the catheter insertion site. °· You have redness, warmth, or swelling at the catheter insertion site. °· You have drainage (other than a small amount of blood on the dressing) from the catheter insertion site. °· The catheter insertion site is bleeding, and the bleeding does not stop after 30 minutes of holding steady pressure on the site. °· The area near or just beyond the catheter insertion site becomes pale, cool, tingly, or numb. °  °This information is not intended to replace advice given to you by your health care provider. Make sure you discuss any questions you have with your health care provider. °  °Document Released: 03/08/2005 Document Revised: 09/10/2014 Document Reviewed: 01/21/2013 °Elsevier Interactive Patient Education ©2016 [employer] ° °

## 2016-01-02 NOTE — Op Note (Signed)
[facility] Percutaneous Study/Intervention Procedural Note   Date of Surgery: 01/02/2016  Surgeon(s):Telesa Jeancharles   Assistants:none  Pre-operative Diagnosis: PAD with claudication left lower extremity  Post-operative diagnosis: Same  Procedure(s) Performed: 1. Ultrasound guidance for vascular access right femoral artery 2. Catheter placement into left superficial femoral artery from right femoral approach 3. Aortogram and selective left lower extremity angiogram 4. Percutaneous transluminal angioplasty of left common femoral artery with 7 mm diameter by 6 cm length Lutonix drug-coated angioplasty balloon 5. StarClose closure device right femoral artery  EBL: 25 cc  Contrast: 40 cc  Fluoro Time: 2.2 minutes  Moderate Conscious Sedation Time: approximately 30 minutes using 3 mg of Versed and 100 mcg of Fentanyl  Indications: Patient is a 60 y.o.female with recurrent short distance claudication of the left lower extremity. The patient has noninvasive study showing monophasic flow throughout the left lower extremity from the endarterectomy site down. The patient is brought in for angiography for further evaluation and potential treatment. Risks and benefits are discussed and informed consent is obtained  Procedure: The patient was identified and appropriate procedural time out was performed. The patient was then placed supine on the table and prepped and draped in the usual sterile fashion.Moderate conscious sedation was administered during a face to face encounter with the patient throughout the procedure with my supervision of the RN administering medicines and monitoring the patient's vital signs, pulse oximetry, telemetry and mental status throughout from the start of the procedure until the patient was taken to the recovery room. Ultrasound was used to evaluate the right common  femoral artery. It was patent . A digital ultrasound image was acquired. A Seldinger needle was used to access the right common femoral artery under direct ultrasound guidance and a permanent image was performed. A 0.035 J wire was advanced without resistance and a 5Fr sheath was placed. Pigtail catheter was placed into the aorta and an AP aortogram was performed. This demonstrated normal renal arteries and normal aorta although it was highly calcified and iliac segments without significant stenosis. I then crossed the aortic bifurcation and advanced to the left femoral head. Selective left lower extremity angiogram was then performed. This demonstrated 75-80% stenosis in the proximal left common femoral artery at what was likely the endpoint of the endarterectomy/clamp site from her previous surgery. The remainder of the endarterectomy is widely patent. Her SFA and popliteal stents are widely patent. Mild stenosis in the tibioperoneal trunk that did not appear flow limiting with one-vessel runoff distally. The patient was systemically heparinized and a 6 [place] Ansell sheath was then placed over the [employer] wire. I then used a Kumpe catheter and the advantage wire to get into the superficial femoral artery for good stable access for treatment. The catheter was then removed. I elected to proceed with treatment and a 7 mm diameter by 6 cm length Lutonix drug-coated angioplasty balloon was inflated from the distal external iliac artery to the mid common femoral artery encompassing the lesion which was basically at the level of the circumflex vessels. This was a tight narrowing and required 12-14 atm to break the waist. The inflation was held for 1 minute. Completion angiogram showed only about a 10-15% residual stenosis. I elected to terminate the procedure. The sheath was removed and StarClose closure device was deployed in the right femoral artery with excellent hemostatic result. The patient was taken  to the recovery room in stable condition having tolerated the procedure well.  Findings:  Aortogram:  normal renal arteries and normal aorta although it was highly calcified and iliac segments without significant stenosis. Left Lower Extremity: 75-80% stenosis in the proximal left common femoral artery at what was likely the endpoint of the endarterectomy/clamp site from her previous surgery. The remainder of the endarterectomy is widely patent. Her SFA and popliteal stents are widely patent. Mild stenosis in the tibioperoneal trunk that did not appear flow limiting with one-vessel runoff distally.   Disposition: Patient was taken to the recovery room in stable condition having tolerated the procedure well.  Complications: None  Shelley Cocke 01/02/2016 10:23 AM

## 2016-01-03 ENCOUNTER — Encounter: Payer: Self-pay | Admitting: Vascular Surgery

## 2016-01-04 DIAGNOSIS — I739 Peripheral vascular disease, unspecified: Secondary | ICD-10-CM | POA: Diagnosis not present

## 2016-01-04 DIAGNOSIS — I10 Essential (primary) hypertension: Secondary | ICD-10-CM | POA: Diagnosis not present

## 2016-01-04 DIAGNOSIS — J449 Chronic obstructive pulmonary disease, unspecified: Secondary | ICD-10-CM | POA: Diagnosis not present

## 2016-01-04 DIAGNOSIS — M4306 Spondylolysis, lumbar region: Secondary | ICD-10-CM | POA: Diagnosis not present

## 2016-01-30 DIAGNOSIS — I10 Essential (primary) hypertension: Secondary | ICD-10-CM | POA: Diagnosis not present

## 2016-01-30 DIAGNOSIS — I2510 Atherosclerotic heart disease of native coronary artery without angina pectoris: Secondary | ICD-10-CM | POA: Diagnosis not present

## 2016-01-30 DIAGNOSIS — I340 Nonrheumatic mitral (valve) insufficiency: Secondary | ICD-10-CM | POA: Diagnosis not present

## 2016-02-07 DIAGNOSIS — F17200 Nicotine dependence, unspecified, uncomplicated: Secondary | ICD-10-CM | POA: Diagnosis not present

## 2016-02-07 DIAGNOSIS — I739 Peripheral vascular disease, unspecified: Secondary | ICD-10-CM | POA: Diagnosis not present

## 2016-02-07 DIAGNOSIS — I10 Essential (primary) hypertension: Secondary | ICD-10-CM | POA: Diagnosis not present

## 2016-02-07 DIAGNOSIS — E785 Hyperlipidemia, unspecified: Secondary | ICD-10-CM | POA: Diagnosis not present

## 2016-03-27 ENCOUNTER — Other Ambulatory Visit: Payer: Self-pay | Admitting: Physician Assistant

## 2016-03-27 ENCOUNTER — Encounter: Payer: Self-pay | Admitting: *Deleted

## 2016-03-27 DIAGNOSIS — D381 Neoplasm of uncertain behavior of trachea, bronchus and lung: Secondary | ICD-10-CM

## 2016-04-13 ENCOUNTER — Ambulatory Visit
Admission: RE | Admit: 2016-04-13 | Discharge: 2016-04-13 | Disposition: A | Discharge status: Home / Self Care | Payer: Medicare Other | Source: Ambulatory Visit | Attending: Physician Assistant | Admitting: Physician Assistant

## 2016-04-13 DIAGNOSIS — I2510 Atherosclerotic heart disease of native coronary artery without angina pectoris: Secondary | ICD-10-CM | POA: Insufficient documentation

## 2016-04-13 DIAGNOSIS — J439 Emphysema, unspecified: Secondary | ICD-10-CM | POA: Insufficient documentation

## 2016-04-13 DIAGNOSIS — I700 Atherosclerosis of aorta: Secondary | ICD-10-CM | POA: Insufficient documentation

## 2016-04-13 DIAGNOSIS — J984 Other disorders of lung: Secondary | ICD-10-CM | POA: Diagnosis not present

## 2016-04-13 DIAGNOSIS — D381 Neoplasm of uncertain behavior of trachea, bronchus and lung: Secondary | ICD-10-CM | POA: Diagnosis not present

## 2016-04-13 DIAGNOSIS — J189 Pneumonia, unspecified organism: Secondary | ICD-10-CM | POA: Diagnosis not present

## 2016-04-13 DIAGNOSIS — R918 Other nonspecific abnormal finding of lung field: Secondary | ICD-10-CM | POA: Diagnosis not present

## 2016-04-13 LAB — POCT I-STAT CREATININE: CREATININE: 0.9 mg/dL (ref 0.44–1.00)

## 2016-04-13 MED ORDER — IOPAMIDOL (ISOVUE-300) INJECTION 61%
75.0000 mL | Freq: Once | INTRAVENOUS | Status: AC | PRN
  Administered 2016-04-13: 75 mL via INTRAVENOUS

## 2016-04-16 DIAGNOSIS — R911 Solitary pulmonary nodule: Secondary | ICD-10-CM | POA: Diagnosis not present

## 2016-04-16 DIAGNOSIS — D649 Anemia, unspecified: Secondary | ICD-10-CM | POA: Diagnosis not present

## 2016-04-16 DIAGNOSIS — E782 Mixed hyperlipidemia: Secondary | ICD-10-CM | POA: Diagnosis not present

## 2016-04-16 DIAGNOSIS — E611 Iron deficiency: Secondary | ICD-10-CM | POA: Diagnosis not present

## 2016-04-16 DIAGNOSIS — F17210 Nicotine dependence, cigarettes, uncomplicated: Secondary | ICD-10-CM | POA: Diagnosis not present

## 2016-04-16 DIAGNOSIS — D381 Neoplasm of uncertain behavior of trachea, bronchus and lung: Secondary | ICD-10-CM | POA: Diagnosis not present

## 2016-04-16 DIAGNOSIS — I2510 Atherosclerotic heart disease of native coronary artery without angina pectoris: Secondary | ICD-10-CM | POA: Diagnosis not present

## 2016-04-16 DIAGNOSIS — J449 Chronic obstructive pulmonary disease, unspecified: Secondary | ICD-10-CM | POA: Diagnosis not present

## 2016-04-16 DIAGNOSIS — R0602 Shortness of breath: Secondary | ICD-10-CM | POA: Diagnosis not present

## 2016-04-16 DIAGNOSIS — J45909 Unspecified asthma, uncomplicated: Secondary | ICD-10-CM | POA: Diagnosis not present

## 2016-05-03 DIAGNOSIS — M4306 Spondylolysis, lumbar region: Secondary | ICD-10-CM | POA: Diagnosis not present

## 2016-05-03 DIAGNOSIS — I70212 Atherosclerosis of native arteries of extremities with intermittent claudication, left leg: Secondary | ICD-10-CM | POA: Diagnosis not present

## 2016-05-03 DIAGNOSIS — Z0001 Encounter for general adult medical examination with abnormal findings: Secondary | ICD-10-CM | POA: Diagnosis not present

## 2016-05-03 DIAGNOSIS — J439 Emphysema, unspecified: Secondary | ICD-10-CM | POA: Diagnosis not present

## 2016-05-03 DIAGNOSIS — I2510 Atherosclerotic heart disease of native coronary artery without angina pectoris: Secondary | ICD-10-CM | POA: Diagnosis not present

## 2016-05-08 ENCOUNTER — Other Ambulatory Visit: Payer: Self-pay | Admitting: Physician Assistant

## 2016-05-08 DIAGNOSIS — Z1231 Encounter for screening mammogram for malignant neoplasm of breast: Secondary | ICD-10-CM

## 2016-05-09 DIAGNOSIS — I6523 Occlusion and stenosis of bilateral carotid arteries: Secondary | ICD-10-CM | POA: Diagnosis not present

## 2016-05-09 DIAGNOSIS — E785 Hyperlipidemia, unspecified: Secondary | ICD-10-CM | POA: Diagnosis not present

## 2016-05-11 DIAGNOSIS — I739 Peripheral vascular disease, unspecified: Secondary | ICD-10-CM | POA: Diagnosis not present

## 2016-05-11 DIAGNOSIS — I10 Essential (primary) hypertension: Secondary | ICD-10-CM | POA: Diagnosis not present

## 2016-05-11 DIAGNOSIS — E785 Hyperlipidemia, unspecified: Secondary | ICD-10-CM | POA: Diagnosis not present

## 2016-05-11 DIAGNOSIS — F17200 Nicotine dependence, unspecified, uncomplicated: Secondary | ICD-10-CM | POA: Diagnosis not present

## 2016-05-30 ENCOUNTER — Other Ambulatory Visit: Payer: Self-pay | Admitting: Physician Assistant

## 2016-05-30 ENCOUNTER — Ambulatory Visit
Admission: RE | Admit: 2016-05-30 | Discharge: 2016-05-30 | Disposition: A | Discharge status: Home / Self Care | Payer: Medicare Other | Source: Ambulatory Visit | Attending: Physician Assistant | Admitting: Physician Assistant

## 2016-05-30 DIAGNOSIS — Z1231 Encounter for screening mammogram for malignant neoplasm of breast: Secondary | ICD-10-CM

## 2016-06-13 DIAGNOSIS — H2513 Age-related nuclear cataract, bilateral: Secondary | ICD-10-CM | POA: Diagnosis not present

## 2016-07-19 DIAGNOSIS — I6523 Occlusion and stenosis of bilateral carotid arteries: Secondary | ICD-10-CM | POA: Diagnosis not present

## 2016-07-19 DIAGNOSIS — J449 Chronic obstructive pulmonary disease, unspecified: Secondary | ICD-10-CM | POA: Diagnosis not present

## 2016-07-19 DIAGNOSIS — D381 Neoplasm of uncertain behavior of trachea, bronchus and lung: Secondary | ICD-10-CM | POA: Diagnosis not present

## 2016-07-20 DIAGNOSIS — I2510 Atherosclerotic heart disease of native coronary artery without angina pectoris: Secondary | ICD-10-CM | POA: Diagnosis not present

## 2016-07-20 DIAGNOSIS — R0789 Other chest pain: Secondary | ICD-10-CM | POA: Diagnosis not present

## 2016-07-20 DIAGNOSIS — I340 Nonrheumatic mitral (valve) insufficiency: Secondary | ICD-10-CM | POA: Diagnosis not present

## 2016-07-20 DIAGNOSIS — I10 Essential (primary) hypertension: Secondary | ICD-10-CM | POA: Diagnosis not present

## 2016-07-30 DIAGNOSIS — R911 Solitary pulmonary nodule: Secondary | ICD-10-CM | POA: Diagnosis not present

## 2016-07-30 DIAGNOSIS — I2510 Atherosclerotic heart disease of native coronary artery without angina pectoris: Secondary | ICD-10-CM | POA: Diagnosis not present

## 2016-07-30 DIAGNOSIS — Z720 Tobacco use: Secondary | ICD-10-CM | POA: Diagnosis not present

## 2016-07-30 DIAGNOSIS — R9430 Abnormal result of cardiovascular function study, unspecified: Secondary | ICD-10-CM | POA: Diagnosis not present

## 2016-07-30 DIAGNOSIS — R079 Chest pain, unspecified: Secondary | ICD-10-CM | POA: Diagnosis not present

## 2016-07-31 DIAGNOSIS — I2510 Atherosclerotic heart disease of native coronary artery without angina pectoris: Secondary | ICD-10-CM | POA: Diagnosis not present

## 2016-07-31 DIAGNOSIS — Z720 Tobacco use: Secondary | ICD-10-CM | POA: Diagnosis not present

## 2016-07-31 DIAGNOSIS — R911 Solitary pulmonary nodule: Secondary | ICD-10-CM | POA: Diagnosis not present

## 2016-07-31 DIAGNOSIS — R9430 Abnormal result of cardiovascular function study, unspecified: Secondary | ICD-10-CM | POA: Diagnosis not present

## 2016-07-31 DIAGNOSIS — R079 Chest pain, unspecified: Secondary | ICD-10-CM | POA: Diagnosis not present

## 2016-08-08 DIAGNOSIS — I6523 Occlusion and stenosis of bilateral carotid arteries: Secondary | ICD-10-CM | POA: Diagnosis not present

## 2016-08-08 DIAGNOSIS — R05 Cough: Secondary | ICD-10-CM | POA: Diagnosis not present

## 2016-08-15 DIAGNOSIS — R0602 Shortness of breath: Secondary | ICD-10-CM | POA: Diagnosis not present

## 2016-09-05 DIAGNOSIS — R51 Headache: Secondary | ICD-10-CM | POA: Diagnosis not present

## 2016-09-06 ENCOUNTER — Encounter: Payer: Self-pay | Admitting: Emergency Medicine

## 2016-09-06 ENCOUNTER — Emergency Department
Admission: EM | Admit: 2016-09-06 | Discharge: 2016-09-06 | Disposition: A | Discharge status: Home / Self Care | Payer: Medicare Other | Attending: Emergency Medicine | Admitting: Emergency Medicine

## 2016-09-06 ENCOUNTER — Emergency Department: Payer: Medicare Other

## 2016-09-06 DIAGNOSIS — I2510 Atherosclerotic heart disease of native coronary artery without angina pectoris: Secondary | ICD-10-CM | POA: Diagnosis not present

## 2016-09-06 DIAGNOSIS — J449 Chronic obstructive pulmonary disease, unspecified: Secondary | ICD-10-CM | POA: Insufficient documentation

## 2016-09-06 DIAGNOSIS — J45909 Unspecified asthma, uncomplicated: Secondary | ICD-10-CM | POA: Insufficient documentation

## 2016-09-06 DIAGNOSIS — I10 Essential (primary) hypertension: Secondary | ICD-10-CM | POA: Diagnosis not present

## 2016-09-06 DIAGNOSIS — Z79899 Other long term (current) drug therapy: Secondary | ICD-10-CM | POA: Insufficient documentation

## 2016-09-06 DIAGNOSIS — I252 Old myocardial infarction: Secondary | ICD-10-CM | POA: Insufficient documentation

## 2016-09-06 DIAGNOSIS — Z7982 Long term (current) use of aspirin: Secondary | ICD-10-CM | POA: Insufficient documentation

## 2016-09-06 DIAGNOSIS — R519 Headache, unspecified: Secondary | ICD-10-CM

## 2016-09-06 DIAGNOSIS — J0100 Acute maxillary sinusitis, unspecified: Secondary | ICD-10-CM | POA: Diagnosis not present

## 2016-09-06 DIAGNOSIS — R51 Headache: Secondary | ICD-10-CM | POA: Diagnosis not present

## 2016-09-06 DIAGNOSIS — F17210 Nicotine dependence, cigarettes, uncomplicated: Secondary | ICD-10-CM | POA: Diagnosis not present

## 2016-09-06 MED ORDER — HYDROMORPHONE HCL 1 MG/ML IJ SOLN: 1.0000 mg | Freq: Once | INTRAMUSCULAR | Status: DC

## 2016-09-06 MED ORDER — METOCLOPRAMIDE HCL 5 MG/ML IJ SOLN
INTRAMUSCULAR | Status: AC
  Administered 2016-09-06: 10 mg via INTRAVENOUS
  Filled 2016-09-06: qty 2

## 2016-09-06 MED ORDER — KETOROLAC TROMETHAMINE 30 MG/ML IJ SOLN
30.0000 mg | Freq: Once | INTRAMUSCULAR | Status: AC
  Administered 2016-09-06: 30 mg via INTRAVENOUS

## 2016-09-06 MED ORDER — AMOXICILLIN-POT CLAVULANATE 875-125 MG PO TABS
1.0000 | ORAL_TABLET | Freq: Two times a day (BID) | ORAL | 0 refills | Status: AC
Start: 2016-09-06 — End: 2016-09-16

## 2016-09-06 MED ORDER — MORPHINE SULFATE (PF) 4 MG/ML IV SOLN
4.0000 mg | Freq: Once | INTRAVENOUS | Status: AC
  Administered 2016-09-06: 4 mg via INTRAVENOUS

## 2016-09-06 MED ORDER — DIPHENHYDRAMINE HCL 50 MG/ML IJ SOLN
25.0000 mg | Freq: Once | INTRAMUSCULAR | Status: AC
  Administered 2016-09-06: 25 mg via INTRAVENOUS

## 2016-09-06 MED ORDER — DIPHENHYDRAMINE HCL 50 MG/ML IJ SOLN
INTRAMUSCULAR | Status: AC
  Administered 2016-09-06: 25 mg via INTRAVENOUS
  Filled 2016-09-06: qty 1

## 2016-09-06 MED ORDER — METOCLOPRAMIDE HCL 5 MG/ML IJ SOLN
10.0000 mg | Freq: Once | INTRAMUSCULAR | Status: AC
  Administered 2016-09-06: 10 mg via INTRAVENOUS

## 2016-09-06 MED ORDER — MORPHINE SULFATE (PF) 4 MG/ML IV SOLN
INTRAVENOUS | Status: AC
  Administered 2016-09-06: 4 mg via INTRAVENOUS
  Filled 2016-09-06: qty 1

## 2016-09-06 MED ORDER — SODIUM CHLORIDE 0.9 % IV BOLUS (SEPSIS)
1000.0000 mL | Freq: Once | INTRAVENOUS | Status: AC
  Administered 2016-09-06: 1000 mL via INTRAVENOUS

## 2016-09-06 MED ORDER — KETOROLAC TROMETHAMINE 30 MG/ML IJ SOLN
INTRAMUSCULAR | Status: AC
  Administered 2016-09-06: 30 mg via INTRAVENOUS
  Filled 2016-09-06: qty 1

## 2016-09-06 NOTE — ED Triage Notes (Addendum)
Pt to triage via EMS from home, report headache x 1.5 hr, reports hx of migraines, but states feels different.  PT states pain to left side of face and head

## 2016-09-06 NOTE — ED Provider Notes (Signed)
[facility] Emergency Department Provider Note    First MD Initiated Contact with Patient 09/06/16 [phone]     (approximate)  I have reviewed the triage vital signs and the nursing notes.   HISTORY  Chief Complaint Headache    HPI [name] is a 60 y.o. female with bolus of chronic medical conditions presents to the emergency department with "migraine headache times one and a half hours. Patient states that she has pain along the entire left side of her face extending to the top of her head. Patient states her pain score arrival to emergency department was 10 out of 10. Patient denies any neck pain stiffness. Patient denies any weakness numbness visual changes or gait instability.   Past Medical History:  Diagnosis Date  . Arthritis   . Asthma   . Carotid artery stenosis   . COPD (chronic obstructive pulmonary disease) ([place])   . Coronary artery disease   . Hemorrhoids   . Hyperlipidemia   . Hypertension   . 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negative cardiac cath  . Nicotine addiction   . Peripheral vascular disease ([place])   . Pneumonia   . Shortness of breath dyspnea     Patient Active Problem List   Diagnosis Date Noted  . Ischemic leg 04/25/2015  . Blood in stool   . Benign neoplasm of rectosigmoid junction   . History of colonic polyps 04/11/2015  . H/O acute myocardial infarction 04/11/2015  . H/O hypercholesterolemia 04/11/2015  . H/O disease 04/11/2015  . Hemorrhoids, internal 04/11/2015  . Hypertension 04/11/2015  . Heart disease 04/11/2015  . Hematochezia 02/09/2015  . Acute post-hemorrhagic anemia 02/09/2015  . Blood in feces 02/09/2015  . Acute blood loss anemia 02/09/2015  . Necrotizing pneumonia ([place]) 01/22/2015  . Abscess of lung ([place]) 01/22/2015    Past Surgical History:  Procedure Laterality Date  . ABDOMINAL HYSTERECTOMY    . APPENDECTOMY    . CARDIAC CATHETERIZATION    . CARDIAC SURGERY    . COLONOSCOPY  12/15/11   OH->bleeding internal hemorrhoids, otherwise normal  . COLONOSCOPY WITH PROPOFOL N/A 04/12/2015   Procedure: COLONOSCOPY WITH PROPOFOL;  Surgeon: Lucilla Lame, MD;  Location: [facility] ENDOSCOPY;  Service: Endoscopy;  Laterality: N/A;  . ENDARTERECTOMY FEMORAL Left 04/26/2015   Procedure: ENDARTERECTOMY FEMORAL;  Surgeon: Algernon Huxley, MD;  Location: [facility] ORS;  Service: Vascular;  Laterality: Left;  . ENDOVASCULAR STENT INSERTION Bilateral    Legs  . FLEXIBLE BRONCHOSCOPY N/A 02/02/2015   Procedure: FLEXIBLE BRONCHOSCOPY;  Surgeon: Allyne Gee, MD;  Location: [facility] ORS;  Service: Pulmonary;  Laterality: N/A;  . HEMORRHOID SURGERY    . PERIPHERAL VASCULAR CATHETERIZATION Left 04/25/2015   Procedure: Lower Extremity Angiography;  Surgeon: Algernon Huxley, MD;  Location: [place] [facility];  Service: Cardiovascular;  Laterality: Left;  . PERIPHERAL VASCULAR CATHETERIZATION Left 04/25/2015   Procedure: Lower Extremity Intervention;  Surgeon: Algernon Huxley, MD;  Location: [place] [facility];  Service: Cardiovascular;  Laterality: Left;  . PERIPHERAL VASCULAR CATHETERIZATION Left 01/02/2016   Procedure: Lower Extremity Angiography;  Surgeon: Algernon Huxley, MD;  Location: [place] [facility];  Service: Cardiovascular;  Laterality: Left;  . PERIPHERAL VASCULAR CATHETERIZATION  01/02/2016   Procedure: Lower Extremity Intervention;  Surgeon: Algernon Huxley, MD;  Location: [place] [facility];  Service: Cardiovascular;;  . THROMBECTOMY FEMORAL ARTERY Left 04/26/2015   Procedure: THROMBECTOMY FEMORAL ARTERY;  Surgeon: Algernon Huxley, MD;  Location: [facility] ORS;  Service: Vascular;  Laterality: Left;    Prior to Admission medications   Medication Sig Start Date End Date Taking? Authorizing Provider  apixaban (ELIQUIS) 2.5 MG TABS tablet Take 1 tablet (2.5 mg total) by mouth 2 (two) times daily. 04/29/15   Algernon Huxley, MD  aspirin EC 81 MG EC tablet Take 1 tablet (81 mg total) by mouth daily. 04/29/15   Algernon Huxley, MD  atorvastatin  (LIPITOR) 80 MG tablet Take 80 mg by mouth every morning.  11/15/14   Historical Provider, MD  budesonide-formoterol (SYMBICORT) 80-4.5 MCG/ACT inhaler Inhale 2 puffs into the lungs daily.    Historical Provider, MD  cetirizine (ZYRTEC) 10 MG tablet Take 10 mg by mouth daily.    Historical Provider, MD  Cholecalciferol (VITAMIN D3) 2000 units TABS Take 1 tablet by mouth daily.    Historical Provider, MD  feeding supplement, ENSURE ENLIVE, (ENSURE ENLIVE) LIQD Take 237 mLs by mouth 3 (three) times daily with meals. 02/03/15   Epifanio Lesches, MD  ferrous sulfate 325 (65 FE) MG tablet Take 325 mg by mouth daily.    Historical Provider, MD  gabapentin (NEURONTIN) 300 MG capsule Take 1 capsule (300 mg total) by mouth 3 (three) times daily as needed (Nerve Pain). 04/29/15   Algernon Huxley, MD  HYDROcodone-acetaminophen (NORCO/VICODIN) 5-325 MG per tablet Take 1-2 tablets by mouth every 4 (four) hours as needed for moderate pain. 04/29/15   Algernon Huxley, MD  hydrocortisone (PROCTOSOL HC) 2.5 % rectal cream Place 1 application rectally 2 (two) times daily. 02/04/15   Epifanio Lesches, MD  losartan (COZAAR) 50 MG tablet Take 50 mg by mouth daily. 12/09/14   Historical Provider, MD  metoprolol (LOPRESSOR) 50 MG tablet Take 100 mg by mouth daily. 01/19/15   Historical Provider, MD  pregabalin (LYRICA) 75 MG capsule Take 1 capsule (75 mg total) by mouth 2 (two) times daily. 04/29/15   Algernon Huxley, MD    Allergies Aspirin and Zyrtec [cetirizine]  Family History  Problem Relation Age of Onset  . Hypertension Mother   . Hypertension Father   . Breast cancer Neg Hx     Social History Social History  Substance Use Topics  . Smoking status: Current Every Day Smoker    Packs/day: 1.00    Years: 41.00    Types: Cigarettes  . Smokeless tobacco: Never Used     Comment: pt recommend to stop smoking 4 min spent will start on prn nicotine replacment  . Alcohol use 0.0 oz/week     Comment: weekends, couple beers,  pint of liquer only on weekends    Review of Systems Constitutional: No fever/chills Eyes: No visual changes. ENT: No sore throat.Positive for left facial pain Cardiovascular: Denies chest pain. Respiratory: Denies shortness of breath. Gastrointestinal: No abdominal pain.  No nausea, no vomiting.  No diarrhea.  No constipation. Genitourinary: Negative for dysuria. Musculoskeletal: Negative for back pain. Skin: Negative for rash. Neurological: Positive for for headaches, negative for focal weakness or numbness.  10-point ROS otherwise negative.  ____________________________________________   PHYSICAL EXAM:  VITAL SIGNS: ED Triage Vitals [09/06/16 0120]  Enc Vitals Group     BP 140/81     Pulse Rate 78     Resp 20     Temp 97.8 F (36.6 C)     Temp Source Oral     SpO2 97 %     Weight 136 lb (61.7 kg)     Height 5\' 4"  (1.626 m)  Head Circumference      Peak Flow      Pain Score 9     Pain Loc      Pain Edu?      Excl. in [place]?     Constitutional: Alert and oriented. Well appearing and in no acute distress. Eyes: Conjunctivae are normal. PERRL. EOMI. Head: Atraumatic.Pale bilateral maxillary sinus palpation Ears:  Healthy appearing ear canals and TMs bilaterally Nose: No congestion/rhinnorhea. Mouth/Throat: Mucous membranes are moist.  Oropharynx non-erythematous. Neck: No stridor.  No meningeal signs.  Cardiovascular: Normal rate, regular rhythm. Good peripheral circulation. Grossly normal heart sounds. Respiratory: Normal respiratory effort.  No retractions. Lungs CTAB. Gastrointestinal: Soft and nontender. No distention.  Musculoskeletal: No lower extremity tenderness nor edema. No gross deformities of extremities. Neurologic:  Normal speech and language. No gross focal neurologic deficits are appreciated.  Skin:  Skin is warm, dry and intact. No rash noted. Psychiatric: Mood and affect are normal. Speech and behavior are normal.   RADIOLOGY {I, [place] N  BROWN, personally viewed and evaluated these images (plain radiographs) as part of my medical decision making, as well as reviewing the written report by the radiologist.  Ct Head Wo Contrast  Result Date: 09/06/2016 CLINICAL DATA:  Headache pain to the left side of face and head EXAM: CT HEAD WITHOUT CONTRAST TECHNIQUE: Contiguous axial images were obtained from the base of the skull through the vertex without intravenous contrast. COMPARISON:  09/02/2011 FINDINGS: Brain: No evidence of acute infarction, hemorrhage, hydrocephalus, extra-axial collection or mass lesion/mass effect. Vascular: No hyperdense vessels. There are carotid artery calcifications. Skull: Normal. Negative for fracture or focal lesion. Sinuses/Orbits: Fluid level in the right maxillary sinus. Ethmoidal sinus mucosal thickening. No acute orbital abnormality. Other: None IMPRESSION: 1. No CT evidence for acute intracranial abnormality. 2. Sinusitis Electronically Signed   By: Donavan Foil M.D.   On: 09/06/2016 02:04     Procedures    INITIAL IMPRESSION / ASSESSMENT AND PLAN / ED COURSE  Pertinent labs & imaging results that were available during my care of the patient were reviewed by me and considered in my medical decision making (see chart for details).  History physical exam consistent with sinus headache. Patient given Toradol Reglan without improvement of pain. As such patient was given IV morphine with complete resolution of pain.   Clinical Course     ____________________________________________  FINAL CLINICAL IMPRESSION(S) / ED DIAGNOSES  Final diagnoses:  Sinus headache  Acute maxillary sinusitis, recurrence not specified     MEDICATIONS GIVEN DURING THIS VISIT:  Medications  sodium chloride 0.9 % bolus 1,000 mL (0 mLs Intravenous Stopped 09/06/16 0237)  metoCLOPramide (REGLAN) injection 10 mg (10 mg Intravenous Given 09/06/16 0135)  ketorolac (TORADOL) 30 MG/ML injection 30 mg (30 mg Intravenous Given  09/06/16 0135)  diphenhydrAMINE (BENADRYL) injection 25 mg (25 mg Intravenous Given 09/06/16 0135)  morphine 4 MG/ML injection 4 mg (4 mg Intravenous Given 09/06/16 0207)     NEW OUTPATIENT MEDICATIONS STARTED DURING THIS VISIT:  New Prescriptions   No medications on file    Modified Medications   No medications on file    Discontinued Medications   No medications on file     Note:  This document was prepared using Dragon voice recognition software and may include unintentional dictation errors.    Gregor Hams, MD 09/06/16 [phone]

## 2016-09-13 DIAGNOSIS — I6523 Occlusion and stenosis of bilateral carotid arteries: Secondary | ICD-10-CM | POA: Diagnosis not present

## 2016-09-13 DIAGNOSIS — I10 Essential (primary) hypertension: Secondary | ICD-10-CM | POA: Diagnosis not present

## 2016-09-13 DIAGNOSIS — J449 Chronic obstructive pulmonary disease, unspecified: Secondary | ICD-10-CM | POA: Diagnosis not present

## 2016-10-04 ENCOUNTER — Emergency Department
Admission: EM | Admit: 2016-10-04 | Discharge: 2016-10-05 | Disposition: A | Discharge status: Home / Self Care | Payer: Medicare Other | Attending: Student in an Organized Health Care Education/Training Program | Admitting: Student in an Organized Health Care Education/Training Program

## 2016-10-04 ENCOUNTER — Emergency Department: Payer: Medicare Other

## 2016-10-04 ENCOUNTER — Encounter: Payer: Self-pay | Admitting: Emergency Medicine

## 2016-10-04 DIAGNOSIS — J45909 Unspecified asthma, uncomplicated: Secondary | ICD-10-CM | POA: Insufficient documentation

## 2016-10-04 DIAGNOSIS — J449 Chronic obstructive pulmonary disease, unspecified: Secondary | ICD-10-CM | POA: Insufficient documentation

## 2016-10-04 DIAGNOSIS — I10 Essential (primary) hypertension: Secondary | ICD-10-CM | POA: Diagnosis not present

## 2016-10-04 DIAGNOSIS — F17210 Nicotine dependence, cigarettes, uncomplicated: Secondary | ICD-10-CM | POA: Insufficient documentation

## 2016-10-04 DIAGNOSIS — R519 Headache, unspecified: Secondary | ICD-10-CM

## 2016-10-04 DIAGNOSIS — R51 Headache: Secondary | ICD-10-CM | POA: Diagnosis not present

## 2016-10-04 DIAGNOSIS — Z7982 Long term (current) use of aspirin: Secondary | ICD-10-CM | POA: Insufficient documentation

## 2016-10-04 DIAGNOSIS — I2510 Atherosclerotic heart disease of native coronary artery without angina pectoris: Secondary | ICD-10-CM | POA: Insufficient documentation

## 2016-10-04 MED ORDER — DEXAMETHASONE SODIUM PHOSPHATE 10 MG/ML IJ SOLN
10.0000 mg | Freq: Once | INTRAMUSCULAR | Status: AC
  Administered 2016-10-04: 10 mg via INTRAVENOUS
  Filled 2016-10-04: qty 1

## 2016-10-04 MED ORDER — ACETAMINOPHEN 500 MG PO TABS
1000.0000 mg | ORAL_TABLET | Freq: Once | ORAL | Status: AC
  Administered 2016-10-04: 1000 mg via ORAL
  Filled 2016-10-04: qty 2

## 2016-10-04 MED ORDER — PROCHLORPERAZINE EDISYLATE 5 MG/ML IJ SOLN
10.0000 mg | Freq: Once | INTRAMUSCULAR | Status: AC
  Administered 2016-10-04: 10 mg via INTRAVENOUS
  Filled 2016-10-04: qty 2

## 2016-10-04 MED ORDER — DIPHENHYDRAMINE HCL 50 MG/ML IJ SOLN
25.0000 mg | Freq: Once | INTRAMUSCULAR | Status: AC
  Administered 2016-10-04: 25 mg via INTRAVENOUS
  Filled 2016-10-04: qty 1

## 2016-10-04 MED ORDER — SODIUM CHLORIDE 0.9 % IV BOLUS (SEPSIS)
1000.0000 mL | Freq: Once | INTRAVENOUS | Status: AC
  Administered 2016-10-04: 1000 mL via INTRAVENOUS

## 2016-10-04 NOTE — Discharge Instructions (Signed)

## 2016-10-04 NOTE — ED Provider Notes (Signed)
[facility] Emergency Department Provider Note    None    (approximate)  I have reviewed the triage vital signs and the nursing notes.   HISTORY  Chief Complaint Headache    HPI [name] is a 61 y.o. female with a history of migraines as well as sinusitis presents with left-sided headache that started after she smelled some chemical continues to clean her apartment. Headache is been ongoing for the past 2-3 hours. Associated with photo phobia. No recent fevers. States that she does feel nauseated but no vomiting. States similar symptoms in the past consistent with previous migraines. No history of trauma.   Past Medical History:  Diagnosis Date  . Arthritis   . Asthma   . Carotid artery stenosis   . COPD (chronic obstructive pulmonary disease) ([place])   . Coronary artery disease   . Hemorrhoids   . Hyperlipidemia   . Hypertension   . 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negative cardiac cath  . Nicotine addiction   . Peripheral vascular disease ([place])   . Pneumonia   . Shortness of breath dyspnea    Family History  Problem Relation Age of Onset  . Hypertension Mother   . Hypertension Father   . Breast cancer Neg Hx    Past Surgical History:  Procedure Laterality Date  . ABDOMINAL HYSTERECTOMY    . APPENDECTOMY    . CARDIAC CATHETERIZATION    . CARDIAC SURGERY    . COLONOSCOPY  12/15/11   OH->bleeding internal hemorrhoids, otherwise normal  . COLONOSCOPY WITH PROPOFOL N/A 04/12/2015   Procedure: COLONOSCOPY WITH PROPOFOL;  Surgeon: Lucilla Lame, MD;  Location: [facility] ENDOSCOPY;  Service: Endoscopy;  Laterality: N/A;  . ENDARTERECTOMY FEMORAL Left 04/26/2015   Procedure: ENDARTERECTOMY FEMORAL;  Surgeon: Algernon Huxley, MD;  Location: [facility] ORS;  Service: Vascular;  Laterality: Left;  . ENDOVASCULAR STENT INSERTION Bilateral    Legs  . FLEXIBLE BRONCHOSCOPY N/A 02/02/2015   Procedure: FLEXIBLE BRONCHOSCOPY;  Surgeon: Allyne Gee, MD;  Location: [facility] ORS;   Service: Pulmonary;  Laterality: N/A;  . HEMORRHOID SURGERY    . PERIPHERAL VASCULAR CATHETERIZATION Left 04/25/2015   Procedure: Lower Extremity Angiography;  Surgeon: Algernon Huxley, MD;  Location: [place] [facility];  Service: Cardiovascular;  Laterality: Left;  . PERIPHERAL VASCULAR CATHETERIZATION Left 04/25/2015   Procedure: Lower Extremity Intervention;  Surgeon: Algernon Huxley, MD;  Location: [place] [facility];  Service: Cardiovascular;  Laterality: Left;  . PERIPHERAL VASCULAR CATHETERIZATION Left 01/02/2016   Procedure: Lower Extremity Angiography;  Surgeon: Algernon Huxley, MD;  Location: [place] [facility];  Service: Cardiovascular;  Laterality: Left;  . PERIPHERAL VASCULAR CATHETERIZATION  01/02/2016   Procedure: Lower Extremity Intervention;  Surgeon: Algernon Huxley, MD;  Location: [place] [facility];  Service: Cardiovascular;;  . THROMBECTOMY FEMORAL ARTERY Left 04/26/2015   Procedure: THROMBECTOMY FEMORAL ARTERY;  Surgeon: Algernon Huxley, MD;  Location: [facility] ORS;  Service: Vascular;  Laterality: Left;   Patient Active Problem List   Diagnosis Date Noted  . Ischemic leg 04/25/2015  . Blood in stool   . Benign neoplasm of rectosigmoid junction   . History of colonic polyps 04/11/2015  . H/O acute myocardial infarction 04/11/2015  . H/O hypercholesterolemia 04/11/2015  . H/O disease 04/11/2015  . Hemorrhoids, internal 04/11/2015  . Hypertension 04/11/2015  . Heart disease 04/11/2015  . Hematochezia 02/09/2015  . Acute post-hemorrhagic anemia 02/09/2015  . Blood in feces 02/09/2015  . Acute  blood loss anemia 02/09/2015  . Necrotizing pneumonia ([place]) 01/22/2015  . Abscess of lung ([place]) 01/22/2015      Prior to Admission medications   Medication Sig Start Date End Date Taking? Authorizing Provider  apixaban (ELIQUIS) 2.5 MG TABS tablet Take 1 tablet (2.5 mg total) by mouth 2 (two) times daily. 04/29/15   Algernon Huxley, MD  aspirin EC 81 MG EC tablet Take 1 tablet (81 mg total) by mouth  daily. 04/29/15   Algernon Huxley, MD  atorvastatin (LIPITOR) 80 MG tablet Take 80 mg by mouth every morning.  11/15/14   Historical Provider, MD  budesonide-formoterol (SYMBICORT) 80-4.5 MCG/ACT inhaler Inhale 2 puffs into the lungs daily.    Historical Provider, MD  cetirizine (ZYRTEC) 10 MG tablet Take 10 mg by mouth daily.    Historical Provider, MD  Cholecalciferol (VITAMIN D3) 2000 units TABS Take 1 tablet by mouth daily.    Historical Provider, MD  feeding supplement, ENSURE ENLIVE, (ENSURE ENLIVE) LIQD Take 237 mLs by mouth 3 (three) times daily with meals. 02/03/15   Epifanio Lesches, MD  ferrous sulfate 325 (65 FE) MG tablet Take 325 mg by mouth daily.    Historical Provider, MD  gabapentin (NEURONTIN) 300 MG capsule Take 1 capsule (300 mg total) by mouth 3 (three) times daily as needed (Nerve Pain). 04/29/15   Algernon Huxley, MD  HYDROcodone-acetaminophen (NORCO/VICODIN) 5-325 MG per tablet Take 1-2 tablets by mouth every 4 (four) hours as needed for moderate pain. 04/29/15   Algernon Huxley, MD  hydrocortisone (PROCTOSOL HC) 2.5 % rectal cream Place 1 application rectally 2 (two) times daily. 02/04/15   Epifanio Lesches, MD  losartan (COZAAR) 50 MG tablet Take 50 mg by mouth daily. 12/09/14   Historical Provider, MD  metoprolol (LOPRESSOR) 50 MG tablet Take 100 mg by mouth daily. 01/19/15   Historical Provider, MD  pregabalin (LYRICA) 75 MG capsule Take 1 capsule (75 mg total) by mouth 2 (two) times daily. 04/29/15   Algernon Huxley, MD    Allergies Aspirin and Zyrtec [cetirizine]    Social History Social History  Substance Use Topics  . Smoking status: Current Every Day Smoker    Packs/day: 1.00    Years: 41.00    Types: Cigarettes  . Smokeless tobacco: Never Used     Comment: pt recommend to stop smoking 4 min spent will start on prn nicotine replacment  . Alcohol use 0.0 oz/week     Comment: weekends, couple beers, pint of liquer only on weekends    Review of Systems Patient denies  headaches, rhinorrhea, blurry vision, numbness, shortness of breath, chest pain, edema, cough, abdominal pain, nausea, vomiting, diarrhea, dysuria, fevers, rashes or hallucinations unless otherwise stated above in HPI. ____________________________________________   PHYSICAL EXAM:  VITAL SIGNS: Vitals:   10/04/16 2002  BP: (!) 150/80  Pulse: 94  Resp: 18  Temp: 98.3 F (36.8 C)    Constitutional: Alert and oriented.  in no acute distress. Eyes: Conjunctivae are normal. PERRL. EOMI. Head: Atraumatic. Nose: No congestion/rhinnorhea. Mouth/Throat: Mucous membranes are moist.  Oropharynx non-erythematous. Neck: No stridor. Painless ROM. No cervical spine tenderness to palpation Hematological/Lymphatic/Immunilogical: No cervical lymphadenopathy. Cardiovascular: Normal rate, regular rhythm. Grossly normal heart sounds.  Good peripheral circulation. Respiratory: Normal respiratory effort.  No retractions. Lungs CTAB. Gastrointestinal: Soft and nontender. No distention. No abdominal bruits. No CVA tenderness. Musculoskeletal: No lower extremity tenderness nor edema.  No joint effusions. Neurologic: CN- intact.  No facial droop, Normal  FNF.  Normal heel to shin.  Sensation intact bilaterally. Normal speech and language. No gross focal neurologic deficits are appreciated. No gait instability.  Skin:  Skin is warm, dry and intact. No rash noted. Psychiatric: Mood and affect are normal. Speech and behavior are normal.  ____________________________________________   LABS (all labs ordered are listed, but only abnormal results are displayed)  No results found for this or any previous visit (from the past 24 hour(s)). ____________________________________________  EKG____________________________________________  RADIOLOGY  I personally reviewed all radiographic images ordered to evaluate for the above acute complaints and reviewed radiology reports and findings.  These findings were  personally discussed with the patient.  Please see medical record for radiology report.  ____________________________________________   PROCEDURES  Procedure(s) performed:  Procedures    Critical Care performed: no ____________________________________________   INITIAL IMPRESSION / ASSESSMENT AND PLAN / ED COURSE  Pertinent labs & imaging results that were available during my care of the patient were reviewed by me and considered in my medical decision making (see chart for details).  DDX:  Migraine, tension, cluster, sah, sinusitis, gca,     [name] is a 61 y.o. who presents to the ED with with Hx of migraines p/w HA for last 3hours. Not worst HA ever. Gradual onset. HA similar to previous episodes. Denies focal neurologic symptoms. Denies trauma. No fevers or neck pain. No vision loss. Afebrile in ED. VSS. Exam as above. No meningeal signs. No CN, motor, sensory or cerebellar deficits. Temporal arteries palpable and non-tender. Appears well and non-toxic.  Will provide IV fluids for hydration and IV medications for symptom control.  CT head ordered due to concern for [facility] or sinusitis shows no AICA.  Likely tension, non-specific or possible migraine HA. Clinical picture is not consistent with ICH, SAH, SDH, EDH, TIA, or CVA. No concern for meningitis or encephalitis. No concern for GCA/Temporal arteritis.  ----------------------------------------- 12:26 AM on 10/05/2016 -----------------------------------------   Pain improved. Repeat neuro exam is again without focal deficit, nuchal rigidity or evidence of meningeal irritation.  Stable to D/C home, follow up with PCP or Neurology if persistent recurrent Has.  Have discussed with the patient and available family all diagnostics and treatments performed thus far and all questions were answered to the best of my ability. The patient demonstrates understanding and agreement with plan.         ____________________________________________   FINAL CLINICAL IMPRESSION(S) / ED DIAGNOSES  Final diagnoses:  Bad headache      NEW MEDICATIONS STARTED DURING THIS VISIT:  New Prescriptions   No medications on file     Note:  This document was prepared using Dragon voice recognition software and may include unintentional dictation errors.    Merlyn Lot, MD 10/05/16 [phone]

## 2016-10-04 NOTE — ED Triage Notes (Signed)
Per EMS report, patient c/o HA for 2 hours prior to arrival. Patient is light sensitive.

## 2016-10-04 NOTE — ED Triage Notes (Addendum)
Pt to triage via w/c with no distress noted; pt reports left sided HA for last 2-3hrs; st hx migraines and sinus problems and "feels same"; pt st pain accomp by photosensitivity

## 2016-10-24 DIAGNOSIS — J449 Chronic obstructive pulmonary disease, unspecified: Secondary | ICD-10-CM | POA: Diagnosis not present

## 2016-10-24 DIAGNOSIS — R51 Headache: Secondary | ICD-10-CM | POA: Diagnosis not present

## 2016-10-24 DIAGNOSIS — I10 Essential (primary) hypertension: Secondary | ICD-10-CM | POA: Diagnosis not present

## 2016-10-24 DIAGNOSIS — I6523 Occlusion and stenosis of bilateral carotid arteries: Secondary | ICD-10-CM | POA: Diagnosis not present

## 2016-11-06 DIAGNOSIS — R0602 Shortness of breath: Secondary | ICD-10-CM | POA: Diagnosis not present

## 2016-11-06 DIAGNOSIS — I739 Peripheral vascular disease, unspecified: Secondary | ICD-10-CM | POA: Diagnosis not present

## 2016-11-06 DIAGNOSIS — I340 Nonrheumatic mitral (valve) insufficiency: Secondary | ICD-10-CM | POA: Diagnosis not present

## 2016-11-06 DIAGNOSIS — I2510 Atherosclerotic heart disease of native coronary artery without angina pectoris: Secondary | ICD-10-CM | POA: Diagnosis not present

## 2016-11-09 ENCOUNTER — Ambulatory Visit (INDEPENDENT_AMBULATORY_CARE_PROVIDER_SITE_OTHER): Payer: Medicare Other

## 2016-11-09 ENCOUNTER — Encounter (INDEPENDENT_AMBULATORY_CARE_PROVIDER_SITE_OTHER): Payer: Self-pay | Admitting: Vascular Surgery

## 2016-11-09 ENCOUNTER — Ambulatory Visit (INDEPENDENT_AMBULATORY_CARE_PROVIDER_SITE_OTHER): Payer: Medicare Other | Admitting: Vascular Surgery

## 2016-11-09 ENCOUNTER — Other Ambulatory Visit (INDEPENDENT_AMBULATORY_CARE_PROVIDER_SITE_OTHER): Payer: Self-pay | Admitting: Vascular Surgery

## 2016-11-09 VITALS — BP 176/94 | HR 72 | Resp 16 | Wt 134.0 lb

## 2016-11-09 DIAGNOSIS — I10 Essential (primary) hypertension: Secondary | ICD-10-CM | POA: Diagnosis not present

## 2016-11-09 DIAGNOSIS — F17200 Nicotine dependence, unspecified, uncomplicated: Secondary | ICD-10-CM | POA: Insufficient documentation

## 2016-11-09 DIAGNOSIS — F17210 Nicotine dependence, cigarettes, uncomplicated: Secondary | ICD-10-CM | POA: Diagnosis not present

## 2016-11-09 DIAGNOSIS — Z959 Presence of cardiac and vascular implant and graft, unspecified: Secondary | ICD-10-CM

## 2016-11-09 DIAGNOSIS — I739 Peripheral vascular disease, unspecified: Secondary | ICD-10-CM

## 2016-11-09 DIAGNOSIS — Z95820 Peripheral vascular angioplasty status with implants and grafts: Secondary | ICD-10-CM

## 2016-11-09 DIAGNOSIS — I70219 Atherosclerosis of native arteries of extremities with intermittent claudication, unspecified extremity: Secondary | ICD-10-CM | POA: Insufficient documentation

## 2016-11-09 DIAGNOSIS — I70213 Atherosclerosis of native arteries of extremities with intermittent claudication, bilateral legs: Secondary | ICD-10-CM

## 2016-11-09 NOTE — Progress Notes (Signed)
MRN : [id number]  [name] is a 61 y.o. (1956/03/19) female who presents with chief complaint of  Chief Complaint  Patient presents with  . Follow-up  .  History of Present Illness: Patient returns today in follow up of Peripheral arterial disease. She reports some increase in her claudication symptoms a little worse on the left than the right. These are still mild to moderate and not currently lifestyle limiting. She does not have ulceration or infection. She continues to smoke. Her noninvasive studies today show a right ABI of 0.89 and a left ABI of 0.72. There is dilatation with some lower flow velocities in the proximal left superficial femoral artery, but this correlates with an area of previous endarterectomy site and dilatation is not surprising as a patch angioplasty was performed. No obvious focal stenosis is identified on duplex.  Current Outpatient Prescriptions  Medication Sig Dispense Refill  . aspirin EC 81 MG EC tablet Take 1 tablet (81 mg total) by mouth daily. 90 tablet 1  . atorvastatin (LIPITOR) 80 MG tablet Take 80 mg by mouth every morning.     . budesonide-formoterol (SYMBICORT) 80-4.5 MCG/ACT inhaler Inhale 2 puffs into the lungs daily.    . cetirizine (ZYRTEC) 10 MG tablet Take 10 mg by mouth daily.    . Cholecalciferol (VITAMIN D3) 2000 units TABS Take 1 tablet by mouth daily.    . ferrous sulfate 325 (65 FE) MG tablet Take 325 mg by mouth daily.    Marland Kitchen losartan (COZAAR) 50 MG tablet Take 50 mg by mouth daily.    Marland Kitchen apixaban (ELIQUIS) 2.5 MG TABS tablet Take 1 tablet (2.5 mg total) by mouth 2 (two) times daily. (Patient not taking: Reported on 11/09/2016) 60 tablet 2  . feeding supplement, ENSURE ENLIVE, (ENSURE ENLIVE) LIQD Take 237 mLs by mouth 3 (three) times daily with meals. (Patient not taking: Reported on 11/09/2016) 237 mL 12  . gabapentin (NEURONTIN) 300 MG capsule Take 1 capsule (300 mg total) by mouth 3 (three) times daily as needed (Nerve Pain). (Patient  not taking: Reported on 11/09/2016) 60 capsule 1  . HYDROcodone-acetaminophen (NORCO/VICODIN) 5-325 MG per tablet Take 1-2 tablets by mouth every 4 (four) hours as needed for moderate pain. (Patient not taking: Reported on 11/09/2016) 30 tablet 0  . hydrocortisone (PROCTOSOL HC) 2.5 % rectal cream Place 1 application rectally 2 (two) times daily. (Patient not taking: Reported on 11/09/2016) 30 g 0  . metoprolol (LOPRESSOR) 50 MG tablet Take 100 mg by mouth daily.    . pregabalin (LYRICA) 75 MG capsule Take 1 capsule (75 mg total) by mouth 2 (two) times daily. (Patient not taking: Reported on 11/09/2016) 60 capsule 0   No current facility-administered medications for this visit.     Past Medical History:  Diagnosis Date  . Arthritis   . Asthma   . Carotid artery stenosis   . COPD (chronic obstructive pulmonary disease) ([place])   . Coronary artery disease   . Hemorrhoids   . Hyperlipidemia   . Hypertension   . 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negative cardiac cath  . Nicotine addiction   . Peripheral vascular disease ([place])   . Pneumonia   . Shortness of breath dyspnea     Past Surgical History:  Procedure Laterality Date  . ABDOMINAL HYSTERECTOMY    . APPENDECTOMY    . CARDIAC CATHETERIZATION    . CARDIAC SURGERY    . COLONOSCOPY  12/15/11   OH->bleeding internal hemorrhoids, otherwise normal  .  COLONOSCOPY WITH PROPOFOL N/A 04/12/2015   Procedure: COLONOSCOPY WITH PROPOFOL;  Surgeon: Lucilla Lame, MD;  Location: [facility] ENDOSCOPY;  Service: Endoscopy;  Laterality: N/A;  . ENDARTERECTOMY FEMORAL Left 04/26/2015   Procedure: ENDARTERECTOMY FEMORAL;  Surgeon: Algernon Huxley, MD;  Location: [facility] ORS;  Service: Vascular;  Laterality: Left;  . ENDOVASCULAR STENT INSERTION Bilateral    Legs  . FLEXIBLE BRONCHOSCOPY N/A 02/02/2015   Procedure: FLEXIBLE BRONCHOSCOPY;  Surgeon: Allyne Gee, MD;  Location: [facility] ORS;  Service: Pulmonary;  Laterality: N/A;  . HEMORRHOID SURGERY    . PERIPHERAL VASCULAR CATHETERIZATION  Left 04/25/2015   Procedure: Lower Extremity Angiography;  Surgeon: Algernon Huxley, MD;  Location: [place] [facility];  Service: Cardiovascular;  Laterality: Left;  . PERIPHERAL VASCULAR CATHETERIZATION Left 04/25/2015   Procedure: Lower Extremity Intervention;  Surgeon: Algernon Huxley, MD;  Location: [place] [facility];  Service: Cardiovascular;  Laterality: Left;  . PERIPHERAL VASCULAR CATHETERIZATION Left 01/02/2016   Procedure: Lower Extremity Angiography;  Surgeon: Algernon Huxley, MD;  Location: [place] [facility];  Service: Cardiovascular;  Laterality: Left;  . PERIPHERAL VASCULAR CATHETERIZATION  01/02/2016   Procedure: Lower Extremity Intervention;  Surgeon: Algernon Huxley, MD;  Location: [place] [facility];  Service: Cardiovascular;;  . THROMBECTOMY FEMORAL ARTERY Left 04/26/2015   Procedure: THROMBECTOMY FEMORAL ARTERY;  Surgeon: Algernon Huxley, MD;  Location: [facility] OR[facility];  Service: Vascular;  Laterality: Left;    Social History Social History  Substance Use Topics  . Smoking status: Current Every Day Smoker    Packs/day: 1.00    Years: 41.00    Types: Cigarettes  . Smokeless tobacco: Never Used     Comment: pt recommend to stop smoking 4 min spent will start on prn nicotine replacment  . Alcohol use 0.0 oz/week     Comment: weekends, couple beers, pint of liquer only on weekends     Family History Family History  Problem Relation Age of Onset  . Hypertension Mother   . Hypertension Father   . Breast cancer Neg Hx     Allergies  Allergen Reactions  . Aspirin Nausea And Vomiting and Other (See Comments)    Pt states aspirin makes her cramp and have to use coated kind.  Alethia Berthold [Cetirizine]      REVIEW OF SYSTEMS (Negative unless checked)  Constitutional: [] Weight loss  [] Fever  [] Chills Cardiac: [] Chest pain   [] Chest pressure   [] Palpitations   [] Shortness of breath when laying flat   [] Shortness of breath at rest   [x] Shortness of breath with exertion. Vascular:  [x] Pain in  legs with walking   [] Pain in legs at rest   [] Pain in legs when laying flat   [x] Claudication   [] Pain in feet when walking  [] Pain in feet at rest  [] Pain in feet when laying flat   [] History of DVT   [] Phlebitis   [x] Swelling in legs   [] Varicose veins   [] Non-healing ulcers Pulmonary:   [] Uses home oxygen   [] Productive cough   [] Hemoptysis   [] Wheeze  [] COPD   [] Asthma Neurologic:  [] Dizziness  [] Blackouts   [] Seizures   [] History of stroke   [] History of TIA  [] Aphasia   [] Temporary blindness   [] Dysphagia   [] Weakness or numbness in arms   [] Weakness or numbness in legs Musculoskeletal:  [] Arthritis   [] Joint swelling   [] Joint pain   [] Low back pain Hematologic:  [] Easy bruising  [] Easy bleeding   [] Hypercoagulable state   []   Anemic   Gastrointestinal:  [] Blood in stool   [] Vomiting blood  [] Gastroesophageal reflux/heartburn   [] Abdominal pain Genitourinary:  [] Chronic kidney disease   [] Difficult urination  [] Frequent urination  [] Burning with urination   [] Hematuria Skin:  [] Rashes   [] Ulcers   [] Wounds Psychological:  [] History of anxiety   []  History of major depression.  Physical Examination  BP (!) 176/94   Pulse 72   Resp 16   Wt 134 lb (60.8 kg)   LMP  (LMP Unknown)   BMI 23.00 kg/m  Gen:  WD/WN, NAD Head: [place]/AT, No temporalis wasting. Ear/Nose/Throat: Hearing grossly intact, nares w/o erythema or drainage, trachea midline Eyes: Conjunctiva clear. Sclera non-icteric Neck: Supple.  No JVD.  Pulmonary:  Good air movement, no use of accessory muscles.  Cardiac: RRR, normal S1, S2 Vascular:  Vessel Right Left  Radial Palpable Palpable  Ulnar Palpable Palpable  Brachial Palpable Palpable  Carotid Palpable, without bruit Palpable, without bruit  Aorta Not palpable N/A  Femoral Palpable Palpable  Popliteal Palpable 1+ Palpable  PT Palpable 1+ Palpable  DP 1+ Palpable 1+ Palpable   Gastrointestinal: soft, non-tender/non-distended. No guarding/reflex.  Musculoskeletal:  M/S 5/5 throughout.  No deformity or atrophy. 1+ left lower extremity edema. Neurologic: Sensation grossly intact in extremities.  Symmetrical.  Speech is fluent.  Psychiatric: Judgment intact, Mood & affect appropriate for pt's clinical situation. Dermatologic: No rashes or ulcers noted.  No cellulitis or open wounds. Lymph : No Cervical, Axillary, or Inguinal lymphadenopathy.      Labs Recent Results (from the past 2160 hour(s))  VAS [place] LOWER EXTREMITY ARTERIAL DUPLEX     Status: None (In process)   Collection Time: 11/09/16 11:14 AM  Result Value Ref Range   Left super femoral prox sys PSV -24 cm/s   Left super femoral mid sys PSV -37 cm/s   Left super femoral dist sys PSV -38 cm/s   Left popliteal prox sys PSV 30 cm/s   Left popliteal dist sys PSV -67 cm/s   Right peroneal sys min 0 m/s   Right super femoral prox sys PSV 92 cm/s   Right super femoral mid sys PSV -91 cm/s   Right super femoral dist sys PSV -96 cm/s   Right popliteal prox sys PSV 72 cm/s   Right popliteal dist sys PSV -70 cm/s   Right peroneal sys PSV -49 cm/s   Left ant tibial distal sys 191 cm/s   left ant tibial distal dia 0 cm/s   left post tibial dist sys 26 cm/s   left post tibial dist dia 0 cm/s   LEFT PERO DIST DIA 0 cm/s   LEFT PERO DIST SYS -112.00 cm/s   RIGHT ANT DIST TIBAL SYS PSV 74 cm/s   RIGHT ANT DIST TIBAL DIA PSV 1 cm/s   RIGHT POST TIB DIST SYS 76 cm/s   RIGHT POST TIB DIST DIA 0 cm/s   LEFT SFA PROX DYS VEL 0 cm/s   LEFT SFA MID VEL 0 cm/s   LEFT SFA DIST DYS VEL 0 cm/s   LEFT POPLITEAL PROX DYS VEL 0 cm/s   LEFT POPLITEAL DIST DYS VEL 0 cm/s   RIGHT SUPER FEMORAL PROX EDV 0 cm/sec   RIGHT SUPER FEMORAL MID EDV 0 cm/sec   RIGHT SUPER FEMORAL DIST EDV 0 cm/sec   RIGHT POPLITEAL PROX EDV 1 cm/sec   RIGHT POPLITEAL DIST EDV 0 cm/sec    Radiology No results found.    Assessment/Plan  Hypertension blood  pressure control important in reducing the progression of atherosclerotic  disease. On appropriate oral medications.   Tobacco use disorder We had a discussion for approximately 3 minutes regarding the absolute need for smoking cessation due to the deleterious nature of tobacco on the vascular system. We discussed the tobacco use would diminish patency of any intervention, and likely significantly worsen progressio of disease. We discussed multiple agents for quitting including replacement therapy or medications to reduce cravings such as Chantix. The patient voices their understanding of the importance of smoking cessation.   Atherosclerosis of native arteries of extremity with intermittent claudication ([place]) Her noninvasive studies today show a right ABI of 0.89 and a left ABI of 0.72. There is dilatation with some lower flow velocities in the proximal left superficial femoral artery, but this correlates with an area of previous endarterectomy site and dilatation is not surprising as a patch angioplasty was performed. No obvious focal stenosis is identified on duplex. Her symptoms would not warrant further intervention at this point.  Smoking cessation recommended.  RTC six months with studies.    Leotis Pain, MD  11/09/2016 1:03 PM    This note was created with Dragon medical transcription system.  Any errors from dictation are purely unintentional

## 2016-11-09 NOTE — Assessment & Plan Note (Signed)
Her noninvasive studies today show a right ABI of 0.89 and a left ABI of 0.72. There is dilatation with some lower flow velocities in the proximal left superficial femoral artery, but this correlates with an area of previous endarterectomy site and dilatation is not surprising as a patch angioplasty was performed. No obvious focal stenosis is identified on duplex. Her symptoms would not warrant further intervention at this point.  Smoking cessation recommended.  RTC six months with studies.

## 2016-11-09 NOTE — Assessment & Plan Note (Signed)

## 2016-11-09 NOTE — Assessment & Plan Note (Signed)
blood pressure control important in reducing the progression of atherosclerotic disease. On appropriate oral medications.  

## 2017-01-14 DIAGNOSIS — I10 Essential (primary) hypertension: Secondary | ICD-10-CM | POA: Diagnosis not present

## 2017-01-14 DIAGNOSIS — J449 Chronic obstructive pulmonary disease, unspecified: Secondary | ICD-10-CM | POA: Diagnosis not present

## 2017-01-14 DIAGNOSIS — E784 Other hyperlipidemia: Secondary | ICD-10-CM | POA: Diagnosis not present

## 2017-01-31 DIAGNOSIS — R911 Solitary pulmonary nodule: Secondary | ICD-10-CM | POA: Diagnosis not present

## 2017-02-07 DIAGNOSIS — I2510 Atherosclerotic heart disease of native coronary artery without angina pectoris: Secondary | ICD-10-CM | POA: Diagnosis not present

## 2017-02-07 DIAGNOSIS — Z720 Tobacco use: Secondary | ICD-10-CM | POA: Diagnosis not present

## 2017-02-07 DIAGNOSIS — I10 Essential (primary) hypertension: Secondary | ICD-10-CM | POA: Diagnosis not present

## 2017-02-19 ENCOUNTER — Encounter: Payer: Self-pay | Admitting: Emergency Medicine

## 2017-02-19 ENCOUNTER — Emergency Department
Admission: EM | Admit: 2017-02-19 | Discharge: 2017-02-19 | Disposition: A | Discharge status: Home / Self Care | Payer: Medicare Other | Attending: Emergency Medicine | Admitting: Emergency Medicine

## 2017-02-19 DIAGNOSIS — Z7982 Long term (current) use of aspirin: Secondary | ICD-10-CM | POA: Diagnosis not present

## 2017-02-19 DIAGNOSIS — I10 Essential (primary) hypertension: Secondary | ICD-10-CM | POA: Insufficient documentation

## 2017-02-19 DIAGNOSIS — T63441A Toxic effect of venom of bees, accidental (unintentional), initial encounter: Secondary | ICD-10-CM | POA: Diagnosis not present

## 2017-02-19 DIAGNOSIS — J449 Chronic obstructive pulmonary disease, unspecified: Secondary | ICD-10-CM | POA: Insufficient documentation

## 2017-02-19 DIAGNOSIS — J45909 Unspecified asthma, uncomplicated: Secondary | ICD-10-CM | POA: Insufficient documentation

## 2017-02-19 DIAGNOSIS — Z79899 Other long term (current) drug therapy: Secondary | ICD-10-CM | POA: Diagnosis not present

## 2017-02-19 DIAGNOSIS — F17210 Nicotine dependence, cigarettes, uncomplicated: Secondary | ICD-10-CM | POA: Diagnosis not present

## 2017-02-19 DIAGNOSIS — R2231 Localized swelling, mass and lump, right upper limb: Secondary | ICD-10-CM | POA: Diagnosis not present

## 2017-02-19 DIAGNOSIS — I2510 Atherosclerotic heart disease of native coronary artery without angina pectoris: Secondary | ICD-10-CM | POA: Insufficient documentation

## 2017-02-19 MED ORDER — FAMOTIDINE 20 MG PO TABS: 20.0000 mg | ORAL_TABLET | Freq: Every day | ORAL | 0 refills | Status: DC

## 2017-02-19 MED ORDER — FAMOTIDINE 20 MG PO TABS
40.0000 mg | ORAL_TABLET | Freq: Once | ORAL | Status: AC
  Administered 2017-02-19: 40 mg via ORAL
  Filled 2017-02-19: qty 2

## 2017-02-19 MED ORDER — EPINEPHRINE 0.3 MG/0.3ML IJ SOAJ: 0.3000 mg | Freq: Once | INTRAMUSCULAR | 0 refills | Status: AC

## 2017-02-19 MED ORDER — PREDNISONE 50 MG PO TABS: 50.0000 mg | ORAL_TABLET | Freq: Every day | ORAL | 0 refills | Status: DC

## 2017-02-19 MED ORDER — METHYLPREDNISOLONE SODIUM SUCC 125 MG IJ SOLR
125.0000 mg | Freq: Once | INTRAMUSCULAR | Status: AC
  Administered 2017-02-19: 125 mg via INTRAMUSCULAR
  Filled 2017-02-19: qty 2

## 2017-02-19 NOTE — ED Provider Notes (Signed)
[facility] Emergency Department Provider Note  ____________________________________________  Time seen: Approximately 3:19 PM  I have reviewed the triage vital signs and the nursing notes.   HISTORY  Chief Complaint Insect Bite    HPI [name] is a 61 y.o. female who presents emergency Department with a insect bite/sting to the right hand. 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 Patient reports that on both of her previous anaphylactic reactions to bite/sting the reaction has been delayed for 2-3 days before onset of true anaphylaxis. Patient has been hospitalized for anaphylaxis in the past. Patient reports that she has itching and swelling to the right hand. She reports that the swelling is starting to a send through an upper wrist. Patient denies any loss of sensation or movement of any of the digits of her right hand. She denies any wheezing, shortness of breath, angioedema sensations. Patient has been prescribed an EpiPen in the past, but has had to use her EpiPen for previous reaction and does not have one at this time. She is also highly allergic to Benadryl. Patient reports that she has had steroids and famotidine in the past for allergic reactions. No other complaints at this time.   Past Medical History:  Diagnosis Date  . Arthritis   . Asthma   . Carotid artery stenosis   . COPD (chronic obstructive pulmonary disease) ([place])   . Coronary artery disease   . Hemorrhoids   . Hyperlipidemia   . Hypertension   . 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[facility])    negative cardiac cath  . Nicotine addiction   . Peripheral vascular disease ([place])   . Pneumonia   . Shortness of breath dyspnea     Patient Active Problem List   Diagnosis Date Noted  . Tobacco use disorder 11/09/2016  . Atherosclerosis of native arteries of extremity with intermittent claudication ([place]) 11/09/2016  . Ischemic leg 04/25/2015  . Blood in stool   . Benign  neoplasm of rectosigmoid junction   . History of colonic polyps 04/11/2015  . H/O acute myocardial infarction 04/11/2015  . H/O hypercholesterolemia 04/11/2015  . H/O disease 04/11/2015  . Hemorrhoids, internal 04/11/2015  . Hypertension 04/11/2015  . Heart disease 04/11/2015  . Hematochezia 02/09/2015  . Acute post-hemorrhagic anemia 02/09/2015  . Blood in feces 02/09/2015  . Acute blood loss anemia 02/09/2015  . Necrotizing pneumonia ([place]) 01/22/2015  . Abscess of lung ([place]) 01/22/2015    Past Surgical History:  Procedure Laterality Date  . ABDOMINAL HYSTERECTOMY    . APPENDECTOMY    . CARDIAC CATHETERIZATION    . CARDIAC SURGERY    . COLONOSCOPY  12/15/11   OH->bleeding internal hemorrhoids, otherwise normal  . COLONOSCOPY WITH PROPOFOL N/A 04/12/2015   Procedure: COLONOSCOPY WITH PROPOFOL;  Surgeon: Lucilla Lame, MD;  Location: [facility] ENDOSCOPY;  Service: Endoscopy;  Laterality: N/A;  . ENDARTERECTOMY FEMORAL Left 04/26/2015   Procedure: ENDARTERECTOMY FEMORAL;  Surgeon: Algernon Huxley, MD;  Location: [facility] ORS;  Service: Vascular;  Laterality: Left;  . ENDOVASCULAR STENT INSERTION Bilateral    Legs  . FLEXIBLE BRONCHOSCOPY N/A 02/02/2015   Procedure: FLEXIBLE BRONCHOSCOPY;  Surgeon: Allyne Gee, MD;  Location: [facility] ORS;  Service: Pulmonary;  Laterality: N/A;  . HEMORRHOID SURGERY    . PERIPHERAL VASCULAR CATHETERIZATION Left 04/25/2015   Procedure: Lower Extremity Angiography;  Surgeon: Algernon Huxley, MD;  Location: [place] [facility];  Service: Cardiovascular;  Laterality: Left;  . PERIPHERAL VASCULAR CATHETERIZATION Left 04/25/2015  Procedure: Lower Extremity Intervention;  Surgeon: Algernon Huxley, MD;  Location: [place] [facility];  Service: Cardiovascular;  Laterality: Left;  . PERIPHERAL VASCULAR CATHETERIZATION Left 01/02/2016   Procedure: Lower Extremity Angiography;  Surgeon: Algernon Huxley, MD;  Location: [place] [facility];  Service: Cardiovascular;  Laterality: Left;  .  PERIPHERAL VASCULAR CATHETERIZATION  01/02/2016   Procedure: Lower Extremity Intervention;  Surgeon: Algernon Huxley, MD;  Location: [place] [facility];  Service: Cardiovascular;;  . THROMBECTOMY FEMORAL ARTERY Left 04/26/2015   Procedure: THROMBECTOMY FEMORAL ARTERY;  Surgeon: Algernon Huxley, MD;  Location: [facility] ORS;  Service: Vascular;  Laterality: Left;    Prior to Admission medications   Medication Sig Start Date End Date Taking? Authorizing Provider  apixaban (ELIQUIS) 2.5 MG TABS tablet Take 1 tablet (2.5 mg total) by mouth 2 (two) times daily. Patient not taking: Reported on 11/09/2016 04/29/15   Algernon Huxley, MD  aspirin EC 81 MG EC tablet Take 1 tablet (81 mg total) by mouth daily. 04/29/15   Algernon Huxley, MD  atorvastatin (LIPITOR) 80 MG tablet Take 80 mg by mouth every morning.  11/15/14   [provider]  budesonide-formoterol (SYMBICORT) 80-4.5 MCG/ACT inhaler Inhale 2 puffs into the lungs daily.    [provider]  cetirizine (ZYRTEC) 10 MG tablet Take 10 mg by mouth daily.    [provider]  Cholecalciferol (VITAMIN D3) 2000 units TABS Take 1 tablet by mouth daily.    [provider]  EPINEPHrine 0.3 mg/0.3 mL IJ SOAJ injection Inject 0.3 mLs (0.3 mg total) into the muscle once. 02/19/17 02/19/17  Keven Osborn, Charline Bills, PA-C  famotidine (PEPCID) 20 MG tablet Take 1 tablet (20 mg total) by mouth daily. 02/19/17 02/19/18  Porchia Sinkler, Charline Bills, PA-C  feeding supplement, ENSURE ENLIVE, (ENSURE ENLIVE) LIQD Take 237 mLs by mouth 3 (three) times daily with meals. Patient not taking: Reported on 11/09/2016 02/03/15   Epifanio Lesches, MD  ferrous sulfate 325 (65 FE) MG tablet Take 325 mg by mouth daily.    [provider]  gabapentin (NEURONTIN) 300 MG capsule Take 1 capsule (300 mg total) by mouth 3 (three) times daily as needed (Nerve Pain). Patient not taking: Reported on 11/09/2016 04/29/15   Algernon Huxley, MD  HYDROcodone-acetaminophen (NORCO/VICODIN)  5-325 MG per tablet Take 1-2 tablets by mouth every 4 (four) hours as needed for moderate pain. Patient not taking: Reported on 11/09/2016 04/29/15   Algernon Huxley, MD  hydrocortisone (PROCTOSOL HC) 2.5 % rectal cream Place 1 application rectally 2 (two) times daily. Patient not taking: Reported on 11/09/2016 02/04/15   Epifanio Lesches, MD  losartan (COZAAR) 50 MG tablet Take 50 mg by mouth daily. 12/09/14   [provider]  metoprolol (LOPRESSOR) 50 MG tablet Take 100 mg by mouth daily. 01/19/15   [provider]  predniSONE (DELTASONE) 50 MG tablet Take 1 tablet (50 mg total) by mouth daily with breakfast. 02/19/17   Careli Luzader, Charline Bills, PA-C  pregabalin (LYRICA) 75 MG capsule Take 1 capsule (75 mg total) by mouth 2 (two) times daily. Patient not taking: Reported on 11/09/2016 04/29/15   Algernon Huxley, MD    Allergies Aspirin and Zyrtec [cetirizine]  Family History  Problem Relation Age of Onset  . Hypertension Mother   . Hypertension Father   . Breast cancer Neg Hx     Social History Social History  Substance Use Topics  . Smoking status: Current Every Day Smoker  Packs/day: 1.00    Years: 41.00    Types: Cigarettes  . Smokeless tobacco: Never Used     Comment: pt recommend to stop smoking 4 min spent will start on prn nicotine replacment  . Alcohol use 0.0 oz/week     Comment: weekends, couple beers, pint of liquer only on weekends     Review of Systems  Constitutional: No fever/chills Eyes: No visual changes.  ENT: No upper respiratory complaints. Cardiovascular: no chest pain. Respiratory: no cough. No SOB. Gastrointestinal: No abdominal pain.  No nausea, no vomiting.  Musculoskeletal: Negative for musculoskeletal pain. Skin: Positive for pruritus and edema to the right hand Neurological: Negative for headaches, focal weakness or numbness. 10-point ROS otherwise negative.  ____________________________________________   PHYSICAL EXAM:  VITAL  SIGNS: ED Triage Vitals [02/19/17 1446]  Enc Vitals Group     BP (!) 142/84     Pulse Rate 92     Resp 16     Temp 98.3 F (36.8 C)     Temp Source Oral     SpO2 99 %     Weight 133 lb (60.3 kg)     Height 5' (1.524 m)     Head Circumference      Peak Flow      Pain Score 0     Pain Loc      Pain Edu?      Excl. in [place]?      Constitutional: Alert and oriented. Well appearing and in no acute distress. Eyes: Conjunctivae are normal. PERRL. EOMI. Head: Atraumatic. ENT:      Ears:       Nose: No congestion/rhinnorhea.      Mouth/Throat: Mucous membranes are moist.  Neck: No stridor.    Cardiovascular: Normal rate, regular rhythm. Normal S1 and S2.  Good peripheral circulation. Respiratory: Normal respiratory effort without tachypnea or retractions. Lungs CTAB. Good air entry to the bases with no decreased or absent breath sounds. Musculoskeletal: Full range of motion to all extremities. No gross deformities appreciated. Neurologic:  Normal speech and language. No gross focal neurologic deficits are appreciated.  Skin:  Skin is warm, dry and intact. No rash noted.Right hand is grossly edematous from MCP joint through the wrist. 2 small papules noted to the posterior aspect of the right hand over the carpal bones. No bite/sting mark appreciated. Full range of motion all digits right hand. Sensation and cap Refill intact 5 digits. Psychiatric: Mood and affect are normal. Speech and behavior are normal. Patient exhibits appropriate insight and judgement.   ____________________________________________   LABS (all labs ordered are listed, but only abnormal results are displayed)  Labs Reviewed - No data to display ____________________________________________  EKG   ____________________________________________  RADIOLOGY   No results found.  ____________________________________________    PROCEDURES  Procedure(s) performed:    Procedures    Medications   methylPREDNISolone sodium succinate (SOLU-MEDROL) 125 mg/2 mL injection 125 mg (125 mg Intramuscular Given 02/19/17 1615)  famotidine (PEPCID) tablet 40 mg (40 mg Oral Given 02/19/17 1615)     ____________________________________________   INITIAL IMPRESSION / ASSESSMENT AND PLAN / ED COURSE  Pertinent labs & imaging results that were available during my care of the patient were reviewed by me and considered in my medical decision making (see chart for details).  Review of the [place] CSRS was performed in accordance of the [place] prior to dispensing any controlled drugs.     Patient's diagnosis is consistent with allergic reaction to insect  sting. Patient was unable to take Benadryl. She has been prescribed an EpiPen in the past but is out and has not used any epinephrine prior to emergency department visit. Patient was given Solu-Medrol as well as famotidine in the emergency department. She tolerated these fine with no complications. Patient will be discharged home with oral prednisone and famotidine. Her EpiPen is refilled. Patient does have a history of CAD and previous MI. Patient is counseled on the adverse effects of epinephrine and encouraged to use EpiPen only in significant, life-threatening allergic reactions. Patient verbalizes understanding of same. Patient will follow-up with primary care.  Patient is given ED precautions to return to the ED for any worsening or new symptoms.     ____________________________________________  FINAL CLINICAL IMPRESSION(S) / ED DIAGNOSES  Final diagnoses:  Bee sting reaction, accidental or unintentional, initial encounter      NEW MEDICATIONS STARTED DURING THIS VISIT:  New Prescriptions   EPINEPHRINE 0.3 MG/0.3 ML IJ SOAJ INJECTION    Inject 0.3 mLs (0.3 mg total) into the muscle once.   FAMOTIDINE (PEPCID) 20 MG TABLET    Take 1 tablet (20 mg total) by mouth daily.   PREDNISONE (DELTASONE) 50 MG TABLET    Take 1 tablet (50 mg total) by  mouth daily with breakfast.        This chart was dictated using voice recognition software/Dragon. Despite best efforts to proofread, errors can occur which can change the meaning. Any change was purely unintentional.    Darletta Moll, PA-C 02/19/17 1632    Schuyler Amor, MD 02/19/17 2153

## 2017-02-19 NOTE — ED Triage Notes (Signed)
Pt reports insect sting to right hand yesterday, moderate amount of swelling noted to right hand.

## 2017-02-19 NOTE — ED Notes (Signed)
Pt to ed with c/o insect bite/sting to right hand fourth digit yesterday.  Pt with swelling to right hand and right fingers.  +movement and +sensation in right hand.

## 2017-02-24 IMAGING — CR DG CHEST 2V
2 series · 2 of 2 positions shown · non-contrast
Comparison: Chest x-ray of 02/10/2015

CLINICAL DATA: Right upper lobe pneumonia, followup, smoking
history

EXAM:
CHEST  2 VIEW

[chest pa]
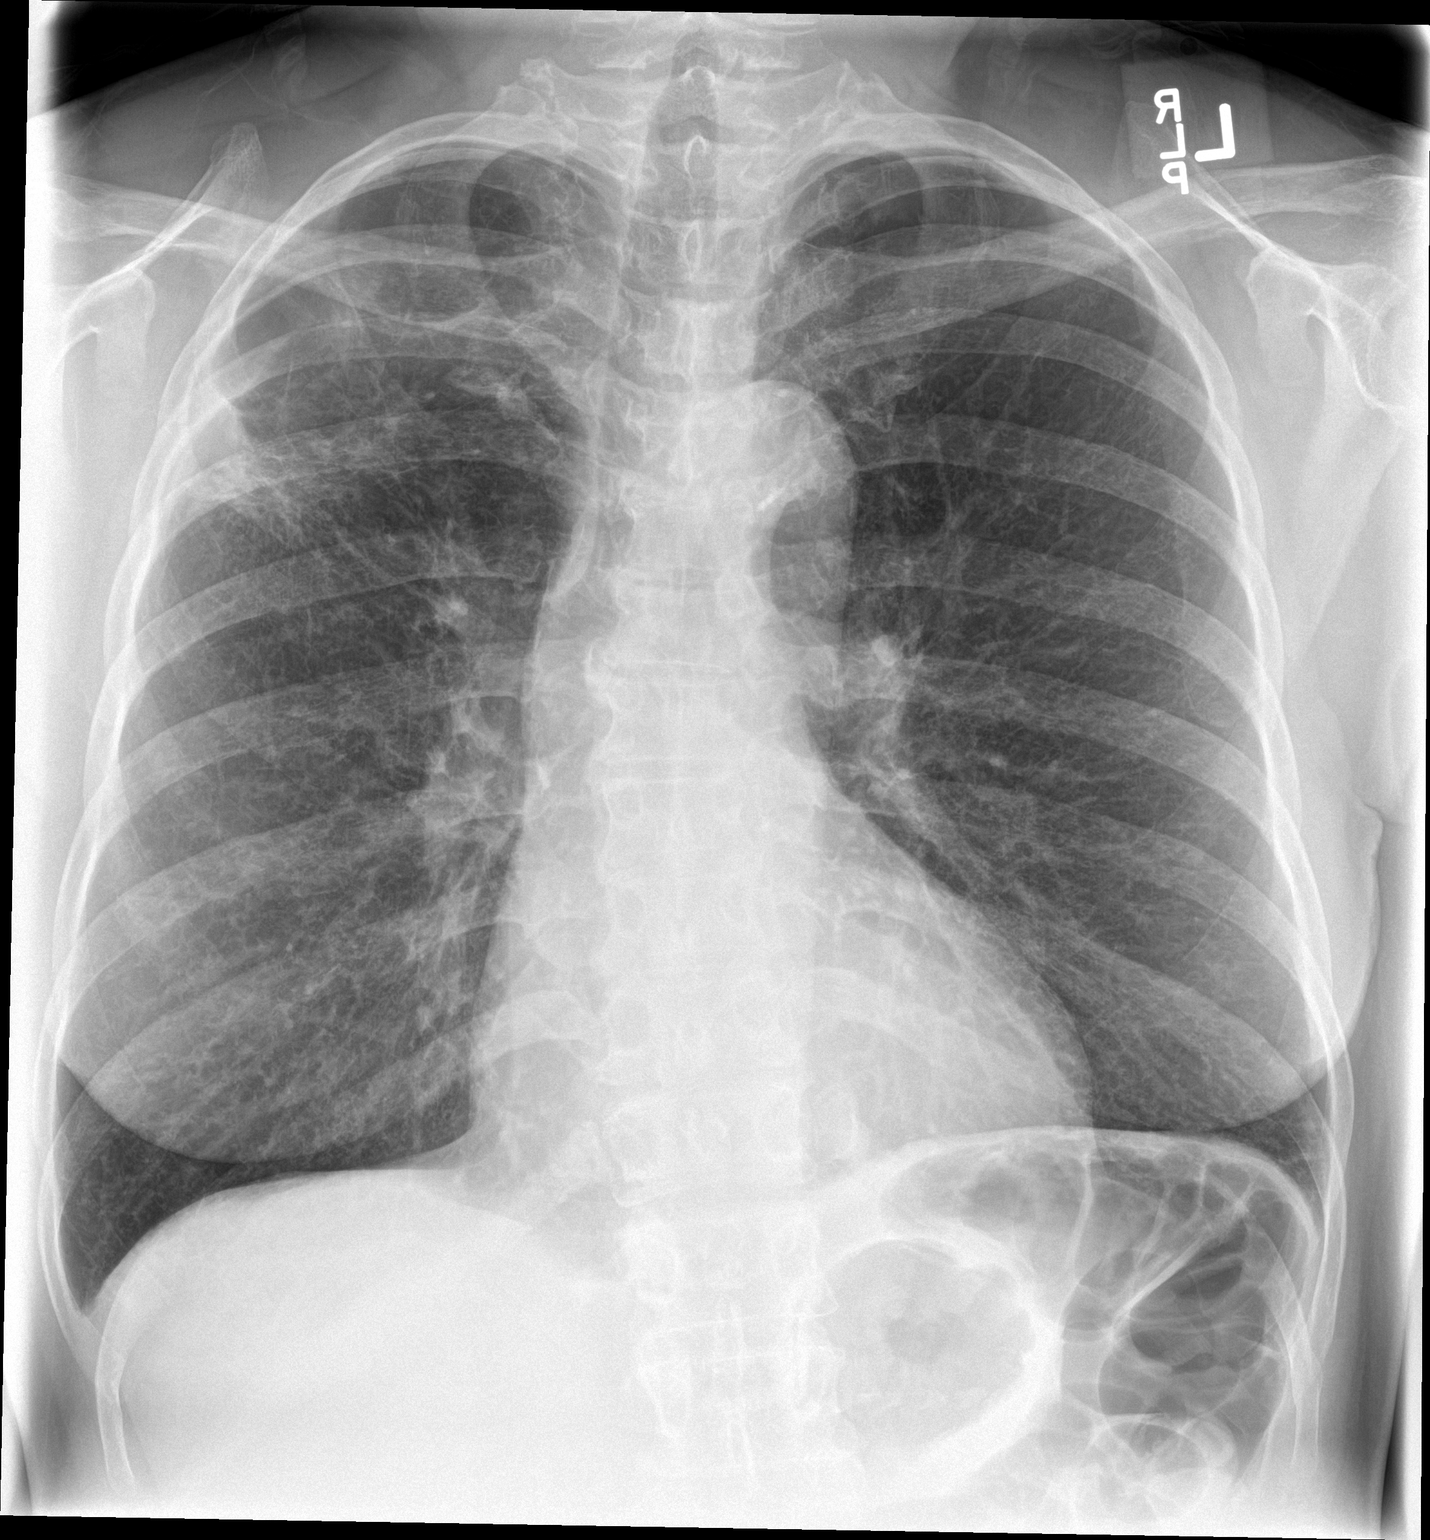

[chest lat]
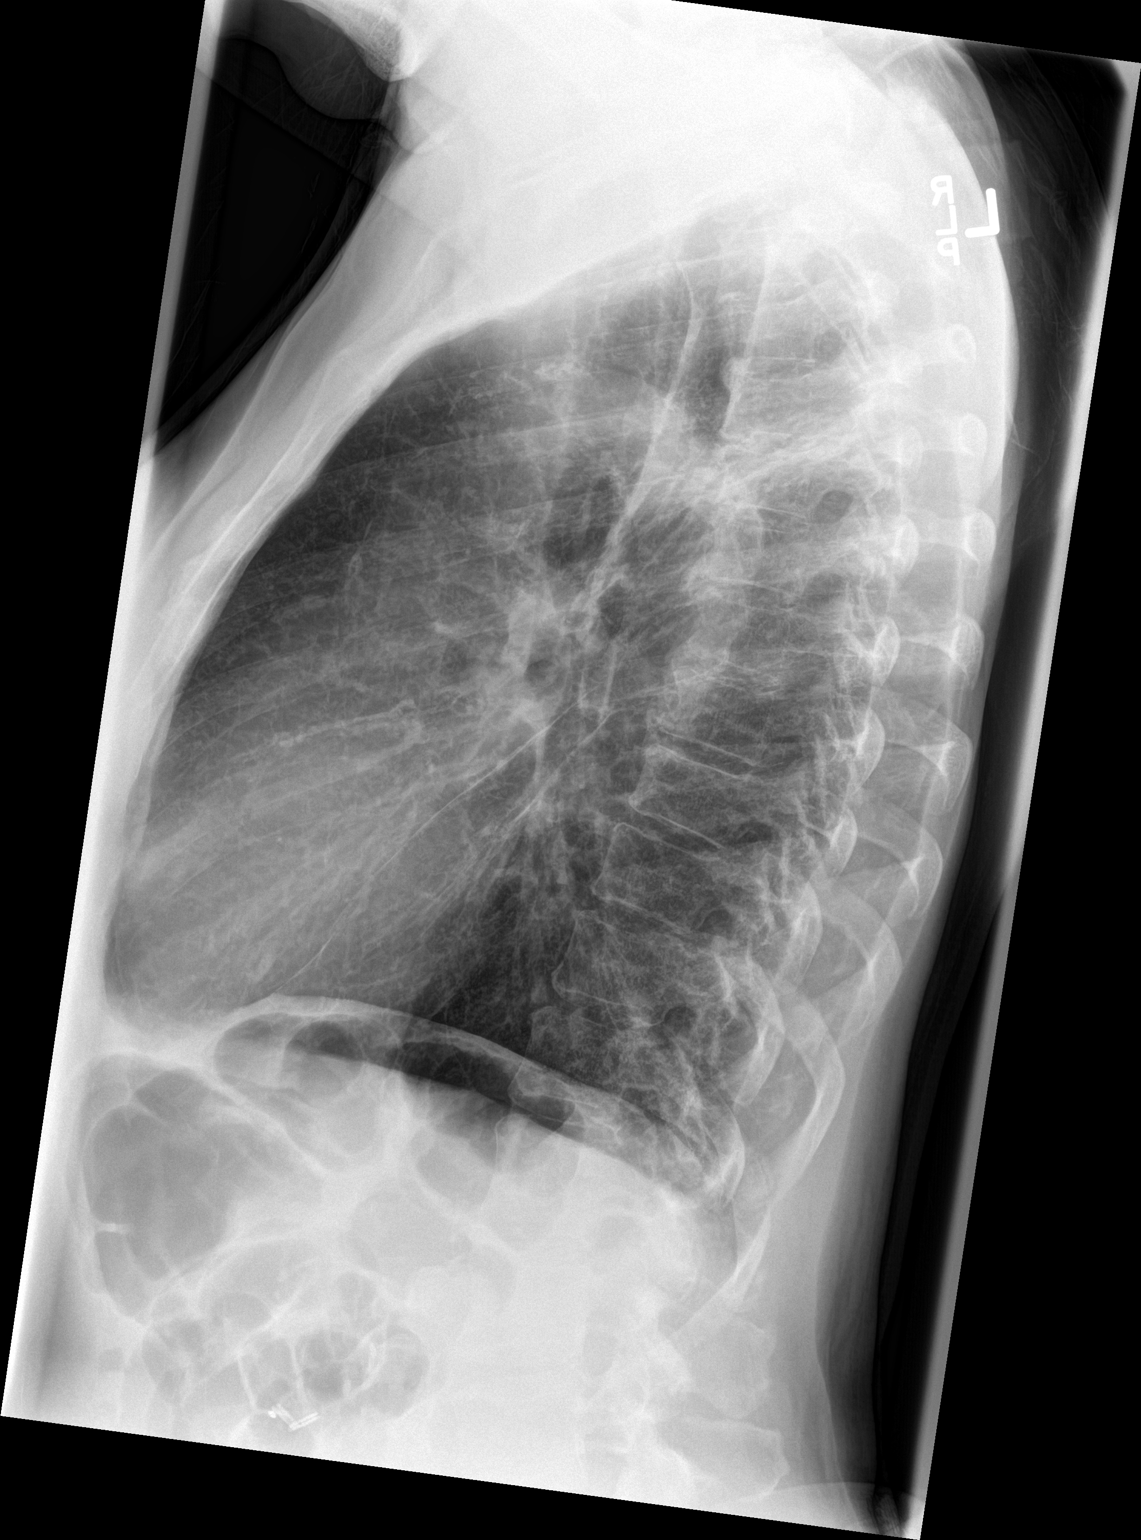

[2 of 2 positions shown; findings below may reference images not displayed]

FINDINGS: There has been some interval improvement in the peripheral opacity
in the posterior right upper lobe. Compared to the prior CT scan,
there has been significant improvement in thisl opacity most likely
represents residual pneumonia and/or scarring. Continued follow-up
however is recommended to exclude an underlying neoplasm. No pleural
effusion is seen. Mediastinal and hilar contours are unremarkable.
The heart is within upper limits of normal. No bony abnormality is
seen.
IMPRESSION: There is persistent opacity peripherally in the right upper lobe
although the surrounding parenchymal opacity has improved. This most
likely represents residual pneumonia or scarring but continued
followup is recommended.

## 2017-02-25 DIAGNOSIS — R0602 Shortness of breath: Secondary | ICD-10-CM | POA: Diagnosis not present

## 2017-02-25 DIAGNOSIS — J449 Chronic obstructive pulmonary disease, unspecified: Secondary | ICD-10-CM | POA: Diagnosis not present

## 2017-02-25 DIAGNOSIS — R911 Solitary pulmonary nodule: Secondary | ICD-10-CM | POA: Diagnosis not present

## 2017-03-20 DIAGNOSIS — Z720 Tobacco use: Secondary | ICD-10-CM | POA: Diagnosis not present

## 2017-03-20 DIAGNOSIS — I10 Essential (primary) hypertension: Secondary | ICD-10-CM | POA: Diagnosis not present

## 2017-04-17 DIAGNOSIS — I10 Essential (primary) hypertension: Secondary | ICD-10-CM | POA: Diagnosis not present

## 2017-04-17 DIAGNOSIS — I2510 Atherosclerotic heart disease of native coronary artery without angina pectoris: Secondary | ICD-10-CM | POA: Diagnosis not present

## 2017-04-17 DIAGNOSIS — Z720 Tobacco use: Secondary | ICD-10-CM | POA: Diagnosis not present

## 2017-04-25 ENCOUNTER — Other Ambulatory Visit: Payer: Self-pay | Admitting: Internal Medicine

## 2017-04-25 DIAGNOSIS — Z1231 Encounter for screening mammogram for malignant neoplasm of breast: Secondary | ICD-10-CM

## 2017-05-13 ENCOUNTER — Other Ambulatory Visit (INDEPENDENT_AMBULATORY_CARE_PROVIDER_SITE_OTHER): Payer: Self-pay | Admitting: Vascular Surgery

## 2017-05-13 DIAGNOSIS — I6523 Occlusion and stenosis of bilateral carotid arteries: Secondary | ICD-10-CM

## 2017-05-14 ENCOUNTER — Ambulatory Visit (INDEPENDENT_AMBULATORY_CARE_PROVIDER_SITE_OTHER): Payer: Medicare Other

## 2017-05-14 ENCOUNTER — Ambulatory Visit (INDEPENDENT_AMBULATORY_CARE_PROVIDER_SITE_OTHER): Payer: Medicare Other | Admitting: Vascular Surgery

## 2017-05-14 ENCOUNTER — Encounter (INDEPENDENT_AMBULATORY_CARE_PROVIDER_SITE_OTHER): Payer: Self-pay | Admitting: Vascular Surgery

## 2017-05-14 VITALS — BP 142/89 | HR 69 | Resp 17 | Ht 64.0 in | Wt 132.0 lb

## 2017-05-14 DIAGNOSIS — I70213 Atherosclerosis of native arteries of extremities with intermittent claudication, bilateral legs: Secondary | ICD-10-CM | POA: Diagnosis not present

## 2017-05-14 DIAGNOSIS — I6523 Occlusion and stenosis of bilateral carotid arteries: Secondary | ICD-10-CM

## 2017-05-14 DIAGNOSIS — I10 Essential (primary) hypertension: Secondary | ICD-10-CM

## 2017-05-14 DIAGNOSIS — F17210 Nicotine dependence, cigarettes, uncomplicated: Secondary | ICD-10-CM

## 2017-05-14 DIAGNOSIS — F17200 Nicotine dependence, unspecified, uncomplicated: Secondary | ICD-10-CM

## 2017-05-14 NOTE — Progress Notes (Signed)
MRN : [id number]   [name] is a 61 y.o. (05/04/1956) female who presents with chief complaint of  Chief Complaint  Patient presents with  . Carotid    36yr follow up  .  History of Present Illness:   The patient is seen for follow up evaluation of chronic carotid artery stenosis. The carotid stenosis is followed by ultrasound. Current U/S collected today (05/14/2017) demonstrates non-hemodynamically significant stenosis of 1-39% to bilateral proximal internal carotid arteries and a a greater than 50% stenosis of the left external carotid artery. There is no change compared to last study obtained in 05/2016.   The patient also had yearly ABIs completed for her chronic PAD. She has hx of multiple interventions to bilateral legs. She does complain of some nocturnal leg cramping occasionally which is relieved by massaging the lower extremity or moving the extremity. She does report some left calf cramping with ambulation but she states that she is able to walk for 30 minutes of more before the pain occurs and it does not have a significant impact on her quality of life. Uneven terrain and inclined can shorten the distance. The pain is stable and has not been progressing or changing. ABIs completed today reveal pressures of 0.96 on the right and 1.02 on the left. She has patent bilateral superficial femoral/popliteal artery stents, partially occlusive plaques in the bilateral superificial femoral and right popliteal arteries, with no significant stenosis in BLE arterial systems. When compared to previous exam on 05/09/2016 these results demonstrate moderate improvement with the previous ABI being rt: 0.89 and left: 0.72 respectively.   The patient denies rest pain, nocturnal pain, dangling of an extremity off the side of the bed/chair to achieve relief. Denies skin discoloration, open wounds, or sores to the lower extremities at this time. History of multiple prior interventions and surgeries to  bilateral lower extremities with the most recent intervention being a left common femoral artery PTA on 01/02/2016.  The patient is taking enteric-coated aspirin 81 mg, Eliquis, and a statin daily.    Tobacco use: current, states she is trying to reduce her use  The patient denies amaurosis fugax. There is no recent history of TIA symptoms or focal motor deficits. There is no prior documented CVA. There is no history of migraine headaches. There is no history of seizures.  The patient endorses some mild PAD/claudication symptoms.  The patient denies any new changes in claudication symptoms or new rest pain symptoms.  The patient denies history of DVT, PE or superficial thrombophlebitis. The patient denies recent episodes of angina or shortness of breath.    Current Meds  Medication Sig  . aspirin EC 81 MG EC tablet Take 1 tablet (81 mg total) by mouth daily.  Marland Kitchen atorvastatin (LIPITOR) 80 MG tablet Take 80 mg by mouth every morning.   . budesonide-formoterol (SYMBICORT) 80-4.5 MCG/ACT inhaler Inhale 2 puffs into the lungs daily.  . cetirizine (ZYRTEC) 10 MG tablet Take 10 mg by mouth daily.  . Cholecalciferol (VITAMIN D3) 2000 units TABS Take 1 tablet by mouth daily.  . ferrous sulfate 325 (65 FE) MG tablet Take 325 mg by mouth daily.  Marland Kitchen losartan (COZAAR) 50 MG tablet Take 50 mg by mouth daily.    Past Medical History:  Diagnosis Date  . Arthritis   . Asthma   . Carotid artery stenosis   . COPD (chronic obstructive pulmonary disease) ([place])   . Coronary artery disease   . Hemorrhoids   .  Hyperlipidemia   . Hypertension   . 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[facility])    negative cardiac cath  . Nicotine addiction   . Peripheral vascular disease ([place])   . Pneumonia   . Shortness of breath dyspnea     Past Surgical History:  Procedure Laterality Date  . ABDOMINAL HYSTERECTOMY    . APPENDECTOMY    . CARDIAC CATHETERIZATION    . CARDIAC SURGERY    . COLONOSCOPY  12/15/11   OH->bleeding  internal hemorrhoids, otherwise normal  . COLONOSCOPY WITH PROPOFOL N/A 04/12/2015   Procedure: COLONOSCOPY WITH PROPOFOL;  Surgeon: Lucilla Lame, MD;  Location: [facility] ENDOSCOPY;  Service: Endoscopy;  Laterality: N/A;  . ENDARTERECTOMY FEMORAL Left 04/26/2015   Procedure: ENDARTERECTOMY FEMORAL;  Surgeon: Algernon Huxley, MD;  Location: [facility] ORS;  Service: Vascular;  Laterality: Left;  . ENDOVASCULAR STENT INSERTION Bilateral    Legs  . FLEXIBLE BRONCHOSCOPY N/A 02/02/2015   Procedure: FLEXIBLE BRONCHOSCOPY;  Surgeon: Allyne Gee, MD;  Location: [facility] ORS;  Service: Pulmonary;  Laterality: N/A;  . HEMORRHOID SURGERY    . PERIPHERAL VASCULAR CATHETERIZATION Left 04/25/2015   Procedure: Lower Extremity Angiography;  Surgeon: Algernon Huxley, MD;  Location: [place] [facility];  Service: Cardiovascular;  Laterality: Left;  . PERIPHERAL VASCULAR CATHETERIZATION Left 04/25/2015   Procedure: Lower Extremity Intervention;  Surgeon: Algernon Huxley, MD;  Location: [place] [facility];  Service: Cardiovascular;  Laterality: Left;  . PERIPHERAL VASCULAR CATHETERIZATION Left 01/02/2016   Procedure: Lower Extremity Angiography;  Surgeon: Algernon Huxley, MD;  Location: [place] [facility];  Service: Cardiovascular;  Laterality: Left;  . PERIPHERAL VASCULAR CATHETERIZATION  01/02/2016   Procedure: Lower Extremity Intervention;  Surgeon: Algernon Huxley, MD;  Location: [place] [facility];  Service: Cardiovascular;;  . THROMBECTOMY FEMORAL ARTERY Left 04/26/2015   Procedure: THROMBECTOMY FEMORAL ARTERY;  Surgeon: Algernon Huxley, MD;  Location: [facility] ORS;  Service: Vascular;  Laterality: Left;    Social History Social History  Substance Use Topics  . Smoking status: Current Every Day Smoker    Packs/day: 1.00    Years: 41.00    Types: Cigarettes  . Smokeless tobacco: Never Used     Comment: pt recommend to stop smoking 4 min spent will start on prn nicotine replacment  . Alcohol use 0.0 oz/week     Comment: weekends, couple beers,  pint of liquer only on weekends    Family History Family History  Problem Relation Age of Onset  . Hypertension Mother   . Hypertension Father   . Breast cancer Neg Hx     Allergies  Allergen Reactions  . Aspirin Nausea And Vomiting and Other (See Comments)    Pt states aspirin makes her cramp and have to use coated kind.  Alethia Berthold [Cetirizine]      REVIEW OF SYSTEMS (Negative unless checked)  Constitutional: [] Weight loss  [] Fever  [] Chills Cardiac: [] Chest pain   [] Chest pressure   [] Palpitations   [] Shortness of breath when laying flat   [] Shortness of breath with exertion. Vascular:  [x] Pain in legs with walking   [] Pain in legs at rest  [] History of DVT   [] Phlebitis   [] Swelling in legs   [] Varicose veins   [] Non-healing ulcers Pulmonary:   [] Uses home oxygen   [] Productive cough   [] Hemoptysis   [] Wheeze  [] COPD   [] Asthma Neurologic:  [] Dizziness   [] Seizures   [] History of stroke   [] History of TIA  [] Aphasia  []  Facial  droop  [] Visual changes   [] Weakness or numbness in arm   [] Weakness or numbness in leg Musculoskeletal:   [] Joint swelling   [] Joint pain   [] Low back pain  Hematologic:  [] Easy bruising  [] Easy bleeding   [] Hypercoagulable state   [x] Anemic Gastrointestinal:  [] Diarrhea   [] Vomiting  []  Abdominal pain  [] Difficulty swallowing.  []  Blood in stool Genitourinary:  [] Chronic kidney disease   [] Difficult urination  [] Frequent urination   [] Blood in urine Skin:  [] Rashes   [] Ulcers  Psychological:  [] History of anxiety   []  History of major depression.  Physical Examination  Vitals:   05/14/17 1109 05/14/17 1110  BP: (!) 151/85 (!) 142/89  Pulse: 69   Resp: 17   Weight: 132 lb (59.9 kg)   Height: 5\' 4"  (1.626 m)    Body mass index is 22.66 kg/m. Gen: WD/WN, NAD Head: [place]/AT, No temporalis wasting.  Ear/Nose/Throat: Hearing grossly intact, nares w/o erythema or drainage Eyes: PER, EOMI, sclera nonicteric.  Pulmonary:  Good air movement, no use of  accessory muscles.  Cardiac: RRR, no JVD, no carotid bruit ausculted Vascular:  Vessel Right Left  Radial +2 Palpable +2 Palpable  Ulnar    Brachial    Carotid    Femoral    Popliteal    PT +1 Palpable +1 Palpable  DP +2 Palpable +1 Palpable  No lower extremity swelling  Musculoskeletal: M/S 5/5 throughout.  No deformity or atrophy.  Neurologic: CN 2-12 intact. Facial movements symmetrical.  Speech is fluent. Motor exam as listed above. Sensation intact to bilateral upper and lower extremities. No arm drift.  Psychiatric: Judgment intact, Mood & affect appropriate for pt's clinical situation. Dermatologic: No rashes, ulcers, or non-healing wounds noted.  No changes consistent with cellulitis or stasis dermatitis.  Lymph : No lichenification or skin changes of chronic lymphedema.  CBC Lab Results  Component Value Date   WBC 4.6 10/12/2015   HGB 13.7 10/12/2015   HCT 40.3 10/12/2015   MCV 91.9 10/12/2015   PLT 167 10/12/2015    BMET    Component Value Date/Time   NA 138 10/12/2015 0948   NA 131 (L) 10/15/2013 1123   K 4.5 10/12/2015 0948   K 3.6 10/15/2013 1123   CL 107 10/12/2015 0948   CL 102 10/15/2013 1123   CO2 24 10/12/2015 0948   CO2 24 10/15/2013 1123   GLUCOSE 94 10/12/2015 0948   GLUCOSE 111 (H) 10/15/2013 1123   BUN 12 12/30/2015 1311   BUN 12 10/15/2013 1123   CREATININE 0.90 04/13/2016 1104   CREATININE 1.20 10/15/2013 1123   CALCIUM 9.9 10/12/2015 0948   CALCIUM 9.1 10/15/2013 1123   GFRNONAA >60 12/30/2015 1311   GFRNONAA 50 (L) 10/15/2013 1123   GFRAA >60 12/30/2015 1311   GFRAA 58 (L) 10/15/2013 1123   CrCl cannot be calculated (Patient's most recent lab result is older than the maximum 21 days allowed.).  COAG Lab Results  Component Value Date   INR 0.94 04/25/2015    Radiology  See ABI's and carotid U/S under media tab on 05/14/2017. These studies have been reviewed by myself and Dr. Lucky Cowboy.   Assessment/Plan   1. Carotid stenosis    Given the patient's asymptomatic subcritical stenosis and lack of neurologic symptoms no further invasive testing or surgery at this time.  Duplex ultrasound shows 1-39% non-hemodynamically significant stenosis to bilateral proximal internal carotid arteries.  Continue antiplatelet therapy as prescribed Continue optimal management of comorbid conditions Encouraged  patient to adhere to a healthy heart diet and encouraged moderate intensity exercise for at least 30 minutes at least 4 times per week Encouraged Tobacco cessation Follow up in 24 months with duplex ultrasound and physical exam based on < 50 % stenosis of bilateral carotid arteries   2. Atherosclerosis of native arteries of extremity with intermittent claudication (HCC)   Recommend:  The patient has evidence of atherosclerosis of the lower extremities with some claudication and has had multiple interventions. ABI obtained today revealed pressures of 0.96 on the right and 1.02 on the left. She has patent bilateral superficial femoral/popliteal artery stents, partially occlusive plaque in the bilateral superificial femoral and popliteal arteries, with no significant stenosis in BLE arterial systems. This is improved from her previous ABI in 05/2016. The patient does not voice lifestyle limiting changes at this point in time and there are no open wounds/non-healing ulcer present at this time.  No invasive studies, angiography or surgery at this time The patient should continue walking and begin a more formal exercise program.  The patient should continue to have antiplatelet therapy and aggressive management of comorbid conditions Discussed tobacco cessation options. See below.   No changes in the patient's medications at this time  The patient should continue wearing graduated compression socks 10-15 mmHg strength to control the mild edema.     3. Hypertension  Continue antihypertensive medications as currently ordered, these  medications have been reviewed and there are no changes at this time.  4. Tobacco use disorder  We had a discussion for approximately 5 minutes regarding the absolute need for smoking cessation due to the deleterious nature of tobacco on the vascular system. We discussed the tobacco use would diminish patency of any intervention, and likely significantly worsen progressio of disease. We discussed multiple agents for quitting including replacement therapy or medications to reduce cravings such as Chantix. The patient voices their understanding of the importance of smoking cessation. Patient states she is currently cutting down from 2-3 PPD to one. Denies desire for rx medication to assist with cessation at this time.   -Red flags and when to present for emergency care or RTC including chest pain, shortness of breath, new/worsening/un-resolving symptoms, CVA/TIA symptoms, and ischemic leg symptoms reviewed with patient at time of visit. Follow up and care instructions discussed and patient verbalized understanding.  I have discussed this patient with Dr. Lucky Cowboy who agrees with this plan of care.   Raynelle Fanning, NP  05/14/2017 11:55 AM

## 2017-05-14 NOTE — Patient Instructions (Signed)
Carotid Artery Disease The carotid arteries are the two main arteries on either side of the neck that supply blood to the brain. Carotid artery disease, also called carotid artery stenosis, is the narrowing or blockage of one or both carotid arteries. Carotid artery disease increases your risk for a stroke or a transient ischemic attack (TIA). A TIA is an episode in which a waxy, fatty substance that accumulates within the artery (plaque) blocks blood flow to the brain. A TIA is considered a "warning stroke." What are the causes?  Buildup of plaque inside the carotid arteries (atherosclerosis) (common).  A weakened outpouching in an artery (aneurysm).  Inflammation of the carotid artery (arteritis).  A fibrous growth within the carotid artery (fibromuscular dysplasia).  Tissue death within the carotid artery due to radiation treatment (post-radiation necrosis).  Decreased blood flow due to spasms of the carotid artery (vasospasm).  Separation of the walls of the carotid artery (carotid dissection). What increases the risk?  High cholesterol (dyslipidemia).  High blood pressure (hypertension).  Smoking.  Obesity.  Diabetes.  Family history of cardiovascular disease.  Inactivity or lack of regular exercise.  Being female. Men have an increased risk of developing atherosclerosis earlier in life than women. What are the signs or symptoms? Carotid artery disease does not cause symptoms. How is this diagnosed? Diagnosis of carotid artery disease may include:  A physical exam. Your health care provider may hear an abnormal sound (bruit) when listening to the carotid arteries.  Specific tests that look at the blood flow in the carotid arteries. These tests include: ? Carotid artery ultrasonography. ? Carotid or cerebral angiography. ? Computerized tomographic angiography (CTA). ? Magnetic resonance angiography (MRA).  How is this treated? Treatment of carotid artery disease can  include a combination of treatments. Treatment options include:  Surgery. You may have: ? A carotid endarterectomy. This is a surgery to remove the blockages in the carotid arteries. ? A carotid angioplasty with stenting. This is a nonsurgical interventional procedure. A wire mesh (stent) is used to widen the blocked carotid arteries.  Medicines to control blood pressure, cholesterol, and reduce blood clotting (antiplatelet therapy).  Adjusting your diet.  Lifestyle changes such as: ? Quitting smoking. ? Exercising as tolerated or as directed by your health care provider. ? Controlling and maintaining a good blood pressure. ? Keeping cholesterol levels under control.  Follow these instructions at home:  Take medicines only as directed by your health care provider. Make sure you understand all your medicine instructions. Do not stop your medicines without talking to your health care provider.  Follow your health care provider's diet instructions. It is important to eat a healthy diet that is low in saturated fats and includes plenty of fresh fruits, vegetables, and lean meats. High-fat, high-sodium foods as well as foods that are fried, overly processed, or have poor nutritional value should be avoided.  Maintain a healthy weight.  Stay physically active. It is recommended that you get at least 30 minutes of activity every day.  Do not use any tobacco products including cigarettes, chewing tobacco, or electronic cigarettes. If you need help quitting, ask your health care provider.  Limit alcohol use to: ? No more than 2 drinks per day for men. ? No more than 1 drink per day for nonpregnant women.  Do not use illegal drugs.  Keep all follow-up visits as directed by your health care provider. Get help right away if: You develop TIA or stroke symptoms. These include:  Sudden   weakness or numbness on one side of the body, such as in the face, arm, or leg.  Sudden  confusion.  Trouble speaking (aphasia) or understanding.  Sudden trouble seeing out of one or both eyes.  Sudden trouble walking.  Dizziness or feeling like you might faint.  Loss of balance or coordination.  Sudden severe headache with no known cause.  Sudden trouble swallowing (dysphagia).  If you have any of these symptoms, call your local emergency services (911 in [place] Do not drive yourself to the clinic or hospital. This is a medical emergency. This information is not intended to replace advice given to you by your health care provider. Make sure you discuss any questions you have with your health care provider. Document Released: 11/12/2011 Document Revised: 01/26/2016 Document Reviewed: 02/18/2013 Elsevier Interactive Patient Education  2017 [place]. Peripheral Vascular Disease Peripheral vascular disease (PVD) is a disease of the blood vessels that are not part of your heart and brain. A simple term for PVD is poor circulation. In most cases, PVD narrows the blood vessels that carry blood from your heart to the rest of your body. This can result in a decreased supply of blood to your arms, legs, and internal organs, like your stomach or kidneys. However, it most often affects a person's lower legs and feet. There are two types of PVD.  Organic PVD. This is the more common type. It is caused by damage to the structure of blood vessels.  Functional PVD. This is caused by conditions that make blood vessels contract and tighten (spasm).  Without treatment, PVD tends to get worse over time. PVD can also lead to acute ischemic limb. This is when an arm or limb suddenly has trouble getting enough blood. This is a medical emergency. Follow these instructions at home:  Take medicines only as told by your doctor.  Do not use any tobacco products, including cigarettes, chewing tobacco, or electronic cigarettes. If you need help quitting, ask your doctor.  Lose weight if you  are overweight, and maintain a healthy weight as told by your doctor.  Eat a diet that is low in fat and cholesterol. If you need help, ask your doctor.  Exercise regularly. Ask your doctor for some good activities for you.  Take good care of your feet. ? Wear comfortable shoes that fit well. ? Check your feet often for any cuts or sores. Contact a doctor if:  You have cramps in your legs while walking.  You have leg pain when you are at rest.  You have coldness in a leg or foot.  Your skin changes.  You are unable to get or have an erection (erectile dysfunction).  You have cuts or sores on your feet that are not healing. Get help right away if:  Your arm or leg turns cold and blue.  Your arms or legs become red, warm, swollen, painful, or numb.  You have chest pain or trouble breathing.  You suddenly have weakness in your face, arm, or leg.  You become very confused or you cannot speak.  You suddenly have a very bad headache.  You suddenly cannot see. This information is not intended to replace advice given to you by your health care provider. Make sure you discuss any questions you have with your health care provider. Document Released: 11/14/2009 Document Revised: 01/26/2016 Document Reviewed: 01/28/2014 Elsevier Interactive Patient Education  2017 [employer].

## 2017-05-21 ENCOUNTER — Other Ambulatory Visit
Admission: RE | Admit: 2017-05-21 | Discharge: 2017-05-21 | Disposition: A | Discharge status: Home / Self Care | Payer: Medicare Other | Source: Ambulatory Visit | Attending: Nurse Practitioner | Admitting: Nurse Practitioner

## 2017-05-21 DIAGNOSIS — E559 Vitamin D deficiency, unspecified: Secondary | ICD-10-CM | POA: Diagnosis not present

## 2017-05-21 DIAGNOSIS — Z0001 Encounter for general adult medical examination with abnormal findings: Secondary | ICD-10-CM | POA: Insufficient documentation

## 2017-05-21 DIAGNOSIS — E782 Mixed hyperlipidemia: Secondary | ICD-10-CM | POA: Diagnosis not present

## 2017-05-21 DIAGNOSIS — I10 Essential (primary) hypertension: Secondary | ICD-10-CM | POA: Insufficient documentation

## 2017-05-21 LAB — LIPID PANEL
CHOL/HDL RATIO: 3.4 ratio
Cholesterol: 165 mg/dL (ref 0–200)
HDL: 48 mg/dL (ref >40)
LDL CALC: 94 mg/dL (ref 0–99)
Triglycerides: 114 mg/dL (ref <150)
VLDL: 23 mg/dL (ref 0–40)

## 2017-05-21 LAB — COMPREHENSIVE METABOLIC PANEL
ALBUMIN: 4.1 g/dL (ref 3.5–5.0)
ALT: 17 U/L (ref 14–54)
ANION GAP: 9 (ref 5–15)
AST: 25 U/L (ref 15–41)
Alkaline Phosphatase: 56 U/L (ref 38–126)
BUN: 22 mg/dL — AB (ref 6–20)
CHLORIDE: 105 mmol/L (ref 101–111)
CO2: 24 mmol/L (ref 22–32)
Calcium: 9.8 mg/dL (ref 8.9–10.3)
Creatinine, Ser: 1.16 mg/dL — ABNORMAL HIGH (ref 0.44–1.00)
GFR calc Af Amer: 58 mL/min — ABNORMAL LOW (ref >60)
GFR calc non Af Amer: 50 mL/min — ABNORMAL LOW (ref >60)
GLUCOSE: 86 mg/dL (ref 65–99)
POTASSIUM: 4 mmol/L (ref 3.5–5.1)
SODIUM: 138 mmol/L (ref 135–145)
TOTAL PROTEIN: 7.6 g/dL (ref 6.5–8.1)
Total Bilirubin: 0.4 mg/dL (ref 0.3–1.2)

## 2017-05-21 LAB — CBC
HCT: 37.9 % (ref 35.0–47.0)
Hemoglobin: 13.2 g/dL (ref 12.0–16.0)
MCH: 33.1 pg (ref 26.0–34.0)
MCHC: 34.8 g/dL (ref 32.0–36.0)
MCV: 95 fL (ref 80.0–100.0)
PLATELETS: 152 10*3/uL (ref 150–440)
RBC: 3.99 MIL/uL (ref 3.80–5.20)
RDW: 14.3 % (ref 11.5–14.5)
WBC: 4.6 10*3/uL (ref 3.6–11.0)

## 2017-05-21 LAB — TSH: TSH: 1.017 u[IU]/mL (ref 0.350–4.500)

## 2017-05-22 LAB — T4: T4 TOTAL: 6.7 ug/dL (ref 4.5–12.0)

## 2017-05-22 LAB — VITAMIN D 25 HYDROXY (VIT D DEFICIENCY, FRACTURES): Vit D, 25-Hydroxy: 43.3 ng/mL (ref 30.0–100.0)

## 2017-06-03 DIAGNOSIS — Z0001 Encounter for general adult medical examination with abnormal findings: Secondary | ICD-10-CM | POA: Diagnosis not present

## 2017-06-03 DIAGNOSIS — I10 Essential (primary) hypertension: Secondary | ICD-10-CM | POA: Diagnosis not present

## 2017-06-03 DIAGNOSIS — J449 Chronic obstructive pulmonary disease, unspecified: Secondary | ICD-10-CM | POA: Diagnosis not present

## 2017-06-03 DIAGNOSIS — I739 Peripheral vascular disease, unspecified: Secondary | ICD-10-CM | POA: Diagnosis not present

## 2017-06-03 DIAGNOSIS — J309 Allergic rhinitis, unspecified: Secondary | ICD-10-CM | POA: Diagnosis not present

## 2017-06-03 DIAGNOSIS — E782 Mixed hyperlipidemia: Secondary | ICD-10-CM | POA: Diagnosis not present

## 2017-06-04 ENCOUNTER — Ambulatory Visit
Admission: RE | Admit: 2017-06-04 | Discharge: 2017-06-04 | Disposition: A | Discharge status: Home / Self Care | Payer: Medicare Other | Source: Ambulatory Visit | Attending: Internal Medicine | Admitting: Internal Medicine

## 2017-06-04 DIAGNOSIS — Z1231 Encounter for screening mammogram for malignant neoplasm of breast: Secondary | ICD-10-CM | POA: Diagnosis not present

## 2017-06-25 DIAGNOSIS — J439 Emphysema, unspecified: Secondary | ICD-10-CM | POA: Diagnosis not present

## 2017-06-25 DIAGNOSIS — M792 Neuralgia and neuritis, unspecified: Secondary | ICD-10-CM | POA: Diagnosis not present

## 2017-06-25 DIAGNOSIS — R0602 Shortness of breath: Secondary | ICD-10-CM | POA: Diagnosis not present

## 2017-06-25 DIAGNOSIS — J449 Chronic obstructive pulmonary disease, unspecified: Secondary | ICD-10-CM | POA: Diagnosis not present

## 2017-06-25 DIAGNOSIS — R911 Solitary pulmonary nodule: Secondary | ICD-10-CM | POA: Diagnosis not present

## 2017-07-17 DIAGNOSIS — Z720 Tobacco use: Secondary | ICD-10-CM | POA: Diagnosis not present

## 2017-07-17 DIAGNOSIS — I10 Essential (primary) hypertension: Secondary | ICD-10-CM | POA: Diagnosis not present

## 2017-08-07 DIAGNOSIS — I10 Essential (primary) hypertension: Secondary | ICD-10-CM | POA: Diagnosis not present

## 2017-08-07 DIAGNOSIS — Z720 Tobacco use: Secondary | ICD-10-CM | POA: Diagnosis not present

## 2017-08-07 DIAGNOSIS — E785 Hyperlipidemia, unspecified: Secondary | ICD-10-CM | POA: Diagnosis not present

## 2017-08-07 DIAGNOSIS — I2510 Atherosclerotic heart disease of native coronary artery without angina pectoris: Secondary | ICD-10-CM | POA: Diagnosis not present

## 2017-09-08 ENCOUNTER — Other Ambulatory Visit: Payer: Self-pay

## 2017-09-08 ENCOUNTER — Emergency Department
Admission: EM | Admit: 2017-09-08 | Discharge: 2017-09-08 | Disposition: A | Discharge status: Home / Self Care | Payer: Medicare Other | Attending: Emergency Medicine | Admitting: Emergency Medicine

## 2017-09-08 ENCOUNTER — Emergency Department: Payer: Medicare Other

## 2017-09-08 ENCOUNTER — Encounter: Payer: Self-pay | Admitting: Emergency Medicine

## 2017-09-08 DIAGNOSIS — J449 Chronic obstructive pulmonary disease, unspecified: Secondary | ICD-10-CM | POA: Diagnosis not present

## 2017-09-08 DIAGNOSIS — I10 Essential (primary) hypertension: Secondary | ICD-10-CM | POA: Diagnosis not present

## 2017-09-08 DIAGNOSIS — F10120 Alcohol abuse with intoxication, uncomplicated: Secondary | ICD-10-CM | POA: Insufficient documentation

## 2017-09-08 DIAGNOSIS — F1010 Alcohol abuse, uncomplicated: Secondary | ICD-10-CM

## 2017-09-08 DIAGNOSIS — R05 Cough: Secondary | ICD-10-CM | POA: Diagnosis not present

## 2017-09-08 DIAGNOSIS — Z79899 Other long term (current) drug therapy: Secondary | ICD-10-CM | POA: Insufficient documentation

## 2017-09-08 DIAGNOSIS — I252 Old myocardial infarction: Secondary | ICD-10-CM | POA: Insufficient documentation

## 2017-09-08 DIAGNOSIS — F10920 Alcohol use, unspecified with intoxication, uncomplicated: Secondary | ICD-10-CM

## 2017-09-08 DIAGNOSIS — J45909 Unspecified asthma, uncomplicated: Secondary | ICD-10-CM | POA: Diagnosis not present

## 2017-09-08 DIAGNOSIS — Y908 Blood alcohol level of 240 mg/100 ml or more: Secondary | ICD-10-CM | POA: Insufficient documentation

## 2017-09-08 DIAGNOSIS — Z7982 Long term (current) use of aspirin: Secondary | ICD-10-CM | POA: Insufficient documentation

## 2017-09-08 DIAGNOSIS — I2510 Atherosclerotic heart disease of native coronary artery without angina pectoris: Secondary | ICD-10-CM | POA: Diagnosis not present

## 2017-09-08 DIAGNOSIS — F17210 Nicotine dependence, cigarettes, uncomplicated: Secondary | ICD-10-CM | POA: Insufficient documentation

## 2017-09-08 LAB — CBC WITH DIFFERENTIAL/PLATELET
BASOS ABS: 0 10*3/uL (ref 0–0.1)
BASOS PCT: 1 %
EOS ABS: 0 10*3/uL (ref 0–0.7)
EOS PCT: 1 %
HCT: 35.8 % (ref 35.0–47.0)
Hemoglobin: 12.1 g/dL (ref 12.0–16.0)
LYMPHS ABS: 2.4 10*3/uL (ref 1.0–3.6)
Lymphocytes Relative: 40 %
MCH: 32.9 pg (ref 26.0–34.0)
MCHC: 33.8 g/dL (ref 32.0–36.0)
MCV: 97.5 fL (ref 80.0–100.0)
Monocytes Absolute: 0.5 10*3/uL (ref 0.2–0.9)
Monocytes Relative: 8 %
Neutro Abs: 3 10*3/uL (ref 1.4–6.5)
Neutrophils Relative %: 50 %
PLATELETS: 221 10*3/uL (ref 150–440)
RBC: 3.67 MIL/uL — AB (ref 3.80–5.20)
RDW: 15.3 % — ABNORMAL HIGH (ref 11.5–14.5)
WBC: 6 10*3/uL (ref 3.6–11.0)

## 2017-09-08 LAB — COMPREHENSIVE METABOLIC PANEL
ALT: 17 U/L (ref 14–54)
AST: 24 U/L (ref 15–41)
Albumin: 4 g/dL (ref 3.5–5.0)
Alkaline Phosphatase: 60 U/L (ref 38–126)
Anion gap: 14 (ref 5–15)
BUN: 18 mg/dL (ref 6–20)
CHLORIDE: 103 mmol/L (ref 101–111)
CO2: 19 mmol/L — AB (ref 22–32)
CREATININE: 1.81 mg/dL — AB (ref 0.44–1.00)
Calcium: 9.1 mg/dL (ref 8.9–10.3)
GFR calc non Af Amer: 29 mL/min — ABNORMAL LOW (ref >60)
GFR, EST AFRICAN AMERICAN: 34 mL/min — AB (ref >60)
Glucose, Bld: 98 mg/dL (ref 65–99)
Potassium: 3.4 mmol/L — ABNORMAL LOW (ref 3.5–5.1)
Sodium: 136 mmol/L (ref 135–145)
Total Bilirubin: <0.1 mg/dL — ABNORMAL LOW (ref 0.3–1.2)
Total Protein: 7.4 g/dL (ref 6.5–8.1)

## 2017-09-08 LAB — TROPONIN I

## 2017-09-08 LAB — LIPASE, BLOOD: LIPASE: 51 U/L (ref 11–51)

## 2017-09-08 LAB — ETHANOL: Alcohol, Ethyl (B): 254 mg/dL — ABNORMAL HIGH (ref <10)

## 2017-09-08 MED ORDER — SODIUM CHLORIDE 0.9 % IV BOLUS (SEPSIS)
1000.0000 mL | Freq: Once | INTRAVENOUS | Status: AC
  Administered 2017-09-08: 1000 mL via INTRAVENOUS

## 2017-09-08 MED ORDER — HYDROCOD POLST-CPM POLST ER 10-8 MG/5ML PO SUER
5.0000 mL | Freq: Once | ORAL | Status: AC
  Administered 2017-09-08: 5 mL via ORAL
  Filled 2017-09-08: qty 5

## 2017-09-08 MED ORDER — PREDNISONE 20 MG PO TABS
60.0000 mg | ORAL_TABLET | Freq: Once | ORAL | Status: AC
  Administered 2017-09-08: 60 mg via ORAL
  Filled 2017-09-08: qty 3

## 2017-09-08 MED ORDER — PREDNISONE 20 MG PO TABS: ORAL_TABLET | ORAL | 0 refills | Status: DC

## 2017-09-08 MED ORDER — ONDANSETRON HCL 4 MG/2ML IJ SOLN
4.0000 mg | Freq: Once | INTRAMUSCULAR | Status: AC
  Administered 2017-09-08: 4 mg via INTRAVENOUS
  Filled 2017-09-08: qty 2

## 2017-09-08 MED ORDER — IPRATROPIUM-ALBUTEROL 0.5-2.5 (3) MG/3ML IN SOLN
3.0000 mL | Freq: Once | RESPIRATORY_TRACT | Status: AC
  Administered 2017-09-08: 3 mL via RESPIRATORY_TRACT
  Filled 2017-09-08: qty 3

## 2017-09-08 MED ORDER — HYDROCOD POLST-CPM POLST ER 10-8 MG/5ML PO SUER: 5.0000 mL | Freq: Two times a day (BID) | ORAL | 0 refills | Status: DC

## 2017-09-08 NOTE — ED Notes (Signed)
Patient's daughter called at [phone] .

## 2017-09-08 NOTE — ED Notes (Signed)
Daughter at bedside. Per daughter, pt has been coughing x2 weeks and it has been getting worse over the last several days. Pt also "drank a lot" today. Daughter unsure how much.

## 2017-09-08 NOTE — Discharge Instructions (Signed)
1.  Take and finish steroid as prescribed (Prednisone 60 mg daily x  4 days). 2.  You may use cough medicine as needed (Tussionex). 3.  Return to the ER for worsening symptoms, persistent vomiting, difficulty breathing or other concerns.

## 2017-09-08 NOTE — ED Notes (Signed)
Patient's O2 saturation dropped to 87%, woke patient up, O2 saturation increased slightly to 88%, Increased O2 via [place] to 4 lpm, O2 saturation rose to 94%. Notified ED provider. ED provider stated that this patient drops into a "deep alcohol" sleep and O2 saturation subject to dropping.

## 2017-09-08 NOTE — ED Notes (Signed)
Pt placed on 2L [place] d/t O2 sat decreasing to 84% while pt asleep. Will continue to monitor.

## 2017-09-08 NOTE — ED Triage Notes (Signed)
Pt arrives via [facility] from home. Family called EMS when patient began coughing and could not stop. EMS stated family said she "has had every [alcohol] except wine today." Pt unable to tell this RN when she started drinking today but states "I have drank my whole life." Pt has hx of COPD. Coughing up clear thick mucous at this time.

## 2017-09-08 NOTE — ED Provider Notes (Signed)
[facility] Emergency Department Provider Note   ____________________________________________   First MD Initiated Contact with Patient 09/08/17 0032     (approximate)  I have reviewed the triage vital signs and the nursing notes.   HISTORY  Chief Complaint Alcohol Intoxication    HPI [name] is a 62 y.o. female brought to the ED from home via EMS with a chief complaint of intoxication and cough.  Patient has a history of COPD; she was celebrating her nephew's birthday this evening.  Family told EMS patient "had every alcohol except wine today".  Patient had a coughing episode and could not stop coughing so family called EMS.  Patient denies recent fever, chills, chest pain, shortness of breath, abdominal pain, vomiting.  Currently he has some nausea.  Denies recent travel or trauma.   Past Medical History:  Diagnosis Date  . Arthritis   . Asthma   . Carotid artery stenosis   . COPD (chronic obstructive pulmonary disease) ([place])   . Coronary artery disease   . Hemorrhoids   . Hyperlipidemia   . Hypertension   . 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[facility])    negative cardiac cath  . Nicotine addiction   . Peripheral vascular disease ([place])   . Pneumonia   . Shortness of breath dyspnea     Patient Active Problem List   Diagnosis Date Noted  . Tobacco use disorder 11/09/2016  . Atherosclerosis of native arteries of extremity with intermittent claudication ([place]) 11/09/2016  . Ischemic leg 04/25/2015  . Blood in stool   . Benign neoplasm of rectosigmoid junction   . History of colonic polyps 04/11/2015  . H/O acute myocardial infarction 04/11/2015  . H/O hypercholesterolemia 04/11/2015  . H/O disease 04/11/2015  . Hemorrhoids, internal 04/11/2015  . Hypertension 04/11/2015  . Heart disease 04/11/2015  . Hematochezia 02/09/2015  . Acute post-hemorrhagic anemia 02/09/2015  . Blood in feces 02/09/2015  . Acute blood loss anemia 02/09/2015  . Necrotizing  pneumonia ([place]) 01/22/2015  . Abscess of lung ([place]) 01/22/2015    Past Surgical History:  Procedure Laterality Date  . ABDOMINAL HYSTERECTOMY    . APPENDECTOMY    . CARDIAC CATHETERIZATION    . CARDIAC SURGERY    . COLONOSCOPY  12/15/11   OH->bleeding internal hemorrhoids, otherwise normal  . COLONOSCOPY WITH PROPOFOL N/A 04/12/2015   Procedure: COLONOSCOPY WITH PROPOFOL;  Surgeon: Lucilla Lame, MD;  Location: [facility] ENDOSCOPY;  Service: Endoscopy;  Laterality: N/A;  . ENDARTERECTOMY FEMORAL Left 04/26/2015   Procedure: ENDARTERECTOMY FEMORAL;  Surgeon: Algernon Huxley, MD;  Location: [facility] ORS;  Service: Vascular;  Laterality: Left;  . ENDOVASCULAR STENT INSERTION Bilateral    Legs  . FLEXIBLE BRONCHOSCOPY N/A 02/02/2015   Procedure: FLEXIBLE BRONCHOSCOPY;  Surgeon: Allyne Gee, MD;  Location: [facility] ORS;  Service: Pulmonary;  Laterality: N/A;  . HEMORRHOID SURGERY    . PERIPHERAL VASCULAR CATHETERIZATION Left 04/25/2015   Procedure: Lower Extremity Angiography;  Surgeon: Algernon Huxley, MD;  Location: [place] [facility];  Service: Cardiovascular;  Laterality: Left;  . PERIPHERAL VASCULAR CATHETERIZATION Left 04/25/2015   Procedure: Lower Extremity Intervention;  Surgeon: Algernon Huxley, MD;  Location: [place] [facility];  Service: Cardiovascular;  Laterality: Left;  . PERIPHERAL VASCULAR CATHETERIZATION Left 01/02/2016   Procedure: Lower Extremity Angiography;  Surgeon: Algernon Huxley, MD;  Location: [place] [facility];  Service: Cardiovascular;  Laterality: Left;  . PERIPHERAL VASCULAR CATHETERIZATION  01/02/2016   Procedure: Lower Extremity Intervention;  Surgeon:  Algernon Huxley, MD;  Location: [place] [facility];  Service: Cardiovascular;;  . THROMBECTOMY FEMORAL ARTERY Left 04/26/2015   Procedure: THROMBECTOMY FEMORAL ARTERY;  Surgeon: Algernon Huxley, MD;  Location: [facility] ORS;  Service: Vascular;  Laterality: Left;    Prior to Admission medications   Medication Sig Start Date End Date Taking? Authorizing  Provider  apixaban (ELIQUIS) 2.5 MG TABS tablet Take 1 tablet (2.5 mg total) by mouth 2 (two) times daily. Patient not taking: Reported on 11/09/2016 04/29/15   Algernon Huxley, MD  aspirin EC 81 MG EC tablet Take 1 tablet (81 mg total) by mouth daily. 04/29/15   Algernon Huxley, MD  atorvastatin (LIPITOR) 80 MG tablet Take 80 mg by mouth every morning.  11/15/14   [provider]  budesonide-formoterol (SYMBICORT) 80-4.5 MCG/ACT inhaler Inhale 2 puffs into the lungs daily.    [provider]  cetirizine (ZYRTEC) 10 MG tablet Take 10 mg by mouth daily.    [provider]  Cholecalciferol (VITAMIN D3) 2000 units TABS Take 1 tablet by mouth daily.    [provider]  famotidine (PEPCID) 20 MG tablet Take 1 tablet (20 mg total) by mouth daily. Patient not taking: Reported on 05/14/2017 02/19/17 02/19/18  Cuthriell, Charline Bills, PA-C  feeding supplement, ENSURE ENLIVE, (ENSURE ENLIVE) LIQD Take 237 mLs by mouth 3 (three) times daily with meals. Patient not taking: Reported on 11/09/2016 02/03/15   Epifanio Lesches, MD  ferrous sulfate 325 (65 FE) MG tablet Take 325 mg by mouth daily.    [provider]  gabapentin (NEURONTIN) 300 MG capsule Take 1 capsule (300 mg total) by mouth 3 (three) times daily as needed (Nerve Pain). Patient not taking: Reported on 11/09/2016 04/29/15   Algernon Huxley, MD  HYDROcodone-acetaminophen (NORCO/VICODIN) 5-325 MG per tablet Take 1-2 tablets by mouth every 4 (four) hours as needed for moderate pain. Patient not taking: Reported on 11/09/2016 04/29/15   Algernon Huxley, MD  hydrocortisone (PROCTOSOL HC) 2.5 % rectal cream Place 1 application rectally 2 (two) times daily. Patient not taking: Reported on 11/09/2016 02/04/15   Epifanio Lesches, MD  losartan (COZAAR) 50 MG tablet Take 50 mg by mouth daily. 12/09/14   [provider]  metoprolol (LOPRESSOR) 50 MG tablet Take 100 mg by mouth daily. 01/19/15   [provider]  predniSONE  (DELTASONE) 50 MG tablet Take 1 tablet (50 mg total) by mouth daily with breakfast. Patient not taking: Reported on 05/14/2017 02/19/17   Cuthriell, Charline Bills, PA-C  pregabalin (LYRICA) 75 MG capsule Take 1 capsule (75 mg total) by mouth 2 (two) times daily. Patient not taking: Reported on 11/09/2016 04/29/15   Algernon Huxley, MD    Allergies Aspirin and Zyrtec [cetirizine]  Family History  Problem Relation Age of Onset  . Hypertension Mother   . Hypertension Father   . Breast cancer Sister 47    Social History Social History   Tobacco Use  . Smoking status: Current Every Day Smoker    Packs/day: 1.00    Years: 41.00    Pack years: 41.00    Types: Cigarettes  . Smokeless tobacco: Never Used  . Tobacco comment: pt recommend to stop smoking 4 min spent will start on prn nicotine replacment  Substance Use Topics  . Alcohol use: Yes    Alcohol/week: 0.0 oz    Comment: weekends, couple beers, pint of liquer only on weekends  . Drug use: No    Review  of Systems  Constitutional: No fever/chills. Eyes: No visual changes. ENT: No sore throat. Cardiovascular: Denies chest pain. Respiratory: Positive for dry cough.  Denies shortness of breath. Gastrointestinal: No abdominal pain.  Positive for nausea, no vomiting.  No diarrhea.  No constipation. Genitourinary: Negative for dysuria. Musculoskeletal: Negative for back pain. Skin: Negative for rash. Neurological: Negative for headaches, focal weakness or numbness.   ____________________________________________   PHYSICAL EXAM:  VITAL SIGNS: ED Triage Vitals  Enc Vitals Group     BP 09/08/17 0031 (!) 73/53     Pulse Rate 09/08/17 0031 70     Resp 09/08/17 0031 18     Temp 09/08/17 0031 (!) 97.4 F (36.3 C)     Temp Source 09/08/17 0031 Oral     SpO2 09/08/17 0031 96 %     Weight 09/08/17 0031 135 lb (61.2 kg)     Height 09/08/17 0031 5\' 3"  (1.6 m)     Head Circumference --      Peak Flow --      Pain Score 09/08/17 0029  0     Pain Loc --      Pain Edu? --      Excl. in [place]? --     Constitutional: Alert and oriented.  Disheveled appearing and in no acute distress.  Intoxicated. Eyes: Conjunctivae are normal. PERRL. EOMI. Head: Atraumatic. Nose: No congestion/rhinnorhea. Mouth/Throat: Mucous membranes are moist.  Oropharynx non-erythematous. Neck: No stridor.  No cervical spine tenderness to palpation.  No step-offs or deformities. Cardiovascular: Normal rate, regular rhythm. Grossly normal heart sounds.  Good peripheral circulation. Respiratory: Active dry cough.  Normal respiratory effort.  No retractions. Lungs CTAB. Gastrointestinal: Soft and nontender. No distention. No abdominal bruits. No CVA tenderness. Musculoskeletal: No lower extremity tenderness nor edema.  No joint effusions. Neurologic: Intoxicated.  Normal speech and language. No gross focal neurologic deficits are appreciated. MAEx4. Skin:  Skin is warm, dry and intact. No rash noted. Psychiatric: Mood and affect are normal. Speech and behavior are normal.  ____________________________________________   LABS (all labs ordered are listed, but only abnormal results are displayed)  Labs Reviewed  CBC WITH DIFFERENTIAL/PLATELET - Abnormal; Notable for the following components:      Result Value   RBC 3.67 (*)    RDW 15.3 (*)    All other components within normal limits  COMPREHENSIVE METABOLIC PANEL - Abnormal; Notable for the following components:   Potassium 3.4 (*)    CO2 19 (*)    Creatinine, Ser 1.81 (*)    Total Bilirubin <0.1 (*)    GFR calc non Af Amer 29 (*)    GFR calc Af Amer 34 (*)    All other components within normal limits  ETHANOL - Abnormal; Notable for the following components:   Alcohol, Ethyl (B) 254 (*)    All other components within normal limits  LIPASE, BLOOD  TROPONIN I  URINALYSIS, COMPLETE (UACMP) WITH MICROSCOPIC  URINE DRUG SCREEN, QUALITATIVE ([facility] ONLY)    ____________________________________________  EKG  ED ECG REPORT I, SUNG,JADE J, the attending physician, personally viewed and interpreted this ECG.   Date: 09/08/2017  EKG Time: 0040  Rate: 69  Rhythm: normal EKG, normal sinus rhythm  Axis: WNL  Intervals:none  ST&T Change: Nonspecific  ____________________________________________  RADIOLOGY  Dg Chest Port 1 View  Result Date: 09/08/2017 CLINICAL DATA:  62 y/o  F; COPD.  Productive cough. EXAM: PORTABLE CHEST 1 VIEW COMPARISON:  03/09/2015 chest radiograph.  04/13/2016 CT  chest. FINDINGS: Stable normal cardiac silhouette given projection and technique. Aortic atherosclerosis with calcification. Bronchitic changes in the lung bases. Stable right upper lobe scarring and biapical emphysema. No consolidation. No pleural effusion or pneumothorax. No acute osseous abnormality is evident. IMPRESSION: Bronchitic changes in the lung bases. COPD. Aortic atherosclerosis. Electronically Signed   By: Kristine Garbe M.D.   On: 09/08/2017 00:53    ____________________________________________   PROCEDURES  Procedure(s) performed: None  Procedures  Critical Care performed: No  ____________________________________________   INITIAL IMPRESSION / ASSESSMENT AND PLAN / ED COURSE  As part of my medical decision making, I reviewed the following data within the [place] notes reviewed and incorporated, Labs reviewed, EKG interpreted, Old chart reviewed, Radiograph reviewed and Notes from prior ED visits.   62 year old female with COPD who presents with intoxication and coughing. Differential includes, but is not limited to, viral syndrome, bronchitis including COPD exacerbation, pneumonia, reactive airway disease including asthma, CHF including exacerbation with or without pulmonary/interstitial edema, pneumothorax, ACS, thoracic trauma, and pulmonary embolism.  Patient is hypotensive on arrival.  IV  fluid resuscitation initiated.  Dry cough on exam.  Will initiate DuoNeb, prednisone and reassess.  Clinical Course as of Sep 08 716  Sun Sep 08, 2017  0251 Patient sleeping in no acute distress.  I had updated patient's daughter and spouse previously; they left to go home but will be back in the morning to pick up the patient.  She received a dose of Tussionex and has stopped coughing.  [JS]  4034 No further events overnight.  Patient continues to resting no acute distress.  Blood pressure is improved.  Anticipate discharge home when she is awake, sober and ambulatory with steady gait.  Will discharge home with prescription for Tussionex and prednisone burst.  Care transferred to Dr. Archie Balboa pending reassessment.  [JS]    Clinical Course User Index [JS] Paulette Blanch, MD     ____________________________________________   FINAL CLINICAL IMPRESSION(S) / ED DIAGNOSES  Final diagnoses:  Alcoholic intoxication without complication ([place])  ETOH abuse  Chronic obstructive pulmonary disease, unspecified COPD type [facility]     ED Discharge Orders    None       Note:  This document was prepared using Dragon voice recognition software and may include unintentional dictation errors.    Paulette Blanch, MD 09/08/17 [id number]

## 2017-09-08 NOTE — ED Notes (Signed)

## 2017-09-17 ENCOUNTER — Encounter: Payer: Self-pay | Admitting: Emergency Medicine

## 2017-09-17 ENCOUNTER — Emergency Department: Payer: Medicare Other

## 2017-09-17 ENCOUNTER — Emergency Department
Admission: EM | Admit: 2017-09-17 | Discharge: 2017-09-17 | Disposition: A | Discharge status: Home / Self Care | Payer: Medicare Other | Attending: Emergency Medicine | Admitting: Emergency Medicine

## 2017-09-17 DIAGNOSIS — Z7901 Long term (current) use of anticoagulants: Secondary | ICD-10-CM | POA: Insufficient documentation

## 2017-09-17 DIAGNOSIS — F17210 Nicotine dependence, cigarettes, uncomplicated: Secondary | ICD-10-CM | POA: Insufficient documentation

## 2017-09-17 DIAGNOSIS — Z7982 Long term (current) use of aspirin: Secondary | ICD-10-CM | POA: Insufficient documentation

## 2017-09-17 DIAGNOSIS — J45909 Unspecified asthma, uncomplicated: Secondary | ICD-10-CM | POA: Insufficient documentation

## 2017-09-17 DIAGNOSIS — Y999 Unspecified external cause status: Secondary | ICD-10-CM | POA: Insufficient documentation

## 2017-09-17 DIAGNOSIS — S161XXA Strain of muscle, fascia and tendon at neck level, initial encounter: Secondary | ICD-10-CM | POA: Insufficient documentation

## 2017-09-17 DIAGNOSIS — R55 Syncope and collapse: Secondary | ICD-10-CM | POA: Diagnosis not present

## 2017-09-17 DIAGNOSIS — M542 Cervicalgia: Secondary | ICD-10-CM | POA: Diagnosis not present

## 2017-09-17 DIAGNOSIS — Y939 Activity, unspecified: Secondary | ICD-10-CM | POA: Insufficient documentation

## 2017-09-17 DIAGNOSIS — S3991XA Unspecified injury of abdomen, initial encounter: Secondary | ICD-10-CM | POA: Diagnosis not present

## 2017-09-17 DIAGNOSIS — J449 Chronic obstructive pulmonary disease, unspecified: Secondary | ICD-10-CM | POA: Insufficient documentation

## 2017-09-17 DIAGNOSIS — S199XXA Unspecified injury of neck, initial encounter: Secondary | ICD-10-CM | POA: Diagnosis not present

## 2017-09-17 DIAGNOSIS — R42 Dizziness and giddiness: Secondary | ICD-10-CM | POA: Insufficient documentation

## 2017-09-17 DIAGNOSIS — R110 Nausea: Secondary | ICD-10-CM | POA: Insufficient documentation

## 2017-09-17 DIAGNOSIS — Y92410 Unspecified street and highway as the place of occurrence of the external cause: Secondary | ICD-10-CM | POA: Insufficient documentation

## 2017-09-17 DIAGNOSIS — S0990XA Unspecified injury of head, initial encounter: Secondary | ICD-10-CM | POA: Diagnosis not present

## 2017-09-17 DIAGNOSIS — R10816 Epigastric abdominal tenderness: Secondary | ICD-10-CM | POA: Insufficient documentation

## 2017-09-17 LAB — HEPATIC FUNCTION PANEL
ALBUMIN: 4.1 g/dL (ref 3.5–5.0)
ALT: 21 U/L (ref 14–54)
AST: 25 U/L (ref 15–41)
Alkaline Phosphatase: 69 U/L (ref 38–126)
Bilirubin, Direct: <0.1 mg/dL — ABNORMAL LOW (ref 0.1–0.5)
Total Bilirubin: 0.6 mg/dL (ref 0.3–1.2)
Total Protein: 7.4 g/dL (ref 6.5–8.1)

## 2017-09-17 LAB — CBC
HEMATOCRIT: 40.4 % (ref 35.0–47.0)
Hemoglobin: 13.5 g/dL (ref 12.0–16.0)
MCH: 32.2 pg (ref 26.0–34.0)
MCHC: 33.3 g/dL (ref 32.0–36.0)
MCV: 96.7 fL (ref 80.0–100.0)
Platelets: 195 10*3/uL (ref 150–440)
RBC: 4.18 MIL/uL (ref 3.80–5.20)
RDW: 15.5 % — ABNORMAL HIGH (ref 11.5–14.5)
WBC: 6.7 10*3/uL (ref 3.6–11.0)

## 2017-09-17 LAB — BASIC METABOLIC PANEL
Anion gap: 10 (ref 5–15)
BUN: 33 mg/dL — ABNORMAL HIGH (ref 6–20)
CO2: 26 mmol/L (ref 22–32)
Calcium: 9.3 mg/dL (ref 8.9–10.3)
Chloride: 100 mmol/L — ABNORMAL LOW (ref 101–111)
Creatinine, Ser: 1.99 mg/dL — ABNORMAL HIGH (ref 0.44–1.00)
GFR calc Af Amer: 30 mL/min — ABNORMAL LOW (ref >60)
GFR calc non Af Amer: 26 mL/min — ABNORMAL LOW (ref >60)
GLUCOSE: 102 mg/dL — AB (ref 65–99)
POTASSIUM: 4 mmol/L (ref 3.5–5.1)
Sodium: 136 mmol/L (ref 135–145)

## 2017-09-17 MED ORDER — SODIUM CHLORIDE 0.9 % IV BOLUS (SEPSIS)
1000.0000 mL | Freq: Once | INTRAVENOUS | Status: AC
  Administered 2017-09-17: 1000 mL via INTRAVENOUS

## 2017-09-17 MED ORDER — OXYCODONE-ACETAMINOPHEN 5-325 MG PO TABS
1.0000 | ORAL_TABLET | ORAL | Status: AC
  Administered 2017-09-17: 1 via ORAL
  Filled 2017-09-17: qty 1

## 2017-09-17 NOTE — ED Notes (Signed)
ED Provider at bedside. 

## 2017-09-17 NOTE — ED Notes (Signed)
Pt is a&o x4 

## 2017-09-17 NOTE — ED Notes (Signed)
Pt son on the way to come pick up pt. EDP aware.
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
## 2017-09-17 NOTE — ED Notes (Signed)
Placed C-Collar per verbal order from Dr. Jacqualine Code.
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
## 2017-10-04 ENCOUNTER — Ambulatory Visit: Payer: Self-pay | Admitting: Nurse Practitioner

## 2017-11-11 ENCOUNTER — Ambulatory Visit (INDEPENDENT_AMBULATORY_CARE_PROVIDER_SITE_OTHER): Payer: Medicare Other | Admitting: Nurse Practitioner

## 2017-11-11 ENCOUNTER — Other Ambulatory Visit: Payer: Self-pay | Admitting: Nurse Practitioner

## 2017-11-11 ENCOUNTER — Encounter: Payer: Self-pay | Admitting: Nurse Practitioner

## 2017-11-11 VITALS — BP 133/86 | HR 67 | Resp 16 | Ht 64.0 in | Wt 136.6 lb

## 2017-11-11 DIAGNOSIS — R252 Cramp and spasm: Secondary | ICD-10-CM | POA: Diagnosis not present

## 2017-11-11 DIAGNOSIS — I10 Essential (primary) hypertension: Secondary | ICD-10-CM | POA: Diagnosis not present

## 2017-11-11 DIAGNOSIS — E782 Mixed hyperlipidemia: Secondary | ICD-10-CM

## 2017-11-11 DIAGNOSIS — Z0001 Encounter for general adult medical examination with abnormal findings: Secondary | ICD-10-CM

## 2017-11-11 DIAGNOSIS — E559 Vitamin D deficiency, unspecified: Secondary | ICD-10-CM

## 2017-11-11 NOTE — Progress Notes (Signed)
[facility] [place], [place] [address]  Internal MEDICINE  Office Visit Note  Patient Name: [name]  [id number]  [id number]  Date of Service: 11/30/2017  No chief complaint on file.   The patient is here for routine follow up exam. Today, she is c/o intermittent muscle spasms across her abdomen. Happens every few days, lasts for about 5 minutes, then resolves. Does not make her short of breath. Has no nausea, vomiting, or diarrhea associated with this. Will sometimes happens when she bends over, sometimes when she coughs, and sometimes, when she is sitting still, doing nothing much.    Pt is here for routine follow up.    Current Medication: Outpatient Encounter Medications as of 11/11/2017  Medication Sig Note  . albuterol (PROVENTIL HFA;VENTOLIN HFA) 108 (90 Base) MCG/ACT inhaler Inhale 2 puffs into the lungs every 6 (six) hours as needed for wheezing or shortness of breath.   Marland Kitchen aspirin EC 81 MG EC tablet Take 1 tablet (81 mg total) by mouth daily.   Marland Kitchen atorvastatin (LIPITOR) 80 MG tablet Take 80 mg by mouth every morning.  01/22/2015: Received from: External Pharmacy Received Sig:   . budesonide-formoterol (SYMBICORT) 80-4.5 MCG/ACT inhaler Inhale 2 puffs into the lungs daily.   . cetirizine (ZYRTEC) 10 MG tablet Take 10 mg by mouth daily.   . Cholecalciferol (VITAMIN D3) 2000 units TABS Take 1 tablet by mouth daily.   . ferrous sulfate 325 (65 FE) MG tablet Take 325 mg by mouth daily.   Marland Kitchen tiZANidine (ZANAFLEX) 4 MG tablet Take 4 mg by mouth 2 (two) times daily as needed for muscle spasms. Take 1/2-1 tab po bid prn.   Marland Kitchen apixaban (ELIQUIS) 2.5 MG TABS tablet Take 1 tablet (2.5 mg total) by mouth 2 (two) times daily. (Patient not taking: Reported on 11/09/2016)   . chlorpheniramine-HYDROcodone (TUSSIONEX PENNKINETIC ER) 10-8 MG/5ML SUER Take 5 mLs by mouth 2 (two) times daily. (Patient not taking: Reported on 11/11/2017)   . famotidine (PEPCID) 20 MG tablet  Take 1 tablet (20 mg total) by mouth daily. (Patient not taking: Reported on 05/14/2017)   . feeding supplement, ENSURE ENLIVE, (ENSURE ENLIVE) LIQD Take 237 mLs by mouth 3 (three) times daily with meals. (Patient not taking: Reported on 11/09/2016)   . gabapentin (NEURONTIN) 300 MG capsule Take 1 capsule (300 mg total) by mouth 3 (three) times daily as needed (Nerve Pain). (Patient not taking: Reported on 11/09/2016)   . HYDROcodone-acetaminophen (NORCO/VICODIN) 5-325 MG per tablet Take 1-2 tablets by mouth every 4 (four) hours as needed for moderate pain. (Patient not taking: Reported on 11/09/2016)   . hydrocortisone (PROCTOSOL HC) 2.5 % rectal cream Place 1 application rectally 2 (two) times daily. (Patient not taking: Reported on 11/09/2016)   . losartan (COZAAR) 50 MG tablet Take 50 mg by mouth daily. 01/22/2015: Received from: External Pharmacy Received Sig:   . metoprolol (LOPRESSOR) 50 MG tablet Take 100 mg by mouth daily. 01/22/2015: Received from: External Pharmacy Received Sig:   . predniSONE (DELTASONE) 20 MG tablet 3 tablets PO qd x 4 days (Patient not taking: Reported on 11/11/2017)   . pregabalin (LYRICA) 75 MG capsule Take 1 capsule (75 mg total) by mouth 2 (two) times daily. (Patient not taking: Reported on 11/09/2016)    No facility-administered encounter medications on file as of 11/11/2017.     Surgical History: Past Surgical History:  Procedure Laterality Date  . ABDOMINAL HYSTERECTOMY    . APPENDECTOMY    .  CARDIAC CATHETERIZATION    . CARDIAC SURGERY    . CHOLECYSTECTOMY    . COLONOSCOPY  12/15/11   OH->bleeding internal hemorrhoids, otherwise normal  . COLONOSCOPY WITH PROPOFOL N/A 04/12/2015   Procedure: COLONOSCOPY WITH PROPOFOL;  Surgeon: Lucilla Lame, MD;  Location: [facility];  Service: Endoscopy;  Laterality: N/A;  . ENDARTERECTOMY FEMORAL Left 04/26/2015   Procedure: ENDARTERECTOMY FEMORAL;  Surgeon: Algernon Huxley, MD;  Location: [facility];  Service: Vascular;  Laterality:  Left;  . ENDOVASCULAR STENT INSERTION Bilateral    Legs  . FLEXIBLE BRONCHOSCOPY N/A 02/02/2015   Procedure: FLEXIBLE BRONCHOSCOPY;  Surgeon: Allyne Gee, MD;  Location: [facility];  Service: Pulmonary;  Laterality: N/A;  . HEMORRHOID SURGERY    . PERIPHERAL VASCULAR CATHETERIZATION Left 04/25/2015   Procedure: Lower Extremity Angiography;  Surgeon: Algernon Huxley, MD;  Location: [place] [facility];  Service: Cardiovascular;  Laterality: Left;  . PERIPHERAL VASCULAR CATHETERIZATION Left 04/25/2015   Procedure: Lower Extremity Intervention;  Surgeon: Algernon Huxley, MD;  Location: [place] [facility];  Service: Cardiovascular;  Laterality: Left;  . PERIPHERAL VASCULAR CATHETERIZATION Left 01/02/2016   Procedure: Lower Extremity Angiography;  Surgeon: Algernon Huxley, MD;  Location: [place] [facility];  Service: Cardiovascular;  Laterality: Left;  . PERIPHERAL VASCULAR CATHETERIZATION  01/02/2016   Procedure: Lower Extremity Intervention;  Surgeon: Algernon Huxley, MD;  Location: [place] [facility];  Service: Cardiovascular;;  . THROMBECTOMY FEMORAL ARTERY Left 04/26/2015   Procedure: THROMBECTOMY FEMORAL ARTERY;  Surgeon: Algernon Huxley, MD;  Location: [facility];  Service: Vascular;  Laterality: Left;    Medical History: Past Medical History:  Diagnosis Date  . Allergy    Environmental  . Arthritis   . Asthma   . Carotid artery stenosis   . Carotid stenosis   . COPD (chronic obstructive pulmonary disease) ([place])   . Coronary artery disease   . Coronary artery disease   . Hemorrhoids   . Hyperlipidemia   . Hypertension   . 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[facility])    negative cardiac cath  . Nicotine addiction   . Peripheral vascular disease ([place])   . Pneumonia   . Shortness of breath dyspnea   . Tobacco abuse     Family History: Family History  Problem Relation Age of Onset  . Hypertension Mother   . Hypertension Father   . Breast cancer Sister 69    Social History   Socioeconomic History  . Marital  status: Single    Spouse name: Not on file  . Number of children: Not on file  . Years of education: Not on file  . Highest education level: Not on file  Occupational History  . Not on file  Social Needs  . Financial resource strain: Not on file  . Food insecurity:    Worry: Not on file    Inability: Not on file  . Transportation needs:    Medical: Not on file    Non-medical: Not on file  Tobacco Use  . Smoking status: Current Every Day Smoker    Packs/day: 1.00    Years: 41.00    Pack years: 41.00    Types: Cigarettes  . Smokeless tobacco: Never Used  . Tobacco comment: pt recommend to stop smoking 4 min spent will start on prn nicotine replacment  Substance and Sexual Activity  . Alcohol use: Yes    Alcohol/week: 0.0 oz    Comment: weekends, couple beers, pint of liquer only on  weekends  . Drug use: No  . Sexual activity: Not on file  Lifestyle  . Physical activity:    Days per week: Not on file    Minutes per session: Not on file  . Stress: Not on file  Relationships  . Social connections:    Talks on phone: Not on file    Gets together: Not on file    Attends religious service: Not on file    Active member of club or organization: Not on file    Attends meetings of clubs or organizations: Not on file    Relationship status: Not on file  . Intimate partner violence:    Fear of current or ex partner: Not on file    Emotionally abused: Not on file    Physically abused: Not on file    Forced sexual activity: Not on file  Other Topics Concern  . Not on file  Social History Narrative  . Not on file      Review of Systems  Constitutional: Negative for activity change, chills, fatigue and unexpected weight change.  HENT: Negative for congestion, postnasal drip, rhinorrhea, sneezing, sore throat and voice change.   Eyes: Negative.  Negative for redness.  Respiratory: Negative for cough, chest tightness, shortness of breath and wheezing.   Cardiovascular:  Negative for chest pain and palpitations.  Gastrointestinal: Negative for abdominal pain, constipation, diarrhea, nausea and vomiting.  Endocrine: Negative for cold intolerance, heat intolerance, polydipsia, polyphagia and polyuria.  Genitourinary: Negative.  Negative for dysuria and frequency.  Musculoskeletal: Positive for myalgias. Negative for arthralgias, back pain, joint swelling and neck pain.  Skin: Negative for rash.  Neurological: Negative.  Negative for tremors and numbness.  Hematological: Negative for adenopathy. Does not bruise/bleed easily.  Psychiatric/Behavioral: Negative for behavioral problems (Depression), sleep disturbance and suicidal ideas. The patient is not nervous/anxious.     Today's Vitals   11/11/17 1023  BP: 133/86  Pulse: 67  Resp: 16  SpO2: 94%  Weight: 136 lb 9.6 oz (62 kg)  Height: 5\' 4"  (1.626 m)    Physical Exam  Constitutional: She is oriented to person, place, and time. She appears well-developed and well-nourished. No distress.  HENT:  Head: Normocephalic and atraumatic.  Mouth/Throat: Oropharynx is clear and moist. No oropharyngeal exudate.  Eyes: Pupils are equal, round, and reactive to light. EOM are normal.  Neck: Normal range of motion. Neck supple. No JVD present. No tracheal deviation present. No thyromegaly present.  Cardiovascular: Normal rate, regular rhythm and normal heart sounds. Exam reveals no gallop and no friction rub.  No murmur heard. Pulmonary/Chest: Effort normal and breath sounds normal. No respiratory distress. She has no wheezes. She has no rales. She exhibits no tenderness.  Abdominal: Soft. Bowel sounds are normal. There is no tenderness.  Musculoskeletal: Normal range of motion.  Lymphadenopathy:    She has no cervical adenopathy.  Neurological: She is alert and oriented to person, place, and time. No cranial nerve deficit.  Skin: Skin is warm and dry. She is not diaphoretic.  Psychiatric: She has a normal mood and  affect. Her behavior is normal. Judgment and thought content normal.  Nursing note and vitals reviewed.  Assessment/Plan: 1. Essential hypertension Stable. Continue bp medication as prescribed  - CBC with Differential/Platelet - Comprehensive metabolic panel - T4, free - TSH - Lipid panel  2. Mixed hyperlipidemia Check fasting lipid panel. Adjust atorvastatin as indicated.   3. Muscle cramps Will check electrolytes with cmp. Advised she increase  her water intake.   4. Vitamin D deficiency Check vitamin d level.  - Vitamin D 1,25 dihydroxy  5. Encounter for general adult medical examination with abnormal findings - CBC with Differential/Platelet   General Counseling: Malerie verbalizes understanding of the findings of todays visit and agrees with plan of treatment. I have discussed any further diagnostic evaluation that may be needed or ordered today. We also reviewed her medications today. she has been encouraged to call the office with any questions or concerns that should arise related to todays visit.   Hypertension Counseling:   The following hypertensive lifestyle modification were recommended and discussed:  1. Limiting alcohol intake to less than 1 oz/day of ethanol:(24 oz of beer or 8 oz of wine or 2 oz of 100-proof whiskey). 2. Take baby ASA 81 mg daily. 3. Importance of regular aerobic exercise and losing weight. 4. Reduce dietary saturated fat and cholesterol intake for overall cardiovascular health. 5. Maintaining adequate dietary potassium, calcium, and magnesium intake. 6. Regular monitoring of the blood pressure. 7. Reduce sodium intake to less than 100 mmol/day (less than 2.3 gm of sodium or less than 6 gm of sodium choride)   This patient was seen by Leretha Pol, FNP- C in Collaboration with Dr Lavera Guise as a part of collaborative care agreement   Orders Placed This Encounter  Procedures  . CBC with Differential/Platelet  . Comprehensive metabolic  panel  . T4, free  . TSH  . Lipid panel  . Vitamin D 1,25 dihydroxy      Time spent 20 Minutes      Dr Lavera Guise Internal medicine

## 2017-11-12 LAB — COMPREHENSIVE METABOLIC PANEL
ALBUMIN: 4.9 g/dL — AB (ref 3.6–4.8)
ALT: 22 IU/L (ref 0–32)
AST: 23 IU/L (ref 0–40)
Albumin/Globulin Ratio: 1.6 (ref 1.2–2.2)
Alkaline Phosphatase: 71 IU/L (ref 39–117)
BUN / CREAT RATIO: 19 (ref 12–28)
BUN: 25 mg/dL (ref 8–27)
Bilirubin Total: 0.3 mg/dL (ref 0.0–1.2)
CO2: 19 mmol/L — AB (ref 20–29)
CREATININE: 1.33 mg/dL — AB (ref 0.57–1.00)
Calcium: 10 mg/dL (ref 8.7–10.3)
Chloride: 101 mmol/L (ref 96–106)
GFR calc non Af Amer: 43 mL/min/{1.73_m2} — ABNORMAL LOW (ref >59)
GFR, EST AFRICAN AMERICAN: 49 mL/min/{1.73_m2} — AB (ref >59)
GLUCOSE: 107 mg/dL — AB (ref 65–99)
Globulin, Total: 3 g/dL (ref 1.5–4.5)
Potassium: 4.3 mmol/L (ref 3.5–5.2)
Sodium: 139 mmol/L (ref 134–144)
TOTAL PROTEIN: 7.9 g/dL (ref 6.0–8.5)

## 2017-11-12 LAB — LIPID PANEL W/O CHOL/HDL RATIO
Cholesterol, Total: 167 mg/dL (ref 100–199)
HDL: 59 mg/dL (ref >39)
LDL CALC: 81 mg/dL (ref 0–99)
Triglycerides: 133 mg/dL (ref 0–149)
VLDL Cholesterol Cal: 27 mg/dL (ref 5–40)

## 2017-11-12 LAB — VITAMIN D 25 HYDROXY (VIT D DEFICIENCY, FRACTURES): Vit D, 25-Hydroxy: 30.2 ng/mL (ref 30.0–100.0)

## 2017-11-12 LAB — CBC
HEMATOCRIT: 39.2 % (ref 34.0–46.6)
Hemoglobin: 13.3 g/dL (ref 11.1–15.9)
MCH: 32.1 pg (ref 26.6–33.0)
MCHC: 33.9 g/dL (ref 31.5–35.7)
MCV: 95 fL (ref 79–97)
Platelets: 193 10*3/uL (ref 150–379)
RBC: 4.14 x10E6/uL (ref 3.77–5.28)
RDW: 14.4 % (ref 12.3–15.4)
WBC: 5.9 10*3/uL (ref 3.4–10.8)

## 2017-11-12 LAB — TSH: TSH: 0.933 u[IU]/mL (ref 0.450–4.500)

## 2017-11-12 LAB — T4, FREE: FREE T4: 1.19 ng/dL (ref 0.82–1.77)

## 2017-11-30 DIAGNOSIS — E782 Mixed hyperlipidemia: Secondary | ICD-10-CM | POA: Insufficient documentation

## 2017-11-30 DIAGNOSIS — Z0000 Encounter for general adult medical examination without abnormal findings: Secondary | ICD-10-CM | POA: Insufficient documentation

## 2017-11-30 DIAGNOSIS — R252 Cramp and spasm: Secondary | ICD-10-CM | POA: Insufficient documentation

## 2017-11-30 DIAGNOSIS — E559 Vitamin D deficiency, unspecified: Secondary | ICD-10-CM | POA: Insufficient documentation

## 2017-12-23 ENCOUNTER — Ambulatory Visit (INDEPENDENT_AMBULATORY_CARE_PROVIDER_SITE_OTHER): Payer: Medicare Other | Admitting: Internal Medicine

## 2017-12-23 ENCOUNTER — Encounter: Payer: Self-pay | Admitting: Internal Medicine

## 2017-12-23 VITALS — BP 118/78 | HR 78 | Resp 16 | Ht 64.0 in | Wt 133.6 lb

## 2017-12-23 DIAGNOSIS — J449 Chronic obstructive pulmonary disease, unspecified: Secondary | ICD-10-CM

## 2017-12-23 DIAGNOSIS — F17219 Nicotine dependence, cigarettes, with unspecified nicotine-induced disorders: Secondary | ICD-10-CM

## 2017-12-23 DIAGNOSIS — R0602 Shortness of breath: Secondary | ICD-10-CM

## 2017-12-23 DIAGNOSIS — G894 Chronic pain syndrome: Secondary | ICD-10-CM | POA: Diagnosis not present

## 2017-12-23 DIAGNOSIS — R911 Solitary pulmonary nodule: Secondary | ICD-10-CM
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
## 2017-12-23 NOTE — Patient Instructions (Signed)

## 2017-12-25 ENCOUNTER — Ambulatory Visit (INDEPENDENT_AMBULATORY_CARE_PROVIDER_SITE_OTHER): Payer: Medicare Other | Admitting: Internal Medicine

## 2017-12-25 DIAGNOSIS — R0602 Shortness of breath: Secondary | ICD-10-CM | POA: Diagnosis not present

## 2017-12-25 LAB — PULMONARY FUNCTION TEST

## 2018-01-06 NOTE — Procedures (Signed)
[place], [id number]  DATE OF SERVICE: December 25, 2017  Complete Pulmonary Function Testing Interpretation:  FINDINGS:   forced vital capacity is normal.  The FEV1 is normal.  FEV1 FVC ratio is mildly reduced.  Post bronchodilator there is no significant change in the FEV1 however clinical improvement may occur in the absence as per med trach improvement and does not preclude the use of bronchodilators.   Total lung capacity is increased residual volume is increased residual volume total lung capacity ratio is increased the FRC is increased. DLCO is severely reduced  IMPRESSION:   the spirometry and lung volumes indicate normal spirometry and increased lung volumes consistent with hyperinflation.   DLCO is severely reduced which may be secondary to patient's history of smoking.  Allyne Gee, MD [facility] Pulmonary Critical Care Medicine Sleep Medicine

## 2018-03-17 ENCOUNTER — Encounter: Payer: Self-pay | Admitting: Emergency Medicine

## 2018-03-17 ENCOUNTER — Other Ambulatory Visit: Payer: Self-pay

## 2018-03-17 ENCOUNTER — Emergency Department: Payer: Medicare Other

## 2018-03-17 ENCOUNTER — Ambulatory Visit: Admission: RE | Admit: 2018-03-17 | Payer: Medicare Other | Source: Ambulatory Visit

## 2018-03-17 ENCOUNTER — Emergency Department
Admission: EM | Admit: 2018-03-17 | Discharge: 2018-03-17 | Disposition: A | Discharge status: Home / Self Care | Payer: Medicare Other | Attending: Student in an Organized Health Care Education/Training Program | Admitting: Student in an Organized Health Care Education/Training Program

## 2018-03-17 DIAGNOSIS — Z7982 Long term (current) use of aspirin: Secondary | ICD-10-CM | POA: Diagnosis not present

## 2018-03-17 DIAGNOSIS — S299XXA Unspecified injury of thorax, initial encounter: Secondary | ICD-10-CM | POA: Insufficient documentation

## 2018-03-17 DIAGNOSIS — M542 Cervicalgia: Secondary | ICD-10-CM | POA: Diagnosis not present

## 2018-03-17 DIAGNOSIS — Y9389 Activity, other specified: Secondary | ICD-10-CM | POA: Diagnosis not present

## 2018-03-17 DIAGNOSIS — J449 Chronic obstructive pulmonary disease, unspecified: Secondary | ICD-10-CM | POA: Diagnosis not present

## 2018-03-17 DIAGNOSIS — Z7901 Long term (current) use of anticoagulants: Secondary | ICD-10-CM | POA: Diagnosis not present

## 2018-03-17 DIAGNOSIS — M7918 Myalgia, other site: Secondary | ICD-10-CM | POA: Diagnosis not present

## 2018-03-17 DIAGNOSIS — S1191XA Laceration without foreign body of unspecified part of neck, initial encounter: Secondary | ICD-10-CM | POA: Insufficient documentation

## 2018-03-17 DIAGNOSIS — S199XXA Unspecified injury of neck, initial encounter: Secondary | ICD-10-CM | POA: Diagnosis not present

## 2018-03-17 DIAGNOSIS — I259 Chronic ischemic heart disease, unspecified: Secondary | ICD-10-CM | POA: Insufficient documentation

## 2018-03-17 DIAGNOSIS — I10 Essential (primary) hypertension: Secondary | ICD-10-CM | POA: Diagnosis not present

## 2018-03-17 DIAGNOSIS — Z79899 Other long term (current) drug therapy: Secondary | ICD-10-CM | POA: Diagnosis not present

## 2018-03-17 DIAGNOSIS — F17210 Nicotine dependence, cigarettes, uncomplicated: Secondary | ICD-10-CM | POA: Insufficient documentation

## 2018-03-17 DIAGNOSIS — S0990XA Unspecified injury of head, initial encounter: Secondary | ICD-10-CM | POA: Diagnosis not present

## 2018-03-17 DIAGNOSIS — Y92410 Unspecified street and highway as the place of occurrence of the external cause: Secondary | ICD-10-CM | POA: Insufficient documentation

## 2018-03-17 DIAGNOSIS — R51 Headache: Secondary | ICD-10-CM | POA: Diagnosis not present

## 2018-03-17 DIAGNOSIS — R079 Chest pain, unspecified: Secondary | ICD-10-CM | POA: Diagnosis not present

## 2018-03-17 DIAGNOSIS — Y999 Unspecified external cause status: Secondary | ICD-10-CM | POA: Insufficient documentation

## 2018-03-17 MED ORDER — CYCLOBENZAPRINE HCL 5 MG PO TABS: 5.0000 mg | ORAL_TABLET | Freq: Three times a day (TID) | ORAL | 0 refills | Status: DC | PRN

## 2018-03-17 MED ORDER — ACETAMINOPHEN 500 MG PO TABS
1000.0000 mg | ORAL_TABLET | Freq: Once | ORAL | Status: AC
  Administered 2018-03-17: 1000 mg via ORAL
  Filled 2018-03-17: qty 2

## 2018-03-17 MED ORDER — FENTANYL CITRATE (PF) 100 MCG/2ML IJ SOLN
50.0000 ug | INTRAMUSCULAR | Status: DC | PRN
  Administered 2018-03-17: 50 ug via INTRAVENOUS
  Filled 2018-03-17: qty 2

## 2018-03-17 MED ORDER — HYDROCODONE-ACETAMINOPHEN 5-325 MG PO TABS: 1.0000 | ORAL_TABLET | ORAL | 0 refills | Status: DC | PRN
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
## 2018-03-17 NOTE — ED Notes (Signed)
Esign not working pt verbalized discharge instructions and has no questions at this time 

## 2018-03-17 NOTE — ED Notes (Signed)
c collar placed on pt

## 2018-03-17 NOTE — ED Notes (Signed)
Patient transported to CT 

## 2018-03-17 NOTE — Patient Outreach (Signed)
[facility]) Care Management  03/17/2018  [name] 09-05-1955 [id number]   Medication Adherence call to [name] spoke with patient she said she pick up Olmesartan/Amlodepine/hctz today 7/15 for a 90 days supply.[name] is showing past due under [facility] ins.  [place] Management Direct Dial [phone]  Fax [phone] [name]
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
## 2018-03-18 ENCOUNTER — Other Ambulatory Visit: Payer: Self-pay

## 2018-03-20 ENCOUNTER — Encounter: Payer: Self-pay | Admitting: Nurse Practitioner

## 2018-03-20 ENCOUNTER — Ambulatory Visit (INDEPENDENT_AMBULATORY_CARE_PROVIDER_SITE_OTHER): Payer: Medicare Other | Admitting: Nurse Practitioner

## 2018-03-20 VITALS — BP 111/69 | HR 74 | Resp 16 | Ht 64.0 in | Wt 130.4 lb

## 2018-03-20 DIAGNOSIS — E782 Mixed hyperlipidemia: Secondary | ICD-10-CM

## 2018-03-20 DIAGNOSIS — I10 Essential (primary) hypertension: Secondary | ICD-10-CM

## 2018-03-20 DIAGNOSIS — S134XXA Sprain of ligaments of cervical spine, initial encounter: Secondary | ICD-10-CM

## 2018-03-20 MED ORDER — OXYCODONE-ACETAMINOPHEN 5-325 MG PO TABS: 1.0000 | ORAL_TABLET | Freq: Three times a day (TID) | ORAL | 0 refills | Status: DC | PRN
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
## 2018-03-24 ENCOUNTER — Encounter: Payer: Self-pay | Admitting: Internal Medicine

## 2018-03-24 ENCOUNTER — Ambulatory Visit (INDEPENDENT_AMBULATORY_CARE_PROVIDER_SITE_OTHER): Payer: Medicare Other | Admitting: Internal Medicine

## 2018-03-24 VITALS — BP 92/68 | HR 87 | Resp 16 | Ht 64.0 in | Wt 129.8 lb

## 2018-03-24 DIAGNOSIS — I2583 Coronary atherosclerosis due to lipid rich plaque: Secondary | ICD-10-CM

## 2018-03-24 DIAGNOSIS — J449 Chronic obstructive pulmonary disease, unspecified: Secondary | ICD-10-CM | POA: Diagnosis not present

## 2018-03-24 DIAGNOSIS — R0602 Shortness of breath: Secondary | ICD-10-CM | POA: Diagnosis not present

## 2018-03-24 DIAGNOSIS — I2510 Atherosclerotic heart disease of native coronary artery without angina pectoris: Secondary | ICD-10-CM | POA: Diagnosis not present

## 2018-03-24 DIAGNOSIS — I739 Peripheral vascular disease, unspecified: Secondary | ICD-10-CM | POA: Diagnosis not present

## 2018-03-24 NOTE — Progress Notes (Signed)
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 30min  I have personally obtained a history, examined the patient, evaluated laboratory and imaging results, formulated the assessment and plan and placed orders.    Allyne Gee, MD [facility] Pulmonary and Critical Care Sleep medicine

## 2018-03-24 NOTE — Patient Instructions (Signed)

## 2018-04-01 ENCOUNTER — Encounter: Payer: Self-pay | Admitting: Adult Health

## 2018-04-01 ENCOUNTER — Ambulatory Visit (INDEPENDENT_AMBULATORY_CARE_PROVIDER_SITE_OTHER): Payer: Medicare Other | Admitting: Adult Health

## 2018-04-01 VITALS — BP 143/78 | HR 66 | Temp 97.1°F | Resp 16 | Ht 64.0 in | Wt 127.6 lb

## 2018-04-01 DIAGNOSIS — G43919 Migraine, unspecified, intractable, without status migrainosus: Secondary | ICD-10-CM | POA: Diagnosis not present

## 2018-04-01 DIAGNOSIS — S134XXA Sprain of ligaments of cervical spine, initial encounter: Secondary | ICD-10-CM | POA: Diagnosis not present
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
## 2018-04-01 NOTE — Patient Instructions (Signed)

## 2018-04-04 ENCOUNTER — Emergency Department: Payer: Medicare Other

## 2018-04-04 ENCOUNTER — Other Ambulatory Visit: Payer: Self-pay

## 2018-04-04 ENCOUNTER — Inpatient Hospital Stay
Admission: EM | Admit: 2018-04-04 | Discharge: 2018-04-06 | DRG: 312 | Disposition: A | Discharge status: Home Health | Payer: Medicare Other | Attending: Internal Medicine | Admitting: Internal Medicine

## 2018-04-04 DIAGNOSIS — Z886 Allergy status to analgesic agent status: Secondary | ICD-10-CM

## 2018-04-04 DIAGNOSIS — Y92410 Unspecified street and highway as the place of occurrence of the external cause: Secondary | ICD-10-CM | POA: Diagnosis not present

## 2018-04-04 DIAGNOSIS — I2510 Atherosclerotic heart disease of native coronary artery without angina pectoris: Secondary | ICD-10-CM | POA: Diagnosis present

## 2018-04-04 DIAGNOSIS — I951 Orthostatic hypotension: Principal | ICD-10-CM | POA: Diagnosis present

## 2018-04-04 DIAGNOSIS — Z79899 Other long term (current) drug therapy: Secondary | ICD-10-CM

## 2018-04-04 DIAGNOSIS — I6529 Occlusion and stenosis of unspecified carotid artery: Secondary | ICD-10-CM | POA: Diagnosis present

## 2018-04-04 DIAGNOSIS — E860 Dehydration: Secondary | ICD-10-CM | POA: Diagnosis present

## 2018-04-04 DIAGNOSIS — J449 Chronic obstructive pulmonary disease, unspecified: Secondary | ICD-10-CM | POA: Diagnosis not present

## 2018-04-04 DIAGNOSIS — Z79890 Hormone replacement therapy (postmenopausal): Secondary | ICD-10-CM | POA: Diagnosis not present

## 2018-04-04 DIAGNOSIS — I129 Hypertensive chronic kidney disease with stage 1 through stage 4 chronic kidney disease, or unspecified chronic kidney disease: Secondary | ICD-10-CM | POA: Diagnosis present

## 2018-04-04 DIAGNOSIS — R55 Syncope and collapse: Secondary | ICD-10-CM | POA: Diagnosis present

## 2018-04-04 DIAGNOSIS — F17210 Nicotine dependence, cigarettes, uncomplicated: Secondary | ICD-10-CM | POA: Diagnosis present

## 2018-04-04 DIAGNOSIS — Z7901 Long term (current) use of anticoagulants: Secondary | ICD-10-CM | POA: Diagnosis not present

## 2018-04-04 DIAGNOSIS — Z7982 Long term (current) use of aspirin: Secondary | ICD-10-CM

## 2018-04-04 DIAGNOSIS — R1111 Vomiting without nausea: Secondary | ICD-10-CM | POA: Diagnosis not present

## 2018-04-04 DIAGNOSIS — G44229 Chronic tension-type headache, not intractable: Secondary | ICD-10-CM | POA: Diagnosis not present

## 2018-04-04 DIAGNOSIS — S3991XA Unspecified injury of abdomen, initial encounter: Secondary | ICD-10-CM | POA: Diagnosis not present

## 2018-04-04 DIAGNOSIS — R519 Headache, unspecified: Secondary | ICD-10-CM

## 2018-04-04 DIAGNOSIS — R109 Unspecified abdominal pain: Secondary | ICD-10-CM | POA: Diagnosis not present

## 2018-04-04 DIAGNOSIS — N183 Chronic kidney disease, stage 3 (moderate): Secondary | ICD-10-CM | POA: Diagnosis present

## 2018-04-04 DIAGNOSIS — R51 Headache: Secondary | ICD-10-CM

## 2018-04-04 DIAGNOSIS — I739 Peripheral vascular disease, unspecified: Secondary | ICD-10-CM | POA: Diagnosis not present

## 2018-04-04 DIAGNOSIS — S0990XA Unspecified injury of head, initial encounter: Secondary | ICD-10-CM | POA: Diagnosis not present

## 2018-04-04 DIAGNOSIS — N179 Acute kidney failure, unspecified: Secondary | ICD-10-CM | POA: Diagnosis not present

## 2018-04-04 DIAGNOSIS — I252 Old myocardial infarction: Secondary | ICD-10-CM

## 2018-04-04 DIAGNOSIS — G44209 Tension-type headache, unspecified, not intractable: Secondary | ICD-10-CM | POA: Diagnosis not present

## 2018-04-04 DIAGNOSIS — R4182 Altered mental status, unspecified: Secondary | ICD-10-CM | POA: Diagnosis present

## 2018-04-04 DIAGNOSIS — Z888 Allergy status to other drugs, medicaments and biological substances status: Secondary | ICD-10-CM

## 2018-04-04 DIAGNOSIS — I6523 Occlusion and stenosis of bilateral carotid arteries: Secondary | ICD-10-CM | POA: Diagnosis not present

## 2018-04-04 DIAGNOSIS — Z8249 Family history of ischemic heart disease and other diseases of the circulatory system: Secondary | ICD-10-CM

## 2018-04-04 DIAGNOSIS — I959 Hypotension, unspecified: Secondary | ICD-10-CM | POA: Diagnosis present

## 2018-04-04 DIAGNOSIS — M542 Cervicalgia: Secondary | ICD-10-CM | POA: Diagnosis present

## 2018-04-04 DIAGNOSIS — E785 Hyperlipidemia, unspecified: Secondary | ICD-10-CM | POA: Diagnosis not present

## 2018-04-04 DIAGNOSIS — R410 Disorientation, unspecified: Secondary | ICD-10-CM | POA: Diagnosis not present

## 2018-04-04 DIAGNOSIS — R4020 Unspecified coma: Secondary | ICD-10-CM | POA: Diagnosis not present

## 2018-04-04 LAB — CBC WITH DIFFERENTIAL/PLATELET
BASOS PCT: 1 %
Basophils Absolute: 0.1 10*3/uL (ref 0–0.1)
EOS ABS: 0.1 10*3/uL (ref 0–0.7)
EOS PCT: 1 %
HCT: 39 % (ref 35.0–47.0)
HEMOGLOBIN: 13 g/dL (ref 12.0–16.0)
Lymphocytes Relative: 26 %
Lymphs Abs: 1.8 10*3/uL (ref 1.0–3.6)
MCH: 32.8 pg (ref 26.0–34.0)
MCHC: 33.4 g/dL (ref 32.0–36.0)
MCV: 98.2 fL (ref 80.0–100.0)
Monocytes Absolute: 0.5 10*3/uL (ref 0.2–0.9)
Monocytes Relative: 7 %
NEUTROS PCT: 65 %
Neutro Abs: 4.4 10*3/uL (ref 1.4–6.5)
PLATELETS: 203 10*3/uL (ref 150–440)
RBC: 3.97 MIL/uL (ref 3.80–5.20)
RDW: 14.3 % (ref 11.5–14.5)
WBC: 6.7 10*3/uL (ref 3.6–11.0)

## 2018-04-04 LAB — COMPREHENSIVE METABOLIC PANEL
ALBUMIN: 4.5 g/dL (ref 3.5–5.0)
ALT: 17 U/L (ref 0–44)
ANION GAP: 11 (ref 5–15)
AST: 19 U/L (ref 15–41)
Alkaline Phosphatase: 63 U/L (ref 38–126)
BUN: 29 mg/dL — ABNORMAL HIGH (ref 8–23)
CO2: 20 mmol/L — AB (ref 22–32)
Calcium: 9.3 mg/dL (ref 8.9–10.3)
Chloride: 106 mmol/L (ref 98–111)
Creatinine, Ser: 2.13 mg/dL — ABNORMAL HIGH (ref 0.44–1.00)
GFR calc Af Amer: 27 mL/min — ABNORMAL LOW (ref >60)
GFR calc non Af Amer: 24 mL/min — ABNORMAL LOW (ref >60)
GLUCOSE: 91 mg/dL (ref 70–99)
POTASSIUM: 4 mmol/L (ref 3.5–5.1)
Sodium: 137 mmol/L (ref 135–145)
TOTAL PROTEIN: 7.7 g/dL (ref 6.5–8.1)
Total Bilirubin: 0.5 mg/dL (ref 0.3–1.2)

## 2018-04-04 LAB — TROPONIN I: Troponin I: <0.03 ng/mL (ref <0.03)

## 2018-04-04 MED ORDER — FLUTICASONE FUROATE-VILANTEROL 100-25 MCG/INH IN AEPB
1.0000 | INHALATION_SPRAY | Freq: Every day | RESPIRATORY_TRACT | Status: DC
  Administered 2018-04-05 – 2018-04-06 (×2): 1 via RESPIRATORY_TRACT
  Filled 2018-04-04: qty 28

## 2018-04-04 MED ORDER — EZETIMIBE 10 MG PO TABS
10.0000 mg | ORAL_TABLET | Freq: Every day | ORAL | Status: DC
  Administered 2018-04-05 – 2018-04-06 (×2): 10 mg via ORAL
  Filled 2018-04-04 (×2): qty 1

## 2018-04-04 MED ORDER — TRAZODONE HCL 50 MG PO TABS: 25.0000 mg | ORAL_TABLET | Freq: Every evening | ORAL | Status: DC | PRN

## 2018-04-04 MED ORDER — DOCUSATE SODIUM 100 MG PO CAPS
100.0000 mg | ORAL_CAPSULE | Freq: Two times a day (BID) | ORAL | Status: DC
  Administered 2018-04-05 – 2018-04-06 (×3): 100 mg via ORAL
  Filled 2018-04-04 (×3): qty 1

## 2018-04-04 MED ORDER — LORATADINE 10 MG PO TABS
10.0000 mg | ORAL_TABLET | Freq: Every day | ORAL | Status: DC
  Administered 2018-04-05 – 2018-04-06 (×2): 10 mg via ORAL
  Filled 2018-04-04 (×2): qty 1

## 2018-04-04 MED ORDER — ACETAMINOPHEN 325 MG PO TABS
650.0000 mg | ORAL_TABLET | Freq: Four times a day (QID) | ORAL | Status: DC | PRN
  Administered 2018-04-04: 650 mg via ORAL

## 2018-04-04 MED ORDER — SODIUM CHLORIDE 0.9 % IV SOLN
INTRAVENOUS | Status: DC
  Administered 2018-04-04 – 2018-04-06 (×3): via INTRAVENOUS

## 2018-04-04 MED ORDER — SODIUM CHLORIDE 0.9 % IV BOLUS
1000.0000 mL | Freq: Once | INTRAVENOUS | Status: AC
  Administered 2018-04-04: 1000 mL via INTRAVENOUS

## 2018-04-04 MED ORDER — ACETAMINOPHEN 650 MG RE SUPP: 650.0000 mg | Freq: Four times a day (QID) | RECTAL | Status: DC | PRN

## 2018-04-04 MED ORDER — BISACODYL 5 MG PO TBEC: 5.0000 mg | DELAYED_RELEASE_TABLET | Freq: Every day | ORAL | Status: DC | PRN

## 2018-04-04 MED ORDER — VITAMIN D3 25 MCG (1000 UNIT) PO TABS
1000.0000 [IU] | ORAL_TABLET | Freq: Every day | ORAL | Status: DC
  Administered 2018-04-05 – 2018-04-06 (×2): 1000 [IU] via ORAL
  Filled 2018-04-04 (×4): qty 1

## 2018-04-04 MED ORDER — ONDANSETRON HCL 4 MG/2ML IJ SOLN: 4.0000 mg | Freq: Four times a day (QID) | INTRAMUSCULAR | Status: DC | PRN

## 2018-04-04 MED ORDER — ALBUTEROL SULFATE (2.5 MG/3ML) 0.083% IN NEBU: 2.5000 mg | INHALATION_SOLUTION | Freq: Four times a day (QID) | RESPIRATORY_TRACT | Status: DC | PRN

## 2018-04-04 MED ORDER — HEPARIN SODIUM (PORCINE) 5000 UNIT/ML IJ SOLN
5000.0000 [IU] | Freq: Three times a day (TID) | INTRAMUSCULAR | Status: DC
  Administered 2018-04-05 – 2018-04-06 (×5): 5000 [IU] via SUBCUTANEOUS
  Filled 2018-04-04 (×6): qty 1

## 2018-04-04 MED ORDER — FERROUS SULFATE 325 (65 FE) MG PO TABS
325.0000 mg | ORAL_TABLET | Freq: Every day | ORAL | Status: DC
  Administered 2018-04-05 – 2018-04-06 (×2): 325 mg via ORAL
  Filled 2018-04-04 (×2): qty 1

## 2018-04-04 MED ORDER — ACETAMINOPHEN 325 MG PO TABS
ORAL_TABLET | ORAL | Status: AC
  Administered 2018-04-04: 650 mg via ORAL
  Filled 2018-04-04: qty 2

## 2018-04-04 MED ORDER — ONDANSETRON HCL 4 MG PO TABS: 4.0000 mg | ORAL_TABLET | Freq: Four times a day (QID) | ORAL | Status: DC | PRN

## 2018-04-04 MED ORDER — ASPIRIN EC 81 MG PO TBEC
81.0000 mg | DELAYED_RELEASE_TABLET | Freq: Every day | ORAL | Status: DC
  Administered 2018-04-05 – 2018-04-06 (×2): 81 mg via ORAL
  Filled 2018-04-04 (×2): qty 1

## 2018-04-04 MED ORDER — ENSURE ENLIVE PO LIQD
237.0000 mL | Freq: Three times a day (TID) | ORAL | Status: DC
  Administered 2018-04-05 – 2018-04-06 (×5): 237 mL via ORAL

## 2018-04-04 MED ORDER — ATORVASTATIN CALCIUM 20 MG PO TABS
80.0000 mg | ORAL_TABLET | ORAL | Status: DC
  Administered 2018-04-05: 18:00:00 80 mg via ORAL
  Filled 2018-04-04: qty 4

## 2018-04-04 NOTE — ED Notes (Signed)
Pt to CT

## 2018-04-04 NOTE — ED Notes (Signed)
Pt helped up to bedside to void , (pt observed to have an unsteady gate) the patient returned to bed and placed back on monitor .
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
## 2018-04-04 NOTE — ED Notes (Signed)
Pt arrives from home via [facility]. Pt arrives airway patent, respirations even, unlabored, and regular, reson

## 2018-04-04 NOTE — ED Notes (Signed)
John RN, aware of ready bed

## 2018-04-04 NOTE — ED Notes (Signed)
Enter room to find pt shaking, family is concerned about seizure. The shaking does not appear to be seizure like activity. Asked pt to stop shaking and she states she was cold and wanted someone to get her a blanket. Blanket provided.
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
## 2018-04-05 ENCOUNTER — Inpatient Hospital Stay: Payer: Medicare Other

## 2018-04-05 ENCOUNTER — Inpatient Hospital Stay
Admit: 2018-04-05 | Discharge: 2018-04-05 | Disposition: A | Discharge status: Home / Self Care | Payer: Medicare Other | Attending: Internal Medicine | Admitting: Internal Medicine

## 2018-04-05 DIAGNOSIS — G44209 Tension-type headache, unspecified, not intractable: Secondary | ICD-10-CM

## 2018-04-05 LAB — BASIC METABOLIC PANEL
ANION GAP: 4 — AB (ref 5–15)
BUN: 25 mg/dL — ABNORMAL HIGH (ref 8–23)
CHLORIDE: 112 mmol/L — AB (ref 98–111)
CO2: 24 mmol/L (ref 22–32)
Calcium: 8.7 mg/dL — ABNORMAL LOW (ref 8.9–10.3)
Creatinine, Ser: 1.41 mg/dL — ABNORMAL HIGH (ref 0.44–1.00)
GFR calc non Af Amer: 39 mL/min — ABNORMAL LOW (ref >60)
GFR, EST AFRICAN AMERICAN: 45 mL/min — AB (ref >60)
Glucose, Bld: 96 mg/dL (ref 70–99)
POTASSIUM: 4.1 mmol/L (ref 3.5–5.1)
Sodium: 140 mmol/L (ref 135–145)

## 2018-04-05 LAB — CBC
HCT: 32.2 % — ABNORMAL LOW (ref 35.0–47.0)
HEMOGLOBIN: 10.9 g/dL — AB (ref 12.0–16.0)
MCH: 32.9 pg (ref 26.0–34.0)
MCHC: 33.8 g/dL (ref 32.0–36.0)
MCV: 97.5 fL (ref 80.0–100.0)
Platelets: 174 10*3/uL (ref 150–440)
RBC: 3.3 MIL/uL — AB (ref 3.80–5.20)
RDW: 14.4 % (ref 11.5–14.5)
WBC: 5.2 10*3/uL (ref 3.6–11.0)

## 2018-04-05 MED ORDER — FENTANYL CITRATE (PF) 100 MCG/2ML IJ SOLN
50.0000 ug | Freq: Three times a day (TID) | INTRAMUSCULAR | Status: DC | PRN
  Administered 2018-04-05: 05:00:00 50 ug via INTRAVENOUS
  Filled 2018-04-05: qty 2

## 2018-04-05 MED ORDER — METHOCARBAMOL 500 MG PO TABS
500.0000 mg | ORAL_TABLET | Freq: Three times a day (TID) | ORAL | Status: DC
  Administered 2018-04-05 – 2018-04-06 (×3): 500 mg via ORAL
  Filled 2018-04-05 (×5): qty 1

## 2018-04-05 MED ORDER — DEXAMETHASONE SODIUM PHOSPHATE 10 MG/ML IJ SOLN
10.0000 mg | Freq: Once | INTRAMUSCULAR | Status: AC
  Administered 2018-04-05: 16:00:00 10 mg via INTRAVENOUS
  Filled 2018-04-05: qty 1

## 2018-04-05 MED ORDER — KETOROLAC TROMETHAMINE 30 MG/ML IJ SOLN
15.0000 mg | Freq: Once | INTRAMUSCULAR | Status: AC
  Administered 2018-04-05: 15 mg via INTRAVENOUS
  Filled 2018-04-05: qty 1

## 2018-04-05 MED ORDER — MAGNESIUM SULFATE 50 % IJ SOLN
3.0000 g | Freq: Once | INTRAVENOUS | Status: AC
  Administered 2018-04-05: 3 g via INTRAVENOUS
  Filled 2018-04-05: qty 6
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
## 2018-04-05 NOTE — Progress Notes (Signed)
*  PRELIMINARY RESULTS* Echocardiogram 2D Echocardiogram has been performed.  [place] 04/05/2018, 2:37 PM

## 2018-04-06 LAB — BASIC METABOLIC PANEL
Anion gap: 7 (ref 5–15)
BUN: 25 mg/dL — AB (ref 8–23)
CHLORIDE: 108 mmol/L (ref 98–111)
CO2: 23 mmol/L (ref 22–32)
Calcium: 9.5 mg/dL (ref 8.9–10.3)
Creatinine, Ser: 1.1 mg/dL — ABNORMAL HIGH (ref 0.44–1.00)
GFR calc Af Amer: >60 mL/min (ref >60)
GFR calc non Af Amer: 53 mL/min — ABNORMAL LOW (ref >60)
Glucose, Bld: 126 mg/dL — ABNORMAL HIGH (ref 70–99)
POTASSIUM: 4.8 mmol/L (ref 3.5–5.1)
SODIUM: 138 mmol/L (ref 135–145)

## 2018-04-06 LAB — HIV ANTIBODY (ROUTINE TESTING W REFLEX): HIV SCREEN 4TH GENERATION: NONREACTIVE

## 2018-04-06 LAB — GLUCOSE, CAPILLARY: GLUCOSE-CAPILLARY: 113 mg/dL — AB (ref 70–99)

## 2018-04-06 MED ORDER — METHOCARBAMOL 500 MG PO TABS: 500.0000 mg | ORAL_TABLET | Freq: Three times a day (TID) | ORAL | 0 refills | Status: AC
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
## 2018-04-06 NOTE — Progress Notes (Signed)
Pt being discharged home with home health, discharge instructions reviewed with pt and family, states understanding, pt with no complaints

## 2018-04-06 NOTE — Care Management Note (Signed)
Case Management Note  Patient Details  Name: [name] MRN: [id number] Date of Birth: 1955/11/01  Subjective/Objective:  Patient to be discharged per MD order. Orders in place for home health services. Given choice patient prefers [facility] for services. Referral placed with Malachy Mood who accepts the patient for RN and PT services. Patient has a rolling walker in the home and requires no other DME. Family to provide transport home today.  Ines Bloomer RN BSN RNCM [phone]                     Action/Plan:   Expected Discharge Date:  04/06/18               Expected Discharge Plan:  [place]  In-House Referral:     Discharge planning Services  CM Consult  Post Acute Care Choice:  Home Health Choice offered to:  Patient  DME Arranged:    DME Agency:     HH Arranged:  RN, PT HH Agency:  [facility]  Status of Service:  Completed, signed off  If discussed at [place] of Stay Meetings, dates discussed:    Additional Comments:  Latanya Maudlin, RN 04/06/2018, 12:04 PM

## 2018-04-07 ENCOUNTER — Encounter: Payer: Self-pay | Admitting: Adult Health

## 2018-04-07 LAB — ECHOCARDIOGRAM COMPLETE
HEIGHTINCHES: 64 in
Weight: 2176 oz

## 2018-04-08 DIAGNOSIS — I739 Peripheral vascular disease, unspecified: Secondary | ICD-10-CM | POA: Diagnosis not present

## 2018-04-08 DIAGNOSIS — S134XXD Sprain of ligaments of cervical spine, subsequent encounter: Secondary | ICD-10-CM | POA: Diagnosis not present

## 2018-04-08 DIAGNOSIS — I6523 Occlusion and stenosis of bilateral carotid arteries: Secondary | ICD-10-CM | POA: Diagnosis not present

## 2018-04-08 DIAGNOSIS — N179 Acute kidney failure, unspecified: Secondary | ICD-10-CM | POA: Diagnosis not present

## 2018-04-08 DIAGNOSIS — I10 Essential (primary) hypertension: Secondary | ICD-10-CM | POA: Diagnosis not present

## 2018-04-08 DIAGNOSIS — I2510 Atherosclerotic heart disease of native coronary artery without angina pectoris: Secondary | ICD-10-CM | POA: Diagnosis not present

## 2018-04-08 DIAGNOSIS — J449 Chronic obstructive pulmonary disease, unspecified: Secondary | ICD-10-CM | POA: Diagnosis not present

## 2018-04-09 DIAGNOSIS — H5213 Myopia, bilateral: Secondary | ICD-10-CM | POA: Diagnosis not present

## 2018-04-09 DIAGNOSIS — H0289 Other specified disorders of eyelid: Secondary | ICD-10-CM | POA: Diagnosis not present

## 2018-04-10 DIAGNOSIS — N179 Acute kidney failure, unspecified: Secondary | ICD-10-CM | POA: Diagnosis not present

## 2018-04-10 DIAGNOSIS — S134XXD Sprain of ligaments of cervical spine, subsequent encounter: Secondary | ICD-10-CM | POA: Diagnosis not present

## 2018-04-10 DIAGNOSIS — I2510 Atherosclerotic heart disease of native coronary artery without angina pectoris: Secondary | ICD-10-CM | POA: Diagnosis not present

## 2018-04-10 DIAGNOSIS — I6523 Occlusion and stenosis of bilateral carotid arteries: Secondary | ICD-10-CM | POA: Diagnosis not present

## 2018-04-10 DIAGNOSIS — I10 Essential (primary) hypertension: Secondary | ICD-10-CM | POA: Diagnosis not present

## 2018-04-10 DIAGNOSIS — J449 Chronic obstructive pulmonary disease, unspecified: Secondary | ICD-10-CM | POA: Diagnosis not present

## 2018-04-10 DIAGNOSIS — I739 Peripheral vascular disease, unspecified: Secondary | ICD-10-CM | POA: Diagnosis not present

## 2018-04-11 DIAGNOSIS — I6523 Occlusion and stenosis of bilateral carotid arteries: Secondary | ICD-10-CM | POA: Diagnosis not present

## 2018-04-11 DIAGNOSIS — I10 Essential (primary) hypertension: Secondary | ICD-10-CM | POA: Diagnosis not present

## 2018-04-11 DIAGNOSIS — S134XXD Sprain of ligaments of cervical spine, subsequent encounter: Secondary | ICD-10-CM | POA: Diagnosis not present

## 2018-04-11 DIAGNOSIS — J449 Chronic obstructive pulmonary disease, unspecified: Secondary | ICD-10-CM | POA: Diagnosis not present

## 2018-04-11 DIAGNOSIS — I2510 Atherosclerotic heart disease of native coronary artery without angina pectoris: Secondary | ICD-10-CM | POA: Diagnosis not present

## 2018-04-11 DIAGNOSIS — I739 Peripheral vascular disease, unspecified: Secondary | ICD-10-CM | POA: Diagnosis not present

## 2018-04-11 DIAGNOSIS — N179 Acute kidney failure, unspecified: Secondary | ICD-10-CM | POA: Diagnosis not present

## 2018-04-12 DIAGNOSIS — S134XXA Sprain of ligaments of cervical spine, initial encounter: Secondary | ICD-10-CM | POA: Insufficient documentation

## 2018-04-14 DIAGNOSIS — I2510 Atherosclerotic heart disease of native coronary artery without angina pectoris: Secondary | ICD-10-CM | POA: Diagnosis not present

## 2018-04-14 DIAGNOSIS — I480 Paroxysmal atrial fibrillation: Secondary | ICD-10-CM | POA: Diagnosis not present

## 2018-04-14 DIAGNOSIS — Z720 Tobacco use: Secondary | ICD-10-CM | POA: Diagnosis not present

## 2018-04-14 DIAGNOSIS — I10 Essential (primary) hypertension: Secondary | ICD-10-CM | POA: Diagnosis not present

## 2018-04-14 DIAGNOSIS — R55 Syncope and collapse: Secondary | ICD-10-CM | POA: Diagnosis not present

## 2018-04-15 DIAGNOSIS — S134XXD Sprain of ligaments of cervical spine, subsequent encounter: Secondary | ICD-10-CM | POA: Diagnosis not present

## 2018-04-15 DIAGNOSIS — I2510 Atherosclerotic heart disease of native coronary artery without angina pectoris: Secondary | ICD-10-CM | POA: Diagnosis not present

## 2018-04-15 DIAGNOSIS — I10 Essential (primary) hypertension: Secondary | ICD-10-CM | POA: Diagnosis not present

## 2018-04-15 DIAGNOSIS — J449 Chronic obstructive pulmonary disease, unspecified: Secondary | ICD-10-CM | POA: Diagnosis not present

## 2018-04-15 DIAGNOSIS — N179 Acute kidney failure, unspecified: Secondary | ICD-10-CM | POA: Diagnosis not present

## 2018-04-15 DIAGNOSIS — I6523 Occlusion and stenosis of bilateral carotid arteries: Secondary | ICD-10-CM | POA: Diagnosis not present

## 2018-04-15 DIAGNOSIS — I739 Peripheral vascular disease, unspecified: Secondary | ICD-10-CM | POA: Diagnosis not present

## 2018-04-17 DIAGNOSIS — R079 Chest pain, unspecified: Secondary | ICD-10-CM | POA: Diagnosis not present

## 2018-04-22 DIAGNOSIS — I739 Peripheral vascular disease, unspecified: Secondary | ICD-10-CM | POA: Diagnosis not present

## 2018-04-22 DIAGNOSIS — I2510 Atherosclerotic heart disease of native coronary artery without angina pectoris: Secondary | ICD-10-CM | POA: Diagnosis not present

## 2018-04-22 DIAGNOSIS — J449 Chronic obstructive pulmonary disease, unspecified: Secondary | ICD-10-CM | POA: Diagnosis not present

## 2018-04-22 DIAGNOSIS — I10 Essential (primary) hypertension: Secondary | ICD-10-CM | POA: Diagnosis not present

## 2018-04-22 DIAGNOSIS — N179 Acute kidney failure, unspecified: Secondary | ICD-10-CM | POA: Diagnosis not present

## 2018-04-22 DIAGNOSIS — I6523 Occlusion and stenosis of bilateral carotid arteries: Secondary | ICD-10-CM | POA: Diagnosis not present

## 2018-04-22 DIAGNOSIS — S134XXD Sprain of ligaments of cervical spine, subsequent encounter: Secondary | ICD-10-CM | POA: Diagnosis not present

## 2018-04-24 DIAGNOSIS — J449 Chronic obstructive pulmonary disease, unspecified: Secondary | ICD-10-CM | POA: Diagnosis not present

## 2018-04-24 DIAGNOSIS — I6523 Occlusion and stenosis of bilateral carotid arteries: Secondary | ICD-10-CM | POA: Diagnosis not present

## 2018-04-24 DIAGNOSIS — I2510 Atherosclerotic heart disease of native coronary artery without angina pectoris: Secondary | ICD-10-CM | POA: Diagnosis not present

## 2018-04-24 DIAGNOSIS — I10 Essential (primary) hypertension: Secondary | ICD-10-CM | POA: Diagnosis not present

## 2018-04-24 DIAGNOSIS — N179 Acute kidney failure, unspecified: Secondary | ICD-10-CM | POA: Diagnosis not present

## 2018-04-24 DIAGNOSIS — S134XXD Sprain of ligaments of cervical spine, subsequent encounter: Secondary | ICD-10-CM | POA: Diagnosis not present

## 2018-04-24 DIAGNOSIS — I739 Peripheral vascular disease, unspecified: Secondary | ICD-10-CM | POA: Diagnosis not present

## 2018-04-28 DIAGNOSIS — R072 Precordial pain: Secondary | ICD-10-CM | POA: Diagnosis not present

## 2018-04-28 DIAGNOSIS — I2510 Atherosclerotic heart disease of native coronary artery without angina pectoris: Secondary | ICD-10-CM | POA: Diagnosis not present

## 2018-04-28 DIAGNOSIS — I10 Essential (primary) hypertension: Secondary | ICD-10-CM | POA: Diagnosis not present

## 2018-04-28 DIAGNOSIS — R079 Chest pain, unspecified: Secondary | ICD-10-CM | POA: Diagnosis not present

## 2018-04-29 DIAGNOSIS — J449 Chronic obstructive pulmonary disease, unspecified: Secondary | ICD-10-CM | POA: Diagnosis not present

## 2018-04-29 DIAGNOSIS — S134XXD Sprain of ligaments of cervical spine, subsequent encounter: Secondary | ICD-10-CM | POA: Diagnosis not present

## 2018-04-29 DIAGNOSIS — I6523 Occlusion and stenosis of bilateral carotid arteries: Secondary | ICD-10-CM | POA: Diagnosis not present

## 2018-04-29 DIAGNOSIS — I10 Essential (primary) hypertension: Secondary | ICD-10-CM | POA: Diagnosis not present

## 2018-04-29 DIAGNOSIS — I739 Peripheral vascular disease, unspecified: Secondary | ICD-10-CM | POA: Diagnosis not present

## 2018-04-29 DIAGNOSIS — N179 Acute kidney failure, unspecified: Secondary | ICD-10-CM | POA: Diagnosis not present

## 2018-04-29 DIAGNOSIS — I2510 Atherosclerotic heart disease of native coronary artery without angina pectoris: Secondary | ICD-10-CM | POA: Diagnosis not present

## 2018-05-02 DIAGNOSIS — I10 Essential (primary) hypertension: Secondary | ICD-10-CM | POA: Diagnosis not present

## 2018-05-02 DIAGNOSIS — N179 Acute kidney failure, unspecified: Secondary | ICD-10-CM | POA: Diagnosis not present

## 2018-05-02 DIAGNOSIS — I6523 Occlusion and stenosis of bilateral carotid arteries: Secondary | ICD-10-CM | POA: Diagnosis not present

## 2018-05-02 DIAGNOSIS — I2510 Atherosclerotic heart disease of native coronary artery without angina pectoris: Secondary | ICD-10-CM | POA: Diagnosis not present

## 2018-05-02 DIAGNOSIS — J449 Chronic obstructive pulmonary disease, unspecified: Secondary | ICD-10-CM | POA: Diagnosis not present

## 2018-05-02 DIAGNOSIS — S134XXD Sprain of ligaments of cervical spine, subsequent encounter: Secondary | ICD-10-CM | POA: Diagnosis not present

## 2018-05-02 DIAGNOSIS — I739 Peripheral vascular disease, unspecified: Secondary | ICD-10-CM | POA: Diagnosis not present

## 2018-05-07 DIAGNOSIS — N179 Acute kidney failure, unspecified: Secondary | ICD-10-CM | POA: Diagnosis not present

## 2018-05-07 DIAGNOSIS — I340 Nonrheumatic mitral (valve) insufficiency: Secondary | ICD-10-CM | POA: Diagnosis not present

## 2018-05-07 DIAGNOSIS — J449 Chronic obstructive pulmonary disease, unspecified: Secondary | ICD-10-CM | POA: Diagnosis not present

## 2018-05-07 DIAGNOSIS — I2510 Atherosclerotic heart disease of native coronary artery without angina pectoris: Secondary | ICD-10-CM | POA: Diagnosis not present

## 2018-05-07 DIAGNOSIS — I10 Essential (primary) hypertension: Secondary | ICD-10-CM | POA: Diagnosis not present

## 2018-05-07 DIAGNOSIS — I6523 Occlusion and stenosis of bilateral carotid arteries: Secondary | ICD-10-CM | POA: Diagnosis not present

## 2018-05-07 DIAGNOSIS — S134XXD Sprain of ligaments of cervical spine, subsequent encounter: Secondary | ICD-10-CM | POA: Diagnosis not present

## 2018-05-07 DIAGNOSIS — I739 Peripheral vascular disease, unspecified: Secondary | ICD-10-CM | POA: Diagnosis not present

## 2018-05-13 DIAGNOSIS — S134XXD Sprain of ligaments of cervical spine, subsequent encounter: Secondary | ICD-10-CM | POA: Diagnosis not present

## 2018-05-13 DIAGNOSIS — I10 Essential (primary) hypertension: Secondary | ICD-10-CM | POA: Diagnosis not present

## 2018-05-13 DIAGNOSIS — I6523 Occlusion and stenosis of bilateral carotid arteries: Secondary | ICD-10-CM | POA: Diagnosis not present

## 2018-05-13 DIAGNOSIS — I2510 Atherosclerotic heart disease of native coronary artery without angina pectoris: Secondary | ICD-10-CM | POA: Diagnosis not present

## 2018-05-13 DIAGNOSIS — N179 Acute kidney failure, unspecified: Secondary | ICD-10-CM | POA: Diagnosis not present

## 2018-05-13 DIAGNOSIS — I739 Peripheral vascular disease, unspecified: Secondary | ICD-10-CM | POA: Diagnosis not present

## 2018-05-13 DIAGNOSIS — J449 Chronic obstructive pulmonary disease, unspecified: Secondary | ICD-10-CM | POA: Diagnosis not present

## 2018-05-16 ENCOUNTER — Ambulatory Visit (INDEPENDENT_AMBULATORY_CARE_PROVIDER_SITE_OTHER): Payer: Medicare Other | Admitting: Vascular Surgery

## 2018-05-16 ENCOUNTER — Encounter (INDEPENDENT_AMBULATORY_CARE_PROVIDER_SITE_OTHER): Payer: Medicare Other

## 2018-05-16 ENCOUNTER — Encounter (INDEPENDENT_AMBULATORY_CARE_PROVIDER_SITE_OTHER): Payer: Self-pay | Admitting: Vascular Surgery

## 2018-05-16 ENCOUNTER — Ambulatory Visit (INDEPENDENT_AMBULATORY_CARE_PROVIDER_SITE_OTHER): Payer: Medicare Other

## 2018-05-16 VITALS — BP 150/81 | HR 72 | Resp 16 | Ht 64.0 in | Wt 129.4 lb

## 2018-05-16 DIAGNOSIS — F17210 Nicotine dependence, cigarettes, uncomplicated: Secondary | ICD-10-CM

## 2018-05-16 DIAGNOSIS — F17200 Nicotine dependence, unspecified, uncomplicated: Secondary | ICD-10-CM

## 2018-05-16 DIAGNOSIS — I70213 Atherosclerosis of native arteries of extremities with intermittent claudication, bilateral legs: Secondary | ICD-10-CM

## 2018-05-16 DIAGNOSIS — I10 Essential (primary) hypertension: Secondary | ICD-10-CM | POA: Diagnosis not present

## 2018-05-16 NOTE — Assessment & Plan Note (Signed)
Her noninvasive studies showed triphasic waveforms on the right and biphasic waveforms on the left with a right ABI of 1.09 and a left ABI of 1.05.  Her digital pressures are over 100 bilaterally Her claudication symptoms are currently very mild and not lifestyle limiting.  Again advised smoking cessation.  Recheck in 1 year

## 2018-05-16 NOTE — Progress Notes (Signed)
MRN : [id number]  [name] is a 62 y.o. (November 15, 1955) female who presents with chief complaint of  Chief Complaint  Patient presents with  . Follow-up    25yrabi ultrasound  .  History of Present Illness: Patient returns today in follow up of PAD.  She has had multiple treatments for PAD over the years.  She has cramps that wake her at night in the left leg.  No lifestyle limiting claudication, ulceration, or infection.  Her noninvasive studies showed triphasic waveforms on the right and biphasic waveforms on the left with a right ABI of 1.09 and a left ABI of 1.05.  Her digital pressures are over 100 bilaterally.  She does continue to smoke even though she knows this is harmful on the vascular system.  Current Outpatient Medications  Medication Sig Dispense Refill  . albuterol (PROVENTIL HFA;VENTOLIN HFA) 108 (90 Base) MCG/ACT inhaler Inhale 2 puffs into the lungs every 6 (six) hours as needed for wheezing or shortness of breath.    .Marland Kitchenaspirin EC 81 MG EC tablet Take 1 tablet (81 mg total) by mouth daily. 90 tablet 1  . atorvastatin (LIPITOR) 80 MG tablet Take 80 mg by mouth every morning.     . budesonide-formoterol (SYMBICORT) 80-4.5 MCG/ACT inhaler Inhale 2 puffs into the lungs daily.    . cetirizine (ZYRTEC) 10 MG tablet Take 10 mg by mouth daily.    . Cholecalciferol (VITAMIN D3) 2000 units TABS Take 1 tablet by mouth daily.    .Marland KitchenELIQUIS 5 MG TABS tablet Take 5 mg by mouth 2 (two) times daily.  1  . ezetimibe (ZETIA) 10 MG tablet Take 10 mg by mouth daily.  0  . feeding supplement, ENSURE ENLIVE, (ENSURE ENLIVE) LIQD Take 237 mLs by mouth 3 (three) times daily with meals. 237 mL 12  . ferrous sulfate 325 (65 FE) MG tablet Take 325 mg by mouth daily.     No current facility-administered medications for this visit.     Past Medical History:  Diagnosis Date  . Allergy    Environmental  . Arthritis   . Asthma   . Carotid artery stenosis   . Carotid stenosis   . COPD  (chronic obstructive pulmonary disease) ([place]   . Coronary artery disease   . Coronary artery disease   . Hemorrhoids   . Hyperlipidemia   . Hypertension   . 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([facility]    negative cardiac cath  . Nicotine addiction   . Peripheral vascular disease ([place]   . Pneumonia   . Shortness of breath dyspnea   . Tobacco abuse     Past Surgical History:  Procedure Laterality Date  . ABDOMINAL HYSTERECTOMY    . APPENDECTOMY    . CARDIAC CATHETERIZATION    . CARDIAC SURGERY    . CHOLECYSTECTOMY    . COLONOSCOPY  12/15/11   OH->bleeding internal hemorrhoids, otherwise normal  . COLONOSCOPY WITH PROPOFOL N/A 04/12/2015   Procedure: COLONOSCOPY WITH PROPOFOL;  Surgeon: DLucilla Lame MD;  Location: [facility] ENDOSCOPY;  Service: Endoscopy;  Laterality: N/A;  . ENDARTERECTOMY FEMORAL Left 04/26/2015   Procedure: ENDARTERECTOMY FEMORAL;  Surgeon: JAlgernon Huxley MD;  Location: [facility] ORS;  Service: Vascular;  Laterality: Left;  . ENDOVASCULAR STENT INSERTION Bilateral    Legs  . FLEXIBLE BRONCHOSCOPY N/A 02/02/2015   Procedure: FLEXIBLE BRONCHOSCOPY;  Surgeon: SAllyne Gee MD;  Location: [facility] ORS;  Service: Pulmonary;  Laterality: N/A;  . HEMORRHOID SURGERY    .  PERIPHERAL VASCULAR CATHETERIZATION Left 04/25/2015   Procedure: Lower Extremity Angiography;  Surgeon: Algernon Huxley, MD;  Location: [place] [facility];  Service: Cardiovascular;  Laterality: Left;  . PERIPHERAL VASCULAR CATHETERIZATION Left 04/25/2015   Procedure: Lower Extremity Intervention;  Surgeon: Algernon Huxley, MD;  Location: [place] [facility];  Service: Cardiovascular;  Laterality: Left;  . PERIPHERAL VASCULAR CATHETERIZATION Left 01/02/2016   Procedure: Lower Extremity Angiography;  Surgeon: Algernon Huxley, MD;  Location: [place] [facility];  Service: Cardiovascular;  Laterality: Left;  . PERIPHERAL VASCULAR CATHETERIZATION  01/02/2016   Procedure: Lower Extremity Intervention;  Surgeon: Algernon Huxley, MD;  Location: [place] [facility];  Service: Cardiovascular;;  . THROMBECTOMY FEMORAL ARTERY Left 04/26/2015   Procedure: THROMBECTOMY FEMORAL ARTERY;  Surgeon: Algernon Huxley, MD;  Location: [facility] ORS;  Service: Vascular;  Laterality: Left;    Social History  Substance Use Topics  . Smoking status: Current Every Day Smoker    Packs/day: 1.00    Years: 41.00    Types: Cigarettes  . Smokeless tobacco: Never Used     Comment: pt recommend to stop smoking 4 min spent will start on prn nicotine replacment  . Alcohol use 0.0 oz/week      Comment: weekends, couple beers, pint of liquer only on weekends     Family History      Family History  Problem Relation Age of Onset  . Hypertension Mother   . Hypertension Father   . Breast cancer Neg Hx          Allergies  Allergen Reactions  . Aspirin Nausea And Vomiting and Other (See Comments)    Pt states aspirin makes her cramp and have to use coated kind.  Alethia Berthold [Cetirizine]      REVIEW OF SYSTEMS (Negative unless checked)  Constitutional: []Weight loss  []Fever  []Chills Cardiac: []Chest pain   []Chest pressure   []Palpitations   []Shortness of breath when laying flat   []Shortness of breath at rest   [x]Shortness of breath with exertion. Vascular:  [x]Pain in legs with walking   []Pain in legs at rest   []Pain in legs when laying flat   [x]Claudication   []Pain in feet when walking  []Pain in feet at rest  []Pain in feet when laying flat   []History of DVT   []Phlebitis   [x]Swelling in legs   []Varicose veins   []Non-healing ulcers Pulmonary:   []Uses home oxygen   []Productive cough   []Hemoptysis   []Wheeze  []COPD   []Asthma Neurologic:  []Dizziness  []Blackouts   []Seizures   []History of stroke   []History of TIA  []Aphasia   []Temporary blindness   []Dysphagia   []Weakness or numbness in arms   []Weakness or numbness in legs Musculoskeletal:  []Arthritis   []Joint swelling   []Joint pain   []Low back pain Hematologic:  []Easy  bruising  []Easy bleeding   []Hypercoagulable state   []Anemic   Gastrointestinal:  []Blood in stool   []Vomiting blood  []Gastroesophageal reflux/heartburn   []Abdominal pain Genitourinary:  []Chronic kidney disease   []Difficult urination  []Frequent urination  []Burning with urination   []Hematuria Skin:  []Rashes   []Ulcers   []Wounds Psychological:  []History of anxiety   [] History of major depression.    Physical Examination  BP (!) 150/81 (BP Location: Right Arm)   Pulse 72   Resp 16   Ht 5' 4" (1.626 m)  Wt 129 lb 6.4 oz (58.7 kg)   LMP  (LMP Unknown)   BMI 22.21 kg/m  Gen:  WD/WN, NAD Head: [place]/AT, No temporalis wasting. Ear/Nose/Throat: Hearing grossly intact, nares w/o erythema or drainage Eyes: Conjunctiva clear. Sclera non-icteric Neck: Supple.  Trachea midline Pulmonary:  Good air movement, no use of accessory muscles.  Cardiac: RRR, no JVD Vascular:  Vessel Right Left  Radial Palpable Palpable                          PT  1+ palpable  1+ palpable  DP Palpable  1+ palpable    Musculoskeletal: M/S 5/5 throughout.  No deformity or atrophy.  Trace left lower extremity edema. Neurologic: Sensation grossly intact in extremities.  Symmetrical.  Speech is fluent.  Psychiatric: Judgment intact, Mood & affect appropriate for pt's clinical situation. Dermatologic: No rashes or ulcers noted.  No cellulitis or open wounds.       Labs Recent Results (from the past 2160 hour(s))  CBC with Differential     Status: None   Collection Time: 04/04/18  5:47 PM  Result Value Ref Range   WBC 6.7 3.6 - 11.0 K/uL   RBC 3.97 3.80 - 5.20 MIL/uL   Hemoglobin 13.0 12.0 - 16.0 g/dL   HCT 39.0 35.0 - 47.0 %   MCV 98.2 80.0 - 100.0 fL   MCH 32.8 26.0 - 34.0 pg   MCHC 33.4 32.0 - 36.0 g/dL   RDW 14.3 11.5 - 14.5 %   Platelets 203 150 - 440 K/uL   Neutrophils Relative % 65 %   Neutro Abs 4.4 1.4 - 6.5 K/uL   Lymphocytes Relative 26 %   Lymphs Abs 1.8 1.0 - 3.6 K/uL    Monocytes Relative 7 %   Monocytes Absolute 0.5 0.2 - 0.9 K/uL   Eosinophils Relative 1 %   Eosinophils Absolute 0.1 0 - 0.7 K/uL   Basophils Relative 1 %   Basophils Absolute 0.1 0 - 0.1 K/uL    Comment: Performed at [facility], [address] [place], [place] [address]  Comprehensive metabolic panel     Status: Abnormal   Collection Time: 04/04/18  5:47 PM  Result Value Ref Range   Sodium 137 135 - 145 mmol/L   Potassium 4.0 3.5 - 5.1 mmol/L   Chloride 106 98 - 111 mmol/L   CO2 20 (L) 22 - 32 mmol/L   Glucose, Bld 91 70 - 99 mg/dL   BUN 29 (H) 8 - 23 mg/dL   Creatinine, Ser 2.13 (H) 0.44 - 1.00 mg/dL   Calcium 9.3 8.9 - 10.3 mg/dL   Total Protein 7.7 6.5 - 8.1 g/dL   Albumin 4.5 3.5 - 5.0 g/dL   AST 19 15 - 41 U/L   ALT 17 0 - 44 U/L   Alkaline Phosphatase 63 38 - 126 U/L   Total Bilirubin 0.5 0.3 - 1.2 mg/dL   GFR calc non Af Amer 24 (L) >60 mL/min   GFR calc Af Amer 27 (L) >60 mL/min    Comment: (NOTE) The eGFR has been calculated using the CKD EPI equation. This calculation has not been validated in all clinical situations. eGFR's persistently <60 mL/min signify possible Chronic Kidney Disease.    Anion gap 11 5 - 15    Comment: Performed at [facility], [address] [place], [place] [address]  Troponin I     Status: None   Collection  Time: 04/04/18  5:47 PM  Result Value Ref Range   Troponin I <0.03 <0.03 ng/mL    Comment: Performed at [facility], [address] [place], [place] [address]  HIV antibody (Routine Testing)     Status: None   Collection Time: 04/05/18  5:04 AM  Result Value Ref Range   HIV Screen 4th Generation wRfx Non Reactive Non Reactive    Comment: (NOTE) Performed At: [facility] [place], [place] [id number] Rush Farmer MD [id number]   Basic metabolic panel     Status: Abnormal   Collection Time: 04/05/18  5:04 AM  Result Value Ref Range   Sodium 140 135 - 145 mmol/L    Potassium 4.1 3.5 - 5.1 mmol/L   Chloride 112 (H) 98 - 111 mmol/L   CO2 24 22 - 32 mmol/L   Glucose, Bld 96 70 - 99 mg/dL   BUN 25 (H) 8 - 23 mg/dL   Creatinine, Ser 1.41 (H) 0.44 - 1.00 mg/dL   Calcium 8.7 (L) 8.9 - 10.3 mg/dL   GFR calc non Af Amer 39 (L) >60 mL/min   GFR calc Af Amer 45 (L) >60 mL/min    Comment: (NOTE) The eGFR has been calculated using the CKD EPI equation. This calculation has not been validated in all clinical situations. eGFR's persistently <60 mL/min signify possible Chronic Kidney Disease.    Anion gap 4 (L) 5 - 15    Comment: Performed at [facility], [address] [place], [place] [address]  CBC     Status: Abnormal   Collection Time: 04/05/18  5:04 AM  Result Value Ref Range   WBC 5.2 3.6 - 11.0 K/uL   RBC 3.30 (L) 3.80 - 5.20 MIL/uL   Hemoglobin 10.9 (L) 12.0 - 16.0 g/dL   HCT 32.2 (L) 35.0 - 47.0 %   MCV 97.5 80.0 - 100.0 fL   MCH 32.9 26.0 - 34.0 pg   MCHC 33.8 32.0 - 36.0 g/dL   RDW 14.4 11.5 - 14.5 %   Platelets 174 150 - 440 K/uL    Comment: Performed at [facility], [address] [place], [place] [address]  ECHOCARDIOGRAM COMPLETE     Status: None   Collection Time: 04/05/18  2:36 PM  Result Value Ref Range   Weight 2,176 oz   Height 64 in   BP 88/69 mmHg  Basic metabolic panel     Status: Abnormal   Collection Time: 04/06/18  4:41 AM  Result Value Ref Range   Sodium 138 135 - 145 mmol/L   Potassium 4.8 3.5 - 5.1 mmol/L   Chloride 108 98 - 111 mmol/L   CO2 23 22 - 32 mmol/L   Glucose, Bld 126 (H) 70 - 99 mg/dL   BUN 25 (H) 8 - 23 mg/dL   Creatinine, Ser 1.10 (H) 0.44 - 1.00 mg/dL   Calcium 9.5 8.9 - 10.3 mg/dL   GFR calc non Af Amer 53 (L) >60 mL/min   GFR calc Af Amer >60 >60 mL/min    Comment: (NOTE) The eGFR has been calculated using the CKD EPI equation. This calculation has not been validated in all clinical situations. eGFR's persistently <60 mL/min signify possible Chronic Kidney Disease.     Anion gap 7 5 - 15    Comment: Performed at [facility], [place], [place] [address]  Glucose, capillary     Status: Abnormal   Collection Time: 04/06/18  8:02 AM  Result Value Ref Range   Glucose-Capillary 113 (H) 70 - 99 mg/dL    Radiology No results found.  Assessment/Plan Hypertension blood pressure control important in reducing the progression of atherosclerotic disease. On appropriate oral medications.   Tobacco use disorder We had a discussion for approximately 3 minutes regarding the absolute need for smoking cessation due to the deleterious nature of tobacco on the vascular system. We discussed the tobacco use would diminish patency of any intervention, and likely significantly worsen progressio of disease. We discussed multiple agents for quitting including replacement therapy or medications to reduce cravings such as Chantix. The patient voices their understanding of the importance of smoking cessation.  Atherosclerosis of native arteries of extremity with intermittent claudication (HCC) Her noninvasive studies showed triphasic waveforms on the right and biphasic waveforms on the left with a right ABI of 1.09 and a left ABI of 1.05.  Her digital pressures are over 100 bilaterally Her claudication symptoms are currently very mild and not lifestyle limiting.  Again advised smoking cessation.  Recheck in 1 year    Leotis Pain, MD  05/16/2018 12:12 PM    This note was created with Dragon medical transcription system.  Any errors from dictation are purely unintentional

## 2018-05-16 NOTE — Patient Instructions (Signed)

## 2018-05-22 DIAGNOSIS — S134XXD Sprain of ligaments of cervical spine, subsequent encounter: Secondary | ICD-10-CM | POA: Diagnosis not present

## 2018-05-22 DIAGNOSIS — I2510 Atherosclerotic heart disease of native coronary artery without angina pectoris: Secondary | ICD-10-CM | POA: Diagnosis not present

## 2018-05-22 DIAGNOSIS — I739 Peripheral vascular disease, unspecified: Secondary | ICD-10-CM | POA: Diagnosis not present

## 2018-05-22 DIAGNOSIS — N179 Acute kidney failure, unspecified: Secondary | ICD-10-CM | POA: Diagnosis not present

## 2018-05-22 DIAGNOSIS — J449 Chronic obstructive pulmonary disease, unspecified: Secondary | ICD-10-CM | POA: Diagnosis not present

## 2018-05-22 DIAGNOSIS — I6523 Occlusion and stenosis of bilateral carotid arteries: Secondary | ICD-10-CM | POA: Diagnosis not present

## 2018-05-22 DIAGNOSIS — I10 Essential (primary) hypertension: Secondary | ICD-10-CM | POA: Diagnosis not present

## 2018-05-27 ENCOUNTER — Telehealth: Payer: Self-pay | Admitting: Internal Medicine

## 2018-05-27 DIAGNOSIS — N179 Acute kidney failure, unspecified: Secondary | ICD-10-CM | POA: Diagnosis not present

## 2018-05-27 DIAGNOSIS — S134XXD Sprain of ligaments of cervical spine, subsequent encounter: Secondary | ICD-10-CM | POA: Diagnosis not present

## 2018-05-27 DIAGNOSIS — I6523 Occlusion and stenosis of bilateral carotid arteries: Secondary | ICD-10-CM | POA: Diagnosis not present

## 2018-05-27 DIAGNOSIS — I2510 Atherosclerotic heart disease of native coronary artery without angina pectoris: Secondary | ICD-10-CM | POA: Diagnosis not present

## 2018-05-27 DIAGNOSIS — J449 Chronic obstructive pulmonary disease, unspecified: Secondary | ICD-10-CM | POA: Diagnosis not present

## 2018-05-27 DIAGNOSIS — I739 Peripheral vascular disease, unspecified: Secondary | ICD-10-CM | POA: Diagnosis not present

## 2018-05-27 DIAGNOSIS — I10 Essential (primary) hypertension: Secondary | ICD-10-CM | POA: Diagnosis not present

## 2018-05-27 NOTE — Telephone Encounter (Signed)
Nurse call from [facility] home health , that ms  [name] was discharged from home heath, all goals have been met, recent visit with cardiologist and all is well , can call back if any questions. [phone]

## 2018-06-25 ENCOUNTER — Other Ambulatory Visit: Payer: Self-pay | Admitting: Internal Medicine

## 2018-06-25 DIAGNOSIS — Z1231 Encounter for screening mammogram for malignant neoplasm of breast: Secondary | ICD-10-CM

## 2018-06-26 ENCOUNTER — Ambulatory Visit
Admission: RE | Admit: 2018-06-26 | Discharge: 2018-06-26 | Disposition: A | Discharge status: Home / Self Care | Payer: Medicare Other | Source: Ambulatory Visit | Attending: Internal Medicine | Admitting: Internal Medicine

## 2018-06-26 DIAGNOSIS — Z1231 Encounter for screening mammogram for malignant neoplasm of breast: Secondary | ICD-10-CM | POA: Insufficient documentation

## 2018-06-27 ENCOUNTER — Other Ambulatory Visit
Admission: RE | Admit: 2018-06-27 | Discharge: 2018-06-27 | Disposition: A | Discharge status: Home / Self Care | Payer: Medicare Other | Source: Ambulatory Visit | Attending: Nurse Practitioner | Admitting: Nurse Practitioner

## 2018-06-27 ENCOUNTER — Ambulatory Visit (INDEPENDENT_AMBULATORY_CARE_PROVIDER_SITE_OTHER): Payer: Medicare Other | Admitting: Nurse Practitioner

## 2018-06-27 ENCOUNTER — Encounter: Payer: Self-pay | Admitting: Nurse Practitioner

## 2018-06-27 VITALS — BP 146/80 | HR 80 | Resp 16 | Ht 64.0 in | Wt 132.4 lb

## 2018-06-27 DIAGNOSIS — F17219 Nicotine dependence, cigarettes, with unspecified nicotine-induced disorders: Secondary | ICD-10-CM | POA: Diagnosis not present

## 2018-06-27 DIAGNOSIS — E782 Mixed hyperlipidemia: Secondary | ICD-10-CM

## 2018-06-27 DIAGNOSIS — I10 Essential (primary) hypertension: Secondary | ICD-10-CM

## 2018-06-27 DIAGNOSIS — R300 Dysuria: Secondary | ICD-10-CM | POA: Diagnosis not present

## 2018-06-27 DIAGNOSIS — J449 Chronic obstructive pulmonary disease, unspecified: Secondary | ICD-10-CM | POA: Diagnosis not present

## 2018-06-27 DIAGNOSIS — Z0000 Encounter for general adult medical examination without abnormal findings: Secondary | ICD-10-CM | POA: Diagnosis not present

## 2018-06-27 DIAGNOSIS — E559 Vitamin D deficiency, unspecified: Secondary | ICD-10-CM | POA: Insufficient documentation

## 2018-06-27 LAB — LIPID PANEL
CHOLESTEROL: 119 mg/dL (ref 0–200)
HDL: 49 mg/dL (ref >40)
LDL CALC: 59 mg/dL (ref 0–99)
TRIGLYCERIDES: 54 mg/dL (ref <150)
Total CHOL/HDL Ratio: 2.4 RATIO
VLDL: 11 mg/dL (ref 0–40)

## 2018-06-27 LAB — COMPREHENSIVE METABOLIC PANEL
ALBUMIN: 4.3 g/dL (ref 3.5–5.0)
ALK PHOS: 60 U/L (ref 38–126)
ALT: 20 U/L (ref 0–44)
AST: 22 U/L (ref 15–41)
Anion gap: 9 (ref 5–15)
BILIRUBIN TOTAL: 0.7 mg/dL (ref 0.3–1.2)
BUN: 15 mg/dL (ref 8–23)
CALCIUM: 9.5 mg/dL (ref 8.9–10.3)
CHLORIDE: 106 mmol/L (ref 98–111)
CO2: 23 mmol/L (ref 22–32)
Creatinine, Ser: 0.85 mg/dL (ref 0.44–1.00)
GFR calc Af Amer: >60 mL/min (ref >60)
Glucose, Bld: 88 mg/dL (ref 70–99)
Potassium: 4.1 mmol/L (ref 3.5–5.1)
Sodium: 138 mmol/L (ref 135–145)
Total Protein: 7.3 g/dL (ref 6.5–8.1)

## 2018-06-27 LAB — CBC
HEMATOCRIT: 31.1 % — AB (ref 36.0–46.0)
HEMOGLOBIN: 9.3 g/dL — AB (ref 12.0–15.0)
MCH: 31.2 pg (ref 26.0–34.0)
MCHC: 29.9 g/dL — ABNORMAL LOW (ref 30.0–36.0)
MCV: 104.4 fL — AB (ref 80.0–100.0)
NRBC: 0 % (ref 0.0–0.2)
Platelets: 257 10*3/uL (ref 150–400)
RBC: 2.98 MIL/uL — AB (ref 3.87–5.11)
RDW: 15 % (ref 11.5–15.5)
WBC: 4.6 10*3/uL (ref 4.0–10.5)

## 2018-06-27 LAB — TSH: TSH: 1.004 u[IU]/mL (ref 0.350–4.500)

## 2018-06-27 LAB — T4, FREE: Free T4: 0.91 ng/dL (ref 0.82–1.77)

## 2018-06-27 MED ORDER — ALBUTEROL SULFATE HFA 108 (90 BASE) MCG/ACT IN AERS: 2.0000 | INHALATION_SPRAY | Freq: Four times a day (QID) | RESPIRATORY_TRACT | 5 refills | Status: DC | PRN

## 2018-06-27 MED ORDER — ATORVASTATIN CALCIUM 80 MG PO TABS: 80.0000 mg | ORAL_TABLET | ORAL | 5 refills | Status: DC

## 2018-06-27 MED ORDER — BUDESONIDE-FORMOTEROL FUMARATE 80-4.5 MCG/ACT IN AERO: 2.0000 | INHALATION_SPRAY | Freq: Every day | RESPIRATORY_TRACT | 5 refills | Status: DC

## 2018-06-27 NOTE — Progress Notes (Signed)
[facility] [place], [place] [address]  Internal MEDICINE  Office Visit Note  Patient Name: [name]  [id number]  [id number]  Date of Service: 06/29/2018   Pt is here for routine health maintenance examination  Chief Complaint  Patient presents with  . Medicare Wellness    well visit  . Hyperlipidemia  . Hypertension  . COPD  . Quality Metric Gaps    colonoscopy done 04/12/15, ptjust had a mammogram done yesterday 06/26/18     Patient arrived to primary care for her annual wellness visit. Patient reports she is doing well overall. She has ocasional dry cough with recent weather change. Patient has a good control of her COPD condition.  she used her prn albuterol inhaler 2 weeks ago. Patient reported occasional cramping feeling in the chest that lasts couple minutes. It occurs with body movement and resolves on its own shortly after. Otherwise, patient denies any recent fever, chest pain, shortness of breath, wheezing or change in daily activities. Patient does not want to do breast exam visit due to negative mammogram results form 06/26/18. Results were reviewed with patient in the office. Patient had a recent admission to the hospital due to syncope episode and hypotension. Her blood pressure medications were stopped on the discharge. Patient was checking her blood pressure for the past month every other day and it stayed consistently 140s/80s. Patient does not want to restart her blood pressure medication at this moment. Patient is educated to return to the office if blood pressure goes up.    Current Medication: Outpatient Encounter Medications as of 06/27/2018  Medication Sig  . albuterol (PROVENTIL HFA;VENTOLIN HFA) 108 (90 Base) MCG/ACT inhaler Inhale 2 puffs into the lungs every 6 (six) hours as needed for wheezing or shortness of breath.  Marland Kitchen aspirin EC 81 MG EC tablet Take 1 tablet (81 mg total) by mouth daily.  Marland Kitchen atorvastatin (LIPITOR) 80 MG  tablet Take 1 tablet (80 mg total) by mouth every morning.  . budesonide-formoterol (SYMBICORT) 80-4.5 MCG/ACT inhaler Inhale 2 puffs into the lungs daily.  . cetirizine (ZYRTEC) 10 MG tablet Take 10 mg by mouth daily.  . cholecalciferol (VITAMIN D) 400 units TABS tablet Take 400 Units by mouth.  Arne Cleveland 5 MG TABS tablet Take 5 mg by mouth 2 (two) times daily.  Marland Kitchen ezetimibe (ZETIA) 10 MG tablet Take 10 mg by mouth daily.  . ferrous sulfate 325 (65 FE) MG tablet Take 325 mg by mouth daily.  . [DISCONTINUED] albuterol (PROVENTIL HFA;VENTOLIN HFA) 108 (90 Base) MCG/ACT inhaler Inhale 2 puffs into the lungs every 6 (six) hours as needed for wheezing or shortness of breath.  . [DISCONTINUED] atorvastatin (LIPITOR) 80 MG tablet Take 80 mg by mouth every morning.   . [DISCONTINUED] budesonide-formoterol (SYMBICORT) 80-4.5 MCG/ACT inhaler Inhale 2 puffs into the lungs daily.  . [DISCONTINUED] Cholecalciferol (VITAMIN D3) 2000 units TABS Take 1 tablet by mouth daily.  . [DISCONTINUED] feeding supplement, ENSURE ENLIVE, (ENSURE ENLIVE) LIQD Take 237 mLs by mouth 3 (three) times daily with meals. (Patient not taking: Reported on 06/27/2018)   No facility-administered encounter medications on file as of 06/27/2018.     Surgical History: Past Surgical History:  Procedure Laterality Date  . ABDOMINAL HYSTERECTOMY    . APPENDECTOMY    . CARDIAC CATHETERIZATION    . CARDIAC SURGERY    . CHOLECYSTECTOMY    . COLONOSCOPY  12/15/11   OH->bleeding internal hemorrhoids, otherwise normal  . COLONOSCOPY WITH PROPOFOL  N/A 04/12/2015   Procedure: COLONOSCOPY WITH PROPOFOL;  Surgeon: Lucilla Lame, MD;  Location: [facility] ENDOSCOPY;  Service: Endoscopy;  Laterality: N/A;  . ENDARTERECTOMY FEMORAL Left 04/26/2015   Procedure: ENDARTERECTOMY FEMORAL;  Surgeon: Algernon Huxley, MD;  Location: [facility] ORS;  Service: Vascular;  Laterality: Left;  . ENDOVASCULAR STENT INSERTION Bilateral    Legs  . FLEXIBLE BRONCHOSCOPY N/A  02/02/2015   Procedure: FLEXIBLE BRONCHOSCOPY;  Surgeon: Allyne Gee, MD;  Location: [facility] ORS;  Service: Pulmonary;  Laterality: N/A;  . HEMORRHOID SURGERY    . PERIPHERAL VASCULAR CATHETERIZATION Left 04/25/2015   Procedure: Lower Extremity Angiography;  Surgeon: Algernon Huxley, MD;  Location: [place] [facility];  Service: Cardiovascular;  Laterality: Left;  . PERIPHERAL VASCULAR CATHETERIZATION Left 04/25/2015   Procedure: Lower Extremity Intervention;  Surgeon: Algernon Huxley, MD;  Location: [place] [facility];  Service: Cardiovascular;  Laterality: Left;  . PERIPHERAL VASCULAR CATHETERIZATION Left 01/02/2016   Procedure: Lower Extremity Angiography;  Surgeon: Algernon Huxley, MD;  Location: [place] [facility];  Service: Cardiovascular;  Laterality: Left;  . PERIPHERAL VASCULAR CATHETERIZATION  01/02/2016   Procedure: Lower Extremity Intervention;  Surgeon: Algernon Huxley, MD;  Location: [place] [facility];  Service: Cardiovascular;;  . THROMBECTOMY FEMORAL ARTERY Left 04/26/2015   Procedure: THROMBECTOMY FEMORAL ARTERY;  Surgeon: Algernon Huxley, MD;  Location: [facility] OR[facility];  Service: Vascular;  Laterality: Left;    Medical History: Past Medical History:  Diagnosis Date  . Allergy    Environmental  . Arthritis   . Asthma   . Carotid artery stenosis   . Carotid stenosis   . COPD (chronic obstructive pulmonary disease) ([place])   . Coronary artery disease   . Coronary artery disease   . Hemorrhoids   . Hyperlipidemia   . Hypertension   . 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[facility])    negative cardiac cath  . Nicotine addiction   . Peripheral vascular disease ([place])   . Pneumonia   . Shortness of breath dyspnea   . Tobacco abuse     Family History: Family History  Problem Relation Age of Onset  . Hypertension Mother   . Hypertension Father       Review of Systems  Constitutional: Negative for activity change, appetite change, chills, fatigue, fever and unexpected weight change.       Patient eats  sporadically,but that is her baseline, no weight loss  HENT: Negative for congestion, ear discharge, ear pain, facial swelling, hearing loss, postnasal drip, rhinorrhea, sinus pressure, sinus pain, sore throat and trouble swallowing.        Occasional rhinorrhea, but not now  Eyes: Negative for photophobia, pain, discharge, redness and visual disturbance.       Eye exam done several months ago  Respiratory: Positive for chest tightness. Negative for apnea, shortness of breath and wheezing.        Occasional "chest cramping", but currently negative for it. Starts with twisting body movements. Resolves on it's own.   Cardiovascular: Negative for chest pain, palpitations and leg swelling.       Blood pressure is weill managed without blood pressure medication at this time.   Gastrointestinal: Negative for abdominal pain, anal bleeding, blood in stool, constipation, diarrhea, nausea and vomiting.  Endocrine: Negative for cold intolerance, heat intolerance, polydipsia, polyphagia and polyuria.  Genitourinary: Negative for difficulty urinating, dysuria, flank pain, frequency, hematuria, pelvic pain and urgency.  Musculoskeletal: Positive for back pain. Negative for gait problem, joint swelling and  neck pain.       Chronic right side musculoskeletal pain  Skin: Negative for color change, pallor, rash and wound.  Allergic/Immunologic: Positive for environmental allergies.  Neurological: Negative for dizziness, tremors, seizures, syncope, facial asymmetry, speech difficulty, weakness, light-headedness, numbness and headaches.  Psychiatric/Behavioral: Positive for dysphoric mood. Negative for agitation, behavioral problems, confusion and sleep disturbance. The patient is not nervous/anxious.      Vital Signs: BP (!) 146/80 (BP Location: Right Arm, Patient Position: Sitting, Cuff Size: Normal)   Pulse 80   Resp 16   Ht 5' 4"  (1.626 m)   Wt 132 lb 6.4 oz (60.1 kg)   LMP  (LMP Unknown)   SpO2 98%    BMI 22.73 kg/m    Physical Exam  Constitutional: She is oriented to person, place, and time. She appears well-developed and well-nourished.  HENT:  Head: Normocephalic and atraumatic.  Right Ear: External ear normal.  Left Ear: External ear normal.  Nose: Nose normal.  Mouth/Throat: Oropharynx is clear and moist.  Eyes: Pupils are equal, round, and reactive to light. Conjunctivae and EOM are normal. Right eye exhibits no discharge. Left eye exhibits no discharge. No scleral icterus.  Neck: Normal range of motion. Neck supple. Carotid bruit is not present. No tracheal deviation present. No thyromegaly present.  Cardiovascular: Normal rate, regular rhythm, normal heart sounds and intact distal pulses. Exam reveals no gallop and no friction rub.  No murmur heard. Pulmonary/Chest: Effort normal and breath sounds normal. No stridor. No respiratory distress. She has no wheezes. She has no rales. She exhibits no tenderness.  Abdominal: Soft. Bowel sounds are normal. She exhibits no distension and no mass. There is no tenderness. There is no rebound.  Musculoskeletal: She exhibits no edema, tenderness or deformity.  Lymphadenopathy:    She has no cervical adenopathy.  Neurological: She is alert and oriented to person, place, and time. Coordination normal.  Skin: Skin is warm and dry.  Psychiatric: Her speech is normal and behavior is normal. Judgment and thought content normal. Cognition and memory are normal. She exhibits a depressed mood.  Nursing note and vitals reviewed.   Depression screen [facility] 2/9 06/27/2018 04/01/2018 03/20/2018 12/23/2017 11/11/2017  Decreased Interest 3 0 2 0 3  Down, Depressed, Hopeless 3 0 2 0 3  PHQ - 2 Score 6 0 4 0 6  Altered sleeping 3 0 2 - 0  Tired, decreased energy 0 0 3 - 3  Change in appetite 3 0 3 - 3  Feeling bad or failure about yourself  1 0 0 - 3  Trouble concentrating 0 0 3 - 3  Moving slowly or fidgety/restless 0 0 0 - 0  Suicidal thoughts 0 0 0 - 0   PHQ-9 Score 13 0 15 - 18  Difficult doing work/chores - - - - Very difficult    Functional Status Survey: Is the patient deaf or have difficulty hearing?: Yes Does the patient have difficulty seeing, even when wearing glasses/contacts?: No(pt wears glasses and can see good with glasses) Does the patient have difficulty concentrating, remembering, or making decisions?: Yes Does the patient have difficulty walking or climbing stairs?: No Does the patient have difficulty dressing or bathing?: No Does the patient have difficulty doing errands alone such as visiting a doctor's office or shopping?: No  MMSE - Mini Mental State Exam 06/27/2018  Orientation to time 5  Orientation to Place 5  Registration 3  Attention/ Calculation 5  Recall 3  Language- name  2 objects 2  Language- repeat 1  Language- follow 3 step command 3  Language- read & follow direction 1  Write a sentence 1  Copy design 1  Total score 30    Fall Risk  06/27/2018 04/01/2018 03/20/2018 12/23/2017 11/11/2017  Falls in the past year? No No No Yes Yes  Number falls in past yr: - - - 1 1  Injury with Fall? - - - - (No Data)  Comment - - - - tooth loose     LABS: Recent Results (from the past 2160 hour(s))  CBC with Differential     Status: None   Collection Time: 04/04/18  5:47 PM  Result Value Ref Range   WBC 6.7 3.6 - 11.0 K/uL   RBC 3.97 3.80 - 5.20 MIL/uL   Hemoglobin 13.0 12.0 - 16.0 g/dL   HCT 39.0 35.0 - 47.0 %   MCV 98.2 80.0 - 100.0 fL   MCH 32.8 26.0 - 34.0 pg   MCHC 33.4 32.0 - 36.0 g/dL   RDW 14.3 11.5 - 14.5 %   Platelets 203 150 - 440 K/uL   Neutrophils Relative % 65 %   Neutro Abs 4.4 1.4 - 6.5 K/uL   Lymphocytes Relative 26 %   Lymphs Abs 1.8 1.0 - 3.6 K/uL   Monocytes Relative 7 %   Monocytes Absolute 0.5 0.2 - 0.9 K/uL   Eosinophils Relative 1 %   Eosinophils Absolute 0.1 0 - 0.7 K/uL   Basophils Relative 1 %   Basophils Absolute 0.1 0 - 0.1 K/uL    Comment: Performed at [facility], [address] [place], [place] [address]  Comprehensive metabolic panel     Status: Abnormal   Collection Time: 04/04/18  5:47 PM  Result Value Ref Range   Sodium 137 135 - 145 mmol/L   Potassium 4.0 3.5 - 5.1 mmol/L   Chloride 106 98 - 111 mmol/L   CO2 20 (L) 22 - 32 mmol/L   Glucose, Bld 91 70 - 99 mg/dL   BUN 29 (H) 8 - 23 mg/dL   Creatinine, Ser 2.13 (H) 0.44 - 1.00 mg/dL   Calcium 9.3 8.9 - 10.3 mg/dL   Total Protein 7.7 6.5 - 8.1 g/dL   Albumin 4.5 3.5 - 5.0 g/dL   AST 19 15 - 41 U/L   ALT 17 0 - 44 U/L   Alkaline Phosphatase 63 38 - 126 U/L   Total Bilirubin 0.5 0.3 - 1.2 mg/dL   GFR calc non Af Amer 24 (L) >60 mL/min   GFR calc Af Amer 27 (L) >60 mL/min    Comment: (NOTE) The eGFR has been calculated using the CKD EPI equation. This calculation has not been validated in all clinical situations. eGFR's persistently <60 mL/min signify possible Chronic Kidney Disease.    Anion gap 11 5 - 15    Comment: Performed at [facility], [address] [place], [place] [address]  Troponin I     Status: None   Collection Time: 04/04/18  5:47 PM  Result Value Ref Range   Troponin I <0.03 <0.03 ng/mL    Comment: Performed at [facility], [address] [place], [place] [address]  HIV antibody (Routine Testing)     Status: None   Collection Time: 04/05/18  5:04 AM  Result Value Ref Range   HIV Screen 4th Generation wRfx Non Reactive Non Reactive    Comment: (NOTE) Performed At: [facility] [address]  [place], [place] [id number] Rush Farmer MD RF:[id number]   Basic metabolic panel     Status: Abnormal   Collection Time: 04/05/18  5:04 AM  Result Value Ref Range   Sodium 140 135 - 145 mmol/L   Potassium 4.1 3.5 - 5.1 mmol/L   Chloride 112 (H) 98 - 111 mmol/L   CO2 24 22 - 32 mmol/L   Glucose, Bld 96 70 - 99 mg/dL   BUN 25 (H) 8 - 23 mg/dL   Creatinine, Ser 1.41 (H) 0.44 - 1.00 mg/dL   Calcium 8.7 (L) 8.9 - 10.3 mg/dL    GFR calc non Af Amer 39 (L) >60 mL/min   GFR calc Af Amer 45 (L) >60 mL/min    Comment: (NOTE) The eGFR has been calculated using the CKD EPI equation. This calculation has not been validated in all clinical situations. eGFR's persistently <60 mL/min signify possible Chronic Kidney Disease.    Anion gap 4 (L) 5 - 15    Comment: Performed at [facility], [address] [place], [place] [address]  CBC     Status: Abnormal   Collection Time: 04/05/18  5:04 AM  Result Value Ref Range   WBC 5.2 3.6 - 11.0 K/uL   RBC 3.30 (L) 3.80 - 5.20 MIL/uL   Hemoglobin 10.9 (L) 12.0 - 16.0 g/dL   HCT 32.2 (L) 35.0 - 47.0 %   MCV 97.5 80.0 - 100.0 fL   MCH 32.9 26.0 - 34.0 pg   MCHC 33.8 32.0 - 36.0 g/dL   RDW 14.4 11.5 - 14.5 %   Platelets 174 150 - 440 K/uL    Comment: Performed at [facility], [address] [place], [place] [address]  ECHOCARDIOGRAM COMPLETE     Status: None   Collection Time: 04/05/18  2:36 PM  Result Value Ref Range   Weight 2,176 oz   Height 64 in   BP 88/69 mmHg  Basic metabolic panel     Status: Abnormal   Collection Time: 04/06/18  4:41 AM  Result Value Ref Range   Sodium 138 135 - 145 mmol/L   Potassium 4.8 3.5 - 5.1 mmol/L   Chloride 108 98 - 111 mmol/L   CO2 23 22 - 32 mmol/L   Glucose, Bld 126 (H) 70 - 99 mg/dL   BUN 25 (H) 8 - 23 mg/dL   Creatinine, Ser 1.10 (H) 0.44 - 1.00 mg/dL   Calcium 9.5 8.9 - 10.3 mg/dL   GFR calc non Af Amer 53 (L) >60 mL/min   GFR calc Af Amer >60 >60 mL/min    Comment: (NOTE) The eGFR has been calculated using the CKD EPI equation. This calculation has not been validated in all clinical situations. eGFR's persistently <60 mL/min signify possible Chronic Kidney Disease.    Anion gap 7 5 - 15    Comment: Performed at [facility], [address] [place], [place] [address]  Glucose, capillary     Status: Abnormal   Collection Time: 04/06/18  8:02 AM  Result Value Ref Range   Glucose-Capillary  113 (H) 70 - 99 mg/dL  UA/M w/rflx Culture, Routine     Status: Abnormal   Collection Time: 06/27/18 12:00 AM  Result Value Ref Range   Specific Gravity, UA 1.021 1.005 - 1.030   pH, UA 5.0 5.0 - 7.5   Color, UA Yellow Yellow   Appearance Ur Clear Clear   Leukocytes, UA Trace (A) Negative   Protein, UA Negative  Negative/Trace   Glucose, UA Negative Negative   Ketones, UA Negative Negative   RBC, UA Negative Negative   Bilirubin, UA Negative Negative   Urobilinogen, Ur 0.2 0.2 - 1.0 mg/dL   Nitrite, UA Negative Negative   Microscopic Examination See below:     Comment: Microscopic was indicated and was performed.   Urinalysis Reflex Comment     Comment: This specimen has reflexed to a Urine Culture.  Microscopic Examination     Status: None   Collection Time: 06/27/18 12:00 AM  Result Value Ref Range   WBC, UA 0-5 0 - 5 /hpf   RBC, UA 0-2 0 - 2 /hpf   Epithelial Cells (non renal) 0-10 0 - 10 /hpf   Casts None seen None seen /lpf   Mucus, UA Present Not Estab.   Bacteria, UA Few None seen/Few  Urine Culture, Reflex     Status: None   Collection Time: 06/27/18 12:00 AM  Result Value Ref Range   Urine Culture, Routine Final report    Organism ID, Bacteria Comment     Comment: Culture shows less than 10,000 colony forming units of bacteria per milliliter of urine. This colony count is not generally considered to be clinically significant.   CBC     Status: Abnormal   Collection Time: 06/27/18  1:05 PM  Result Value Ref Range   WBC 4.6 4.0 - 10.5 K/uL   RBC 2.98 (L) 3.87 - 5.11 MIL/uL   Hemoglobin 9.3 (L) 12.0 - 15.0 g/dL   HCT 31.1 (L) 36.0 - 46.0 %   MCV 104.4 (H) 80.0 - 100.0 fL   MCH 31.2 26.0 - 34.0 pg   MCHC 29.9 (L) 30.0 - 36.0 g/dL   RDW 15.0 11.5 - 15.5 %   Platelets 257 150 - 400 K/uL   nRBC 0.0 0.0 - 0.2 %    Comment: Performed at [facility], [address] [place], [place] [address]  Comprehensive metabolic panel     Status: None   Collection  Time: 06/27/18  1:05 PM  Result Value Ref Range   Sodium 138 135 - 145 mmol/L   Potassium 4.1 3.5 - 5.1 mmol/L   Chloride 106 98 - 111 mmol/L   CO2 23 22 - 32 mmol/L   Glucose, Bld 88 70 - 99 mg/dL   BUN 15 8 - 23 mg/dL   Creatinine, Ser 0.85 0.44 - 1.00 mg/dL   Calcium 9.5 8.9 - 10.3 mg/dL   Total Protein 7.3 6.5 - 8.1 g/dL   Albumin 4.3 3.5 - 5.0 g/dL   AST 22 15 - 41 U/L   ALT 20 0 - 44 U/L   Alkaline Phosphatase 60 38 - 126 U/L   Total Bilirubin 0.7 0.3 - 1.2 mg/dL   GFR calc non Af Amer >60 >60 mL/min   GFR calc Af Amer >60 >60 mL/min    Comment: (NOTE) The eGFR has been calculated using the CKD EPI equation. This calculation has not been validated in all clinical situations. eGFR's persistently <60 mL/min signify possible Chronic Kidney Disease.    Anion gap 9 5 - 15    Comment: Performed at [facility], [address] [place], [place] [address]  Lipid panel     Status: None   Collection Time: 06/27/18  1:05 PM  Result Value Ref Range   Cholesterol 119 0 - 200 mg/dL   Triglycerides 54 <150 mg/dL   HDL 49 >40 mg/dL   Total  CHOL/HDL Ratio 2.4 RATIO   VLDL 11 0 - 40 mg/dL   LDL Cholesterol 59 0 - 99 mg/dL    Comment:        Total Cholesterol/HDL:CHD Risk Coronary Heart Disease Risk Table                     Men   Women  1/2 Average Risk   3.4   3.3  Average Risk       5.0   4.4  2 X Average Risk   9.6   7.1  3 X Average Risk  23.4   11.0        Use the calculated Patient Ratio above and the CHD Risk Table to determine the patient's CHD Risk.        ATP III CLASSIFICATION (LDL):  <100     mg/dL   Optimal  100-129  mg/dL   Near or Above                    Optimal  130-159  mg/dL   Borderline  160-189  mg/dL   High  >190     mg/dL   Very High Performed at [facility], [address] [place], [place] [address]   TSH     Status: None   Collection Time: 06/27/18  1:05 PM  Result Value Ref Range   TSH 1.004 0.350 - 4.500 uIU/mL     Comment: Performed by a 3rd Generation assay with a functional sensitivity of <=0.01 uIU/mL. Performed at [facility], [address] [place], [place] [address]   T4, free     Status: None   Collection Time: 06/27/18  1:05 PM  Result Value Ref Range   Free T4 0.91 0.82 - 1.77 ng/dL    Comment: (NOTE) Biotin ingestion may interfere with free T4 tests. If the results are inconsistent with the TSH level, previous test results, or the clinical presentation, then consider biotin interference. If needed, order repeat testing after stopping biotin. Performed at [facility], [address], [place], [place] [address]   VITAMIN D 25 Hydroxy (Vit-D Deficiency, Fractures)     Status: None   Collection Time: 06/27/18  1:05 PM  Result Value Ref Range   Vit D, 25-Hydroxy 33.9 30.0 - 100.0 ng/mL    Comment: (NOTE) Vitamin D deficiency has been defined by the [place] practice guideline as a level of serum 25-OH vitamin D less than 20 ng/mL (1,2). The Endocrine Society went on to further define vitamin D insufficiency as a level between 21 and 29 ng/mL (2). 1. IOM (Institute of Medicine). 2010. Dietary reference   intakes for calcium and D. [place]: The   [employer]. 2. Holick MF, Binkley McSwain, Bischoff-Ferrari HA, et al.   Evaluation, treatment, and prevention of vitamin D   deficiency: an Endocrine Society clinical practice   guideline. JCEM. 2011 Jul; 96(7):1911-30. Performed At: [facility] [place], [place] [id number] Rush Farmer MD GO:[id number]     Assessment/Plan: 1. Medicare annual wellness visit, subsequent Assessment and plan of care reviewed, patient received education about smoking sessation  2. Chronic obstructive pulmonary disease, unspecified COPD type (HCC) Stable COPD, continue with current treatment - albuterol (PROVENTIL HFA;VENTOLIN HFA) 108 (90 Base) MCG/ACT  inhaler; Inhale 2 puffs into the lungs every 6 (six) hours as needed for wheezing or shortness of breath.  Dispense: 1 Inhaler;  Refill: 5 - budesonide-formoterol (SYMBICORT) 80-4.5 MCG/ACT inhaler; Inhale 2 puffs into the lungs daily.  Dispense: 1 Inhaler; Refill: 5  3. Mixed hyperlipidemia Hyperlipidemia is currently under control, continue with current treatment. Check fasting lipid panel and adjust as indicated.  - atorvastatin (LIPITOR) 80 MG tablet; Take 1 tablet (80 mg total) by mouth every morning.  Dispense: 30 tablet; Refill: 5  4. Cigarette nicotine dependence with nicotine-induced disorder Patient is educated on the risks and complications from cigarettes smoking, especially in regard of her COPD. Patient encouraged to try to reduce the amount of cigarettes a day. Patient currently refuses to take any medications to help with cigarette quitting.  5. Essential hypertension Currently doing well without medication to control bp. Continue to monitor.  5. Dysuria Routine annual lab - UA/M w/rflx Culture, Routine  General Counseling: Yassmine verbalizes understanding of the findings of todays visit and agrees with plan of treatment. I have discussed any further diagnostic evaluation that may be needed or ordered today. We also reviewed her medications today. she has been encouraged to call the office with any questions or concerns that should arise related to todays visit.    Counseling:  Hypertension Counseling:   The following hypertensive lifestyle modification were recommended and discussed:  1. Limiting alcohol intake to less than 1 oz/day of ethanol:(24 oz of beer or 8 oz of wine or 2 oz of 100-proof whiskey). 2. Take baby ASA 81 mg daily. 3. Importance of regular aerobic exercise and losing weight. 4. Reduce dietary saturated fat and cholesterol intake for overall cardiovascular health. 5. Maintaining adequate dietary potassium, calcium, and magnesium intake. 6. Regular  monitoring of the blood pressure. 7. Reduce sodium intake to less than 100 mmol/day (less than 2.3 gm of sodium or less than 6 gm of sodium choride)   This patient was seen by [place] with Dr Lavera Guise as a part of collaborative care agreement  Orders Placed This Encounter  Procedures  . Microscopic Examination  . Urine Culture, Reflex  . UA/M w/rflx Culture, Routine    Meds ordered this encounter  Medications  . albuterol (PROVENTIL HFA;VENTOLIN HFA) 108 (90 Base) MCG/ACT inhaler    Sig: Inhale 2 puffs into the lungs every 6 (six) hours as needed for wheezing or shortness of breath.    Dispense:  1 Inhaler    Refill:  5    Please fill with generic alternative preferred by her insurance.    Order Specific Question:   Supervising Provider    Answer:   Lavera Guise [[id number]]  . budesonide-formoterol (SYMBICORT) 80-4.5 MCG/ACT inhaler    Sig: Inhale 2 puffs into the lungs daily.    Dispense:  1 Inhaler    Refill:  5    Order Specific Question:   Supervising Provider    Answer:   Lavera Guise [[id number]]  . atorvastatin (LIPITOR) 80 MG tablet    Sig: Take 1 tablet (80 mg total) by mouth every morning.    Dispense:  30 tablet    Refill:  5    Order Specific Question:   Supervising Provider    Answer:   Lavera Guise [[id number]]    Time spent: [place], MD  Internal Medicine

## 2018-06-28 LAB — VITAMIN D 25 HYDROXY (VIT D DEFICIENCY, FRACTURES): Vit D, 25-Hydroxy: 33.9 ng/mL (ref 30.0–100.0)

## 2018-06-29 DIAGNOSIS — F17219 Nicotine dependence, cigarettes, with unspecified nicotine-induced disorders: Secondary | ICD-10-CM | POA: Insufficient documentation

## 2018-06-29 DIAGNOSIS — J441 Chronic obstructive pulmonary disease with (acute) exacerbation: Secondary | ICD-10-CM | POA: Insufficient documentation

## 2018-06-29 DIAGNOSIS — J449 Chronic obstructive pulmonary disease, unspecified: Secondary | ICD-10-CM | POA: Insufficient documentation

## 2018-06-29 DIAGNOSIS — R300 Dysuria: Secondary | ICD-10-CM | POA: Insufficient documentation

## 2018-06-29 LAB — UA/M W/RFLX CULTURE, ROUTINE
Bilirubin, UA: NEGATIVE
Glucose, UA: NEGATIVE
Ketones, UA: NEGATIVE
Nitrite, UA: NEGATIVE
PH UA: 5 (ref 5.0–7.5)
Protein, UA: NEGATIVE
RBC, UA: NEGATIVE
Specific Gravity, UA: 1.021 (ref 1.005–1.030)
Urobilinogen, Ur: 0.2 mg/dL (ref 0.2–1.0)

## 2018-06-29 LAB — URINE CULTURE, REFLEX

## 2018-06-29 LAB — MICROSCOPIC EXAMINATION: CASTS: NONE SEEN /LPF

## 2018-06-30 ENCOUNTER — Other Ambulatory Visit: Payer: Self-pay

## 2018-06-30 NOTE — Patient Outreach (Signed)
[facility]) Care Management  06/30/2018  [name] [birth year] [id number]   Medication Adherence call to Mrs. [name] left a message with patient's husband patient is due on Atorvastatin  80 mg and Olmesartan /HCTZ.  [name] is showing past due under [place] health Care Ins.   [facility] Management Direct Dial [phone]  Fax [phone] [name]

## 2018-08-06 DIAGNOSIS — I2510 Atherosclerotic heart disease of native coronary artery without angina pectoris: Secondary | ICD-10-CM | POA: Diagnosis not present

## 2018-08-06 DIAGNOSIS — I480 Paroxysmal atrial fibrillation: Secondary | ICD-10-CM | POA: Diagnosis not present

## 2018-08-06 DIAGNOSIS — Z719 Counseling, unspecified: Secondary | ICD-10-CM | POA: Diagnosis not present

## 2018-08-06 DIAGNOSIS — Z720 Tobacco use: Secondary | ICD-10-CM | POA: Diagnosis not present

## 2018-08-25 IMAGING — CT CT HEAD W/O CM
3 series · 15 of 45 positions shown, 18 images · non-contrast
Comparison: 09/02/2011

CLINICAL DATA: Headache pain to the left side of face and head

EXAM:
CT HEAD WITHOUT CONTRAST
TECHNIQUE: Contiguous axial images were obtained from the base of the skull
through the vertex without intravenous contrast.

[Series 3: head wo · axial · 0.39mm/px · z∈[-141,-26]mm · 9 of 28 slices shown, 12 images]
[im 3/28  brain]
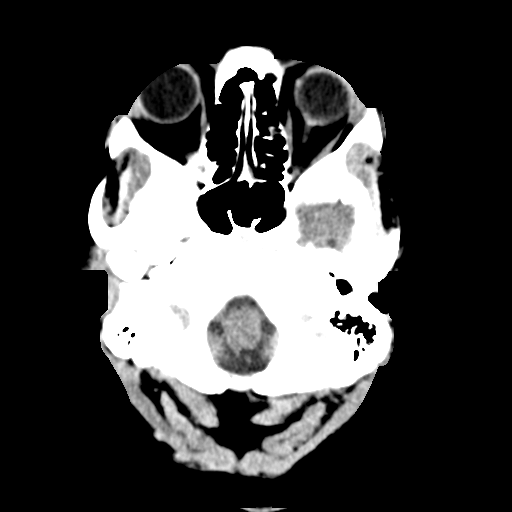
[im 3/28  bone]
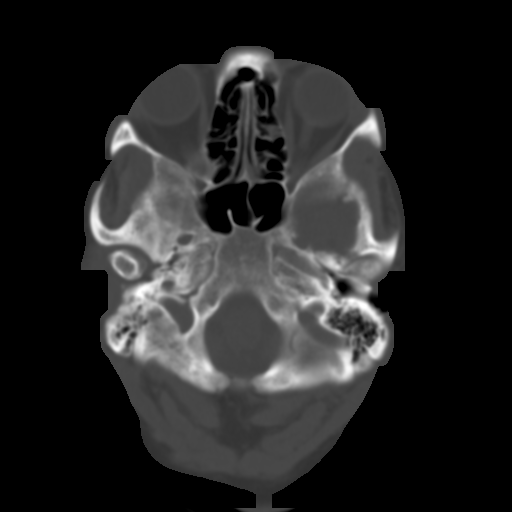
[im 6/28  brain]
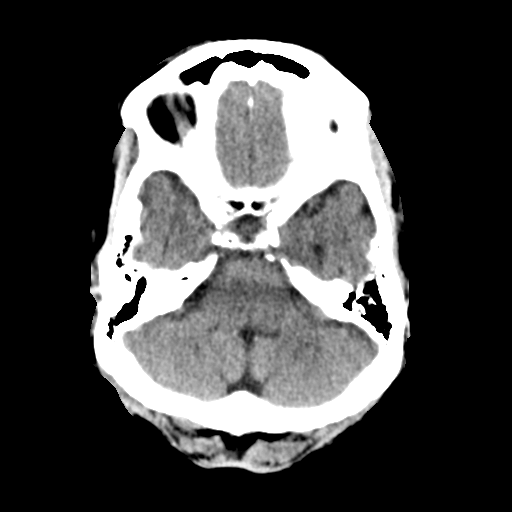
[im 9/28  brain]
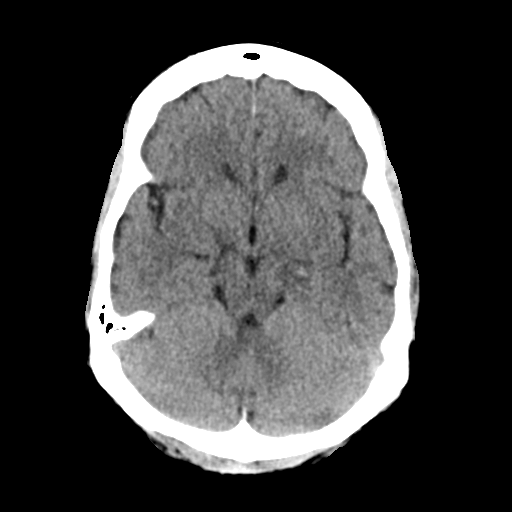
[im 12/28  brain]
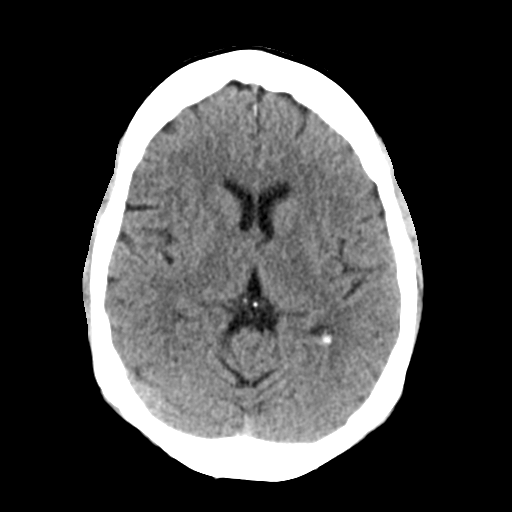
[im 15/28  brain]
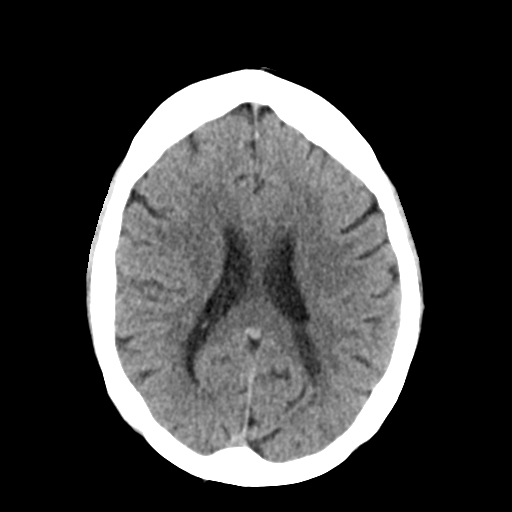
[im 15/28  bone]
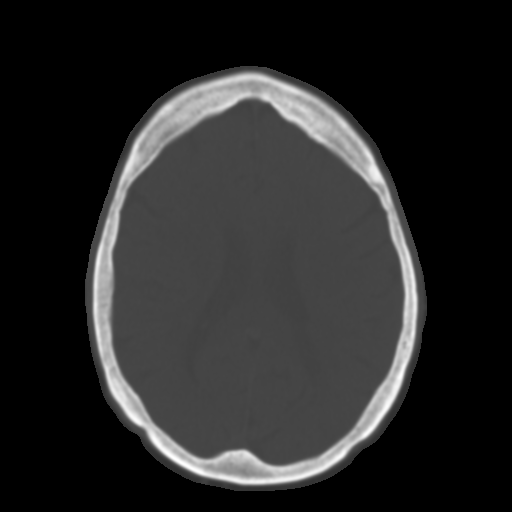
[im 17/28  brain]
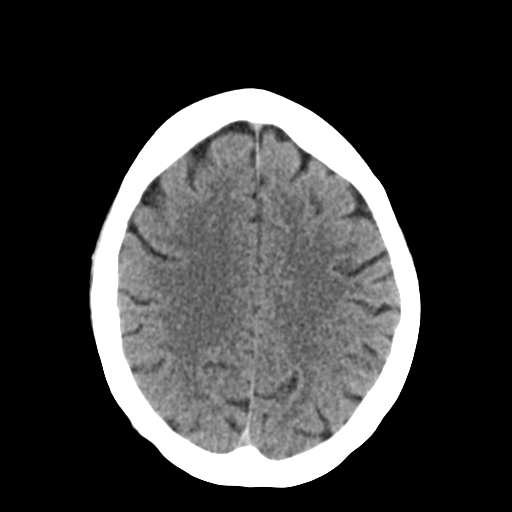
[im 20/28  brain]
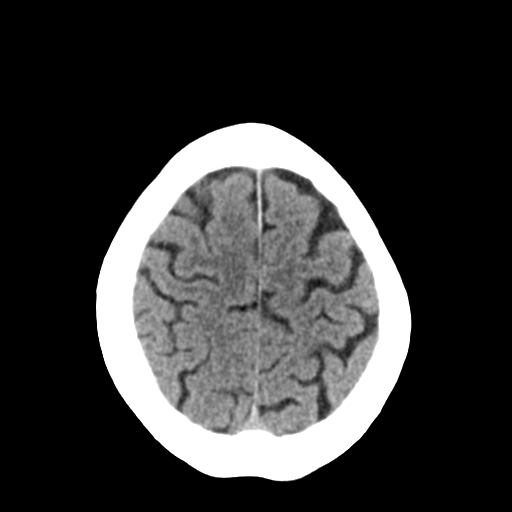
[im 23/28  brain]
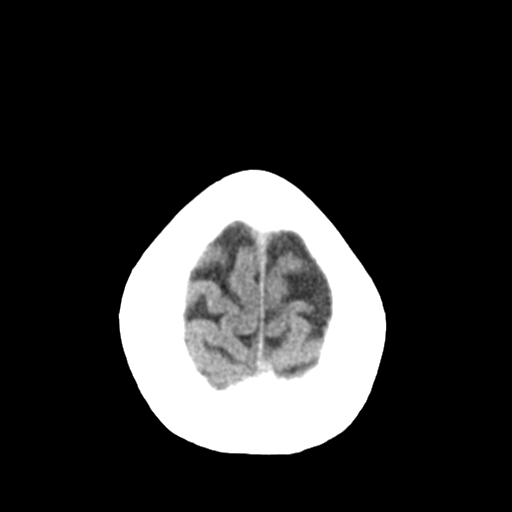
[im 26/28  brain]
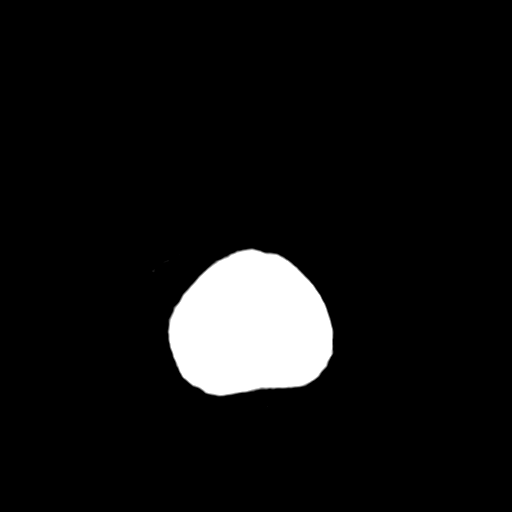
[im 26/28  bone]
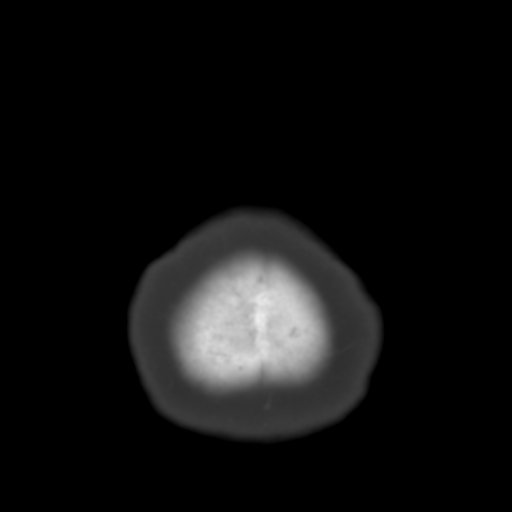

[Series 4: coronal soft tissue · coronal · 0.28mm/px · 3 of 60 slices shown]
[im 20/60  brain]
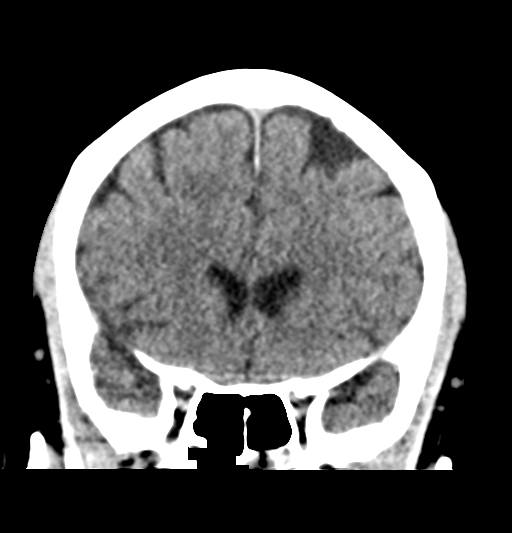
[im 27/60  brain]
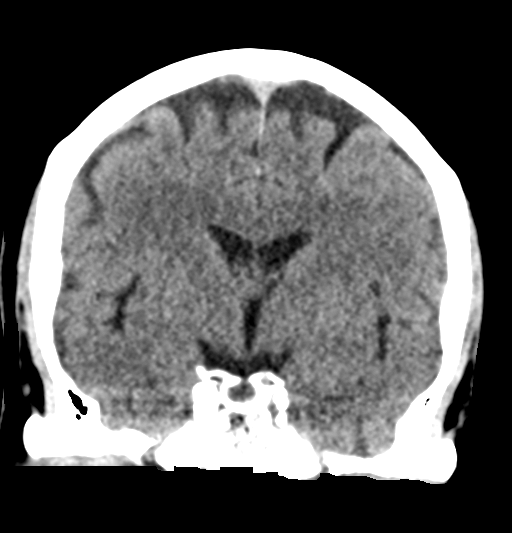
[im 33/60  brain]
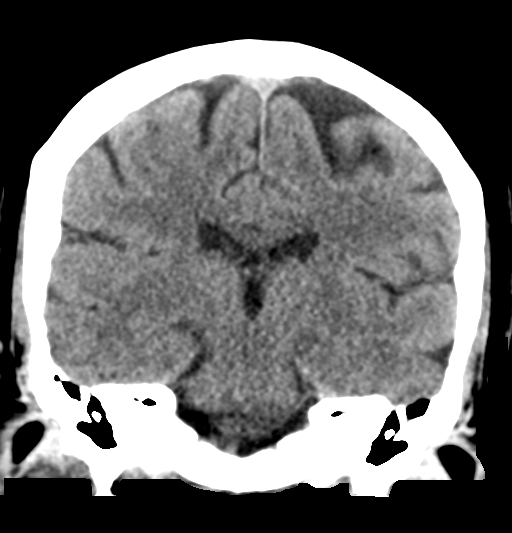

[Series 5: sagittal soft tissue · sagittal · 0.26mm/px · 3 of 49 slices shown]
[im 17/49  brain]
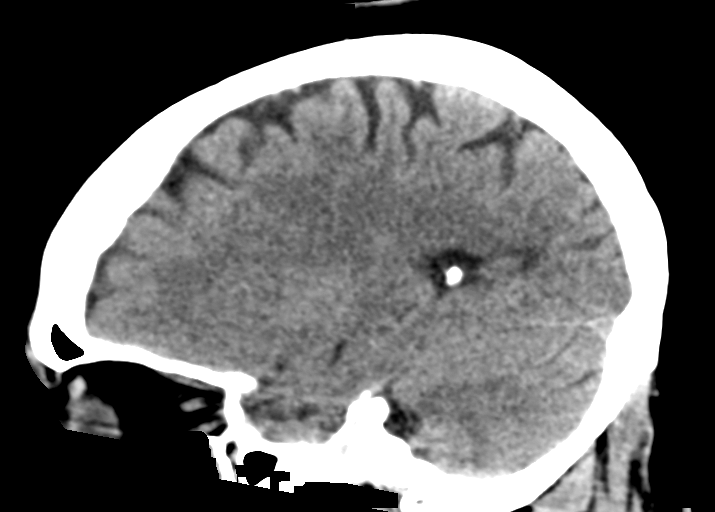
[im 25/49  brain]
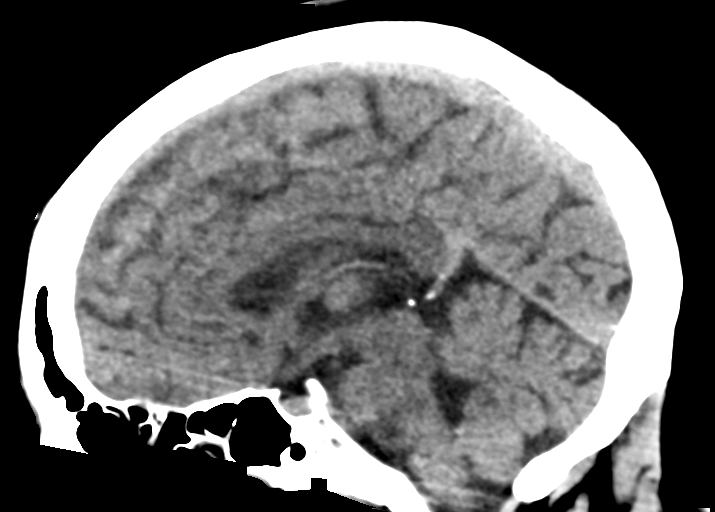
[im 33/49  brain]
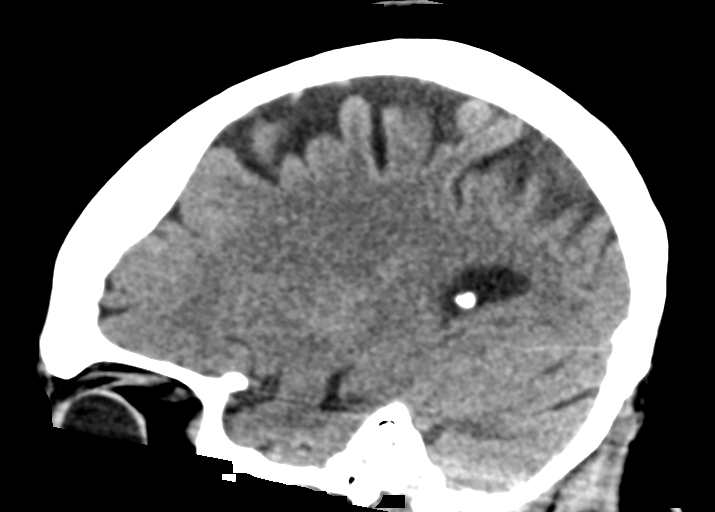

[15 of 45 positions shown; findings below may reference images not displayed]

FINDINGS: Brain: No evidence of acute infarction, hemorrhage, hydrocephalus,
extra-axial collection or mass lesion/mass effect.

Vascular: No hyperdense vessels. There are carotid artery
calcifications.

Skull: Normal. Negative for fracture or focal lesion.

Sinuses/Orbits: Fluid level in the right maxillary sinus. Ethmoidal
sinus mucosal thickening. No acute orbital abnormality.

Other: None
IMPRESSION: 1. No CT evidence for acute intracranial abnormality.
2. Sinusitis

## 2018-09-11 ENCOUNTER — Encounter: Payer: Self-pay | Admitting: Emergency Medicine

## 2018-09-11 ENCOUNTER — Emergency Department
Admission: EM | Admit: 2018-09-11 | Discharge: 2018-09-12 | Disposition: A | Discharge status: Home / Self Care | Payer: Medicare Other | Attending: Emergency Medicine | Admitting: Emergency Medicine

## 2018-09-11 ENCOUNTER — Other Ambulatory Visit: Payer: Self-pay

## 2018-09-11 DIAGNOSIS — F17210 Nicotine dependence, cigarettes, uncomplicated: Secondary | ICD-10-CM | POA: Insufficient documentation

## 2018-09-11 DIAGNOSIS — F32A Depression, unspecified: Secondary | ICD-10-CM

## 2018-09-11 DIAGNOSIS — Z79899 Other long term (current) drug therapy: Secondary | ICD-10-CM | POA: Insufficient documentation

## 2018-09-11 DIAGNOSIS — Z7982 Long term (current) use of aspirin: Secondary | ICD-10-CM | POA: Insufficient documentation

## 2018-09-11 DIAGNOSIS — R51 Headache: Secondary | ICD-10-CM | POA: Diagnosis not present

## 2018-09-11 DIAGNOSIS — F1010 Alcohol abuse, uncomplicated: Secondary | ICD-10-CM | POA: Diagnosis not present

## 2018-09-11 DIAGNOSIS — F10929 Alcohol use, unspecified with intoxication, unspecified: Secondary | ICD-10-CM

## 2018-09-11 DIAGNOSIS — F1994 Other psychoactive substance use, unspecified with psychoactive substance-induced mood disorder: Secondary | ICD-10-CM | POA: Diagnosis not present

## 2018-09-11 DIAGNOSIS — R0689 Other abnormalities of breathing: Secondary | ICD-10-CM | POA: Diagnosis not present

## 2018-09-11 DIAGNOSIS — F329 Major depressive disorder, single episode, unspecified: Secondary | ICD-10-CM

## 2018-09-11 DIAGNOSIS — R45851 Suicidal ideations: Secondary | ICD-10-CM | POA: Diagnosis not present

## 2018-09-11 DIAGNOSIS — J45909 Unspecified asthma, uncomplicated: Secondary | ICD-10-CM | POA: Insufficient documentation

## 2018-09-11 DIAGNOSIS — F1914 Other psychoactive substance abuse with psychoactive substance-induced mood disorder: Secondary | ICD-10-CM | POA: Insufficient documentation

## 2018-09-11 LAB — COMPREHENSIVE METABOLIC PANEL
ALT: 38 U/L (ref 0–44)
AST: 32 U/L (ref 15–41)
Albumin: 3.9 g/dL (ref 3.5–5.0)
Alkaline Phosphatase: 80 U/L (ref 38–126)
Anion gap: 10 (ref 5–15)
BUN: 12 mg/dL (ref 8–23)
CO2: 21 mmol/L — ABNORMAL LOW (ref 22–32)
Calcium: 9 mg/dL (ref 8.9–10.3)
Chloride: 107 mmol/L (ref 98–111)
Creatinine, Ser: 0.91 mg/dL (ref 0.44–1.00)
GFR calc Af Amer: >60 mL/min (ref >60)
GFR calc non Af Amer: >60 mL/min (ref >60)
Glucose, Bld: 98 mg/dL (ref 70–99)
Potassium: 3.7 mmol/L (ref 3.5–5.1)
Sodium: 138 mmol/L (ref 135–145)
Total Bilirubin: 0.3 mg/dL (ref 0.3–1.2)
Total Protein: 7 g/dL (ref 6.5–8.1)

## 2018-09-11 LAB — CBC
HCT: 38 % (ref 36.0–46.0)
Hemoglobin: 11.6 g/dL — ABNORMAL LOW (ref 12.0–15.0)
MCH: 31.6 pg (ref 26.0–34.0)
MCHC: 30.5 g/dL (ref 30.0–36.0)
MCV: 103.5 fL — ABNORMAL HIGH (ref 80.0–100.0)
Platelets: 191 10*3/uL (ref 150–400)
RBC: 3.67 MIL/uL — ABNORMAL LOW (ref 3.87–5.11)
RDW: 14.3 % (ref 11.5–15.5)
WBC: 3.8 10*3/uL — ABNORMAL LOW (ref 4.0–10.5)
nRBC: 0 % (ref 0.0–0.2)

## 2018-09-11 LAB — TROPONIN I: Troponin I: <0.03 ng/mL (ref <0.03)

## 2018-09-11 LAB — ETHANOL: Alcohol, Ethyl (B): 216 mg/dL — ABNORMAL HIGH (ref <10)

## 2018-09-11 MED ORDER — ONDANSETRON HCL 4 MG/2ML IJ SOLN
INTRAMUSCULAR | Status: AC
  Administered 2018-09-12: 4 mg via INTRAVENOUS
  Filled 2018-09-11: qty 2

## 2018-09-11 MED ORDER — ONDANSETRON HCL 4 MG/2ML IJ SOLN
4.0000 mg | Freq: Once | INTRAMUSCULAR | Status: AC
  Administered 2018-09-12: 4 mg via INTRAVENOUS
  Filled 2018-09-11: qty 2

## 2018-09-11 MED ORDER — LORAZEPAM 2 MG/ML IJ SOLN
INTRAMUSCULAR | Status: AC
  Administered 2018-09-11: 2 mg
  Filled 2018-09-11: qty 1

## 2018-09-11 MED ORDER — SODIUM CHLORIDE 0.9 % IV BOLUS
1000.0000 mL | Freq: Once | INTRAVENOUS | Status: AC
  Administered 2018-09-12: 1000 mL via INTRAVENOUS

## 2018-09-11 MED ORDER — ZIPRASIDONE MESYLATE 20 MG IM SOLR
20.0000 mg | Freq: Once | INTRAMUSCULAR | Status: AC
  Administered 2018-09-11: 20 mg via INTRAMUSCULAR
  Filled 2018-09-11: qty 20

## 2018-09-11 NOTE — ED Notes (Signed)
Hourly rounding reveals patient in room. No complaints, stable, in no acute distress. Q15 minute rounds and monitoring via Rover and Officer to continue.   
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
## 2018-09-11 NOTE — ED Notes (Signed)
Writer was unable to complete a thorough face to face assessment due to AMS.  Pt is extremely somnolent and unbale to to participate in assessment at this time. Writer has consulted with pts nurse states that pt has been recently medicated.  TTS will reassess the pt when pt becomes alert and oriented.

## 2018-09-11 NOTE — ED Notes (Signed)
Pt came in via EMS for alcohol intoxication. Patient reported [place]. She said she want to be with her mom. She said her mom passed away. Patient became aggressive,cursing, lashing out at staff, attempting to leave,tried to redirect. Nurse talked with her husband  and he  states that his wife has been drinking and denied Hx mental health. Husband left his phone number if any question. [phone].

## 2018-09-11 NOTE — ED Triage Notes (Signed)
Pt to ED via EMS from home c/o alcohol use tonight, unknown amount but patient states beer, pt keeps stating "I just want to die", "I want to see my mamma".  Pt presents voluntary.  EMS vitals 150/90 BP, 76 HR, 108 CBG, 98% RA.  Daughter who was on scene wanted pt to be IVC'd.  Daughter is Lysbeth Dicola and cell number [phone].

## 2018-09-12 DIAGNOSIS — F1010 Alcohol abuse, uncomplicated: Secondary | ICD-10-CM

## 2018-09-12 DIAGNOSIS — F1994 Other psychoactive substance use, unspecified with psychoactive substance-induced mood disorder: Secondary | ICD-10-CM

## 2018-09-12 DIAGNOSIS — F1914 Other psychoactive substance abuse with psychoactive substance-induced mood disorder: Secondary | ICD-10-CM | POA: Diagnosis not present

## 2018-09-12 LAB — ACETAMINOPHEN LEVEL: Acetaminophen (Tylenol), Serum: <10 ug/mL — ABNORMAL LOW (ref 10–30)

## 2018-09-12 LAB — SALICYLATE LEVEL: Salicylate Lvl: <7 mg/dL (ref 2.8–30.0)

## 2018-09-12 MED ORDER — BUTALBITAL-APAP-CAFFEINE 50-325-40 MG PO TABS
2.0000 | ORAL_TABLET | Freq: Once | ORAL | Status: AC
  Administered 2018-09-12: 2 via ORAL
  Filled 2018-09-12: qty 2

## 2018-09-12 MED ORDER — KETOROLAC TROMETHAMINE 30 MG/ML IJ SOLN
15.0000 mg | Freq: Once | INTRAMUSCULAR | Status: AC
  Administered 2018-09-12: 15 mg via INTRAVENOUS
  Filled 2018-09-12: qty 1

## 2018-09-12 NOTE — ED Notes (Signed)
She is sleeping soundly  - VSS    IVC    Awaiting psychiatric consult today  Continue to monitor

## 2018-09-12 NOTE — BH Assessment (Signed)
Assessment Note  [name] is an 63 y.o. female. female with a past medical history of asthma, COPD, CAD, hypertension, hyperlipidemia, MI, presents to the emergency department for depression and suicidal ideation possible overdose.  Pt staff interaction the Patient states she just wants to "be with my mom", states her mother is deceased.  Patient missed alcohol use.  States she took her home medications when asked if she took more than prescribed she states yes. Pt is extremely somnolent and unbale to to participate in assessment at this time. . Due to pts present mental status the following information was obtain by reviewing the pts charts, and consulting with collateral resources. The history is limited by the condition of the patient  Writer has spoken to pt significant other Cousin,[name] @ [phone] who reports no  previous mental health issues reported. No current or previous outpatient provider reported. No previous inpatient hospitalizations reported. He states that to his knowledge  the patient has never attempted to harm herself. He reports that they have been in a long-term relationship for 33 years. She currently lives with him and her daughter. Per his report "she's been fine and she had a good day on today." He states "she was drinking a little bit, we both drink off and on." He shares that she became upset today after they were an argument and her head began to hurt badly. He is unaware of any attempts to harm herself and the states that she typically takes her medication as prescribed. He shares that he and the patient's daughter decided to call EMS after she began to hold her head in excruciating pain.      Past Medical History:  Past Medical History:  Diagnosis Date  . Allergy    Environmental  . Arthritis   . Asthma   . Carotid artery stenosis   . Carotid stenosis   . COPD (chronic obstructive pulmonary disease) ([place])   . Coronary artery disease   . Coronary artery  disease   . Hemorrhoids   . Hyperlipidemia   . Hypertension   . 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[facility])    negative cardiac cath  . Nicotine addiction   . Peripheral vascular disease ([place])   . Pneumonia   . Shortness of breath dyspnea   . Tobacco abuse     Past Surgical History:  Procedure Laterality Date  . ABDOMINAL HYSTERECTOMY    . APPENDECTOMY    . CARDIAC CATHETERIZATION    . CARDIAC SURGERY    . CHOLECYSTECTOMY    . COLONOSCOPY  12/15/11   OH->bleeding internal hemorrhoids, otherwise normal  . COLONOSCOPY WITH PROPOFOL N/A 04/12/2015   Procedure: COLONOSCOPY WITH PROPOFOL;  Surgeon: Lucilla Lame, MD;  Location: [facility] ENDOSCOPY;  Service: Endoscopy;  Laterality: N/A;  . ENDARTERECTOMY FEMORAL Left 04/26/2015   Procedure: ENDARTERECTOMY FEMORAL;  Surgeon: Algernon Huxley, MD;  Location: [facility] ORS;  Service: Vascular;  Laterality: Left;  . ENDOVASCULAR STENT INSERTION Bilateral    Legs  . FLEXIBLE BRONCHOSCOPY N/A 02/02/2015   Procedure: FLEXIBLE BRONCHOSCOPY;  Surgeon: Allyne Gee, MD;  Location: [facility] ORS;  Service: Pulmonary;  Laterality: N/A;  . HEMORRHOID SURGERY    . PERIPHERAL VASCULAR CATHETERIZATION Left 04/25/2015   Procedure: Lower Extremity Angiography;  Surgeon: Algernon Huxley, MD;  Location: [place] CV LAB;  Service: Cardiovascular;  Laterality: Left;  . PERIPHERAL VASCULAR CATHETERIZATION Left 04/25/2015   Procedure: Lower Extremity Intervention;  Surgeon: Algernon Huxley, MD;  Location: [place] CV LAB;  Service: Cardiovascular;  Laterality: Left;  . PERIPHERAL VASCULAR CATHETERIZATION Left 01/02/2016   Procedure: Lower Extremity Angiography;  Surgeon: Algernon Huxley, MD;  Location: [place] CV LAB;  Service: Cardiovascular;  Laterality: Left;  . PERIPHERAL VASCULAR CATHETERIZATION  01/02/2016   Procedure: Lower Extremity Intervention;  Surgeon: Algernon Huxley, MD;  Location: [place] CV LAB;  Service: Cardiovascular;;  . THROMBECTOMY FEMORAL ARTERY Left 04/26/2015   Procedure:  THROMBECTOMY FEMORAL ARTERY;  Surgeon: Algernon Huxley, MD;  Location: [facility] ORS;  Service: Vascular;  Laterality: Left;    Family History:  Family History  Problem Relation Age of Onset  . Hypertension Mother   . Hypertension Father     Social History:  reports that she has been smoking cigarettes. She has a 41.00 pack-year smoking history. She has never used smokeless tobacco. She reports current alcohol use. She reports that she does not use drugs.  Additional Social History:  Alcohol / Drug Use Pain Medications: SEE MAR Prescriptions: SEE MAR  History of alcohol / drug use?: Yes Longest period of sobriety (when/how long): UTA  CIWA: CIWA-Ar BP: (!) 147/66 Pulse Rate: 65 COWS:    Allergies:  Allergies  Allergen Reactions  . Aspirin Nausea And Vomiting and Other (See Comments)    Pt states aspirin makes her cramp and have to use coated kind.  . Tramadol   . Zyrtec [Cetirizine]     Home Medications: (Not in a hospital admission)   OB/GYN Status:  No LMP recorded (lmp unknown). Patient has had a hysterectomy.  General Assessment Data Assessment unable to be completed: Yes Reason for not completing assessment: Medicated  Location of Assessment: [facility] ED TTS Assessment: In system Is this a Tele or Face-to-Face Assessment?: Face-to-Face Is this an Initial Assessment or a Re-assessment for this encounter?: Initial Assessment Patient Accompanied by:: N/A Living Arrangements: Other (Comment) What gender do you identify as?: Female Marital status: Long term relationship Living Arrangements: Children, Spouse/significant other Can pt return to current living arrangement?: Yes Admission Status: Involuntary Petitioner: ED Attending Is patient capable of signing voluntary admission?: Yes Referral Source: Self/Family/Friend  Medical Screening Exam [facility] Walk-in ONLY) Medical Exam completed: Yes  Crisis Care Plan Living Arrangements: Children, Spouse/significant other Name of  Psychiatrist: [place] Name of Therapist: UTA  Education Status Is patient currently in school?: No  Risk to self with the past 6 months Suicidal Ideation: (UTA) Has patient been a risk to self within the past 6 months prior to admission? : Other (comment) Suicidal Intent: (UTA) Has patient had any suicidal intent within the past 6 months prior to admission? : Other (comment)(UTA) Is patient at risk for suicide?: (UTA) Suicidal Plan?: (UTA) Has patient had any suicidal plan within the past 6 months prior to admission? : Other (comment) Access to Means: (UTA) What has been your use of drugs/alcohol within the last 12 months?: Alcohol use  Previous Attempts/Gestures: (UTA) Recent stressful life event(s): Other (Comment)(UTA) Persecutory voices/beliefs?: Pincus Badder) Depression: (UTA) Substance abuse history and/or treatment for substance abuse?: Yes(Alcohol use )  Risk to Others within the past 6 months Homicidal Ideation: (Alcohol use ) Does patient have any lifetime risk of violence toward others beyond the six months prior to admission? : Unknown Thoughts of Harm to Others: (UTA) Current Homicidal Intent: (Alcohol use ) Current Homicidal Plan: No Access to Homicidal Means: (UTA) History of harm to others?: No Assessment of Violence: None Noted Does patient have access to weapons?: (UTA) Criminal Charges Pending?: (UTA) Does patient  have a court date: (UTA) Is patient on probation?: Unknown  Psychosis Hallucinations: (UTA) Delusions: (UTA)  Mental Status Report Appearance/Hygiene: In scrubs Eye Contact: Unable to Assess Motor Activity: Unable to assess Speech: Unable to assess Level of Consciousness: Sleeping, Sedated, Unresponsive (To pain or command) Mood: Other (Comment)(UTA) Affect: Other (Comment), Unable to Assess Anxiety Level: (UTA) Thought Processes: Unable to Assess Judgement: Unable to Assess Orientation: Unable to assess Obsessive Compulsive Thoughts/Behaviors:  Unable to Assess  Cognitive Functioning Concentration: Unable to Assess Memory: Unable to Assess Is patient IDD: No Impulse Control: Unable to Assess Appetite: (UTA) Have you had any weight changes? : (UTA) Sleep: Unable to Assess  ADLScreening [facility] Assessment Services) Patient's cognitive ability adequate to safely complete daily activities?: Yes Patient able to express need for assistance with ADLs?: Yes Independently performs ADLs?: Yes (appropriate for developmental age)  Prior Inpatient Therapy Prior Inpatient Therapy: No  Prior Outpatient Therapy Prior Outpatient Therapy: No Does patient have an ACCT team?: No Does patient have Intensive In-House Services?  : No Does patient have [facility] services? : No Does patient have P4CC services?: No  ADL Screening (condition at time of admission) Patient's cognitive ability adequate to safely complete daily activities?: Yes Patient able to express need for assistance with ADLs?: Yes Independently performs ADLs?: Yes (appropriate for developmental age)       Abuse/Neglect Assessment (Assessment to be complete while patient is alone) Abuse/Neglect Assessment Can Be Completed: Unable to assess, patient is non-responsive or altered mental status     Advance Directives (For Healthcare) Does Patient Have a Medical Advance Directive?: No Would patient like information on creating a medical advance directive?: No - Patient declined(Pt not answering question, UTA)          Disposition:  Disposition Initial Assessment Completed for this Encounter: Yes Patient referred to: Other (Comment)(Consult with Psyh MD)  On Site Evaluation by:   Reviewed with Physician:    Laretta Alstrom 09/12/2018 6:49 AM

## 2018-09-12 NOTE — ED Notes (Addendum)

## 2018-09-12 NOTE — ED Notes (Signed)
Hourly rounding reveals patient in room. No complaints, stable, in no acute distress. Q15 minute rounds and monitoring via Rover and Officer to continue.   

## 2018-09-12 NOTE — ED Notes (Signed)
Pt awake and has removed all VS cords  Pt states  "I took this stuff off and do not put it back on me"   Door closed  Assessment completed  Continue to monitor

## 2018-09-12 NOTE — ED Provider Notes (Signed)
-----------------------------------------   1:23 AM on 09/12/2018 -----------------------------------------   There were no vitals taken for this visit.  The patient is calm and cooperative at this time.  There have been no acute events since the last update.  Lab work unremarkable aside from elevated EtOH level and high MCV c/w chronic EtOH abuse.  Medically stable for psychiatric evaluation at this point.   Darel Hong, MD 09/12/18 [phone]

## 2018-09-12 NOTE — ED Notes (Signed)
BEHAVIORAL HEALTH ROUNDING Patient sleeping: No. Patient alert and oriented: yes Behavior appropriate: Yes.  ; If no, describe:  Nutrition and fluids offered: yes Toileting and hygiene offered: Yes  Sitter present: q15 minute observations and security  monitoring Law enforcement present: Yes  ODS  

## 2018-09-12 NOTE — ED Notes (Addendum)
BEHAVIORAL HEALTH ROUNDING Patient sleeping: No. Patient alert and oriented: yes Behavior appropriate: Yes.  ; If no, describe:  Nutrition and fluids offered: yes Toileting and hygiene offered: Yes  Sitter present: q15 minute observations and security monitoring Law enforcement present: Yes  ODS   She continues to wait for a psychiatric consult

## 2018-09-12 NOTE — ED Provider Notes (Signed)
Patient seen by Dr. Weber Cooks. Thinks patient is safe to go home at this time. Will plan on discharging home.    Nance Pear, MD 09/12/18 [phone]

## 2018-09-12 NOTE — Consult Note (Signed)
[facility] Consult   Reason for Consult: Consult for this 63 year old woman who came into the hospital intoxicated making suicidal statements Referring Physician: Archie Balboa Patient Identification: Olinda Nola MRN:  [id number] Principal Diagnosis: Substance induced mood disorder ([place]) Diagnosis:  Principal Problem:   Substance induced mood disorder ([place]) Active Problems:   Alcohol abuse   Total Time spent with patient: 1 hour  Subjective:   Ramah Langhans is a 63 y.o. female patient admitted with "I had an episode".  HPI: Patient seen chart reviewed.  Patient is a 63 year old woman who came to the hospital intoxicated last night saying that she was having suicidal thoughts and wanted to "be with her mother".  Patient is sober this afternoon tells me that she drank way too much last night.  She drank by her estimate about twice as much alcohol as she normally does.  She has been feeling very stressed out recently in large part because several members of her extended family are living with her and her boyfriend causing the house to be far too crowded and stressful.  Mood stays down and irritated much of the time.  Some chronic sleep problems.  Not currently receiving any outpatient mental health treatment.  Denies suicidal thoughts denies suicidal plan denies any hallucinations.  Denies that she is abusing any other drugs currently.  Social history: Not working.  Lives with her boyfriend.  Multiple other members of extended family staying with her which is very stressful  Since abuse history: Has had a long history probably of overusing alcohol.  No history of DTs or seizures.  Does not sound like she is ever engaged in treatment.  Medical history: Multiple medical problems including history of severe pneumonia in the past  Past Psychiatric History: Denies having ever been on any psychiatric medicine denies any history of suicide attempts or violence.  Risk to Self: Suicidal  Ideation: (UTA) Suicidal Intent: (UTA) Is patient at risk for suicide?: (UTA) Suicidal Plan?: (UTA) Access to Means: (UTA) What has been your use of drugs/alcohol within the last 12 months?: Alcohol use  Risk to Others: Homicidal Ideation: (Alcohol use ) Thoughts of Harm to Others: (UTA) Current Homicidal Intent: (Alcohol use ) Current Homicidal Plan: No Access to Homicidal Means: (UTA) History of harm to others?: No Assessment of Violence: None Noted Does patient have access to weapons?: (UTA) Criminal Charges Pending?: (UTA) Does patient have a court date: (UTA) Prior Inpatient Therapy: Prior Inpatient Therapy: No Prior Outpatient Therapy: Prior Outpatient Therapy: No Does patient have an ACCT team?: No Does patient have Intensive In-House Services?  : No Does patient have [facility] services? : No Does patient have P4CC services?: No  Past Medical History:  Past Medical History:  Diagnosis Date  . Allergy    Environmental  . Arthritis   . Asthma   . Carotid artery stenosis   . Carotid stenosis   . COPD (chronic obstructive pulmonary disease) ([place])   . Coronary artery disease   . Coronary artery disease   . Hemorrhoids   . Hyperlipidemia   . Hypertension   . 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[facility])    negative cardiac cath  . Nicotine addiction   . Peripheral vascular disease ([place])   . Pneumonia   . Shortness of breath dyspnea   . Tobacco abuse     Past Surgical History:  Procedure Laterality Date  . ABDOMINAL HYSTERECTOMY    . APPENDECTOMY    . CARDIAC CATHETERIZATION    . CARDIAC SURGERY    .  CHOLECYSTECTOMY    . COLONOSCOPY  12/15/11   OH->bleeding internal hemorrhoids, otherwise normal  . COLONOSCOPY WITH PROPOFOL N/A 04/12/2015   Procedure: COLONOSCOPY WITH PROPOFOL;  Surgeon: Lucilla Lame, MD;  Location: [facility] ENDOSCOPY;  Service: Endoscopy;  Laterality: N/A;  . ENDARTERECTOMY FEMORAL Left 04/26/2015   Procedure: ENDARTERECTOMY FEMORAL;  Surgeon: Algernon Huxley, MD;  Location:  [facility] ORS;  Service: Vascular;  Laterality: Left;  . ENDOVASCULAR STENT INSERTION Bilateral    Legs  . FLEXIBLE BRONCHOSCOPY N/A 02/02/2015   Procedure: FLEXIBLE BRONCHOSCOPY;  Surgeon: Allyne Gee, MD;  Location: [facility] ORS;  Service: Pulmonary;  Laterality: N/A;  . HEMORRHOID SURGERY    . PERIPHERAL VASCULAR CATHETERIZATION Left 04/25/2015   Procedure: Lower Extremity Angiography;  Surgeon: Algernon Huxley, MD;  Location: [facility];  Service: Cardiovascular;  Laterality: Left;  . PERIPHERAL VASCULAR CATHETERIZATION Left 04/25/2015   Procedure: Lower Extremity Intervention;  Surgeon: Algernon Huxley, MD;  Location: [facility];  Service: Cardiovascular;  Laterality: Left;  . PERIPHERAL VASCULAR CATHETERIZATION Left 01/02/2016   Procedure: Lower Extremity Angiography;  Surgeon: Algernon Huxley, MD;  Location: [facility];  Service: Cardiovascular;  Laterality: Left;  . PERIPHERAL VASCULAR CATHETERIZATION  01/02/2016   Procedure: Lower Extremity Intervention;  Surgeon: Algernon Huxley, MD;  Location: [facility];  Service: Cardiovascular;;  . THROMBECTOMY FEMORAL ARTERY Left 04/26/2015   Procedure: THROMBECTOMY FEMORAL ARTERY;  Surgeon: Algernon Huxley, MD;  Location: [facility] ORS;  Service: Vascular;  Laterality: Left;   Family History:  Family History  Problem Relation Age of Onset  . Hypertension Mother   . Hypertension Father    Family Psychiatric  History: Denies any Social History:  Social History   Substance and Sexual Activity  Alcohol Use Yes  . Alcohol/week: 0.0 standard drinks   Comment: weekends, couple beers, pint of liquer only on weekends     Social History   Substance and Sexual Activity  Drug Use No    Social History   Socioeconomic History  . Marital status: Single    Spouse name: Not on file  . Number of children: Not on file  . Years of education: Not on file  . Highest education level: Not on file  Occupational History  . Not on file  Social  Needs  . Financial resource strain: Not hard at all  . Food insecurity:    Worry: Never true    Inability: Never true  . Transportation needs:    Medical: No    Non-medical: No  Tobacco Use  . Smoking status: Current Every Day Smoker    Packs/day: 1.00    Years: 41.00    Pack years: 41.00    Types: Cigarettes  . Smokeless tobacco: Never Used  . Tobacco comment: pt recommend to stop smoking 4 min spent will start on prn nicotine replacment  Substance and Sexual Activity  . Alcohol use: Yes    Alcohol/week: 0.0 standard drinks    Comment: weekends, couple beers, pint of liquer only on weekends  . Drug use: No  . Sexual activity: Not Currently  Lifestyle  . Physical activity:    Days per week: 0 days    Minutes per session: 0 min  . Stress: Only a little  Relationships  . Social connections:    Talks on phone: Once a week    Gets together: Once a week    Attends religious service: 1 to 4 times per  year    Active member of club or organization: No    Attends meetings of clubs or organizations: Never    Relationship status: Never married  Other Topics Concern  . Not on file  Social History Narrative   Lives with family    Additional Social History:    Allergies:   Allergies  Allergen Reactions  . Aspirin Nausea And Vomiting and Other (See Comments)    Pt states aspirin makes her cramp and have to use coated kind.  . Tramadol   . Zyrtec [Cetirizine]     Labs:  Results for orders placed or performed during the hospital encounter of 09/11/18 (from the past 48 hour(s))  CBC     Status: Abnormal   Collection Time: 09/11/18 11:16 PM  Result Value Ref Range   WBC 3.8 (L) 4.0 - 10.5 K/uL   RBC 3.67 (L) 3.87 - 5.11 MIL/uL   Hemoglobin 11.6 (L) 12.0 - 15.0 g/dL   HCT 38.0 36.0 - 46.0 %   MCV 103.5 (H) 80.0 - 100.0 fL   MCH 31.6 26.0 - 34.0 pg   MCHC 30.5 30.0 - 36.0 g/dL   RDW 14.3 11.5 - 15.5 %   Platelets 191 150 - 400 K/uL   nRBC 0.0 0.0 - 0.2 %    Comment:  Performed at [facility], [address] [place], [place] [address]  Comprehensive metabolic panel     Status: Abnormal   Collection Time: 09/11/18 11:16 PM  Result Value Ref Range   Sodium 138 135 - 145 mmol/L   Potassium 3.7 3.5 - 5.1 mmol/L   Chloride 107 98 - 111 mmol/L   CO2 21 (L) 22 - 32 mmol/L   Glucose, Bld 98 70 - 99 mg/dL   BUN 12 8 - 23 mg/dL   Creatinine, Ser 0.91 0.44 - 1.00 mg/dL   Calcium 9.0 8.9 - 10.3 mg/dL   Total Protein 7.0 6.5 - 8.1 g/dL   Albumin 3.9 3.5 - 5.0 g/dL   AST 32 15 - 41 U/L   ALT 38 0 - 44 U/L   Alkaline Phosphatase 80 38 - 126 U/L   Total Bilirubin 0.3 0.3 - 1.2 mg/dL   GFR calc non Af Amer >60 >60 mL/min   GFR calc Af Amer >60 >60 mL/min   Anion gap 10 5 - 15    Comment: Performed at [facility], [address] [place], [place] [address]  Troponin I - ONCE - STAT     Status: None   Collection Time: 09/11/18 11:16 PM  Result Value Ref Range   Troponin I <0.03 <0.03 ng/mL    Comment: Performed at [facility], [address] [place], [place] [address]  Acetaminophen level     Status: Abnormal   Collection Time: 09/11/18 11:16 PM  Result Value Ref Range   Acetaminophen (Tylenol), Serum <10 (L) 10 - 30 ug/mL    Comment: (NOTE) Therapeutic concentrations vary significantly. A range of 10-30 ug/mL  may be an effective concentration for many patients. However, some  are best treated at concentrations outside of this range. Acetaminophen concentrations >150 ug/mL at 4 hours after ingestion  and >50 ug/mL at 12 hours after ingestion are often associated with  toxic reactions. Performed at [facility], [address] [place], [place] [address]   Salicylate level     Status: None   Collection Time: 09/11/18 11:16 PM  Result Value Ref Range   Salicylate Lvl <  7.0 2.8 - 30.0 mg/dL    Comment: Performed at [facility], [address] [place], [place] [address]  Ethanol     Status: Abnormal    Collection Time: 09/11/18 11:16 PM  Result Value Ref Range   Alcohol, Ethyl (B) 216 (H) <10 mg/dL    Comment: (NOTE) Lowest detectable limit for serum alcohol is 10 mg/dL. For medical purposes only. Performed at [facility], [address] [place], [place] [address]     No current facility-administered medications for this encounter.    Current Outpatient Medications  Medication Sig Dispense Refill  . albuterol (PROVENTIL HFA;VENTOLIN HFA) 108 (90 Base) MCG/ACT inhaler Inhale 2 puffs into the lungs every 6 (six) hours as needed for wheezing or shortness of breath. 1 Inhaler 5  . aspirin EC 81 MG EC tablet Take 1 tablet (81 mg total) by mouth daily. 90 tablet 1  . atorvastatin (LIPITOR) 80 MG tablet Take 1 tablet (80 mg total) by mouth every morning. 30 tablet 5  . budesonide-formoterol (SYMBICORT) 80-4.5 MCG/ACT inhaler Inhale 2 puffs into the lungs daily. 1 Inhaler 5  . cetirizine (ZYRTEC) 10 MG tablet Take 10 mg by mouth daily.    . cholecalciferol (VITAMIN D) 400 units TABS tablet Take 400 Units by mouth.    Arne [place] 5 MG TABS tablet Take 5 mg by mouth 2 (two) times daily.  1  . ezetimibe (ZETIA) 10 MG tablet Take 10 mg by mouth daily.  0  . ferrous sulfate 325 (65 FE) MG tablet Take 325 mg by mouth daily.      Musculoskeletal: Strength & Muscle Tone: within normal limits Gait & Station: normal Patient leans: N/A  Psychiatric Specialty Exam: Physical Exam  Nursing note and vitals reviewed. Constitutional: She appears well-developed and well-nourished.  HENT:  Head: Normocephalic and atraumatic.  Eyes: Pupils are equal, round, and reactive to light. Conjunctivae are normal.  Neck: Normal range of motion.  Cardiovascular: Regular rhythm and normal heart sounds.  Respiratory: Effort normal. No respiratory distress.  GI: Soft.  Musculoskeletal: Normal range of motion.  Neurological: She is alert.  Skin: Skin is warm and dry.  Psychiatric: Her speech is  delayed. She is slowed. Cognition and memory are impaired. She expresses impulsivity. She exhibits a depressed mood. She expresses no suicidal ideation.    Review of Systems  Constitutional: Negative.   HENT: Negative.   Eyes: Negative.   Respiratory: Negative.   Cardiovascular: Negative.   Gastrointestinal: Negative.   Musculoskeletal: Negative.   Skin: Negative.   Neurological: Negative.   Psychiatric/Behavioral: Positive for depression, memory loss and substance abuse. Negative for hallucinations and suicidal ideas. The patient is nervous/anxious and has insomnia.     Blood pressure 138/68, pulse 90, temperature 98.9 F (37.2 C), temperature source Oral, resp. rate 18, SpO2 95 %.There is no height or weight on file to calculate BMI.  General Appearance: Casual  Eye Contact:  Minimal  Speech:  Slow  Volume:  Decreased  Mood:  Depressed  Affect:  Depressed and Tearful  Thought Process:  Goal Directed  Orientation:  Full (Time, Place, and Person)  Thought Content:  Logical  Suicidal Thoughts:  No  Homicidal Thoughts:  No  Memory:  Immediate;   Fair Recent;   Fair Remote;   Fair  Judgement:  Fair  Insight:  Fair  Psychomotor Activity:  Decreased  Concentration:  Concentration: Poor  Recall:  [employer] of Knowledge:  Fair  Language:  Fair  Akathisia:  No  Handed:  Right  AIMS (if indicated):     Assets:  Communication Skills Desire for Improvement Housing Social Support  ADL's:  Intact  Cognition:  Impaired,  Mild  Sleep:        Treatment Plan Summary: Plan 63 year old woman came into the hospital intoxicated making suicidal statements but with no evidence that she had done anything to attempt to harm herself.  Sober now she completely denies suicidal ideation.  Admits to being under a lot of stress and having some symptoms of depression but is resistant to engaging in any treatment.  Spoke with her about my recommendation that we try antidepressant medicine but she  declines it.  We will recommend that she go to follow up and give her information about local mental health resources.  Otherwise however she can be taken off the involuntary commitment which no longer is appropriate and can be discharged case reviewed with emergency room doctor.  Disposition: No evidence of imminent risk to self or others at present.   Patient does not meet criteria for psychiatric inpatient admission. Supportive therapy provided about ongoing stressors. Discussed crisis plan, support from social network, calling 911, coming to the Emergency Department, and calling Suicide Hotline.  Alethia Berthold, MD 09/12/2018 6:31 PM

## 2018-09-12 NOTE — ED Notes (Signed)
ED  Is the patient under IVC or is there intent for IVC: Yes.   Is the patient medically cleared: Yes.   Is there vacancy in the ED BHU: Yes.   Is the population mix appropriate for patient: Yes.   Is the patient awaiting placement in inpatient or outpatient setting:  Has the patient had a psychiatric consult:  Consult pending  Survey of unit performed for contraband, proper placement and condition of furniture, tampering with fixtures in bathroom, shower, and each patient room: Yes.  ; Findings:  APPEARANCE/BEHAVIOR Calm and cooperative NEURO ASSESSMENT Orientation: oriented x3  Denies pain Hallucinations: No.None noted (Hallucinations) denies  Speech: Normal Gait: normal RESPIRATORY ASSESSMENT Even  Unlabored respirations  CARDIOVASCULAR ASSESSMENT Pulses equal   regular rate  Skin warm and dry   GASTROINTESTINAL ASSESSMENT no GI complaint EXTREMITIES Full ROM  PLAN OF CARE Provide calm/safe environment. Vital signs assessed twice daily. ED BHU Assessment once each 12-hour shift. Collaborate with TTS daily or as condition indicates. Assure the ED provider has rounded once each shift. Provide and encourage hygiene. Provide redirection as needed. Assess for escalating behavior; address immediately and inform ED provider.  Assess family dynamic and appropriateness for visitation as needed: Yes.  ; If necessary, describe findings:  Educate the patient/family about BHU procedures/visitation: Yes.  ; If necessary, describe findings:

## 2018-09-12 NOTE — Discharge Instructions (Addendum)
Please seek medical attention and help for any thoughts about wanting to harm yourself, harm others, any concerning change in behavior, severe depression, inappropriate drug use or any other new or concerning symptoms. ° °

## 2018-09-25 ENCOUNTER — Ambulatory Visit: Payer: Self-pay | Admitting: Internal Medicine

## 2018-10-06 ENCOUNTER — Ambulatory Visit (INDEPENDENT_AMBULATORY_CARE_PROVIDER_SITE_OTHER): Payer: Medicare Other | Admitting: Internal Medicine

## 2018-10-06 ENCOUNTER — Encounter: Payer: Self-pay | Admitting: Internal Medicine

## 2018-10-06 VITALS — BP 140/86 | HR 84 | Resp 16 | Ht 64.0 in | Wt 126.0 lb

## 2018-10-06 DIAGNOSIS — F17219 Nicotine dependence, cigarettes, with unspecified nicotine-induced disorders: Secondary | ICD-10-CM

## 2018-10-06 DIAGNOSIS — R911 Solitary pulmonary nodule: Secondary | ICD-10-CM | POA: Diagnosis not present

## 2018-10-06 DIAGNOSIS — R0602 Shortness of breath: Secondary | ICD-10-CM | POA: Diagnosis not present

## 2018-10-06 DIAGNOSIS — I10 Essential (primary) hypertension: Secondary | ICD-10-CM

## 2018-10-06 DIAGNOSIS — J449 Chronic obstructive pulmonary disease, unspecified: Secondary | ICD-10-CM
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
## 2018-10-20 ENCOUNTER — Telehealth: Payer: Self-pay

## 2018-10-20 DIAGNOSIS — I2510 Atherosclerotic heart disease of native coronary artery without angina pectoris: Secondary | ICD-10-CM | POA: Diagnosis not present

## 2018-10-20 DIAGNOSIS — Z720 Tobacco use: Secondary | ICD-10-CM | POA: Diagnosis not present

## 2018-10-20 DIAGNOSIS — R05 Cough: Secondary | ICD-10-CM | POA: Diagnosis not present

## 2018-10-20 DIAGNOSIS — I482 Chronic atrial fibrillation, unspecified: Secondary | ICD-10-CM | POA: Diagnosis not present

## 2018-10-20 NOTE — Telephone Encounter (Signed)
Pt called that cardiologist gave her xarelto she is unable to take I advised her she need to call cardilogist office and leave message for nurse and also heather opinion was to call cardiology

## 2018-10-28 ENCOUNTER — Ambulatory Visit (INDEPENDENT_AMBULATORY_CARE_PROVIDER_SITE_OTHER): Payer: Medicare Other | Admitting: Adult Health

## 2018-10-28 ENCOUNTER — Encounter: Payer: Self-pay | Admitting: Adult Health

## 2018-10-28 VITALS — BP 126/70 | HR 69 | Temp 98.3°F | Resp 16 | Ht 64.0 in | Wt 123.0 lb

## 2018-10-28 DIAGNOSIS — E782 Mixed hyperlipidemia: Secondary | ICD-10-CM | POA: Diagnosis not present

## 2018-10-28 DIAGNOSIS — R059 Cough, unspecified: Secondary | ICD-10-CM

## 2018-10-28 DIAGNOSIS — I10 Essential (primary) hypertension: Secondary | ICD-10-CM | POA: Diagnosis not present

## 2018-10-28 DIAGNOSIS — R05 Cough: Secondary | ICD-10-CM

## 2018-10-28 DIAGNOSIS — J988 Other specified respiratory disorders: Secondary | ICD-10-CM | POA: Diagnosis not present

## 2018-10-28 MED ORDER — GUAIFENESIN-DM 100-10 MG/5ML PO SYRP: 5.0000 mL | ORAL_SOLUTION | ORAL | 0 refills | Status: DC | PRN

## 2018-10-28 MED ORDER — AZITHROMYCIN 250 MG PO TABS: ORAL_TABLET | ORAL | 0 refills | Status: DC

## 2018-10-28 NOTE — Progress Notes (Signed)
[facility] [place], [place] [address]  Internal MEDICINE  Office Visit Note  Patient Name: [name]  [id number]  [id number]  Date of Service: 10/28/2018  Chief Complaint  Patient presents with  . Hyperlipidemia  . Hypertension  . Sinusitis    cough, congestion, was tested for flu last week it was negative, bad coughing spells at night     HPI  Pt is here for follow up on HLD, and HTN.  She is also reporting a weeks worth of cough/congestion. She reports she was tested for flu at her cardiologist office, and was negative. She reports her temperature last week was 107, however I can not confirm that at this time.  She is afebrile at this visit.  Her hypertension appears well controlled at this time.  Her most recent lipid panel from this past October is within normal limits.   Current Medication: Outpatient Encounter Medications as of 10/28/2018  Medication Sig  . albuterol (PROVENTIL HFA;VENTOLIN HFA) 108 (90 Base) MCG/ACT inhaler Inhale 2 puffs into the lungs every 6 (six) hours as needed for wheezing or shortness of breath.  Marland Kitchen aspirin EC 81 MG EC tablet Take 1 tablet (81 mg total) by mouth daily.  Marland Kitchen atorvastatin (LIPITOR) 80 MG tablet Take 1 tablet (80 mg total) by mouth every morning.  . budesonide-formoterol (SYMBICORT) 80-4.5 MCG/ACT inhaler Inhale 2 puffs into the lungs daily.  . cetirizine (ZYRTEC) 10 MG tablet Take 10 mg by mouth daily.  . cholecalciferol (VITAMIN D) 400 units TABS tablet Take 400 Units by mouth.  . ezetimibe (ZETIA) 10 MG tablet Take 10 mg by mouth daily.  . ferrous sulfate 325 (65 FE) MG tablet Take 325 mg by mouth daily.  Marland Kitchen azithromycin (ZITHROMAX) 250 MG tablet Take as directed.  Marland Kitchen guaiFENesin-dextromethorphan (ROBITUSSIN DM) 100-10 MG/5ML syrup Take 5 mLs by mouth every 4 (four) hours as needed for cough.  . [DISCONTINUED] ELIQUIS 5 MG TABS tablet Take 5 mg by mouth 2 (two) times daily.   No facility-administered  encounter medications on file as of 10/28/2018.     Surgical History: Past Surgical History:  Procedure Laterality Date  . ABDOMINAL HYSTERECTOMY    . APPENDECTOMY    . CARDIAC CATHETERIZATION    . CARDIAC SURGERY    . CHOLECYSTECTOMY    . COLONOSCOPY  12/15/11   OH->bleeding internal hemorrhoids, otherwise normal  . COLONOSCOPY WITH PROPOFOL N/A 04/12/2015   Procedure: COLONOSCOPY WITH PROPOFOL;  Surgeon: Lucilla Lame, MD;  Location: [facility] END[facility];  Service: Endoscopy;  Laterality: N/A;  . ENDARTERECTOMY FEMORAL Left 04/26/2015   Procedure: ENDARTERECTOMY FEMORAL;  Surgeon: Algernon Huxley, MD;  Location: [facility] ORS;  Service: Vascular;  Laterality: Left;  . ENDOVASCULAR STENT INSERTION Bilateral    Legs  . FLEXIBLE BRONCHOSCOPY N/A 02/02/2015   Procedure: FLEXIBLE BRONCHOSCOPY;  Surgeon: Allyne Gee, MD;  Location: [facility] ORS;  Service: Pulmonary;  Laterality: N/A;  . HEMORRHOID SURGERY    . PERIPHERAL VASCULAR CATHETERIZATION Left 04/25/2015   Procedure: Lower Extremity Angiography;  Surgeon: Algernon Huxley, MD;  Location: [place] [facility] LAB;  Service: Cardiovascular;  Laterality: Left;  . PERIPHERAL VASCULAR CATHETERIZATION Left 04/25/2015   Procedure: Lower Extremity Intervention;  Surgeon: Algernon Huxley, MD;  Location: [place] [facility] LAB;  Service: Cardiovascular;  Laterality: Left;  . PERIPHERAL VASCULAR CATHETERIZATION Left 01/02/2016   Procedure: Lower Extremity Angiography;  Surgeon: Algernon Huxley, MD;  Location: [place] CV LAB;  Service: Cardiovascular;  Laterality: Left;  . PERIPHERAL VASCULAR CATHETERIZATION  01/02/2016   Procedure: Lower Extremity Intervention;  Surgeon: Algernon Huxley, MD;  Location: [place] [facility] LAB;  Service: Cardiovascular;;  . THROMBECTOMY FEMORAL ARTERY Left 04/26/2015   Procedure: THROMBECTOMY FEMORAL ARTERY;  Surgeon: Algernon Huxley, MD;  Location: [facility] ORS;  Service: Vascular;  Laterality: Left;    Medical History: Past Medical History:  Diagnosis Date  .  Allergy    Environmental  . Arthritis   . Asthma   . Carotid artery stenosis   . Carotid stenosis   . COPD (chronic obstructive pulmonary disease) ([place])   . Coronary artery disease   . Coronary artery disease   . Hemorrhoids   . Hyperlipidemia   . Hypertension   . 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[facility])    negative cardiac cath  . Nicotine addiction   . Peripheral vascular disease ([place])   . Pneumonia   . Shortness of breath dyspnea   . Tobacco abuse     Family History: Family History  Problem Relation Age of Onset  . Hypertension Mother   . Hypertension Father     Social History   Socioeconomic History  . Marital status: Single    Spouse name: Not on file  . Number of children: Not on file  . Years of education: Not on file  . Highest education level: Not on file  Occupational History  . Not on file  Social Needs  . Financial resource strain: Not hard at all  . Food insecurity:    Worry: Never true    Inability: Never true  . Transportation needs:    Medical: No    Non-medical: No  Tobacco Use  . Smoking status: Current Every Day Smoker    Packs/day: 1.00    Years: 41.00    Pack years: 41.00    Types: Cigarettes  . Smokeless tobacco: Never Used  . Tobacco comment: pt recommend to stop smoking 4 min spent will start on prn nicotine replacment  Substance and Sexual Activity  . Alcohol use: Yes    Alcohol/week: 0.0 standard drinks    Comment: weekends, couple beers, pint of liquer only on weekends  . Drug use: No  . Sexual activity: Not Currently  Lifestyle  . Physical activity:    Days per week: 0 days    Minutes per session: 0 min  . Stress: Only a little  Relationships  . Social connections:    Talks on phone: Once a week    Gets together: Once a week    Attends religious service: 1 to 4 times per year    Active member of club or organization: No    Attends meetings of clubs or organizations: Never    Relationship status: Never married  . Intimate partner  violence:    Fear of current or ex partner: No    Emotionally abused: No    Physically abused: No    Forced sexual activity: No  Other Topics Concern  . Not on file  Social History Narrative   Lives with family       Review of Systems  Constitutional: Negative for chills, fatigue and unexpected weight change.  HENT: Negative for congestion, rhinorrhea, sneezing and sore throat.   Eyes: Negative for photophobia, pain and redness.  Respiratory: Negative for cough, chest tightness and shortness of breath.   Cardiovascular: Negative for chest pain and palpitations.  Gastrointestinal: Negative for abdominal pain, constipation, diarrhea, nausea and vomiting.  Endocrine: Negative.  Genitourinary: Negative for dysuria and frequency.  Musculoskeletal: Negative for arthralgias, back pain, joint swelling and neck pain.  Skin: Negative for rash.  Allergic/Immunologic: Negative.   Neurological: Negative for tremors and numbness.  Hematological: Negative for adenopathy. Does not bruise/bleed easily.  Psychiatric/Behavioral: Negative for behavioral problems and sleep disturbance. The patient is not nervous/anxious.     Vital Signs: BP 126/70   Pulse 69   Temp 98.3 F (36.8 C) (Oral)   Resp 16   Ht 5\' 4"  (1.626 m)   Wt 123 lb (55.8 kg)   LMP  (LMP Unknown)   SpO2 96%   BMI 21.11 kg/m    Physical Exam Vitals signs and nursing note reviewed.  Constitutional:      General: She is not in acute distress.    Appearance: She is well-developed. She is not diaphoretic.  HENT:     Head: Normocephalic and atraumatic.     Mouth/Throat:     Pharynx: No oropharyngeal exudate.  Eyes:     Pupils: Pupils are equal, round, and reactive to light.  Neck:     Musculoskeletal: Normal range of motion and neck supple.     Thyroid: No thyromegaly.     Vascular: No JVD.     Trachea: No tracheal deviation.  Cardiovascular:     Rate and Rhythm: Normal rate and regular rhythm.     Heart sounds:  Normal heart sounds. No murmur. No friction rub. No gallop.   Pulmonary:     Effort: Pulmonary effort is normal. No respiratory distress.     Breath sounds: Normal breath sounds. No wheezing or rales.  Chest:     Chest wall: No tenderness.  Abdominal:     Palpations: Abdomen is soft.     Tenderness: There is no abdominal tenderness. There is no guarding.  Musculoskeletal: Normal range of motion.  Lymphadenopathy:     Cervical: No cervical adenopathy.  Skin:    General: Skin is warm and dry.  Neurological:     Mental Status: She is alert and oriented to person, place, and time.     Cranial Nerves: No cranial nerve deficit.  Psychiatric:        Behavior: Behavior normal.        Thought Content: Thought content normal.        Judgment: Judgment normal.    Assessment/Plan: 1. Respiratory infection Patient provided with prescription for azithromycin.  Encouraged to take medication as prescribed until completed.  Return to clinic in 7 to 10 days if symptoms fail to improve. - azithromycin (ZITHROMAX) 250 MG tablet; Take as directed.  Dispense: 6 tablet; Refill: 0  2. Cough Provided patient with prescription for Robitussin-DM.  Encouraged her to take it every 4 hours as needed for cough. - guaiFENesin-dextromethorphan (ROBITUSSIN DM) 100-10 MG/5ML syrup; Take 5 mLs by mouth every 4 (four) hours as needed for cough.  Dispense: 118 mL; Refill: 0  3. Essential hypertension Stable continue current medications as prescribed.  4. Mixed hyperlipidemia Stable, continue current medication as prescribed.  General Counseling: Jaclyne verbalizes understanding of the findings of todays visit and agrees with plan of treatment. I have discussed any further diagnostic evaluation that may be needed or ordered today. We also reviewed her medications today. she has been encouraged to call the office with any questions or concerns that should arise related to todays visit.    No orders of the defined  types were placed in this encounter.   Meds ordered this encounter  Medications  . azithromycin (ZITHROMAX) 250 MG tablet    Sig: Take as directed.    Dispense:  6 tablet    Refill:  0  . guaiFENesin-dextromethorphan (ROBITUSSIN DM) 100-10 MG/5ML syrup    Sig: Take 5 mLs by mouth every 4 (four) hours as needed for cough.    Dispense:  118 mL    Refill:  0    Time spent: 25 Minutes   This patient was seen by Orson Gear AGNP-C in Collaboration with Dr Lavera Guise as a part of collaborative care agreement     Kendell Bane AGNP-C Internal medicine

## 2018-10-28 NOTE — Patient Instructions (Signed)

## 2018-10-29 ENCOUNTER — Emergency Department
Admission: EM | Admit: 2018-10-29 | Discharge: 2018-10-29 | Disposition: A | Discharge status: Home / Self Care | Payer: Medicare Other | Attending: Emergency Medicine | Admitting: Emergency Medicine

## 2018-10-29 ENCOUNTER — Encounter: Payer: Self-pay | Admitting: Emergency Medicine

## 2018-10-29 ENCOUNTER — Other Ambulatory Visit: Payer: Self-pay

## 2018-10-29 ENCOUNTER — Emergency Department: Payer: Medicare Other

## 2018-10-29 DIAGNOSIS — R05 Cough: Secondary | ICD-10-CM | POA: Diagnosis not present

## 2018-10-29 DIAGNOSIS — J449 Chronic obstructive pulmonary disease, unspecified: Secondary | ICD-10-CM | POA: Insufficient documentation

## 2018-10-29 DIAGNOSIS — I259 Chronic ischemic heart disease, unspecified: Secondary | ICD-10-CM | POA: Insufficient documentation

## 2018-10-29 DIAGNOSIS — J439 Emphysema, unspecified: Secondary | ICD-10-CM | POA: Diagnosis not present

## 2018-10-29 DIAGNOSIS — K8500 Idiopathic acute pancreatitis without necrosis or infection: Secondary | ICD-10-CM | POA: Diagnosis not present

## 2018-10-29 DIAGNOSIS — I10 Essential (primary) hypertension: Secondary | ICD-10-CM | POA: Diagnosis not present

## 2018-10-29 DIAGNOSIS — R1010 Upper abdominal pain, unspecified: Secondary | ICD-10-CM | POA: Diagnosis not present

## 2018-10-29 DIAGNOSIS — R1084 Generalized abdominal pain: Secondary | ICD-10-CM | POA: Diagnosis not present

## 2018-10-29 DIAGNOSIS — F17210 Nicotine dependence, cigarettes, uncomplicated: Secondary | ICD-10-CM | POA: Insufficient documentation

## 2018-10-29 DIAGNOSIS — R109 Unspecified abdominal pain: Secondary | ICD-10-CM | POA: Diagnosis present

## 2018-10-29 LAB — CBC
HCT: 28.4 % — ABNORMAL LOW (ref 36.0–46.0)
Hemoglobin: 8.3 g/dL — ABNORMAL LOW (ref 12.0–15.0)
MCH: 31.1 pg (ref 26.0–34.0)
MCHC: 29.2 g/dL — ABNORMAL LOW (ref 30.0–36.0)
MCV: 106.4 fL — AB (ref 80.0–100.0)
Platelets: 199 10*3/uL (ref 150–400)
RBC: 2.67 MIL/uL — ABNORMAL LOW (ref 3.87–5.11)
RDW: 14.7 % (ref 11.5–15.5)
WBC: 4.1 10*3/uL (ref 4.0–10.5)
nRBC: 0 % (ref 0.0–0.2)

## 2018-10-29 LAB — COMPREHENSIVE METABOLIC PANEL
ALT: 19 U/L (ref 0–44)
AST: 23 U/L (ref 15–41)
Albumin: 3.3 g/dL — ABNORMAL LOW (ref 3.5–5.0)
Alkaline Phosphatase: 53 U/L (ref 38–126)
Anion gap: 7 (ref 5–15)
BUN: 12 mg/dL (ref 8–23)
CO2: 24 mmol/L (ref 22–32)
Calcium: 8.9 mg/dL (ref 8.9–10.3)
Chloride: 107 mmol/L (ref 98–111)
Creatinine, Ser: 0.81 mg/dL (ref 0.44–1.00)
GFR calc non Af Amer: >60 mL/min (ref >60)
Glucose, Bld: 86 mg/dL (ref 70–99)
Potassium: 4.1 mmol/L (ref 3.5–5.1)
Sodium: 138 mmol/L (ref 135–145)
Total Bilirubin: 0.5 mg/dL (ref 0.3–1.2)
Total Protein: 6.4 g/dL — ABNORMAL LOW (ref 6.5–8.1)

## 2018-10-29 LAB — LIPASE, BLOOD: Lipase: 74 U/L — ABNORMAL HIGH (ref 11–51)

## 2018-10-29 MED ORDER — IPRATROPIUM-ALBUTEROL 0.5-2.5 (3) MG/3ML IN SOLN
3.0000 mL | Freq: Once | RESPIRATORY_TRACT | Status: AC
  Administered 2018-10-29: 3 mL via RESPIRATORY_TRACT

## 2018-10-29 MED ORDER — PANTOPRAZOLE SODIUM 40 MG PO TBEC: 40.0000 mg | DELAYED_RELEASE_TABLET | Freq: Every day | ORAL | 1 refills | Status: DC

## 2018-10-29 MED ORDER — IPRATROPIUM-ALBUTEROL 0.5-2.5 (3) MG/3ML IN SOLN
RESPIRATORY_TRACT | Status: AC
  Administered 2018-10-29: 3 mL via RESPIRATORY_TRACT
  Filled 2018-10-29: qty 3

## 2018-10-29 MED ORDER — OXYCODONE-ACETAMINOPHEN 5-325 MG PO TABS: 1.0000 | ORAL_TABLET | Freq: Three times a day (TID) | ORAL | 0 refills | Status: DC | PRN

## 2018-10-29 MED ORDER — IOHEXOL 300 MG/ML  SOLN
75.0000 mL | Freq: Once | INTRAMUSCULAR | Status: AC | PRN
  Administered 2018-10-29: 75 mL via INTRAVENOUS
  Filled 2018-10-29: qty 75
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
## 2018-10-29 NOTE — ED Triage Notes (Signed)
Pt to ED from home via EMS c/o upper left and right abd pain for a couple days feels like knots intermittently, worse with coughing.  Denies n/v/d.  States stools are dark d/t iron pills and has not noticed changes in stool. Pt A&Ox4, speaking in complete and coherent sentences, appears in NAD at this time.

## 2018-10-29 NOTE — ED Notes (Signed)
First nurse note: pt presents to ED via [facility] from home c/o bil side pain when coughing. EMS reports pt seen by PCP yesterday and given Z pack that she has been taking as prescribed.

## 2018-10-29 NOTE — ED Notes (Signed)
Patient transported to X-ray 

## 2018-10-29 NOTE — ED Notes (Signed)
Pt alert and oriented X4, active, cooperative, pt in NAD. RR even and unlabored, color WNL.  Pt informed to return if any life threatening symptoms occur.  Discharge and followup instructions reviewed. Ambulates safely. 

## 2018-10-29 NOTE — ED Notes (Signed)
Patient transported to CT 

## 2019-01-01 ENCOUNTER — Other Ambulatory Visit: Payer: Self-pay

## 2019-01-01 DIAGNOSIS — E782 Mixed hyperlipidemia: Secondary | ICD-10-CM

## 2019-01-01 MED ORDER — ATORVASTATIN CALCIUM 80 MG PO TABS: 80.0000 mg | ORAL_TABLET | ORAL | 5 refills | Status: DC

## 2019-01-06 DIAGNOSIS — I340 Nonrheumatic mitral (valve) insufficiency: Secondary | ICD-10-CM | POA: Diagnosis not present

## 2019-01-06 DIAGNOSIS — I7389 Other specified peripheral vascular diseases: Secondary | ICD-10-CM | POA: Diagnosis not present

## 2019-01-06 DIAGNOSIS — I10 Essential (primary) hypertension: Secondary | ICD-10-CM | POA: Diagnosis not present

## 2019-01-06 DIAGNOSIS — R0602 Shortness of breath: Secondary | ICD-10-CM | POA: Diagnosis not present

## 2019-02-06 DIAGNOSIS — I7389 Other specified peripheral vascular diseases: Secondary | ICD-10-CM | POA: Diagnosis not present

## 2019-02-10 DIAGNOSIS — I739 Peripheral vascular disease, unspecified: Secondary | ICD-10-CM | POA: Diagnosis not present

## 2019-02-10 DIAGNOSIS — I2510 Atherosclerotic heart disease of native coronary artery without angina pectoris: Secondary | ICD-10-CM | POA: Diagnosis not present

## 2019-02-10 DIAGNOSIS — R0602 Shortness of breath: Secondary | ICD-10-CM | POA: Diagnosis not present

## 2019-02-10 DIAGNOSIS — I340 Nonrheumatic mitral (valve) insufficiency: Secondary | ICD-10-CM | POA: Diagnosis not present

## 2019-04-09 ENCOUNTER — Encounter: Payer: Self-pay | Admitting: Internal Medicine

## 2019-04-09 ENCOUNTER — Other Ambulatory Visit: Payer: Self-pay

## 2019-04-09 ENCOUNTER — Ambulatory Visit (INDEPENDENT_AMBULATORY_CARE_PROVIDER_SITE_OTHER): Payer: Medicare Other | Admitting: Internal Medicine

## 2019-04-09 VITALS — BP 124/82 | HR 80 | Resp 16 | Ht 64.0 in | Wt 126.0 lb

## 2019-04-09 DIAGNOSIS — I10 Essential (primary) hypertension: Secondary | ICD-10-CM | POA: Diagnosis not present

## 2019-04-09 DIAGNOSIS — J449 Chronic obstructive pulmonary disease, unspecified: Secondary | ICD-10-CM | POA: Diagnosis not present

## 2019-04-09 DIAGNOSIS — R0602 Shortness of breath: Secondary | ICD-10-CM

## 2019-04-09 DIAGNOSIS — R911 Solitary pulmonary nodule: Secondary | ICD-10-CM | POA: Diagnosis not present

## 2019-04-09 DIAGNOSIS — F17219 Nicotine dependence, cigarettes, with unspecified nicotine-induced disorders: Secondary | ICD-10-CM
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
## 2019-04-20 ENCOUNTER — Ambulatory Visit
Admission: RE | Admit: 2019-04-20 | Discharge: 2019-04-20 | Disposition: A | Discharge status: Home / Self Care | Payer: Medicare Other | Source: Ambulatory Visit | Attending: Adult Health | Admitting: Adult Health

## 2019-04-20 ENCOUNTER — Other Ambulatory Visit: Payer: Self-pay

## 2019-04-20 DIAGNOSIS — R0602 Shortness of breath: Secondary | ICD-10-CM | POA: Diagnosis not present

## 2019-04-20 DIAGNOSIS — R911 Solitary pulmonary nodule: Secondary | ICD-10-CM

## 2019-04-23 DIAGNOSIS — Z720 Tobacco use: Secondary | ICD-10-CM | POA: Diagnosis not present

## 2019-04-23 DIAGNOSIS — I10 Essential (primary) hypertension: Secondary | ICD-10-CM | POA: Diagnosis not present

## 2019-04-23 DIAGNOSIS — I2510 Atherosclerotic heart disease of native coronary artery without angina pectoris: Secondary | ICD-10-CM | POA: Diagnosis not present

## 2019-04-23 DIAGNOSIS — I482 Chronic atrial fibrillation, unspecified: Secondary | ICD-10-CM | POA: Diagnosis not present

## 2019-05-15 ENCOUNTER — Encounter (INDEPENDENT_AMBULATORY_CARE_PROVIDER_SITE_OTHER): Payer: Medicare Other

## 2019-05-15 ENCOUNTER — Ambulatory Visit (INDEPENDENT_AMBULATORY_CARE_PROVIDER_SITE_OTHER): Payer: Medicare Other | Admitting: Vascular Surgery

## 2019-05-19 ENCOUNTER — Encounter (INDEPENDENT_AMBULATORY_CARE_PROVIDER_SITE_OTHER): Payer: Medicare Other

## 2019-05-19 ENCOUNTER — Ambulatory Visit (INDEPENDENT_AMBULATORY_CARE_PROVIDER_SITE_OTHER): Payer: Medicare Other | Admitting: Vascular Surgery

## 2019-06-10 ENCOUNTER — Other Ambulatory Visit (INDEPENDENT_AMBULATORY_CARE_PROVIDER_SITE_OTHER): Payer: Self-pay | Admitting: Vascular Surgery

## 2019-06-10 DIAGNOSIS — I6523 Occlusion and stenosis of bilateral carotid arteries: Secondary | ICD-10-CM

## 2019-06-12 ENCOUNTER — Ambulatory Visit (INDEPENDENT_AMBULATORY_CARE_PROVIDER_SITE_OTHER): Payer: Medicare Other

## 2019-06-12 ENCOUNTER — Other Ambulatory Visit: Payer: Self-pay

## 2019-06-12 ENCOUNTER — Ambulatory Visit (INDEPENDENT_AMBULATORY_CARE_PROVIDER_SITE_OTHER): Payer: Medicare Other | Admitting: Vascular Surgery

## 2019-06-12 ENCOUNTER — Encounter (INDEPENDENT_AMBULATORY_CARE_PROVIDER_SITE_OTHER): Payer: Self-pay | Admitting: Vascular Surgery

## 2019-06-12 VITALS — BP 124/72 | HR 64 | Resp 12 | Ht 64.0 in | Wt 123.0 lb

## 2019-06-12 DIAGNOSIS — F17200 Nicotine dependence, unspecified, uncomplicated: Secondary | ICD-10-CM

## 2019-06-12 DIAGNOSIS — I6529 Occlusion and stenosis of unspecified carotid artery: Secondary | ICD-10-CM | POA: Insufficient documentation

## 2019-06-12 DIAGNOSIS — I10 Essential (primary) hypertension: Secondary | ICD-10-CM | POA: Diagnosis not present

## 2019-06-12 DIAGNOSIS — I6523 Occlusion and stenosis of bilateral carotid arteries: Secondary | ICD-10-CM | POA: Diagnosis not present

## 2019-06-12 DIAGNOSIS — I70213 Atherosclerosis of native arteries of extremities with intermittent claudication, bilateral legs: Secondary | ICD-10-CM | POA: Diagnosis not present

## 2019-06-12 NOTE — Assessment & Plan Note (Signed)
We again discussed for several minutes how harmful tobacco was to her vascular system.  She is well aware of this but has been unable to quit smoking and does not really have much interest in smoking cessation at this time.  We discussed several options available for smoking cessation and how important it will be for her multiple areas of vascular disease.

## 2019-06-12 NOTE — Assessment & Plan Note (Signed)
Her carotid duplex today shows velocities just into the 40 to 59% range bilaterally. Continue current medical regimen.  No role for intervention with this mild to moderate disease.  Recheck in 1 year.

## 2019-06-12 NOTE — Assessment & Plan Note (Signed)
Her claudication symptoms on the left leg have significantly worsened.  She is undergone multiple previous revascularizations including femoral endarterectomy.  Both legs have had treatment and she does have symptoms in both legs.  The progression of her disease is likely related to continued tobacco use and other risk factors.  We will go ahead get some noninvasive studies in the near future at her convenience earlier than her scheduled follow-up visit.  We discussed the pathophysiology and natural history of peripheral arterial disease and recommended smoking cessation once more.

## 2019-06-12 NOTE — Progress Notes (Signed)
MRN : [id number]  [name] is a 63 y.o. (06-26-1956) female who presents with chief complaint of  Chief Complaint  Patient presents with  . Follow-up  .  History of Present Illness: Patient returns today in follow-up of her carotid disease but she is complaining more about her leg pain.  She has not had any focal neurologic symptoms of cerebrovascular ischemia. Specifically, the patient denies amaurosis fugax, speech or swallowing difficulties, or arm or leg weakness or numbness.  Her carotid duplex today shows velocities just into the 40 to 59% range bilaterally. Her biggest issue currently is of left leg claudication.  She has had this on many times in the past is required multiple previous interventions as well as open surgical therapy.  She does continue to smoke and understands this is harmful to her vascular system.  She does not have any open sores or ulcers but she does now have pain that bothers her at night.  She reports the left leg to be the more severely affected of the 2 legs.  The symptoms have gotten gradually worse over several months.  Nothing has really made it better.  Current Outpatient Medications  Medication Sig Dispense Refill  . albuterol (PROVENTIL HFA;VENTOLIN HFA) 108 (90 Base) MCG/ACT inhaler Inhale 2 puffs into the lungs every 6 (six) hours as needed for wheezing or shortness of breath. 1 Inhaler 5  . aspirin EC 81 MG EC tablet Take 1 tablet (81 mg total) by mouth daily. 90 tablet 1  . atorvastatin (LIPITOR) 80 MG tablet Take 1 tablet (80 mg total) by mouth every morning. 30 tablet 5  . budesonide-formoterol (SYMBICORT) 80-4.5 MCG/ACT inhaler Inhale 2 puffs into the lungs daily. 1 Inhaler 5  . cetirizine (ZYRTEC) 10 MG tablet Take 10 mg by mouth daily.    . cholecalciferol (VITAMIN D) 400 units TABS tablet Take 400 Units by mouth.    . ferrous sulfate 325 (65 FE) MG tablet Take 325 mg by mouth daily.    Marland Kitchen ezetimibe (ZETIA) 10 MG tablet Take 10 mg by mouth  daily.  0   No current facility-administered medications for this visit.     Past Medical History:  Diagnosis Date  . Allergy    Environmental  . Arthritis   . Asthma   . Carotid artery stenosis   . Carotid stenosis   . COPD (chronic obstructive pulmonary disease) ([place])   . Coronary artery disease   . Coronary artery disease   . Hemorrhoids   . Hyperlipidemia   . Hypertension   . 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[facility])    negative cardiac cath  . Nicotine addiction   . Peripheral vascular disease ([place])   . Pneumonia   . Shortness of breath dyspnea   . Tobacco abuse     Past Surgical History:  Procedure Laterality Date  . ABDOMINAL HYSTERECTOMY    . APPENDECTOMY    . CARDIAC CATHETERIZATION    . CARDIAC SURGERY    . CHOLECYSTECTOMY    . COLONOSCOPY  12/15/11   OH->bleeding internal hemorrhoids, otherwise normal  . COLONOSCOPY WITH PROPOFOL N/A 04/12/2015   Procedure: COLONOSCOPY WITH PROPOFOL;  Surgeon: Lucilla Lame, MD;  Location: [facility];  Service: Endoscopy;  Laterality: N/A;  . ENDARTERECTOMY FEMORAL Left 04/26/2015   Procedure: ENDARTERECTOMY FEMORAL;  Surgeon: Algernon Huxley, MD;  Location: [facility] OR[facility];  Service: Vascular;  Laterality: Left;  . ENDOVASCULAR STENT INSERTION Bilateral    Legs  . FLEXIBLE BRONCHOSCOPY N/A 02/02/2015  Procedure: FLEXIBLE BRONCHOSCOPY;  Surgeon: Allyne Gee, MD;  Location: [facility];  Service: Pulmonary;  Laterality: N/A;  . HEMORRHOID SURGERY    . PERIPHERAL VASCULAR CATHETERIZATION Left 04/25/2015   Procedure: Lower Extremity Angiography;  Surgeon: Algernon Huxley, MD;  Location: [place] [facility];  Service: Cardiovascular;  Laterality: Left;  . PERIPHERAL VASCULAR CATHETERIZATION Left 04/25/2015   Procedure: Lower Extremity Intervention;  Surgeon: Algernon Huxley, MD;  Location: [place] [facility];  Service: Cardiovascular;  Laterality: Left;  . PERIPHERAL VASCULAR CATHETERIZATION Left 01/02/2016   Procedure: Lower Extremity Angiography;  Surgeon: Algernon Huxley, MD;  Location: [place] [facility];  Service: Cardiovascular;  Laterality: Left;  . PERIPHERAL VASCULAR CATHETERIZATION  01/02/2016   Procedure: Lower Extremity Intervention;  Surgeon: Algernon Huxley, MD;  Location: [place] [facility];  Service: Cardiovascular;;  . THROMBECTOMY FEMORAL ARTERY Left 04/26/2015   Procedure: THROMBECTOMY FEMORAL ARTERY;  Surgeon: Algernon Huxley, MD;  Location: [facility];  Service: Vascular;  Laterality: Left;    Social History   Tobacco Use  . Smoking status: Current Every Day Smoker    Packs/day: 1.00    Years: 41.00    Pack years: 41.00    Types: Cigarettes  . Smokeless tobacco: Never Used  . Tobacco comment: pt recommend to stop smoking 4 min spent will start on prn nicotine replacment  Substance Use Topics  . Alcohol use: Yes    Alcohol/week: 0.0 standard drinks    Comment: weekends, couple beers, pint of liquer only on weekends.  Last drank approx 1 month ago.  . Drug use: No    Family History  Problem Relation Age of Onset  . Hypertension Mother   . Hypertension Father   no bleeding or clotting disorders  Allergies  Allergen Reactions  . Aspirin Nausea And Vomiting and Other (See Comments)    Pt states aspirin makes her cramp and have to use coated kind.  . Tramadol   . Zyrtec [Cetirizine]     REVIEW OF SYSTEMS(Negative unless checked)  Constitutional: [] ?Weight loss[] ?Fever[] ?Chills Cardiac:[] ?Chest pain[] ?Chest pressure[] ?Palpitations [] ?Shortness of breath when laying flat [] ?Shortness of breath at rest [x] ?Shortness of breath with exertion. Vascular: [x] ?Pain in legs with walking[] ?Pain in legsat rest[] ?Pain in legs when laying flat [x] ?Claudication [] ?Pain in feet when walking [] ?Pain in feet at rest [] ?Pain in feet when laying flat [] ?History of DVT [] ?Phlebitis [x] ?Swelling in legs [] ?Varicose veins [] ?Non-healing ulcers Pulmonary: [] ?Uses home oxygen [] ?Productive cough[] ?Hemoptysis  [] ?Wheeze [] ?COPD [] ?Asthma Neurologic: [] ?Dizziness [] ?Blackouts [] ?Seizures [] ?History of stroke [] ?History of TIA[] ?Aphasia [] ?Temporary blindness[] ?Dysphagia [] ?Weaknessor numbness in arms [] ?Weakness or numbnessin legs Musculoskeletal: [] ?Arthritis [] ?Joint swelling [] ?Joint pain [] ?Low back pain Hematologic:[] ?Easy bruising[] ?Easy bleeding [] ?Hypercoagulable state [] ?Anemic  Gastrointestinal:[] ?Blood in stool[] ?Vomiting blood[] ?Gastroesophageal reflux/heartburn[] ?Abdominal pain Genitourinary: [] ?Chronic kidney disease [] ?Difficulturination [] ?Frequenturination [] ?Burning with urination[] ?Hematuria Skin: [] ?Rashes [] ?Ulcers [] ?Wounds Psychological: [] ?History of anxiety[] ?History of major depression.  Physical Examination  Vitals:   06/12/19 0855  BP: 124/72  Pulse: 64  Resp: 12  Weight: 123 lb (55.8 kg)  Height: 5\' 4"  (1.626 m)   Body mass index is 21.11 kg/m. Gen:  WD/WN, NAD Head: [place]/AT, No temporalis wasting. Ear/Nose/Throat: Hearing grossly intact, nares w/o erythema or drainage, trachea midline Eyes: Conjunctiva clear. Sclera non-icteric Neck: Supple.  No carotid bruit  Pulmonary:  Good air movement, equal and clear to auscultation bilaterally.  Cardiac: RRR, No JVD Vascular:  Vessel Right Left  Radial Palpable Palpable  PT 1+ Palpable 1+Palpable  DP 1+ Palpable Not Palpable    Musculoskeletal: M/S 5/5 throughout.  No deformity or atrophy. Trace LE edema. Neurologic: CN 2-12 intact. Sensation grossly intact in extremities.  Symmetrical.  Speech is fluent. Motor exam as listed above. Psychiatric: Judgment intact, Mood & affect appropriate for pt's clinical situation. Dermatologic: No rashes or ulcers noted.  No cellulitis or open wounds.      CBC Lab Results  Component Value Date   WBC 4.1 10/29/2018   HGB 8.3 (L) 10/29/2018   HCT 28.4 (L) 10/29/2018   MCV  106.4 (H) 10/29/2018   PLT 199 10/29/2018    BMET    Component Value Date/Time   NA 138 10/29/2018 1318   NA 139 11/11/2017 1123   NA 131 (L) 10/15/2013 1123   K 4.1 10/29/2018 1318   K 3.6 10/15/2013 1123   CL 107 10/29/2018 1318   CL 102 10/15/2013 1123   CO2 24 10/29/2018 1318   CO2 24 10/15/2013 1123   GLUCOSE 86 10/29/2018 1318   GLUCOSE 111 (H) 10/15/2013 1123   BUN 12 10/29/2018 1318   BUN 25 11/11/2017 1123   BUN 12 10/15/2013 1123   CREATININE 0.81 10/29/2018 1318   CREATININE 1.20 10/15/2013 1123   CALCIUM 8.9 10/29/2018 1318   CALCIUM 9.1 10/15/2013 1123   GFRNONAA >60 10/29/2018 1318   GFRNONAA 50 (L) 10/15/2013 1123   GFRAA >60 10/29/2018 1318   GFRAA 58 (L) 10/15/2013 1123   CrCl cannot be calculated (Patient's most recent lab result is older than the maximum 21 days allowed.).  COAG Lab Results  Component Value Date   INR 0.94 04/25/2015    Radiology No results found.   Assessment/Plan Essential hypertension blood pressure control important in reducing the progression of atherosclerotic disease. On appropriate oral medications.   Carotid stenosis Her carotid duplex today shows velocities just into the 40 to 59% range bilaterally. Continue current medical regimen.  No role for intervention with this mild to moderate disease.  Recheck in 1 year.  Tobacco use disorder We again discussed for several minutes how harmful tobacco was to her vascular system.  She is well aware of this but has been unable to quit smoking and does not really have much interest in smoking cessation at this time.  We discussed several options available for smoking cessation and how important it will be for her multiple areas of vascular disease.  Atherosclerosis of native arteries of extremity with intermittent claudication (HCC) Her claudication symptoms on the left leg have significantly worsened.  She is undergone multiple previous revascularizations including femoral  endarterectomy.  Both legs have had treatment and she does have symptoms in both legs.  The progression of her disease is likely related to continued tobacco use and other risk factors.  We will go ahead get some noninvasive studies in the near future at her convenience earlier than her scheduled follow-up visit.  We discussed the pathophysiology and natural history of peripheral arterial disease and recommended smoking cessation once more.    Leotis Pain, MD  06/12/2019 9:17 AM    This note was created with Dragon medical transcription system.  Any errors from dictation are purely unintentional

## 2019-06-12 NOTE — Patient Instructions (Signed)
Peripheral Vascular Disease  Peripheral vascular disease (PVD) is a disease of the blood vessels that are not part of your heart and brain. A simple term for PVD is poor circulation. In most cases, PVD narrows the blood vessels that carry blood from your heart to the rest of your body. This can reduce the supply of blood to your arms, legs, and internal organs, like your stomach or kidneys. However, PVD most often affects a person's lower legs and feet. Without treatment, PVD tends to get worse. PVD can also lead to acute ischemic limb. This is when an arm or leg suddenly cannot get enough blood. This is a medical emergency. Follow these instructions at home: Lifestyle  Do not use any products that contain nicotine or tobacco, such as cigarettes and e-cigarettes. If you need help quitting, ask your doctor.  Lose weight if you are overweight. Or, stay at a healthy weight as told by your doctor.  Eat a diet that is low in fat and cholesterol. If you need help, ask your doctor.  Exercise regularly. Ask your doctor for activities that are right for you. General instructions  Take over-the-counter and prescription medicines only as told by your doctor.  Take good care of your feet: ? Wear comfortable shoes that fit well. ? Check your feet often for any cuts or sores.  Keep all follow-up visits as told by your doctor This is important. Contact a doctor if:  You have cramps in your legs when you walk.  You have leg pain when you are at rest.  You have coldness in a leg or foot.  Your skin changes.  You are unable to get or have an erection (erectile dysfunction).  You have cuts or sores on your feet that do not heal. Get help right away if:  Your arm or leg turns cold, numb, and blue.  Your arms or legs become red, warm, swollen, painful, or numb.  You have chest pain.  You have trouble breathing.  You suddenly have weakness in your face, arm, or leg.  You become very  confused or you cannot speak.  You suddenly have a very bad headache.  You suddenly cannot see. Summary  Peripheral vascular disease (PVD) is a disease of the blood vessels.  A simple term for PVD is poor circulation. Without treatment, PVD tends to get worse.  Treatment may include exercise, low fat and low cholesterol diet, and quitting smoking. This information is not intended to replace advice given to you by your health care provider. Make sure you discuss any questions you have with your health care provider. Document Released: 11/14/2009 Document Revised: 08/02/2017 Document Reviewed: 09/27/2016 Elsevier Patient Education  2020 [employer]  

## 2019-06-12 NOTE — Assessment & Plan Note (Signed)
blood pressure control important in reducing the progression of atherosclerotic disease. On appropriate oral medications.  

## 2019-06-26 ENCOUNTER — Encounter (INDEPENDENT_AMBULATORY_CARE_PROVIDER_SITE_OTHER): Payer: Self-pay

## 2019-06-26 ENCOUNTER — Encounter (INDEPENDENT_AMBULATORY_CARE_PROVIDER_SITE_OTHER): Payer: Medicare Other

## 2019-06-26 ENCOUNTER — Ambulatory Visit (INDEPENDENT_AMBULATORY_CARE_PROVIDER_SITE_OTHER): Payer: Medicare Other | Admitting: Nurse Practitioner

## 2019-06-26 DIAGNOSIS — I2510 Atherosclerotic heart disease of native coronary artery without angina pectoris: Secondary | ICD-10-CM | POA: Diagnosis not present

## 2019-06-26 DIAGNOSIS — R0602 Shortness of breath: Secondary | ICD-10-CM | POA: Diagnosis not present

## 2019-06-26 DIAGNOSIS — I7389 Other specified peripheral vascular diseases: Secondary | ICD-10-CM | POA: Diagnosis not present

## 2019-06-26 DIAGNOSIS — I340 Nonrheumatic mitral (valve) insufficiency: Secondary | ICD-10-CM | POA: Diagnosis not present

## 2019-06-26 DIAGNOSIS — I10 Essential (primary) hypertension: Secondary | ICD-10-CM | POA: Diagnosis not present

## 2019-07-03 ENCOUNTER — Ambulatory Visit: Payer: Self-pay | Admitting: Nurse Practitioner

## 2019-07-16 ENCOUNTER — Telehealth: Payer: Self-pay

## 2019-07-16 DIAGNOSIS — H2513 Age-related nuclear cataract, bilateral: Secondary | ICD-10-CM | POA: Diagnosis not present

## 2019-07-16 NOTE — Telephone Encounter (Signed)
Confirmed appointment with Ebony Hail, asked to have patient call back to go over covid screening questions. klh

## 2019-07-20 ENCOUNTER — Other Ambulatory Visit: Payer: Self-pay

## 2019-07-20 ENCOUNTER — Encounter: Payer: Self-pay | Admitting: Internal Medicine

## 2019-07-20 ENCOUNTER — Ambulatory Visit (INDEPENDENT_AMBULATORY_CARE_PROVIDER_SITE_OTHER): Payer: Medicare Other | Admitting: Internal Medicine

## 2019-07-20 VITALS — BP 140/80 | HR 78 | Temp 97.0°F | Resp 16 | Ht 64.0 in | Wt 123.0 lb

## 2019-07-20 DIAGNOSIS — R911 Solitary pulmonary nodule: Secondary | ICD-10-CM

## 2019-07-20 DIAGNOSIS — F17219 Nicotine dependence, cigarettes, with unspecified nicotine-induced disorders: Secondary | ICD-10-CM

## 2019-07-20 DIAGNOSIS — R0602 Shortness of breath: Secondary | ICD-10-CM | POA: Diagnosis not present

## 2019-07-20 DIAGNOSIS — J449 Chronic obstructive pulmonary disease, unspecified: Secondary | ICD-10-CM

## 2019-07-20 DIAGNOSIS — I2510 Atherosclerotic heart disease of native coronary artery without angina pectoris: Secondary | ICD-10-CM | POA: Diagnosis not present

## 2019-07-20 DIAGNOSIS — I2583 Coronary atherosclerosis due to lipid rich plaque: Secondary | ICD-10-CM
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
## 2019-08-07 DIAGNOSIS — R0602 Shortness of breath: Secondary | ICD-10-CM | POA: Diagnosis not present

## 2019-08-07 DIAGNOSIS — I10 Essential (primary) hypertension: Secondary | ICD-10-CM | POA: Diagnosis not present

## 2019-08-07 DIAGNOSIS — I340 Nonrheumatic mitral (valve) insufficiency: Secondary | ICD-10-CM | POA: Diagnosis not present

## 2019-09-02 ENCOUNTER — Ambulatory Visit: Payer: Self-pay | Admitting: Adult Health

## 2019-09-07 ENCOUNTER — Telehealth: Payer: Self-pay

## 2019-09-07 NOTE — Telephone Encounter (Signed)
Patient cancelled appointment on 09/09/2019 will call back to reschedule. klh

## 2019-09-09 ENCOUNTER — Ambulatory Visit: Payer: Self-pay | Admitting: Adult Health

## 2019-10-08 DIAGNOSIS — R0602 Shortness of breath: Secondary | ICD-10-CM | POA: Diagnosis not present

## 2019-10-08 DIAGNOSIS — I739 Peripheral vascular disease, unspecified: Secondary | ICD-10-CM | POA: Diagnosis not present

## 2019-10-08 DIAGNOSIS — I2510 Atherosclerotic heart disease of native coronary artery without angina pectoris: Secondary | ICD-10-CM | POA: Diagnosis not present

## 2019-10-08 DIAGNOSIS — I10 Essential (primary) hypertension: Secondary | ICD-10-CM | POA: Diagnosis not present

## 2019-10-08 DIAGNOSIS — I340 Nonrheumatic mitral (valve) insufficiency: Secondary | ICD-10-CM | POA: Diagnosis not present

## 2019-10-08 DIAGNOSIS — F17210 Nicotine dependence, cigarettes, uncomplicated: Secondary | ICD-10-CM | POA: Diagnosis not present

## 2019-10-14 DIAGNOSIS — R0602 Shortness of breath: Secondary | ICD-10-CM | POA: Diagnosis not present

## 2019-10-28 DIAGNOSIS — I739 Peripheral vascular disease, unspecified: Secondary | ICD-10-CM | POA: Diagnosis not present

## 2019-10-28 DIAGNOSIS — I10 Essential (primary) hypertension: Secondary | ICD-10-CM | POA: Diagnosis not present

## 2019-10-28 DIAGNOSIS — I2510 Atherosclerotic heart disease of native coronary artery without angina pectoris: Secondary | ICD-10-CM | POA: Diagnosis not present

## 2019-10-28 DIAGNOSIS — R0602 Shortness of breath: Secondary | ICD-10-CM | POA: Diagnosis not present

## 2019-10-28 DIAGNOSIS — F17210 Nicotine dependence, cigarettes, uncomplicated: Secondary | ICD-10-CM | POA: Diagnosis not present

## 2019-11-04 DIAGNOSIS — I2510 Atherosclerotic heart disease of native coronary artery without angina pectoris: Secondary | ICD-10-CM | POA: Diagnosis not present

## 2019-11-06 DIAGNOSIS — I10 Essential (primary) hypertension: Secondary | ICD-10-CM | POA: Diagnosis not present

## 2019-11-06 DIAGNOSIS — I2510 Atherosclerotic heart disease of native coronary artery without angina pectoris: Secondary | ICD-10-CM | POA: Diagnosis not present

## 2019-11-06 DIAGNOSIS — I340 Nonrheumatic mitral (valve) insufficiency: Secondary | ICD-10-CM | POA: Diagnosis not present

## 2019-11-30 ENCOUNTER — Other Ambulatory Visit: Payer: Self-pay

## 2019-11-30 DIAGNOSIS — E782 Mixed hyperlipidemia: Secondary | ICD-10-CM

## 2019-11-30 MED ORDER — ATORVASTATIN CALCIUM 80 MG PO TABS: 80.0000 mg | ORAL_TABLET | ORAL | 0 refills | Status: DC

## 2019-12-11 ENCOUNTER — Telehealth: Payer: Self-pay

## 2019-12-11 NOTE — Telephone Encounter (Signed)
Confirmed appointment on 12/15/2019. klh

## 2019-12-15 ENCOUNTER — Ambulatory Visit (INDEPENDENT_AMBULATORY_CARE_PROVIDER_SITE_OTHER): Payer: Medicare Other | Admitting: Adult Health

## 2019-12-15 ENCOUNTER — Other Ambulatory Visit: Payer: Self-pay

## 2019-12-15 ENCOUNTER — Encounter: Payer: Self-pay | Admitting: Adult Health

## 2019-12-15 VITALS — BP 130/88 | HR 90 | Temp 96.0°F | Resp 16 | Ht 64.0 in | Wt 120.0 lb

## 2019-12-15 DIAGNOSIS — F17219 Nicotine dependence, cigarettes, with unspecified nicotine-induced disorders: Secondary | ICD-10-CM

## 2019-12-15 DIAGNOSIS — J449 Chronic obstructive pulmonary disease, unspecified: Secondary | ICD-10-CM

## 2019-12-15 DIAGNOSIS — Z1231 Encounter for screening mammogram for malignant neoplasm of breast: Secondary | ICD-10-CM

## 2019-12-15 DIAGNOSIS — E782 Mixed hyperlipidemia: Secondary | ICD-10-CM

## 2019-12-15 DIAGNOSIS — Z0001 Encounter for general adult medical examination with abnormal findings: Secondary | ICD-10-CM | POA: Diagnosis not present

## 2019-12-15 DIAGNOSIS — Z1211 Encounter for screening for malignant neoplasm of colon: Secondary | ICD-10-CM | POA: Diagnosis not present

## 2019-12-15 DIAGNOSIS — I10 Essential (primary) hypertension: Secondary | ICD-10-CM

## 2019-12-15 DIAGNOSIS — R300 Dysuria: Secondary | ICD-10-CM | POA: Diagnosis not present

## 2019-12-15 MED ORDER — ATORVASTATIN CALCIUM 80 MG PO TABS: 80.0000 mg | ORAL_TABLET | ORAL | 0 refills | Status: DC

## 2019-12-15 MED ORDER — EZETIMIBE 10 MG PO TABS: 10.0000 mg | ORAL_TABLET | Freq: Every day | ORAL | 1 refills | Status: DC

## 2019-12-15 MED ORDER — ALBUTEROL SULFATE HFA 108 (90 BASE) MCG/ACT IN AERS: 2.0000 | INHALATION_SPRAY | Freq: Four times a day (QID) | RESPIRATORY_TRACT | 3 refills | Status: DC | PRN

## 2019-12-15 MED ORDER — BUDESONIDE-FORMOTEROL FUMARATE 80-4.5 MCG/ACT IN AERO: 2.0000 | INHALATION_SPRAY | Freq: Every day | RESPIRATORY_TRACT | 5 refills | Status: DC

## 2019-12-15 NOTE — Progress Notes (Signed)
[facility] [place], [place] [address]  Internal MEDICINE  Office Visit Note  Patient Name: [name]  [id number]  [id number]  Date of Service: 12/15/2019  Chief Complaint  Patient presents with  . Annual Exam  . Hyperlipidemia  . Hypertension     HPI Pt is here for routine health maintenance examination.  She is a well appearing 64 yo AA female.  She has a history of HTN, HLD, and copd.  Overall she is doing well.  Her blood pressure was initially elevated but improved with recheck.  She has not had a lipid panel checked sine 2019, however continues with zetia.  Denies any complaints or needs at this time.  She is in need of a colonoscopy and mammogram to close metric gaps.   Current Medication: Outpatient Encounter Medications as of 12/15/2019  Medication Sig  . aspirin EC 81 MG EC tablet Take 1 tablet (81 mg total) by mouth daily.  . cetirizine (ZYRTEC) 10 MG tablet Take 10 mg by mouth daily.  . cholecalciferol (VITAMIN D) 400 units TABS tablet Take 400 Units by mouth.  . ferrous sulfate 325 (65 FE) MG tablet Take 325 mg by mouth daily.  . [DISCONTINUED] albuterol (PROVENTIL HFA;VENTOLIN HFA) 108 (90 Base) MCG/ACT inhaler Inhale 2 puffs into the lungs every 6 (six) hours as needed for wheezing or shortness of breath.  . [DISCONTINUED] atorvastatin (LIPITOR) 80 MG tablet Take 1 tablet (80 mg total) by mouth every morning.  . [DISCONTINUED] budesonide-formoterol (SYMBICORT) 80-4.5 MCG/ACT inhaler Inhale 2 puffs into the lungs daily.  . [DISCONTINUED] ezetimibe (ZETIA) 10 MG tablet Take 10 mg by mouth daily.  Marland Kitchen albuterol (VENTOLIN HFA) 108 (90 Base) MCG/ACT inhaler Inhale 2 puffs into the lungs every 6 (six) hours as needed for wheezing or shortness of breath.  Marland Kitchen atorvastatin (LIPITOR) 80 MG tablet Take 1 tablet (80 mg total) by mouth every morning.  . budesonide-formoterol (SYMBICORT) 80-4.5 MCG/ACT inhaler Inhale 2 puffs into the lungs daily.  Marland Kitchen  ezetimibe (ZETIA) 10 MG tablet Take 1 tablet (10 mg total) by mouth daily.   No facility-administered encounter medications on file as of 12/15/2019.    Surgical History: Past Surgical History:  Procedure Laterality Date  . ABDOMINAL HYSTERECTOMY    . APPENDECTOMY    . CARDIAC CATHETERIZATION    . CARDIAC SURGERY    . CHOLECYSTECTOMY    . COLONOSCOPY  12/15/11   OH->bleeding internal hemorrhoids, otherwise normal  . COLONOSCOPY WITH PROPOFOL N/A 04/12/2015   Procedure: COLONOSCOPY WITH PROPOFOL;  Surgeon: Lucilla Lame, MD;  Location: [facility] ENDOSCOPY;  Service: Endoscopy;  Laterality: N/A;  . ENDARTERECTOMY FEMORAL Left 04/26/2015   Procedure: ENDARTERECTOMY FEMORAL;  Surgeon: Algernon Huxley, MD;  Location: [facility] ORS;  Service: Vascular;  Laterality: Left;  . ENDOVASCULAR STENT INSERTION Bilateral    Legs  . FLEXIBLE BRONCHOSCOPY N/A 02/02/2015   Procedure: FLEXIBLE BRONCHOSCOPY;  Surgeon: Allyne Gee, MD;  Location: [facility] ORS;  Service: Pulmonary;  Laterality: N/A;  . HEMORRHOID SURGERY    . PERIPHERAL VASCULAR CATHETERIZATION Left 04/25/2015   Procedure: Lower Extremity Angiography;  Surgeon: Algernon Huxley, MD;  Location: [place] [facility];  Service: Cardiovascular;  Laterality: Left;  . PERIPHERAL VASCULAR CATHETERIZATION Left 04/25/2015   Procedure: Lower Extremity Intervention;  Surgeon: Algernon Huxley, MD;  Location: [place] [facility];  Service: Cardiovascular;  Laterality: Left;  . PERIPHERAL VASCULAR CATHETERIZATION Left 01/02/2016   Procedure: Lower Extremity Angiography;  Surgeon: Corene Cornea  Bunnie Domino, MD;  Location: [place] [facility];  Service: Cardiovascular;  Laterality: Left;  . PERIPHERAL VASCULAR CATHETERIZATION  01/02/2016   Procedure: Lower Extremity Intervention;  Surgeon: Algernon Huxley, MD;  Location: [place] [facility];  Service: Cardiovascular;;  . THROMBECTOMY FEMORAL ARTERY Left 04/26/2015   Procedure: THROMBECTOMY FEMORAL ARTERY;  Surgeon: Algernon Huxley, MD;  Location: [facility];   Service: Vascular;  Laterality: Left;    Medical History: Past Medical History:  Diagnosis Date  . Allergy    Environmental  . Arthritis   . Asthma   . Carotid artery stenosis   . Carotid stenosis   . COPD (chronic obstructive pulmonary disease) ([place])   . Coronary artery disease   . Coronary artery disease   . Hemorrhoids   . Hyperlipidemia   . Hypertension   . 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[facility])    negative cardiac cath  . Nicotine addiction   . Peripheral vascular disease ([place])   . Pneumonia   . Shortness of breath dyspnea   . Tobacco abuse     Family History: Family History  Problem Relation Age of Onset  . Hypertension Mother   . Hypertension Father       Review of Systems  Constitutional: Negative for chills, fatigue and unexpected weight change.  HENT: Negative for congestion, rhinorrhea, sneezing and sore throat.   Eyes: Negative for photophobia, pain and redness.  Respiratory: Negative for cough, chest tightness and shortness of breath.   Cardiovascular: Negative for chest pain and palpitations.  Gastrointestinal: Negative for abdominal pain, constipation, diarrhea, nausea and vomiting.  Endocrine: Negative.   Genitourinary: Negative for dysuria and frequency.  Musculoskeletal: Negative for arthralgias, back pain, joint swelling and neck pain.  Skin: Negative for rash.  Allergic/Immunologic: Negative.   Neurological: Negative for tremors and numbness.  Hematological: Negative for adenopathy. Does not bruise/bleed easily.  Psychiatric/Behavioral: Negative for behavioral problems and sleep disturbance. The patient is not nervous/anxious.      Vital Signs: BP (!) 158/95   Pulse 90   Temp (!) 96 F (35.6 C)   Resp 16   Ht 5\' 4"  (1.626 m)   Wt 120 lb (54.4 kg)   LMP  (LMP Unknown)   SpO2 96%   BMI 20.60 kg/m    Physical Exam Vitals and nursing note reviewed.  Constitutional:      General: She is not in acute distress.    Appearance: She is  well-developed. She is not diaphoretic.  HENT:     Head: Normocephalic and atraumatic.     Mouth/Throat:     Pharynx: No oropharyngeal exudate.  Eyes:     Pupils: Pupils are equal, round, and reactive to light.  Neck:     Thyroid: No thyromegaly.     Vascular: No JVD.     Trachea: No tracheal deviation.  Cardiovascular:     Rate and Rhythm: Normal rate and regular rhythm.     Heart sounds: Normal heart sounds. No murmur. No friction rub. No gallop.   Pulmonary:     Effort: Pulmonary effort is normal. No respiratory distress.     Breath sounds: Normal breath sounds. No wheezing or rales.  Chest:     Chest wall: No tenderness.     Breasts:        Right: Normal.        Left: Normal.     Comments: Exam Chaperoned by Corlis Hove CMA Abdominal:     Palpations: Abdomen is soft.  Tenderness: There is no abdominal tenderness. There is no guarding.  Musculoskeletal:        General: Normal range of motion.     Cervical back: Normal range of motion and neck supple.  Lymphadenopathy:     Cervical: No cervical adenopathy.  Skin:    General: Skin is warm and dry.  Neurological:     Mental Status: She is alert and oriented to person, place, and time.     Cranial Nerves: No cranial nerve deficit.  Psychiatric:        Behavior: Behavior normal.        Thought Content: Thought content normal.        Judgment: Judgment normal.      LABS: No results found for this or any previous visit (from the past 2160 hour(s)).    Assessment/Plan: 1. Encounter for general adult medical examination with abnormal findings PHM addressed.  Mammogram and colonoscopy ordered. - Lipid Panel With LDL/HDL Ratio - TSH - T4, free  2. Chronic obstructive pulmonary disease, unspecified COPD type ([place]) Controlled.  Refilled Symbicort and ventolin as directed.  - budesonide-formoterol (SYMBICORT) 80-4.5 MCG/ACT inhaler; Inhale 2 puffs into the lungs daily.  Dispense: 1 Inhaler; Refill: 5 - albuterol  (VENTOLIN HFA) 108 (90 Base) MCG/ACT inhaler; Inhale 2 puffs into the lungs every 6 (six) hours as needed for wheezing or shortness of breath.  Dispense: 18 g; Refill: 3  3. Essential hypertension Stable, continue present management.   4. Encounter for screening mammogram for breast cancer Mammogram ordered - MM DIGITAL SCREENING BILATERAL; Future  5. Encounter for screening colonoscopy Referral to GI for colonoscopy. - Ambulatory referral to Gastroenterology  6. Mixed hyperlipidemia Stable, continue lipitor. - atorvastatin (LIPITOR) 80 MG tablet; Take 1 tablet (80 mg total) by mouth every morning.  Dispense: 30 tablet; Refill: 0  7. Cigarette nicotine dependence with nicotine-induced disorder Smoking cessation counseling: 1. Pt acknowledges the risks of long term smoking, she will try to quite smoking. 2. Options for different medications including nicotine products, chewing gum, patch etc, Wellbutrin and Chantix is discussed 3. Goal and date of compete cessation is discussed 4. Total time spent in smoking cessation is 15 min.  8. Dysuria - Urinalysis, Routine w reflex microscopic  General Counseling: Nahima verbalizes understanding of the findings of todays visit and agrees with plan of treatment. I have discussed any further diagnostic evaluation that may be needed or ordered today. We also reviewed her medications today. she has been encouraged to call the office with any questions or concerns that should arise related to todays visit.   Orders Placed This Encounter  Procedures  . MM DIGITAL SCREENING BILATERAL  . Urinalysis, Routine w reflex microscopic  . Lipid Panel With LDL/HDL Ratio  . TSH  . T4, free  . Ambulatory referral to Gastroenterology    Meds ordered this encounter  Medications  . ezetimibe (ZETIA) 10 MG tablet    Sig: Take 1 tablet (10 mg total) by mouth daily.    Dispense:  90 tablet    Refill:  1  . atorvastatin (LIPITOR) 80 MG tablet    Sig: Take  1 tablet (80 mg total) by mouth every morning.    Dispense:  30 tablet    Refill:  0    PT NEEDS APPT FOR FURTHER REFILLS.  Marland Kitchen budesonide-formoterol (SYMBICORT) 80-4.5 MCG/ACT inhaler    Sig: Inhale 2 puffs into the lungs daily.    Dispense:  1 Inhaler    Refill:  5  . albuterol (VENTOLIN HFA) 108 (90 Base) MCG/ACT inhaler    Sig: Inhale 2 puffs into the lungs every 6 (six) hours as needed for wheezing or shortness of breath.    Dispense:  18 g    Refill:  3    Please fill with generic alternative preferred by her insurance.    Time spent: 30 Minutes   This patient was seen by Orson Gear AGNP-C in Collaboration with Dr Lavera Guise as a part of collaborative care agreement    Kendell Bane AGNP-C Internal Medicine

## 2019-12-16 LAB — URINALYSIS, ROUTINE W REFLEX MICROSCOPIC
Bilirubin, UA: NEGATIVE
Glucose, UA: NEGATIVE
Nitrite, UA: NEGATIVE
RBC, UA: NEGATIVE
Specific Gravity, UA: 1.022 (ref 1.005–1.030)
Urobilinogen, Ur: 0.2 mg/dL (ref 0.2–1.0)
pH, UA: 5 (ref 5.0–7.5)

## 2019-12-16 LAB — MICROSCOPIC EXAMINATION: Casts: NONE SEEN /lpf

## 2019-12-18 ENCOUNTER — Other Ambulatory Visit: Payer: Self-pay

## 2019-12-18 ENCOUNTER — Telehealth (INDEPENDENT_AMBULATORY_CARE_PROVIDER_SITE_OTHER): Payer: Self-pay | Admitting: Gastroenterology

## 2019-12-18 DIAGNOSIS — Z1211 Encounter for screening for malignant neoplasm of colon: Secondary | ICD-10-CM

## 2019-12-18 NOTE — Progress Notes (Signed)
Gastroenterology Pre-Procedure Review  Request Date: Tuesday May 4th Requesting Physician: Dr. Allen Norris  PATIENT REVIEW QUESTIONS: The patient responded to the following health history questions as indicated:    1. Are you having any GI issues? no 2. Do you have a personal history of Polyps? yes (2018 with dr. Allen Norris) 3. Do you have a family history of Colon Cancer or Polyps? no 4. Diabetes Mellitus? no 5. Joint replacements in the past 12 months?no 6. Major health problems in the past 3 months?no 7. Any artificial heart valves, MVP, or defibrillator?no    MEDICATIONS & ALLERGIES:    Patient reports the following regarding taking any anticoagulation/antiplatelet therapy:   Plavix, Coumadin, Eliquis, Xarelto, Lovenox, Pradaxa, Brilinta, or Effient? no Aspirin? Yes 81 mg  Patient confirms/reports the following medications:  Current Outpatient Medications  Medication Sig Dispense Refill  . albuterol (VENTOLIN HFA) 108 (90 Base) MCG/ACT inhaler Inhale 2 puffs into the lungs every 6 (six) hours as needed for wheezing or shortness of breath. 18 g 3  . aspirin EC 81 MG EC tablet Take 1 tablet (81 mg total) by mouth daily. 90 tablet 1  . atorvastatin (LIPITOR) 80 MG tablet Take 1 tablet (80 mg total) by mouth every morning. 30 tablet 0  . budesonide-formoterol (SYMBICORT) 80-4.5 MCG/ACT inhaler Inhale 2 puffs into the lungs daily. 1 Inhaler 5  . cetirizine (ZYRTEC) 10 MG tablet Take 10 mg by mouth daily.    . cholecalciferol (VITAMIN D) 400 units TABS tablet Take 400 Units by mouth.    . ferrous sulfate 325 (65 FE) MG tablet Take 325 mg by mouth daily.    Marland Kitchen ezetimibe (ZETIA) 10 MG tablet Take 1 tablet (10 mg total) by mouth daily. (Patient not taking: Reported on 12/18/2019) 90 tablet 1  . losartan (COZAAR) 50 MG tablet Take 50 mg by mouth daily.     No current facility-administered medications for this visit.    Patient confirms/reports the following allergies:  Allergies  Allergen Reactions   . Aspirin Nausea And Vomiting and Other (See Comments)    Pt states aspirin makes her cramp and have to use coated kind.  . Tramadol   . Zyrtec [Cetirizine]     No orders of the defined types were placed in this encounter.   AUTHORIZATION INFORMATION Primary Insurance: 1D#: Group #:  Secondary Insurance: 1D#: Group #:  SCHEDULE INFORMATION: Date: 01/05/20 Time: Location:[facility]

## 2020-01-01 ENCOUNTER — Other Ambulatory Visit
Admission: RE | Admit: 2020-01-01 | Discharge: 2020-01-01 | Disposition: A | Discharge status: Home / Self Care | Payer: Medicare Other | Source: Ambulatory Visit | Attending: Gastroenterology | Admitting: Gastroenterology

## 2020-01-01 ENCOUNTER — Other Ambulatory Visit: Payer: Self-pay

## 2020-01-01 DIAGNOSIS — Z20822 Contact with and (suspected) exposure to covid-19: Secondary | ICD-10-CM | POA: Insufficient documentation

## 2020-01-01 DIAGNOSIS — Z01812 Encounter for preprocedural laboratory examination: Secondary | ICD-10-CM | POA: Diagnosis not present

## 2020-01-01 DIAGNOSIS — I10 Essential (primary) hypertension: Secondary | ICD-10-CM | POA: Diagnosis not present

## 2020-01-01 DIAGNOSIS — I2510 Atherosclerotic heart disease of native coronary artery without angina pectoris: Secondary | ICD-10-CM | POA: Diagnosis not present

## 2020-01-01 DIAGNOSIS — E782 Mixed hyperlipidemia: Secondary | ICD-10-CM | POA: Diagnosis not present

## 2020-01-01 DIAGNOSIS — I739 Peripheral vascular disease, unspecified: Secondary | ICD-10-CM | POA: Diagnosis not present

## 2020-01-01 LAB — SARS CORONAVIRUS 2 (TAT 6-24 HRS): SARS Coronavirus 2: NEGATIVE

## 2020-01-05 ENCOUNTER — Ambulatory Visit: Payer: Medicare Other | Admitting: Certified Registered"

## 2020-01-05 ENCOUNTER — Other Ambulatory Visit: Payer: Self-pay

## 2020-01-05 ENCOUNTER — Encounter: Payer: Self-pay | Admitting: Gastroenterology

## 2020-01-05 ENCOUNTER — Ambulatory Visit
Admission: RE | Admit: 2020-01-05 | Discharge: 2020-01-05 | Disposition: A | Discharge status: Home / Self Care | Payer: Medicare Other | Attending: Gastroenterology | Admitting: Gastroenterology

## 2020-01-05 ENCOUNTER — Encounter
Admission: RE | Disposition: A | Discharge status: Home / Self Care | Payer: Self-pay | Source: Home / Self Care | Attending: Gastroenterology

## 2020-01-05 DIAGNOSIS — Z860100 Personal history of colon polyps, unspecified: Secondary | ICD-10-CM

## 2020-01-05 DIAGNOSIS — I252 Old myocardial infarction: Secondary | ICD-10-CM | POA: Diagnosis not present

## 2020-01-05 DIAGNOSIS — K635 Polyp of colon: Secondary | ICD-10-CM

## 2020-01-05 DIAGNOSIS — Z7951 Long term (current) use of inhaled steroids: Secondary | ICD-10-CM | POA: Insufficient documentation

## 2020-01-05 DIAGNOSIS — I2510 Atherosclerotic heart disease of native coronary artery without angina pectoris: Secondary | ICD-10-CM | POA: Insufficient documentation

## 2020-01-05 DIAGNOSIS — I10 Essential (primary) hypertension: Secondary | ICD-10-CM | POA: Insufficient documentation

## 2020-01-05 DIAGNOSIS — F17210 Nicotine dependence, cigarettes, uncomplicated: Secondary | ICD-10-CM | POA: Diagnosis not present

## 2020-01-05 DIAGNOSIS — J449 Chronic obstructive pulmonary disease, unspecified: Secondary | ICD-10-CM | POA: Diagnosis not present

## 2020-01-05 DIAGNOSIS — Z8719 Personal history of other diseases of the digestive system: Secondary | ICD-10-CM | POA: Insufficient documentation

## 2020-01-05 DIAGNOSIS — Z1211 Encounter for screening for malignant neoplasm of colon: Secondary | ICD-10-CM

## 2020-01-05 DIAGNOSIS — I6529 Occlusion and stenosis of unspecified carotid artery: Secondary | ICD-10-CM | POA: Diagnosis not present

## 2020-01-05 DIAGNOSIS — K641 Second degree hemorrhoids: Secondary | ICD-10-CM | POA: Diagnosis not present

## 2020-01-05 DIAGNOSIS — D125 Benign neoplasm of sigmoid colon: Secondary | ICD-10-CM | POA: Insufficient documentation

## 2020-01-05 DIAGNOSIS — Z79899 Other long term (current) drug therapy: Secondary | ICD-10-CM | POA: Insufficient documentation

## 2020-01-05 DIAGNOSIS — D120 Benign neoplasm of cecum: Secondary | ICD-10-CM | POA: Diagnosis not present

## 2020-01-05 DIAGNOSIS — I739 Peripheral vascular disease, unspecified: Secondary | ICD-10-CM | POA: Insufficient documentation

## 2020-01-05 DIAGNOSIS — K219 Gastro-esophageal reflux disease without esophagitis: Secondary | ICD-10-CM | POA: Diagnosis not present

## 2020-01-05 DIAGNOSIS — D649 Anemia, unspecified: Secondary | ICD-10-CM | POA: Diagnosis not present

## 2020-01-05 DIAGNOSIS — Z7689 Persons encountering health services in other specified circumstances: Secondary | ICD-10-CM | POA: Diagnosis not present

## 2020-01-05 DIAGNOSIS — Z7982 Long term (current) use of aspirin: Secondary | ICD-10-CM | POA: Diagnosis not present

## 2020-01-05 HISTORY — PX: COLONOSCOPY WITH PROPOFOL: SHX5780

## 2020-01-05 SURGERY — COLONOSCOPY WITH PROPOFOL
Anesthesia: General

## 2020-01-05 MED ORDER — LIDOCAINE HCL (PF) 2 % IJ SOLN
INTRAMUSCULAR | Status: AC
  Filled 2020-01-05: qty 5

## 2020-01-05 MED ORDER — PROPOFOL 500 MG/50ML IV EMUL
INTRAVENOUS | Status: DC | PRN
  Administered 2020-01-05: 100 ug/kg/min via INTRAVENOUS

## 2020-01-05 MED ORDER — LIDOCAINE HCL (CARDIAC) PF 100 MG/5ML IV SOSY
PREFILLED_SYRINGE | INTRAVENOUS | Status: DC | PRN
  Administered 2020-01-05: 60 mg via INTRAVENOUS

## 2020-01-05 MED ORDER — PROPOFOL 10 MG/ML IV BOLUS
INTRAVENOUS | Status: DC | PRN
  Administered 2020-01-05: 50 mg via INTRAVENOUS
  Administered 2020-01-05: 20 mg via INTRAVENOUS

## 2020-01-05 MED ORDER — SODIUM CHLORIDE 0.9 % IV SOLN
INTRAVENOUS | Status: DC
  Administered 2020-01-05: 1000 mL via INTRAVENOUS

## 2020-01-05 MED ORDER — PROPOFOL 500 MG/50ML IV EMUL
INTRAVENOUS | Status: AC
  Filled 2020-01-05: qty 50

## 2020-01-05 NOTE — Op Note (Signed)
[facility] Gastroenterology Patient Name: [name] Procedure Date: 01/05/2020 10:40 AM MRN: [id number] Account #: [id number] Date of Birth: Jul 12, 1956 Admit Type: Outpatient Age: 64 Room: [facility] ENDO ROOM 4 Gender: Female Note Status: Finalized Procedure:             Colonoscopy Indications:           High risk colon cancer surveillance: Personal history                         of colonic polyps Providers:             Lucilla Lame MD, MD Referring MD:          No Local Md, MD (Referring MD) Medicines:             Propofol per Anesthesia Complications:         No immediate complications. Procedure:             Pre-Anesthesia Assessment:                        - Prior to the procedure, a History and Physical was                         performed, and patient medications and allergies were                         reviewed. The patient's tolerance of previous                         anesthesia was also reviewed. The risks and benefits                         of the procedure and the sedation options and risks                         were discussed with the patient. All questions were                         answered, and informed consent was obtained. Prior                         Anticoagulants: The patient has taken no previous                         anticoagulant or antiplatelet agents. ASA Grade                         Assessment: II - A patient with mild systemic disease.                         After reviewing the risks and benefits, the patient                         was deemed in satisfactory condition to undergo the                         procedure.  After obtaining informed consent, the colonoscope was                         passed under direct vision. Throughout the procedure,                         the patient's blood pressure, pulse, and oxygen                         saturations were monitored continuously. The             Colonoscope was introduced through the anus and                         advanced to the the cecum, identified by appendiceal                         orifice and ileocecal valve. The colonoscopy was                         performed without difficulty. The patient tolerated                         the procedure well. The quality of the bowel                         preparation was excellent. Findings:      The perianal and digital rectal examinations were normal.      A 7 mm polyp was found in the sigmoid colon. The polyp was pedunculated.       The polyp was removed with a hot snare. Resection and retrieval were       complete.      A 3 mm polyp was found in the cecum. The polyp was sessile. The polyp       was removed with a cold biopsy forceps. Resection and retrieval were       complete.      Non-bleeding internal hemorrhoids were found during retroflexion. The       hemorrhoids were Grade II (internal hemorrhoids that prolapse but reduce       spontaneously). Impression:            - One 7 mm polyp in the sigmoid colon, removed with a                         hot snare. Resected and retrieved.                        - One 3 mm polyp in the cecum, removed with a cold                         biopsy forceps. Resected and retrieved.                        - Non-bleeding internal hemorrhoids. Recommendation:        - Discharge patient to home.                        - Resume previous diet.                        -  Continue present medications.                        - Await pathology results.                        - Repeat colonoscopy in 5 years for surveillance. Procedure Code(s):     --- Professional ---                        [phone], Colonoscopy, flexible; with removal of                         tumor(s), polyp(s), or other lesion(s) by snare                         technique                        45380, 52, Colonoscopy, flexible; with biopsy, single                          or multiple Diagnosis Code(s):     --- Professional ---                        Z86.010, Personal history of colonic polyps                        K63.5, Polyp of colon CPT copyright 2019 American Medical Association. All rights reserved. The codes documented in this report are preliminary and upon coder review may  be revised to meet current compliance requirements. Lucilla Lame MD, MD 01/05/2020 11:05:09 AM This report has been signed electronically. Number of Addenda: 0 Note Initiated On: 01/05/2020 10:40 AM Scope Withdrawal Time: 0 hours 6 minutes 9 seconds  Total Procedure Duration: 0 hours 10 minutes 29 seconds  Estimated Blood Loss:  Estimated blood loss: none.      [facility]

## 2020-01-05 NOTE — H&P (Signed)
Lucilla Lame, MD [facility] [address], [place], [place] [address] Phone:[phone] Fax : [phone]  Primary Care Physician:  Ronnell Freshwater, NP Primary Gastroenterologist:  Dr. Allen Norris  Pre-Procedure History & Physical: HPI:  [name] is a 64 y.o. female is here for an colonoscopy.   Past Medical History:  Diagnosis Date  . Allergy    Environmental  . Arthritis   . Asthma   . Carotid artery stenosis   . Carotid stenosis   . COPD (chronic obstructive pulmonary disease) ([place])   . Coronary artery disease   . Coronary artery disease   . Hemorrhoids   . Hyperlipidemia   . Hypertension   . 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[facility])    negative cardiac cath  . Nicotine addiction   . Peripheral vascular disease ([place])   . Pneumonia   . Shortness of breath dyspnea   . Tobacco abuse     Past Surgical History:  Procedure Laterality Date  . ABDOMINAL HYSTERECTOMY    . APPENDECTOMY    . CARDIAC CATHETERIZATION    . CARDIAC SURGERY    . CHOLECYSTECTOMY    . COLONOSCOPY  12/15/11   OH->bleeding internal hemorrhoids, otherwise normal  . COLONOSCOPY WITH PROPOFOL N/A 04/12/2015   Procedure: COLONOSCOPY WITH PROPOFOL;  Surgeon: Lucilla Lame, MD;  Location: [facility] ENDOSCOPY;  Service: Endoscopy;  Laterality: N/A;  . ENDARTERECTOMY FEMORAL Left 04/26/2015   Procedure: ENDARTERECTOMY FEMORAL;  Surgeon: Algernon Huxley, MD;  Location: [facility] ORS;  Service: Vascular;  Laterality: Left;  . ENDOVASCULAR STENT INSERTION Bilateral    Legs  . FLEXIBLE BRONCHOSCOPY N/A 02/02/2015   Procedure: FLEXIBLE BRONCHOSCOPY;  Surgeon: Allyne Gee, MD;  Location: [facility] ORS;  Service: Pulmonary;  Laterality: N/A;  . HEMORRHOID SURGERY    . PERIPHERAL VASCULAR CATHETERIZATION Left 04/25/2015   Procedure: Lower Extremity Angiography;  Surgeon: Algernon Huxley, MD;  Location: [place] CV LAB;  Service: Cardiovascular;  Laterality: Left;  . PERIPHERAL VASCULAR CATHETERIZATION Left 04/25/2015   Procedure: Lower Extremity  Intervention;  Surgeon: Algernon Huxley, MD;  Location: [place] [facility];  Service: Cardiovascular;  Laterality: Left;  . PERIPHERAL VASCULAR CATHETERIZATION Left 01/02/2016   Procedure: Lower Extremity Angiography;  Surgeon: Algernon Huxley, MD;  Location: [place] [facility];  Service: Cardiovascular;  Laterality: Left;  . PERIPHERAL VASCULAR CATHETERIZATION  01/02/2016   Procedure: Lower Extremity Intervention;  Surgeon: Algernon Huxley, MD;  Location: [place] CV [facility];  Service: Cardiovascular;;  . THROMBECTOMY FEMORAL ARTERY Left 04/26/2015   Procedure: THROMBECTOMY FEMORAL ARTERY;  Surgeon: Algernon Huxley, MD;  Location: [facility] ORS;  Service: Vascular;  Laterality: Left;    Prior to Admission medications   Medication Sig Start Date End Date Taking? Authorizing Provider  aspirin EC 81 MG EC tablet Take 1 tablet (81 mg total) by mouth daily. 04/29/15  Yes Algernon Huxley, MD  atorvastatin (LIPITOR) 80 MG tablet Take 1 tablet (80 mg total) by mouth every morning. 12/15/19  Yes Scarboro, Audie Clear, NP  budesonide-formoterol (SYMBICORT) 80-4.5 MCG/ACT inhaler Inhale 2 puffs into the lungs daily. 12/15/19  Yes Scarboro, Audie Clear, NP  cetirizine (ZYRTEC) 10 MG tablet Take 10 mg by mouth daily.   Yes [provider]  cholecalciferol (VITAMIN D) 400 units TABS tablet Take 400 Units by mouth.   Yes [provider]  ferrous sulfate 325 (65 FE) MG tablet Take 325 mg by mouth daily.   Yes [provider]  losartan (COZAAR) 50  MG tablet Take 50 mg by mouth daily. 10/28/19  Yes [provider]  albuterol (VENTOLIN HFA) 108 (90 Base) MCG/ACT inhaler Inhale 2 puffs into the lungs every 6 (six) hours as needed for wheezing or shortness of breath. 12/15/19   Kendell Bane, NP  ezetimibe (ZETIA) 10 MG tablet Take 1 tablet (10 mg total) by mouth daily. Patient not taking: Reported on 12/18/2019 12/15/19   Kendell Bane, NP    Allergies as of 12/18/2019 - Review Complete 12/18/2019  Allergen  Reaction Noted  . Aspirin Nausea And Vomiting and Other (See Comments) 01/22/2015  . Tramadol  11/11/2017  . Zyrtec [cetirizine]  09/06/2016    Family History  Problem Relation Age of Onset  . Hypertension Mother   . Hypertension Father     Social History   Socioeconomic History  . Marital status: Single    Spouse name: Not on file  . Number of children: Not on file  . Years of education: Not on file  . Highest education level: Not on file  Occupational History  . Not on file  Tobacco Use  . Smoking status: Current Every Day Smoker    Packs/day: 1.00    Years: 41.00    Pack years: 41.00    Types: Cigarettes  . Smokeless tobacco: Never Used  . Tobacco comment: pt recommend to stop smoking 4 min spent will start on prn nicotine replacment  Substance and Sexual Activity  . Alcohol use: Yes    Alcohol/week: 0.0 standard drinks    Comment: weekends, couple beers, pint of liquer only on weekends.  Last drank approx 1 month ago.  . Drug use: No  . Sexual activity: Not Currently  Other Topics Concern  . Not on file  Social History Narrative   Lives with family    Social Determinants of Health   Financial Resource Strain:   . Difficulty of Paying Living Expenses:   Food Insecurity:   . Worried About Charity fundraiser in the Last Year:   . Arboriculturist in the Last Year:   Transportation Needs:   . Film/video editor (Medical):   Marland Kitchen Lack of Transportation (Non-Medical):   Physical Activity:   . Days of Exercise per Week:   . Minutes of Exercise per Session:   Stress:   . Feeling of Stress :   Social Connections:   . Frequency of Communication with Friends and Family:   . Frequency of Social Gatherings with Friends and Family:   . Attends Religious Services:   . Active Member of Clubs or Organizations:   . Attends Archivist Meetings:   Marland Kitchen Marital Status:   Intimate Partner Violence:   . Fear of Current or Ex-Partner:   . Emotionally Abused:   Marland Kitchen  Physically Abused:   . Sexually Abused:     Review of Systems: See HPI, otherwise negative ROS  Physical Exam: BP (!) 157/111   Temp (!) 96.4 F (35.8 C) (Temporal)   Resp 16   Ht 5\' 4"  (1.626 m)   Wt 54.5 kg   LMP  (LMP Unknown)   SpO2 99%   BMI 20.64 kg/m  General:   Alert,  pleasant and cooperative in NAD Head:  Normocephalic and atraumatic. Neck:  Supple; no masses or thyromegaly. Lungs:  Clear throughout to auscultation.    Heart:  Regular rate and rhythm. Abdomen:  Soft, nontender and nondistended. Normal bowel sounds, without guarding, and without rebound.  Neurologic:  Alert and  oriented x4;  grossly normal neurologically.  Impression/Plan: [name] is here for an colonoscopy to be performed for adenomatous polyps 04/2015  Risks, benefits, limitations, and alternatives regarding  colonoscopy have been reviewed with the patient.  Questions have been answered.  All parties agreeable.   Lucilla Lame, MD  01/05/2020, 10:14 AM

## 2020-01-05 NOTE — Transfer of Care (Signed)
Immediate Anesthesia Transfer of Care Note  Patient: [name]  Procedure(s) Performed: COLONOSCOPY WITH PROPOFOL (N/A )  Patient Location: PACU and Endoscopy Unit  Anesthesia Type:General  Level of Consciousness: drowsy  Airway & Oxygen Therapy: Patient Spontanous Breathing  Post-op Assessment: Report given to RN and Post -op Vital signs reviewed and stable  Post vital signs: Reviewed and stable  Last Vitals:  Vitals Value Taken Time  BP 115/83 01/05/20 1108  Temp    Pulse 93 01/05/20 1108  Resp 21 01/05/20 1108  SpO2 100 % 01/05/20 1108  Vitals shown include unvalidated device data.  Last Pain:  Vitals:   01/05/20 0955  TempSrc: Temporal  PainSc: 0-No pain         Complications: No apparent anesthesia complications

## 2020-01-05 NOTE — Anesthesia Postprocedure Evaluation (Signed)
Anesthesia Post Note  Patient: [name]  Procedure(s) Performed: COLONOSCOPY WITH PROPOFOL (N/A )  Patient location during evaluation: Endoscopy Anesthesia Type: General Level of consciousness: awake and alert Pain management: pain level controlled Vital Signs Assessment: post-procedure vital signs reviewed and stable Respiratory status: spontaneous breathing and respiratory function stable Cardiovascular status: stable Anesthetic complications: no     Last Vitals:  Vitals:   01/05/20 0955 01/05/20 1110  BP: (!) 157/111 115/83  Pulse:  89  Resp: 16 (!) 25  Temp: (!) 35.8 C   SpO2: 99% 99%    Last Pain:  Vitals:   01/05/20 0955  TempSrc: Temporal  PainSc: 0-No pain                 Cagney Steenson K

## 2020-01-05 NOTE — Anesthesia Preprocedure Evaluation (Signed)
Anesthesia Evaluation  Patient identified by MRN, date of birth, ID band Patient awake    Reviewed: Allergy & Precautions, NPO status , Patient's Chart, lab work & pertinent test results  History of Anesthesia Complications Negative for: history of anesthetic complications  Airway Mallampati: III       Dental  (+) Missing, Chipped   Pulmonary neg sleep apnea, COPD,  COPD inhaler, Current Smoker,           Cardiovascular hypertension, Pt. on medications + Past MI  (-) CHF (-) dysrhythmias (-) Valvular Problems/Murmurs     Neuro/Psych neg Seizures    GI/Hepatic Neg liver ROS, GERD  ,  Endo/Other  neg diabetes  Renal/GU negative Renal ROS     Musculoskeletal   Abdominal   Peds  Hematology  (+) anemia ,   Anesthesia Other Findings   Reproductive/Obstetrics                             Anesthesia Physical Anesthesia Plan  ASA: III  Anesthesia Plan: General   Post-op Pain Management:    Induction: Intravenous  PONV Risk Score and Plan: 2 and Propofol infusion and TIVA  Airway Management Planned: Nasal Cannula  Additional Equipment:   Intra-op Plan:   Post-operative Plan:   Informed Consent: I have reviewed the patients History and Physical, chart, labs and discussed the procedure including the risks, benefits and alternatives for the proposed anesthesia with the patient or authorized representative who has indicated his/her understanding and acceptance.       Plan Discussed with:   Anesthesia Plan Comments:         Anesthesia Quick Evaluation

## 2020-01-06 ENCOUNTER — Encounter: Payer: Self-pay | Admitting: Gastroenterology

## 2020-01-06 ENCOUNTER — Other Ambulatory Visit: Payer: Self-pay | Admitting: Adult Health

## 2020-01-06 ENCOUNTER — Encounter: Payer: Self-pay | Admitting: *Deleted

## 2020-01-06 DIAGNOSIS — Z1231 Encounter for screening mammogram for malignant neoplasm of breast: Secondary | ICD-10-CM

## 2020-01-06 LAB — SURGICAL PATHOLOGY

## 2020-01-13 ENCOUNTER — Ambulatory Visit
Admission: RE | Admit: 2020-01-13 | Discharge: 2020-01-13 | Disposition: A | Discharge status: Home / Self Care | Payer: Medicare Other | Source: Ambulatory Visit | Attending: Adult Health | Admitting: Adult Health

## 2020-01-13 DIAGNOSIS — Z1231 Encounter for screening mammogram for malignant neoplasm of breast: Secondary | ICD-10-CM | POA: Insufficient documentation

## 2020-01-13 DIAGNOSIS — E782 Mixed hyperlipidemia: Secondary | ICD-10-CM | POA: Diagnosis not present

## 2020-01-13 DIAGNOSIS — E079 Disorder of thyroid, unspecified: Secondary | ICD-10-CM | POA: Diagnosis not present

## 2020-01-13 DIAGNOSIS — R946 Abnormal results of thyroid function studies: Secondary | ICD-10-CM | POA: Diagnosis not present

## 2020-01-13 DIAGNOSIS — Z0001 Encounter for general adult medical examination with abnormal findings: Secondary | ICD-10-CM | POA: Diagnosis not present

## 2020-01-14 ENCOUNTER — Telehealth: Payer: Self-pay

## 2020-01-14 LAB — T4, FREE: Free T4: 1.2 ng/dL (ref 0.82–1.77)

## 2020-01-14 LAB — TSH: TSH: 1.27 u[IU]/mL (ref 0.450–4.500)

## 2020-01-14 LAB — LIPID PANEL WITH LDL/HDL RATIO
Cholesterol, Total: 89 mg/dL — ABNORMAL LOW (ref 100–199)
HDL: 62 mg/dL (ref >39)
LDL Chol Calc (NIH): 11 mg/dL (ref 0–99)
LDL/HDL Ratio: 0.2 ratio (ref 0.0–3.2)
Triglycerides: 79 mg/dL (ref 0–149)
VLDL Cholesterol Cal: 16 mg/dL (ref 5–40)

## 2020-01-14 NOTE — Telephone Encounter (Signed)
Confirmed appointment on 01/18/2020 and screened for covid. [facility] 

## 2020-01-18 ENCOUNTER — Encounter: Payer: Self-pay | Admitting: Internal Medicine

## 2020-01-18 ENCOUNTER — Other Ambulatory Visit: Payer: Self-pay

## 2020-01-18 ENCOUNTER — Ambulatory Visit (INDEPENDENT_AMBULATORY_CARE_PROVIDER_SITE_OTHER): Payer: Medicare Other | Admitting: Internal Medicine

## 2020-01-18 VITALS — BP 145/90 | HR 73 | Temp 96.2°F | Resp 16 | Ht 64.0 in | Wt 128.0 lb

## 2020-01-18 DIAGNOSIS — R0602 Shortness of breath: Secondary | ICD-10-CM | POA: Diagnosis not present

## 2020-01-18 DIAGNOSIS — I2510 Atherosclerotic heart disease of native coronary artery without angina pectoris: Secondary | ICD-10-CM

## 2020-01-18 DIAGNOSIS — F17219 Nicotine dependence, cigarettes, with unspecified nicotine-induced disorders: Secondary | ICD-10-CM | POA: Diagnosis not present

## 2020-01-18 DIAGNOSIS — J449 Chronic obstructive pulmonary disease, unspecified: Secondary | ICD-10-CM | POA: Diagnosis not present

## 2020-01-18 DIAGNOSIS — R911 Solitary pulmonary nodule: Secondary | ICD-10-CM | POA: Diagnosis not present

## 2020-01-18 NOTE — Patient Instructions (Signed)
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
## 2020-01-25 ENCOUNTER — Telehealth: Payer: Self-pay

## 2020-01-25 NOTE — Telephone Encounter (Signed)
Confirmed appointment on 01/27/2020 and screened for covid. klh 

## 2020-01-27 ENCOUNTER — Ambulatory Visit (INDEPENDENT_AMBULATORY_CARE_PROVIDER_SITE_OTHER): Payer: Medicare Other | Admitting: Adult Health

## 2020-01-27 ENCOUNTER — Encounter: Payer: Self-pay | Admitting: Adult Health

## 2020-01-27 ENCOUNTER — Other Ambulatory Visit: Payer: Self-pay

## 2020-01-27 VITALS — BP 136/82 | HR 69 | Temp 96.6°F | Resp 16 | Ht 64.0 in | Wt 118.0 lb

## 2020-01-27 DIAGNOSIS — J449 Chronic obstructive pulmonary disease, unspecified: Secondary | ICD-10-CM

## 2020-01-27 DIAGNOSIS — I10 Essential (primary) hypertension: Secondary | ICD-10-CM | POA: Diagnosis not present

## 2020-01-27 DIAGNOSIS — E782 Mixed hyperlipidemia: Secondary | ICD-10-CM | POA: Diagnosis not present

## 2020-01-27 DIAGNOSIS — F17219 Nicotine dependence, cigarettes, with unspecified nicotine-induced disorders: Secondary | ICD-10-CM | POA: Diagnosis not present

## 2020-01-27 MED ORDER — LOSARTAN POTASSIUM 50 MG PO TABS: 50.0000 mg | ORAL_TABLET | Freq: Every day | ORAL | 1 refills | Status: DC

## 2020-01-27 NOTE — Progress Notes (Signed)
[facility] [place], [place] [address]  Internal MEDICINE  Office Visit Note  Patient Name: [name]  [id number]  [id number]  Date of Service: 01/27/2020  Chief Complaint  Patient presents with  . Follow-up    review labs and other test  . Hyperlipidemia  . Hypertension    HPI Pt is here for follow up on HTN, HLD and to follow up on lipid panel. Overall she is doing well.  Denies any pain or needs.  Her Lipid panel had not been checked since 2019.  Her lipid panel looks good. She currently takes 80mg  of Lipitor daily.  Her blood pressure remains well controlled. .Denies Chest pain, Shortness of breath, palpitations, headache, or blurred vision.       Current Medication: Outpatient Encounter Medications as of 01/27/2020  Medication Sig  . albuterol (VENTOLIN HFA) 108 (90 Base) MCG/ACT inhaler Inhale 2 puffs into the lungs every 6 (six) hours as needed for wheezing or shortness of breath.  Marland Kitchen aspirin EC 81 MG EC tablet Take 1 tablet (81 mg total) by mouth daily.  Marland Kitchen atorvastatin (LIPITOR) 80 MG tablet Take 1 tablet (80 mg total) by mouth every morning.  . budesonide-formoterol (SYMBICORT) 80-4.5 MCG/ACT inhaler Inhale 2 puffs into the lungs daily.  . cetirizine (ZYRTEC) 10 MG tablet Take 10 mg by mouth daily.  . cholecalciferol (VITAMIN D) 400 units TABS tablet Take 400 Units by mouth.  . ferrous sulfate 325 (65 FE) MG tablet Take 325 mg by mouth daily.  Marland Kitchen losartan (COZAAR) 50 MG tablet Take 50 mg by mouth daily.   No facility-administered encounter medications on file as of 01/27/2020.    Surgical History: Past Surgical History:  Procedure Laterality Date  . ABDOMINAL HYSTERECTOMY    . APPENDECTOMY    . CARDIAC CATHETERIZATION    . CARDIAC SURGERY    . CHOLECYSTECTOMY    . COLONOSCOPY  12/15/11   OH->bleeding internal hemorrhoids, otherwise normal  . COLONOSCOPY WITH PROPOFOL N/A 04/12/2015   Procedure: COLONOSCOPY WITH PROPOFOL;  Surgeon:  Lucilla Lame, MD;  Location: [facility] ENDOSCOPY;  Service: Endoscopy;  Laterality: N/A;  . COLONOSCOPY WITH PROPOFOL N/A 01/05/2020   Procedure: COLONOSCOPY WITH PROPOFOL;  Surgeon: Lucilla Lame, MD;  Location: [facility] ENDOSCOPY;  Service: Endoscopy;  Laterality: N/A;  . ENDARTERECTOMY FEMORAL Left 04/26/2015   Procedure: ENDARTERECTOMY FEMORAL;  Surgeon: Algernon Huxley, MD;  Location: [facility] ORS;  Service: Vascular;  Laterality: Left;  . ENDOVASCULAR STENT INSERTION Bilateral    Legs  . FLEXIBLE BRONCHOSCOPY N/A 02/02/2015   Procedure: FLEXIBLE BRONCHOSCOPY;  Surgeon: Allyne Gee, MD;  Location: [facility] ORS;  Service: Pulmonary;  Laterality: N/A;  . HEMORRHOID SURGERY    . PERIPHERAL VASCULAR CATHETERIZATION Left 04/25/2015   Procedure: Lower Extremity Angiography;  Surgeon: Algernon Huxley, MD;  Location: [place] [facility];  Service: Cardiovascular;  Laterality: Left;  . PERIPHERAL VASCULAR CATHETERIZATION Left 04/25/2015   Procedure: Lower Extremity Intervention;  Surgeon: Algernon Huxley, MD;  Location: [place] [facility];  Service: Cardiovascular;  Laterality: Left;  . PERIPHERAL VASCULAR CATHETERIZATION Left 01/02/2016   Procedure: Lower Extremity Angiography;  Surgeon: Algernon Huxley, MD;  Location: [place] [facility];  Service: Cardiovascular;  Laterality: Left;  . PERIPHERAL VASCULAR CATHETERIZATION  01/02/2016   Procedure: Lower Extremity Intervention;  Surgeon: Algernon Huxley, MD;  Location: [place] [facility];  Service: Cardiovascular;;  . THROMBECTOMY FEMORAL ARTERY Left 04/26/2015   Procedure: THROMBECTOMY FEMORAL ARTERY;  Surgeon: Algernon Huxley, MD;  Location: [facility] ORS;  Service: Vascular;  Laterality: Left;    Medical History: Past Medical History:  Diagnosis Date  . Allergy    Environmental  . Arthritis   . Asthma   . Carotid artery stenosis   . Carotid stenosis   . COPD (chronic obstructive pulmonary disease) ([place])   . Coronary artery disease   . Coronary artery disease   . Hemorrhoids   .  Hyperlipidemia   . Hypertension   . 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[facility])    negative cardiac cath  . Nicotine addiction   . Peripheral vascular disease ([place])   . Pneumonia   . Shortness of breath dyspnea   . Tobacco abuse     Family History: Family History  Problem Relation Age of Onset  . Hypertension Mother   . Hypertension Father     Social History   Socioeconomic History  . Marital status: Single    Spouse name: Not on file  . Number of children: Not on file  . Years of education: Not on file  . Highest education level: Not on file  Occupational History  . Not on file  Tobacco Use  . Smoking status: Current Every Day Smoker    Packs/day: 1.00    Years: 41.00    Pack years: 41.00    Types: Cigarettes  . Smokeless tobacco: Never Used  . Tobacco comment: pt recommend to stop smoking 4 min spent will start on prn nicotine replacment  Substance and Sexual Activity  . Alcohol use: Yes    Alcohol/week: 0.0 standard drinks    Comment: weekends, couple beers, pint of liquer only on weekends.  Last drank approx 1 month ago.  . Drug use: No  . Sexual activity: Not Currently  Other Topics Concern  . Not on file  Social History Narrative   Lives with family    Social Determinants of Health   Financial Resource Strain:   . Difficulty of Paying Living Expenses:   Food Insecurity:   . Worried About Charity fundraiser in the Last Year:   . Arboriculturist in the Last Year:   Transportation Needs:   . Film/video editor (Medical):   Marland Kitchen Lack of Transportation (Non-Medical):   Physical Activity:   . Days of Exercise per Week:   . Minutes of Exercise per Session:   Stress:   . Feeling of Stress :   Social Connections:   . Frequency of Communication with Friends and Family:   . Frequency of Social Gatherings with Friends and Family:   . Attends Religious Services:   . Active Member of Clubs or Organizations:   . Attends Archivist Meetings:   Marland Kitchen Marital Status:    Intimate Partner Violence:   . Fear of Current or Ex-Partner:   . Emotionally Abused:   Marland Kitchen Physically Abused:   . Sexually Abused:       Review of Systems  Constitutional: Negative for chills, fatigue and unexpected weight change.  HENT: Negative for congestion, rhinorrhea, sneezing and sore throat.   Eyes: Negative for photophobia, pain and redness.  Respiratory: Negative for cough, chest tightness and shortness of breath.   Cardiovascular: Negative for chest pain and palpitations.  Gastrointestinal: Negative for abdominal pain, constipation, diarrhea, nausea and vomiting.  Endocrine: Negative.   Genitourinary: Negative for dysuria and frequency.  Musculoskeletal: Negative for arthralgias, back pain, joint swelling and neck pain.  Skin: Negative for rash.  Allergic/Immunologic: Negative.   Neurological: Negative for tremors and numbness.  Hematological: Negative for adenopathy. Does not bruise/bleed easily.  Psychiatric/Behavioral: Negative for behavioral problems and sleep disturbance. The patient is not nervous/anxious.     Vital Signs: BP 136/82   Pulse 69   Temp (!) 96.6 F (35.9 C)   Resp 16   Ht 5\' 4"  (1.626 m)   Wt 118 lb (53.5 kg)   LMP  (LMP Unknown)   SpO2 95%   BMI 20.25 kg/m    Physical Exam Vitals and nursing note reviewed.  Constitutional:      General: She is not in acute distress.    Appearance: She is well-developed. She is not diaphoretic.  HENT:     Head: Normocephalic and atraumatic.     Mouth/Throat:     Pharynx: No oropharyngeal exudate.  Eyes:     Pupils: Pupils are equal, round, and reactive to light.  Neck:     Thyroid: No thyromegaly.     Vascular: No JVD.     Trachea: No tracheal deviation.  Cardiovascular:     Rate and Rhythm: Normal rate and regular rhythm.     Heart sounds: Normal heart sounds. No murmur. No friction rub. No gallop.   Pulmonary:     Effort: Pulmonary effort is normal. No respiratory distress.     Breath  sounds: Normal breath sounds. No wheezing or rales.  Chest:     Chest wall: No tenderness.  Abdominal:     Palpations: Abdomen is soft.     Tenderness: There is no abdominal tenderness. There is no guarding.  Musculoskeletal:        General: Normal range of motion.     Cervical back: Normal range of motion and neck supple.  Lymphadenopathy:     Cervical: No cervical adenopathy.  Skin:    General: Skin is warm and dry.  Neurological:     Mental Status: She is alert and oriented to person, place, and time.     Cranial Nerves: No cranial nerve deficit.  Psychiatric:        Behavior: Behavior normal.        Thought Content: Thought content normal.        Judgment: Judgment normal.    Assessment/Plan: 1. Essential hypertension BP controlled. Continue Losartan as ordered..  - losartan (COZAAR) 50 MG tablet; Take 1 tablet (50 mg total) by mouth daily.  Dispense: 90 tablet; Refill: 1  2. Mixed hyperlipidemia Continue lipitor.  3. Chronic obstructive pulmonary disease, unspecified COPD type ([place]) Stable, good symptom management continue to follow.   4. Cigarette nicotine dependence with nicotine-induced disorder Smoking cessation counseling: 1. Pt acknowledges the risks of long term smoking, she will try to quite smoking. 2. Options for different medications including nicotine products, chewing gum, patch etc, Wellbutrin and Chantix is discussed 3. Goal and date of compete cessation is discussed 4. Total time spent in smoking cessation is 15 min.   General Counseling: Kami verbalizes understanding of the findings of todays visit and agrees with plan of treatment. I have discussed any further diagnostic evaluation that may be needed or ordered today. We also reviewed her medications today. she has been encouraged to call the office with any questions or concerns that should arise related to todays visit.    No orders of the defined types were placed in this encounter.   No  orders of the defined types were placed in this encounter.   Time spent: 30 Minutes  This patient was seen by Orson Gear AGNP-C in Collaboration with Dr Lavera Guise as a part of collaborative care agreement     Kendell Bane AGNP-C Internal medicine

## 2020-02-02 ENCOUNTER — Telehealth: Payer: Self-pay

## 2020-02-02 NOTE — Telephone Encounter (Signed)
Confirmed patient appt for 02/03/20

## 2020-02-03 ENCOUNTER — Ambulatory Visit (INDEPENDENT_AMBULATORY_CARE_PROVIDER_SITE_OTHER): Payer: Medicare Other | Admitting: Internal Medicine

## 2020-02-03 ENCOUNTER — Other Ambulatory Visit: Payer: Self-pay

## 2020-02-03 DIAGNOSIS — R0602 Shortness of breath: Secondary | ICD-10-CM

## 2020-02-03 DIAGNOSIS — J449 Chronic obstructive pulmonary disease, unspecified: Secondary | ICD-10-CM

## 2020-02-03 LAB — PULMONARY FUNCTION TEST

## 2020-02-07 ENCOUNTER — Other Ambulatory Visit: Payer: Self-pay | Admitting: Adult Health

## 2020-02-07 DIAGNOSIS — E782 Mixed hyperlipidemia: Secondary | ICD-10-CM

## 2020-02-07 NOTE — Procedures (Signed)
[facility] [place], [address]  DATE OF SERVICE: February 03, 2020  Complete Pulmonary Function Testing Interpretation:  FINDINGS:  Forced vital capacity is normal.  FEV1 is 1.31 L which is 64% of predicted and is moderately decreased.  FEV1 FVC ratio is moderately decreased.  Postbronchodilator there is no significant improvement in the FEV1 clinical improvement may still occur.  Total lung capacity is increased residual volume is increased residual in total lung capacity ratio is increased DLCO is mildly decreased  IMPRESSION:  This pulmonary function study is consistent with moderate obstructive lung disease.  No response to bronchodilators clinical correlation recommended  Allyne Gee, MD [facility] Pulmonary Critical Care Medicine Sleep Medicine

## 2020-02-08 DIAGNOSIS — J984 Other disorders of lung: Secondary | ICD-10-CM | POA: Diagnosis not present

## 2020-02-26 ENCOUNTER — Telehealth: Payer: Self-pay

## 2020-02-26 ENCOUNTER — Other Ambulatory Visit: Payer: Self-pay

## 2020-02-26 DIAGNOSIS — E782 Mixed hyperlipidemia: Secondary | ICD-10-CM

## 2020-02-26 NOTE — Telephone Encounter (Signed)
Pt stated she is taking Atorvastatin TAB and [facility] calls to let her know when its time to refill

## 2020-03-02 ENCOUNTER — Emergency Department: Payer: Medicare Other

## 2020-03-02 ENCOUNTER — Other Ambulatory Visit: Payer: Self-pay

## 2020-03-02 ENCOUNTER — Emergency Department
Admission: EM | Admit: 2020-03-02 | Discharge: 2020-03-02 | Disposition: A | Discharge status: Home / Self Care | Payer: Medicare Other | Attending: Emergency Medicine | Admitting: Emergency Medicine

## 2020-03-02 DIAGNOSIS — M4312 Spondylolisthesis, cervical region: Secondary | ICD-10-CM | POA: Diagnosis not present

## 2020-03-02 DIAGNOSIS — F17210 Nicotine dependence, cigarettes, uncomplicated: Secondary | ICD-10-CM | POA: Diagnosis not present

## 2020-03-02 DIAGNOSIS — I119 Hypertensive heart disease without heart failure: Secondary | ICD-10-CM | POA: Insufficient documentation

## 2020-03-02 DIAGNOSIS — J449 Chronic obstructive pulmonary disease, unspecified: Secondary | ICD-10-CM | POA: Diagnosis not present

## 2020-03-02 DIAGNOSIS — Z7982 Long term (current) use of aspirin: Secondary | ICD-10-CM | POA: Diagnosis not present

## 2020-03-02 DIAGNOSIS — R519 Headache, unspecified: Secondary | ICD-10-CM | POA: Diagnosis not present

## 2020-03-02 DIAGNOSIS — J45909 Unspecified asthma, uncomplicated: Secondary | ICD-10-CM | POA: Diagnosis not present

## 2020-03-02 DIAGNOSIS — M47812 Spondylosis without myelopathy or radiculopathy, cervical region: Secondary | ICD-10-CM | POA: Diagnosis not present

## 2020-03-02 DIAGNOSIS — E860 Dehydration: Secondary | ICD-10-CM

## 2020-03-02 DIAGNOSIS — Z8679 Personal history of other diseases of the circulatory system: Secondary | ICD-10-CM | POA: Insufficient documentation

## 2020-03-02 DIAGNOSIS — R404 Transient alteration of awareness: Secondary | ICD-10-CM | POA: Diagnosis not present

## 2020-03-02 DIAGNOSIS — I10 Essential (primary) hypertension: Secondary | ICD-10-CM | POA: Diagnosis not present

## 2020-03-02 DIAGNOSIS — R569 Unspecified convulsions: Secondary | ICD-10-CM | POA: Diagnosis not present

## 2020-03-02 DIAGNOSIS — Z743 Need for continuous supervision: Secondary | ICD-10-CM | POA: Diagnosis not present

## 2020-03-02 DIAGNOSIS — R4020 Unspecified coma: Secondary | ICD-10-CM | POA: Diagnosis not present

## 2020-03-02 LAB — COMPREHENSIVE METABOLIC PANEL
ALT: 22 U/L (ref 0–44)
AST: 34 U/L (ref 15–41)
Albumin: 3.8 g/dL (ref 3.5–5.0)
Alkaline Phosphatase: 42 U/L (ref 38–126)
Anion gap: 9 (ref 5–15)
BUN: 12 mg/dL (ref 8–23)
CO2: 23 mmol/L (ref 22–32)
Calcium: 8.9 mg/dL (ref 8.9–10.3)
Chloride: 107 mmol/L (ref 98–111)
Creatinine, Ser: 1.05 mg/dL — ABNORMAL HIGH (ref 0.44–1.00)
GFR calc Af Amer: >60 mL/min (ref >60)
GFR calc non Af Amer: 56 mL/min — ABNORMAL LOW (ref >60)
Glucose, Bld: 83 mg/dL (ref 70–99)
Potassium: 4.1 mmol/L (ref 3.5–5.1)
Sodium: 139 mmol/L (ref 135–145)
Total Bilirubin: 0.9 mg/dL (ref 0.3–1.2)
Total Protein: 6.9 g/dL (ref 6.5–8.1)

## 2020-03-02 LAB — CBC WITH DIFFERENTIAL/PLATELET
Abs Immature Granulocytes: 0.02 10*3/uL (ref 0.00–0.07)
Basophils Absolute: 0 10*3/uL (ref 0.0–0.1)
Basophils Relative: 0 %
Eosinophils Absolute: 0 10*3/uL (ref 0.0–0.5)
Eosinophils Relative: 1 %
HCT: 36.4 % (ref 36.0–46.0)
Hemoglobin: 12.4 g/dL (ref 12.0–15.0)
Immature Granulocytes: 1 %
Lymphocytes Relative: 24 %
Lymphs Abs: 0.9 10*3/uL (ref 0.7–4.0)
MCH: 33.2 pg (ref 26.0–34.0)
MCHC: 34.1 g/dL (ref 30.0–36.0)
MCV: 97.3 fL (ref 80.0–100.0)
Monocytes Absolute: 0.3 10*3/uL (ref 0.1–1.0)
Monocytes Relative: 9 %
Neutro Abs: 2.5 10*3/uL (ref 1.7–7.7)
Neutrophils Relative %: 65 %
Platelets: 132 10*3/uL — ABNORMAL LOW (ref 150–400)
RBC: 3.74 MIL/uL — ABNORMAL LOW (ref 3.87–5.11)
RDW: 14.4 % (ref 11.5–15.5)
WBC: 3.7 10*3/uL — ABNORMAL LOW (ref 4.0–10.5)
nRBC: 0 % (ref 0.0–0.2)

## 2020-03-02 LAB — TROPONIN I (HIGH SENSITIVITY)
Troponin I (High Sensitivity): 7 ng/L (ref <18)
Troponin I (High Sensitivity): 7 ng/L (ref <18)

## 2020-03-02 LAB — MAGNESIUM: Magnesium: 1.7 mg/dL (ref 1.7–2.4)

## 2020-03-02 LAB — ETHANOL: Alcohol, Ethyl (B): <10 mg/dL (ref <10)

## 2020-03-02 MED ORDER — SODIUM CHLORIDE 0.9 % IV BOLUS
1000.0000 mL | Freq: Once | INTRAVENOUS | Status: AC
  Administered 2020-03-02: 1000 mL via INTRAVENOUS

## 2020-03-02 NOTE — ED Triage Notes (Signed)
Patient arrived via EMS from home. At baseline patient is ambulatory and responsive. At this time patient is only responding to certain or specific questions. Patient fell back on bed from sitting position on bed and had seizure for about 1 minute. Fire department arrived and patient was declining going to ED and fell back on bed from sitting position on bed to supine and had another roughly 1 minute seizure. No loss of bowel or bladder, No oral trauma. Patient states her stomach hurts and agrees to Blood transfusion if needed.  Patient received 2mg  Midazolam IV and 579mL normal saline

## 2020-03-02 NOTE — ED Notes (Signed)
Ct reports pt having a seizure. Pt brought back to room and no shaking or seizure-activity noted. Pt awoken to moving of covers. When asked if pt was OK, pt gave strange look and closed her eyes again.
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
## 2020-03-02 NOTE — ED Notes (Signed)
Patient provided meal and oral fluids for consumption.

## 2020-03-08 ENCOUNTER — Telehealth: Payer: Self-pay

## 2020-03-08 NOTE — Telephone Encounter (Signed)
Confirmed and screened for 03-09-20 ov. °

## 2020-03-09 ENCOUNTER — Other Ambulatory Visit: Payer: Self-pay

## 2020-03-09 ENCOUNTER — Ambulatory Visit (INDEPENDENT_AMBULATORY_CARE_PROVIDER_SITE_OTHER): Payer: Medicare Other | Admitting: Internal Medicine

## 2020-03-09 ENCOUNTER — Encounter: Payer: Self-pay | Admitting: Internal Medicine

## 2020-03-09 VITALS — BP 132/84 | HR 63 | Temp 97.7°F | Resp 16 | Ht 64.0 in | Wt 119.0 lb

## 2020-03-09 DIAGNOSIS — I70213 Atherosclerosis of native arteries of extremities with intermittent claudication, bilateral legs: Secondary | ICD-10-CM | POA: Diagnosis not present

## 2020-03-09 DIAGNOSIS — E782 Mixed hyperlipidemia: Secondary | ICD-10-CM

## 2020-03-09 DIAGNOSIS — F10280 Alcohol dependence with alcohol-induced anxiety disorder: Secondary | ICD-10-CM

## 2020-03-09 DIAGNOSIS — I10 Essential (primary) hypertension: Secondary | ICD-10-CM | POA: Diagnosis not present

## 2020-03-09 DIAGNOSIS — J449 Chronic obstructive pulmonary disease, unspecified: Secondary | ICD-10-CM

## 2020-03-09 MED ORDER — PNEUMOVAX 23 25 MCG/0.5ML IJ INJ
0.5000 mL | INJECTION | INTRAMUSCULAR | 0 refills | Status: AC
Start: 2020-03-09 — End: 2020-03-09

## 2020-03-09 MED ORDER — ESCITALOPRAM OXALATE 10 MG PO TABS
10.0000 mg | ORAL_TABLET | Freq: Every day | ORAL | 1 refills | Status: DC
Start: 2020-03-09 — End: 2020-04-06

## 2020-03-09 NOTE — Progress Notes (Signed)
[facility] [place],  [address]  Internal MEDICINE  Office Visit Note  Patient Name: [name]  [id number]  [id number]  Date of Service: 03/09/2020  Chief Complaint  Patient presents with  . Follow-up    ER Follow Up  . Hyperlipidemia  . Hypertension  . COPD  . Arthritis  . Asthma  . Quality Metric Gaps    Tetanus Vaccine    HPI Pt is here for follow up after visit to ER, she felt dizzy and almost passed out. Initial work up is negative. She does consume alcohol on regular basis. She does have h/o CAD, PVD and carotid occlusion, followed by vascular. She does have severe pulmonary HTN on echocardiogram. Does feel anxious all the time, continues to smoke as well    Current Medication: Outpatient Encounter Medications as of 03/09/2020  Medication Sig  . albuterol (VENTOLIN HFA) 108 (90 Base) MCG/ACT inhaler Inhale 2 puffs into the lungs every 6 (six) hours as needed for wheezing or shortness of breath.  Marland Kitchen aspirin EC 81 MG EC tablet Take 1 tablet (81 mg total) by mouth daily.  Marland Kitchen atorvastatin (LIPITOR) 80 MG tablet TAKE 1 TABLET BY MOUTH IN THE MORNING . APPOINTMENT REQUIRED FOR FUTURE REFILLS  . budesonide-formoterol (SYMBICORT) 80-4.5 MCG/ACT inhaler Inhale 2 puffs into the lungs daily.  . cetirizine (ZYRTEC) 10 MG tablet Take 10 mg by mouth daily.  . cholecalciferol (VITAMIN D) 400 units TABS tablet Take 400 Units by mouth.  . ferrous sulfate 325 (65 FE) MG tablet Take 325 mg by mouth daily.  Marland Kitchen losartan (COZAAR) 50 MG tablet Take 1 tablet (50 mg total) by mouth daily.  Marland Kitchen escitalopram (LEXAPRO) 10 MG tablet Take 1 tablet (10 mg total) by mouth daily. For anxiety and depression   No facility-administered encounter medications on file as of 03/09/2020.    Surgical History: Past Surgical History:  Procedure Laterality Date  . ABDOMINAL HYSTERECTOMY    . APPENDECTOMY    . CARDIAC CATHETERIZATION    . CARDIAC SURGERY    . CHOLECYSTECTOMY     . COLONOSCOPY  12/15/11   OH->bleeding internal hemorrhoids, otherwise normal  . COLONOSCOPY WITH PROPOFOL N/A 04/12/2015   Procedure: COLONOSCOPY WITH PROPOFOL;  Surgeon: Lucilla Lame, MD;  Location: [facility] ENDOSCOPY;  Service: Endoscopy;  Laterality: N/A;  . COLONOSCOPY WITH PROPOFOL N/A 01/05/2020   Procedure: COLONOSCOPY WITH PROPOFOL;  Surgeon: Lucilla Lame, MD;  Location: [facility] ENDOSCOPY;  Service: Endoscopy;  Laterality: N/A;  . ENDARTERECTOMY FEMORAL Left 04/26/2015   Procedure: ENDARTERECTOMY FEMORAL;  Surgeon: Algernon Huxley, MD;  Location: [facility] ORS;  Service: Vascular;  Laterality: Left;  . ENDOVASCULAR STENT INSERTION Bilateral    Legs  . FLEXIBLE BRONCHOSCOPY N/A 02/02/2015   Procedure: FLEXIBLE BRONCHOSCOPY;  Surgeon: Allyne Gee, MD;  Location: [facility] ORS;  Service: Pulmonary;  Laterality: N/A;  . HEMORRHOID SURGERY    . PERIPHERAL VASCULAR CATHETERIZATION Left 04/25/2015   Procedure: Lower Extremity Angiography;  Surgeon: Algernon Huxley, MD;  Location: [place] CV LAB;  Service: Cardiovascular;  Laterality: Left;  . PERIPHERAL VASCULAR CATHETERIZATION Left 04/25/2015   Procedure: Lower Extremity Intervention;  Surgeon: Algernon Huxley, MD;  Location: [place] CV LAB;  Service: Cardiovascular;  Laterality: Left;  . PERIPHERAL VASCULAR CATHETERIZATION Left 01/02/2016   Procedure: Lower Extremity Angiography;  Surgeon: Algernon Huxley, MD;  Location: [place] CV LAB;  Service: Cardiovascular;  Laterality: Left;  . PERIPHERAL VASCULAR CATHETERIZATION  01/02/2016  Procedure: Lower Extremity Intervention;  Surgeon: Algernon Huxley, MD;  Location: [place] CV LAB;  Service: Cardiovascular;;  . THROMBECTOMY FEMORAL ARTERY Left 04/26/2015   Procedure: THROMBECTOMY FEMORAL ARTERY;  Surgeon: Algernon Huxley, MD;  Location: [facility] ORS;  Service: Vascular;  Laterality: Left;    Medical History: Past Medical History:  Diagnosis Date  . Allergy    Environmental  . Arthritis   . Asthma   . Carotid artery  stenosis   . Carotid stenosis   . COPD (chronic obstructive pulmonary disease) ([place])   . Coronary artery disease   . Coronary artery disease   . Hemorrhoids   . Hyperlipidemia   . Hypertension   . 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[facility])    negative cardiac cath  . Nicotine addiction   . Peripheral vascular disease ([place])   . Pneumonia   . Shortness of breath dyspnea   . Tobacco abuse     Family History: Family History  Problem Relation Age of Onset  . Hypertension Mother   . Hypertension Father     Social History   Socioeconomic History  . Marital status: Single    Spouse name: Not on file  . Number of children: Not on file  . Years of education: Not on file  . Highest education level: Not on file  Occupational History  . Not on file  Tobacco Use  . Smoking status: Current Every Day Smoker    Packs/day: 1.00    Years: 41.00    Pack years: 41.00    Types: Cigarettes  . Smokeless tobacco: Never Used  . Tobacco comment: pt recommend to stop smoking 4 min spent will start on prn nicotine replacment  Vaping Use  . Vaping Use: Never used  Substance and Sexual Activity  . Alcohol use: Yes    Alcohol/week: 0.0 standard drinks    Comment: weekends, couple beers, pint of liquer only on weekends.  Last drank approx 1 month ago.  . Drug use: No  . Sexual activity: Not Currently  Other Topics Concern  . Not on file  Social History Narrative   Lives with family    Social Determinants of Health   Financial Resource Strain:   . Difficulty of Paying Living Expenses:   Food Insecurity:   . Worried About Charity fundraiser in the Last Year:   . Arboriculturist in the Last Year:   Transportation Needs:   . Film/video editor (Medical):   Marland Kitchen Lack of Transportation (Non-Medical):   Physical Activity:   . Days of Exercise per Week:   . Minutes of Exercise per Session:   Stress:   . Feeling of Stress :   Social Connections:   . Frequency of Communication with Friends and Family:    . Frequency of Social Gatherings with Friends and Family:   . Attends Religious Services:   . Active Member of Clubs or Organizations:   . Attends Archivist Meetings:   Marland Kitchen Marital Status:   Intimate Partner Violence:   . Fear of Current or Ex-Partner:   . Emotionally Abused:   Marland Kitchen Physically Abused:   . Sexually Abused:     Review of Systems  Constitutional: Negative for chills, diaphoresis and fatigue.  HENT: Negative for ear pain, postnasal drip and sinus pressure.   Eyes: Negative for photophobia, discharge, redness, itching and visual disturbance.  Respiratory: Negative for cough, shortness of breath and wheezing.   Cardiovascular: Negative for chest pain, palpitations  and leg swelling.  Gastrointestinal: Negative for abdominal pain, constipation, diarrhea, nausea and vomiting.  Genitourinary: Negative for dysuria and flank pain.  Musculoskeletal: Negative for arthralgias, back pain, gait problem and neck pain.  Skin: Negative for color change.  Allergic/Immunologic: Negative for environmental allergies and food allergies.  Neurological: Positive for syncope and weakness. Negative for dizziness and headaches.  Hematological: Does not bruise/bleed easily.  Psychiatric/Behavioral: Positive for dysphoric mood. Negative for agitation, behavioral problems (depression) and hallucinations. The patient is nervous/anxious.     Vital Signs: BP 132/84   Pulse 63   Temp 97.7 F (36.5 C)   Resp 16   Ht 5\' 4"  (1.626 m)   Wt 119 lb (54 kg)   LMP  (LMP Unknown)   SpO2 92%   BMI 20.43 kg/m    Physical Exam Constitutional:      General: She is not in acute distress.    Appearance: She is well-developed. She is not diaphoretic.  HENT:     Head: Normocephalic and atraumatic.     Mouth/Throat:     Pharynx: No oropharyngeal exudate.  Eyes:     Pupils: Pupils are equal, round, and reactive to light.  Neck:     Thyroid: No thyromegaly.     Vascular: No JVD.     Trachea:  No tracheal deviation.  Cardiovascular:     Rate and Rhythm: Normal rate and regular rhythm.     Heart sounds: Normal heart sounds. No murmur heard.  No friction rub. No gallop.   Pulmonary:     Effort: Pulmonary effort is normal. No respiratory distress.     Breath sounds: No wheezing or rales.  Chest:     Chest wall: No tenderness.  Abdominal:     General: Bowel sounds are normal.     Palpations: Abdomen is soft.  Musculoskeletal:        General: Normal range of motion.     Cervical back: Normal range of motion and neck supple.  Lymphadenopathy:     Cervical: No cervical adenopathy.  Skin:    General: Skin is warm and dry.  Neurological:     Mental Status: She is alert and oriented to person, place, and time.     Cranial Nerves: No cranial nerve deficit.  Psychiatric:        Behavior: Behavior normal.        Thought Content: Thought content normal.        Judgment: Judgment normal.    Assessment/Plan: 1. Alcohol dependence with alcohol-induced anxiety disorder (HCC) - Add Lexapro 10 mg qd  - B12 and Folate Panel - Vitamin B1  2. Chronic obstructive pulmonary disease, unspecified COPD type ([place]) - Per pulmonary, continue all inhalers , check for hypoxia   3. Essential hypertension - Will monitor, might have to decrease her meds if continues to have syncopal episodes,   4. Mixed hyperlipidemia - Continue Lipitor   5. Atherosclerosis of native artery of both lower extremities with intermittent claudication [facility]) - Per Vascular  General Counseling: Charlesa verbalizes understanding of the findings of todays visit and agrees with plan of treatment. I have discussed any further diagnostic evaluation that may be needed or ordered today. We also reviewed her medications today. she has been encouraged to call the office with any questions or concerns that should arise related to todays visit.   Orders Placed This Encounter  Procedures  . B12 and Folate Panel  . Vitamin B1     Meds ordered  this encounter  Medications  . escitalopram (LEXAPRO) 10 MG tablet    Sig: Take 1 tablet (10 mg total) by mouth daily. For anxiety and depression    Dispense:  30 tablet    Refill:  1    Total time spent: 35 Minutes Time spent includes review of chart, medications, test results, and follow up plan with the patient.    Dr Lavera Guise Internal medicine

## 2020-03-25 ENCOUNTER — Other Ambulatory Visit: Payer: Self-pay

## 2020-03-25 DIAGNOSIS — E782 Mixed hyperlipidemia: Secondary | ICD-10-CM

## 2020-04-04 ENCOUNTER — Telehealth: Payer: Self-pay

## 2020-04-04 NOTE — Telephone Encounter (Signed)
Confirmed and screened for 04-06-20 ov.

## 2020-04-05 LAB — B12 AND FOLATE PANEL
Folate: 9.7 ng/mL (ref >3.0)
Vitamin B-12: 455 pg/mL (ref 232–1245)

## 2020-04-05 LAB — VITAMIN B1: Thiamine: 156.8 nmol/L (ref 66.5–200.0)

## 2020-04-06 ENCOUNTER — Ambulatory Visit (INDEPENDENT_AMBULATORY_CARE_PROVIDER_SITE_OTHER): Payer: Medicare Other | Admitting: Internal Medicine

## 2020-04-06 ENCOUNTER — Other Ambulatory Visit: Payer: Self-pay

## 2020-04-06 ENCOUNTER — Encounter: Payer: Self-pay | Admitting: Internal Medicine

## 2020-04-06 VITALS — BP 150/98 | HR 74 | Temp 97.8°F | Resp 16 | Ht 64.0 in | Wt 118.0 lb

## 2020-04-06 DIAGNOSIS — E782 Mixed hyperlipidemia: Secondary | ICD-10-CM | POA: Diagnosis not present

## 2020-04-06 DIAGNOSIS — R911 Solitary pulmonary nodule: Secondary | ICD-10-CM

## 2020-04-06 DIAGNOSIS — F17209 Nicotine dependence, unspecified, with unspecified nicotine-induced disorders: Secondary | ICD-10-CM

## 2020-04-06 DIAGNOSIS — F10280 Alcohol dependence with alcohol-induced anxiety disorder: Secondary | ICD-10-CM

## 2020-04-06 DIAGNOSIS — Z716 Tobacco abuse counseling: Secondary | ICD-10-CM | POA: Diagnosis not present

## 2020-04-06 MED ORDER — ATORVASTATIN CALCIUM 80 MG PO TABS: 80.0000 mg | ORAL_TABLET | Freq: Every day | ORAL | 3 refills | Status: DC

## 2020-04-06 MED ORDER — REPATHA 140 MG/ML ~~LOC~~ SOSY: PREFILLED_SYRINGE | SUBCUTANEOUS | 0 refills | Status: DC

## 2020-04-06 NOTE — Progress Notes (Deleted)
[facility] [place], [place] [address]  Internal MEDICINE  Office Visit Note  Patient Name: [name]  [id number]  [id number]  Date of Service: 04/18/2020  Chief Complaint  Patient presents with  . Follow-up  . Hyperlipidemia  . Hypertension  . Quality Metric Gaps    TDAP    HPI  Pt is here for routine follow up. She was started on Lexapro for anxiety and alcohol dependence, she said she did not take medicine because she is not drinking that much and has better control over things. She will need follow up on pulmonary nodules before seen by Pulmonary. Continues to see cardio and Vascular. Unfortunately she continues to smoke, BP is elevated today as well   Current Medication: Outpatient Encounter Medications as of 04/06/2020  Medication Sig  . albuterol (VENTOLIN HFA) 108 (90 Base) MCG/ACT inhaler Inhale 2 puffs into the lungs every 6 (six) hours as needed for wheezing or shortness of breath.  Marland Kitchen aspirin EC 81 MG EC tablet Take 1 tablet (81 mg total) by mouth daily.  . budesonide-formoterol (SYMBICORT) 80-4.5 MCG/ACT inhaler Inhale 2 puffs into the lungs daily.  . cetirizine (ZYRTEC) 10 MG tablet Take 10 mg by mouth daily.  . cholecalciferol (VITAMIN D) 400 units TABS tablet Take 400 Units by mouth.  . ferrous sulfate 325 (65 FE) MG tablet Take 325 mg by mouth daily.  Marland Kitchen losartan (COZAAR) 50 MG tablet Take 1 tablet (50 mg total) by mouth daily.  . [DISCONTINUED] atorvastatin (LIPITOR) 80 MG tablet TAKE 1 TABLET BY MOUTH IN THE MORNING . APPOINTMENT REQUIRED FOR FUTURE REFILLS (Patient taking differently: Take 80 mg by mouth daily. )  . [DISCONTINUED] escitalopram (LEXAPRO) 10 MG tablet Take 1 tablet (10 mg total) by mouth daily. For anxiety and depression  . atorvastatin (LIPITOR) 80 MG tablet Take 1 tablet (80 mg total) by mouth daily.  . Evolocumab (REPATHA) 140 MG/ML SOSY Use as directed by cardiology   No facility-administered encounter  medications on file as of 04/06/2020.    Surgical History: Past Surgical History:  Procedure Laterality Date  . ABDOMINAL HYSTERECTOMY    . APPENDECTOMY    . CARDIAC CATHETERIZATION    . CARDIAC SURGERY    . CHOLECYSTECTOMY    . COLONOSCOPY  12/15/11   OH->bleeding internal hemorrhoids, otherwise normal  . COLONOSCOPY WITH PROPOFOL N/A 04/12/2015   Procedure: COLONOSCOPY WITH PROPOFOL;  Surgeon: Lucilla Lame, MD;  Location: [facility] ENDOSCOPY;  Service: Endoscopy;  Laterality: N/A;  . COLONOSCOPY WITH PROPOFOL N/A 01/05/2020   Procedure: COLONOSCOPY WITH PROPOFOL;  Surgeon: Lucilla Lame, MD;  Location: [facility] ENDOSCOPY;  Service: Endoscopy;  Laterality: N/A;  . ENDARTERECTOMY FEMORAL Left 04/26/2015   Procedure: ENDARTERECTOMY FEMORAL;  Surgeon: Algernon Huxley, MD;  Location: [facility] ORS;  Service: Vascular;  Laterality: Left;  . ENDOVASCULAR STENT INSERTION Bilateral    Legs  . FLEXIBLE BRONCHOSCOPY N/A 02/02/2015   Procedure: FLEXIBLE BRONCHOSCOPY;  Surgeon: Allyne Gee, MD;  Location: [facility] ORS;  Service: Pulmonary;  Laterality: N/A;  . HEMORRHOID SURGERY    . PERIPHERAL VASCULAR CATHETERIZATION Left 04/25/2015   Procedure: Lower Extremity Angiography;  Surgeon: Algernon Huxley, MD;  Location: [place] CV LAB;  Service: Cardiovascular;  Laterality: Left;  . PERIPHERAL VASCULAR CATHETERIZATION Left 04/25/2015   Procedure: Lower Extremity Intervention;  Surgeon: Algernon Huxley, MD;  Location: [place] CV LAB;  Service: Cardiovascular;  Laterality: Left;  . PERIPHERAL VASCULAR CATHETERIZATION Left 01/02/2016   Procedure:  Lower Extremity Angiography;  Surgeon: Algernon Huxley, MD;  Location: [place] CV LAB;  Service: Cardiovascular;  Laterality: Left;  . PERIPHERAL VASCULAR CATHETERIZATION  01/02/2016   Procedure: Lower Extremity Intervention;  Surgeon: Algernon Huxley, MD;  Location: [place] CV LAB;  Service: Cardiovascular;;  . THROMBECTOMY FEMORAL ARTERY Left 04/26/2015   Procedure: THROMBECTOMY FEMORAL  ARTERY;  Surgeon: Algernon Huxley, MD;  Location: [facility] ORS;  Service: Vascular;  Laterality: Left;    Medical History: Past Medical History:  Diagnosis Date  . Allergy    Environmental  . Arthritis   . Asthma   . Carotid artery stenosis   . Carotid stenosis   . COPD (chronic obstructive pulmonary disease) ([place])   . Coronary artery disease   . Coronary artery disease   . Hemorrhoids   . Hyperlipidemia   . Hypertension   . 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[facility])    negative cardiac cath  . Nicotine addiction   . Peripheral vascular disease ([place])   . Pneumonia   . Shortness of breath dyspnea   . Tobacco abuse     Family History: Family History  Problem Relation Age of Onset  . Hypertension Mother   . Hypertension Father     Social History   Socioeconomic History  . Marital status: Single    Spouse name: Not on file  . Number of children: Not on file  . Years of education: Not on file  . Highest education level: Not on file  Occupational History  . Not on file  Tobacco Use  . Smoking status: Current Every Day Smoker    Packs/day: 1.00    Years: 41.00    Pack years: 41.00    Types: Cigarettes  . Smokeless tobacco: Never Used  . Tobacco comment: pt recommend to stop smoking 4 min spent will start on prn nicotine replacment  Vaping Use  . Vaping Use: Never used  Substance and Sexual Activity  . Alcohol use: Yes    Alcohol/week: 0.0 standard drinks    Comment: weekends, couple beers, pint of liquer only on weekends.  Last drank approx 1 month ago.  . Drug use: No  . Sexual activity: Not Currently  Other Topics Concern  . Not on file  Social History Narrative   Lives with family    Social Determinants of Health   Financial Resource Strain:   . Difficulty of Paying Living Expenses:   Food Insecurity:   . Worried About Charity fundraiser in the Last Year:   . Arboriculturist in the Last Year:   Transportation Needs:   . Film/video editor (Medical):   Marland Kitchen Lack of  Transportation (Non-Medical):   Physical Activity:   . Days of Exercise per Week:   . Minutes of Exercise per Session:   Stress:   . Feeling of Stress :   Social Connections:   . Frequency of Communication with Friends and Family:   . Frequency of Social Gatherings with Friends and Family:   . Attends Religious Services:   . Active Member of Clubs or Organizations:   . Attends Archivist Meetings:   Marland Kitchen Marital Status:   Intimate Partner Violence:   . Fear of Current or Ex-Partner:   . Emotionally Abused:   Marland Kitchen Physically Abused:   . Sexually Abused:     Review of Systems  Constitutional: Negative for chills, diaphoresis and fatigue.  HENT: Negative for ear pain, postnasal drip and sinus pressure.  Eyes: Negative for photophobia, discharge, redness, itching and visual disturbance.  Respiratory: Negative for cough, shortness of breath and wheezing.   Cardiovascular: Negative for chest pain, palpitations and leg swelling.  Gastrointestinal: Negative for abdominal pain, constipation, diarrhea, nausea and vomiting.  Genitourinary: Negative for dysuria and flank pain.  Musculoskeletal: Negative for arthralgias, back pain, gait problem and neck pain.  Skin: Negative for color change.  Allergic/Immunologic: Negative for environmental allergies and food allergies.  Neurological: Negative for dizziness and headaches.  Hematological: Does not bruise/bleed easily.  Psychiatric/Behavioral: Positive for dysphoric mood. Negative for agitation, behavioral problems (depression) and hallucinations.    Vital Signs: BP (!) 150/98   Pulse 74   Temp 97.8 F (36.6 C)   Resp 16   Ht 5\' 4"  (1.626 m)   Wt 118 lb (53.5 kg)   LMP  (LMP Unknown)   SpO2 97%   BMI 20.25 kg/m    Physical Exam Constitutional:      General: She is not in acute distress.    Appearance: She is well-developed. She is not diaphoretic.  HENT:     Head: Normocephalic and atraumatic.     Mouth/Throat:      Pharynx: No oropharyngeal exudate.  Eyes:     Pupils: Pupils are equal, round, and reactive to light.  Neck:     Thyroid: No thyromegaly.     Vascular: No JVD.     Trachea: No tracheal deviation.  Cardiovascular:     Rate and Rhythm: Normal rate and regular rhythm.     Heart sounds: Normal heart sounds. No murmur heard.  No friction rub. No gallop.   Pulmonary:     Effort: Pulmonary effort is normal. No respiratory distress.     Breath sounds: No wheezing or rales.  Chest:     Chest wall: No tenderness.  Abdominal:     General: Bowel sounds are normal.     Palpations: Abdomen is soft.  Musculoskeletal:        General: Normal range of motion.     Cervical back: Normal range of motion and neck supple.  Lymphadenopathy:     Cervical: No cervical adenopathy.  Skin:    General: Skin is warm and dry.  Neurological:     Mental Status: She is alert and oriented to person, place, and time.     Cranial Nerves: No cranial nerve deficit.  Psychiatric:        Behavior: Behavior normal.        Thought Content: Thought content normal.        Judgment: Judgment normal.     Assessment/Plan: 1. Pulmonary nodule - Pt needs follow up to assess for pulmonary nodules  - CT CHEST LUNG CA SCREEN LOW DOSE W/O CM; Future  2. Tobacco use disorder, continuous - Encouraged and counseled on smoking cessation  - CT CHEST LUNG CA SCREEN LOW DOSE W/O CM; Future  3. Tobacco abuse counseling - Smoking cessation counseling: 1. Pt acknowledges the risks of long term smoking, she will try to quite smoking. 2. Options for different medications including nicotine products, chewing gum, patch etc, Wellbutrin and Chantix is discussed 3. Goal and date of compete cessation is discussed 4. Total time spent in smoking cessation is 10 min.   4. Alcohol dependence with alcohol-induced anxiety disorder ([place]) - Pt does not want to try and medications to help with this   5. Mixed hyperlipidemia - Continue  current therapy  - Evolocumab (REPATHA) 140 MG/ML SOSY; Use  as directed by cardiology  Dispense: 2.1 mL; Refill: 0 - atorvastatin (LIPITOR) 80 MG tablet; Take 1 tablet (80 mg total) by mouth daily.  Dispense: 90 tablet; Refill: 3  General Counseling: Amiera verbalizes understanding of the findings of todays visit and agrees with plan of treatment. I have discussed any further diagnostic evaluation that may be needed or ordered today. We also reviewed her medications today. she has been encouraged to call the office with any questions or concerns that should arise related to todays visit.    Orders Placed This Encounter  Procedures  . CT Head Wo Contrast  . CT CHEST LUNG CA SCREEN LOW DOSE W/O CM    Meds ordered this encounter  Medications  . Evolocumab (REPATHA) 140 MG/ML SOSY    Sig: Use as directed by cardiology    Dispense:  2.1 mL    Refill:  0  . atorvastatin (LIPITOR) 80 MG tablet    Sig: Take 1 tablet (80 mg total) by mouth daily.    Dispense:  90 tablet    Refill:  3    Total time spent:35 Minutes Time spent includes review of chart, medications, test results, and follow up plan with the patient.      Dr Lavera Guise Internal medicine

## 2020-04-15 DIAGNOSIS — F17210 Nicotine dependence, cigarettes, uncomplicated: Secondary | ICD-10-CM | POA: Diagnosis not present

## 2020-04-15 DIAGNOSIS — E785 Hyperlipidemia, unspecified: Secondary | ICD-10-CM | POA: Diagnosis not present

## 2020-04-15 DIAGNOSIS — I10 Essential (primary) hypertension: Secondary | ICD-10-CM | POA: Diagnosis not present

## 2020-04-15 DIAGNOSIS — I2510 Atherosclerotic heart disease of native coronary artery without angina pectoris: Secondary | ICD-10-CM | POA: Diagnosis not present

## 2020-04-15 DIAGNOSIS — I4891 Unspecified atrial fibrillation: Secondary | ICD-10-CM | POA: Diagnosis not present

## 2020-04-18 NOTE — Progress Notes (Signed)
[facility] [place], [place] [address]  Internal MEDICINE  Office Visit Note  Patient Name: [name]  [id number]  [id number]  Date of Service: 04/18/2020  Chief Complaint  Patient presents with  . Follow-up  . Hyperlipidemia  . Hypertension  . Quality Metric Gaps    TDAP    HPI Pt is here for routine follow up, she was started on Lexapro for GAD and alcohol dependence disorder however pt stopped taking it, she said she is trying herself and is in better mental condition, unfortunately she continues to smoke as well, has severe atherosclerotic disease, bp is elevated today, she has not taken her meds for today. Pt will be due for CT follow up on pulmonary nodule      Current Medication: Outpatient Encounter Medications as of 04/06/2020  Medication Sig  . albuterol (VENTOLIN HFA) 108 (90 Base) MCG/ACT inhaler Inhale 2 puffs into the lungs every 6 (six) hours as needed for wheezing or shortness of breath.  Marland Kitchen aspirin EC 81 MG EC tablet Take 1 tablet (81 mg total) by mouth daily.  . budesonide-formoterol (SYMBICORT) 80-4.5 MCG/ACT inhaler Inhale 2 puffs into the lungs daily.  . cetirizine (ZYRTEC) 10 MG tablet Take 10 mg by mouth daily.  . cholecalciferol (VITAMIN D) 400 units TABS tablet Take 400 Units by mouth.  . ferrous sulfate 325 (65 FE) MG tablet Take 325 mg by mouth daily.  Marland Kitchen losartan (COZAAR) 50 MG tablet Take 1 tablet (50 mg total) by mouth daily.  . [DISCONTINUED] atorvastatin (LIPITOR) 80 MG tablet TAKE 1 TABLET BY MOUTH IN THE MORNING . APPOINTMENT REQUIRED FOR FUTURE REFILLS (Patient taking differently: Take 80 mg by mouth daily. )  . [DISCONTINUED] escitalopram (LEXAPRO) 10 MG tablet Take 1 tablet (10 mg total) by mouth daily. For anxiety and depression  . atorvastatin (LIPITOR) 80 MG tablet Take 1 tablet (80 mg total) by mouth daily.  . Evolocumab (REPATHA) 140 MG/ML SOSY Use as directed by cardiology   No facility-administered encounter  medications on file as of 04/06/2020.    Surgical History: Past Surgical History:  Procedure Laterality Date  . ABDOMINAL HYSTERECTOMY    . APPENDECTOMY    . CARDIAC CATHETERIZATION    . CARDIAC SURGERY    . CHOLECYSTECTOMY    . COLONOSCOPY  12/15/11   OH->bleeding internal hemorrhoids, otherwise normal  . COLONOSCOPY WITH PROPOFOL N/A 04/12/2015   Procedure: COLONOSCOPY WITH PROPOFOL;  Surgeon: Lucilla Lame, MD;  Location: [facility] ENDOSCOPY;  Service: Endoscopy;  Laterality: N/A;  . COLONOSCOPY WITH PROPOFOL N/A 01/05/2020   Procedure: COLONOSCOPY WITH PROPOFOL;  Surgeon: Lucilla Lame, MD;  Location: [facility] END[facility]COPY;  Service: Endoscopy;  Laterality: N/A;  . ENDARTERECTOMY FEMORAL Left 04/26/2015   Procedure: ENDARTERECTOMY FEMORAL;  Surgeon: Algernon Huxley, MD;  Location: [facility] ORS;  Service: Vascular;  Laterality: Left;  . ENDOVASCULAR STENT INSERTION Bilateral    Legs  . FLEXIBLE BRONCHOSCOPY N/A 02/02/2015   Procedure: FLEXIBLE BRONCHOSCOPY;  Surgeon: Allyne Gee, MD;  Location: [facility] ORS;  Service: Pulmonary;  Laterality: N/A;  . HEMORRHOID SURGERY    . PERIPHERAL VASCULAR CATHETERIZATION Left 04/25/2015   Procedure: Lower Extremity Angiography;  Surgeon: Algernon Huxley, MD;  Location: [place] [facility];  Service: Cardiovascular;  Laterality: Left;  . PERIPHERAL VASCULAR CATHETERIZATION Left 04/25/2015   Procedure: Lower Extremity Intervention;  Surgeon: Algernon Huxley, MD;  Location: [place] [facility];  Service: Cardiovascular;  Laterality: Left;  . PERIPHERAL VASCULAR CATHETERIZATION  Left 01/02/2016   Procedure: Lower Extremity Angiography;  Surgeon: Algernon Huxley, MD;  Location: [place] [facility];  Service: Cardiovascular;  Laterality: Left;  . PERIPHERAL VASCULAR CATHETERIZATION  01/02/2016   Procedure: Lower Extremity Intervention;  Surgeon: Algernon Huxley, MD;  Location: [place] [facility];  Service: Cardiovascular;;  . THROMBECTOMY FEMORAL ARTERY Left 04/26/2015   Procedure: THROMBECTOMY FEMORAL  ARTERY;  Surgeon: Algernon Huxley, MD;  Location: [facility] ORS;  Service: Vascular;  Laterality: Left;    Medical History: Past Medical History:  Diagnosis Date  . Allergy    Environmental  . Arthritis   . Asthma   . Carotid artery stenosis   . Carotid stenosis   . COPD (chronic obstructive pulmonary disease) ([place])   . Coronary artery disease   . Coronary artery disease   . Hemorrhoids   . Hyperlipidemia   . Hypertension   . 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[facility])    negative cardiac cath  . Nicotine addiction   . Peripheral vascular disease ([place])   . Pneumonia   . Shortness of breath dyspnea   . Tobacco abuse     Family History: Family History  Problem Relation Age of Onset  . Hypertension Mother   . Hypertension Father     Social History   Socioeconomic History  . Marital status: Single    Spouse name: Not on file  . Number of children: Not on file  . Years of education: Not on file  . Highest education level: Not on file  Occupational History  . Not on file  Tobacco Use  . Smoking status: Current Every Day Smoker    Packs/day: 1.00    Years: 41.00    Pack years: 41.00    Types: Cigarettes  . Smokeless tobacco: Never Used  . Tobacco comment: pt recommend to stop smoking 4 min spent will start on prn nicotine replacment  Vaping Use  . Vaping Use: Never used  Substance and Sexual Activity  . Alcohol use: Yes    Alcohol/week: 0.0 standard drinks    Comment: weekends, couple beers, pint of liquer only on weekends.  Last drank approx 1 month ago.  . Drug use: No  . Sexual activity: Not Currently  Other Topics Concern  . Not on file  Social History Narrative   Lives with family    Social Determinants of Health   Financial Resource Strain:   . Difficulty of Paying Living Expenses:   Food Insecurity:   . Worried About Charity fundraiser in the Last Year:   . Arboriculturist in the Last Year:   Transportation Needs:   . Film/video editor (Medical):   Marland Kitchen Lack of  Transportation (Non-Medical):   Physical Activity:   . Days of Exercise per Week:   . Minutes of Exercise per Session:   Stress:   . Feeling of Stress :   Social Connections:   . Frequency of Communication with Friends and Family:   . Frequency of Social Gatherings with Friends and Family:   . Attends Religious Services:   . Active Member of Clubs or Organizations:   . Attends Archivist Meetings:   Marland Kitchen Marital Status:   Intimate Partner Violence:   . Fear of Current or Ex-Partner:   . Emotionally Abused:   Marland Kitchen Physically Abused:   . Sexually Abused:       Review of Systems  Constitutional: Negative for chills, diaphoresis and fatigue.  HENT: Negative for  ear pain, postnasal drip and sinus pressure.   Eyes: Negative for photophobia, discharge, redness, itching and visual disturbance.  Respiratory: Negative for cough, shortness of breath and wheezing.   Cardiovascular: Negative for chest pain, palpitations and leg swelling.  Gastrointestinal: Negative for abdominal pain, constipation, diarrhea, nausea and vomiting.  Genitourinary: Negative for dysuria and flank pain.  Musculoskeletal: Negative for arthralgias, back pain, gait problem and neck pain.  Skin: Negative for color change.  Allergic/Immunologic: Negative for environmental allergies and food allergies.  Neurological: Negative for dizziness and headaches.  Hematological: Does not bruise/bleed easily.  Psychiatric/Behavioral: Negative for agitation, behavioral problems (depression) and hallucinations.    Vital Signs: BP (!) 150/98   Pulse 74   Temp 97.8 F (36.6 C)   Resp 16   Ht 5\' 4"  (1.626 m)   Wt 118 lb (53.5 kg)   LMP  (LMP Unknown)   SpO2 97%   BMI 20.25 kg/m    Physical Exam Constitutional:      General: She is not in acute distress.    Appearance: She is well-developed. She is not diaphoretic.  HENT:     Head: Normocephalic and atraumatic.     Mouth/Throat:     Pharynx: No oropharyngeal  exudate.  Eyes:     Pupils: Pupils are equal, round, and reactive to light.  Neck:     Thyroid: No thyromegaly.     Vascular: No JVD.     Trachea: No tracheal deviation.  Cardiovascular:     Rate and Rhythm: Normal rate and regular rhythm.     Heart sounds: Normal heart sounds. No murmur heard.  No friction rub. No gallop.   Pulmonary:     Effort: Pulmonary effort is normal. No respiratory distress.     Breath sounds: No wheezing or rales.  Chest:     Chest wall: No tenderness.  Abdominal:     General: Bowel sounds are normal.     Palpations: Abdomen is soft.  Musculoskeletal:        General: Normal range of motion.     Cervical back: Normal range of motion and neck supple.  Lymphadenopathy:     Cervical: No cervical adenopathy.  Skin:    General: Skin is warm and dry.  Neurological:     Mental Status: She is alert and oriented to person, place, and time.     Cranial Nerves: No cranial nerve deficit.  Psychiatric:        Behavior: Behavior normal.        Thought Content: Thought content normal.        Judgment: Judgment normal.     Assessment/Plan: 1. Pulmonary nodule - CT CHEST LUNG CA SCREEN LOW DOSE W/O CM; Future - pt needs follow up 2. Tobacco use disorder, continuous - Counseling provided  - CT CHEST LUNG CA SCREEN LOW DOSE W/O CM; Future  3. Tobacco abuse counseling -Smoking cessation counseling: 1. Pt acknowledges the risks of long term smoking, she will try to quite smoking. 2. Options for different medications including nicotine products, chewing gum, patch etc, Wellbutrin and Chantix is discussed 3. Goal and date of compete cessation is discussed 4. Total time spent in smoking cessation is 10 min.   4. Alcohol dependence with alcohol-induced anxiety disorder ([place]) - pt does not want any medicine for treatment   5. Mixed hyperlipidemia - Evolocumab (REPATHA) 140 MG/ML SOSY; Use as directed by cardiology  Dispense: 2.1 mL; Refill: 0 - atorvastatin  (LIPITOR) 80 MG tablet;  Take 1 tablet (80 mg total) by mouth daily.  Dispense: 90 tablet; Refill: 3  General Counseling: [name] verbalizes understanding of the findings of todays visit and agrees with plan of treatment. I have discussed any further diagnostic evaluation that may be needed or ordered today. We also reviewed her medications today. she has been encouraged to call the office with any questions or concerns that should arise related to todays visit.    Orders Placed This Encounter  Procedures  . CT Head Wo Contrast  . CT CHEST LUNG CA SCREEN LOW DOSE W/O CM    Meds ordered this encounter  Medications  . Evolocumab (REPATHA) 140 MG/ML SOSY    Sig: Use as directed by cardiology    Dispense:  2.1 mL    Refill:  0  . atorvastatin (LIPITOR) 80 MG tablet    Sig: Take 1 tablet (80 mg total) by mouth daily.    Dispense:  90 tablet    Refill:  3    Total time spent:35 Minutes Time spent includes review of chart, medications, test results, and follow up plan with the patient.    Dr Lavera Guise Internal medicine

## 2020-04-18 NOTE — Progress Notes (Deleted)
[facility] [address], [place] [address]  Internal MEDICINE  Office Visit Note  Patient Name: [name]  [id number]  [id number]  Date of Service: 04/18/2020  Chief Complaint  Patient presents with  . Follow-up  . Hyperlipidemia  . Hypertension  . Quality Metric Gaps    TDAP   HPI Pt is here for routine follow up. She was started on Lexapro for anxiety and alcohol dependence, she said she did not take medicine because she is not drinking that much and has better control over things. She will need follow up on pulmonary nodules before seen by Pulmonary. Continues to see cardio and Vascular. Unfortunately she continues to smoke, BP is elevated today as well    Current Medication: Outpatient Encounter Medications as of 04/06/2020  Medication Sig  . albuterol (VENTOLIN HFA) 108 (90 Base) MCG/ACT inhaler Inhale 2 puffs into the lungs every 6 (six) hours as needed for wheezing or shortness of breath.  Marland Kitchen aspirin EC 81 MG EC tablet Take 1 tablet (81 mg total) by mouth daily.  . budesonide-formoterol (SYMBICORT) 80-4.5 MCG/ACT inhaler Inhale 2 puffs into the lungs daily.  . cetirizine (ZYRTEC) 10 MG tablet Take 10 mg by mouth daily.  . cholecalciferol (VITAMIN D) 400 units TABS tablet Take 400 Units by mouth.  . ferrous sulfate 325 (65 FE) MG tablet Take 325 mg by mouth daily.  Marland Kitchen losartan (COZAAR) 50 MG tablet Take 1 tablet (50 mg total) by mouth daily.  . [DISCONTINUED] atorvastatin (LIPITOR) 80 MG tablet TAKE 1 TABLET BY MOUTH IN THE MORNING . APPOINTMENT REQUIRED FOR FUTURE REFILLS (Patient taking differently: Take 80 mg by mouth daily. )  . [DISCONTINUED] escitalopram (LEXAPRO) 10 MG tablet Take 1 tablet (10 mg total) by mouth daily. For anxiety and depression  . atorvastatin (LIPITOR) 80 MG tablet Take 1 tablet (80 mg total) by mouth daily.  . Evolocumab (REPATHA) 140 MG/ML SOSY Use as directed by cardiology   No facility-administered encounter medications on  file as of 04/06/2020.    Surgical History: Past Surgical History:  Procedure Laterality Date  . ABDOMINAL HYSTERECTOMY    . APPENDECTOMY    . CARDIAC CATHETERIZATION    . CARDIAC SURGERY    . CHOLECYSTECTOMY    . COLONOSCOPY  12/15/11   OH->bleeding internal hemorrhoids, otherwise normal  . COLONOSCOPY WITH PROPOFOL N/A 04/12/2015   Procedure: COLONOSCOPY WITH PROPOFOL;  Surgeon: Lucilla Lame, MD;  Location: [facility] ENDOSCOPY;  Service: Endoscopy;  Laterality: N/A;  . COLONOSCOPY WITH PROPOFOL N/A 01/05/2020   Procedure: COLONOSCOPY WITH PROPOFOL;  Surgeon: Lucilla Lame, MD;  Location: [facility] ENDOSCOPY;  Service: Endoscopy;  Laterality: N/A;  . ENDARTERECTOMY FEMORAL Left 04/26/2015   Procedure: ENDARTERECTOMY FEMORAL;  Surgeon: Algernon Huxley, MD;  Location: [facility] ORS;  Service: Vascular;  Laterality: Left;  . ENDOVASCULAR STENT INSERTION Bilateral    Legs  . FLEXIBLE BRONCHOSCOPY N/A 02/02/2015   Procedure: FLEXIBLE BRONCHOSCOPY;  Surgeon: Allyne Gee, MD;  Location: [facility] ORS;  Service: Pulmonary;  Laterality: N/A;  . HEMORRHOID SURGERY    . PERIPHERAL VASCULAR CATHETERIZATION Left 04/25/2015   Procedure: Lower Extremity Angiography;  Surgeon: Algernon Huxley, MD;  Location: [place] [facility];  Service: Cardiovascular;  Laterality: Left;  . PERIPHERAL VASCULAR CATHETERIZATION Left 04/25/2015   Procedure: Lower Extremity Intervention;  Surgeon: Algernon Huxley, MD;  Location: [place] [facility];  Service: Cardiovascular;  Laterality: Left;  . PERIPHERAL VASCULAR CATHETERIZATION Left 01/02/2016   Procedure: Lower  Extremity Angiography;  Surgeon: Algernon Huxley, MD;  Location: [place] [facility];  Service: Cardiovascular;  Laterality: Left;  . PERIPHERAL VASCULAR CATHETERIZATION  01/02/2016   Procedure: Lower Extremity Intervention;  Surgeon: Algernon Huxley, MD;  Location: [place] [facility];  Service: Cardiovascular;;  . THROMBECTOMY FEMORAL ARTERY Left 04/26/2015   Procedure: THROMBECTOMY FEMORAL ARTERY;   Surgeon: Algernon Huxley, MD;  Location: [facility] ORS;  Service: Vascular;  Laterality: Left;    Medical History: Past Medical History:  Diagnosis Date  . Allergy    Environmental  . Arthritis   . Asthma   . Carotid artery stenosis   . Carotid stenosis   . COPD (chronic obstructive pulmonary disease) ([place])   . Coronary artery disease   . Coronary artery disease   . Hemorrhoids   . Hyperlipidemia   . Hypertension   . 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[facility])    negative cardiac cath  . Nicotine addiction   . Peripheral vascular disease ([place])   . Pneumonia   . Shortness of breath dyspnea   . Tobacco abuse     Family History: Family History  Problem Relation Age of Onset  . Hypertension Mother   . Hypertension Father     Social History   Socioeconomic History  . Marital status: Single    Spouse name: Not on file  . Number of children: Not on file  . Years of education: Not on file  . Highest education level: Not on file  Occupational History  . Not on file  Tobacco Use  . Smoking status: Current Every Day Smoker    Packs/day: 1.00    Years: 41.00    Pack years: 41.00    Types: Cigarettes  . Smokeless tobacco: Never Used  . Tobacco comment: pt recommend to stop smoking 4 min spent will start on prn nicotine replacment  Vaping Use  . Vaping Use: Never used  Substance and Sexual Activity  . Alcohol use: Yes    Alcohol/week: 0.0 standard drinks    Comment: weekends, couple beers, pint of liquer only on weekends.  Last drank approx 1 month ago.  . Drug use: No  . Sexual activity: Not Currently  Other Topics Concern  . Not on file  Social History Narrative   Lives with family    Social Determinants of Health   Financial Resource Strain:   . Difficulty of Paying Living Expenses:   Food Insecurity:   . Worried About Charity fundraiser in the Last Year:   . Arboriculturist in the Last Year:   Transportation Needs:   . Film/video editor (Medical):   Marland Kitchen Lack of Transportation  (Non-Medical):   Physical Activity:   . Days of Exercise per Week:   . Minutes of Exercise per Session:   Stress:   . Feeling of Stress :   Social Connections:   . Frequency of Communication with Friends and Family:   . Frequency of Social Gatherings with Friends and Family:   . Attends Religious Services:   . Active Member of Clubs or Organizations:   . Attends Archivist Meetings:   Marland Kitchen Marital Status:   Intimate Partner Violence:   . Fear of Current or Ex-Partner:   . Emotionally Abused:   Marland Kitchen Physically Abused:   . Sexually Abused:       R

## 2020-04-19 ENCOUNTER — Ambulatory Visit: Payer: Medicare Other

## 2020-04-29 ENCOUNTER — Other Ambulatory Visit: Payer: Self-pay

## 2020-04-29 ENCOUNTER — Ambulatory Visit
Admission: RE | Admit: 2020-04-29 | Discharge: 2020-04-29 | Disposition: A | Discharge status: Home / Self Care | Payer: Medicare Other | Source: Ambulatory Visit | Attending: Internal Medicine | Admitting: Internal Medicine

## 2020-04-29 DIAGNOSIS — R918 Other nonspecific abnormal finding of lung field: Secondary | ICD-10-CM | POA: Diagnosis not present

## 2020-04-29 DIAGNOSIS — R911 Solitary pulmonary nodule: Secondary | ICD-10-CM | POA: Diagnosis not present

## 2020-04-29 DIAGNOSIS — F17209 Nicotine dependence, unspecified, with unspecified nicotine-induced disorders: Secondary | ICD-10-CM | POA: Insufficient documentation

## 2020-04-29 DIAGNOSIS — I700 Atherosclerosis of aorta: Secondary | ICD-10-CM | POA: Diagnosis not present

## 2020-04-29 DIAGNOSIS — I2510 Atherosclerotic heart disease of native coronary artery without angina pectoris: Secondary | ICD-10-CM | POA: Diagnosis not present

## 2020-04-29 DIAGNOSIS — J439 Emphysema, unspecified: Secondary | ICD-10-CM | POA: Diagnosis not present

## 2020-05-11 ENCOUNTER — Other Ambulatory Visit: Payer: Self-pay

## 2020-05-11 DIAGNOSIS — E782 Mixed hyperlipidemia: Secondary | ICD-10-CM

## 2020-05-11 MED ORDER — ATORVASTATIN CALCIUM 80 MG PO TABS: 80.0000 mg | ORAL_TABLET | Freq: Every day | ORAL | 3 refills | Status: DC

## 2020-05-18 ENCOUNTER — Other Ambulatory Visit: Payer: Self-pay

## 2020-05-18 DIAGNOSIS — E782 Mixed hyperlipidemia: Secondary | ICD-10-CM

## 2020-05-18 MED ORDER — ATORVASTATIN CALCIUM 80 MG PO TABS: 80.0000 mg | ORAL_TABLET | Freq: Every day | ORAL | 1 refills | Status: DC

## 2020-06-14 ENCOUNTER — Ambulatory Visit (INDEPENDENT_AMBULATORY_CARE_PROVIDER_SITE_OTHER): Payer: Medicare Other | Admitting: Nurse Practitioner

## 2020-06-14 ENCOUNTER — Ambulatory Visit (INDEPENDENT_AMBULATORY_CARE_PROVIDER_SITE_OTHER): Payer: Medicare Other

## 2020-06-14 ENCOUNTER — Other Ambulatory Visit: Payer: Self-pay

## 2020-06-14 VITALS — BP 176/93 | HR 71 | Ht 64.0 in | Wt 118.0 lb

## 2020-06-14 DIAGNOSIS — E782 Mixed hyperlipidemia: Secondary | ICD-10-CM | POA: Diagnosis not present

## 2020-06-14 DIAGNOSIS — I10 Essential (primary) hypertension: Secondary | ICD-10-CM | POA: Diagnosis not present

## 2020-06-14 DIAGNOSIS — I6523 Occlusion and stenosis of bilateral carotid arteries: Secondary | ICD-10-CM

## 2020-06-14 DIAGNOSIS — I70213 Atherosclerosis of native arteries of extremities with intermittent claudication, bilateral legs: Secondary | ICD-10-CM

## 2020-06-14 DIAGNOSIS — F17200 Nicotine dependence, unspecified, uncomplicated: Secondary | ICD-10-CM | POA: Diagnosis not present

## 2020-06-19 ENCOUNTER — Encounter (INDEPENDENT_AMBULATORY_CARE_PROVIDER_SITE_OTHER): Payer: Self-pay | Admitting: Nurse Practitioner

## 2020-06-19 NOTE — Progress Notes (Signed)
Subjective:    Patient ID: [name], female    DOB: 06/07/1956, 64 y.o.   MRN: [id number] Chief Complaint  Patient presents with  . Follow-up    U/S follow up    The patient returns to the office for followup and review of the noninvasive studies. There have been no interval changes in lower extremity symptoms. No interval shortening of the patient's claudication distance or development of rest pain symptoms. No new ulcers or wounds have occurred since the last visit.  There have been no significant changes to the patient's overall health care.  The patient denies amaurosis fugax or recent TIA symptoms. There are no recent neurological changes noted. The patient denies history of DVT, PE or superficial thrombophlebitis. The patient denies recent episodes of angina or shortness of breath.   ABI Rt=1.05 and Lt=0.98  (previous ABI's Rt=1.09 and Lt=1.05) Duplex ultrasound of the right tibial arteries reveals triphasic waveforms in the anterior tibial artery with absent waveforms in the posterior tibial artery.  The left tibial arteries have biphasic/monophasic waveforms.  Patient does have good toe waveforms bilaterally.  The patient is seen for follow up evaluation of carotid stenosis. The carotid stenosis followed by ultrasound.    The patient is taking enteric-coated aspirin 81 mg daily.  Carotid Duplex done today shows 1 to 39% stenosis in the bilateral internal carotid arteries.  There is slight change compared to the previous study last year.  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   Review of Systems  Respiratory: Negative for shortness of breath.   Skin: Negative for color change and pallor.  Neurological: Negative for dizziness, facial asymmetry, numbness and headaches.  All other  systems reviewed and are negative.      Objective:   Physical Exam Vitals reviewed.  HENT:     Head: Normocephalic.  Cardiovascular:     Rate and Rhythm: Normal rate and regular rhythm.     Pulses: Normal pulses.  Pulmonary:     Effort: Pulmonary effort is normal.  Musculoskeletal:        General: Normal range of motion.  Skin:    General: Skin is warm and dry.  Neurological:     Mental Status: She is alert and oriented to person, place, and time.  Psychiatric:        Mood and Affect: Mood normal.        Behavior: Behavior normal.        Thought Content: Thought content normal.        Judgment: Judgment normal.     BP (!) 176/93   Pulse 71   Ht 5\' 4"  (1.626 m)   Wt 118 lb (53.5 kg)   LMP  (LMP Unknown)   BMI 20.25 kg/m   Past Medical History:  Diagnosis Date  . Allergy    Environmental  . Arthritis   . Asthma   . Carotid artery stenosis   . Carotid stenosis   . COPD (chronic obstructive pulmonary disease) ([place])   . Coronary artery disease   . Coronary artery disease   . Hemorrhoids   . Hyperlipidemia   . Hypertension   . 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[facility])    negative cardiac cath  . Nicotine addiction   . Peripheral vascular disease ([place])   . Pneumonia   . Shortness  of breath dyspnea   . Tobacco abuse     Social History   Socioeconomic History  . Marital status: Single    Spouse name: Not on file  . Number of children: Not on file  . Years of education: Not on file  . Highest education level: Not on file  Occupational History  . Not on file  Tobacco Use  . Smoking status: Current Every Day Smoker    Packs/day: 1.00    Years: 41.00    Pack years: 41.00    Types: Cigarettes  . Smokeless tobacco: Never Used  . Tobacco comment: pt recommend to stop smoking 4 min spent will start on prn nicotine replacment  Vaping Use  . Vaping Use: Never used  Substance and Sexual Activity  . Alcohol use: Yes    Alcohol/week: 0.0 standard drinks    Comment:  weekends, couple beers, pint of liquer only on weekends.  Last drank approx 1 month ago.  . Drug use: No  . Sexual activity: Not Currently  Other Topics Concern  . Not on file  Social History Narrative   Lives with family    Social Determinants of Health   Financial Resource Strain:   . Difficulty of Paying Living Expenses: Not on file  Food Insecurity:   . Worried About Charity fundraiser in the Last Year: Not on file  . Ran Out of Food in the Last Year: Not on file  Transportation Needs:   . Lack of Transportation (Medical): Not on file  . Lack of Transportation (Non-Medical): Not on file  Physical Activity:   . Days of Exercise per Week: Not on file  . Minutes of Exercise per Session: Not on file  Stress:   . Feeling of Stress : Not on file  Social Connections:   . Frequency of Communication with Friends and Family: Not on file  . Frequency of Social Gatherings with Friends and Family: Not on file  . Attends Religious Services: Not on file  . Active Member of Clubs or Organizations: Not on file  . Attends Archivist Meetings: Not on file  . Marital Status: Not on file  Intimate Partner Violence:   . Fear of Current or Ex-Partner: Not on file  . Emotionally Abused: Not on file  . Physically Abused: Not on file  . Sexually Abused: Not on file    Past Surgical History:  Procedure Laterality Date  . ABDOMINAL HYSTERECTOMY    . APPENDECTOMY    . CARDIAC CATHETERIZATION    . CARDIAC SURGERY    . CHOLECYSTECTOMY    . COLONOSCOPY  12/15/11   [place]->bleeding internal hemorrhoids, otherwise normal  . COLONOSCOPY WITH PROPOFOL N/A 04/12/2015   Procedure: COLONOSCOPY WITH PROPOFOL;  Surgeon: Lucilla Lame, MD;  Location: [facility] ENDOSCOPY;  Service: Endoscopy;  Laterality: N/A;  . COLONOSCOPY WITH PROPOFOL N/A 01/05/2020   Procedure: COLONOSCOPY WITH PROPOFOL;  Surgeon: Lucilla Lame, MD;  Location: [facility] ENDOSCOPY;  Service: Endoscopy;  Laterality: N/A;  . ENDARTERECTOMY FEMORAL  Left 04/26/2015   Procedure: ENDARTERECTOMY FEMORAL;  Surgeon: Algernon Huxley, MD;  Location: [facility] ORS;  Service: Vascular;  Laterality: Left;  . ENDOVASCULAR STENT INSERTION Bilateral    Legs  . FLEXIBLE BRONCHOSCOPY N/A 02/02/2015   Procedure: FLEXIBLE BRONCHOSCOPY;  Surgeon: Allyne Gee, MD;  Location: [facility] ORS;  Service: Pulmonary;  Laterality: N/A;  . HEMORRHOID SURGERY    . PERIPHERAL VASCULAR CATHETERIZATION Left 04/25/2015   Procedure: Lower Extremity Angiography;  Surgeon: Algernon Huxley, MD;  Location: [place] CV LAB;  Service: Cardiovascular;  Laterality: Left;  . PERIPHERAL VASCULAR CATHETERIZATION Left 04/25/2015   Procedure: Lower Extremity Intervention;  Surgeon: Algernon Huxley, MD;  Location: [place] [facility];  Service: Cardiovascular;  Laterality: Left;  . PERIPHERAL VASCULAR CATHETERIZATION Left 01/02/2016   Procedure: Lower Extremity Angiography;  Surgeon: Algernon Huxley, MD;  Location: [place] [facility];  Service: Cardiovascular;  Laterality: Left;  . PERIPHERAL VASCULAR CATHETERIZATION  01/02/2016   Procedure: Lower Extremity Intervention;  Surgeon: Algernon Huxley, MD;  Location: [place] CV LAB;  Service: Cardiovascular;;  . THROMBECTOMY FEMORAL ARTERY Left 04/26/2015   Procedure: THROMBECTOMY FEMORAL ARTERY;  Surgeon: Algernon Huxley, MD;  Location: [facility] ORS;  Service: Vascular;  Laterality: Left;    Family History  Problem Relation Age of Onset  . Hypertension Mother   . Hypertension Father     Allergies  Allergen Reactions  . Aspirin Nausea And Vomiting and Other (See Comments)    Pt states aspirin makes her cramp and have to use coated kind.  . Tramadol   . Zyrtec [Cetirizine]        Assessment & Plan:   1. Bilateral carotid artery stenosis Recommend:  Given the patient's asymptomatic subcritical stenosis no further invasive testing or surgery at this time.  Duplex ultrasound shows 1-39% stenosis bilaterally.  Continue antiplatelet therapy as  prescribed Continue management of CAD, HTN and Hyperlipidemia Healthy heart diet,  encouraged exercise at least 4 times per week Follow up in 12 months with duplex ultrasound and physical exam   2. Atherosclerosis of native artery of both lower extremities with intermittent claudication (HCC)  Recommend:  The patient has evidence of atherosclerosis of the lower extremities with claudication.  The patient does not voice lifestyle limiting changes at this point in time.  Noninvasive studies do not suggest clinically significant change.  No invasive studies, angiography or surgery at this time The patient should continue walking and begin a more formal exercise program.  The patient should continue antiplatelet therapy and aggressive treatment of the lipid abnormalities  No changes in the patient's medications at this time  The patient should continue wearing graduated compression socks 10-15 mmHg strength to control the mild edema.   Follow up one year 3. Essential hypertension Continue antihypertensive medications as already ordered, these medications have been reviewed and there are no changes at this time.   4. Tobacco use disorder Smoking cessation was discussed, 3-10 minutes spent on this topic specifically   5. Mixed hyperlipidemia Continue statin as ordered and reviewed, no changes at this time    Current Outpatient Medications on File Prior to Visit  Medication Sig Dispense Refill  . albuterol (VENTOLIN HFA) 108 (90 Base) MCG/ACT inhaler Inhale 2 puffs into the lungs every 6 (six) hours as needed for wheezing or shortness of breath. 18 g 3  . aspirin EC 81 MG EC tablet Take 1 tablet (81 mg total) by mouth daily. 90 tablet 1  . atorvastatin (LIPITOR) 80 MG tablet Take 1 tablet (80 mg total) by mouth daily. 90 tablet 1  . budesonide-formoterol (SYMBICORT) 80-4.5 MCG/ACT inhaler Inhale 2 puffs into the lungs daily. 1 Inhaler 5  . cetirizine (ZYRTEC) 10 MG tablet Take 10 mg  by mouth daily.    . cholecalciferol (VITAMIN D) 400 units TABS tablet Take 400 Units by mouth.    . Evolocumab (REPATHA) 140 MG/ML SOSY Use as directed by cardiology 2.1  mL 0  . ezetimibe (ZETIA) 10 MG tablet Take 10 mg by mouth daily.    . ferrous sulfate 325 (65 FE) MG tablet Take 325 mg by mouth daily.    . isosorbide mononitrate (IMDUR) 30 MG 24 hr tablet Take 30 mg by mouth daily.    Marland Kitchen losartan (COZAAR) 50 MG tablet Take 1 tablet (50 mg total) by mouth daily. 90 tablet 1   No current facility-administered medications on file prior to visit.    There are no Patient Instructions on file for this visit. No follow-ups on file.   Kris Hartmann, NP

## 2020-07-18 ENCOUNTER — Ambulatory Visit (INDEPENDENT_AMBULATORY_CARE_PROVIDER_SITE_OTHER): Payer: Medicare Other | Admitting: Internal Medicine

## 2020-07-18 ENCOUNTER — Encounter: Payer: Self-pay | Admitting: Internal Medicine

## 2020-07-18 ENCOUNTER — Other Ambulatory Visit: Payer: Self-pay

## 2020-07-18 VITALS — BP 150/80 | HR 76 | Temp 97.8°F | Resp 16 | Ht 64.0 in | Wt 122.0 lb

## 2020-07-18 DIAGNOSIS — R911 Solitary pulmonary nodule: Secondary | ICD-10-CM

## 2020-07-18 DIAGNOSIS — F17209 Nicotine dependence, unspecified, with unspecified nicotine-induced disorders: Secondary | ICD-10-CM

## 2020-07-18 DIAGNOSIS — J449 Chronic obstructive pulmonary disease, unspecified: Secondary | ICD-10-CM | POA: Diagnosis not present

## 2020-07-18 DIAGNOSIS — R0602 Shortness of breath: Secondary | ICD-10-CM

## 2020-07-18 DIAGNOSIS — I2510 Atherosclerotic heart disease of native coronary artery without angina pectoris: Secondary | ICD-10-CM | POA: Diagnosis not present

## 2020-07-18 NOTE — Patient Instructions (Signed)
Coping with Quitting Smoking  Quitting smoking is a physical and mental challenge. You will face cravings, withdrawal symptoms, and temptation. Before quitting, work with your health care provider to make a plan that can help you cope. Preparation can help you quit and keep you from giving in. How can I cope with cravings? Cravings usually last for 5-10 minutes. If you get through it, the craving will pass. Consider taking the following actions to help you cope with cravings:  Keep your mouth busy: ? Chew sugar-free gum. ? Suck on hard candies or a straw. ? Brush your teeth.  Keep your hands and body busy: ? Immediately change to a different activity when you feel a craving. ? Squeeze or play with a ball. ? Do an activity or a hobby, like making bead jewelry, practicing needlepoint, or working with wood. ? Mix up your normal routine. ? Take a short exercise break. Go for a quick walk or run up and down stairs. ? Spend time in public places where smoking is not allowed.  Focus on doing something kind or helpful for someone else.  Call a friend or family member to talk during a craving.  Join a support group.  Call a quit line, such as 1-800-QUIT-NOW.  Talk with your health care provider about medicines that might help you cope with cravings and make quitting easier for you. How can I deal with withdrawal symptoms? Your body may experience negative effects as it tries to get used to not having nicotine in the system. These effects are called withdrawal symptoms. They may include:  Feeling hungrier than normal.  Trouble concentrating.  Irritability.  Trouble sleeping.  Feeling depressed.  Restlessness and agitation.  Craving a cigarette. To manage withdrawal symptoms:  Avoid places, people, and activities that trigger your cravings.  Remember why you want to quit.  Get plenty of sleep.  Avoid coffee and other caffeinated drinks. These may worsen some of your  symptoms. How can I handle social situations? Social situations can be difficult when you are quitting smoking, especially in the first few weeks. To manage this, you can:  Avoid parties, bars, and other social situations where people might be smoking.  Avoid alcohol.  Leave right away if you have the urge to smoke.  Explain to your family and friends that you are quitting smoking. Ask for understanding and support.  Plan activities with friends or family where smoking is not an option. What are some ways I can cope with stress? Wanting to smoke may cause stress, and stress can make you want to smoke. Find ways to manage your stress. Relaxation techniques can help. For example:  Breathe slowly and deeply, in through your nose and out through your mouth.  Listen to soothing, relaxing music.  Talk with a family member or friend about your stress.  Light a candle.  Soak in a bath or take a shower.  Think about a peaceful place. What are some ways I can prevent weight gain? Be aware that many people gain weight after they quit smoking. However, not everyone does. To keep from gaining weight, have a plan in place before you quit and stick to the plan after you quit. Your plan should include:  Having healthy snacks. When you have a craving, it may help to: ? Eat plain popcorn, crunchy carrots, celery, or other cut vegetables. ? Chew sugar-free gum.  Changing how you eat: ? Eat small portion sizes at meals. ? Eat 4-6 small meals   throughout the day instead of 1-2 large meals a day. ? Be mindful when you eat. Do not watch television or do other things that might distract you as you eat.  Exercising regularly: ? Make time to exercise each day. If you do not have time for a long workout, do short bouts of exercise for 5-10 minutes several times a day. ? Do some form of strengthening exercise, like weight lifting, and some form of aerobic exercise, like running or swimming.  Drinking  plenty of water or other low-calorie or no-calorie drinks. Drink 6-8 glasses of water daily, or as much as instructed by your health care provider. Summary  Quitting smoking is a physical and mental challenge. You will face cravings, withdrawal symptoms, and temptation to smoke again. Preparation can help you as you go through these challenges.  You can cope with cravings by keeping your mouth busy (such as by chewing gum), keeping your body and hands busy, and making calls to family, friends, or a helpline for people who want to quit smoking.  You can cope with withdrawal symptoms by avoiding places where people smoke, avoiding drinks with caffeine, and getting plenty of rest.  Ask your health care provider about the different ways to prevent weight gain, avoid stress, and handle social situations. This information is not intended to replace advice given to you by your health care provider. Make sure you discuss any questions you have with your health care provider. Document Revised: 08/02/2017 Document Reviewed: 08/17/2016 [employer] Patient Education  2020 [employer]  
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
## 2020-07-19 ENCOUNTER — Ambulatory Visit: Payer: Medicare Other | Admitting: Internal Medicine

## 2020-07-20 DIAGNOSIS — H2513 Age-related nuclear cataract, bilateral: Secondary | ICD-10-CM | POA: Diagnosis not present

## 2020-07-20 DIAGNOSIS — H5213 Myopia, bilateral: Secondary | ICD-10-CM | POA: Diagnosis not present

## 2020-07-22 DIAGNOSIS — I2510 Atherosclerotic heart disease of native coronary artery without angina pectoris: Secondary | ICD-10-CM | POA: Diagnosis not present

## 2020-07-22 DIAGNOSIS — E785 Hyperlipidemia, unspecified: Secondary | ICD-10-CM | POA: Diagnosis not present

## 2020-07-22 DIAGNOSIS — I10 Essential (primary) hypertension: Secondary | ICD-10-CM | POA: Diagnosis not present

## 2020-07-22 DIAGNOSIS — F17210 Nicotine dependence, cigarettes, uncomplicated: Secondary | ICD-10-CM | POA: Diagnosis not present

## 2020-07-22 DIAGNOSIS — I4891 Unspecified atrial fibrillation: Secondary | ICD-10-CM | POA: Diagnosis not present

## 2020-07-22 DIAGNOSIS — I340 Nonrheumatic mitral (valve) insufficiency: Secondary | ICD-10-CM | POA: Diagnosis not present

## 2020-07-27 ENCOUNTER — Ambulatory Visit: Payer: Medicare Other | Admitting: Adult Health

## 2020-08-08 ENCOUNTER — Encounter: Payer: Self-pay | Admitting: Nurse Practitioner

## 2020-08-08 ENCOUNTER — Ambulatory Visit (INDEPENDENT_AMBULATORY_CARE_PROVIDER_SITE_OTHER): Payer: Medicare Other | Admitting: Nurse Practitioner

## 2020-08-08 ENCOUNTER — Other Ambulatory Visit: Payer: Self-pay

## 2020-08-08 VITALS — BP 92/70 | HR 93 | Temp 97.5°F | Resp 16 | Ht 64.0 in | Wt 121.8 lb

## 2020-08-08 DIAGNOSIS — I6523 Occlusion and stenosis of bilateral carotid arteries: Secondary | ICD-10-CM

## 2020-08-08 DIAGNOSIS — I10 Essential (primary) hypertension: Secondary | ICD-10-CM | POA: Diagnosis not present

## 2020-08-08 DIAGNOSIS — I2510 Atherosclerotic heart disease of native coronary artery without angina pectoris: Secondary | ICD-10-CM

## 2020-08-08 DIAGNOSIS — I2583 Coronary atherosclerosis due to lipid rich plaque: Secondary | ICD-10-CM | POA: Diagnosis not present

## 2020-08-08 DIAGNOSIS — J449 Chronic obstructive pulmonary disease, unspecified: Secondary | ICD-10-CM

## 2020-08-08 NOTE — Progress Notes (Signed)
[facility] [address] [place], [place] [address]  Internal MEDICINE  Office Visit Note  Patient Name: [name]  [id number]  [id number]  Date of Service: 09/07/2020  Chief Complaint  Patient presents with  . Follow-up  . Hyperlipidemia  . Hypertension  . Quality Metric Gaps    Hep C  . controlled substance form    reviewed with PT    The patient is here for routine follow up visit.  -some fatigue -blood pressure low/normal today. Followed by cardiology routinely.  -followed by vein and vascular due to bilateral carotid stenosis.  -treated per pulmonology for COPD.  -states that she feels well. Denies concerns or complaints today.       Current Medication: Outpatient Encounter Medications as of 08/08/2020  Medication Sig  . albuterol (VENTOLIN HFA) 108 (90 Base) MCG/ACT inhaler Inhale 2 puffs into the lungs every 6 (six) hours as needed for wheezing or shortness of breath.  Marland Kitchen aspirin EC 81 MG EC tablet Take 1 tablet (81 mg total) by mouth daily.  Marland Kitchen atorvastatin (LIPITOR) 80 MG tablet Take 1 tablet (80 mg total) by mouth daily.  . budesonide-formoterol (SYMBICORT) 80-4.5 MCG/ACT inhaler Inhale 2 puffs into the lungs daily.  . cetirizine (ZYRTEC) 10 MG tablet Take 10 mg by mouth daily.  . cholecalciferol (VITAMIN D) 400 units TABS tablet Take 400 Units by mouth.  . Evolocumab (REPATHA) 140 MG/ML SOSY Use as directed by cardiology  . ferrous sulfate 325 (65 FE) MG tablet Take 325 mg by mouth daily.  . isosorbide mononitrate (IMDUR) 30 MG 24 hr tablet Take 30 mg by mouth daily.  Marland Kitchen losartan (COZAAR) 50 MG tablet Take 1 tablet (50 mg total) by mouth daily.  . [DISCONTINUED] ezetimibe (ZETIA) 10 MG tablet Take 10 mg by mouth daily.   No facility-administered encounter medications on file as of 08/08/2020.    Surgical History: Past Surgical History:  Procedure Laterality Date  . ABDOMINAL HYSTERECTOMY    . APPENDECTOMY    . CARDIAC CATHETERIZATION    .  CARDIAC SURGERY    . CHOLECYSTECTOMY    . COLONOSCOPY  12/15/11   OH->bleeding internal hemorrhoids, otherwise normal  . COLONOSCOPY WITH PROPOFOL N/A 04/12/2015   Procedure: COLONOSCOPY WITH PROPOFOL;  Surgeon: Lucilla Lame, MD;  Location: [facility] ENDOSCOPY;  Service: Endoscopy;  Laterality: N/A;  . COLONOSCOPY WITH PROPOFOL N/A 01/05/2020   Procedure: COLONOSCOPY WITH PROPOFOL;  Surgeon: Lucilla Lame, MD;  Location[facility] ENDOSCOPY;  Service: Endoscopy;  Laterality: N/A;  . ENDARTERECTOMY FEMORAL Left 04/26/2015   Procedure: ENDARTERECTOMY FEMORAL;  Surgeon: Algernon Huxley, MD;  Location: [facility] ORS;  Service: Vascular;  Laterality: Left;  . ENDOVASCULAR STENT INSERTION Bilateral    Legs  . FLEXIBLE BRONCHOSCOPY N/A 02/02/2015   Procedure: FLEXIBLE BRONCHOSCOPY;  Surgeon: Allyne Gee, MD;  Location: [facility] ORS;  Service: Pulmonary;  Laterality: N/A;  . HEMORRHOID SURGERY    . PERIPHERAL VASCULAR CATHETERIZATION Left 04/25/2015   Procedure: Lower Extremity Angiography;  Surgeon: Algernon Huxley, MD;  Location: [place] CV LAB;  Service: Cardiovascular;  Laterality: Left;  . PERIPHERAL VASCULAR CATHETERIZATION Left 04/25/2015   Procedure: Lower Extremity Intervention;  Surgeon: Algernon Huxley, MD;  Location: [place] CV LAB;  Service: Cardiovascular;  Laterality: Left;  . PERIPHERAL VASCULAR CATHETERIZATION Left 01/02/2016   Procedure: Lower Extremity Angiography;  Surgeon: Algernon Huxley, MD;  Location: [place] CV LAB;  Service: Cardiovascular;  Laterality: Left;  . PERIPHERAL VASCULAR CATHETERIZATION  01/02/2016   Procedure: Lower Extremity Intervention;  Surgeon: Algernon Huxley, MD;  Location: [place] CV LAB;  Service: Cardiovascular;;  . THROMBECTOMY FEMORAL ARTERY Left 04/26/2015   Procedure: THROMBECTOMY FEMORAL ARTERY;  Surgeon: Algernon Huxley, MD;  Location: [facility] ORS;  Service: Vascular;  Laterality: Left;    Medical History: Past Medical History:  Diagnosis Date  . Allergy    Environmental  .  Arthritis   . Asthma   . Carotid artery stenosis   . Carotid stenosis   . COPD (chronic obstructive pulmonary disease) ([place])   . Coronary artery disease   . Coronary artery disease   . Hemorrhoids   . Hyperlipidemia   . Hypertension   . 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[facility])    negative cardiac cath  . Nicotine addiction   . Peripheral vascular disease ([place])   . Pneumonia   . Shortness of breath dyspnea   . Tobacco abuse     Family History: Family History  Problem Relation Age of Onset  . Hypertension Mother   . Hypertension Father     Social History   Socioeconomic History  . Marital status: Single    Spouse name: Not on file  . Number of children: Not on file  . Years of education: Not on file  . Highest education level: Not on file  Occupational History  . Not on file  Tobacco Use  . Smoking status: Current Every Day Smoker    Packs/day: 1.00    Years: 41.00    Pack years: 41.00    Types: Cigarettes  . Smokeless tobacco: Never Used  . Tobacco comment: pt recommend to stop smoking 4 min spent will start on prn nicotine replacment  Vaping Use  . Vaping Use: Never used  Substance and Sexual Activity  . Alcohol use: Yes    Alcohol/week: 0.0 standard drinks    Comment: weekends, couple beers, pint of liquer only on weekends.  Last drank approx 1 month ago.  . Drug use: No  . Sexual activity: Not Currently  Other Topics Concern  . Not on file  Social History Narrative   Lives with family    Social Determinants of Health   Financial Resource Strain: Not on file  Food Insecurity: Not on file  Transportation Needs: Not on file  Physical Activity: Not on file  Stress: Not on file  Social Connections: Not on file  Intimate Partner Violence: Not on file      Review of Systems  Constitutional: Negative for activity change, chills, fatigue and unexpected weight change.  HENT: Negative for congestion, postnasal drip, rhinorrhea, sneezing and sore throat.   Respiratory:  Positive for shortness of breath. Negative for cough, chest tightness and wheezing.   Cardiovascular: Negative for chest pain and palpitations.  Gastrointestinal: Negative for abdominal pain, constipation, diarrhea, nausea and vomiting.  Endocrine: Negative for cold intolerance, heat intolerance, polydipsia and polyuria.  Musculoskeletal: Negative for arthralgias, back pain, joint swelling and neck pain.  Skin: Negative for rash.  Allergic/Immunologic: Negative for environmental allergies.  Neurological: Negative for dizziness, tremors, numbness and headaches.  Hematological: Negative for adenopathy. Does not bruise/bleed easily.  Psychiatric/Behavioral: Negative for behavioral problems (Depression), sleep disturbance and suicidal ideas. The patient is not nervous/anxious.     Today's Vitals   08/08/20 1036  BP: 92/70  Pulse: 93  Resp: 16  Temp: (!) 97.5 F (36.4 C)  SpO2: 96%  Weight: 121 lb 12.8 oz (55.2 kg)  Height: 5\' 4"  (1.626 m)  Body mass index is 20.91 kg/m.  Physical Exam Vitals and nursing note reviewed.  Constitutional:      General: She is not in acute distress.    Appearance: Normal appearance. She is well-developed and well-nourished. She is not diaphoretic.  HENT:     Head: Normocephalic and atraumatic.     Mouth/Throat:     Mouth: Oropharynx is clear and moist.     Pharynx: No oropharyngeal exudate.  Eyes:     Extraocular Movements: EOM normal.     Pupils: Pupils are equal, round, and reactive to light.  Neck:     Thyroid: No thyromegaly.     Vascular: No carotid bruit or JVD.     Trachea: No tracheal deviation.  Cardiovascular:     Rate and Rhythm: Normal rate and regular rhythm.     Heart sounds: Normal heart sounds. No murmur heard. No friction rub. No gallop.   Pulmonary:     Effort: Pulmonary effort is normal. No respiratory distress.     Breath sounds: Normal breath sounds. No wheezing or rales.  Chest:     Chest wall: No tenderness.   Abdominal:     Palpations: Abdomen is soft.  Musculoskeletal:        General: Normal range of motion.     Cervical back: Normal range of motion and neck supple.  Lymphadenopathy:     Cervical: No cervical adenopathy.  Skin:    General: Skin is warm and dry.  Neurological:     General: No focal deficit present.     Mental Status: She is alert and oriented to person, place, and time.     Cranial Nerves: No cranial nerve deficit.  Psychiatric:        Mood and Affect: Mood and affect and mood normal.        Behavior: Behavior normal.        Thought Content: Thought content normal.        Judgment: Judgment normal.    Assessment/Plan: 1. Essential hypertension Blood pressure low/normal today. Encouraged her to increase water intake and monitor blood pressure closely. Patient to contact office if no improvement or worsening over next few days.   2. Chronic obstructive pulmonary disease, unspecified COPD type ([place]) Currently stable. Continue regular visits with Dr. Devona Konig for management.   3. Coronary artery disease due to lipid rich plaque Most recent CT scan of chest showing diffuse CAD. She is followed routinely by cardiology.   4. Bilateral carotid artery stenosis Patient followed by vein and vascular. Currently on lipitor, repatha, and daily baby aspirin.   General Counseling: Aarion verbalizes understanding of the findings of todays visit and agrees with plan of treatment. I have discussed any further diagnostic evaluation that may be needed or ordered today. We also reviewed her medications today. she has been encouraged to call the office with any questions or concerns that should arise related to todays visit.   This patient was seen by Leretha Pol FNP Collaboration with Dr Lavera Guise as a part of collaborative care agreement  Total time spent: 30 Minutes   Time spent includes review of chart, medications, test results, and follow up plan with the patient.       Dr Lavera Guise Internal medicine

## 2020-08-18 ENCOUNTER — Other Ambulatory Visit: Payer: Self-pay | Admitting: Adult Health

## 2020-08-18 DIAGNOSIS — I10 Essential (primary) hypertension: Secondary | ICD-10-CM

## 2020-08-25 ENCOUNTER — Other Ambulatory Visit: Payer: Self-pay

## 2020-08-25 MED ORDER — EZETIMIBE 10 MG PO TABS: 10.0000 mg | ORAL_TABLET | Freq: Every day | ORAL | 0 refills | Status: DC

## 2020-08-29 ENCOUNTER — Other Ambulatory Visit: Payer: Self-pay

## 2020-08-29 MED ORDER — EZETIMIBE 10 MG PO TABS: 10.0000 mg | ORAL_TABLET | Freq: Every day | ORAL | 1 refills | Status: DC

## 2020-09-07 ENCOUNTER — Other Ambulatory Visit: Payer: Self-pay | Admitting: Adult Health

## 2020-09-07 DIAGNOSIS — I2510 Atherosclerotic heart disease of native coronary artery without angina pectoris: Secondary | ICD-10-CM | POA: Insufficient documentation

## 2020-09-07 DIAGNOSIS — H524 Presbyopia: Secondary | ICD-10-CM | POA: Diagnosis not present

## 2020-09-07 DIAGNOSIS — I2583 Coronary atherosclerosis due to lipid rich plaque: Secondary | ICD-10-CM | POA: Insufficient documentation

## 2020-09-07 DIAGNOSIS — I10 Essential (primary) hypertension: Secondary | ICD-10-CM

## 2020-10-15 ENCOUNTER — Emergency Department
Admission: EM | Admit: 2020-10-15 | Discharge: 2020-10-15 | Disposition: A | Discharge status: Home / Self Care | Payer: Medicare Other | Attending: Emergency Medicine | Admitting: Emergency Medicine

## 2020-10-15 ENCOUNTER — Emergency Department: Payer: Medicare Other

## 2020-10-15 ENCOUNTER — Other Ambulatory Visit: Payer: Self-pay

## 2020-10-15 DIAGNOSIS — M25572 Pain in left ankle and joints of left foot: Secondary | ICD-10-CM | POA: Diagnosis not present

## 2020-10-15 DIAGNOSIS — I2510 Atherosclerotic heart disease of native coronary artery without angina pectoris: Secondary | ICD-10-CM | POA: Diagnosis not present

## 2020-10-15 DIAGNOSIS — S199XXA Unspecified injury of neck, initial encounter: Secondary | ICD-10-CM | POA: Diagnosis not present

## 2020-10-15 DIAGNOSIS — I10 Essential (primary) hypertension: Secondary | ICD-10-CM | POA: Diagnosis not present

## 2020-10-15 DIAGNOSIS — R519 Headache, unspecified: Secondary | ICD-10-CM | POA: Insufficient documentation

## 2020-10-15 DIAGNOSIS — W19XXXA Unspecified fall, initial encounter: Secondary | ICD-10-CM

## 2020-10-15 DIAGNOSIS — Z7982 Long term (current) use of aspirin: Secondary | ICD-10-CM | POA: Diagnosis not present

## 2020-10-15 DIAGNOSIS — X501XXA Overexertion from prolonged static or awkward postures, initial encounter: Secondary | ICD-10-CM | POA: Insufficient documentation

## 2020-10-15 DIAGNOSIS — F17210 Nicotine dependence, cigarettes, uncomplicated: Secondary | ICD-10-CM | POA: Insufficient documentation

## 2020-10-15 DIAGNOSIS — Z743 Need for continuous supervision: Secondary | ICD-10-CM | POA: Diagnosis not present

## 2020-10-15 DIAGNOSIS — S0990XA Unspecified injury of head, initial encounter: Secondary | ICD-10-CM | POA: Diagnosis not present

## 2020-10-15 DIAGNOSIS — I6522 Occlusion and stenosis of left carotid artery: Secondary | ICD-10-CM | POA: Diagnosis not present

## 2020-10-15 DIAGNOSIS — J439 Emphysema, unspecified: Secondary | ICD-10-CM | POA: Diagnosis not present

## 2020-10-15 DIAGNOSIS — J45909 Unspecified asthma, uncomplicated: Secondary | ICD-10-CM | POA: Diagnosis not present

## 2020-10-15 DIAGNOSIS — Z85038 Personal history of other malignant neoplasm of large intestine: Secondary | ICD-10-CM | POA: Insufficient documentation

## 2020-10-15 DIAGNOSIS — Z7951 Long term (current) use of inhaled steroids: Secondary | ICD-10-CM | POA: Diagnosis not present

## 2020-10-15 DIAGNOSIS — M79672 Pain in left foot: Secondary | ICD-10-CM | POA: Diagnosis not present

## 2020-10-15 DIAGNOSIS — Z79899 Other long term (current) drug therapy: Secondary | ICD-10-CM | POA: Insufficient documentation

## 2020-10-15 DIAGNOSIS — J449 Chronic obstructive pulmonary disease, unspecified: Secondary | ICD-10-CM | POA: Insufficient documentation

## 2020-10-15 DIAGNOSIS — S99922A Unspecified injury of left foot, initial encounter: Secondary | ICD-10-CM | POA: Diagnosis not present

## 2020-10-15 LAB — CBC WITH DIFFERENTIAL/PLATELET
Abs Immature Granulocytes: 0.02 10*3/uL (ref 0.00–0.07)
Basophils Absolute: 0 10*3/uL (ref 0.0–0.1)
Basophils Relative: 0 %
Eosinophils Absolute: 0.1 10*3/uL (ref 0.0–0.5)
Eosinophils Relative: 1 %
HCT: 41.5 % (ref 36.0–46.0)
Hemoglobin: 13.8 g/dL (ref 12.0–15.0)
Immature Granulocytes: 0 %
Lymphocytes Relative: 46 %
Lymphs Abs: 2.1 10*3/uL (ref 0.7–4.0)
MCH: 33.7 pg (ref 26.0–34.0)
MCHC: 33.3 g/dL (ref 30.0–36.0)
MCV: 101.2 fL — ABNORMAL HIGH (ref 80.0–100.0)
Monocytes Absolute: 0.2 10*3/uL (ref 0.1–1.0)
Monocytes Relative: 5 %
Neutro Abs: 2.1 10*3/uL (ref 1.7–7.7)
Neutrophils Relative %: 48 %
Platelets: 153 10*3/uL (ref 150–400)
RBC: 4.1 MIL/uL (ref 3.87–5.11)
RDW: 12.8 % (ref 11.5–15.5)
WBC: 4.6 10*3/uL (ref 4.0–10.5)
nRBC: 0 % (ref 0.0–0.2)

## 2020-10-15 LAB — COMPREHENSIVE METABOLIC PANEL
ALT: 24 U/L (ref 0–44)
AST: 32 U/L (ref 15–41)
Albumin: 4.4 g/dL (ref 3.5–5.0)
Alkaline Phosphatase: 54 U/L (ref 38–126)
Anion gap: 11 (ref 5–15)
BUN: 11 mg/dL (ref 8–23)
CO2: 24 mmol/L (ref 22–32)
Calcium: 9.3 mg/dL (ref 8.9–10.3)
Chloride: 104 mmol/L (ref 98–111)
Creatinine, Ser: 0.81 mg/dL (ref 0.44–1.00)
GFR, Estimated: >60 mL/min (ref >60)
Glucose, Bld: 79 mg/dL (ref 70–99)
Potassium: 3.8 mmol/L (ref 3.5–5.1)
Sodium: 139 mmol/L (ref 135–145)
Total Bilirubin: 0.4 mg/dL (ref 0.3–1.2)
Total Protein: 7.8 g/dL (ref 6.5–8.1)

## 2020-10-15 MED ORDER — ACETAMINOPHEN 500 MG PO TABS
1000.0000 mg | ORAL_TABLET | Freq: Once | ORAL | Status: AC
  Administered 2020-10-15: 1000 mg via ORAL
  Filled 2020-10-15: qty 2

## 2020-10-15 MED ORDER — SODIUM CHLORIDE 0.9 % IV BOLUS
1000.0000 mL | Freq: Once | INTRAVENOUS | Status: AC
  Administered 2020-10-15: 1000 mL via INTRAVENOUS

## 2020-10-15 NOTE — ED Notes (Signed)
Left foot elevated and ice applied.

## 2020-10-15 NOTE — ED Triage Notes (Signed)
Pt via EMS from home. Pt had a mechanical fall where she stumbled off the porch. Pt c/o R sided head pain and L ankle pain. Pt is A&Ox4 and NAD. Pt taking baby ASA.

## 2020-10-15 NOTE — Discharge Instructions (Signed)
Cut down your alcohol use as this can increase your risk of fall.  Follow-up with your vascular surgeon due to the CT below showing concern for a plaque on your left carotid artery.  Use Tylenol 1 g every 8 hours to help with pain in your foot.  Your x-ray was negative for fracture but there could just be a lot of bruising.  We provide a postop shoe to help you ambulate.    1. No acute displaced fracture or traumatic listhesis of the cervical spine. 2. Severe left carotid artery atherosclerotic plaque. Recommend carotid ultrasound for further evaluation. 3.  Emphysema (ICD10-J43.9).
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
## 2020-10-15 NOTE — ED Notes (Signed)
Pt provided with sandwich tray and ginger ale

## 2020-12-05 ENCOUNTER — Telehealth: Payer: Self-pay | Admitting: Nurse Practitioner

## 2020-12-05 NOTE — Progress Notes (Signed)
  Chronic Care Management   Outreach Note  12/05/2020 Name: [name] MRN: [id number] DOB: 07-24-1956  Referred by: Ronnell Freshwater, NP Reason for referral : No chief complaint on file.   An unsuccessful telephone outreach was attempted today. The patient was referred to the pharmacist for assistance with care management and care coordination.   Follow Up Plan:   Carley Perdue [employer] Scheduler

## 2020-12-08 ENCOUNTER — Other Ambulatory Visit: Payer: Self-pay | Admitting: Internal Medicine

## 2020-12-08 DIAGNOSIS — E782 Mixed hyperlipidemia: Secondary | ICD-10-CM

## 2020-12-15 ENCOUNTER — Ambulatory Visit: Payer: Medicare Other | Admitting: Hospice and Palliative Medicine

## 2020-12-30 ENCOUNTER — Telehealth: Payer: Self-pay | Admitting: Nurse Practitioner

## 2020-12-30 ENCOUNTER — Other Ambulatory Visit: Payer: Self-pay

## 2020-12-30 ENCOUNTER — Emergency Department
Admission: EM | Admit: 2020-12-30 | Discharge: 2020-12-31 | Disposition: A | Discharge status: Home / Self Care | Payer: Medicare Other | Attending: Emergency Medicine | Admitting: Emergency Medicine

## 2020-12-30 DIAGNOSIS — J45909 Unspecified asthma, uncomplicated: Secondary | ICD-10-CM | POA: Diagnosis not present

## 2020-12-30 DIAGNOSIS — F17210 Nicotine dependence, cigarettes, uncomplicated: Secondary | ICD-10-CM | POA: Insufficient documentation

## 2020-12-30 DIAGNOSIS — F10129 Alcohol abuse with intoxication, unspecified: Secondary | ICD-10-CM | POA: Insufficient documentation

## 2020-12-30 DIAGNOSIS — F10929 Alcohol use, unspecified with intoxication, unspecified: Secondary | ICD-10-CM

## 2020-12-30 DIAGNOSIS — R1084 Generalized abdominal pain: Secondary | ICD-10-CM | POA: Insufficient documentation

## 2020-12-30 DIAGNOSIS — I10 Essential (primary) hypertension: Secondary | ICD-10-CM | POA: Diagnosis not present

## 2020-12-30 DIAGNOSIS — Z955 Presence of coronary angioplasty implant and graft: Secondary | ICD-10-CM | POA: Diagnosis not present

## 2020-12-30 DIAGNOSIS — J449 Chronic obstructive pulmonary disease, unspecified: Secondary | ICD-10-CM | POA: Insufficient documentation

## 2020-12-30 DIAGNOSIS — Z86010 Personal history of colonic polyps: Secondary | ICD-10-CM | POA: Diagnosis not present

## 2020-12-30 DIAGNOSIS — Z79899 Other long term (current) drug therapy: Secondary | ICD-10-CM | POA: Insufficient documentation

## 2020-12-30 DIAGNOSIS — I2510 Atherosclerotic heart disease of native coronary artery without angina pectoris: Secondary | ICD-10-CM | POA: Insufficient documentation

## 2020-12-30 DIAGNOSIS — K449 Diaphragmatic hernia without obstruction or gangrene: Secondary | ICD-10-CM | POA: Diagnosis not present

## 2020-12-30 DIAGNOSIS — R109 Unspecified abdominal pain: Secondary | ICD-10-CM | POA: Diagnosis present

## 2020-12-30 DIAGNOSIS — Z20822 Contact with and (suspected) exposure to covid-19: Secondary | ICD-10-CM | POA: Insufficient documentation

## 2020-12-30 DIAGNOSIS — Z7982 Long term (current) use of aspirin: Secondary | ICD-10-CM | POA: Insufficient documentation

## 2020-12-30 DIAGNOSIS — I728 Aneurysm of other specified arteries: Secondary | ICD-10-CM | POA: Diagnosis not present

## 2020-12-30 DIAGNOSIS — I701 Atherosclerosis of renal artery: Secondary | ICD-10-CM | POA: Diagnosis not present

## 2020-12-30 DIAGNOSIS — I708 Atherosclerosis of other arteries: Secondary | ICD-10-CM | POA: Diagnosis not present

## 2020-12-30 DIAGNOSIS — Z85048 Personal history of other malignant neoplasm of rectum, rectosigmoid junction, and anus: Secondary | ICD-10-CM | POA: Diagnosis not present

## 2020-12-30 DIAGNOSIS — M549 Dorsalgia, unspecified: Secondary | ICD-10-CM | POA: Diagnosis not present

## 2020-12-30 DIAGNOSIS — I959 Hypotension, unspecified: Secondary | ICD-10-CM | POA: Diagnosis not present

## 2020-12-30 NOTE — Progress Notes (Signed)
  Chronic Care Management   Outreach Note  12/30/2020 Name: Charisma Charlot MRN: [id number] DOB: 1956-02-01  Referred by: Ronnell Freshwater, NP Reason for referral : No chief complaint on file.   A second unsuccessful telephone outreach was attempted today. The patient was referred to pharmacist for assistance with care management and care coordination.  Follow Up Plan:    Carley Perdue [employer] Scheduler

## 2020-12-30 NOTE — ED Triage Notes (Signed)
Pt with abd pain, flank pain. Per ems pt b/p 94/67. Ems gave 175mL ns bolus. md at bedside.

## 2020-12-31 ENCOUNTER — Emergency Department: Payer: Medicare Other

## 2020-12-31 DIAGNOSIS — I728 Aneurysm of other specified arteries: Secondary | ICD-10-CM | POA: Diagnosis not present

## 2020-12-31 DIAGNOSIS — I10 Essential (primary) hypertension: Secondary | ICD-10-CM | POA: Diagnosis not present

## 2020-12-31 DIAGNOSIS — R1084 Generalized abdominal pain: Secondary | ICD-10-CM | POA: Diagnosis not present

## 2020-12-31 DIAGNOSIS — I2510 Atherosclerotic heart disease of native coronary artery without angina pectoris: Secondary | ICD-10-CM | POA: Diagnosis not present

## 2020-12-31 DIAGNOSIS — K449 Diaphragmatic hernia without obstruction or gangrene: Secondary | ICD-10-CM | POA: Diagnosis not present

## 2020-12-31 DIAGNOSIS — I701 Atherosclerosis of renal artery: Secondary | ICD-10-CM | POA: Diagnosis not present

## 2020-12-31 DIAGNOSIS — I708 Atherosclerosis of other arteries: Secondary | ICD-10-CM | POA: Diagnosis not present

## 2020-12-31 LAB — URINE DRUG SCREEN, QUALITATIVE (ARMC ONLY)
Amphetamines, Ur Screen: NOT DETECTED
Barbiturates, Ur Screen: NOT DETECTED
Benzodiazepine, Ur Scrn: NOT DETECTED
Cannabinoid 50 Ng, Ur ~~LOC~~: NOT DETECTED
Cocaine Metabolite,Ur ~~LOC~~: NOT DETECTED
MDMA (Ecstasy)Ur Screen: NOT DETECTED
Methadone Scn, Ur: NOT DETECTED
Opiate, Ur Screen: POSITIVE — AB
Phencyclidine (PCP) Ur S: NOT DETECTED
Tricyclic, Ur Screen: NOT DETECTED

## 2020-12-31 LAB — CBC WITH DIFFERENTIAL/PLATELET
Abs Immature Granulocytes: 0.01 10*3/uL (ref 0.00–0.07)
Basophils Absolute: 0 10*3/uL (ref 0.0–0.1)
Basophils Relative: 1 %
Eosinophils Absolute: 0.1 10*3/uL (ref 0.0–0.5)
Eosinophils Relative: 1 %
HCT: 35.7 % — ABNORMAL LOW (ref 36.0–46.0)
Hemoglobin: 12.2 g/dL (ref 12.0–15.0)
Immature Granulocytes: 0 %
Lymphocytes Relative: 30 %
Lymphs Abs: 1.3 10*3/uL (ref 0.7–4.0)
MCH: 35.1 pg — ABNORMAL HIGH (ref 26.0–34.0)
MCHC: 34.2 g/dL (ref 30.0–36.0)
MCV: 102.6 fL — ABNORMAL HIGH (ref 80.0–100.0)
Monocytes Absolute: 0.3 10*3/uL (ref 0.1–1.0)
Monocytes Relative: 7 %
Neutro Abs: 2.7 10*3/uL (ref 1.7–7.7)
Neutrophils Relative %: 61 %
Platelets: 162 10*3/uL (ref 150–400)
RBC: 3.48 MIL/uL — ABNORMAL LOW (ref 3.87–5.11)
RDW: 13.7 % (ref 11.5–15.5)
WBC: 4.4 10*3/uL (ref 4.0–10.5)
nRBC: 0 % (ref 0.0–0.2)

## 2020-12-31 LAB — RESP PANEL BY RT-PCR (FLU A&B, COVID) ARPGX2
Influenza A by PCR: NEGATIVE
Influenza B by PCR: NEGATIVE
SARS Coronavirus 2 by RT PCR: NEGATIVE

## 2020-12-31 LAB — COMPREHENSIVE METABOLIC PANEL
ALT: 28 U/L (ref 0–44)
AST: 35 U/L (ref 15–41)
Albumin: 3.6 g/dL (ref 3.5–5.0)
Alkaline Phosphatase: 50 U/L (ref 38–126)
Anion gap: 12 (ref 5–15)
BUN: 12 mg/dL (ref 8–23)
CO2: 20 mmol/L — ABNORMAL LOW (ref 22–32)
Calcium: 9.1 mg/dL (ref 8.9–10.3)
Chloride: 104 mmol/L (ref 98–111)
Creatinine, Ser: 0.66 mg/dL (ref 0.44–1.00)
GFR, Estimated: >60 mL/min (ref >60)
Glucose, Bld: 88 mg/dL (ref 70–99)
Potassium: 3.5 mmol/L (ref 3.5–5.1)
Sodium: 136 mmol/L (ref 135–145)
Total Bilirubin: 0.3 mg/dL (ref 0.3–1.2)
Total Protein: 6.6 g/dL (ref 6.5–8.1)

## 2020-12-31 LAB — ETHANOL: Alcohol, Ethyl (B): 207 mg/dL — ABNORMAL HIGH (ref <10)

## 2020-12-31 LAB — URINALYSIS, ROUTINE W REFLEX MICROSCOPIC
Bilirubin Urine: NEGATIVE
Glucose, UA: NEGATIVE mg/dL
Hgb urine dipstick: NEGATIVE
Ketones, ur: NEGATIVE mg/dL
Leukocytes,Ua: NEGATIVE
Nitrite: NEGATIVE
Protein, ur: NEGATIVE mg/dL
Specific Gravity, Urine: 1.031 — ABNORMAL HIGH (ref 1.005–1.030)
pH: 5 (ref 5.0–8.0)

## 2020-12-31 LAB — PROTIME-INR
INR: 0.9 (ref 0.8–1.2)
Prothrombin Time: 11.8 seconds (ref 11.4–15.2)

## 2020-12-31 LAB — LIPASE, BLOOD: Lipase: 47 U/L (ref 11–51)

## 2020-12-31 MED ORDER — IOHEXOL 350 MG/ML SOLN
100.0000 mL | Freq: Once | INTRAVENOUS | Status: AC | PRN
  Administered 2020-12-31: 100 mL via INTRAVENOUS

## 2020-12-31 MED ORDER — MORPHINE SULFATE (PF) 4 MG/ML IV SOLN
4.0000 mg | Freq: Once | INTRAVENOUS | Status: AC
  Administered 2020-12-31: 4 mg via INTRAVENOUS
  Filled 2020-12-31: qty 1

## 2020-12-31 MED ORDER — ONDANSETRON HCL 4 MG/2ML IJ SOLN
4.0000 mg | INTRAMUSCULAR | Status: AC
  Administered 2020-12-31: 4 mg via INTRAVENOUS
  Filled 2020-12-31: qty 2

## 2020-12-31 NOTE — Discharge Instructions (Addendum)
You have been seen in the Emergency Department (ED) for abdominal pain.  Your evaluation did not identify a clear cause of your symptoms but was generally reassuring.  Please follow up as instructed above regarding today's emergent visit and the symptoms that are bothering you.  You may want to follow up in vascular clinic; you have what is called a "luminal flap" in the left femoral artery. This is most likely the result of a prior procedure, but you could consider calling your previous vascular surgeon or Dr. Lorenso Courier and call for a follow up appointment.  Return to the ED if your abdominal pain worsens or fails to improve, you develop bloody vomiting, bloody diarrhea, you are unable to tolerate fluids due to vomiting, fever greater than 101, or other symptoms that concern you.
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
## 2021-01-18 ENCOUNTER — Ambulatory Visit: Payer: Medicare Other | Admitting: Internal Medicine

## 2021-01-27 ENCOUNTER — Other Ambulatory Visit: Payer: Self-pay | Admitting: Adult Health

## 2021-01-27 DIAGNOSIS — J449 Chronic obstructive pulmonary disease, unspecified: Secondary | ICD-10-CM

## 2021-02-27 ENCOUNTER — Other Ambulatory Visit: Payer: Self-pay | Admitting: Internal Medicine

## 2021-03-10 ENCOUNTER — Other Ambulatory Visit: Payer: Self-pay | Admitting: Internal Medicine

## 2021-03-10 DIAGNOSIS — E782 Mixed hyperlipidemia: Secondary | ICD-10-CM

## 2021-03-16 ENCOUNTER — Other Ambulatory Visit: Payer: Self-pay | Admitting: Internal Medicine

## 2021-03-31 ENCOUNTER — Ambulatory Visit: Payer: Medicare Other | Admitting: Nurse Practitioner

## 2021-04-04 ENCOUNTER — Other Ambulatory Visit: Payer: Self-pay

## 2021-04-06 ENCOUNTER — Other Ambulatory Visit: Payer: Self-pay

## 2021-04-06 ENCOUNTER — Other Ambulatory Visit: Payer: Self-pay | Admitting: Internal Medicine

## 2021-04-06 DIAGNOSIS — E782 Mixed hyperlipidemia: Secondary | ICD-10-CM

## 2021-04-07 ENCOUNTER — Other Ambulatory Visit: Payer: Self-pay

## 2021-04-07 ENCOUNTER — Telehealth: Payer: Self-pay

## 2021-04-07 DIAGNOSIS — E782 Mixed hyperlipidemia: Secondary | ICD-10-CM

## 2021-04-07 MED ORDER — ATORVASTATIN CALCIUM 80 MG PO TABS: 80.0000 mg | ORAL_TABLET | Freq: Every day | ORAL | 0 refills | Status: DC

## 2021-04-07 NOTE — Telephone Encounter (Signed)
ATTEMPTED TO CONTACT PATIENT TO SCHEDULE FOR A F/U AS THEY WERE REQUESTING REFILLS ON ATORVASTATIN. SHE NEEDS AN APPT AS SHE HAS NO SHOWED SEVERAL APPTS, AND HAS NOT BEEN SEEN SINCE December 2021.   PATIENTS PHONE RANG WITH NO OPTION TO LEAVE A VM.

## 2021-04-15 ENCOUNTER — Other Ambulatory Visit: Payer: Self-pay | Admitting: Adult Health

## 2021-04-15 DIAGNOSIS — J449 Chronic obstructive pulmonary disease, unspecified: Secondary | ICD-10-CM

## 2021-05-04 DIAGNOSIS — R079 Chest pain, unspecified: Secondary | ICD-10-CM | POA: Diagnosis not present

## 2021-05-04 DIAGNOSIS — I340 Nonrheumatic mitral (valve) insufficiency: Secondary | ICD-10-CM | POA: Diagnosis not present

## 2021-05-04 DIAGNOSIS — F17210 Nicotine dependence, cigarettes, uncomplicated: Secondary | ICD-10-CM | POA: Diagnosis not present

## 2021-05-04 DIAGNOSIS — I739 Peripheral vascular disease, unspecified: Secondary | ICD-10-CM | POA: Diagnosis not present

## 2021-05-04 DIAGNOSIS — I351 Nonrheumatic aortic (valve) insufficiency: Secondary | ICD-10-CM | POA: Diagnosis not present

## 2021-05-04 DIAGNOSIS — I2510 Atherosclerotic heart disease of native coronary artery without angina pectoris: Secondary | ICD-10-CM | POA: Diagnosis not present

## 2021-05-04 DIAGNOSIS — I10 Essential (primary) hypertension: Secondary | ICD-10-CM | POA: Diagnosis not present

## 2021-05-19 ENCOUNTER — Ambulatory Visit (INDEPENDENT_AMBULATORY_CARE_PROVIDER_SITE_OTHER): Payer: Medicare Other | Admitting: Nurse Practitioner

## 2021-05-19 ENCOUNTER — Encounter: Payer: Self-pay | Admitting: Nurse Practitioner

## 2021-05-19 ENCOUNTER — Other Ambulatory Visit: Payer: Self-pay

## 2021-05-19 ENCOUNTER — Telehealth: Payer: Self-pay

## 2021-05-19 VITALS — BP 103/80 | HR 95 | Temp 98.0°F | Resp 16 | Ht 64.0 in | Wt 112.0 lb

## 2021-05-19 DIAGNOSIS — Z780 Asymptomatic menopausal state: Secondary | ICD-10-CM

## 2021-05-19 DIAGNOSIS — I2583 Coronary atherosclerosis due to lipid rich plaque: Secondary | ICD-10-CM

## 2021-05-19 DIAGNOSIS — I10 Essential (primary) hypertension: Secondary | ICD-10-CM

## 2021-05-19 DIAGNOSIS — Z0001 Encounter for general adult medical examination with abnormal findings: Secondary | ICD-10-CM | POA: Diagnosis not present

## 2021-05-19 DIAGNOSIS — F17219 Nicotine dependence, cigarettes, with unspecified nicotine-induced disorders: Secondary | ICD-10-CM

## 2021-05-19 DIAGNOSIS — R300 Dysuria: Secondary | ICD-10-CM

## 2021-05-19 DIAGNOSIS — E559 Vitamin D deficiency, unspecified: Secondary | ICD-10-CM | POA: Diagnosis not present

## 2021-05-19 DIAGNOSIS — I70213 Atherosclerosis of native arteries of extremities with intermittent claudication, bilateral legs: Secondary | ICD-10-CM | POA: Diagnosis not present

## 2021-05-19 DIAGNOSIS — E782 Mixed hyperlipidemia: Secondary | ICD-10-CM

## 2021-05-19 DIAGNOSIS — I2510 Atherosclerotic heart disease of native coronary artery without angina pectoris: Secondary | ICD-10-CM | POA: Diagnosis not present

## 2021-05-19 DIAGNOSIS — J449 Chronic obstructive pulmonary disease, unspecified: Secondary | ICD-10-CM

## 2021-05-19 DIAGNOSIS — Z1231 Encounter for screening mammogram for malignant neoplasm of breast: Secondary | ICD-10-CM | POA: Diagnosis not present

## 2021-05-19 DIAGNOSIS — F1010 Alcohol abuse, uncomplicated: Secondary | ICD-10-CM

## 2021-05-19 MED ORDER — BUDESONIDE-FORMOTEROL FUMARATE 80-4.5 MCG/ACT IN AERO: 2.0000 | INHALATION_SPRAY | Freq: Every day | RESPIRATORY_TRACT | 1 refills | Status: DC

## 2021-05-19 MED ORDER — ATORVASTATIN CALCIUM 80 MG PO TABS: 80.0000 mg | ORAL_TABLET | Freq: Every day | ORAL | 1 refills | Status: DC

## 2021-05-19 MED ORDER — ALBUTEROL SULFATE HFA 108 (90 BASE) MCG/ACT IN AERS
2.0000 | INHALATION_SPRAY | Freq: Four times a day (QID) | RESPIRATORY_TRACT | 3 refills | Status: DC | PRN
Start: 2021-05-19 — End: 2022-12-26

## 2021-05-19 MED ORDER — LOSARTAN POTASSIUM 50 MG PO TABS: 50.0000 mg | ORAL_TABLET | Freq: Every day | ORAL | 1 refills | Status: DC

## 2021-05-19 MED ORDER — EZETIMIBE 10 MG PO TABS: 10.0000 mg | ORAL_TABLET | Freq: Every day | ORAL | 1 refills | Status: DC

## 2021-05-19 MED ORDER — ISOSORBIDE MONONITRATE ER 30 MG PO TB24: 30.0000 mg | ORAL_TABLET | Freq: Every day | ORAL | 1 refills | Status: DC

## 2021-05-19 NOTE — Progress Notes (Signed)
[facility] [place], [place] [address]  Internal MEDICINE  Office Visit Note  Patient Name: [name]  [id number]  [id number]  Date of Service: 05/19/2021  Chief Complaint  Patient presents with  . Medicare Wellness    HPI [name] presents for an annual well visit and physical exam. she has a history of allergies, arthritis, asthma, COPD, hyperlipidemia, hypertension, MI, carotid artery stenosis, CAD, and PVD. Surgical history significant for appendectomy, cholecystectomy, and hysterectomy. She has had 3 doses of the covid vaccine. She lives at home with her 2 daughters and grandson. She denies any pain. Her routine screening colonoscopy is due in 2026. She is an everyday smoker and smokes about 1-2 ppd. She is not interested in smoking cessation counseling at this time. She drinks alcohol on weekends, usually a 24 oz beer and 2-3 shots of whiskey.  She is scheduled for a stress test on Monday.  She is due for BMD screening  and mammogram.    Current Medication: Outpatient Encounter Medications as of 05/19/2021  Medication Sig  . aspirin EC 81 MG EC tablet Take 1 tablet (81 mg total) by mouth daily.  . cholecalciferol (VITAMIN D) 400 units TABS tablet Take 400 Units by mouth.  . Evolocumab (REPATHA) 140 MG/ML SOSY Use as directed by cardiology  . ferrous sulfate 325 (65 FE) MG tablet Take 325 mg by mouth daily.  . [DISCONTINUED] albuterol (VENTOLIN HFA) 108 (90 Base) MCG/ACT inhaler Inhale 2 puffs into the lungs every 6 (six) hours as needed for wheezing or shortness of breath.  . [DISCONTINUED] atorvastatin (LIPITOR) 80 MG tablet Take 1 tablet (80 mg total) by mouth daily.  . [DISCONTINUED] budesonide-formoterol (SYMBICORT) 80-4.5 MCG/ACT inhaler Inhale 2 puffs into the lungs daily.  . [DISCONTINUED] cetirizine (ZYRTEC) 10 MG tablet Take 10 mg by mouth daily.  . [DISCONTINUED] ezetimibe (ZETIA) 10 MG tablet Take 1 tablet by mouth once daily  .  [DISCONTINUED] isosorbide mononitrate (IMDUR) 30 MG 24 hr tablet Take 30 mg by mouth daily.  . [DISCONTINUED] losartan (COZAAR) 50 MG tablet Take 1 tablet (50 mg total) by mouth daily.  Marland Kitchen albuterol (VENTOLIN HFA) 108 (90 Base) MCG/ACT inhaler Inhale 2 puffs into the lungs every 6 (six) hours as needed for wheezing or shortness of breath.  Marland Kitchen atorvastatin (LIPITOR) 80 MG tablet Take 1 tablet (80 mg total) by mouth daily.  . budesonide-formoterol (SYMBICORT) 80-4.5 MCG/ACT inhaler Inhale 2 puffs into the lungs daily.  Marland Kitchen ezetimibe (ZETIA) 10 MG tablet Take 1 tablet (10 mg total) by mouth daily.  . isosorbide mononitrate (IMDUR) 30 MG 24 hr tablet Take 1 tablet (30 mg total) by mouth daily.  Marland Kitchen losartan (COZAAR) 50 MG tablet Take 1 tablet (50 mg total) by mouth daily.   No facility-administered encounter medications on file as of 05/19/2021.    Surgical History: Past Surgical History:  Procedure Laterality Date  . ABDOMINAL HYSTERECTOMY    . APPENDECTOMY    . CARDIAC CATHETERIZATION    . CARDIAC SURGERY    . CHOLECYSTECTOMY    . COLONOSCOPY  12/15/11   OH->bleeding internal hemorrhoids, otherwise normal  . COLONOSCOPY WITH PROPOFOL N/A 04/12/2015   Procedure: COLONOSCOPY WITH PROPOFOL;  Surgeon: Lucilla Lame, MD;  Location: [facility] ENDOSCOPY;  Service: Endoscopy;  Laterality: N/A;  . COLONOSCOPY WITH PROPOFOL N/A 01/05/2020   Procedure: COLONOSCOPY WITH PROPOFOL;  Surgeon: Lucilla Lame, MD;  Location[facility]COPY;  Service: Endoscopy;  Laterality: N/A;  . ENDARTERECTOMY FEMORAL Left 04/26/2015  Procedure: ENDARTERECTOMY FEMORAL;  Surgeon: Algernon Huxley, MD;  Location: [facility] ORS;  Service: Vascular;  Laterality: Left;  . ENDOVASCULAR STENT INSERTION Bilateral    Legs  . FLEXIBLE BRONCHOSCOPY N/A 02/02/2015   Procedure: FLEXIBLE BRONCHOSCOPY;  Surgeon: Allyne Gee, MD;  Location: [facility] ORS;  Service: Pulmonary;  Laterality: N/A;  . HEMORRHOID SURGERY    . PERIPHERAL VASCULAR CATHETERIZATION Left  04/25/2015   Procedure: Lower Extremity Angiography;  Surgeon: Algernon Huxley, MD;  Location: [place] [facility];  Service: Cardiovascular;  Laterality: Left;  . PERIPHERAL VASCULAR CATHETERIZATION Left 04/25/2015   Procedure: Lower Extremity Intervention;  Surgeon: Algernon Huxley, MD;  Location: [place] [facility];  Service: Cardiovascular;  Laterality: Left;  . PERIPHERAL VASCULAR CATHETERIZATION Left 01/02/2016   Procedure: Lower Extremity Angiography;  Surgeon: Algernon Huxley, MD;  Location: [place] [facility];  Service: Cardiovascular;  Laterality: Left;  . PERIPHERAL VASCULAR CATHETERIZATION  01/02/2016   Procedure: Lower Extremity Intervention;  Surgeon: Algernon Huxley, MD;  Location: [place] [facility];  Service: Cardiovascular;;  . THROMBECTOMY FEMORAL ARTERY Left 04/26/2015   Procedure: THROMBECTOMY FEMORAL ARTERY;  Surgeon: Algernon Huxley, MD;  Location: [facility] ORS;  Service: Vascular;  Laterality: Left;    Medical History: Past Medical History:  Diagnosis Date  . Allergy    Environmental  . Arthritis   . Asthma   . Carotid artery stenosis   . Carotid stenosis   . COPD (chronic obstructive pulmonary disease) ([place])   . Coronary artery disease   . Coronary artery disease   . Hemorrhoids   . Hyperlipidemia   . Hypertension   . 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[facility])    negative cardiac cath  . Nicotine addiction   . Peripheral vascular disease ([place])   . Pneumonia   . Shortness of breath dyspnea   . Tobacco abuse     Family History: Family History  Problem Relation Age of Onset  . Hypertension Mother   . Hypertension Father     Social History   Socioeconomic History  . Marital status: Single    Spouse name: Not on file  . Number of children: Not on file  . Years of education: Not on file  . Highest education level: Not on file  Occupational History  . Not on file  Tobacco Use  . Smoking status: Every Day    Packs/day: 1.00    Years: 41.00    Pack years: 41.00    Types: Cigarettes   . Smokeless tobacco: Never  . Tobacco comments:    pt recommend to stop smoking 4 min spent will start on prn nicotine replacment  Vaping Use  . Vaping Use: Never used  Substance and Sexual Activity  . Alcohol use: Yes    Alcohol/week: 0.0 standard drinks    Comment: weekends, couple beers, pint of liquer only on weekends.  Last drank approx 1 month ago.  . Drug use: No  . Sexual activity: Not Currently  Other Topics Concern  . Not on file  Social History Narrative   Lives with family    Social Determinants of Health   Financial Resource Strain: Not on file  Food Insecurity: Not on file  Transportation Needs: Not on file  Physical Activity: Not on file  Stress: Not on file  Social Connections: Not on file  Intimate Partner Violence: Not on file      Review of Systems  Constitutional:  Negative for activity change, appetite change, chills,  fatigue, fever and unexpected weight change.  HENT: Negative.  Negative for congestion, ear pain, rhinorrhea, sore throat and trouble swallowing.   Eyes: Negative.   Respiratory: Negative.  Negative for cough, chest tightness, shortness of breath and wheezing.   Cardiovascular: Negative.  Negative for chest pain.  Gastrointestinal: Negative.  Negative for abdominal pain, blood in stool, constipation, diarrhea, nausea and vomiting.  Endocrine: Negative.   Genitourinary: Negative.  Negative for difficulty urinating, dysuria, frequency, hematuria and urgency.  Musculoskeletal: Negative.  Negative for arthralgias, back pain, joint swelling, myalgias and neck pain.  Skin: Negative.  Negative for rash and wound.  Allergic/Immunologic: Negative.  Negative for immunocompromised state.  Neurological: Negative.  Negative for dizziness, seizures, numbness and headaches.  Hematological: Negative.   Psychiatric/Behavioral: Negative.  Negative for behavioral problems, self-injury and suicidal ideas. The patient is not nervous/anxious.    Vital  Signs: BP 103/80   Pulse 95   Temp 98 F (36.7 C)   Resp 16   Ht 5' 4"  (1.626 m)   Wt 112 lb (50.8 kg)   LMP  (LMP Unknown)   SpO2 97%   BMI 19.22 kg/m    Physical Exam Vitals reviewed.  Constitutional:      General: She is awake. She is not in acute distress.    Appearance: Normal appearance. She is well-developed, well-groomed and normal weight. She is not ill-appearing or diaphoretic.  HENT:     Head: Normocephalic and atraumatic.     Right Ear: Tympanic membrane, ear canal and external ear normal.     Left Ear: Ear canal and external ear normal. There is impacted cerumen.     Nose: Nose normal. No congestion or rhinorrhea.     Mouth/Throat:     Mouth: Mucous membranes are moist.     Dentition: Abnormal dentition (missing teeth).     Pharynx: Oropharynx is clear. Uvula midline. No oropharyngeal exudate or posterior oropharyngeal erythema.  Eyes:     General: Lids are normal. Vision grossly intact. Gaze aligned appropriately. No scleral icterus.       Right eye: No discharge.        Left eye: No discharge.     Extraocular Movements: Extraocular movements intact.     Conjunctiva/sclera: Conjunctivae normal.     Pupils: Pupils are equal, round, and reactive to light.     Funduscopic exam:    Right eye: Red reflex present.        Left eye: Red reflex present. Neck:     Thyroid: No thyromegaly.     Vascular: No carotid bruit or JVD.     Trachea: Trachea and phonation normal. No tracheal deviation.  Cardiovascular:     Rate and Rhythm: Normal rate and regular rhythm.     Pulses: Normal pulses.     Heart sounds: Normal heart sounds, S1 normal and S2 normal. No murmur heard.   No friction rub. No gallop.  Pulmonary:     Effort: Pulmonary effort is normal. No accessory muscle usage or respiratory distress.     Breath sounds: Normal breath sounds and air entry. No stridor. No wheezing or rales.  Chest:     Chest wall: No tenderness.     Comments: Declined clinical breast  exam, will get mammogram Abdominal:     General: Bowel sounds are normal. There is no distension.     Palpations: Abdomen is soft. There is no shifting dullness, fluid wave, mass or pulsatile mass.     Tenderness: There  is no abdominal tenderness. There is no guarding or rebound.  Musculoskeletal:        General: No tenderness or deformity. Normal range of motion.     Cervical back: Normal range of motion and neck supple.     Right lower leg: No edema.     Left lower leg: No edema.  Lymphadenopathy:     Cervical: No cervical adenopathy.  Skin:    General: Skin is warm and dry.     Capillary Refill: Capillary refill takes less than 2 seconds.     Coloration: Skin is not pale.     Findings: No erythema or rash.  Neurological:     Mental Status: She is alert and oriented to person, place, and time.     Cranial Nerves: No cranial nerve deficit.     Motor: No abnormal muscle tone.     Coordination: Coordination normal.     Deep Tendon Reflexes: Reflexes are normal and symmetric.  Psychiatric:        Mood and Affect: Mood and affect normal.        Behavior: Behavior normal. Behavior is cooperative.        Thought Content: Thought content normal.        Judgment: Judgment normal.       Assessment/Plan: 1. Encounter for routine adult health examination with abnormal findings Age-appropriate preventive screenings and vaccinations discussed, annual physical exam completed. Routine labs for health maintenance ordered see below. PHM updated. Refills of medications ordered. Due for mammogram and BMD. Colonoscopy due in 2026.  - isosorbide mononitrate (IMDUR) 30 MG 24 hr tablet; Take 1 tablet (30 mg total) by mouth daily.  Dispense: 90 tablet; Refill: 1  2. Chronic obstructive pulmonary disease, unspecified COPD type ([place]) Stable, uses symbicort for maintenance and is requesting refill of rescue inhaler.  - albuterol (VENTOLIN HFA) 108 (90 Base) MCG/ACT inhaler; Inhale 2 puffs into the  lungs every 6 (six) hours as needed for wheezing or shortness of breath.  Dispense: 18 g; Refill: 3 - budesonide-formoterol (SYMBICORT) 80-4.5 MCG/ACT inhaler; Inhale 2 puffs into the lungs daily.  Dispense: 3 each; Refill: 1  3. Essential hypertension Stable with current medication, refill ordered, continue as prescribed. - losartan (COZAAR) 50 MG tablet; Take 1 tablet (50 mg total) by mouth daily.  Dispense: 90 tablet; Refill: 1  4. Asymptomatic postmenopausal state Routine labs and BMD screening ordered.  - DG Bone Density; Future - CBC with Differential/Platelet - CMP14+EGFR - B12 and Folate Panel  5. Mixed hyperlipidemia Refills of current medications ordered - atorvastatin (LIPITOR) 80 MG tablet; Take 1 tablet (80 mg total) by mouth daily.  Dispense: 90 tablet; Refill: 1 - ezetimibe (ZETIA) 10 MG tablet; Take 1 tablet (10 mg total) by mouth daily.  Dispense: 90 tablet; Refill: 1  6. Coronary artery disease due to lipid rich plaque Routine labs ordered.  - Lipid Profile - TSH + free T4  7. Atherosclerosis of native artery of both lower extremities with intermittent claudication ([place]) Has history of PVD, followed by vascular surgery, last seen October 2021. Stable at this time.   8. Vitamin D deficiency Rule out low vitamin D - Vitamin D (25 hydroxy)  9. Alcohol abuse Discussed alcohol use, does not acknowledge an issue at this time. Will periodically monitor liver function with labs at least annually.   10. Cigarette nicotine dependence with nicotine-induced disorder Current every day smoker, declined smoking cessation counseling. Not ready to quit, acknowledges risks of continued  tobacco use.   11. Encounter for screening mammogram for malignant neoplasm of breast Routine screening mammogram ordered.  - MM 3D SCREEN BREAST BILATERAL; Future  12. Dysuria Outine urinalysis done - UA/M w/rflx Culture, Routine - Microscopic Examination      General Counseling:  [name] verbalizes understanding of the findings of todays visit and agrees with plan of treatment. I have discussed any further diagnostic evaluation that may be needed or ordered today. We also reviewed her medications today. she has been encouraged to call the office with any questions or concerns that should arise related to todays visit.    Orders Placed This Encounter  Procedures  . Microscopic Examination  . DG Bone Density  . MM 3D SCREEN BREAST BILATERAL  . CBC with Differential/Platelet  . CMP14+EGFR  . B12 and Folate Panel  . Lipid Profile  . TSH + free T4  . Vitamin D (25 hydroxy)  . UA/M w/rflx Culture, Routine    Meds ordered this encounter  Medications  . albuterol (VENTOLIN HFA) 108 (90 Base) MCG/ACT inhaler    Sig: Inhale 2 puffs into the lungs every 6 (six) hours as needed for wheezing or shortness of breath.    Dispense:  18 g    Refill:  3    Please fill with generic alternative preferred by her insurance.  Marland Kitchen atorvastatin (LIPITOR) 80 MG tablet    Sig: Take 1 tablet (80 mg total) by mouth daily.    Dispense:  90 tablet    Refill:  1    Pt needs appt for future refills  . budesonide-formoterol (SYMBICORT) 80-4.5 MCG/ACT inhaler    Sig: Inhale 2 puffs into the lungs daily.    Dispense:  3 each    Refill:  1  . isosorbide mononitrate (IMDUR) 30 MG 24 hr tablet    Sig: Take 1 tablet (30 mg total) by mouth daily.    Dispense:  90 tablet    Refill:  1  . losartan (COZAAR) 50 MG tablet    Sig: Take 1 tablet (50 mg total) by mouth daily.    Dispense:  90 tablet    Refill:  1  . ezetimibe (ZETIA) 10 MG tablet    Sig: Take 1 tablet (10 mg total) by mouth daily.    Dispense:  90 tablet    Refill:  1    Return in about 4 months (around 09/18/2021) for F/U, med refill, [name] PCP.   Total time spent:30 Minutes Time spent includes review of chart, medications, test results, and follow up plan with the patient.   [place] Controlled Substance Database was reviewed  by me.  This patient was seen by Jonetta Osgood, FNP-C in collaboration with Dr. Clayborn Bigness as a part of collaborative care agreement.  [name] R. Valetta Fuller, MSN, FNP-C Internal medicine

## 2021-05-19 NOTE — Telephone Encounter (Signed)
Mammogram & bone density scheduled on 06/21/21 @ 10:00 by [employer]. Notified patient-[name]

## 2021-05-20 LAB — UA/M W/RFLX CULTURE, ROUTINE
Bilirubin, UA: NEGATIVE
Glucose, UA: NEGATIVE
Leukocytes,UA: NEGATIVE
Nitrite, UA: NEGATIVE
RBC, UA: NEGATIVE
Specific Gravity, UA: 1.024 (ref 1.005–1.030)
Urobilinogen, Ur: 1 mg/dL (ref 0.2–1.0)
pH, UA: 5 (ref 5.0–7.5)

## 2021-05-20 LAB — MICROSCOPIC EXAMINATION
Casts: NONE SEEN /lpf
Epithelial Cells (non renal): >10 /hpf — AB (ref 0–10)

## 2021-05-22 DIAGNOSIS — R079 Chest pain, unspecified: Secondary | ICD-10-CM | POA: Diagnosis not present

## 2021-06-05 DIAGNOSIS — I739 Peripheral vascular disease, unspecified: Secondary | ICD-10-CM | POA: Diagnosis not present

## 2021-06-05 DIAGNOSIS — I10 Essential (primary) hypertension: Secondary | ICD-10-CM | POA: Diagnosis not present

## 2021-06-05 DIAGNOSIS — I2510 Atherosclerotic heart disease of native coronary artery without angina pectoris: Secondary | ICD-10-CM | POA: Diagnosis not present

## 2021-06-05 DIAGNOSIS — I340 Nonrheumatic mitral (valve) insufficiency: Secondary | ICD-10-CM | POA: Diagnosis not present

## 2021-06-05 DIAGNOSIS — F17200 Nicotine dependence, unspecified, uncomplicated: Secondary | ICD-10-CM | POA: Diagnosis not present

## 2021-06-05 DIAGNOSIS — I351 Nonrheumatic aortic (valve) insufficiency: Secondary | ICD-10-CM | POA: Diagnosis not present

## 2021-06-13 ENCOUNTER — Other Ambulatory Visit (INDEPENDENT_AMBULATORY_CARE_PROVIDER_SITE_OTHER): Payer: Self-pay | Admitting: Nurse Practitioner

## 2021-06-13 DIAGNOSIS — I739 Peripheral vascular disease, unspecified: Secondary | ICD-10-CM

## 2021-06-13 DIAGNOSIS — I6523 Occlusion and stenosis of bilateral carotid arteries: Secondary | ICD-10-CM

## 2021-06-14 ENCOUNTER — Ambulatory Visit (INDEPENDENT_AMBULATORY_CARE_PROVIDER_SITE_OTHER): Payer: Medicare Other

## 2021-06-14 ENCOUNTER — Other Ambulatory Visit: Payer: Self-pay

## 2021-06-14 ENCOUNTER — Encounter (INDEPENDENT_AMBULATORY_CARE_PROVIDER_SITE_OTHER): Payer: Self-pay | Admitting: Nurse Practitioner

## 2021-06-14 ENCOUNTER — Ambulatory Visit (INDEPENDENT_AMBULATORY_CARE_PROVIDER_SITE_OTHER): Payer: Medicare Other | Admitting: Nurse Practitioner

## 2021-06-14 VITALS — BP 184/86 | HR 76 | Resp 16 | Wt 111.4 lb

## 2021-06-14 DIAGNOSIS — I6523 Occlusion and stenosis of bilateral carotid arteries: Secondary | ICD-10-CM | POA: Diagnosis not present

## 2021-06-14 DIAGNOSIS — I70213 Atherosclerosis of native arteries of extremities with intermittent claudication, bilateral legs: Secondary | ICD-10-CM

## 2021-06-14 DIAGNOSIS — F17200 Nicotine dependence, unspecified, uncomplicated: Secondary | ICD-10-CM | POA: Diagnosis not present

## 2021-06-14 DIAGNOSIS — G458 Other transient cerebral ischemic attacks and related syndromes: Secondary | ICD-10-CM

## 2021-06-14 DIAGNOSIS — I10 Essential (primary) hypertension: Secondary | ICD-10-CM

## 2021-06-14 DIAGNOSIS — E782 Mixed hyperlipidemia: Secondary | ICD-10-CM | POA: Diagnosis not present

## 2021-06-14 DIAGNOSIS — I739 Peripheral vascular disease, unspecified: Secondary | ICD-10-CM

## 2021-06-14 NOTE — Progress Notes (Signed)
Subjective:    Patient ID: [name], female    DOB: 12/15/1955, 65 y.o.   MRN: [id number] Chief Complaint  Patient presents with  . Follow-up    Ultrasound follow up    [name] is a 65 year old female returns to the office for followup and review of the noninvasive studies. There have been no interval changes in lower extremity symptoms. No interval shortening of the patient's claudication distance or development of rest pain symptoms. No new ulcers or wounds have occurred since the last visit.  There have been no significant changes to the patient's overall health care.  The patient denies amaurosis fugax or recent TIA symptoms. There are no recent neurological changes noted. The patient denies history of DVT, PE or superficial thrombophlebitis. The patient denies recent episodes of angina or shortness of breath.   ABI Rt=1.04 and Lt=1.10 (previous ABI's Rt=1.04 and Lt=0.98) Duplex ultrasound of the right tibial arteries reveals triphasic waveforms in the anterior tibial artery with absent waveforms in the posterior tibial artery.  The left tibial arteries have biphasic  waveforms.  Patient does have good toe waveforms bilaterally.  The patient is seen for follow up evaluation of carotid stenosis. The carotid stenosis followed by ultrasound.  Patient denies any dizziness.   Carotid Duplex done today shows 1 to 39% stenosis in the bilateral internal carotid arteries.  There is slight change compared to the previous study last year.  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  The mild vertebral retrograde flow in the left vertebral artery is also suggestive of subclavian steal.This was previously known.  Review of Systems  Eyes:  Negative for visual disturbance.  Neurological:  Negative for dizziness and facial asymmetry.  All other systems reviewed and are negative.     Objective:    Physical Exam Vitals reviewed.  HENT:     Head: Normocephalic.  Cardiovascular:     Rate and Rhythm: Normal rate.     Pulses: Normal pulses.  Pulmonary:     Effort: Pulmonary effort is normal.  Neurological:     Mental Status: She is alert and oriented to person, place, and time.  Psychiatric:        Mood and Affect: Mood normal.        Behavior: Behavior normal.        Thought Content: Thought content normal.        Judgment: Judgment normal.    BP (!) 184/86 (BP Location: Right Arm)   Pulse 76   Resp 16   Wt 111 lb 6.4 oz (50.5 kg)   LMP  (LMP Unknown)   BMI 19.12 kg/m   Past Medical History:  Diagnosis Date  . Allergy    Environmental  . Arthritis   . Asthma   . Carotid artery stenosis   . Carotid stenosis   . COPD (chronic obstructive pulmonary disease) ([place])   . Coronary artery disease   . Coronary artery disease   . Hemorrhoids   . Hyperlipidemia   . Hypertension   . 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[facility])    negative cardiac cath  . Nicotine addiction   . Peripheral vascular disease ([place])   . Pneumonia   . Shortness of breath dyspnea   . Tobacco abuse     Social History   Socioeconomic History  . Marital status: Single    Spouse name: Not on file  . Number of  children: Not on file  . Years of education: Not on file  . Highest education level: Not on file  Occupational History  . Not on file  Tobacco Use  . Smoking status: Every Day    Packs/day: 1.00    Years: 41.00    Pack years: 41.00    Types: Cigarettes  . Smokeless tobacco: Never  . Tobacco comments:    pt recommend to stop smoking 4 min spent will start on prn nicotine replacment  Vaping Use  . Vaping Use: Never used  Substance and Sexual Activity  . Alcohol use: Yes    Alcohol/week: 0.0 standard drinks    Comment: weekends, couple beers, pint of liquer only on weekends.  Last drank approx 1 month ago.  . Drug use: No  . Sexual activity: Not Currently  Other Topics Concern  . Not on file   Social History Narrative   Lives with family    Social Determinants of Health   Financial Resource Strain: Not on file  Food Insecurity: Not on file  Transportation Needs: Not on file  Physical Activity: Not on file  Stress: Not on file  Social Connections: Not on file  Intimate Partner Violence: Not on file    Past Surgical History:  Procedure Laterality Date  . ABDOMINAL HYSTERECTOMY    . APPENDECTOMY    . CARDIAC CATHETERIZATION    . CARDIAC SURGERY    . CHOLECYSTECTOMY    . COLONOSCOPY  12/15/11   OH->bleeding internal hemorrhoids, otherwise normal  . COLONOSCOPY WITH PROPOFOL N/A 04/12/2015   Procedure: COLONOSCOPY WITH PROPOFOL;  Surgeon: Lucilla Lame, MD;  Location: [facility] ENDOSCOPY;  Service: Endoscopy;  Laterality: N/A;  . COLONOSCOPY WITH PROPOFOL N/A 01/05/2020   Procedure: COLONOSCOPY WITH PROPOFOL;  Surgeon: Lucilla Lame, MD;  Location: [facility] ENDOSCOPY;  Service: Endoscopy;  Laterality: N/A;  . ENDARTERECTOMY FEMORAL Left 04/26/2015   Procedure: ENDARTERECTOMY FEMORAL;  Surgeon: Algernon Huxley, MD;  Location: [facility] ORS;  Service: Vascular;  Laterality: Left;  . ENDOVASCULAR STENT INSERTION Bilateral    Legs  . FLEXIBLE BRONCHOSCOPY N/A 02/02/2015   Procedure: FLEXIBLE BRONCHOSCOPY;  Surgeon: Allyne Gee, MD;  Location: [facility] ORS;  Service: Pulmonary;  Laterality: N/A;  . HEMORRHOID SURGERY    . PERIPHERAL VASCULAR CATHETERIZATION Left 04/25/2015   Procedure: Lower Extremity Angiography;  Surgeon: Algernon Huxley, MD;  Location: [place] CV LAB;  Service: Cardiovascular;  Laterality: Left;  . PERIPHERAL VASCULAR CATHETERIZATION Left 04/25/2015   Procedure: Lower Extremity Intervention;  Surgeon: Algernon Huxley, MD;  Location: [place] CV LAB;  Service: Cardiovascular;  Laterality: Left;  . PERIPHERAL VASCULAR CATHETERIZATION Left 01/02/2016   Procedure: Lower Extremity Angiography;  Surgeon: Algernon Huxley, MD;  Location: [place] CV LAB;  Service: Cardiovascular;  Laterality:  Left;  . PERIPHERAL VASCULAR CATHETERIZATION  01/02/2016   Procedure: Lower Extremity Intervention;  Surgeon: Algernon Huxley, MD;  Location: [place] CV LAB;  Service: Cardiovascular;;  . THROMBECTOMY FEMORAL ARTERY Left 04/26/2015   Procedure: THROMBECTOMY FEMORAL ARTERY;  Surgeon: Algernon Huxley, MD;  Location: [facility] ORS;  Service: Vascular;  Laterality: Left;    Family History  Problem Relation Age of Onset  . Hypertension Mother   . Hypertension Father     Allergies  Allergen Reactions  . Aspirin Nausea And Vomiting and Other (See Comments)    Pt states aspirin makes her cramp and have to use coated kind.  . Tramadol   . Zyrtec [Cetirizine]  CBC Latest Ref Rng & Units 12/31/2020 10/15/2020 03/02/2020  WBC 4.0 - 10.5 K/uL 4.4 4.6 3.7(L)  Hemoglobin 12.0 - 15.0 g/dL 12.2 13.8 12.4  Hematocrit 36.0 - 46.0 % 35.7(L) 41.5 36.4  Platelets 150 - 400 K/uL 162 153 132(L)      CMP     Component Value Date/Time   NA 136 12/31/2020 0102   NA 139 11/11/2017 1123   NA 131 (L) 10/15/2013 1123   K 3.5 12/31/2020 0102   K 3.6 10/15/2013 1123   CL 104 12/31/2020 0102   CL 102 10/15/2013 1123   CO2 20 (L) 12/31/2020 0102   CO2 24 10/15/2013 1123   GLUCOSE 88 12/31/2020 0102   GLUCOSE 111 (H) 10/15/2013 1123   BUN 12 12/31/2020 0102   BUN 25 11/11/2017 1123   BUN 12 10/15/2013 1123   CREATININE 0.66 12/31/2020 0102   CREATININE 1.20 10/15/2013 1123   CALCIUM 9.1 12/31/2020 0102   CALCIUM 9.1 10/15/2013 1123   PROT 6.6 12/31/2020 0102   PROT 7.9 11/11/2017 1123   ALBUMIN 3.6 12/31/2020 0102   ALBUMIN 4.9 (H) 11/11/2017 1123   AST 35 12/31/2020 0102   ALT 28 12/31/2020 0102   ALKPHOS 50 12/31/2020 0102   BILITOT 0.3 12/31/2020 0102   BILITOT 0.3 11/11/2017 1123   GFRNONAA >60 12/31/2020 0102   GFRNONAA 50 (L) 10/15/2013 1123   GFRAA >60 03/02/2020 1232   GFRAA 58 (L) 10/15/2013 1123     No results found.     Assessment & Plan:   1. Bilateral carotid artery  stenosis Recommend:  Given the patient's asymptomatic subcritical stenosis no further invasive testing or surgery at this time.  Duplex ultrasound shows 1-39% stenosis bilaterally.  Continue antiplatelet therapy as prescribed Continue management of CAD, HTN and Hyperlipidemia Healthy heart diet,  encouraged exercise at least 4 times per week Follow up in 12 months with duplex ultrasound and physical exam    2. Atherosclerosis of native artery of both lower extremities with intermittent claudication (HCC)  Recommend:  The patient has evidence of atherosclerosis of the lower extremities with claudication.  The patient does not voice lifestyle limiting changes at this point in time.  Noninvasive studies do not suggest clinically significant change.  No invasive studies, angiography or surgery at this time The patient should continue walking and begin a more formal exercise program.  The patient should continue antiplatelet therapy and aggressive treatment of the lipid abnormalities  No changes in the patient's medications at this time  The patient should continue wearing graduated compression socks 10-15 mmHg strength to control the mild edema.    3. Essential hypertension Continue antihypertensive medications as already ordered, these medications have been reviewed and there are no changes at this time.   4. Tobacco use disorder Smoking cessation was discussed, 3-10 minutes spent on this topic specifically   5. Mixed hyperlipidemia Continue statin as ordered and reviewed, no changes at this time   6. 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  We will continue to monitor with annual carotid studies   Current Outpatient Medications on File Prior to Visit  Medication Sig Dispense Refill  . albuterol (VENTOLIN HFA) 108 (90 Base) MCG/ACT inhaler Inhale 2 puffs into the lungs every 6 (six) hours as needed for wheezing or  shortness of breath. 18 g 3  . aspirin EC 81 MG EC tablet Take 1 tablet (81 mg total) by mouth daily. 90 tablet  1  . atorvastatin (LIPITOR) 80 MG tablet Take 1 tablet (80 mg total) by mouth daily. 90 tablet 1  . budesonide-formoterol (SYMBICORT) 80-4.5 MCG/ACT inhaler Inhale 2 puffs into the lungs daily. 3 each 1  . cholecalciferol (VITAMIN D) 400 units TABS tablet Take 400 Units by mouth.    . Evolocumab (REPATHA) 140 MG/ML SOSY Use as directed by cardiology 2.1 mL 0  . ezetimibe (ZETIA) 10 MG tablet Take 1 tablet (10 mg total) by mouth daily. 90 tablet 1  . ferrous sulfate 325 (65 FE) MG tablet Take 325 mg by mouth daily.    . isosorbide mononitrate (IMDUR) 30 MG 24 hr tablet Take 1 tablet (30 mg total) by mouth daily. 90 tablet 1  . losartan (COZAAR) 50 MG tablet Take 1 tablet (50 mg total) by mouth daily. 90 tablet 1   No current facility-administered medications on file prior to visit.    There are no Patient Instructions on file for this visit. No follow-ups on file.   Kris Hartmann, NP

## 2021-06-16 ENCOUNTER — Telehealth: Payer: Self-pay | Admitting: Internal Medicine

## 2021-06-16 DIAGNOSIS — I2510 Atherosclerotic heart disease of native coronary artery without angina pectoris: Secondary | ICD-10-CM | POA: Diagnosis not present

## 2021-06-16 NOTE — Chronic Care Management (AMB) (Signed)
  Chronic Care Management   Note  06/16/2021 Name: [name] MRN: [id number] DOB: [birth year]  [name] is a 65 y.o. year old female who is a primary care patient of Lavera Guise, MD. I reached out to [facility] by phone today in response to a referral sent by Ms. [place] PCP, Lavera Guise, MD.   [name] was given information about Chronic Care Management services today including:  CCM service includes personalized support from designated clinical staff supervised by her physician, including individualized plan of care and coordination with other care providers 24/7 contact phone numbers for assistance for urgent and routine care needs. Service will only be billed when office clinical staff spend 20 minutes or more in a month to coordinate care. Only one practitioner may furnish and bill the service in a calendar month. The patient may stop CCM services at any time (effective at the end of the month) by phone call to the office staff.   Patient agreed to services and verbal consent obtained.   Follow up plan:   Tatjana Secretary/administrator

## 2021-06-21 ENCOUNTER — Other Ambulatory Visit: Payer: Medicare Other

## 2021-06-23 DIAGNOSIS — I2510 Atherosclerotic heart disease of native coronary artery without angina pectoris: Secondary | ICD-10-CM | POA: Diagnosis not present

## 2021-06-23 DIAGNOSIS — F17210 Nicotine dependence, cigarettes, uncomplicated: Secondary | ICD-10-CM | POA: Diagnosis not present

## 2021-06-23 DIAGNOSIS — I340 Nonrheumatic mitral (valve) insufficiency: Secondary | ICD-10-CM | POA: Diagnosis not present

## 2021-06-23 DIAGNOSIS — I10 Essential (primary) hypertension: Secondary | ICD-10-CM | POA: Diagnosis not present

## 2021-06-23 DIAGNOSIS — I351 Nonrheumatic aortic (valve) insufficiency: Secondary | ICD-10-CM | POA: Diagnosis not present

## 2021-06-23 DIAGNOSIS — R0602 Shortness of breath: Secondary | ICD-10-CM | POA: Diagnosis not present

## 2021-06-23 DIAGNOSIS — I739 Peripheral vascular disease, unspecified: Secondary | ICD-10-CM | POA: Diagnosis not present

## 2021-07-01 ENCOUNTER — Inpatient Hospital Stay
Admission: EM | Admit: 2021-07-01 | Discharge: 2021-07-05 | DRG: 190 | Disposition: A | Discharge status: Home / Self Care | Payer: Medicare Other | Attending: Internal Medicine | Admitting: Internal Medicine

## 2021-07-01 ENCOUNTER — Emergency Department: Payer: Medicare Other

## 2021-07-01 ENCOUNTER — Other Ambulatory Visit: Payer: Self-pay

## 2021-07-01 ENCOUNTER — Encounter: Payer: Self-pay | Admitting: Emergency Medicine

## 2021-07-01 DIAGNOSIS — Z79899 Other long term (current) drug therapy: Secondary | ICD-10-CM | POA: Diagnosis not present

## 2021-07-01 DIAGNOSIS — I10 Essential (primary) hypertension: Secondary | ICD-10-CM | POA: Diagnosis not present

## 2021-07-01 DIAGNOSIS — I2583 Coronary atherosclerosis due to lipid rich plaque: Secondary | ICD-10-CM | POA: Diagnosis present

## 2021-07-01 DIAGNOSIS — F17210 Nicotine dependence, cigarettes, uncomplicated: Secondary | ICD-10-CM | POA: Diagnosis present

## 2021-07-01 DIAGNOSIS — J101 Influenza due to other identified influenza virus with other respiratory manifestations: Secondary | ICD-10-CM | POA: Diagnosis present

## 2021-07-01 DIAGNOSIS — I739 Peripheral vascular disease, unspecified: Secondary | ICD-10-CM | POA: Diagnosis not present

## 2021-07-01 DIAGNOSIS — E860 Dehydration: Secondary | ICD-10-CM | POA: Diagnosis not present

## 2021-07-01 DIAGNOSIS — J441 Chronic obstructive pulmonary disease with (acute) exacerbation: Secondary | ICD-10-CM | POA: Diagnosis not present

## 2021-07-01 DIAGNOSIS — J9601 Acute respiratory failure with hypoxia: Secondary | ICD-10-CM | POA: Diagnosis present

## 2021-07-01 DIAGNOSIS — D709 Neutropenia, unspecified: Secondary | ICD-10-CM | POA: Diagnosis not present

## 2021-07-01 DIAGNOSIS — R0902 Hypoxemia: Secondary | ICD-10-CM | POA: Diagnosis not present

## 2021-07-01 DIAGNOSIS — F17200 Nicotine dependence, unspecified, uncomplicated: Secondary | ICD-10-CM | POA: Diagnosis not present

## 2021-07-01 DIAGNOSIS — J439 Emphysema, unspecified: Secondary | ICD-10-CM | POA: Diagnosis not present

## 2021-07-01 DIAGNOSIS — Z743 Need for continuous supervision: Secondary | ICD-10-CM | POA: Diagnosis not present

## 2021-07-01 DIAGNOSIS — R112 Nausea with vomiting, unspecified: Secondary | ICD-10-CM | POA: Diagnosis not present

## 2021-07-01 DIAGNOSIS — R109 Unspecified abdominal pain: Secondary | ICD-10-CM | POA: Diagnosis not present

## 2021-07-01 DIAGNOSIS — J449 Chronic obstructive pulmonary disease, unspecified: Secondary | ICD-10-CM | POA: Diagnosis not present

## 2021-07-01 DIAGNOSIS — I6529 Occlusion and stenosis of unspecified carotid artery: Secondary | ICD-10-CM | POA: Diagnosis not present

## 2021-07-01 DIAGNOSIS — N179 Acute kidney failure, unspecified: Secondary | ICD-10-CM | POA: Diagnosis not present

## 2021-07-01 DIAGNOSIS — R7989 Other specified abnormal findings of blood chemistry: Secondary | ICD-10-CM

## 2021-07-01 DIAGNOSIS — Z8249 Family history of ischemic heart disease and other diseases of the circulatory system: Secondary | ICD-10-CM

## 2021-07-01 DIAGNOSIS — Z7982 Long term (current) use of aspirin: Secondary | ICD-10-CM

## 2021-07-01 DIAGNOSIS — Z20822 Contact with and (suspected) exposure to covid-19: Secondary | ICD-10-CM | POA: Diagnosis not present

## 2021-07-01 DIAGNOSIS — I252 Old myocardial infarction: Secondary | ICD-10-CM | POA: Diagnosis not present

## 2021-07-01 DIAGNOSIS — I959 Hypotension, unspecified: Secondary | ICD-10-CM | POA: Diagnosis present

## 2021-07-01 DIAGNOSIS — Z885 Allergy status to narcotic agent status: Secondary | ICD-10-CM | POA: Diagnosis not present

## 2021-07-01 DIAGNOSIS — R42 Dizziness and giddiness: Secondary | ICD-10-CM

## 2021-07-01 DIAGNOSIS — I2510 Atherosclerotic heart disease of native coronary artery without angina pectoris: Secondary | ICD-10-CM | POA: Diagnosis not present

## 2021-07-01 DIAGNOSIS — E782 Mixed hyperlipidemia: Secondary | ICD-10-CM | POA: Diagnosis present

## 2021-07-01 DIAGNOSIS — Z90710 Acquired absence of both cervix and uterus: Secondary | ICD-10-CM

## 2021-07-01 DIAGNOSIS — E559 Vitamin D deficiency, unspecified: Secondary | ICD-10-CM | POA: Diagnosis present

## 2021-07-01 DIAGNOSIS — E872 Acidosis, unspecified: Secondary | ICD-10-CM | POA: Diagnosis not present

## 2021-07-01 DIAGNOSIS — J111 Influenza due to unidentified influenza virus with other respiratory manifestations: Secondary | ICD-10-CM

## 2021-07-01 DIAGNOSIS — F1010 Alcohol abuse, uncomplicated: Secondary | ICD-10-CM | POA: Diagnosis present

## 2021-07-01 DIAGNOSIS — D6959 Other secondary thrombocytopenia: Secondary | ICD-10-CM | POA: Diagnosis present

## 2021-07-01 DIAGNOSIS — Z888 Allergy status to other drugs, medicaments and biological substances status: Secondary | ICD-10-CM

## 2021-07-01 DIAGNOSIS — R9431 Abnormal electrocardiogram [ECG] [EKG]: Secondary | ICD-10-CM | POA: Diagnosis not present

## 2021-07-01 LAB — RESP PANEL BY RT-PCR (FLU A&B, COVID) ARPGX2
Influenza A by PCR: POSITIVE — AB
Influenza B by PCR: NEGATIVE
SARS Coronavirus 2 by RT PCR: NEGATIVE

## 2021-07-01 LAB — CBC WITH DIFFERENTIAL/PLATELET
Abs Immature Granulocytes: 0.03 10*3/uL (ref 0.00–0.07)
Basophils Absolute: 0 10*3/uL (ref 0.0–0.1)
Basophils Relative: 0 %
Eosinophils Absolute: 0 10*3/uL (ref 0.0–0.5)
Eosinophils Relative: 0 %
HCT: 41.3 % (ref 36.0–46.0)
Hemoglobin: 14.5 g/dL (ref 12.0–15.0)
Immature Granulocytes: 1 %
Lymphocytes Relative: 14 %
Lymphs Abs: 0.7 10*3/uL (ref 0.7–4.0)
MCH: 34.6 pg — ABNORMAL HIGH (ref 26.0–34.0)
MCHC: 35.1 g/dL (ref 30.0–36.0)
MCV: 98.6 fL (ref 80.0–100.0)
Monocytes Absolute: 0.4 10*3/uL (ref 0.1–1.0)
Monocytes Relative: 7 %
Neutro Abs: 4.2 10*3/uL (ref 1.7–7.7)
Neutrophils Relative %: 78 %
Platelets: 117 10*3/uL — ABNORMAL LOW (ref 150–400)
RBC: 4.19 MIL/uL (ref 3.87–5.11)
RDW: 12.9 % (ref 11.5–15.5)
Smear Review: NORMAL
WBC: 5.4 10*3/uL (ref 4.0–10.5)
nRBC: 0 % (ref 0.0–0.2)

## 2021-07-01 LAB — COMPREHENSIVE METABOLIC PANEL
ALT: 21 U/L (ref 0–44)
AST: 56 U/L — ABNORMAL HIGH (ref 15–41)
Albumin: 3.5 g/dL (ref 3.5–5.0)
Alkaline Phosphatase: 46 U/L (ref 38–126)
Anion gap: 11 (ref 5–15)
BUN: 24 mg/dL — ABNORMAL HIGH (ref 8–23)
CO2: 23 mmol/L (ref 22–32)
Calcium: 8.8 mg/dL — ABNORMAL LOW (ref 8.9–10.3)
Chloride: 98 mmol/L (ref 98–111)
Creatinine, Ser: 1.6 mg/dL — ABNORMAL HIGH (ref 0.44–1.00)
GFR, Estimated: 36 mL/min — ABNORMAL LOW (ref >60)
Glucose, Bld: 99 mg/dL (ref 70–99)
Potassium: 3.7 mmol/L (ref 3.5–5.1)
Sodium: 132 mmol/L — ABNORMAL LOW (ref 135–145)
Total Bilirubin: 0.5 mg/dL (ref 0.3–1.2)
Total Protein: 7.1 g/dL (ref 6.5–8.1)

## 2021-07-01 LAB — PROCALCITONIN: Procalcitonin: 1.15 ng/mL

## 2021-07-01 LAB — CBC
HCT: 37.3 % (ref 36.0–46.0)
Hemoglobin: 12.4 g/dL (ref 12.0–15.0)
MCH: 32.5 pg (ref 26.0–34.0)
MCHC: 33.2 g/dL (ref 30.0–36.0)
MCV: 97.6 fL (ref 80.0–100.0)
Platelets: 99 10*3/uL — ABNORMAL LOW (ref 150–400)
RBC: 3.82 MIL/uL — ABNORMAL LOW (ref 3.87–5.11)
RDW: 12.9 % (ref 11.5–15.5)
WBC: 4.3 10*3/uL (ref 4.0–10.5)
nRBC: 0 % (ref 0.0–0.2)

## 2021-07-01 LAB — BRAIN NATRIURETIC PEPTIDE: B Natriuretic Peptide: 75.9 pg/mL (ref 0.0–100.0)

## 2021-07-01 LAB — TROPONIN I (HIGH SENSITIVITY)
Troponin I (High Sensitivity): 29 ng/L — ABNORMAL HIGH (ref <18)
Troponin I (High Sensitivity): 24 ng/L — ABNORMAL HIGH (ref <18)

## 2021-07-01 LAB — CREATININE, SERUM
Creatinine, Ser: 1.36 mg/dL — ABNORMAL HIGH (ref 0.44–1.00)
GFR, Estimated: 43 mL/min — ABNORMAL LOW (ref >60)

## 2021-07-01 LAB — LACTIC ACID, PLASMA
Lactic Acid, Venous: 2.6 mmol/L (ref 0.5–1.9)
Lactic Acid, Venous: 1.1 mmol/L (ref 0.5–1.9)

## 2021-07-01 MED ORDER — SODIUM CHLORIDE 0.9 % IV BOLUS
1000.0000 mL | Freq: Once | INTRAVENOUS | Status: AC
  Administered 2021-07-01: 1000 mL via INTRAVENOUS

## 2021-07-01 MED ORDER — MOMETASONE FURO-FORMOTEROL FUM 100-5 MCG/ACT IN AERO
2.0000 | INHALATION_SPRAY | Freq: Two times a day (BID) | RESPIRATORY_TRACT | Status: DC
Start: 2021-07-01 — End: 2021-07-05
  Administered 2021-07-01 – 2021-07-05 (×7): 2 via RESPIRATORY_TRACT
  Filled 2021-07-01: qty 8.8

## 2021-07-01 MED ORDER — EZETIMIBE 10 MG PO TABS
10.0000 mg | ORAL_TABLET | Freq: Every day | ORAL | Status: DC
  Administered 2021-07-02 – 2021-07-05 (×4): 10 mg via ORAL
  Filled 2021-07-01 (×4): qty 1

## 2021-07-01 MED ORDER — ONDANSETRON HCL 4 MG/2ML IJ SOLN: 4.0000 mg | Freq: Four times a day (QID) | INTRAMUSCULAR | Status: DC | PRN

## 2021-07-01 MED ORDER — ALBUTEROL SULFATE (2.5 MG/3ML) 0.083% IN NEBU: 2.5000 mg | INHALATION_SOLUTION | RESPIRATORY_TRACT | Status: DC | PRN

## 2021-07-01 MED ORDER — HEPARIN SODIUM (PORCINE) 5000 UNIT/ML IJ SOLN
5000.0000 [IU] | Freq: Three times a day (TID) | INTRAMUSCULAR | Status: DC
  Administered 2021-07-01 – 2021-07-02 (×3): 5000 [IU] via SUBCUTANEOUS
  Filled 2021-07-01 (×2): qty 1

## 2021-07-01 MED ORDER — LACTATED RINGERS IV SOLN: INTRAVENOUS | Status: DC

## 2021-07-01 MED ORDER — NICOTINE 21 MG/24HR TD PT24
21.0000 mg | MEDICATED_PATCH | Freq: Every day | TRANSDERMAL | Status: DC
  Administered 2021-07-01 – 2021-07-05 (×5): 21 mg via TRANSDERMAL
  Filled 2021-07-01 (×6): qty 1

## 2021-07-01 MED ORDER — ASPIRIN EC 81 MG PO TBEC
81.0000 mg | DELAYED_RELEASE_TABLET | Freq: Every day | ORAL | Status: DC
  Administered 2021-07-02 – 2021-07-05 (×4): 81 mg via ORAL
  Filled 2021-07-01 (×4): qty 1

## 2021-07-01 MED ORDER — LOSARTAN POTASSIUM 50 MG PO TABS
50.0000 mg | ORAL_TABLET | Freq: Every day | ORAL | Status: DC
  Administered 2021-07-02: 50 mg via ORAL
  Filled 2021-07-01: qty 1

## 2021-07-01 MED ORDER — ATORVASTATIN CALCIUM 80 MG PO TABS
80.0000 mg | ORAL_TABLET | Freq: Every day | ORAL | Status: DC
  Administered 2021-07-02 – 2021-07-05 (×4): 80 mg via ORAL
  Filled 2021-07-01 (×2): qty 1
  Filled 2021-07-01 (×3): qty 4
  Filled 2021-07-01 (×2): qty 1
  Filled 2021-07-01: qty 4

## 2021-07-01 MED ORDER — CHOLECALCIFEROL 10 MCG/ML (400 UNIT/ML) PO LIQD
400.0000 [IU] | Freq: Every day | ORAL | Status: DC
  Administered 2021-07-02 – 2021-07-05 (×4): 400 [IU] via ORAL
  Filled 2021-07-01 (×4): qty 1

## 2021-07-01 MED ORDER — FERROUS SULFATE 325 (65 FE) MG PO TABS
325.0000 mg | ORAL_TABLET | Freq: Every day | ORAL | Status: DC
  Administered 2021-07-02 – 2021-07-05 (×4): 325 mg via ORAL
  Filled 2021-07-01 (×3): qty 1

## 2021-07-01 MED ORDER — IPRATROPIUM-ALBUTEROL 0.5-2.5 (3) MG/3ML IN SOLN
3.0000 mL | Freq: Four times a day (QID) | RESPIRATORY_TRACT | Status: DC
  Administered 2021-07-01 – 2021-07-02 (×4): 3 mL via RESPIRATORY_TRACT
  Filled 2021-07-01 (×2): qty 3

## 2021-07-01 MED ORDER — ISOSORBIDE MONONITRATE ER 30 MG PO TB24
30.0000 mg | ORAL_TABLET | Freq: Every day | ORAL | Status: DC
  Administered 2021-07-02 – 2021-07-05 (×4): 30 mg via ORAL
  Filled 2021-07-01 (×4): qty 1

## 2021-07-01 NOTE — ED Triage Notes (Addendum)
Pt via POV from home. Pt c/o intermittent episodes of dizziness, pt states she also has been nauseous. Denies hx of vertigo. Denies pain. Pt has a hx of COPD. Denies SOB Pt is A&Ox4 and NAD.

## 2021-07-01 NOTE — Assessment & Plan Note (Addendum)
Lactic Acid is 2.6. Due to influenza. No evidencee for SIRS or sepsis. Will address with IV fluid resuscitation. Monitor.
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
## 2021-07-01 NOTE — Assessment & Plan Note (Signed)
The patient states that she has not been able to eat for a week. However, at this time she is complaining of hunger and wants a diet ordered. She actually is denying nausea and vomiting at this time, but states that this has prevented her from eating for a week. IV fluid resuscitation and zofran is available for nausea on an as needed basis.

## 2021-07-01 NOTE — Assessment & Plan Note (Signed)
Noted. Stable. Pt denies chest pain.Continue home dosages of ASA 81 mg daily, Zetia 10 mg daily, Imdur 30 mg daily, and Lipitor 20 mg daily.

## 2021-07-01 NOTE — Assessment & Plan Note (Signed)
Pt is on Dulera and Albuterol at home. She continues to smoke cigarettes at home, although she states that she is trying to cut down. Primarily emphysema comprising COPD. Will continue dulera and albuterol. She is in exacerbation due to Influenza A.

## 2021-07-01 NOTE — H&P (Signed)
[name] is an 65 y.o. female.   Chief Complaint: Dry cough, shortness of breath, nausea and diarrhea. HPI: The patient is a 65 yr old woman who presents from home. She states that she has been sick for a week, and that several members of her family have been sick. Her shortness of breath has been getting quite a bit worse. She states that she has not been able to eat for a week, although she is hungry now. She states that she has not vomited, but that she has had some diarrhea, although that has slowed down with her last loose BM this morning. She is not on O2 at home, but she has had increasing shortness of breath particularly with exertion and speech.  The patient has a past medical history significant for CAD (s/p MI), COPD, Hypertension, Hyperlipidemia, Nicotine addiction, alcohol abuse, carotid stenosis, and PVD.   In the ED she was found to have an O2 sat on room air in the high nineties per Dr. Sonny Dandy note. She is afebrile, RR of 20 and presenting blood pressure of 70/42 that has responded well to IV fluid resuscitation. She has tested negative for COVID, but positive for influenza A. She has a creatinine that is increased to 1.6 over her baseline of 0.66. Troponin is elevated at 29. EKG demonstrates NSR with a borderline prolonged QTc. Procalcitonin is 1.15. Lactic acid is 2.6.   Patient denies fevers, although she has had chills. She states that she has had a dry cough. No chest pain. She has had dizziness and light headedness. Her shortness of breath worsens with speech or activity. She has had nausea but no vomiting. She states that she has had nothing to eat for a week. She states that she has had diarrhea with last loose BM this morning. No rashes, no sores, no lesions. No joint swelling. No peripheral swelling. No neurological changes other than lightheadedness.   Past Medical History:  Diagnosis Date   Allergy    Environmental   Arthritis    Asthma    Carotid artery stenosis     Carotid stenosis    COPD (chronic obstructive pulmonary disease) (HCC)    Coronary artery disease    Coronary artery disease    Hemorrhoids    Hyperlipidemia    Hypertension    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(HCC)    negative cardiac cath   Nicotine addiction    Peripheral vascular disease (HCC)    Pneumonia    Shortness of breath dyspnea    Tobacco abuse     Past Surgical History:  Procedure Laterality Date   ABDOMINAL HYSTERECTOMY     APPENDECTOMY     CARDIAC CATHETERIZATION     CARDIAC SURGERY     CHOLECYSTECTOMY     COLONOSCOPY  12/15/11   OH->bleeding internal hemorrhoids, otherwise normal   COLONOSCOPY WITH PROPOFOL N/A 04/12/2015   Procedure: COLONOSCOPY WITH PROPOFOL;  Surgeon: Lucilla Lame, MD;  Location: [facility] ENDOSCOPY;  Service: Endoscopy;  Laterality: N/A;   COLONOSCOPY WITH PROPOFOL N/A 01/05/2020   Procedure: COLONOSCOPY WITH PROPOFOL;  Surgeon: Lucilla Lame, MD;  Location: [facility] ENDOSCOPY;  Service: Endoscopy;  Laterality: N/A;   ENDARTERECTOMY FEMORAL Left 04/26/2015   Procedure: ENDARTERECTOMY FEMORAL;  Surgeon: Algernon Huxley, MD;  Location: [facility] ORS;  Service: Vascular;  Laterality: Left;   ENDOVASCULAR STENT INSERTION Bilateral    Legs   FLEXIBLE BRONCHOSCOPY N/A 02/02/2015   Procedure: FLEXIBLE BRONCHOSCOPY;  Surgeon: Allyne Gee, MD;  Location: [facility] ORS;  Service: Pulmonary;  Laterality: N/A;   HEMORRHOID SURGERY     PERIPHERAL VASCULAR CATHETERIZATION Left 04/25/2015   Procedure: Lower Extremity Angiography;  Surgeon: Algernon Huxley, MD;  Location: [facility];  Service: Cardiovascular;  Laterality: Left;   PERIPHERAL VASCULAR CATHETERIZATION Left 04/25/2015   Procedure: Lower Extremity Intervention;  Surgeon: Algernon Huxley, MD;  Location: [facility];  Service: Cardiovascular;  Laterality: Left;   PERIPHERAL VASCULAR CATHETERIZATION Left 01/02/2016   Procedure: Lower Extremity Angiography;  Surgeon: Algernon Huxley, MD;  Location: [facility];  Service:  Cardiovascular;  Laterality: Left;   PERIPHERAL VASCULAR CATHETERIZATION  01/02/2016   Procedure: Lower Extremity Intervention;  Surgeon: Algernon Huxley, MD;  Location: [facility];  Service: Cardiovascular;;   THROMBECTOMY FEMORAL ARTERY Left 04/26/2015   Procedure: THROMBECTOMY FEMORAL ARTERY;  Surgeon: Algernon Huxley, MD;  Location: [facility];  Service: Vascular;  Laterality: Left;    Family History  Problem Relation Age of Onset   Hypertension Mother    Hypertension Father    Social History:  reports that she has been smoking cigarettes. She has a 41.00 pack-year smoking history. She has never used smokeless tobacco. She reports current alcohol use. She reports that she does not use drugs. (Not in a hospital admission)   Allergies:  Allergies  Allergen Reactions   Aspirin Nausea And Vomiting and Other (See Comments)    Pt states aspirin makes her cramp and have to use coated kind.   Tramadol    Zyrtec [Cetirizine]    Review of Systems - 12 systems have been reviewed with the patient. Negative except for those elements listed in the HPI above.   General appearance: alert, cooperative, fatigued, and mild distress Head: Normocephalic, without obvious abnormality, atraumatic Eyes: conjunctivae/corneas clear. PERRL, EOM's intact. Fundi benign. Throat: lips, mucosa, and tongue normal; teeth and gums normal Neck: no adenopathy, no carotid bruit, no JVD, supple, symmetrical, trachea midline, and thyroid not enlarged, symmetric, no tenderness/mass/nodules Resp:  Diminished breath sounds bilaterally. Positive for increased work of breathing with accessory muscle use and tachypnea. No wheezes, rales, or rhonchi. No tactile fremitus. Chest wall: no tenderness Cardio: regular rate and rhythm, S1, S2 normal, no murmur, click, rub or gallop GI: soft, non-tender; bowel sounds normal; no masses,  no organomegaly Extremities: extremities normal, atraumatic, no cyanosis or edema Pulses: 2+ and  symmetric Skin: Skin color, texture, turgor normal. No rashes or lesions Lymph nodes: Cervical, supraclavicular, and axillary nodes normal. Neurologic: Alert and oriented X 3, normal strength and tone. Normal symmetric reflexes. Normal coordination and gait Incision/Wound: None noted.  Results for orders placed or performed during the hospital encounter of 07/01/21 (from the past 48 hour(s))  Troponin I (High Sensitivity)     Status: Abnormal   Collection Time: 07/01/21  2:10 PM  Result Value Ref Range   Troponin I (High Sensitivity) 29 (H) <18 ng/L    Comment: (NOTE) Elevated high sensitivity troponin I (hsTnI) values and significant  changes across serial measurements may suggest ACS but many other  chronic and acute conditions are known to elevate hsTnI results.  Refer to the Links section for chest pain algorithms and additional  guidance. Performed at [facility], [address] [place], [place] [address]   Brain natriuretic peptide     Status: None   Collection Time: 07/01/21  2:10 PM  Result Value Ref Range   B Natriuretic Peptide 75.9 0.0 - 100.0 pg/mL  Comment: Performed at [facility], [address] [place], [place] [address]  CBC with Differential     Status: Abnormal   Collection Time: 07/01/21  2:10 PM  Result Value Ref Range   WBC 5.4 4.0 - 10.5 K/uL   RBC 4.19 3.87 - 5.11 MIL/uL   Hemoglobin 14.5 12.0 - 15.0 g/dL   HCT 41.3 36.0 - 46.0 %   MCV 98.6 80.0 - 100.0 fL   MCH 34.6 (H) 26.0 - 34.0 pg   MCHC 35.1 30.0 - 36.0 g/dL   RDW 12.9 11.5 - 15.5 %   Platelets 117 (L) 150 - 400 K/uL    Comment: Immature Platelet Fraction may be clinically indicated, consider ordering this additional test [id number]    nRBC 0.0 0.0 - 0.2 %   Neutrophils Relative % 78 %   Neutro Abs 4.2 1.7 - 7.7 K/uL   Lymphocytes Relative 14 %   Lymphs Abs 0.7 0.7 - 4.0 K/uL   Monocytes Relative 7 %   Monocytes Absolute 0.4 0.1 - 1.0 K/uL   Eosinophils Relative 0  %   Eosinophils Absolute 0.0 0.0 - 0.5 K/uL   Basophils Relative 0 %   Basophils Absolute 0.0 0.0 - 0.1 K/uL   WBC Morphology MORPHOLOGY UNREMARKABLE    RBC Morphology MORPHOLOGY UNREMARKABLE    Smear Review Normal platelet morphology    Immature Granulocytes 1 %   Abs Immature Granulocytes 0.03 0.00 - 0.07 K/uL    Comment: Performed at [facility], [address], [place], [place] [address]  Resp Panel by RT-PCR (Flu A&B, Covid) Nasopharyngeal Swab     Status: Abnormal   Collection Time: 07/01/21  2:24 PM   Specimen: Nasopharyngeal Swab; Nasopharyngeal(NP) swabs in vial transport medium  Result Value Ref Range   SARS Coronavirus 2 by RT PCR NEGATIVE NEGATIVE    Comment: (NOTE) SARS-CoV-2 target nucleic acids are NOT DETECTED.  The SARS-CoV-2 RNA is generally detectable in upper respiratory specimens during the acute phase of infection. The lowest concentration of SARS-CoV-2 viral copies this assay can detect is 138 copies/mL. A negative result does not preclude SARS-Cov-2 infection and should not be used as the sole basis for treatment or other patient management decisions. A negative result may occur with  improper specimen collection/handling, submission of specimen other than nasopharyngeal swab, presence of viral mutation(s) within the areas targeted by this assay, and inadequate number of viral copies(<138 copies/mL). A negative result must be combined with clinical observations, patient history, and epidemiological information. The expected result is Negative.  Fact Sheet for Patients:  EntrepreneurPulse.com.au  Fact Sheet for Healthcare Providers:  IncredibleEmployment.be  This test is no t yet approved or cleared by the [place] FDA and  has been authorized for detection and/or diagnosis of SARS-CoV-2 by FDA under an Emergency Use Authorization (EUA). This EUA will remain  in effect (meaning this test can be used) for  the duration of the COVID-19 declaration under Section 564(b)(1) of the Act, 21 U.S.C.section 360bbb-3(b)(1), unless the authorization is terminated  or revoked sooner.       Influenza A by PCR POSITIVE (A) NEGATIVE   Influenza B by PCR NEGATIVE NEGATIVE    Comment: (NOTE) The Xpert Xpress SARS-CoV-2/FLU/RSV plus assay is intended as an aid in the diagnosis of influenza from Nasopharyngeal swab specimens and should not be used as a sole basis for treatment. Nasal washings and aspirates are unacceptable for Xpert Xpress SARS-CoV-2/FLU/RSV testing.  Fact Sheet for Patients: EntrepreneurPulse.com.au  Fact Sheet for Healthcare Providers: IncredibleEmployment.be  This test is not yet approved or cleared by the [place] FDA and has been authorized for detection and/or diagnosis of SARS-CoV-2 by FDA under an Emergency Use Authorization (EUA). This EUA will remain in effect (meaning this test can be used) for the duration of the COVID-19 declaration under Section 564(b)(1) of the Act, 21 U.S.C. section 360bbb-3(b)(1), unless the authorization is terminated or revoked.  Performed at [facility], [address] [place], [place] [address]   Lactic acid, plasma     Status: Abnormal   Collection Time: 07/01/21  2:25 PM  Result Value Ref Range   Lactic Acid, Venous 2.6 (HH) 0.5 - 1.9 mmol/L    Comment: CRITICAL RESULT CALLED TO, READ BACK BY AND VERIFIED WITH BRANDON GROGG 07/01/21 @ 1500 BY SB Performed at [facility], [address] [place], [place] [address]   Comprehensive metabolic panel     Status: Abnormal   Collection Time: 07/01/21  3:11 PM  Result Value Ref Range   Sodium 132 (L) 135 - 145 mmol/L   Potassium 3.7 3.5 - 5.1 mmol/L   Chloride 98 98 - 111 mmol/L   CO2 23 22 - 32 mmol/L   Glucose, Bld 99 70 - 99 mg/dL    Comment: Glucose reference range applies only to samples taken after fasting for at least 8  hours.   BUN 24 (H) 8 - 23 mg/dL   Creatinine, Ser 1.60 (H) 0.44 - 1.00 mg/dL   Calcium 8.8 (L) 8.9 - 10.3 mg/dL   Total Protein 7.1 6.5 - 8.1 g/dL   Albumin 3.5 3.5 - 5.0 g/dL   AST 56 (H) 15 - 41 U/L   ALT 21 0 - 44 U/L   Alkaline Phosphatase 46 38 - 126 U/L   Total Bilirubin 0.5 0.3 - 1.2 mg/dL   GFR, Estimated 36 (L) >60 mL/min    Comment: (NOTE) Calculated using the CKD-EPI Creatinine Equation (2021)    Anion gap 11 5 - 15    Comment: Performed at [facility], [address] [place], [place] [address]  Procalcitonin - Baseline     Status: None   Collection Time: 07/01/21  3:11 PM  Result Value Ref Range   Procalcitonin 1.15 ng/mL    Comment:        Interpretation: PCT > 0.5 ng/mL and <= 2 ng/mL: Systemic infection (sepsis) is possible, but other conditions are known to elevate PCT as well. (NOTE)       Sepsis PCT Algorithm           Lower Respiratory Tract                                      Infection PCT Algorithm    ----------------------------     ----------------------------         PCT < 0.25 ng/mL                PCT < 0.10 ng/mL          Strongly encourage             Strongly discourage   discontinuation of antibiotics    initiation of antibiotics    ----------------------------     -----------------------------       PCT 0.25 - 0.50 ng/mL            PCT  0.10 - 0.25 ng/mL               OR       >80% decrease in PCT            Discourage initiation of                                            antibiotics      Encourage discontinuation           of antibiotics    ----------------------------     -----------------------------         PCT >= 0.50 ng/mL              PCT 0.26 - 0.50 ng/mL                AND       <80% decrease in PCT             Encourage initiation of                                             antibiotics       Encourage continuation           of antibiotics    ----------------------------     -----------------------------         PCT >= 0.50 ng/mL                  PCT > 0.50 ng/mL               AND         increase in PCT                  Strongly encourage                                      initiation of antibiotics    Strongly encourage escalation           of antibiotics                                     -----------------------------                                           PCT <= 0.25 ng/mL                                                 OR                                        > 80% decrease in PCT  Discontinue / Do not initiate                                             antibiotics  Performed at [facility], [address] [place], [place] [address]   Lactic acid, plasma     Status: None   Collection Time: 07/01/21  4:10 PM  Result Value Ref Range   Lactic Acid, Venous 1.1 0.5 - 1.9 mmol/L    Comment: Performed at [facility], [place], [place] [address]  Troponin I (High Sensitivity)     Status: Abnormal   Collection Time: 07/01/21  4:10 PM  Result Value Ref Range   Troponin I (High Sensitivity) 24 (H) <18 ng/L    Comment: (NOTE) Elevated high sensitivity troponin I (hsTnI) values and significant  changes across serial measurements may suggest ACS but many other  chronic and acute conditions are known to elevate hsTnI results.  Refer to the "Links" section for chest pain algorithms and additional  guidance. Performed at [facility], [address] [place], [place] [address]   CBC     Status: Abnormal   Collection Time: 07/01/21  4:10 PM  Result Value Ref Range   WBC 4.3 4.0 - 10.5 K/uL   RBC 3.82 (L) 3.87 - 5.11 MIL/uL   Hemoglobin 12.4 12.0 - 15.0 g/dL   HCT 37.3 36.0 - 46.0 %   MCV 97.6 80.0 - 100.0 fL   MCH 32.5 26.0 - 34.0 pg   MCHC 33.2 30.0 - 36.0 g/dL   RDW 12.9 11.5 - 15.5 %   Platelets 99 (L) 150 - 400 K/uL    Comment: Immature Platelet Fraction may be clinically indicated,  consider ordering this additional test [id number]    nRBC 0.0 0.0 - 0.2 %    Comment: Performed at [facility], [address] [place], [place] [address]  Creatinine, serum     Status: Abnormal   Collection Time: 07/01/21  4:10 PM  Result Value Ref Range   Creatinine, Ser 1.36 (H) 0.44 - 1.00 mg/dL   GFR, Estimated 43 (L) >60 mL/min    Comment: (NOTE) Calculated using the CKD-EPI Creatinine Equation (2021) Performed at [facility], [address] [place], [place] [address]    @[email or web address] @  Blood pressure 128/72, pulse 67, temperature 98.8 F (37.1 C), temperature source Oral, resp. rate (!) 22, height 5\' 4"  (1.626 m), weight 51.3 kg, SpO2 95 %.    Assessment/Plan Acute respiratory failure with hypoxia [facility]) ED physician has documented SaO2 in the high eighties on room air. The patient was then placed on 2L O2 by nasal cannula. Wean as possible.  AKI (acute kidney injury) ([place]) Baseline creatinine is 0.66. It was 1.66 when pt presented to [facility] ED. She will be given IV fluids. Monitor creatinine, electrolytes, and volume status. Avoid nephrotoxic agents and hypotension.  Lactic acidosis Lactic Acid is 2.6. Due to influenza. No evidencee for SIRS or sepsis. Will address with IV fluid resuscitation. Monitor.  Acute exacerbation of chronic obstructive pulmonary disease (COPD) (HCC) Pt is on Dulera and Albuterol at home. She continues to smoke cigarettes at home, although she states that she is trying to cut down. Primarily emphysema comprising COPD. Will continue dulera and albuterol. She is in exacerbation due to Influenza A.  Alcohol abuse Patient states that she does  not recall when her last drink was. She states that she doesn't drink as much as she once did. Monitor for signs of withdrawal.  Coronary artery disease due to lipid rich plaque Noted. Stable. Pt denies chest pain.Continue home dosages of ASA 81 mg daily, Zetia 10 mg daily, Imdur 30 mg daily,  and Lipitor 20 mg daily.   Essential hypertension Hold home dosage of losartan at this point due to hypotension, low normal BP at this point. Monitor and restart as warranted.  Influenza A with respiratory manifestations Patient states that she has had symptoms for a week placing her well outside the window for antiviral therapy. Will provide supportive care and monitor for worsening respiratory status.   Nausea and vomiting The patient states that she has not been able to eat for a week. However, at this time she is complaining of hunger and wants a diet ordered. She actually is denying nausea and vomiting at this time, but states that this has prevented her from eating for a week. IV fluid resuscitation and zofran is available for nausea on an as needed basis.  I have seen and examined this patient myself. I have spent 68 minutes in her evaluation and care.  Brandi Tomlinson 07/01/2021, 6:16 PM

## 2021-07-01 NOTE — ED Triage Notes (Signed)
Pt comes ems from home with n/v for a few days. Family members have same illness. Hx of copd. Ambulatory to stretcher. 119/72, CBG 108, HR 92, 92% RA.

## 2021-07-01 NOTE — Assessment & Plan Note (Signed)
Patient states that she does not recall when her last drink was. She states that she doesn't drink as much as she once did. Monitor for signs of withdrawal.

## 2021-07-01 NOTE — Assessment & Plan Note (Signed)
ED physician has documented SaO2 in the high eighties on room air. The patient was then placed on 2L O2 by nasal cannula. Wean as possible.

## 2021-07-01 NOTE — Assessment & Plan Note (Signed)
Baseline creatinine is 0.66. It was 1.66 when pt presented to [facility] ED. She will be given IV fluids. Monitor creatinine, electrolytes, and volume status. Avoid nephrotoxic agents and hypotension.

## 2021-07-01 NOTE — Assessment & Plan Note (Signed)
Hold home dosage of losartan at this point due to hypotension, low normal BP at this point. Monitor and restart as warranted.

## 2021-07-01 NOTE — Assessment & Plan Note (Signed)
Patient states that she has had symptoms for a week placing her well outside the window for antiviral therapy. Will provide supportive care and monitor for worsening respiratory status.

## 2021-07-02 LAB — HIV ANTIBODY (ROUTINE TESTING W REFLEX): HIV Screen 4th Generation wRfx: NONREACTIVE

## 2021-07-02 MED ORDER — HEPARIN SODIUM (PORCINE) 5000 UNIT/ML IJ SOLN
5000.0000 [IU] | Freq: Three times a day (TID) | INTRAMUSCULAR | Status: DC
  Administered 2021-07-03: 5000 [IU] via SUBCUTANEOUS
  Filled 2021-07-02 (×2): qty 1

## 2021-07-02 MED ORDER — ADULT MULTIVITAMIN W/MINERALS CH
1.0000 | ORAL_TABLET | Freq: Every day | ORAL | Status: DC
  Administered 2021-07-02 – 2021-07-05 (×4): 1 via ORAL
  Filled 2021-07-02 (×4): qty 1

## 2021-07-02 MED ORDER — THIAMINE HCL 100 MG/ML IJ SOLN
100.0000 mg | Freq: Every day | INTRAMUSCULAR | Status: DC
  Filled 2021-07-02: qty 2

## 2021-07-02 MED ORDER — IPRATROPIUM-ALBUTEROL 0.5-2.5 (3) MG/3ML IN SOLN
3.0000 mL | Freq: Three times a day (TID) | RESPIRATORY_TRACT | Status: DC
  Administered 2021-07-03 – 2021-07-05 (×8): 3 mL via RESPIRATORY_TRACT
  Filled 2021-07-02 (×8): qty 3

## 2021-07-02 MED ORDER — THIAMINE HCL 100 MG PO TABS
100.0000 mg | ORAL_TABLET | Freq: Every day | ORAL | Status: DC
  Administered 2021-07-02 – 2021-07-05 (×4): 100 mg via ORAL
  Filled 2021-07-02 (×4): qty 1

## 2021-07-02 MED ORDER — AMLODIPINE BESYLATE 5 MG PO TABS: 5.0000 mg | ORAL_TABLET | Freq: Every day | ORAL | Status: DC

## 2021-07-02 MED ORDER — FOLIC ACID 1 MG PO TABS
1.0000 mg | ORAL_TABLET | Freq: Every day | ORAL | Status: DC
  Administered 2021-07-02 – 2021-07-05 (×4): 1 mg via ORAL
  Filled 2021-07-02 (×3): qty 1

## 2021-07-02 NOTE — Progress Notes (Signed)
PROGRESS NOTE    [name]  BZJ:[id number] DOB: Mar 07, 1956 DOA: 07/01/2021 PCP: Lavera Guise, MD    Brief Narrative:  The patient is a 65 yr old woman who presents from home. She states that she has been sick for a week, and that several members of her family have been sick. Her shortness of breath has been getting quite a bit worse. She states that she has not been able to eat for a week  Influenza + Covid negative   Consultants:    Procedures:   Antimicrobials:      Subjective: Feels about the same. No cp, no n/v  Objective: Vitals:   07/02/21 0530 07/02/21 0545 07/02/21 0700 07/02/21 0800  BP: (!) 153/74 (!) 151/79 (!) 155/81 (!) 166/85  Pulse: 69 65 71 70  Resp: (!) 24 17 (!) 21 (!) 21  Temp:      TempSrc:      SpO2: 97% 96% 97% 98%  Weight:      Height:        Intake/Output Summary (Last 24 hours) at 07/02/2021 0850 Last data filed at 07/01/2021 1807 Gross per 24 hour  Intake 1000 ml  Output --  Net 1000 ml   Filed Weights   07/01/21 1353  Weight: 51.3 kg    Examination:  General exam: Appears calm and comfortable  Respiratory system: increase exp time, decrease bs, no wheezing Cardiovascular system: S1 & S2 heard, RRR. No JVD, murmurs, rubs, gallops or clicks. Gastrointestinal system: Abdomen is nondistended, soft and nontender. Normal bowel sounds heard. Central nervous system: Alert and oriented.grossly intact Extremities: no edema Psychiatry: Judgement and insight appear normal. Mood & affect appropriate.     Data Reviewed: I have personally reviewed following labs and imaging studies  CBC: Recent Labs  Lab 07/01/21 1410 07/01/21 1610  WBC 5.4 4.3  NEUTROABS 4.2  --   HGB 14.5 12.4  HCT 41.3 37.3  MCV 98.6 97.6  PLT 117* 99*   Basic Metabolic Panel: Recent Labs  Lab 07/01/21 1511 07/01/21 1610  NA 132*  --   K 3.7  --   CL 98  --   CO2 23  --   GLUCOSE 99  --   BUN 24*  --   CREATININE 1.60* 1.36*  CALCIUM 8.8*   --    GFR: Estimated Creatinine Clearance: 33.4 mL/min (A) (by C-G formula based on SCr of 1.36 mg/dL (H)). Liver Function Tests: Recent Labs  Lab 07/01/21 1511  AST 56*  ALT 21  ALKPHOS 46  BILITOT 0.5  PROT 7.1  ALBUMIN 3.5   No results for input(s): LIPASE, AMYLASE in the last 168 hours. No results for input(s): AMMONIA in the last 168 hours. Coagulation Profile: No results for input(s): INR, PROTIME in the last 168 hours. Cardiac Enzymes: No results for input(s): CKTOTAL, CKMB, CKMBINDEX, TROPONINI in the last 168 hours. BNP (last 3 results) No results for input(s): PROBNP in the last 8760 hours. HbA1C: No results for input(s): HGBA1C in the last 72 hours. CBG: No results for input(s): GLUCAP in the last 168 hours. Lipid Profile: No results for input(s): CHOL, HDL, LDLCALC, TRIG, CHOLHDL, LDLDIRECT in the last 72 hours. Thyroid Function Tests: No results for input(s): TSH, T4TOTAL, FREET4, T3FREE, THYROIDAB in the last 72 hours. Anemia Panel: No results for input(s): VITAMINB12, FOLATE, FERRITIN, TIBC, IRON, RETICCTPCT in the last 72 hours. Sepsis Labs: Recent Labs  Lab 07/01/21 1425 07/01/21 1511 07/01/21 1610  PROCALCITON  --  1.15  --   LATICACIDVEN 2.6*  --  1.1    Recent Results (from the past 240 hour(s))  Resp Panel by RT-PCR (Flu A&B, Covid) Nasopharyngeal Swab     Status: Abnormal   Collection Time: 07/01/21  2:24 PM   Specimen: Nasopharyngeal Swab; Nasopharyngeal(NP) swabs in vial transport medium  Result Value Ref Range Status   SARS Coronavirus 2 by RT PCR NEGATIVE NEGATIVE Final    Comment: (NOTE) SARS-CoV-2 target nucleic acids are NOT DETECTED.  The SARS-CoV-2 RNA is generally detectable in upper respiratory specimens during the acute phase of infection. The lowest concentration of SARS-CoV-2 viral copies this assay can detect is 138 copies/mL. A negative result does not preclude SARS-Cov-2 infection and should not be used as the sole basis  for treatment or other patient management decisions. A negative result may occur with  improper specimen collection/handling, submission of specimen other than nasopharyngeal swab, presence of viral mutation(s) within the areas targeted by this assay, and inadequate number of viral copies(<138 copies/mL). A negative result must be combined with clinical observations, patient history, and epidemiological information. The expected result is Negative.  Fact Sheet for Patients:  EntrepreneurPulse.com.au  Fact Sheet for Healthcare Providers:  IncredibleEmployment.be  This test is no t yet approved or cleared by the [place] FDA and  has been authorized for detection and/or diagnosis of SARS-CoV-2 by FDA under an Emergency Use Authorization (EUA). This EUA will remain  in effect (meaning this test can be used) for the duration of the COVID-19 declaration under Section 564(b)(1) of the Act, 21 U.S.C.section 360bbb-3(b)(1), unless the authorization is terminated  or revoked sooner.       Influenza A by PCR POSITIVE (A) NEGATIVE Final   Influenza B by PCR NEGATIVE NEGATIVE Final    Comment: (NOTE) The Xpert Xpress SARS-CoV-2/FLU/RSV plus assay is intended as an aid in the diagnosis of influenza from Nasopharyngeal swab specimens and should not be used as a sole basis for treatment. Nasal washings and aspirates are unacceptable for Xpert Xpress SARS-CoV-2/FLU/RSV testing.  Fact Sheet for Patients: EntrepreneurPulse.com.au  Fact Sheet for Healthcare Providers: IncredibleEmployment.be  This test is not yet approved or cleared by the [place] FDA and has been authorized for detection and/or diagnosis of SARS-CoV-2 by FDA under an Emergency Use Authorization (EUA). This EUA will remain in effect (meaning this test can be used) for the duration of the COVID-19 declaration under Section 564(b)(1) of the Act,  21 U.S.C. section 360bbb-3(b)(1), unless the authorization is terminated or revoked.  Performed at [facility], [address] [place], [place] [address]          Radiology Studies: DG Chest Portable 1 View  Result Date: 07/01/2021 CLINICAL DATA:  Cough, dizziness EXAM: PORTABLE CHEST 1 VIEW COMPARISON:  Previous studies including the examination of 10/29/2018 FINDINGS: Transverse diameter of heart is slightly increased. Thoracic aorta is tortuous. Low position of diaphragms suggests COPD. 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 There are no signs of alveolar pulmonary edema or new focal infiltrates. There is no pleural effusion or pneumothorax. IMPRESSION: COPD.  There are no new infiltrates or signs of pulmonary edema. Electronically Signed   By: Elmer Picker M.D.   On: 07/01/2021 15:03        Scheduled Meds:  aspirin EC  81 mg Oral Daily   atorvastatin  80 mg Oral Daily   cholecalciferol  400 Units Oral Daily  ezetimibe  10 mg Oral Daily   ferrous sulfate  325 mg Oral Q breakfast   heparin  5,000 Units Subcutaneous Q8H   ipratropium-albuterol  3 mL Nebulization Q6H   isosorbide mononitrate  30 mg Oral Daily   losartan  50 mg Oral Daily   mometasone-formoterol  2 puff Inhalation BID   nicotine  21 mg Transdermal Daily   Continuous Infusions:  lactated ringers 100 mL/hr at 07/02/21 0600    Assessment & Plan:   Active Problems:   Essential hypertension   Tobacco use disorder   Mixed hyperlipidemia   Acute exacerbation of chronic obstructive pulmonary disease (COPD) (HCC)   Alcohol abuse   Coronary artery disease due to lipid rich plaque   Acute respiratory failure with hypoxia (HCC)   Lactic acidosis   AKI (acute kidney injury) ([place])   Influenza A with respiratory manifestations   Nausea and vomiting   Acute respiratory failure with hypoxia [facility]) ED physician has documented SaO2 in the high eighties on  room air.  10/30 clinically slowly improving.  On 2 L sats above 92%.  Asked nursing to start weaning down O2 Likely due to influenza Droplet precaution    AKI (acute kidney injury) ([place]) Likely prerenal. Improving with IV fluids, will continue    Lactic acidosis Likely due to influenza/dehydration Improved with IV fluids   Acute exacerbation of chronic obstructive pulmonary disease (COPD) (HCC) 2/2 influenza Continue nebs, inhalers No wheezing on exam.  02 sat >92%, will wean down.    Alcohol abuse Will place on ciwa protocal   Coronary artery disease due to lipid rich plaque Asx, no cp Continue aspirin, Zetia, statin, Imdur     Essential hypertension Stable continue to monitor Holding ARB due to AKI   Influenza A with respiratory manifestations Patient states that she has had symptoms for a week placing her well outside the window for antiviral therapy. Continue supportive therapy   nausea and vomiting Resolved   DVT prophylaxis: heparin Code Status:full Family Communication: son at bedtime Disposition Plan: home Status is: Inpatient  Remains inpatient appropriate because: AKI, DOE, on 02, clinically not stable, severity of illness requiring hospitalization.            LOS: 1 day   Time spent: 35 minutes with more than 50% on [place], MD [facility] Pager [phone]  If 7PM-7AM, please contact night-coverage 07/02/2021, 8:50 AM

## 2021-07-02 NOTE — ED Notes (Signed)
Pt assisted to toilet & back to bed. Pt re-connected to cardiac, BP, pulse ox monitors. Pt now resting comfortably in bed, NAD. No additional needs verbalized at this time. Bed low & locked; call light & personal items within reach.

## 2021-07-03 LAB — BASIC METABOLIC PANEL
Anion gap: 9 (ref 5–15)
BUN: 9 mg/dL (ref 8–23)
CO2: 26 mmol/L (ref 22–32)
Calcium: 8.9 mg/dL (ref 8.9–10.3)
Chloride: 102 mmol/L (ref 98–111)
Creatinine, Ser: 0.67 mg/dL (ref 0.44–1.00)
GFR, Estimated: >60 mL/min (ref >60)
Glucose, Bld: 84 mg/dL (ref 70–99)
Potassium: 3.6 mmol/L (ref 3.5–5.1)
Sodium: 137 mmol/L (ref 135–145)

## 2021-07-03 LAB — CBC
HCT: 34.1 % — ABNORMAL LOW (ref 36.0–46.0)
Hemoglobin: 12 g/dL (ref 12.0–15.0)
MCH: 34.3 pg — ABNORMAL HIGH (ref 26.0–34.0)
MCHC: 35.2 g/dL (ref 30.0–36.0)
MCV: 97.4 fL (ref 80.0–100.0)
Platelets: 96 10*3/uL — ABNORMAL LOW (ref 150–400)
RBC: 3.5 MIL/uL — ABNORMAL LOW (ref 3.87–5.11)
RDW: 12.4 % (ref 11.5–15.5)
WBC: 2.9 10*3/uL — ABNORMAL LOW (ref 4.0–10.5)
nRBC: 0 % (ref 0.0–0.2)

## 2021-07-03 MED ORDER — METHYLPREDNISOLONE SODIUM SUCC 40 MG IJ SOLR
40.0000 mg | Freq: Once | INTRAMUSCULAR | Status: AC
  Administered 2021-07-03: 40 mg via INTRAVENOUS
  Filled 2021-07-03: qty 1

## 2021-07-03 MED ORDER — PANTOPRAZOLE SODIUM 20 MG PO TBEC
20.0000 mg | DELAYED_RELEASE_TABLET | Freq: Every day | ORAL | Status: DC
  Administered 2021-07-03 – 2021-07-05 (×3): 20 mg via ORAL
  Filled 2021-07-03 (×3): qty 1

## 2021-07-03 MED ORDER — GUAIFENESIN-DM 100-10 MG/5ML PO SYRP
15.0000 mL | ORAL_SOLUTION | ORAL | Status: DC | PRN
  Administered 2021-07-04 – 2021-07-05 (×4): 15 mL via ORAL
  Filled 2021-07-03 (×4): qty 15

## 2021-07-03 MED ORDER — PREDNISONE 20 MG PO TABS
40.0000 mg | ORAL_TABLET | Freq: Every day | ORAL | Status: DC
  Administered 2021-07-04 – 2021-07-05 (×2): 40 mg via ORAL
  Filled 2021-07-03 (×2): qty 2

## 2021-07-03 NOTE — Progress Notes (Signed)
PROGRESS NOTE    [name]  HOZ:[id number] DOB: December 15, 1955 DOA: 07/01/2021 PCP: Lavera Guise, MD    Brief Narrative:  The patient is a 65 yr old woman who presents from home. She states that she has been sick for a week, and that several members of her family have been sick. Her shortness of breath has been getting quite a bit worse. She states that she has not been able to eat for a week  Influenza + Covid negative  10/31 doesn't feel good, sob still. No new complaints.    Consultants:    Procedures:   Antimicrobials:      Subjective: No dizziness, n/v  Objective: Vitals:   07/03/21 0548 07/03/21 0750 07/03/21 0823 07/03/21 1322  BP: 97/68  108/78   Pulse: 77 73 77 78  Resp: 16 18 18 20   Temp: 98.7 F (37.1 C)  98.6 F (37 C)   TempSrc: Oral     SpO2: 93% 99% 91% 93%  Weight:      Height:        Intake/Output Summary (Last 24 hours) at 07/03/2021 1434 Last data filed at 07/03/2021 0556 Gross per 24 hour  Intake 600 ml  Output 1000 ml  Net -400 ml   Filed Weights   07/01/21 1353  Weight: 51.3 kg    Examination: Calm, sounds congested Mild expiratory wheezing Regular S1-S2 no gallops Soft benign positive bowel sounds No edema Awake oriented x3   Data Reviewed: I have personally reviewed following labs and imaging studies  CBC: Recent Labs  Lab 07/01/21 1410 07/01/21 1610 07/03/21 0500  WBC 5.4 4.3 2.9*  NEUTROABS 4.2  --   --   HGB 14.5 12.4 12.0  HCT 41.3 37.3 34.1*  MCV 98.6 97.6 97.4  PLT 117* 99* 96*   Basic Metabolic Panel: Recent Labs  Lab 07/01/21 1511 07/01/21 1610 07/03/21 0500  NA 132*  --  137  K 3.7  --  3.6  CL 98  --  102  CO2 23  --  26  GLUCOSE 99  --  84  BUN 24*  --  9  CREATININE 1.60* 1.36* 0.67  CALCIUM 8.8*  --  8.9   GFR: Estimated Creatinine Clearance: 56.8 mL/min (by C-G formula based on SCr of 0.67 mg/dL). Liver Function Tests: Recent Labs  Lab 07/01/21 1511  AST 56*  ALT 21  ALKPHOS  46  BILITOT 0.5  PROT 7.1  ALBUMIN 3.5   No results for input(s): LIPASE, AMYLASE in the last 168 hours. No results for input(s): AMMONIA in the last 168 hours. Coagulation Profile: No results for input(s): INR, PROTIME in the last 168 hours. Cardiac Enzymes: No results for input(s): CKTOTAL, CKMB, CKMBINDEX, TROPONINI in the last 168 hours. BNP (last 3 results) No results for input(s): PROBNP in the last 8760 hours. HbA1C: No results for input(s): HGBA1C in the last 72 hours. CBG: No results for input(s): GLUCAP in the last 168 hours. Lipid Profile: No results for input(s): CHOL, HDL, LDLCALC, TRIG, CHOLHDL, LDLDIRECT in the last 72 hours. Thyroid Function Tests: No results for input(s): TSH, T4TOTAL, FREET4, T3FREE, THYROIDAB in the last 72 hours. Anemia Panel: No results for input(s): VITAMINB12, FOLATE, FERRITIN, TIBC, IRON, RETICCTPCT in the last 72 hours. Sepsis Labs: Recent Labs  Lab 07/01/21 1425 07/01/21 1511 07/01/21 1610  PROCALCITON  --  1.15  --   LATICACIDVEN 2.6*  --  1.1    Recent Results (from the past 240 hour(s))  Resp Panel by RT-PCR (Flu A&B, Covid) Nasopharyngeal Swab     Status: Abnormal   Collection Time: 07/01/21  2:24 PM   Specimen: Nasopharyngeal Swab; Nasopharyngeal(NP) swabs in vial transport medium  Result Value Ref Range Status   SARS Coronavirus 2 by RT PCR NEGATIVE NEGATIVE Final    Comment: (NOTE) SARS-CoV-2 target nucleic acids are NOT DETECTED.  The SARS-CoV-2 RNA is generally detectable in upper respiratory specimens during the acute phase of infection. The lowest concentration of SARS-CoV-2 viral copies this assay can detect is 138 copies/mL. A negative result does not preclude SARS-Cov-2 infection and should not be used as the sole basis for treatment or other patient management decisions. A negative result may occur with  improper specimen collection/handling, submission of specimen other than nasopharyngeal swab, presence of  viral mutation(s) within the areas targeted by this assay, and inadequate number of viral copies(<138 copies/mL). A negative result must be combined with clinical observations, patient history, and epidemiological information. The expected result is Negative.  Fact Sheet for Patients:  EntrepreneurPulse.com.au  Fact Sheet for Healthcare Providers:  IncredibleEmployment.be  This test is no t yet approved or cleared by the [place] FDA and  has been authorized for detection and/or diagnosis of SARS-CoV-2 by FDA under an Emergency Use Authorization (EUA). This EUA will remain  in effect (meaning this test can be used) for the duration of the COVID-19 declaration under Section 564(b)(1) of the Act, 21 U.S.C.section 360bbb-3(b)(1), unless the authorization is terminated  or revoked sooner.       Influenza A by PCR POSITIVE (A) NEGATIVE Final   Influenza B by PCR NEGATIVE NEGATIVE Final    Comment: (NOTE) The Xpert Xpress SARS-CoV-2/FLU/RSV plus assay is intended as an aid in the diagnosis of influenza from Nasopharyngeal swab specimens and should not be used as a sole basis for treatment. Nasal washings and aspirates are unacceptable for Xpert Xpress SARS-CoV-2/FLU/RSV testing.  Fact Sheet for Patients: EntrepreneurPulse.com.au  Fact Sheet for Healthcare Providers: IncredibleEmployment.be  This test is not yet approved or cleared by the [place] FDA and has been authorized for detection and/or diagnosis of SARS-CoV-2 by FDA under an Emergency Use Authorization (EUA). This EUA will remain in effect (meaning this test can be used) for the duration of the COVID-19 declaration under Section 564(b)(1) of the Act, 21 U.S.C. section 360bbb-3(b)(1), unless the authorization is terminated or revoked.  Performed at [facility], [address] [place], [place] [address]          Radiology  Studies: DG Chest Portable 1 View  Result Date: 07/01/2021 CLINICAL DATA:  Cough, dizziness EXAM: PORTABLE CHEST 1 VIEW COMPARISON:  Previous studies including the examination of 10/29/2018 FINDINGS: Transverse diameter of heart is slightly increased. Thoracic aorta is tortuous. Low position of diaphragms suggests COPD. 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 There are no signs of alveolar pulmonary edema or new focal infiltrates. There is no pleural effusion or pneumothorax. IMPRESSION: COPD.  There are no new infiltrates or signs of pulmonary edema. Electronically Signed   By: Elmer Picker M.D.   On: 07/01/2021 15:03        Scheduled Meds:  aspirin EC  81 mg Oral Daily   atorvastatin  80 mg Oral Daily   cholecalciferol  400 Units Oral Daily   ezetimibe  10 mg Oral Daily   ferrous sulfate  325 mg Oral Q breakfast   folic acid  1  mg Oral Daily   ipratropium-albuterol  3 mL Nebulization TID   isosorbide mononitrate  30 mg Oral Daily   methylPREDNISolone (SOLU-MEDROL) injection  40 mg Intravenous Once   mometasone-formoterol  2 puff Inhalation BID   multivitamin with minerals  1 tablet Oral Daily   nicotine  21 mg Transdermal Daily   pantoprazole  20 mg Oral Daily   [START ON 07/04/2021] predniSONE  40 mg Oral Q breakfast   thiamine  100 mg Oral Daily   Or   thiamine  100 mg Intravenous Daily   Continuous Infusions:    Assessment & Plan:   Active Problems:   Essential hypertension   Tobacco use disorder   Mixed hyperlipidemia   Acute exacerbation of chronic obstructive pulmonary disease (COPD) (HCC)   Alcohol abuse   Coronary artery disease due to lipid rich plaque   Acute respiratory failure with hypoxia (HCC)   Lactic acidosis   AKI (acute kidney injury) ([place])   Influenza A with respiratory manifestations   Nausea and vomiting   Acute respiratory failure with hypoxia [facility]) ED physician has documented SaO2 in the high  eighties on room air.  10/31 improving slowly.  Off of oxygen at rest O2 sat 91 to 93%.  Will need to check ambulatory oxygen prior to discharge Likely due to influenza and COPD exacerbation Droplet precautions     AKI (acute kidney injury) ([place]) Likely prerenal. 10/31 improved with IV fluids, at baseline now    Lactic acidosis Likely due to influenza/dehydration 10/31 improved with IV fluid   Acute exacerbation of chronic obstructive pulmonary disease (COPD) ([place]) 2/2 influenza 10/31 mild wheezing on exam.  We will start IV steroid today and then transition to p.o. tomorrow Continue nebs and inhalers Weaned off of oxygen will need ambulatory O2 sats prior to discharge   Alcohol abuse Continue on CIWA protocol   Coronary artery disease due to lipid rich plaque Asx, no cp Continue aspirin, Zetia, statin, Imdur     Essential hypertension Stable continue to monitor Holding ARB due to AKI   Influenza A with respiratory manifestations Patient states that she has had symptoms for a week placing her well outside the window for antiviral therapy. Continue supportive therapy   nausea and vomiting Resolved   DVT prophylaxis: heparin Code Status:full Family Communication: None at bedside Disposition Plan: home Status is: Inpatient  Remains inpatient appropriate because: Still needs supportive care and IV medications as she is still symptomatic.          LOS: 2 days   Time spent: 35 minutes with more than 50% on [place], MD [facility] Pager [phone]  If 7PM-7AM, please contact night-coverage 07/03/2021, 2:34 PM

## 2021-07-04 LAB — CBC
HCT: 37.4 % (ref 36.0–46.0)
Hemoglobin: 12.9 g/dL (ref 12.0–15.0)
MCH: 32.7 pg (ref 26.0–34.0)
MCHC: 34.5 g/dL (ref 30.0–36.0)
MCV: 94.7 fL (ref 80.0–100.0)
Platelets: 116 10*3/uL — ABNORMAL LOW (ref 150–400)
RBC: 3.95 MIL/uL (ref 3.87–5.11)
RDW: 12.5 % (ref 11.5–15.5)
WBC: 3.9 10*3/uL — ABNORMAL LOW (ref 4.0–10.5)
nRBC: 0 % (ref 0.0–0.2)

## 2021-07-04 NOTE — Progress Notes (Addendum)
PROGRESS NOTE    [name]  EUM:[id number] DOB: 10/16/1955 DOA: 07/01/2021 PCP: Lavera Guise, MD    Brief Narrative:  The patient is a 65 yr old woman who presents from home. She states that she has been sick for a week, and that several members of her family have been sick. Her shortness of breath has been getting quite a bit worse. She states that she has not been able to eat for a week. Influenza + Covid negative  11/1 feels little better. Less sob, but not at baseline. Feels weak still. No cp   Consultants:    Procedures:   Antimicrobials:      Subjective: No abdominal pain, nausea, vomiting  Objective: Vitals:   07/03/21 1945 07/04/21 0446 07/04/21 0750 07/04/21 0803  BP:  119/78  102/72  Pulse:  64  76  Resp:  16  18  Temp:  98 F (36.7 C)  98.8 F (37.1 C)  TempSrc:  Oral  Oral  SpO2: 93% 99% 99% 93%  Weight:      Height:       No intake or output data in the 24 hours ending 07/04/21 0819  Filed Weights   07/01/21 1353  Weight: 51.3 kg    Examination: Nad, calm Decrease bs no wheezing Regular s1/s2 no gallop Soft benign +bs No edema aaxoxo3   Data Reviewed: I have personally reviewed following labs and imaging studies  CBC: Recent Labs  Lab 07/01/21 1410 07/01/21 1610 07/03/21 0500  WBC 5.4 4.3 2.9*  NEUTROABS 4.2  --   --   HGB 14.5 12.4 12.0  HCT 41.3 37.3 34.1*  MCV 98.6 97.6 97.4  PLT 117* 99* 96*   Basic Metabolic Panel: Recent Labs  Lab 07/01/21 1511 07/01/21 1610 07/03/21 0500  NA 132*  --  137  K 3.7  --  3.6  CL 98  --  102  CO2 23  --  26  GLUCOSE 99  --  84  BUN 24*  --  9  CREATININE 1.60* 1.36* 0.67  CALCIUM 8.8*  --  8.9   GFR: Estimated Creatinine Clearance: 56.8 mL/min (by C-G formula based on SCr of 0.67 mg/dL). Liver Function Tests: Recent Labs  Lab 07/01/21 1511  AST 56*  ALT 21  ALKPHOS 46  BILITOT 0.5  PROT 7.1  ALBUMIN 3.5   No results for input(s): LIPASE, AMYLASE in the last 168  hours. No results for input(s): AMMONIA in the last 168 hours. Coagulation Profile: No results for input(s): INR, PROTIME in the last 168 hours. Cardiac Enzymes: No results for input(s): CKTOTAL, CKMB, CKMBINDEX, TROPONINI in the last 168 hours. BNP (last 3 results) No results for input(s): PROBNP in the last 8760 hours. HbA1C: No results for input(s): HGBA1C in the last 72 hours. CBG: No results for input(s): GLUCAP in the last 168 hours. Lipid Profile: No results for input(s): CHOL, HDL, LDLCALC, TRIG, CHOLHDL, LDLDIRECT in the last 72 hours. Thyroid Function Tests: No results for input(s): TSH, T4TOTAL, FREET4, T3FREE, THYROIDAB in the last 72 hours. Anemia Panel: No results for input(s): VITAMINB12, FOLATE, FERRITIN, TIBC, IRON, RETICCTPCT in the last 72 hours. Sepsis Labs: Recent Labs  Lab 07/01/21 1425 07/01/21 1511 07/01/21 1610  PROCALCITON  --  1.15  --   LATICACIDVEN 2.6*  --  1.1    Recent Results (from the past 240 hour(s))  Resp Panel by RT-PCR (Flu A&B, Covid) Nasopharyngeal Swab     Status: Abnormal  Collection Time: 07/01/21  2:24 PM   Specimen: Nasopharyngeal Swab; Nasopharyngeal(NP) swabs in vial transport medium  Result Value Ref Range Status   SARS Coronavirus 2 by RT PCR NEGATIVE NEGATIVE Final    Comment: (NOTE) SARS-CoV-2 target nucleic acids are NOT DETECTED.  The SARS-CoV-2 RNA is generally detectable in upper respiratory specimens during the acute phase of infection. The lowest concentration of SARS-CoV-2 viral copies this assay can detect is 138 copies/mL. A negative result does not preclude SARS-Cov-2 infection and should not be used as the sole basis for treatment or other patient management decisions. A negative result may occur with  improper specimen collection/handling, submission of specimen other than nasopharyngeal swab, presence of viral mutation(s) within the areas targeted by this assay, and inadequate number of viral copies(<138  copies/mL). A negative result must be combined with clinical observations, patient history, and epidemiological information. The expected result is Negative.  Fact Sheet for Patients:  EntrepreneurPulse.com.au  Fact Sheet for Healthcare Providers:  IncredibleEmployment.be  This test is no t yet approved or cleared by the [place] FDA and  has been authorized for detection and/or diagnosis of SARS-CoV-2 by FDA under an Emergency Use Authorization (EUA). This EUA will remain  in effect (meaning this test can be used) for the duration of the COVID-19 declaration under Section 564(b)(1) of the Act, 21 U.S.C.section 360bbb-3(b)(1), unless the authorization is terminated  or revoked sooner.       Influenza A by PCR POSITIVE (A) NEGATIVE Final   Influenza B by PCR NEGATIVE NEGATIVE Final    Comment: (NOTE) The Xpert Xpress SARS-CoV-2/FLU/RSV plus assay is intended as an aid in the diagnosis of influenza from Nasopharyngeal swab specimens and should not be used as a sole basis for treatment. Nasal washings and aspirates are unacceptable for Xpert Xpress SARS-CoV-2/FLU/RSV testing.  Fact Sheet for Patients: EntrepreneurPulse.com.au  Fact Sheet for Healthcare Providers: IncredibleEmployment.be  This test is not yet approved or cleared by the [place] FDA and has been authorized for detection and/or diagnosis of SARS-CoV-2 by FDA under an Emergency Use Authorization (EUA). This EUA will remain in effect (meaning this test can be used) for the duration of the COVID-19 declaration under Section 564(b)(1) of the Act, 21 U.S.C. section 360bbb-3(b)(1), unless the authorization is terminated or revoked.  Performed at [facility], [address], [place], [place] [address]          Radiology Studies: No results found.      Scheduled Meds:  aspirin EC  81 mg Oral Daily   atorvastatin   80 mg Oral Daily   cholecalciferol  400 Units Oral Daily   ezetimibe  10 mg Oral Daily   ferrous sulfate  325 mg Oral Q breakfast   folic acid  1 mg Oral Daily   ipratropium-albuterol  3 mL Nebulization TID   isosorbide mononitrate  30 mg Oral Daily   mometasone-formoterol  2 puff Inhalation BID   multivitamin with minerals  1 tablet Oral Daily   nicotine  21 mg Transdermal Daily   pantoprazole  20 mg Oral Daily   predniSONE  40 mg Oral Q breakfast   thiamine  100 mg Oral Daily   Or   thiamine  100 mg Intravenous Daily   Continuous Infusions:    Assessment & Plan:   Active Problems:   Essential hypertension   Tobacco use disorder   Mixed hyperlipidemia   Acute exacerbation of chronic obstructive pulmonary disease (COPD) ([place])   Alcohol  abuse   Coronary artery disease due to lipid rich plaque   Acute respiratory failure with hypoxia (H[facility])   Lactic acidosis   AKI (acute kidney injury) ([place])   Influenza A with respiratory manifestations   Nausea and vomiting   Acute respiratory failure with hypoxia [facility]) ED physician has documented SaO2 in the high eighties on room air.  11/1 likely due to influenza and COPD exacerbation  started on iv steroid yesterday, helped with sx. Transition to po taper today Continue IS Droplet precautions Continue supportive care     AKI (acute kidney injury) ([place]) Improved with IV fluids    Lactic acidosis Likely due to influenza/dehydration 11/1 improved with IV fluids   Acute exacerbation of chronic obstructive pulmonary disease (COPD) ([place]) 2/2 influenza 11/1 improving with steroids.  Was given IV steroids yesterday we will transition to p.o. taper today  Continue inhalers and nebs  Currently on room air at rest, will need to check ambulatory oxygen prior to discharge    Neutropenia Thrombocytopenia: Likely from infection [place] may also be lower from etoh abuse Now improving Continue to monitor   Alcohol abuse Continue  CIWA protocol   Coronary artery disease due to lipid rich plaque Asx, no cp Continue aspirin, Zetia, statin, Imdur     Essential hypertension Stable continue to monitor Holding ARB due to AKI   Influenza A with respiratory manifestations Patient states that she has had symptoms for a week placing her well outside the window for antiviral therapy. Continue supportive therapy as above  nausea and vomiting Resolved   DVT prophylaxis: heparin Code Status:full Family Communication: None at bedside Disposition Plan: home Status is: Inpatient  Remains inpatient appropriate because: Still needs supportive care and IV medications as she is still symptomatic.  Possible DC in a.m.          LOS: 3 days   Time spent: 35 minutes with more than 50% on [place], MD [facility] Pager [phone]  If 7PM-7AM, please contact night-coverage 07/04/2021, 8:19 AM

## 2021-07-04 NOTE — Care Management Important Message (Signed)
Important Message  Patient Details  Name: [name] MRN: [id number] Date of Birth: 10-23-1955   Medicare Important Message Given:  N/A - LOS <3 / Initial given by admissions  Initial Medicare IM reviewed with patient via room phone by Thornton Dales, Patient Access Associate on 07/04/2021 at 9:20am.   Dannette Barbara 07/04/2021, 2:46 PM

## 2021-07-05 LAB — CBC
HCT: 37 % (ref 36.0–46.0)
Hemoglobin: 12.8 g/dL (ref 12.0–15.0)
MCH: 32.9 pg (ref 26.0–34.0)
MCHC: 34.6 g/dL (ref 30.0–36.0)
MCV: 95.1 fL (ref 80.0–100.0)
Platelets: 125 10*3/uL — ABNORMAL LOW (ref 150–400)
RBC: 3.89 MIL/uL (ref 3.87–5.11)
RDW: 12.5 % (ref 11.5–15.5)
WBC: 5.2 10*3/uL (ref 4.0–10.5)
nRBC: 0 % (ref 0.0–0.2)

## 2021-07-05 MED ORDER — NICOTINE 21 MG/24HR TD PT24
21.0000 mg | MEDICATED_PATCH | Freq: Every day | TRANSDERMAL | 0 refills | Status: DC
Start: 2021-07-06 — End: 2021-08-06

## 2021-07-05 MED ORDER — METHYLPREDNISOLONE 4 MG PO TBPK: ORAL_TABLET | ORAL | 0 refills | Status: DC

## 2021-07-05 NOTE — Care Management (Signed)
Patient to discharge home today.   Discussed substance abuse resources  Patient declines resources

## 2021-07-05 NOTE — Evaluation (Signed)
Physical Therapy Evaluation Patient Details Name: [name] MRN: [id number] DOB: 1956/06/01 Today's Date: 07/05/2021  History of Present Illness  [name] is a 11yoF who [facility] 10[facility] c cough, SOB, nausea, diarrhea. In ED blood pressure as low as 70/42 that responded well to IV fluid resuscitation. She has tested negative for COVID, but positive for influenza A. PMH: CAD s/p MI, COPD, HTN, HLD, ETOH, nicotine addition, carotid stenosis, PVD.  Clinical Impression  Pt admitted with above diagnosis. Pt currently with functional limitations due to the deficits listed below (see "PT Problem List"). Patient agreeable to PT evaluation. Patient provides detailed description of PLOF and home environment. Patient's assessment this date reveals baseline level of function, no assistance needs. Patient is at baseline, all education completed, and time is given to address all questions/concerns. No additional skilled PT services needed at this time, PT signing off. PT recommends daily ambulation ad lib or with nursing staff as needed to prevent deconditioning.      Recommendations for follow up therapy are one component of a multi-disciplinary discharge planning process, led by the attending physician.  Recommendations may be updated based on patient status, additional functional criteria and insurance authorization.  Follow Up Recommendations No PT follow up    Assistance Recommended at Discharge None  Functional Status Assessment    Equipment Recommendations  None recommended by PT    Recommendations for Other Services       Precautions / Restrictions Precautions Precautions: None Restrictions Weight Bearing Restrictions: No      Mobility  Bed Mobility Overal bed mobility: Independent                  Transfers Overall transfer level: Independent                      Ambulation/Gait Ambulation/Gait assistance: Independent Gait Distance (Feet): 150  Feet Assistive device: None          Stairs            Wheelchair Mobility    Modified Rankin (Stroke Patients Only)       Balance                                             Pertinent Vitals/Pain      Home Living Family/patient expects to be discharged to:: Private residence Living Arrangements: Children;Other relatives (2 DTR, 1 grandson) Available Help at Discharge: Family;Available 24 hours/day Type of Home: Apartment Home Access: Stairs to enter Entrance Stairs-Rails: Right Entrance Stairs-Number of Steps: 2   Home Layout: One level Home Equipment: Conservation officer, nature (2 wheels);Cane - single point      Prior Function Prior Level of Function : Independent/Modified Independent             Mobility Comments: no device use, intermittent community distances for IADL; no falls or close calls in past 6 months. ADLs Comments: independent,     Hand Dominance        Extremity/Trunk Assessment                Communication   Communication: No difficulties  Cognition  General Comments      Exercises     Assessment/Plan    PT Assessment Patient does not need any further PT services  PT Problem List Decreased balance;Decreased activity tolerance       PT Treatment Interventions      PT Goals (Current goals can be found in the Care Plan section)  Acute Rehab PT Goals PT Goal Formulation: All assessment and education complete, DC therapy    Frequency     Barriers to discharge        Co-evaluation               AM-PAC PT "6 Clicks" Mobility  Outcome Measure Help needed turning from your back to your side while in a flat bed without using bedrails?: None Help needed moving from lying on your back to sitting on the side of a flat bed without using bedrails?: None Help needed moving to and from a bed to a chair (including a wheelchair)?:  None Help needed standing up from a chair using your arms (e.g., wheelchair or bedside chair)?: None Help needed to walk in hospital room?: None Help needed climbing 3-5 steps with a railing? : None 6 Click Score: 24    End of Session   Activity Tolerance: Patient tolerated treatment well;No increased pain Patient left: in bed   PT Visit Diagnosis: Other abnormalities of gait and mobility (R26.89);Difficulty in walking, not elsewhere classified (R26.2)    Time: 6967-8938 PT Time Calculation (min) (ACUTE ONLY): 12 min   Charges:   PT Evaluation $PT Eval Low Complexity: 1 Low         12:16 PM, 07/05/21 Etta Grandchild, PT, DPT Physical Therapist - [facility]  [phone] ([place])    [place] C 07/05/2021, 12:15 PM

## 2021-07-05 NOTE — Discharge Summary (Signed)
Physician Discharge Summary  [name] WLS:[id number] DOB: 08-24-1956 DOA: 07/01/2021  PCP: Lavera Guise, MD  Admit date: 07/01/2021 Discharge date: 07/05/2021  Admitted From: Home Disposition: Home  Recommendations for Outpatient Follow-up:  Follow up with PCP in 1-2 weeks   Home Health: No Equipment/Devices: None  Discharge Condition: Stable CODE STATUS: Full Diet recommendation: Regular  Brief/Interim Summary: 64 yr old woman who presents from home. She states that she has been sick for a week, and that several members of her family have been sick. Her shortness of breath has been getting quite a bit worse. She states that she has not been able to eat for a week. Influenza + Covid negative   11/1 feels little better. Less sob, but not at baseline. Feels weak still. No cp 11/2 patient back to baseline.  Not short of breath.  Still feels weak.  No chest pain.  Seen by PT.  No PT follow-up recommended.  Stable discharge home at this time.  Follow-up outpatient PCP 1 week.    Discharge Diagnoses:  Active Problems:   Essential hypertension   Tobacco use disorder   Mixed hyperlipidemia   Acute exacerbation of chronic obstructive pulmonary disease (COPD) (HCC)   Alcohol abuse   Coronary artery disease due to lipid rich plaque   Acute respiratory failure with hypoxia (HCC)   Lactic acidosis   AKI (acute kidney injury) ([place])   Influenza A with respiratory manifestations   Nausea and vomiting Acute respiratory failure with hypoxia [facility]) ED physician has documented SaO2 in the high eighties on room air.  11/1 likely due to influenza and COPD exacerbation  started on iv steroid yesterday, helped with sx. Transition to po taper today Continue IS Droplet precautions Continue supportive care  Influenza A with respiratory manifestations Patient states that she has had symptoms for a week placing her well outside the window for antiviral therapy.  Stable on room air at time  of discharge      AKI (acute kidney injury) ([place]) Improved with IV fluids     Lactic acidosis Likely due to influenza/dehydration Improved with IV fluids   Acute exacerbation of chronic obstructive pulmonary disease (COPD) ([place]) 2/2 influenza 11/1 improving with steroids.  Transition to Medrol Dosepak at time of discharge     Neutropenia Thrombocytopenia: Likely from infection [place] may also be lower from etoh abuse Now improving Outpatient follow-up   Alcohol abuse No signs of withdrawal while admitted     Coronary artery disease due to lipid rich plaque Asx, no cp Continue aspirin, Zetia, statin, Imdur      Essential hypertension Stable continue to monitor Holding ARB due to AKI.  Can resume at time of discharge     nausea and vomiting Resolved.  Tolerating p.o. at time of discharge  Discharge Instructions  Discharge Instructions     Diet - low sodium heart healthy   Complete by: As directed    Increase activity slowly   Complete by: As directed       Allergies as of 07/05/2021       Reactions   Aspirin Nausea And Vomiting, Other (See Comments)   Pt states aspirin makes her cramp and have to use coated kind.   Tramadol    Zyrtec [cetirizine]         Medication List     TAKE these medications    albuterol 108 (90 Base) MCG/ACT inhaler Commonly known as: VENTOLIN HFA Inhale 2 puffs into the lungs every 6 (  six) hours as needed for wheezing or shortness of breath.   aspirin 81 MG EC tablet Take 1 tablet (81 mg total) by mouth daily.   atorvastatin 80 MG tablet Commonly known as: LIPITOR Take 1 tablet (80 mg total) by mouth daily.   budesonide-formoterol 80-4.5 MCG/ACT inhaler Commonly known as: SYMBICORT Inhale 2 puffs into the lungs daily.   cholecalciferol 10 MCG (400 UNIT) Tabs tablet Commonly known as: VITAMIN D3 Take 400 Units by mouth.   ezetimibe 10 MG tablet Commonly known as: ZETIA Take 1 tablet (10 mg total) by mouth  daily.   ferrous sulfate 325 (65 FE) MG tablet Take 325 mg by mouth daily.   isosorbide mononitrate 30 MG 24 hr tablet Commonly known as: IMDUR Take 1 tablet (30 mg total) by mouth daily.   losartan 50 MG tablet Commonly known as: COZAAR Take 1 tablet (50 mg total) by mouth daily.   methylPREDNISolone 4 MG Tbpk tablet Commonly known as: MEDROL DOSEPAK Medrol dosepak.  Please complete as per package directions   nicotine 21 mg/24hr patch Commonly known as: NICODERM CQ - dosed in mg/24 hours Place 1 patch (21 mg total) onto the skin daily. Start taking on: July 06, 2021   Repatha 140 MG/ML Sosy Generic drug: Evolocumab Use as directed by cardiology        Allergies  Allergen Reactions   Aspirin Nausea And Vomiting and Other (See Comments)    Pt states aspirin makes her cramp and have to use coated kind.   Tramadol    Zyrtec [Cetirizine]     Consultations: None   Procedures/Studies: DG Chest Portable 1 View  Result Date: 07/01/2021 CLINICAL DATA:  Cough, dizziness EXAM: PORTABLE CHEST 1 VIEW COMPARISON:  Previous studies including the examination of 10/29/2018 FINDINGS: Transverse diameter of heart is slightly increased. Thoracic aorta is tortuous. Low position of diaphragms suggests COPD. 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 There are no signs of alveolar pulmonary edema or new focal infiltrates. There is no pleural effusion or pneumothorax. IMPRESSION: COPD.  There are no new infiltrates or signs of pulmonary edema. Electronically Signed   By: Elmer Picker M.D.   On: 07/01/2021 15:03   VAS [place] ABI WITH/WO TBI  Result Date: 06/20/2021  LOWER EXTREMITY DOPPLER STUDY Patient Name:  [name]  Date of Exam:   06/14/2021 Medical Rec #: [id number]       Accession #:    [id number] Date of Birth: 06/20/1956        Patient Gender: F Patient Age:   40 years Exam Location:  [facility] Procedure:      VAS [place] ABI  WITH/WO TBI Referring Phys: --------------------------------------------------------------------------------  Indications: Peripheral artery disease.  Vascular Interventions: 11/26/2011 Right SFA popliteal artery PTA / Stent                         12/24/2011 Left SFA / popliteal artery PTA / stent with                         TP trunk PTA                         04/30/2012 Right SFA & proximal EIA PTA's  04/25/2015 Left CFA, PFA angioplasty / endasterectomies                         with left SFA & popliteal emboectomies                         01/02/2016 Left CFA PTA. Comparison Study: 06/2020 Performing Technologist: Concha Norway RVT  Examination Guidelines: A complete evaluation includes at minimum, Doppler waveform signals and systolic blood pressure reading at the level of bilateral brachial, anterior tibial, and posterior tibial arteries, when vessel segments are accessible. Bilateral testing is considered an integral part of a complete examination. Photoelectric Plethysmograph (PPG) waveforms and toe systolic pressure readings are included as required and additional duplex testing as needed. Limited examinations for reoccurring indications may be performed as noted.  ABI Findings: +---------+------------------+-----+---------+--------+ Right    Rt Pressure (mmHg)IndexWaveform Comment  +---------+------------------+-----+---------+--------+ Brachial 168                                      +---------+------------------+-----+---------+--------+ ATA      174                    triphasic1.04     +---------+------------------+-----+---------+--------+ PTA                             absent            +---------+------------------+-----+---------+--------+ Great Toe150               0.89 Normal            +---------+------------------+-----+---------+--------+ +---------+------------------+-----+--------+---------------+ Left     Lt Pressure  (mmHg)IndexWaveformComment         +---------+------------------+-----+--------+---------------+ Brachial 131                            Known sca steal +---------+------------------+-----+--------+---------------+ ATA      189                    biphasic1.10            +---------+------------------+-----+--------+---------------+ PTA      180               1.07 biphasic                +---------+------------------+-----+--------+---------------+ Great Toe138               0.82 Normal                  +---------+------------------+-----+--------+---------------+ +-------+-----------+-----------+------------+------------+ ABI/TBIToday's ABIToday's TBIPrevious ABIPrevious TBI +-------+-----------+-----------+------------+------------+ Right  1.04       .89        1.04        .96          +-------+-----------+-----------+------------+------------+ Left   1.10       .82        .98         .86          +-------+-----------+-----------+------------+------------+ Bilateral ABIs appear essentially unchanged compared to prior study on 06/2020.  Summary: Right: Resting right ankle-brachial index is within normal range. No evidence of significant right lower extremity arterial disease. The right toe-brachial index is normal. Distal PTA occluded as was seen previously. Left: Resting left ankle-brachial  index is within normal range. No evidence of significant left lower extremity arterial disease. The left toe-brachial index is normal.  *See table(s) above for measurements and observations.  Electronically signed by Leotis Pain MD on 06/20/2021 at 9:11:47 AM.    Final    VAS US CAROTID  Result Date: 06/20/2021 Carotid Arterial Duplex Study Patient Name:  [name]  Date of Exam:   06/14/2021 Medical Rec #: [id number]       Accession #:    [id number] Date of Birth: 29-Feb-1956        Patient Gender: F Patient Age:   66 years Exam Location:  [facility] Procedure:       VAS US CAROTID Referring Phys: Eulogio Ditch --------------------------------------------------------------------------------  Indications:       Carotid artery disease. Comparison Study:  06/2020 Performing Technologist: Concha Norway RVT  Examination Guidelines: A complete evaluation includes B-mode imaging, spectral Doppler, color Doppler, and power Doppler as needed of all accessible portions of each vessel. Bilateral testing is considered an integral part of a complete examination. Limited examinations for reoccurring indications may be performed as noted.  Right Carotid Findings: +----------+--------+--------+--------+------------------+--------+           PSV cm/sEDV cm/sStenosisPlaque DescriptionComments +----------+--------+--------+--------+------------------+--------+ CCA Prox  71      17                                         +----------+--------+--------+--------+------------------+--------+ CCA Mid   51      19                                         +----------+--------+--------+--------+------------------+--------+ CCA Distal75      22                                         +----------+--------+--------+--------+------------------+--------+ ICA Prox  80      22      1-39%   calcific                   +----------+--------+--------+--------+------------------+--------+ ICA Mid   67      18                                         +----------+--------+--------+--------+------------------+--------+ ICA Distal57      17                                         +----------+--------+--------+--------+------------------+--------+ ECA       140     27                                         +----------+--------+--------+--------+------------------+--------+ +----------+--------+-------+----------------+-------------------+           PSV cm/sEDV cmsDescribe        Arm Pressure (mmHG) +----------+--------+-------+----------------+-------------------+  [id number]            Multiphasic, WNL                    +----------+--------+-------+----------------+-------------------+ +---------+--------+---+--------+--+---------+  VertebralPSV cm/s177EDV cm/s62Antegrade +---------+--------+---+--------+--+---------+  Left Carotid Findings: +----------+--------+--------+--------+------------------+--------+           PSV cm/sEDV cm/sStenosisPlaque DescriptionComments +----------+--------+--------+--------+------------------+--------+ CCA Prox  64      21                                         +----------+--------+--------+--------+------------------+--------+ CCA Mid   80      32                                         +----------+--------+--------+--------+------------------+--------+ CCA Distal71      27                                         +----------+--------+--------+--------+------------------+--------+ ICA Prox  91      16      1-39%                              +----------+--------+--------+--------+------------------+--------+ ICA Mid   62      25                                         +----------+--------+--------+--------+------------------+--------+ ICA Distal49      20                                         +----------+--------+--------+--------+------------------+--------+ ECA       529     134                                        +----------+--------+--------+--------+------------------+--------+ +----------+--------+--------+----------+-------------------+           PSV cm/sEDV cm/sDescribe  Arm Pressure (mmHG) +----------+--------+--------+----------+-------------------+ [id number]              monophasic                    +----------+--------+--------+----------+-------------------+ +---------+--------+---+--------+--+----------+ VertebralPSV cm/s114EDV cm/s49Retrograde +---------+--------+---+--------+--+----------+   Summary: Right Carotid: Velocities in  the right ICA are consistent with a 1-39% stenosis.                Non-hemodynamically significant plaque <50% noted in the CCA. The                ECA appears <50% stenosed. Left Carotid: Velocities in the left ICA are consistent with a 1-39% stenosis.               Non-hemodynamically significant plaque <50% noted in the CCA. The               ECA appears >50% stenosed. Vertebrals:  Right vertebral artery demonstrates antegrade flow. Left vertebral              artery demonstrates retrograde flow. Subclavians: Normal flow hemodynamics were seen in the right subclavian artery.  Left monophasic flow. Known steal. *See table(s) above for measurements and observations.  Electronically signed by Leotis Pain MD on 06/20/2021 at 9:11:37 AM.    Final       Subjective: Seen and examined on the day of discharge.  Fatigued but stable.  Seen by PT.  No PT follow-up recommended.  Stable for discharge home.  Discharge Exam: Vitals:   07/05/21 0732 07/05/21 0822  BP:  118/74  Pulse:  67  Resp:  20  Temp:  97.6 F (36.4 C)  SpO2: 99% 97%   Vitals:   07/04/21 2141 07/05/21 0514 07/05/21 0732 07/05/21 0822  BP: 114/79 (!) 120/92  118/74  Pulse: 74 63  67  Resp: 17 16  20   Temp: (!) 97.5 F (36.4 C) 97.7 F (36.5 C)  97.6 F (36.4 C)  TempSrc: Oral Oral  Oral  SpO2: 95% 96% 99% 97%  Weight:      Height:        General: Pt is alert, awake, not in acute distress Cardiovascular: RRR, S1/S2 +, no rubs, no gallops Respiratory: CTA bilaterally, no wheezing, no rhonchi Abdominal: Soft, NT, ND, bowel sounds + Extremities: no edema, no cyanosis    The results of significant diagnostics from this hospitalization (including imaging, microbiology, ancillary and laboratory) are listed below for reference.     Microbiology: Recent Results (from the past 240 hour(s))  Resp Panel by RT-PCR (Flu A&B, Covid) Nasopharyngeal Swab     Status: Abnormal   Collection Time: 07/01/21  2:24 PM    Specimen: Nasopharyngeal Swab; Nasopharyngeal(NP) swabs in vial transport medium  Result Value Ref Range Status   SARS Coronavirus 2 by RT PCR NEGATIVE NEGATIVE Final    Comment: (NOTE) SARS-CoV-2 target nucleic acids are NOT DETECTED.  The SARS-CoV-2 RNA is generally detectable in upper respiratory specimens during the acute phase of infection. The lowest concentration of SARS-CoV-2 viral copies this assay can detect is 138 copies/mL. A negative result does not preclude SARS-Cov-2 infection and should not be used as the sole basis for treatment or other patient management decisions. A negative result may occur with  improper specimen collection/handling, submission of specimen other than nasopharyngeal swab, presence of viral mutation(s) within the areas targeted by this assay, and inadequate number of viral copies(<138 copies/mL). A negative result must be combined with clinical observations, patient history, and epidemiological information. The expected result is Negative.  Fact Sheet for Patients:  EntrepreneurPulse.com.au  Fact Sheet for Healthcare Providers:  IncredibleEmployment.be  This test is no t yet approved or cleared by the [place] FDA and  has been authorized for detection and/or diagnosis of SARS-CoV-2 by FDA under an Emergency Use Authorization (EUA). This EUA will remain  in effect (meaning this test can be used) for the duration of the COVID-19 declaration under Section 564(b)(1) of the Act, 21 U.S.C.section 360bbb-3(b)(1), unless the authorization is terminated  or revoked sooner.       Influenza A by PCR POSITIVE (A) NEGATIVE Final   Influenza B by PCR NEGATIVE NEGATIVE Final    Comment: (NOTE) The Xpert Xpress SARS-CoV-2/FLU/RSV plus assay is intended as an aid in the diagnosis of influenza from Nasopharyngeal swab specimens and should not be used as a sole basis for treatment. Nasal washings and aspirates are  unacceptable for Xpert Xpress SARS-CoV-2/FLU/RSV testing.  Fact Sheet for Patients: EntrepreneurPulse.com.au  Fact Sheet for Healthcare Providers: IncredibleEmployment.be  This test is not yet approved or cleared by the [place] FDA and has  been authorized for detection and/or diagnosis of SARS-CoV-2 by FDA under an Emergency Use Authorization (EUA). This EUA will remain in effect (meaning this test can be used) for the duration of the COVID-19 declaration under Section 564(b)(1) of the Act, 21 U.S.C. section 360bbb-3(b)(1), unless the authorization is terminated or revoked.  Performed at [facility], [address] [place], [place] [address]      Labs: BNP (last 3 results) Recent Labs    07/01/21 1410  BNP 93.7   Basic Metabolic Panel: Recent Labs  Lab 07/01/21 1511 07/01/21 1610 07/03/21 0500  NA 132*  --  137  K 3.7  --  3.6  CL 98  --  102  CO2 23  --  26  GLUCOSE 99  --  84  BUN 24*  --  9  CREATININE 1.60* 1.36* 0.67  CALCIUM 8.8*  --  8.9   Liver Function Tests: Recent Labs  Lab 07/01/21 1511  AST 56*  ALT 21  ALKPHOS 46  BILITOT 0.5  PROT 7.1  ALBUMIN 3.5   No results for input(s): LIPASE, AMYLASE in the last 168 hours. No results for input(s): AMMONIA in the last 168 hours. CBC: Recent Labs  Lab 07/01/21 1410 07/01/21 1610 07/03/21 0500 07/04/21 0850 07/05/21 0505  WBC 5.4 4.3 2.9* 3.9* 5.2  NEUTROABS 4.2  --   --   --   --   HGB 14.5 12.4 12.0 12.9 12.8  HCT 41.3 37.3 34.1* 37.4 37.0  MCV 98.6 97.6 97.4 94.7 95.1  PLT 117* 99* 96* 116* 125*   Cardiac Enzymes: No results for input(s): CKTOTAL, CKMB, CKMBINDEX, TROPONINI in the last 168 hours. BNP: Invalid input(s): POCBNP CBG: No results for input(s): GLUCAP in the last 168 hours. D-Dimer No results for input(s): DDIMER in the last 72 hours. Hgb A1c No results for input(s): HGBA1C in the last 72 hours. Lipid Profile No  results for input(s): CHOL, HDL, LDLCALC, TRIG, CHOLHDL, LDLDIRECT in the last 72 hours. Thyroid function studies No results for input(s): TSH, T4TOTAL, T3FREE, THYROIDAB in the last 72 hours.  Invalid input(s): FREET3 Anemia work up No results for input(s): VITAMINB12, FOLATE, FERRITIN, TIBC, IRON, RETICCTPCT in the last 72 hours. Urinalysis    Component Value Date/Time   COLORURINE STRAW (A) 12/31/2020 0103   APPEARANCEUR Turbid (A) 05/19/2021 1551   LABSPEC 1.031 (H) 12/31/2020 0103   LABSPEC 1.015 10/15/2013 1124   PHURINE 5.0 12/31/2020 0103   GLUCOSEU Negative 05/19/2021 1551   GLUCOSEU Negative 10/15/2013 1124   HGBUR NEGATIVE 12/31/2020 0103   BILIRUBINUR Negative 05/19/2021 1551   BILIRUBINUR Negative 10/15/2013 1124   KETONESUR NEGATIVE 12/31/2020 0103   PROTEINUR 2+ (A) 05/19/2021 1551   PROTEINUR NEGATIVE 12/31/2020 0103   NITRITE Negative 05/19/2021 1551   NITRITE NEGATIVE 12/31/2020 0103   LEUKOCYTESUR Negative 05/19/2021 1551   LEUKOCYTESUR NEGATIVE 12/31/2020 0103   LEUKOCYTESUR Negative 10/15/2013 1124   Sepsis Labs Invalid input(s): PROCALCITONIN,  WBC,  LACTICIDVEN Microbiology Recent Results (from the past 240 hour(s))  Resp Panel by RT-PCR (Flu A&B, Covid) Nasopharyngeal Swab     Status: Abnormal   Collection Time: 07/01/21  2:24 PM   Specimen: Nasopharyngeal Swab; Nasopharyngeal(NP) swabs in vial transport medium  Result Value Ref Range Status   SARS Coronavirus 2 by RT PCR NEGATIVE NEGATIVE Final    Comment: (NOTE) SARS-CoV-2 target nucleic acids are NOT DETECTED.  The SARS-CoV-2 RNA is generally detectable in upper respiratory specimens during the acute phase of  infection. The lowest concentration of SARS-CoV-2 viral copies this assay can detect is 138 copies/mL. A negative result does not preclude SARS-Cov-2 infection and should not be used as the sole basis for treatment or other patient management decisions. A negative result may occur with   improper specimen collection/handling, submission of specimen other than nasopharyngeal swab, presence of viral mutation(s) within the areas targeted by this assay, and inadequate number of viral copies(<138 copies/mL). A negative result must be combined with clinical observations, patient history, and epidemiological information. The expected result is Negative.  Fact Sheet for Patients:  EntrepreneurPulse.com.au  Fact Sheet for Healthcare Providers:  IncredibleEmployment.be  This test is no t yet approved or cleared by the [place] FDA and  has been authorized for detection and/or diagnosis of SARS-CoV-2 by FDA under an Emergency Use Authorization (EUA). This EUA will remain  in effect (meaning this test can be used) for the duration of the COVID-19 declaration under Section 564(b)(1) of the Act, 21 U.S.C.section 360bbb-3(b)(1), unless the authorization is terminated  or revoked sooner.       Influenza A by PCR POSITIVE (A) NEGATIVE Final   Influenza B by PCR NEGATIVE NEGATIVE Final    Comment: (NOTE) The Xpert Xpress SARS-CoV-2/FLU/RSV plus assay is intended as an aid in the diagnosis of influenza from Nasopharyngeal swab specimens and should not be used as a sole basis for treatment. Nasal washings and aspirates are unacceptable for Xpert Xpress SARS-CoV-2/FLU/RSV testing.  Fact Sheet for Patients: EntrepreneurPulse.com.au  Fact Sheet for Healthcare Providers: IncredibleEmployment.be  This test is not yet approved or cleared by the [place] FDA and has been authorized for detection and/or diagnosis of SARS-CoV-2 by FDA under an Emergency Use Authorization (EUA). This EUA will remain in effect (meaning this test can be used) for the duration of the COVID-19 declaration under Section 564(b)(1) of the Act, 21 U.S.C. section 360bbb-3(b)(1), unless the authorization is terminated  or revoked.  Performed at [facility], [address] [place], [place] [address]      Time coordinating discharge: Over 30 minutes  SIGNED:   Sidney Ace, MD  [facility] 07/05/2021, 12:51 PM Pager   If 7PM-7AM, please contact night-coverage

## 2021-07-05 NOTE — Progress Notes (Signed)
Pt discharged per MD order. IV removed. Discharge instructions reviewed with pt. Pt verbalized understanding. All questions answered to pt satisfaction. Pt taken to car in wheelchair by staff.

## 2021-07-06 ENCOUNTER — Telehealth: Payer: Self-pay

## 2021-07-06 NOTE — Telephone Encounter (Signed)
Unable to reach patient on her phone. Lvm w/ her aunt, [place], for either her or patient to return my call to schedule hospital follow up appointment-Toni

## 2021-07-07 ENCOUNTER — Telehealth: Payer: Self-pay

## 2021-07-07 NOTE — Telephone Encounter (Signed)
Patient returned call. I attempted to call her back. Phone just rings. No answer-[name]

## 2021-07-10 ENCOUNTER — Telehealth: Payer: Self-pay

## 2021-07-10 NOTE — Telephone Encounter (Signed)
Lvm for patient to call office to schedule hospital f/u appointment-Toni

## 2021-07-13 ENCOUNTER — Other Ambulatory Visit: Payer: Self-pay

## 2021-07-13 ENCOUNTER — Encounter: Payer: Self-pay | Admitting: Nurse Practitioner

## 2021-07-13 ENCOUNTER — Ambulatory Visit (INDEPENDENT_AMBULATORY_CARE_PROVIDER_SITE_OTHER): Payer: Medicare Other | Admitting: Nurse Practitioner

## 2021-07-13 VITALS — BP 140/69 | HR 90 | Temp 98.1°F | Resp 16 | Ht 64.0 in | Wt 112.6 lb

## 2021-07-13 DIAGNOSIS — J101 Influenza due to other identified influenza virus with other respiratory manifestations: Secondary | ICD-10-CM

## 2021-07-13 DIAGNOSIS — I10 Essential (primary) hypertension: Secondary | ICD-10-CM

## 2021-07-13 DIAGNOSIS — J9601 Acute respiratory failure with hypoxia: Secondary | ICD-10-CM | POA: Diagnosis not present

## 2021-07-13 DIAGNOSIS — J449 Chronic obstructive pulmonary disease, unspecified: Secondary | ICD-10-CM

## 2021-07-13 NOTE — Progress Notes (Signed)
[facility] [place] [id number] [facility] Discharge Acute Issues Care Follow Up                                                                        Patient Demographics  [name], is a 65 y.o. female  DOB May 31, 1956  MRN [id number].  Primary MD  Lavera Guise, MD   Reason for TCC follow Up - Influenza   Past Medical History:  Diagnosis Date   Allergy    Environmental   Arthritis    Asthma    Carotid artery stenosis    Carotid stenosis    COPD (chronic obstructive pulmonary disease) (HCC)    Coronary artery disease    Coronary artery disease    Hemorrhoids    Hyperlipidemia    Hypertension    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(HCC)    negative cardiac cath   Nicotine addiction    Peripheral vascular disease (HCC)    Pneumonia    Shortness of breath dyspnea    Tobacco abuse     Past Surgical History:  Procedure Laterality Date   ABDOMINAL HYSTERECTOMY     APPENDECTOMY     CARDIAC CATHETERIZATION     CARDIAC SURGERY     CHOLECYSTECTOMY     COLONOSCOPY  12/15/11   OH->bleeding internal hemorrhoids, otherwise normal   COLONOSCOPY WITH PROPOFOL N/A 04/12/2015   Procedure: COLONOSCOPY WITH PROPOFOL;  Surgeon: Lucilla Lame, MD;  Location: [facility] ENDOSCOPY;  Service: Endoscopy;  Laterality: N/A;   COLONOSCOPY WITH PROPOFOL N/A 01/05/2020   Procedure: COLONOSCOPY WITH PROPOFOL;  Surgeon: Lucilla Lame, MD;  Location: [facility] ENDOSCOPY;  Service: Endoscopy;  Laterality: N/A;   ENDARTERECTOMY FEMORAL Left 04/26/2015   Procedure: ENDARTERECTOMY FEMORAL;  Surgeon: Algernon Huxley, MD;  Location: [facility] ORS;  Service: Vascular;  Laterality: Left;   ENDOVASCULAR STENT INSERTION Bilateral    Legs   FLEXIBLE BRONCHOSCOPY N/A 02/02/2015   Procedure: FLEXIBLE BRONCHOSCOPY;  Surgeon: Allyne Gee, MD;  Location: [facility] ORS;  Service: Pulmonary;  Laterality: N/A;    HEMORRHOID SURGERY     PERIPHERAL VASCULAR CATHETERIZATION Left 04/25/2015   Procedure: Lower Extremity Angiography;  Surgeon: Algernon Huxley, MD;  Location: [place] [facility];  Service: Cardiovascular;  Laterality: Left;   PERIPHERAL VASCULAR CATHETERIZATION Left 04/25/2015   Procedure: Lower Extremity Intervention;  Surgeon: Algernon Huxley, MD;  Location: [place] [facility];  Service: Cardiovascular;  Laterality: Left;   PERIPHERAL VASCULAR CATHETERIZATION Left 01/02/2016   Procedure: Lower Extremity Angiography;  Surgeon: Algernon Huxley, MD;  Location: [place] [facility];  Service: Cardiovascular;  Laterality: Left;   PERIPHERAL VASCULAR CATHETERIZATION  01/02/2016   Procedure: Lower Extremity Intervention;  Surgeon: Algernon Huxley, MD;  Location: [place] [facility];  Service: Cardiovascular;;   THROMBECTOMY FEMORAL ARTERY Left 04/26/2015   Procedure: THROMBECTOMY FEMORAL ARTERY;  Surgeon: Algernon Huxley, MD;  Location: [facility] ORS;  Service: Vascular;  Laterality: Left;  Recent HPI and Hospital Course  65 yr old woman who presents from home. She states that she has been sick for a week, and that several members of her family have been sick. Her shortness of breath has been getting quite a bit worse. She states that she has not been able to eat for a week. Influenza + Covid negative   11/1 feels little better. Less sob, but not at baseline. Feels weak still. No cp 11/2 patient back to baseline.  Not short of breath.  Still feels weak.  No chest pain.  Seen by PT.  No PT follow-up recommended.  Stable discharge home at this time.  Follow-up outpatient PCP 1 week.  [facility] Acute Care Issue to be followed in the Clinic   Essential hypertension   Tobacco use disorder   Mixed hyperlipidemia   Acute exacerbation of chronic obstructive pulmonary disease (COPD) ([place])   Alcohol abuse   Coronary artery disease due to lipid rich plaque   Acute respiratory failure with hypoxia (HCC)   Lactic  acidosis   AKI (acute kidney injury) ([place])   Influenza A with respiratory manifestations   Nausea and vomiting Acute respiratory failure with hypoxia (HCC)   Subjective:   [name] today has slight intermittent SOB, lingering cough, denies headache, No chest pain, No abdominal pain - No Nausea, No new weakness tingling or numbness.   Assessment & Plan    1. Acute respiratory failure with hypoxia (HCC) Admitted for respiratory failure as a complication of influenza infection. She was stabilized and complications were addressed and treated. She was discharged on 07/05/21. Some linger SOB and cough but otherwise stable. She is stable on room air, no signs of acute distress noted at today's visit.   2. Influenza A with respiratory manifestations Was unable to get treatment with antiviral due to patient waiting for >7 days to seek treatment, was admitted to hospital due to complications from influenza. Complications were addressed and treated. Her infection has resolved and she is no longer infectious but she has lingering symptoms such as some SOB and cough.   3. Chronic obstructive pulmonary disease, unspecified COPD type ([place]) She has COPD, she uses symbicort. She has an albuterol rescue inhaler that she uses as needed for SOB, wheezing and cough. She continues to smoke cigarettes but is considering smoking cessation with nicotine patches.   4. Essential hypertension Stable with current medications.    Reason for frequent admissions/ER visits  COPD Respiratory failure      Objective:   Vitals:   07/13/21 1141  BP: 140/69  Pulse: 90  Resp: 16  Temp: 98.1 F (36.7 C)  SpO2: 98%  Weight: 112 lb 9.6 oz (51.1 kg)  Height: 5\' 4"  (1.626 m)    Wt Readings from Last 3 Encounters:  07/13/21 112 lb 9.6 oz (51.1 kg)  07/01/21 113 lb (51.3 kg)  06/14/21 111 lb 6.4 oz (50.5 kg)    Allergies as of 07/13/2021       Reactions   Aspirin Nausea And Vomiting, Other (See  Comments)   Pt states aspirin makes her cramp and have to use coated kind.   Tramadol    Zyrtec [cetirizine]         Medication List        Accurate as of July 13, 2021 12:15 PM. If you have any questions, ask your nurse or doctor.          albuterol 108 (90 Base) MCG/ACT inhaler Commonly  known as: VENTOLIN HFA Inhale 2 puffs into the lungs every 6 (six) hours as needed for wheezing or shortness of breath.   aspirin 81 MG EC tablet Take 1 tablet (81 mg total) by mouth daily.   atorvastatin 80 MG tablet Commonly known as: LIPITOR Take 1 tablet (80 mg total) by mouth daily.   budesonide-formoterol 80-4.5 MCG/ACT inhaler Commonly known as: SYMBICORT Inhale 2 puffs into the lungs daily.   cholecalciferol 10 MCG (400 UNIT) Tabs tablet Commonly known as: VITAMIN D3 Take 400 Units by mouth.   ezetimibe 10 MG tablet Commonly known as: ZETIA Take 1 tablet (10 mg total) by mouth daily.   ferrous sulfate 325 (65 FE) MG tablet Take 325 mg by mouth daily.   isosorbide mononitrate 30 MG 24 hr tablet Commonly known as: IMDUR Take 1 tablet (30 mg total) by mouth daily.   losartan 50 MG tablet Commonly known as: COZAAR Take 1 tablet (50 mg total) by mouth daily.   methylPREDNISolone 4 MG Tbpk tablet Commonly known as: MEDROL DOSEPAK Medrol dosepak.  Please complete as per package directions   nicotine 21 mg/24hr patch Commonly known as: NICODERM CQ - dosed in mg/24 hours Place 1 patch (21 mg total) onto the skin daily.   Repatha 140 MG/ML Sosy Generic drug: Evolocumab Use as directed by cardiology         Physical Exam: Constitutional: Patient appears well-developed and well-nourished. Not in obvious distress. HENT: Normocephalic, atraumatic, External right and left ear normal. Oropharynx is clear and moist.  Eyes: Conjunctivae and EOM are normal. PERRLA, no scleral icterus. Neck: Normal ROM. Neck supple. No JVD. No tracheal deviation. No thyromegaly. CVS:  RRR, S1/S2 +, no murmurs, no gallops, no carotid bruit.  Pulmonary: Effort and breath sounds normal, no stridor, rhonchi, wheezes, rales.  Abdominal: Soft. BS +, no distension, tenderness, rebound or guarding.  Musculoskeletal: Normal range of motion. No edema and no tenderness.  Lymphadenopathy: No lymphadenopathy noted, cervical, inguinal or axillary Neuro: Alert. Normal reflexes, muscle tone coordination. No cranial nerve deficit. Skin: Skin is warm and dry. No rash noted. Not diaphoretic. No erythema. No pallor. Psychiatric: Normal mood and affect. Behavior, judgment, thought content normal.   Data Review   Micro Results No results found for this or any previous visit (from the past 240 hour(s)).   CBC No results for input(s): WBC, HGB, HCT, PLT, MCV, MCH, MCHC, RDW, LYMPHSABS, MONOABS, EOSABS, BASOSABS, BANDABS in the last 168 hours.  Invalid input(s): NEUTRABS, BANDSABD  Chemistries  No results for input(s): NA, K, CL, CO2, GLUCOSE, BUN, CREATININE, CALCIUM, MG, AST, ALT, ALKPHOS, BILITOT in the last 168 hours.  Invalid input(s): GFRCGP ------------------------------------------------------------------------------------------------------------------ estimated creatinine clearance is 56.6 mL/min (by C-G formula based on SCr of 0.67 mg/dL). ------------------------------------------------------------------------------------------------------------------ No results for input(s): HGBA1C in the last 72 hours. ------------------------------------------------------------------------------------------------------------------ No results for input(s): CHOL, HDL, LDLCALC, TRIG, CHOLHDL, LDLDIRECT in the last 72 hours. ------------------------------------------------------------------------------------------------------------------ No results for input(s): TSH, T4TOTAL, T3FREE, THYROIDAB in the last 72 hours.  Invalid input(s):  FREET3 ------------------------------------------------------------------------------------------------------------------ No results for input(s): VITAMINB12, FOLATE, FERRITIN, TIBC, IRON, RETICCTPCT in the last 72 hours.  Coagulation profile No results for input(s): INR, PROTIME in the last 168 hours.  No results for input(s): DDIMER in the last 72 hours.  Cardiac Enzymes No results for input(s): CKMB, TROPONINI, MYOGLOBIN in the last 168 hours.  Invalid input(s): CK ------------------------------------------------------------------------------------------------------------------ Invalid input(s): [place]  Return for previously scheduled, F/U, Rakel Junio PCP in january.  Time Spent in minutes  45 Time spent with patient included reviewing progress notes, labs, imaging studies, and discussing plan for follow up.   [place] Controlled Substance Database was reviewed by me for overdose risk score (ORS)  This patient was seen by Jonetta Osgood, FNP-C in collaboration with Dr. Clayborn Bigness as a part of collaborative care agreement.  Jonetta Osgood MSN, FNP-C on 07/13/2021 at 12:15 PM   **Disclaimer: This note may have been dictated with voice recognition software. Similar sounding words can inadvertently be transcribed and this note may contain transcription errors which may not have been corrected upon publication of note.**

## 2021-07-18 ENCOUNTER — Ambulatory Visit
Admission: RE | Admit: 2021-07-18 | Discharge: 2021-07-18 | Disposition: A | Discharge status: Home / Self Care | Payer: Medicare Other | Source: Ambulatory Visit | Attending: Nurse Practitioner | Admitting: Nurse Practitioner

## 2021-07-18 ENCOUNTER — Other Ambulatory Visit: Payer: Self-pay

## 2021-07-18 DIAGNOSIS — Z780 Asymptomatic menopausal state: Secondary | ICD-10-CM | POA: Insufficient documentation

## 2021-07-18 DIAGNOSIS — Z1231 Encounter for screening mammogram for malignant neoplasm of breast: Secondary | ICD-10-CM | POA: Insufficient documentation

## 2021-07-19 ENCOUNTER — Telehealth: Payer: Self-pay

## 2021-07-19 ENCOUNTER — Telehealth: Payer: Self-pay | Admitting: Student-PharmD
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
## 2021-07-19 NOTE — Progress Notes (Signed)
Please call patient and let her know that her mammogram was normal and negative for any malignancy. Also I reviewed her bone density scan and it was normal, there was no osteopenia or osteoporosis identified.

## 2021-07-19 NOTE — Progress Notes (Addendum)
   Chronic Care Management Pharmacy Assistant   Name: [name]  MRN: [id number] DOB: 23-Aug-1956  [name] is an 65 y.o. year old female who presents for his initial CCM visit with the clinical pharmacist.  Reason for Encounter: Chart Prep    Conditions to be addressed/monitored: HTN, CAD, HLD, Vitamin D deficiency   Primary concerns for visit include:  HTN  Recent office visits:  07/13/21 Erika Osgood, NP. For hospitalization follow-up. No medication changes.  05/19/21 Erika Osgood, NP. For medicare wellness. STOPPED Cetirizine.   Recent consult visits:  06/14/21 Vascular Surgery Erika Hartmann, NP. For follow-up. No medication changes.   Hospital visits:  07/01/21 [facility] (4 Days) Erika Ace, MD. For Influenza. No medication changes.   Medication History: Atorvastatin 80 mg 05/19/21 90 DS Losartan 50 mg 05/05/21 90 DS  Medications: Outpatient Encounter Medications as of 07/19/2021  Medication Sig   albuterol (VENTOLIN HFA) 108 (90 Base) MCG/ACT inhaler Inhale 2 puffs into the lungs every 6 (six) hours as needed for wheezing or shortness of breath.   aspirin EC 81 MG EC tablet Take 1 tablet (81 mg total) by mouth daily.   atorvastatin (LIPITOR) 80 MG tablet Take 1 tablet (80 mg total) by mouth daily.   budesonide-formoterol (SYMBICORT) 80-4.5 MCG/ACT inhaler Inhale 2 puffs into the lungs daily.   cholecalciferol (VITAMIN D) 400 units TABS tablet Take 400 Units by mouth.   Evolocumab (REPATHA) 140 MG/ML SOSY Use as directed by cardiology   ezetimibe (ZETIA) 10 MG tablet Take 1 tablet (10 mg total) by mouth daily.   ferrous sulfate 325 (65 FE) MG tablet Take 325 mg by mouth daily.   isosorbide mononitrate (IMDUR) 30 MG 24 hr tablet Take 1 tablet (30 mg total) by mouth daily.   losartan (COZAAR) 50 MG tablet Take 1 tablet (50 mg total) by mouth daily.   methylPREDNISolone (MEDROL DOSEPAK) 4 MG TBPK tablet Medrol dosepak.   Please complete as per package directions   nicotine (NICODERM CQ - DOSED IN MG/24 HOURS) 21 mg/24hr patch Place 1 patch (21 mg total) onto the skin daily.   No facility-administered encounter medications on file as of 07/19/2021.   Have you seen any other providers since your last visit? Patient stated no.  Any changes in your medications or health? Patient stated no.   Any side effects from any medications? Patient stated no.  Do you have an symptoms or problems not managed by your medications? Patient stated no.  Any concerns about your health right now? Patient stated no.  Has your provider asked that you check blood pressure, blood sugar, or follow special diet at home? Patient stated no.  Do you get any type of exercise on a regular basis? Patient stated she walks sometimes.   Can you think of a goal you would like to reach for your health? Patient stated she would like to gain weight.   Do you have any problems getting your medications? Patient stated no.  Is there anything that you would like to discuss during the appointment? Patient stated no.  Please bring medications and supplements to appointment, patient reminded of her appointment on 07/20/21 at 130 pm.  Follow-Up:Pharmacist Review  [name], [place] Pharmacist Assistant [phone]  5 minutes spent in review, coordination, and documentation.  Reviewed by: [name], PharmD Clinical Pharmacist [phone]

## 2021-07-19 NOTE — Telephone Encounter (Signed)
-----   Message from Jonetta Osgood, NP sent at 07/19/2021  5:53 AM EST ----- Please call patient and let her know that her mammogram was normal and negative for any malignancy. Also I reviewed her bone density scan and it was normal, there was no osteopenia or osteoporosis identified.

## 2021-07-20 ENCOUNTER — Ambulatory Visit: Payer: Medicare Other | Admitting: Student-PharmD

## 2021-07-20 ENCOUNTER — Other Ambulatory Visit: Payer: Self-pay

## 2021-07-20 DIAGNOSIS — E559 Vitamin D deficiency, unspecified: Secondary | ICD-10-CM

## 2021-07-20 DIAGNOSIS — J449 Chronic obstructive pulmonary disease, unspecified: Secondary | ICD-10-CM

## 2021-07-20 DIAGNOSIS — E782 Mixed hyperlipidemia: Secondary | ICD-10-CM

## 2021-07-20 DIAGNOSIS — I10 Essential (primary) hypertension: Secondary | ICD-10-CM

## 2021-07-20 NOTE — Patient Instructions (Addendum)
Visit Information   Goals Addressed             This Visit's Progress    Manage My Medicine   On track    Timeframe:  Long-Range Goal Priority:  High Start Date:  07/20/2021                           Expected End Date:    07/20/2022                   Follow Up Date 12/2021    - call for medicine refill 2 or 3 days before it runs out - keep a list of all the medicines I take; vitamins and herbals too - use an alarm clock or phone to remind me to take my medicine    Why is this important?   These steps will help you keep on track with your medicines.   Notes:      Track and Manage My Blood Pressure-Hypertension   On track    Timeframe:  Long-Range Goal Priority:  High Start Date:  07/20/2021                           Expected End Date:  07/20/2022                     Follow Up Date 12/2021    - check blood pressure weekly - write blood pressure results in a log or diary    Why is this important?   You won't feel high blood pressure, but it can still hurt your blood vessels.  High blood pressure can cause heart or kidney problems. It can also cause a stroke.  Making lifestyle changes like losing a little weight or eating less salt will help.  Checking your blood pressure at home and at different times of the day can help to control blood pressure.  If the doctor prescribes medicine remember to take it the way the doctor ordered.  Call the office if you cannot afford the medicine or if there are questions about it.     Notes:        Patient Care Plan: CCM Pharmacy Care Plan     Problem Identified: HTN, HLD, COPD, Vit D Deficiency   Priority: High     Long-Range Goal: Disease Management   Start Date: 07/20/2021  Expected End Date: 07/20/2022  This Visit's Progress: On track  Priority: High  Note:   Current Barriers:  Does not adhere to prescribed medication regimen  Pharmacist Clinical Goal(s):  Patient will achieve adherence to monitoring guidelines and  medication adherence to achieve therapeutic efficacy through collaboration with PharmD and provider.   Interventions: 1:1 collaboration with Lavera Guise, MD regarding development and update of comprehensive plan of care as evidenced by provider attestation and co-signature Inter-disciplinary care team collaboration (see longitudinal plan of care) Comprehensive medication review performed; medication list updated in electronic medical record  Hypertension (BP goal <130/80) -Uncontrolled -Current treatment: Isosorbide Mononitrate ER 60mg  daily Losartan 50mg  daily -Medications previously tried: Metoprolol, Olmesartan  -Current home readings: does not take readings at home -Current dietary habits: has dental issues, relies on ensure and soft foods such as mashed potatoes for nutrition -Current exercise habits: none -Denies hypotensive/hypertensive symptoms -Educated on Daily salt intake goal < 2300 mg; Importance of home blood pressure monitoring; -Cardiology increased Isosorbide to 60mg  -Counseled  to monitor BP at home at least once weekly, document, and provide log at future appointments -Recommended to continue current medication  Hyperlipidemia: (LDL goal < 70) -Not ideally controlled -Current treatment: Atorvastatin 80mg  daily Ezetimibe 10mg  daily Repatha -Medications previously tried: None noted  -Current dietary patterns: see above -Current exercise habits: see above -Educated on Cholesterol goals;  Benefits of statin for ASCVD risk reduction; -Recommended to continue current medication Counseled on adherence to repatha injection  COPD (Goal: control symptoms and prevent exacerbations) -Not ideally controlled -Current treatment  Symbicort 80-4.45mcg: 2 puffs twice daily Ventolin HFA as needed -Medications previously tried: none noted  -Exacerbations requiring treatment in last 6 months: 1 -Patient denies consistent use of maintenance inhaler -Frequency of rescue  inhaler use: occasional in middle of night -Counseled on Benefits of consistent maintenance inhaler use When to use rescue inhaler Differences between maintenance and rescue inhalers -Recommended to continue current medication  Tobacco use (Goal quit smoking) -Not ideally controlled -Previous quit attempts: 0 -Current treatment  Nicotine 21mg /24 hr patch prescribed, not picked up -On a scale of 1-10, reports MOTIVATION to quit is 0 -Provided contact information for [place] [facility]uit Line (1-800-QUIT-NOW) and encouraged patient to reach out to this group for support. -Recommended patient contact pharmacy to get patches filled  Vit D Deficiency (Goal: Vit D WNL) -Not ideally controlled -Current treatment  Vitamin D 1000 units daily -Medications previously tried: none noted  -Recommended to continue current medication Recommended patient get Vit D checked at next appt  Patient Goals/Self-Care Activities Patient will:  - take medications as prescribed as evidenced by patient report and record review check blood pressure once weekly, document, and provide at future appointments  Follow Up Plan: Telephone follow up appointment with care management team member scheduled for: 23months     Ms. Ehrsam was given information about Chronic Care Management services today including:  CCM service includes personalized support from designated clinical staff supervised by her physician, including individualized plan of care and coordination with other care providers 24/7 contact phone numbers for assistance for urgent and routine care needs. Standard insurance, coinsurance, copays and deductibles apply for chronic care management only during months in which we provide at least 20 minutes of these services. Most insurances cover these services at 100%, however patients may be responsible for any copay, coinsurance and/or deductible if applicable. This service may help you avoid the need for more expensive  face-to-face services. Only one practitioner may furnish and bill the service in a calendar month. The patient may stop CCM services at any time (effective at the end of the month) by phone call to the office staff.  Patient agreed to services and verbal consent obtained.   The patient verbalized understanding of instructions, educational materials, and care plan provided today and agreed to receive a mailed copy of patient instructions, educational materials, and care plan.  Telephone follow up appointment with pharmacy team member scheduled for:4 months  Alena Bills, [facility]  Clinical Pharmacist [phone]

## 2021-07-26 ENCOUNTER — Telehealth: Payer: Self-pay | Admitting: Student-PharmD

## 2021-07-26 NOTE — Progress Notes (Signed)
  Chronic Care Management Pharmacy Assistant   Name: [name]  MRN: [id number] DOB: [birth year]  Reason for Encounter: CCM Care Plan  Medications: Outpatient Encounter Medications as of 07/26/2021  Medication Sig Note   albuterol (VENTOLIN HFA) 108 (90 Base) MCG/ACT inhaler Inhale 2 puffs into the lungs every 6 (six) hours as needed for wheezing or shortness of breath.    aspirin EC 81 MG EC tablet Take 1 tablet (81 mg total) by mouth daily.    atorvastatin (LIPITOR) 80 MG tablet Take 1 tablet (80 mg total) by mouth daily.    budesonide-formoterol (SYMBICORT) 80-4.5 MCG/ACT inhaler Inhale 2 puffs into the lungs daily.    cholecalciferol (VITAMIN D) 400 units TABS tablet Take 400 Units by mouth.    Evolocumab (REPATHA) 140 MG/ML SOSY Use as directed by cardiology    ezetimibe (ZETIA) 10 MG tablet Take 1 tablet (10 mg total) by mouth daily.    ferrous sulfate 325 (65 FE) MG tablet Take 325 mg by mouth daily.    isosorbide mononitrate (IMDUR) 30 MG 24 hr tablet Take 1 tablet (30 mg total) by mouth daily. (Patient not taking: Reported on 07/20/2021) 07/20/2021: Patient taking 60mg  per cardiologist. Pharmacy has up to date script.   losartan (COZAAR) 50 MG tablet Take 1 tablet (50 mg total) by mouth daily.    methylPREDNISolone (MEDROL DOSEPAK) 4 MG TBPK tablet Medrol dosepak.  Please complete as per package directions    nicotine (NICODERM CQ - DOSED IN MG/24 HOURS) 21 mg/24hr patch Place 1 patch (21 mg total) onto the skin daily. (Patient not taking: Reported on 07/20/2021)    No facility-administered encounter medications on file as of 07/26/2021.   Reviewed the patients initial visit reinsured it was completed per the pharmacist Alena Bills request. Printed the CCM care plan. Mailed the patient CCM care plan to their most recent address on file.   Follow-Up:Pharmacist Review  Charlann Lange, [place] Pharmacist Assistant [phone]

## 2021-08-02 DIAGNOSIS — I10 Essential (primary) hypertension: Secondary | ICD-10-CM

## 2021-08-02 DIAGNOSIS — J441 Chronic obstructive pulmonary disease with (acute) exacerbation: Secondary | ICD-10-CM

## 2021-08-05 ENCOUNTER — Inpatient Hospital Stay
Admission: EM | Admit: 2021-08-05 | Discharge: 2021-08-10 | DRG: 871 | Disposition: A | Discharge status: Home Health | Payer: Medicare Other | Attending: Internal Medicine | Admitting: Internal Medicine

## 2021-08-05 ENCOUNTER — Emergency Department: Payer: Medicare Other

## 2021-08-05 ENCOUNTER — Other Ambulatory Visit: Payer: Self-pay

## 2021-08-05 DIAGNOSIS — D509 Iron deficiency anemia, unspecified: Secondary | ICD-10-CM | POA: Diagnosis not present

## 2021-08-05 DIAGNOSIS — Z8639 Personal history of other endocrine, nutritional and metabolic disease: Secondary | ICD-10-CM | POA: Diagnosis not present

## 2021-08-05 DIAGNOSIS — Y95 Nosocomial condition: Secondary | ICD-10-CM | POA: Diagnosis present

## 2021-08-05 DIAGNOSIS — N179 Acute kidney failure, unspecified: Secondary | ICD-10-CM | POA: Diagnosis present

## 2021-08-05 DIAGNOSIS — I700 Atherosclerosis of aorta: Secondary | ICD-10-CM | POA: Diagnosis present

## 2021-08-05 DIAGNOSIS — I2510 Atherosclerotic heart disease of native coronary artery without angina pectoris: Secondary | ICD-10-CM | POA: Diagnosis present

## 2021-08-05 DIAGNOSIS — Z90710 Acquired absence of both cervix and uterus: Secondary | ICD-10-CM | POA: Diagnosis not present

## 2021-08-05 DIAGNOSIS — A419 Sepsis, unspecified organism: Secondary | ICD-10-CM | POA: Diagnosis not present

## 2021-08-05 DIAGNOSIS — R7989 Other specified abnormal findings of blood chemistry: Secondary | ICD-10-CM | POA: Diagnosis present

## 2021-08-05 DIAGNOSIS — I252 Old myocardial infarction: Secondary | ICD-10-CM | POA: Diagnosis not present

## 2021-08-05 DIAGNOSIS — Z8249 Family history of ischemic heart disease and other diseases of the circulatory system: Secondary | ICD-10-CM

## 2021-08-05 DIAGNOSIS — D696 Thrombocytopenia, unspecified: Secondary | ICD-10-CM | POA: Diagnosis present

## 2021-08-05 DIAGNOSIS — Z9049 Acquired absence of other specified parts of digestive tract: Secondary | ICD-10-CM | POA: Diagnosis not present

## 2021-08-05 DIAGNOSIS — J440 Chronic obstructive pulmonary disease with acute lower respiratory infection: Secondary | ICD-10-CM | POA: Diagnosis present

## 2021-08-05 DIAGNOSIS — J441 Chronic obstructive pulmonary disease with (acute) exacerbation: Secondary | ICD-10-CM | POA: Diagnosis not present

## 2021-08-05 DIAGNOSIS — E782 Mixed hyperlipidemia: Secondary | ICD-10-CM | POA: Diagnosis present

## 2021-08-05 DIAGNOSIS — Z885 Allergy status to narcotic agent status: Secondary | ICD-10-CM

## 2021-08-05 DIAGNOSIS — J439 Emphysema, unspecified: Secondary | ICD-10-CM | POA: Diagnosis not present

## 2021-08-05 DIAGNOSIS — J13 Pneumonia due to Streptococcus pneumoniae: Secondary | ICD-10-CM | POA: Diagnosis present

## 2021-08-05 DIAGNOSIS — Z79899 Other long term (current) drug therapy: Secondary | ICD-10-CM

## 2021-08-05 DIAGNOSIS — R6889 Other general symptoms and signs: Secondary | ICD-10-CM | POA: Diagnosis not present

## 2021-08-05 DIAGNOSIS — Z0001 Encounter for general adult medical examination with abnormal findings: Secondary | ICD-10-CM

## 2021-08-05 DIAGNOSIS — I10 Essential (primary) hypertension: Secondary | ICD-10-CM | POA: Diagnosis present

## 2021-08-05 DIAGNOSIS — I499 Cardiac arrhythmia, unspecified: Secondary | ICD-10-CM | POA: Diagnosis not present

## 2021-08-05 DIAGNOSIS — R778 Other specified abnormalities of plasma proteins: Secondary | ICD-10-CM | POA: Diagnosis not present

## 2021-08-05 DIAGNOSIS — I248 Other forms of acute ischemic heart disease: Secondary | ICD-10-CM | POA: Diagnosis not present

## 2021-08-05 DIAGNOSIS — E876 Hypokalemia: Secondary | ICD-10-CM | POA: Diagnosis present

## 2021-08-05 DIAGNOSIS — I739 Peripheral vascular disease, unspecified: Secondary | ICD-10-CM | POA: Diagnosis not present

## 2021-08-05 DIAGNOSIS — J189 Pneumonia, unspecified organism: Secondary | ICD-10-CM | POA: Diagnosis present

## 2021-08-05 DIAGNOSIS — R6520 Severe sepsis without septic shock: Secondary | ICD-10-CM | POA: Diagnosis present

## 2021-08-05 DIAGNOSIS — R6521 Severe sepsis with septic shock: Secondary | ICD-10-CM | POA: Diagnosis not present

## 2021-08-05 DIAGNOSIS — A403 Sepsis due to Streptococcus pneumoniae: Secondary | ICD-10-CM | POA: Diagnosis not present

## 2021-08-05 DIAGNOSIS — R197 Diarrhea, unspecified: Secondary | ICD-10-CM | POA: Diagnosis present

## 2021-08-05 DIAGNOSIS — F17210 Nicotine dependence, cigarettes, uncomplicated: Secondary | ICD-10-CM | POA: Diagnosis not present

## 2021-08-05 DIAGNOSIS — E7800 Pure hypercholesterolemia, unspecified: Secondary | ICD-10-CM | POA: Diagnosis not present

## 2021-08-05 DIAGNOSIS — I2583 Coronary atherosclerosis due to lipid rich plaque: Secondary | ICD-10-CM | POA: Diagnosis present

## 2021-08-05 DIAGNOSIS — Z886 Allergy status to analgesic agent status: Secondary | ICD-10-CM

## 2021-08-05 DIAGNOSIS — J449 Chronic obstructive pulmonary disease, unspecified: Secondary | ICD-10-CM | POA: Diagnosis present

## 2021-08-05 DIAGNOSIS — Z7951 Long term (current) use of inhaled steroids: Secondary | ICD-10-CM

## 2021-08-05 DIAGNOSIS — Z7982 Long term (current) use of aspirin: Secondary | ICD-10-CM

## 2021-08-05 DIAGNOSIS — R404 Transient alteration of awareness: Secondary | ICD-10-CM | POA: Diagnosis not present

## 2021-08-05 DIAGNOSIS — F1010 Alcohol abuse, uncomplicated: Secondary | ICD-10-CM | POA: Diagnosis present

## 2021-08-05 DIAGNOSIS — R9431 Abnormal electrocardiogram [ECG] [EKG]: Secondary | ICD-10-CM | POA: Diagnosis not present

## 2021-08-05 DIAGNOSIS — Z716 Tobacco abuse counseling: Secondary | ICD-10-CM

## 2021-08-05 DIAGNOSIS — Z743 Need for continuous supervision: Secondary | ICD-10-CM | POA: Diagnosis not present

## 2021-08-05 DIAGNOSIS — Z20822 Contact with and (suspected) exposure to covid-19: Secondary | ICD-10-CM | POA: Diagnosis not present

## 2021-08-05 DIAGNOSIS — R0902 Hypoxemia: Secondary | ICD-10-CM | POA: Diagnosis not present

## 2021-08-05 DIAGNOSIS — F17200 Nicotine dependence, unspecified, uncomplicated: Secondary | ICD-10-CM | POA: Diagnosis present

## 2021-08-05 DIAGNOSIS — Z888 Allergy status to other drugs, medicaments and biological substances status: Secondary | ICD-10-CM

## 2021-08-05 LAB — COMPREHENSIVE METABOLIC PANEL
ALT: 17 U/L (ref 0–44)
AST: 27 U/L (ref 15–41)
Albumin: 3.2 g/dL — ABNORMAL LOW (ref 3.5–5.0)
Alkaline Phosphatase: 49 U/L (ref 38–126)
Anion gap: 10 (ref 5–15)
BUN: 40 mg/dL — ABNORMAL HIGH (ref 8–23)
CO2: 21 mmol/L — ABNORMAL LOW (ref 22–32)
Calcium: 8.7 mg/dL — ABNORMAL LOW (ref 8.9–10.3)
Chloride: 102 mmol/L (ref 98–111)
Creatinine, Ser: 1.91 mg/dL — ABNORMAL HIGH (ref 0.44–1.00)
GFR, Estimated: 29 mL/min — ABNORMAL LOW (ref >60)
Glucose, Bld: 116 mg/dL — ABNORMAL HIGH (ref 70–99)
Potassium: 3.2 mmol/L — ABNORMAL LOW (ref 3.5–5.1)
Sodium: 133 mmol/L — ABNORMAL LOW (ref 135–145)
Total Bilirubin: 1.1 mg/dL (ref 0.3–1.2)
Total Protein: 6.9 g/dL (ref 6.5–8.1)

## 2021-08-05 LAB — CBC WITH DIFFERENTIAL/PLATELET
Abs Immature Granulocytes: 0.41 10*3/uL — ABNORMAL HIGH (ref 0.00–0.07)
Basophils Absolute: 0.1 10*3/uL (ref 0.0–0.1)
Basophils Relative: 0 %
Eosinophils Absolute: 0.2 10*3/uL (ref 0.0–0.5)
Eosinophils Relative: 1 %
HCT: 36.5 % (ref 36.0–46.0)
Hemoglobin: 12 g/dL (ref 12.0–15.0)
Immature Granulocytes: 3 %
Lymphocytes Relative: 4 %
Lymphs Abs: 0.6 10*3/uL — ABNORMAL LOW (ref 0.7–4.0)
MCH: 32.4 pg (ref 26.0–34.0)
MCHC: 32.9 g/dL (ref 30.0–36.0)
MCV: 98.6 fL (ref 80.0–100.0)
Monocytes Absolute: 0.3 10*3/uL (ref 0.1–1.0)
Monocytes Relative: 2 %
Neutro Abs: 12.4 10*3/uL — ABNORMAL HIGH (ref 1.7–7.7)
Neutrophils Relative %: 90 %
Platelets: 114 10*3/uL — ABNORMAL LOW (ref 150–400)
RBC: 3.7 MIL/uL — ABNORMAL LOW (ref 3.87–5.11)
RDW: 14.4 % (ref 11.5–15.5)
WBC: 14 10*3/uL — ABNORMAL HIGH (ref 4.0–10.5)
nRBC: 0 % (ref 0.0–0.2)

## 2021-08-05 LAB — LACTIC ACID, PLASMA
Lactic Acid, Venous: 2 mmol/L (ref 0.5–1.9)
Lactic Acid, Venous: 1.5 mmol/L (ref 0.5–1.9)

## 2021-08-05 LAB — URINALYSIS, COMPLETE (UACMP) WITH MICROSCOPIC
Bacteria, UA: NONE SEEN
Bilirubin Urine: NEGATIVE
Glucose, UA: NEGATIVE mg/dL
Ketones, ur: NEGATIVE mg/dL
Nitrite: NEGATIVE
Protein, ur: 100 mg/dL — AB
Specific Gravity, Urine: 1.02 (ref 1.005–1.030)
pH: 5.5 (ref 5.0–8.0)

## 2021-08-05 LAB — PHOSPHORUS: Phosphorus: 3.6 mg/dL (ref 2.5–4.6)

## 2021-08-05 LAB — BRAIN NATRIURETIC PEPTIDE: B Natriuretic Peptide: 178.6 pg/mL — ABNORMAL HIGH (ref 0.0–100.0)

## 2021-08-05 LAB — TROPONIN I (HIGH SENSITIVITY)
Troponin I (High Sensitivity): 20 ng/L — ABNORMAL HIGH (ref <18)
Troponin I (High Sensitivity): 21 ng/L — ABNORMAL HIGH (ref <18)
Troponin I (High Sensitivity): 22 ng/L — ABNORMAL HIGH (ref <18)

## 2021-08-05 LAB — PROTIME-INR
INR: 1.1 (ref 0.8–1.2)
Prothrombin Time: 13.8 seconds (ref 11.4–15.2)

## 2021-08-05 LAB — PROCALCITONIN: Procalcitonin: 60.68 ng/mL

## 2021-08-05 LAB — APTT: aPTT: 42 seconds — ABNORMAL HIGH (ref 24–36)

## 2021-08-05 LAB — STREP PNEUMONIAE URINARY ANTIGEN: Strep Pneumo Urinary Antigen: NEGATIVE

## 2021-08-05 LAB — RESP PANEL BY RT-PCR (FLU A&B, COVID) ARPGX2
Influenza A by PCR: NEGATIVE
Influenza B by PCR: NEGATIVE
SARS Coronavirus 2 by RT PCR: NEGATIVE

## 2021-08-05 LAB — MAGNESIUM: Magnesium: 1.5 mg/dL — ABNORMAL LOW (ref 1.7–2.4)

## 2021-08-05 MED ORDER — FERROUS SULFATE 325 (65 FE) MG PO TABS
325.0000 mg | ORAL_TABLET | Freq: Every day | ORAL | Status: DC
  Administered 2021-08-06 – 2021-08-10 (×5): 325 mg via ORAL
  Filled 2021-08-05 (×5): qty 1

## 2021-08-05 MED ORDER — ONDANSETRON HCL 4 MG/2ML IJ SOLN
4.0000 mg | Freq: Three times a day (TID) | INTRAMUSCULAR | Status: DC | PRN
  Administered 2021-08-10: 4 mg via INTRAVENOUS
  Filled 2021-08-05: qty 2

## 2021-08-05 MED ORDER — LORAZEPAM 2 MG/ML IJ SOLN
0.0000 mg | Freq: Four times a day (QID) | INTRAMUSCULAR | Status: DC
  Administered 2021-08-07 (×2): 2 mg via INTRAVENOUS
  Filled 2021-08-05 (×3): qty 1

## 2021-08-05 MED ORDER — THIAMINE HCL 100 MG/ML IJ SOLN
100.0000 mg | Freq: Every day | INTRAMUSCULAR | Status: DC
  Filled 2021-08-05: qty 2

## 2021-08-05 MED ORDER — POTASSIUM CHLORIDE CRYS ER 20 MEQ PO TBCR
40.0000 meq | EXTENDED_RELEASE_TABLET | Freq: Once | ORAL | Status: DC
  Filled 2021-08-05: qty 2

## 2021-08-05 MED ORDER — LORAZEPAM 2 MG/ML IJ SOLN: 0.0000 mg | Freq: Two times a day (BID) | INTRAMUSCULAR | Status: DC

## 2021-08-05 MED ORDER — CHOLECALCIFEROL 10 MCG (400 UNIT) PO TABS
400.0000 [IU] | ORAL_TABLET | Freq: Every day | ORAL | Status: DC
  Administered 2021-08-06: 11:00:00 400 [IU] via ORAL
  Filled 2021-08-05 (×3): qty 1

## 2021-08-05 MED ORDER — VANCOMYCIN HCL IN DEXTROSE 1-5 GM/200ML-% IV SOLN
1000.0000 mg | Freq: Once | INTRAVENOUS | Status: AC
  Administered 2021-08-05: 1000 mg via INTRAVENOUS
  Filled 2021-08-05: qty 200

## 2021-08-05 MED ORDER — SODIUM CHLORIDE 0.9 % IV SOLN
500.0000 mg | INTRAVENOUS | Status: DC
  Administered 2021-08-05: 500 mg via INTRAVENOUS
  Filled 2021-08-05: qty 500

## 2021-08-05 MED ORDER — SODIUM CHLORIDE 0.9 % IV BOLUS (SEPSIS)
1000.0000 mL | Freq: Once | INTRAVENOUS | Status: AC
  Administered 2021-08-05: 1000 mL via INTRAVENOUS

## 2021-08-05 MED ORDER — ENOXAPARIN SODIUM 30 MG/0.3ML IJ SOSY
30.0000 mg | PREFILLED_SYRINGE | INTRAMUSCULAR | Status: DC
  Administered 2021-08-05 – 2021-08-06 (×2): 30 mg via SUBCUTANEOUS
  Filled 2021-08-05 (×2): qty 0.3

## 2021-08-05 MED ORDER — LACTATED RINGERS IV BOLUS (SEPSIS)
1000.0000 mL | Freq: Once | INTRAVENOUS | Status: AC
  Administered 2021-08-05: 1000 mL via INTRAVENOUS

## 2021-08-05 MED ORDER — ASPIRIN EC 81 MG PO TBEC
81.0000 mg | DELAYED_RELEASE_TABLET | Freq: Every day | ORAL | Status: DC
  Administered 2021-08-06 – 2021-08-10 (×5): 81 mg via ORAL
  Filled 2021-08-05 (×5): qty 1

## 2021-08-05 MED ORDER — LACTATED RINGERS IV SOLN: INTRAVENOUS | Status: AC

## 2021-08-05 MED ORDER — IPRATROPIUM-ALBUTEROL 0.5-2.5 (3) MG/3ML IN SOLN
3.0000 mL | Freq: Four times a day (QID) | RESPIRATORY_TRACT | Status: DC
  Administered 2021-08-06 (×3): 3 mL via RESPIRATORY_TRACT
  Filled 2021-08-05 (×4): qty 3

## 2021-08-05 MED ORDER — LORAZEPAM 2 MG/ML IJ SOLN
1.0000 mg | INTRAMUSCULAR | Status: DC | PRN
  Administered 2021-08-06: 21:00:00 2 mg via INTRAVENOUS
  Filled 2021-08-05: qty 1

## 2021-08-05 MED ORDER — SODIUM CHLORIDE 0.9 % IV SOLN
2.0000 g | INTRAVENOUS | Status: DC
  Administered 2021-08-05: 2 g via INTRAVENOUS
  Filled 2021-08-05: qty 20

## 2021-08-05 MED ORDER — NICOTINE 21 MG/24HR TD PT24
21.0000 mg | MEDICATED_PATCH | Freq: Every day | TRANSDERMAL | Status: DC
  Administered 2021-08-06 – 2021-08-10 (×5): 21 mg via TRANSDERMAL
  Filled 2021-08-05 (×6): qty 1

## 2021-08-05 MED ORDER — DM-GUAIFENESIN ER 30-600 MG PO TB12
1.0000 | ORAL_TABLET | Freq: Two times a day (BID) | ORAL | Status: DC | PRN
  Administered 2021-08-06 – 2021-08-07 (×2): 1 via ORAL
  Filled 2021-08-05 (×5): qty 1

## 2021-08-05 MED ORDER — VANCOMYCIN VARIABLE DOSE PER UNSTABLE RENAL FUNCTION (PHARMACIST DOSING): Status: DC

## 2021-08-05 MED ORDER — EZETIMIBE 10 MG PO TABS
10.0000 mg | ORAL_TABLET | Freq: Every day | ORAL | Status: DC
  Administered 2021-08-06 – 2021-08-10 (×5): 10 mg via ORAL
  Filled 2021-08-05 (×5): qty 1

## 2021-08-05 MED ORDER — ALBUTEROL SULFATE (2.5 MG/3ML) 0.083% IN NEBU: 2.5000 mg | INHALATION_SOLUTION | RESPIRATORY_TRACT | Status: DC | PRN

## 2021-08-05 MED ORDER — SODIUM CHLORIDE 0.9 % IV SOLN
2.0000 g | Freq: Once | INTRAVENOUS | Status: AC
  Administered 2021-08-05: 2 g via INTRAVENOUS
  Filled 2021-08-05: qty 2

## 2021-08-05 MED ORDER — FOLIC ACID 1 MG PO TABS
1.0000 mg | ORAL_TABLET | Freq: Every day | ORAL | Status: DC
  Administered 2021-08-06 – 2021-08-10 (×5): 1 mg via ORAL
  Filled 2021-08-05 (×5): qty 1

## 2021-08-05 MED ORDER — LACTATED RINGERS IV BOLUS (SEPSIS)
500.0000 mL | Freq: Once | INTRAVENOUS | Status: AC
  Administered 2021-08-05: 500 mL via INTRAVENOUS

## 2021-08-05 MED ORDER — METHYLPREDNISOLONE SODIUM SUCC 40 MG IJ SOLR
40.0000 mg | Freq: Two times a day (BID) | INTRAMUSCULAR | Status: DC
  Administered 2021-08-06 – 2021-08-07 (×3): 40 mg via INTRAVENOUS
  Filled 2021-08-05 (×3): qty 1

## 2021-08-05 MED ORDER — ACETAMINOPHEN 325 MG PO TABS: 650.0000 mg | ORAL_TABLET | Freq: Four times a day (QID) | ORAL | Status: DC | PRN

## 2021-08-05 MED ORDER — THIAMINE HCL 100 MG PO TABS
100.0000 mg | ORAL_TABLET | Freq: Every day | ORAL | Status: DC
  Administered 2021-08-06 – 2021-08-10 (×5): 100 mg via ORAL
  Filled 2021-08-05 (×6): qty 1

## 2021-08-05 MED ORDER — LACTATED RINGERS IV BOLUS (SEPSIS)
250.0000 mL | Freq: Once | INTRAVENOUS | Status: AC
  Administered 2021-08-05: 250 mL via INTRAVENOUS

## 2021-08-05 MED ORDER — ATORVASTATIN CALCIUM 80 MG PO TABS
80.0000 mg | ORAL_TABLET | Freq: Every day | ORAL | Status: DC
  Administered 2021-08-05 – 2021-08-10 (×6): 80 mg via ORAL
  Filled 2021-08-05 (×6): qty 1

## 2021-08-05 MED ORDER — LORAZEPAM 1 MG PO TABS
1.0000 mg | ORAL_TABLET | ORAL | Status: DC | PRN
  Administered 2021-08-06: 1 mg via ORAL
  Filled 2021-08-05: qty 1

## 2021-08-05 MED ORDER — ADULT MULTIVITAMIN W/MINERALS CH
1.0000 | ORAL_TABLET | Freq: Every day | ORAL | Status: DC
  Administered 2021-08-06 – 2021-08-10 (×5): 1 via ORAL
  Filled 2021-08-05 (×5): qty 1

## 2021-08-05 MED ORDER — IPRATROPIUM-ALBUTEROL 0.5-2.5 (3) MG/3ML IN SOLN
3.0000 mL | RESPIRATORY_TRACT | Status: DC
  Administered 2021-08-05: 3 mL via RESPIRATORY_TRACT
  Filled 2021-08-05: qty 3

## 2021-08-05 MED ORDER — MOMETASONE FURO-FORMOTEROL FUM 100-5 MCG/ACT IN AERO
2.0000 | INHALATION_SPRAY | Freq: Two times a day (BID) | RESPIRATORY_TRACT | Status: DC
  Administered 2021-08-05 – 2021-08-10 (×10): 2 via RESPIRATORY_TRACT
  Filled 2021-08-05 (×2): qty 8.8

## 2021-08-05 NOTE — Sepsis Progress Note (Signed)
Sepsis protocol is being monitored by [employer]. 
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
## 2021-08-05 NOTE — H&P (Signed)
History and Physical    [name] FXT:[id number] DOB: [birth year] DOA: 08/05/2021  Referring MD/NP/PA:   PCP: Lavera Guise, MD   Patient coming from:  The patient is coming from home.    Chief Complaint: Lightheadedness and dizziness, cough, SOB and diarrhea  HPI: [name] is a 65 y.o. female with medical history significant of hypertension, hyperlipidemia, COPD, asthma, tobacco abuse, alcohol abuse, PVD, CAD, myocardial infarction, carotid artery stenosis, abscess of lung, GI bleeding, iron deficiency anemia, who presents with lightheadedness and dizziness, cough, shortness breath, diarrhea.  Patient was recently hospitalized from 10/29 -11/2 due to COPD exacerbation and Flu A infection.  Patient states that she has cough with little mucus production and shortness of breath in the past several days.  No chest pain.  Denies fever or chills.  She also reports diarrhea.  She has had at least 4 episodes of watery diarrhea today.  She has nausea, no vomiting or abdominal pain.  No symptoms of UTI. Patient states he has been feeding lightheaded and dizzy in the past several days. No fall.   Per report, patient was found to have hypotension with blood pressure 46/30, and then 66/44 after given 500 of IVF. Pt was given total of 2.75 L of IVF boluts in ED, Bp improved to 120/68. Her oxygen saturation was 92% on room air, which improved to 94-96% on 2 L oxygen.   ED Course: pt was found to have WBC 14.0, pending COVID PCR, pending lactic acid level, INR 1.1, PTT 42.4 appointment pending, potassium 3.2, AKI with creatinine 1.91 and BUN 40 (recent creatinine 0.67 on 07/03/2021), temperature normal, tachycardia with heart rate 100, RR 23, chest x-ray showed right middle lobe patchy infiltration.  Patient is admitted to progressive bed as inpatient.  Review of Systems:   General: no fevers, chills, no body weight gain, has poor appetite, has fatigue HEENT: no blurry vision, hearing changes or  sore throat Respiratory: has dyspnea, coughing, no wheezing CV: no chest pain, no palpitations GI: has nausea, diarrhea, no constipation, vomiting, abdominal pain, GU: no dysuria, burning on urination, increased urinary frequency, hematuria  Ext: no leg edema Neuro: no unilateral weakness, numbness, or tingling, no vision change or hearing loss Skin: no rash, no skin tear. MSK: No muscle spasm, no deformity, no limitation of range of movement in spin Heme: No easy bruising.  Travel history: No recent long distant travel.  Allergy:  Allergies  Allergen Reactions   Aspirin Nausea And Vomiting and Other (See Comments)    Pt states aspirin makes her cramp and have to use coated kind.   Tramadol    Zyrtec [Cetirizine]     Past Medical History:  Diagnosis Date   Allergy    Environmental   Arthritis    Asthma    Carotid artery stenosis    Carotid stenosis    COPD (chronic obstructive pulmonary disease) (HCC)    Coronary artery disease    Coronary artery disease    Hemorrhoids    Hyperlipidemia    Hypertension    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(HCC)    negative cardiac cath   Nicotine addiction    Peripheral vascular disease (HCC)    Pneumonia    Shortness of breath dyspnea    Tobacco abuse     Past Surgical History:  Procedure Laterality Date   ABDOMINAL HYSTERECTOMY     APPENDECTOMY     CARDIAC CATHETERIZATION     CARDIAC SURGERY     CHOLECYSTECTOMY  COLONOSCOPY  12/15/11   [place]->bleeding internal hemorrhoids, otherwise normal   COLONOSCOPY WITH PROPOFOL N/A 04/12/2015   Procedure: COLONOSCOPY WITH PROPOFOL;  Surgeon: Lucilla Lame, MD;  Location: [facility] ENDOSCOPY;  Service: Endoscopy;  Laterality: N/A;   COLONOSCOPY WITH PROPOFOL N/A 01/05/2020   Procedure: COLONOSCOPY WITH PROPOFOL;  Surgeon: Lucilla Lame, MD;  Location: [facility] ENDOSCOPY;  Service: Endoscopy;  Laterality: N/A;   ENDARTERECTOMY FEMORAL Left 04/26/2015   Procedure: ENDARTERECTOMY FEMORAL;  Surgeon: Algernon Huxley, MD;   Location: [facility] ORS;  Service: Vascular;  Laterality: Left;   ENDOVASCULAR STENT INSERTION Bilateral    Legs   FLEXIBLE BRONCHOSCOPY N/A 02/02/2015   Procedure: FLEXIBLE BRONCHOSCOPY;  Surgeon: Allyne Gee, MD;  Location: [facility] ORS;  Service: Pulmonary;  Laterality: N/A;   HEMORRHOID SURGERY     PERIPHERAL VASCULAR CATHETERIZATION Left 04/25/2015   Procedure: Lower Extremity Angiography;  Surgeon: Algernon Huxley, MD;  Location: [place] [facility];  Service: Cardiovascular;  Laterality: Left;   PERIPHERAL VASCULAR CATHETERIZATION Left 04/25/2015   Procedure: Lower Extremity Intervention;  Surgeon: Algernon Huxley, MD;  Location: [place] [facility];  Service: Cardiovascular;  Laterality: Left;   PERIPHERAL VASCULAR CATHETERIZATION Left 01/02/2016   Procedure: Lower Extremity Angiography;  Surgeon: Algernon Huxley, MD;  Location: [place] [facility] LAB;  Service: Cardiovascular;  Laterality: Left;   PERIPHERAL VASCULAR CATHETERIZATION  01/02/2016   Procedure: Lower Extremity Intervention;  Surgeon: Algernon Huxley, MD;  Location: [place] [facility];  Service: Cardiovascular;;   THROMBECTOMY FEMORAL ARTERY Left 04/26/2015   Procedure: THROMBECTOMY FEMORAL ARTERY;  Surgeon: Algernon Huxley, MD;  Location: [facility] ORS;  Service: Vascular;  Laterality: Left;    Social History:  reports that she has been smoking cigarettes. She has a 41.00 pack-year smoking history. She has never used smokeless tobacco. She reports current alcohol use. She reports that she does not use drugs.  Family History:  Family History  Problem Relation Age of Onset   Hypertension Mother    Hypertension Father      Prior to Admission medications   Medication Sig Start Date End Date Taking? Authorizing Provider  albuterol (VENTOLIN HFA) 108 (90 Base) MCG/ACT inhaler Inhale 2 puffs into the lungs every 6 (six) hours as needed for wheezing or shortness of breath. 05/19/21   Jonetta Osgood, NP  aspirin EC 81 MG EC tablet Take 1 tablet (81 mg total)  by mouth daily. 04/29/15   Algernon Huxley, MD  atorvastatin (LIPITOR) 80 MG tablet Take 1 tablet (80 mg total) by mouth daily. 05/19/21   Jonetta Osgood, NP  budesonide-formoterol (SYMBICORT) 80-4.5 MCG/ACT inhaler Inhale 2 puffs into the lungs daily. 05/19/21   Jonetta Osgood, NP  cholecalciferol (VITAMIN D) 400 units TABS tablet Take 400 Units by mouth.    [provider]  Evolocumab (REPATHA) 140 MG/ML SOSY Use as directed by cardiology 04/06/20   Lavera Guise, MD  ezetimibe (ZETIA) 10 MG tablet Take 1 tablet (10 mg total) by mouth daily. 05/19/21   Jonetta Osgood, NP  ferrous sulfate 325 (65 FE) MG tablet Take 325 mg by mouth daily.    [provider]  isosorbide mononitrate (IMDUR) 30 MG 24 hr tablet Take 1 tablet (30 mg total) by mouth daily. Patient not taking: Reported on 07/20/2021 05/19/21   Jonetta Osgood, NP  losartan (COZAAR) 50 MG tablet Take 1 tablet (50 mg total) by mouth daily. 05/19/21   Jonetta Osgood, NP  methylPREDNISolone (MEDROL DOSEPAK) 4 MG TBPK  tablet Medrol dosepak.  Please complete as per package directions 07/05/21   Sidney Ace, MD  nicotine (NICODERM CQ - DOSED IN MG/24 HOURS) 21 mg/24hr patch Place 1 patch (21 mg total) onto the skin daily. Patient not taking: Reported on 07/20/2021 07/06/21   Sidney Ace, MD    Physical Exam: Vitals:   08/05/21 1615 08/05/21 1630 08/05/21 1645 08/05/21 1659  BP: 120/68 118/71 117/68   Pulse: (!) 102 (!) 103 (!) 105   Resp: (!) 23 (!) 26 (!) 24   Temp:      SpO2: 98% 97% 98%   Weight:    49.9 kg   General: Not in acute distress.  Dry mucous membrane HEENT:       Eyes: PERRL, EOMI, no scleral icterus.       ENT: No discharge from the ears and nose, no pharynx injection, no tonsillar enlargement.        Neck: No JVD, no bruit, no mass felt. Heme: No neck lymph node enlargement. Cardiac: S1/S2, RRR, No murmurs, No gallops or rubs. Respiratory: Has decreased air movement bilaterally.   Has rhonchi bilaterally. GI: Soft, nondistended, nontender, no rebound pain, no organomegaly, BS present. GU: No hematuria Ext: No pitting leg edema bilaterally. 1+DP/PT pulse bilaterally. Musculoskeletal: No joint deformities, No joint redness or warmth, no limitation of ROM in spin. Skin: No rashes.  Neuro: Alert, oriented X3, cranial nerves II-XII grossly intact, moves all extremities normally.  Psych: Patient is not psychotic, no suicidal or hemocidal ideation.  Labs on Admission: I have personally reviewed following labs and imaging studies  CBC: Recent Labs  Lab 08/05/21 1533  WBC 14.0*  NEUTROABS 12.4*  HGB 12.0  HCT 36.5  MCV 98.6  PLT 001*   Basic Metabolic Panel: Recent Labs  Lab 08/05/21 1533  NA 133*  K 3.2*  CL 102  CO2 21*  GLUCOSE 116*  BUN 40*  CREATININE 1.91*  CALCIUM 8.7*   GFR: Estimated Creatinine Clearance: 23.1 mL/min (A) (by C-G formula based on SCr of 1.91 mg/dL (H)). Liver Function Tests: Recent Labs  Lab 08/05/21 1533  AST 27  ALT 17  ALKPHOS 49  BILITOT 1.1  PROT 6.9  ALBUMIN 3.2*   No results for input(s): LIPASE, AMYLASE in the last 168 hours. No results for input(s): AMMONIA in the last 168 hours. Coagulation Profile: Recent Labs  Lab 08/05/21 1533  INR 1.1   Cardiac Enzymes: No results for input(s): CKTOTAL, CKMB, CKMBINDEX, TROPONINI in the last 168 hours. BNP (last 3 results) No results for input(s): PROBNP in the last 8760 hours. HbA1C: No results for input(s): HGBA1C in the last 72 hours. CBG: No results for input(s): GLUCAP in the last 168 hours. Lipid Profile: No results for input(s): CHOL, HDL, LDLCALC, TRIG, CHOLHDL, LDLDIRECT in the last 72 hours. Thyroid Function Tests: No results for input(s): TSH, T4TOTAL, FREET4, T3FREE, THYROIDAB in the last 72 hours. Anemia Panel: No results for input(s): VITAMINB12, FOLATE, FERRITIN, TIBC, IRON, RETICCTPCT in the last 72 hours. Urine analysis:    Component Value  Date/Time   COLORURINE STRAW (A) 12/31/2020 0103   APPEARANCEUR Turbid (A) 05/19/2021 1551   LABSPEC 1.031 (H) 12/31/2020 0103   LABSPEC 1.015 10/15/2013 1124   PHURINE 5.0 12/31/2020 0103   GLUCOSEU Negative 05/19/2021 1551   GLUCOSEU Negative 10/15/2013 1124   HGBUR NEGATIVE 12/31/2020 0103   BILIRUBINUR Negative 05/19/2021 1551   BILIRUBINUR Negative 10/15/2013 1124   [place] 12/31/2020 0103  PROTEINUR 2+ (A) 05/19/2021 1551   PROTEINUR NEGATIVE 12/31/2020 0103   NITRITE Negative 05/19/2021 1551   NITRITE NEGATIVE 12/31/2020 0103   LEUKOCYTESUR Negative 05/19/2021 1551   LEUKOCYTESUR NEGATIVE 12/31/2020 0103   LEUKOCYTESUR Negative 10/15/2013 1124   Sepsis Labs: @LABRCNTIP (procalcitonin:4,lacticidven:4) ) Recent Results (from the past 240 hour(s))  Resp Panel by RT-PCR (Flu A&B, Covid) Nasopharyngeal Swab     Status: None   Collection Time: 08/05/21  4:04 PM   Specimen: Nasopharyngeal Swab; Nasopharyngeal(NP) swabs in vial transport medium  Result Value Ref Range Status   SARS Coronavirus 2 by RT PCR NEGATIVE NEGATIVE Final    Comment: (NOTE) SARS-CoV-2 target nucleic acids are NOT DETECTED.  The SARS-CoV-2 RNA is generally detectable in upper respiratory specimens during the acute phase of infection. The lowest concentration of SARS-CoV-2 viral copies this assay can detect is 138 copies/mL. A negative result does not preclude SARS-Cov-2 infection and should not be used as the sole basis for treatment or other patient management decisions. A negative result may occur with  improper specimen collection/handling, submission of specimen other than nasopharyngeal swab, presence of viral mutation(s) within the areas targeted by this assay, and inadequate number of viral copies(<138 copies/mL). A negative result must be combined with clinical observations, patient history, and epidemiological information. The expected result is Negative.  Fact Sheet for Patients:   EntrepreneurPulse.com.au  Fact Sheet for Healthcare Providers:  IncredibleEmployment.be  This test is no t yet approved or cleared by the [place] FDA and  has been authorized for detection and/or diagnosis of SARS-CoV-2 by FDA under an Emergency Use Authorization (EUA). This EUA will remain  in effect (meaning this test can be used) for the duration of the COVID-19 declaration under Section 564(b)(1) of the Act, 21 U.S.C.section 360bbb-3(b)(1), unless the authorization is terminated  or revoked sooner.       Influenza A by PCR NEGATIVE NEGATIVE Final   Influenza B by PCR NEGATIVE NEGATIVE Final    Comment: (NOTE) The Xpert Xpress SARS-CoV-2/FLU/RSV plus assay is intended as an aid in the diagnosis of influenza from Nasopharyngeal swab specimens and should not be used as a sole basis for treatment. Nasal washings and aspirates are unacceptable for Xpert Xpress SARS-CoV-2/FLU/RSV testing.  Fact Sheet for Patients: EntrepreneurPulse.com.au  Fact Sheet for Healthcare Providers: IncredibleEmployment.be  This test is not yet approved or cleared by the [place] FDA and has been authorized for detection and/or diagnosis of SARS-CoV-2 by FDA under an Emergency Use Authorization (EUA). This EUA will remain in effect (meaning this test can be used) for the duration of the COVID-19 declaration under Section 564(b)(1) of the Act, 21 U.S.C. section 360bbb-3(b)(1), unless the authorization is terminated or revoked.  Performed at [facility], [address], [place], [place] [address]      Radiological Exams on Admission: DG Chest [facility] 1 View  Result Date: 08/05/2021 CLINICAL DATA:  Questionable sepsis. EXAM: PORTABLE CHEST 1 VIEW.  Patient is rotated. COMPARISON:  Chest x-ray 06/22/2021, CT chest 12/31/2020. CT chest 12/31/2020 FINDINGS: The heart and mediastinal contours are unchanged. Aortic  calcification. Interval development of patchy airspace opacities along the right middle lobe. No definite associated cavitary lesion. Stable calcified subcentimeter nodule at the right apex. Chronic coarsened interstitial markings with no overt pulmonary edema. No pleural effusion. No pneumothorax. No acute osseous abnormality. IMPRESSION: 1. Interval development of patchy airspace opacities along the right middle lobe which could represent a combination of infection/inflammation versus atelectasis. Followup PA and lateral  chest X-ray is recommended in 3-4 weeks following therapy to ensure resolution and exclude underlying malignancy. 2. Aortic Atherosclerosis (ICD10-I70.0) and Emphysema (ICD10-J43.9). Electronically Signed   By: Iven Finn M.D.   On: 08/05/2021 16:01     EKG: I have personally reviewed.  Sinus rhythm, QTC 394, ST depression in the inferior leads and V5-V6.  Assessment/Plan Principal Problem:   HCAP (healthcare-associated pneumonia) Active Problems:   H/O hypercholesterolemia   Essential hypertension   Tobacco use disorder   Alcohol abuse   Coronary artery disease due to lipid rich plaque   AKI (acute kidney injury) (HCC)   COPD exacerbation (HCC)   Severe sepsis (HCC)   Thrombocytopenia (HCC)   Hypokalemia   Elevated troponin   Iron deficiency anemia   Diarrhea   Sepsis sepsis due to HCAP and COPD exacerbation: Patient was initially hypotensive with blood pressure down to 46/30, which improved with IV fluid resuscitation, improved to 120/68 after giving total of 3.25 L of IV fluid bolus.  Chest x-ray showed right middle lobe infiltration. Pending lactic acid level.  -Admitted to progressive unit as inpatient - IV Vancomycin and cefepime - Solu-Medrol 40 mg twice daily - Incentive spirometry - Mucinex for cough  - Bronchodilators - Urine legionella and S. pneumococcal antigen - Follow up blood culture x2, sputum culture  - will get Procalcitonin and trend  lactic acid level per sepsis protocol - IVF: 1.75 L of LR and 1L of NS in addition to 500 cc of IVF by EMS, then 125 cc/h of LR  H/O hypercholesterolemia -Zetia, lipitor -pt is on Repatha  Essential hypertension -Hold all Bp meds now  Tobacco use disorder and alcohol abuse -Nicotine patch -CiWA protocol -Did counseling about importance of quitting smoking and drinking alcohol  Coronary artery disease due to lipid rich plaque and elevated troponin: Troponin level 20, most likely due to demand ischemia -Continue aspirin, Lipitor -hold Imdur -Trend troponin -Check A1c, FLP  AKI (acute kidney injury) ([place]): Likely due to dehydration and possible ATN secondary to hypotension -Hold Cozaar -IV fluid as above  Thrombocytopenia ([place]): This is chronic issue.  Platelet 114 (125 on 07/05/2021).  Etiology is not clear, may be related to alcohol abuse -Follow-up with CBC  Hypokalemia: Potassium 3.2 -Repleted potassium -Check magnesium and phosphorus level  Iron deficiency anemia: Hemoglobin stable, 12.0. -Continue iron supplement  Diarrhea: -IV fluid as above -Check C. difficile and GI pathogen panel       DVT ppx: SQ Lovenox Code Status: Full code Family Communication:  Yes, patient's aunt by phone Disposition Plan:  Anticipate discharge back to previous environment Consults called:  none Admission status and Level of care: Progressive:     as inpt        Status is: Inpatient  Remains inpatient appropriate because: Patient has multiple comorbidities, now presents with sepsis due to HCAP and COPD exacerbation.  Patient has hypotension initially, hypokalemia and AKI.  Her presentation is highly complicated.  Patient at high risk of deteriorating.  Will need to be treated in hospital for at least 2 days.           Date of Service 08/05/2021    Ivor Costa [facility]   If 7PM-7AM, please contact night-coverage [email or web address] 08/05/2021, 5:41 PM

## 2021-08-05 NOTE — Consult Note (Signed)
CODE SEPSIS - PHARMACY COMMUNICATION  **Broad Spectrum Antibiotics should be administered within 1 hour of Sepsis diagnosis**  Time Code Sepsis Called/Page Received: 1613  Antibiotics Ordered: azithromycin and ceftriaxone  Time of 1st antibiotic administration: 1629  Additional action taken by pharmacy: none  If necessary, Name of Provider/Nurse Contacted: n/a     [name] PharmD, BCPS 08/05/2021 4:09 PM

## 2021-08-05 NOTE — Consult Note (Addendum)
Pharmacy Antibiotic Note  [name] is a 65 y.o. female w/ h/o HTN, CAD, HLD, PVD, Tobacco, COPD/Asthma, and PNA admitted on 08/05/2021 with HCAP.  Pharmacy has been consulted for Vancomycin & Cefepime dosing.  12/3 CT Chest: Interval development of patchy airspace opacities along the right middle lobe which could represent a combination of infection/inflammation versus atelectasis. Followup PA and lateral chest X-ray is recommended in 3-4 weeks following therapy to ensure resolution and exclude underlying malignancy.  Plan: AKI Scr 1.91 (BL ~0.6-0.8)  Cefepime: Initiate cefepime 2g IV q24h  Vancomycin: Vancomycin 1g x1; followed by maintenance dosing ~36-48hr dosing interval ISO AKI. Will f/u w/ AM Scr to assess strategy of dose by levels VS scheduled maintenance if renal fxn improving. MRSA PCR ordered w/ labs for monitoring.   Temp (24hrs), Avg:98.5 F (36.9 C), Min:98.5 F (36.9 C), Max:98.5 F (36.9 C)  Recent Labs  Lab 08/05/21 1533  WBC 14.0*  CREATININE 1.91*    CrCl cannot be calculated (Unknown ideal weight.).    Allergies  Allergen Reactions   Aspirin Nausea And Vomiting and Other (See Comments)    Pt states aspirin makes her cramp and have to use coated kind.   Tramadol    Zyrtec [Cetirizine]     Antimicrobials this admission: vancomycin (12/3 >>  Cefepime (12/3 >>  Rocephin/Azith  x1 in ED (12/3)  Dose adjustments this admission: CTM ISO AKI and adjust PRN  Microbiology results: 12/03 BCx: pending 12/03 UCx: pending  12/03 Flu/Cov: negative  12/03 MRSA PCR: ordered 12/03 Strep & legion Urinary Ag: ordered  Thank you for allowing pharmacy to be a part of this patient's care.  Lorna Dibble 08/05/2021 4:58 PM

## 2021-08-05 NOTE — ED Triage Notes (Signed)
Pt comes with c/o lightheadedness and dizziness from home. EMS reports pt was hypotensive at 46/30 then 66/44 after 500 of fluids. CBG-163, 92% on RA pt placed on 2L and O2 at 94%. HR-133 then 109 upon arrival to [facility].

## 2021-08-06 DIAGNOSIS — J189 Pneumonia, unspecified organism: Secondary | ICD-10-CM

## 2021-08-06 DIAGNOSIS — R6520 Severe sepsis without septic shock: Secondary | ICD-10-CM

## 2021-08-06 DIAGNOSIS — A419 Sepsis, unspecified organism: Secondary | ICD-10-CM

## 2021-08-06 LAB — LIPID PANEL
Cholesterol: 63 mg/dL (ref 0–200)
HDL: 46 mg/dL (ref >40)
LDL Cholesterol: 8 mg/dL (ref 0–99)
Total CHOL/HDL Ratio: 1.4 RATIO
Triglycerides: 44 mg/dL (ref <150)
VLDL: 9 mg/dL (ref 0–40)

## 2021-08-06 LAB — BLOOD CULTURE ID PANEL (REFLEXED) - BCID2

## 2021-08-06 LAB — CBC
HCT: 31.6 % — ABNORMAL LOW (ref 36.0–46.0)
Hemoglobin: 10.6 g/dL — ABNORMAL LOW (ref 12.0–15.0)
MCH: 32.7 pg (ref 26.0–34.0)
MCHC: 33.5 g/dL (ref 30.0–36.0)
MCV: 97.5 fL (ref 80.0–100.0)
Platelets: 108 10*3/uL — ABNORMAL LOW (ref 150–400)
RBC: 3.24 MIL/uL — ABNORMAL LOW (ref 3.87–5.11)
RDW: 14.1 % (ref 11.5–15.5)
WBC: 9.3 10*3/uL (ref 4.0–10.5)
nRBC: 0 % (ref 0.0–0.2)

## 2021-08-06 LAB — EXPECTORATED SPUTUM ASSESSMENT W GRAM STAIN, RFLX TO RESP C

## 2021-08-06 LAB — BASIC METABOLIC PANEL
Anion gap: 6 (ref 5–15)
BUN: 23 mg/dL (ref 8–23)
CO2: 22 mmol/L (ref 22–32)
Calcium: 8.3 mg/dL — ABNORMAL LOW (ref 8.9–10.3)
Chloride: 106 mmol/L (ref 98–111)
Creatinine, Ser: 0.83 mg/dL (ref 0.44–1.00)
GFR, Estimated: >60 mL/min (ref >60)
Glucose, Bld: 90 mg/dL (ref 70–99)
Potassium: 3.1 mmol/L — ABNORMAL LOW (ref 3.5–5.1)
Sodium: 134 mmol/L — ABNORMAL LOW (ref 135–145)

## 2021-08-06 MED ORDER — SODIUM CHLORIDE 0.9 % IV SOLN
2.0000 g | INTRAVENOUS | Status: DC
  Administered 2021-08-06 – 2021-08-10 (×5): 2 g via INTRAVENOUS
  Filled 2021-08-06: qty 2
  Filled 2021-08-06 (×3): qty 20
  Filled 2021-08-06: qty 2

## 2021-08-06 MED ORDER — GUAIFENESIN-DM 100-10 MG/5ML PO SYRP
5.0000 mL | ORAL_SOLUTION | ORAL | Status: DC | PRN
  Administered 2021-08-06 – 2021-08-09 (×5): 5 mL via ORAL
  Filled 2021-08-06 (×5): qty 5

## 2021-08-06 MED ORDER — VITAMIN D 25 MCG (1000 UNIT) PO TABS
500.0000 [IU] | ORAL_TABLET | Freq: Every day | ORAL | Status: DC
Start: 2021-08-07 — End: 2021-08-10
  Administered 2021-08-07 – 2021-08-10 (×4): 500 [IU] via ORAL
  Filled 2021-08-06 (×4): qty 1

## 2021-08-06 MED ORDER — LOSARTAN POTASSIUM 50 MG PO TABS
50.0000 mg | ORAL_TABLET | Freq: Every day | ORAL | Status: DC
  Administered 2021-08-06 – 2021-08-10 (×5): 50 mg via ORAL
  Filled 2021-08-06 (×5): qty 1

## 2021-08-06 MED ORDER — ISOSORBIDE MONONITRATE ER 30 MG PO TB24
30.0000 mg | ORAL_TABLET | Freq: Every day | ORAL | Status: DC
  Administered 2021-08-06 – 2021-08-10 (×5): 30 mg via ORAL
  Filled 2021-08-06 (×5): qty 1

## 2021-08-06 MED ORDER — AZITHROMYCIN 250 MG PO TABS
500.0000 mg | ORAL_TABLET | Freq: Every day | ORAL | Status: DC
  Administered 2021-08-06 – 2021-08-08 (×3): 500 mg via ORAL
  Filled 2021-08-06 (×3): qty 2

## 2021-08-06 MED ORDER — SODIUM CHLORIDE 0.9 % IV SOLN
2.0000 g | INTRAVENOUS | Status: DC
Start: 2021-08-06 — End: 2021-08-06

## 2021-08-06 NOTE — Progress Notes (Signed)
PHARMACY - PHYSICIAN COMMUNICATION CRITICAL VALUE ALERT - BLOOD CULTURE IDENTIFICATION (BCID)  [name] is an 64 y.o. female w/ h/o HTN, CAD, HLD, PVD, Tobacco, COPD/Asthma, and PNA who presented to [facility] on 08/05/2021 with a chief complaint of HCAP  Assessment:  1/4 Streptococcus pneumoniae, no resistance detected  Name of physician (or Provider) Contacted: Serita Grit, MD  Current antibiotics: vancomycin and cefepime  Changes to prescribed antibiotics recommended:   Changed antibiotics to ceftriaxone 2 grams IV once daily + azithromycin 500 mg po once daily (for PNA)  Results for orders placed or performed during the hospital encounter of 08/05/21  Blood Culture ID Panel (Reflexed) (Collected: 08/05/2021  4:04 PM)  Result Value Ref Range   Enterococcus faecalis NOT DETECTED NOT DETECTED   Enterococcus Faecium NOT DETECTED NOT DETECTED   Listeria monocytogenes NOT DETECTED NOT DETECTED   Staphylococcus species NOT DETECTED NOT DETECTED   Staphylococcus aureus (BCID) NOT DETECTED NOT DETECTED   Staphylococcus epidermidis NOT DETECTED NOT DETECTED   Staphylococcus lugdunensis NOT DETECTED NOT DETECTED   Streptococcus species DETECTED (A) NOT DETECTED   Streptococcus agalactiae NOT DETECTED NOT DETECTED   Streptococcus pneumoniae DETECTED (A) NOT DETECTED   Streptococcus pyogenes NOT DETECTED NOT DETECTED   A.calcoaceticus-baumannii NOT DETECTED NOT DETECTED   Bacteroides fragilis NOT DETECTED NOT DETECTED   Enterobacterales NOT DETECTED NOT DETECTED   Enterobacter cloacae complex NOT DETECTED NOT DETECTED   Escherichia coli NOT DETECTED NOT DETECTED   Klebsiella aerogenes NOT DETECTED NOT DETECTED   Klebsiella oxytoca NOT DETECTED NOT DETECTED   Klebsiella pneumoniae NOT DETECTED NOT DETECTED   Proteus species NOT DETECTED NOT DETECTED   Salmonella species NOT DETECTED NOT DETECTED   Serratia marcescens NOT DETECTED NOT DETECTED   Haemophilus influenzae NOT DETECTED NOT  DETECTED   Neisseria meningitidis NOT DETECTED NOT DETECTED   Pseudomonas aeruginosa NOT DETECTED NOT DETECTED   Stenotrophomonas maltophilia NOT DETECTED NOT DETECTED   Candida albicans NOT DETECTED NOT DETECTED   Candida auris NOT DETECTED NOT DETECTED   Candida glabrata NOT DETECTED NOT DETECTED   Candida krusei NOT DETECTED NOT DETECTED   Candida parapsilosis NOT DETECTED NOT DETECTED   Candida tropicalis NOT DETECTED NOT DETECTED   Cryptococcus neoformans/gattii NOT DETECTED NOT DETECTED    Dallie Piles 08/06/2021  10:46 AM

## 2021-08-06 NOTE — Progress Notes (Signed)
[facility] [place] NAME: [name]    MR#:  [id number]  DATE OF BIRTH:  1956-07-03  SUBJECTIVE:  came in with increasing shortness of breath and cough with productive phlegm and generalized weakness found to have pneumonia. She was admitted and of October with influenza A. Continues to smoke at home. Does not wear oxygen chronically.  Patient's niece and friend are in the room. No fever today. Continues with some cough  REVIEW OF SYSTEMS:   Review of Systems  Constitutional:  Positive for malaise/fatigue. Negative for chills, fever and weight loss.  HENT:  Negative for ear discharge, ear pain and nosebleeds.   Eyes:  Negative for blurred vision, pain and discharge.  Respiratory:  Positive for cough, sputum production and shortness of breath. Negative for wheezing and stridor.   Cardiovascular:  Negative for chest pain, palpitations, orthopnea and PND.  Gastrointestinal:  Negative for abdominal pain, diarrhea, nausea and vomiting.  Genitourinary:  Negative for frequency and urgency.  Musculoskeletal:  Negative for back pain and joint pain.  Neurological:  Positive for weakness. Negative for sensory change, speech change and focal weakness.  Psychiatric/Behavioral:  Negative for depression and hallucinations. The patient is not nervous/anxious.   Tolerating Diet: Tolerating PT:   DRUG ALLERGIES:   Allergies  Allergen Reactions  . Aspirin Nausea And Vomiting and Other (See Comments)    Pt states aspirin makes her cramp and have to use coated kind.  . Tramadol   . Zyrtec [Cetirizine]     VITALS:  Blood pressure (!) 147/69, pulse 91, temperature 98 F (36.7 C), resp. rate 18, weight 49.9 kg, SpO2 99 %.  PHYSICAL EXAMINATION:   Physical Exam  GENERAL:  65 y.o.-year-old patient lying in the bed with no acute distress.  HEENT: Head atraumatic, normocephalic. Oropharynx and nasopharynx clear.  LUNGS:decreased breath sounds  bilaterally, no wheezing, rales, rhonchi. No use of accessory muscles of respiration.  CARDIOVASCULAR: S1, S2 normal. No murmurs, rubs, or gallops.  ABDOMEN: Soft, nontender, nondistended. Bowel sounds present. No organomegaly or mass.  EXTREMITIES: No cyanosis, clubbing or edema b/l.    NEUROLOGIC: nonfocal PSYCHIATRIC:  patient is alert and oriented x 3.  SKIN: No obvious rash, lesion, or ulcer.   LABORATORY PANEL:  CBC Recent Labs  Lab 08/06/21 0539  WBC 9.3  HGB 10.6*  HCT 31.6*  PLT 108*    Chemistries  Recent Labs  Lab 08/05/21 1533 08/05/21 1643 08/06/21 0539  NA 133*  --  134*  K 3.2*  --  3.1*  CL 102  --  106  CO2 21*  --  22  GLUCOSE 116*  --  90  BUN 40*  --  23  CREATININE 1.91*  --  0.83  CALCIUM 8.7*  --  8.3*  MG  --  1.5*  --   AST 27  --   --   ALT 17  --   --   ALKPHOS 49  --   --   BILITOT 1.1  --   --    Cardiac Enzymes No results for input(s): TROPONINI in the last 168 hours. RADIOLOGY:  DG Chest Port 1 View  Result Date: 08/05/2021 CLINICAL DATA:  Questionable sepsis. EXAM: PORTABLE CHEST 1 VIEW.  Patient is rotated. COMPARISON:  Chest x-ray 06/22/2021, CT chest 12/31/2020. CT chest 12/31/2020 FINDINGS: The heart and mediastinal contours are unchanged. Aortic calcification. Interval development of patchy airspace opacities along the right middle lobe. No  definite associated cavitary lesion. Stable calcified subcentimeter nodule at the right apex. Chronic coarsened interstitial markings with no overt pulmonary edema. No pleural effusion. No pneumothorax. No acute osseous abnormality. IMPRESSION: 1. Interval development of patchy airspace opacities along the right middle lobe which could represent a combination of infection/inflammation versus atelectasis. Followup PA and lateral chest X-ray is recommended in 3-4 weeks following therapy to ensure resolution and exclude underlying malignancy. 2. Aortic Atherosclerosis (ICD10-I70.0) and Emphysema  (ICD10-J43.9). Electronically Signed   By: Iven Finn M.D.   On: 08/05/2021 16:01   ASSESSMENT AND PLAN:   [name] is a 65 y.o. female with medical history significant of hypertension, hyperlipidemia, COPD, asthma, tobacco abuse, alcohol abuse, PVD, CAD, myocardial infarction, carotid artery stenosis, abscess of lung, GI bleeding, iron deficiency anemia, who presents with lightheadedness and dizziness, cough, shortness breath, diarrhea.  Patient was recently hospitalized from 10/29 -11/2 due to COPD exacerbation and Flu A infection.  Sepsis sepsis due to HCAP and COPD exacerbation:  Streptococcal Pneumonia bacterimia Patient was initially hypotensive with blood pressure down to 46/30, which improved with IV fluid resuscitation, improved to 120/68 after giving total of 3.25 L of IV fluid bolus.  Chest x-ray showed right middle lobe infiltration.  --Lactic acid 2.0--1.5  - Solu-Medrol 40 mg twice daily - Incentive spirometry - Mucinex for cough  - Bronchodilators - Follow up blood culture x2, sputum culture  - Procalcitonin  60.68 --Cont Rocephin and zithromax  --BCID positive for strep penumoni  hypercholesterolemia -Zetia, lipitor -pt is on Repatha   Essential hypertension -will resume bp meds since it is trending up   Tobacco use disorder and alcohol abuse -Nicotine patch -CiWA protocol -Did counseling about importance of quitting smoking and drinking alcohol   Coronary artery disease due to lipid rich plaque and elevated troponin: Troponin level 20, most likely due to demand ischemia -Continue aspirin, Lipitor -Trend troponin--flat   AKI (acute kidney injury) ([place]): Likely due to dehydration and possible ATN secondary to hypotension --resolved after IVF   Thrombocytopenia ([place]): This is chronic issue.  Platelet 114 (125 on 07/05/2021).  Etiology is not clear, may be related to alcohol abuse   Hypokalemia: Potassium 3.2 -Repleted potassium -Check magnesium and  phosphorus level   Iron deficiency anemia: Hemoglobin stable, 12.0. -Continue iron supplement   Diarrhea: -IV fluid as above -Check C. difficile and GI pathogen panel--stool yet to be sent             DVT ppx: SQ Lovenox Code Status: Full code Family Communication:  Yes, patient's niece  Disposition Plan:  Anticipate discharge back to previous environment Consults called:  none Level of care: Progressive Status is: Inpatient  Remains inpatient appropriate because: sepsis due to pneumonia        TOTAL TIME TAKING CARE OF THIS PATIENT: 30 minutes.  >50% time spent on counselling and coordination of care  Note: This dictation was prepared with Dragon dictation along with smaller phrase technology. Any transcriptional errors that result from this process are unintentional.  Fritzi Mandes M.D    [facility]   CC: Primary care physician; Lavera Guise, MD Patient ID: [name], female   DOB: [birth year], 65 y.o.   MRN: [id number]

## 2021-08-06 NOTE — Evaluation (Signed)
Physical Therapy Evaluation Patient Details Name: [name] MRN: [id number] DOB: 1956/07/21 Today's Date: 08/06/2021  History of Present Illness  Pt is a 65 y/o F admitted on 08/05/21 with c/c of lightheadedness, dizziness, cough, SOB & diarrhea. Pt was recently hospitalized 10/29-11/2 2/2 COPD exacerbation & Flu A infection. Pt was found to have hypotension & given IVF. Pt is being treated for sepsis 2/2 HCAP & COPD exacerbation. PMH: HTN, HLD, COPD, asthma, tobacco abuse, alcohol abuse, PVD, CAD, MI, CAD, lung abscess, GI bleeding, iron deficiency anemia  Clinical Impression  Pt seen for PT evaluation with pt reporting she's independent without AD & denies falling prior to admission. On this date, pt is mod I for bed mobility, independent for transfers & gait in room without AD. Pt is limited by SOB with ambulation but able to recover extremely quickly once sitting & with education for pursed lip breathing. At this time, pt does not demonstrate any acute PT needs but has been encouraged to ambulate with nursing staff while in hospital.   Pt received on 1.5L/min via nasal cannula, SpO2 96% Pt placed on room air & maintained >90% while sitting EOB After walking 2 laps in room on room air pt with c/o SOB & SpO2 dropped to 87% but returned to 90% within seconds of sitting down & SpO2 93% on room air lying in bed upon PT departure       Recommendations for follow up therapy are one component of a multi-disciplinary discharge planning process, led by the attending physician.  Recommendations may be updated based on patient status, additional functional criteria and insurance authorization.  Follow Up Recommendations No PT follow up    Assistance Recommended at Discharge PRN  Functional Status Assessment Patient has not had a recent decline in their functional status  Equipment Recommendations  None recommended by PT    Recommendations for Other Services       Precautions / Restrictions  Precautions Precautions: None Restrictions Weight Bearing Restrictions: No      Mobility  Bed Mobility Overal bed mobility: Modified Independent                  Transfers Overall transfer level: Independent Equipment used: None                    Ambulation/Gait Ambulation/Gait assistance: Independent Gait Distance (Feet): 85 Feet Assistive device: None Gait Pattern/deviations: WFL(Within Functional Limits)          Stairs            Wheelchair Mobility    Modified Rankin (Stroke Patients Only)       Balance Overall balance assessment: Independent                                           Pertinent Vitals/Pain Pain Assessment: No/denies pain    Home Living Family/patient expects to be discharged to:: Private residence Living Arrangements: Children Available Help at Discharge: Family;Available 24 hours/day Type of Home: Apartment Home Access: Stairs to enter Entrance Stairs-Rails: Chemical engineer of Steps: 2   Home Layout: One level Home Equipment: Conservation officer, nature (2 wheels);Cane - single point      Prior Function Prior Level of Function : Independent/Modified Independent             Mobility Comments: Pt reports she's independent without AD, no falls prior to  admission.       Hand Dominance        Extremity/Trunk Assessment   Upper Extremity Assessment Upper Extremity Assessment: Overall WFL for tasks assessed    Lower Extremity Assessment Lower Extremity Assessment: Overall WFL for tasks assessed       Communication   Communication: No difficulties  Cognition Arousal/Alertness: Awake/alert Behavior During Therapy: WFL for tasks assessed/performed Overall Cognitive Status: Within Functional Limits for tasks assessed                                          General Comments      Exercises     Assessment/Plan    PT Assessment Patient does not need  any further PT services  PT Problem List         PT Treatment Interventions      PT Goals (Current goals can be found in the Care Plan section)  Acute Rehab PT Goals Patient Stated Goal: get better, go home PT Goal Formulation: With patient Time For Goal Achievement: 08/20/21 Potential to Achieve Goals: Good    Frequency     Barriers to discharge        Co-evaluation               AM-PAC PT "6 Clicks" Mobility  Outcome Measure Help needed turning from your back to your side while in a flat bed without using bedrails?: None Help needed moving from lying on your back to sitting on the side of a flat bed without using bedrails?: None Help needed moving to and from a bed to a chair (including a wheelchair)?: None Help needed standing up from a chair using your arms (e.g., wheelchair or bedside chair)?: None Help needed to walk in hospital room?: None Help needed climbing 3-5 steps with a railing? : None 6 Click Score: 24    End of Session   Activity Tolerance: Patient tolerated treatment well Patient left: in bed;with bed alarm set;with call bell/phone within reach Nurse Communication: Mobility status (notified MD of O2 readings)      Time: 1431-1451 PT Time Calculation (min) (ACUTE ONLY): 20 min   Charges:   PT Evaluation $PT Eval Low Complexity: [place], PT, DPT 08/06/21, 2:53 PM   Waunita Schooner 08/06/2021, 2:51 PM

## 2021-08-07 LAB — GLUCOSE, CAPILLARY: Glucose-Capillary: 171 mg/dL — ABNORMAL HIGH (ref 70–99)

## 2021-08-07 LAB — HEMOGLOBIN A1C
Hgb A1c MFr Bld: 5.8 % — ABNORMAL HIGH (ref 4.8–5.6)
Mean Plasma Glucose: 120 mg/dL

## 2021-08-07 LAB — LEGIONELLA PNEUMOPHILA SEROGP 1 UR AG: L. pneumophila Serogp 1 Ur Ag: NEGATIVE

## 2021-08-07 LAB — URINE CULTURE: Culture: NO GROWTH

## 2021-08-07 MED ORDER — IPRATROPIUM-ALBUTEROL 0.5-2.5 (3) MG/3ML IN SOLN
3.0000 mL | Freq: Three times a day (TID) | RESPIRATORY_TRACT | Status: DC
  Administered 2021-08-07 – 2021-08-08 (×4): 3 mL via RESPIRATORY_TRACT
  Filled 2021-08-07 (×4): qty 3

## 2021-08-07 MED ORDER — ENOXAPARIN SODIUM 40 MG/0.4ML IJ SOSY
40.0000 mg | PREFILLED_SYRINGE | INTRAMUSCULAR | Status: DC
  Administered 2021-08-07 – 2021-08-09 (×3): 40 mg via SUBCUTANEOUS
  Filled 2021-08-07 (×3): qty 0.4

## 2021-08-07 MED ORDER — SODIUM CHLORIDE 0.9% FLUSH
10.0000 mL | Freq: Two times a day (BID) | INTRAVENOUS | Status: DC
  Administered 2021-08-07 – 2021-08-10 (×4): 10 mL via INTRAVENOUS

## 2021-08-07 NOTE — Progress Notes (Signed)
PHARMACY CONSULT NOTE  Pharmacy Consult for Electrolyte Monitoring and Replacement   Recent Labs: Potassium (mmol/L)  Date Value  08/06/2021 3.1 (L)  10/15/2013 3.6   Magnesium (mg/dL)  Date Value  08/05/2021 1.5 (L)   Calcium (mg/dL)  Date Value  08/06/2021 8.3 (L)   Calcium, Total (mg/dL)  Date Value  10/15/2013 9.1   Albumin (g/dL)  Date Value  08/05/2021 3.2 (L)  11/11/2017 4.9 (H)   Phosphorus (mg/dL)  Date Value  08/05/2021 3.6   Sodium (mmol/L)  Date Value  08/06/2021 134 (L)  11/11/2017 139  10/15/2013 131 (L)   Assessment: 65 y.o. female w/ PMH of HTN, HLD, COPD, asthma, EtOH abuse, PVD, CAD, MI who presents with lightheadedness and dizziness, cough, shortness breath, diarrhea. Pharmacy is asked to follow and replace electrolytes  Goal of Therapy:  Electrolytes WNL  Plan:  40 mEq oral KCl x 1 Recheck electrolytes in am  Dallie Piles ,PharmD Clinical Pharmacist 08/07/2021 3:29 PM
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
## 2021-08-07 NOTE — Progress Notes (Signed)
[name] NAME: [name]    MR#:  [id number]  DATE OF BIRTH:  12/23/1955  SUBJECTIVE:  came in with increasing shortness of breath and cough with productive phlegm and generalized weakness found to have pneumonia. She was admitted and of October with influenza A. Continues to smoke at home. Does not wear oxygen chronically.  Patient sister is at bedside. Earlier sitting at the edge of the bed eating breakfast. Currently on room air. Tolerating PO diet well. Mild cough. REVIEW OF SYSTEMS:   Review of Systems  Constitutional:  Positive for malaise/fatigue. Negative for chills, fever and weight loss.  HENT:  Negative for ear discharge, ear pain and nosebleeds.   Eyes:  Negative for blurred vision, pain and discharge.  Respiratory:  Positive for cough, sputum production and shortness of breath. Negative for wheezing and stridor.   Cardiovascular:  Negative for chest pain, palpitations, orthopnea and PND.  Gastrointestinal:  Negative for abdominal pain, diarrhea, nausea and vomiting.  Genitourinary:  Negative for frequency and urgency.  Musculoskeletal:  Negative for back pain and joint pain.  Neurological:  Positive for weakness. Negative for sensory change, speech change and focal weakness.  Psychiatric/Behavioral:  Negative for depression and hallucinations. The patient is not nervous/anxious.   Tolerating Diet:yes Tolerating PT: NO PT f/u needed  DRUG ALLERGIES:   Allergies  Allergen Reactions  . Aspirin Nausea And Vomiting and Other (See Comments)    Pt states aspirin makes her cramp and have to use coated kind.  . Tramadol   . Zyrtec [Cetirizine]     VITALS:  Blood pressure (!) 156/78, pulse 88, temperature 98 F (36.7 C), resp. rate 18, weight 49.9 kg, SpO2 93 %.  PHYSICAL EXAMINATION:   Physical Exam  GENERAL:  65 y.o.-year-old patient lying in the bed with no acute distress.  HEENT: Head atraumatic,  normocephalic. Oropharynx and nasopharynx clear.  LUNGS:decreased breath sounds bilaterally, no wheezing, rales, rhonchi. No use of accessory muscles of respiration.  CARDIOVASCULAR: S1, S2 normal. No murmurs, rubs, or gallops.  ABDOMEN: Soft, nontender, nondistended. Bowel sounds present. No organomegaly or mass.  EXTREMITIES: No cyanosis, clubbing or edema b/l.    NEUROLOGIC: nonfocal PSYCHIATRIC:  patient is alert and oriented x 3.  SKIN: No obvious rash, lesion, or ulcer.   LABORATORY PANEL:  CBC Recent Labs  Lab 08/06/21 0539  WBC 9.3  HGB 10.6*  HCT 31.6*  PLT 108*     Chemistries  Recent Labs  Lab 08/05/21 1533 08/05/21 1643 08/06/21 0539  NA 133*  --  134*  K 3.2*  --  3.1*  CL 102  --  106  CO2 21*  --  22  GLUCOSE 116*  --  90  BUN 40*  --  23  CREATININE 1.91*  --  0.83  CALCIUM 8.7*  --  8.3*  MG  --  1.5*  --   AST 27  --   --   ALT 17  --   --   ALKPHOS 49  --   --   BILITOT 1.1  --   --     Cardiac Enzymes No results for input(s): TROPONINI in the last 168 hours. RADIOLOGY:  DG Chest Port 1 View  Result Date: 08/05/2021 CLINICAL DATA:  Questionable sepsis. EXAM: PORTABLE CHEST 1 VIEW.  Patient is rotated. COMPARISON:  Chest x-ray 06/22/2021, CT chest 12/31/2020. CT chest 12/31/2020 FINDINGS: The heart and mediastinal contours are unchanged.  Aortic calcification. Interval development of patchy airspace opacities along the right middle lobe. No definite associated cavitary lesion. Stable calcified subcentimeter nodule at the right apex. Chronic coarsened interstitial markings with no overt pulmonary edema. No pleural effusion. No pneumothorax. No acute osseous abnormality. IMPRESSION: 1. Interval development of patchy airspace opacities along the right middle lobe which could represent a combination of infection/inflammation versus atelectasis. Followup PA and lateral chest X-ray is recommended in 3-4 weeks following therapy to ensure resolution and exclude  underlying malignancy. 2. Aortic Atherosclerosis (ICD10-I70.0) and Emphysema (ICD10-J43.9). Electronically Signed   By: Iven Finn M.D.   On: 08/05/2021 16:01   ASSESSMENT AND PLAN:   [name] is a 65 y.o. female with medical history significant of hypertension, hyperlipidemia, COPD, asthma, tobacco abuse, alcohol abuse, PVD, CAD, myocardial infarction, carotid artery stenosis, abscess of lung, GI bleeding, iron deficiency anemia, who presents with lightheadedness and dizziness, cough, shortness breath, diarrhea.  Patient was recently hospitalized from 10/29 -11/2 due to COPD exacerbation and Flu A infection.  Sepsis sepsis due to HCAP and COPD exacerbation:  Streptococcal Pneumonia bacterimia Patient was initially hypotensive with blood pressure down to 46/30, which improved with IV fluid resuscitation, improved to 120/68 after giving total of 3.25 L of IV fluid bolus.  Chest x-ray showed right middle lobe infiltration.  --Lactic acid 2.0--1.5  - Solu-Medrol 40 mg twice daily - Incentive spirometry - Mucinex for cough  - Bronchodilators - Follow up blood culture x2, sputum culture  - Procalcitonin  60.68 --Cont Rocephin and zithromax  --BCID positive for strep pneumonia --12/5-- overall feeling better. Currently on room air. White count normal 9.3  hypercholesterolemia -Zetia, lipitor -pt is on Repatha   Essential hypertension -will resume bp meds since it is trending up   Tobacco use disorder and alcohol abuse -Nicotine patch -CiWA protocol -Did counseling about importance of quitting smoking and drinking alcohol   Coronary artery disease due to lipid rich plaque and elevated troponin: Troponin level 20, most likely due to demand ischemia -Continue aspirin, Lipitor -Trend troponin--flat   AKI (acute kidney injury) ([place]): Likely due to dehydration and possible ATN secondary to hypotension --resolved after IVF   Thrombocytopenia ([place]): This is chronic issue.  Platelet  114 (125 on 07/05/2021).  Etiology is not clear, may be related to alcohol abuse   Hypokalemia: Potassium 3.2 -Repleted potassium -pharmacy to replete   Iron deficiency anemia: Hemoglobin stable, 12.0. -Continue iron supplement   Diarrhea: -IV fluid as above -Check C. difficile and GI pathogen panel--stool yet to be sent  -- Per RN patient has had soft brown stool.            DVT ppx: SQ Lovenox Code Status: Full code Family Communication:  Yes, patient's niece  Disposition Plan:  Anticipate discharge back to previous environment Consults called:  none Level of care: Progressive Status is: Inpatient  Remains inpatient appropriate because: sepsis due to pneumonia  if continues to show improvement will discharged tomorrow       TOTAL TIME TAKING CARE OF THIS PATIENT: 30 minutes.  >50% time spent on counselling and coordination of care  Note: This dictation was prepared with Dragon dictation along with smaller phrase technology. Any transcriptional errors that result from this process are unintentional.  Fritzi Mandes M.D    [facility]   CC: Primary care physician; Lavera Guise, MD Patient ID: [name], female   DOB: [birth year], 65 y.o.   MRN: [id number]

## 2021-08-08 LAB — PROCALCITONIN: Procalcitonin: 13.17 ng/mL

## 2021-08-08 LAB — CULTURE, BLOOD (ROUTINE X 2): Special Requests: ADEQUATE

## 2021-08-08 LAB — BASIC METABOLIC PANEL
Anion gap: 6 (ref 5–15)
BUN: 20 mg/dL (ref 8–23)
CO2: 26 mmol/L (ref 22–32)
Calcium: 9.4 mg/dL (ref 8.9–10.3)
Chloride: 105 mmol/L (ref 98–111)
Creatinine, Ser: 0.74 mg/dL (ref 0.44–1.00)
GFR, Estimated: >60 mL/min (ref >60)
Glucose, Bld: 81 mg/dL (ref 70–99)
Potassium: 2.8 mmol/L — ABNORMAL LOW (ref 3.5–5.1)
Sodium: 137 mmol/L (ref 135–145)

## 2021-08-08 LAB — CULTURE, RESPIRATORY W GRAM STAIN: Culture: NORMAL

## 2021-08-08 LAB — MAGNESIUM: Magnesium: 1.8 mg/dL (ref 1.7–2.4)

## 2021-08-08 LAB — PHOSPHORUS: Phosphorus: 3.1 mg/dL (ref 2.5–4.6)

## 2021-08-08 MED ORDER — BENZONATATE 100 MG PO CAPS
100.0000 mg | ORAL_CAPSULE | Freq: Once | ORAL | Status: AC
  Administered 2021-08-08: 100 mg via ORAL
  Filled 2021-08-08: qty 1

## 2021-08-08 MED ORDER — POTASSIUM CHLORIDE 10 MEQ/100ML IV SOLN
10.0000 meq | INTRAVENOUS | Status: DC
  Administered 2021-08-08: 10 meq via INTRAVENOUS
  Filled 2021-08-08 (×4): qty 100

## 2021-08-08 MED ORDER — MAGNESIUM SULFATE 2 GM/50ML IV SOLN
2.0000 g | Freq: Once | INTRAVENOUS | Status: AC
  Administered 2021-08-08: 2 g via INTRAVENOUS
  Filled 2021-08-08: qty 50

## 2021-08-08 MED ORDER — POTASSIUM CHLORIDE CRYS ER 20 MEQ PO TBCR
40.0000 meq | EXTENDED_RELEASE_TABLET | ORAL | Status: AC
  Administered 2021-08-08: 40 meq via ORAL
  Filled 2021-08-08: qty 2

## 2021-08-08 MED ORDER — IPRATROPIUM-ALBUTEROL 0.5-2.5 (3) MG/3ML IN SOLN
3.0000 mL | Freq: Two times a day (BID) | RESPIRATORY_TRACT | Status: DC
  Administered 2021-08-08: 3 mL via RESPIRATORY_TRACT
  Filled 2021-08-08: qty 3

## 2021-08-08 NOTE — Progress Notes (Signed)
[place] [facility] [place] NAME: [name]    MR#:  [id number]  DATE OF BIRTH:  Apr 02, 1956  SUBJECTIVE:  came in with increasing shortness of breath and cough with productive phlegm and generalized weakness found to have pneumonia. She was admitted and of October with influenza A. Continues to smoke at home. Does not wear oxygen chronically.  Patient complains of burning at IV site due to IV potassium. She is feeling weak. Remains on room air. No family at bedside REVIEW OF SYSTEMS:   Review of Systems  Constitutional:  Positive for malaise/fatigue. Negative for chills, fever and weight loss.  HENT:  Negative for ear discharge, ear pain and nosebleeds.   Eyes:  Negative for blurred vision, pain and discharge.  Respiratory:  Positive for cough, sputum production and shortness of breath. Negative for wheezing and stridor.   Cardiovascular:  Negative for chest pain, palpitations, orthopnea and PND.  Gastrointestinal:  Negative for abdominal pain, diarrhea, nausea and vomiting.  Genitourinary:  Negative for frequency and urgency.  Musculoskeletal:  Negative for back pain and joint pain.  Neurological:  Positive for weakness. Negative for sensory change, speech change and focal weakness.  Psychiatric/Behavioral:  Negative for depression and hallucinations. The patient is not nervous/anxious.   Tolerating Diet:yes Tolerating PT: NO PT f/u needed  DRUG ALLERGIES:   Allergies  Allergen Reactions  . Aspirin Nausea And Vomiting and Other (See Comments)    Pt states aspirin makes her cramp and have to use coated kind.  . Tramadol   . Zyrtec [Cetirizine]     VITALS:  Blood pressure (!) 143/91, pulse 75, temperature 98.3 F (36.8 C), resp. rate 20, weight 49.9 kg, SpO2 96 %.  PHYSICAL EXAMINATION:   Physical Exam  GENERAL:  65 y.o.-year-old patient lying in the bed with no acute distress.  HEENT: Head atraumatic, normocephalic. Oropharynx and  nasopharynx clear.  LUNGS:decreased breath sounds bilaterally, no wheezing, rales, rhonchi. No use of accessory muscles of respiration.  CARDIOVASCULAR: S1, S2 normal. No murmurs, rubs, or gallops.  ABDOMEN: Soft, nontender, nondistended. Bowel sounds present. No organomegaly or mass.  EXTREMITIES: No cyanosis, clubbing or edema b/l.    NEUROLOGIC: nonfocal PSYCHIATRIC:  patient is alert and oriented x 3.  SKIN: No obvious rash, lesion, or ulcer.   LABORATORY PANEL:  CBC Recent Labs  Lab 08/06/21 0539  WBC 9.3  HGB 10.6*  HCT 31.6*  PLT 108*     Chemistries  Recent Labs  Lab 08/05/21 1533 08/05/21 1643 08/08/21 0533  NA 133*   < > 137  K 3.2*   < > 2.8*  CL 102   < > 105  CO2 21*   < > 26  GLUCOSE 116*   < > 81  BUN 40*   < > 20  CREATININE 1.91*   < > 0.74  CALCIUM 8.7*   < > 9.4  MG  --    < > 1.8  AST 27  --   --   ALT 17  --   --   ALKPHOS 49  --   --   BILITOT 1.1  --   --    < > = values in this interval not displayed.    Cardiac Enzymes No results for input(s): TROPONINI in the last 168 hours. RADIOLOGY:  No results found. ASSESSMENT AND PLAN:   Fronnie Urton is a 65 y.o. female with medical history significant of hypertension, hyperlipidemia, COPD, asthma,  tobacco abuse, alcohol abuse, PVD, CAD, myocardial infarction, carotid artery stenosis, abscess of lung, GI bleeding, iron deficiency anemia, who presents with lightheadedness and dizziness, cough, shortness breath, diarrhea.  Patient was recently hospitalized from 10/29 -11/2 due to COPD exacerbation and Flu A infection.  Sepsis sepsis due to HCAP and COPD exacerbation:  Streptococcal Pneumonia bacterimia Patient was initially hypotensive with blood pressure down to 46/30, which improved with IV fluid resuscitation, improved to 120/68 after giving total of 3.25 L of IV fluid bolus.  Chest x-ray showed right middle lobe infiltration.  --Lactic acid 2.0--1.5  - Solu-Medrol 40 mg twice daily -  Incentive spirometry - Mucinex for cough  - Bronchodilators - Follow up blood culture x2, sputum culture  - Procalcitonin  60.68 --Cont Rocephin   --BCID positive for strep pneumonia --12/5-- overall feeling better. Currently on room air. White count normal 9.3 --12/6-- feels week. Will continue IV antibiotic change to oral cephalosporins from tomorrow.  hypercholesterolemia -Zetia, lipitor -pt is on Repatha   Essential hypertension -will resume bp meds since it is trending up   Tobacco use disorder and alcohol abuse -Nicotine patch -CiWA protocol -Did counseling about importance of quitting smoking and drinking alcohol   Coronary artery disease due to lipid rich plaque and elevated troponin: Troponin level 20, most likely due to demand ischemia -Continue aspirin, Lipitor -Trend troponin--flat   AKI (acute kidney injury) ([place]): Likely due to dehydration and possible ATN secondary to hypotension --resolved after IVF   Thrombocytopenia ([place]): This is chronic issue.  Platelet 114 (125 on 07/05/2021).  Etiology is not clear, may be related to alcohol abuse   Hypokalemia: Potassium 3.2 -Repleted potassium -pharmacy to replete   Iron deficiency anemia: Hemoglobin stable, 12.0. -Continue iron supplement   Diarrhea: -IV fluid as above -Check C. difficile and GI pathogen panel--stool yet to be sent  -- Per RN patient has had soft brown stool.    Hypokalemia -- replace potassium orally. Check labs in the morning.       DVT ppx: SQ Lovenox Code Status: Full code Family Communication:  Yes, patient's niece  Disposition Plan:  Anticipate discharge back to previous environment Consults called:  none Level of care: Progressive Status is: Inpatient  Remains inpatient appropriate because: sepsis due to pneumonia  if continues to show improvement will discharged tomorrow       TOTAL TIME TAKING CARE OF THIS PATIENT: 25 minutes.  >50% time spent on counselling and  coordination of care  Note: This dictation was prepared with Dragon dictation along with smaller phrase technology. Any transcriptional errors that result from this process are unintentional.  Fritzi Mandes M.D    [facility]   CC: Primary care physician; Lavera Guise, MD Patient ID: [name], female   DOB: September 07, 1955, 65 y.o.   MRN: [id number]

## 2021-08-08 NOTE — Progress Notes (Addendum)
PHARMACY CONSULT NOTE  Pharmacy Consult for Electrolyte Monitoring and Replacement   Recent Labs: Potassium (mmol/L)  Date Value  08/08/2021 2.8 (L)  10/15/2013 3.6   Magnesium (mg/dL)  Date Value  08/08/2021 1.8   Calcium (mg/dL)  Date Value  08/08/2021 9.4   Calcium, Total (mg/dL)  Date Value  10/15/2013 9.1   Albumin (g/dL)  Date Value  08/05/2021 3.2 (L)  11/11/2017 4.9 (H)   Phosphorus (mg/dL)  Date Value  08/08/2021 3.1   Sodium (mmol/L)  Date Value  08/08/2021 137  11/11/2017 139  10/15/2013 131 (L)   Assessment: 65 y.o. female w/ PMH of HTN, HLD, COPD, asthma, EtOH abuse, PVD, CAD, MI who presents with lightheadedness and dizziness, cough, shortness breath, diarrhea. Currently undergoing treatment for pneumonia and bacteremia. Pharmacy is asked to follow and replace electrolytes  Magnesium and potassium remain below goal range. Noted potassium worsened since yesterday. Phos, Na and Ca remain within normal range.  Goal of Therapy:  Electrolytes WNL  Plan:  Replace Mg with MgSulfate 2gm IV x 1 Kcl 17meq IVPB q1hrs x 4 doses and recheck potassium 1hrs after last dose given. Will continue to monitor electrolytes with AM labs and replace as needed.  Tiago Humphrey Rodriguez-Guzman PharmD, BCPS 08/08/2021 7:35 AM   12/6 2 1008 - Kcl IVPB is burning. Will replace with oral Kcl 22meq per MDs request and stop IV potassium replacement. Check K with AM labs  Jenner Rosier Rodriguez-Guzman PharmD, BCPS 08/08/2021 10:12 AM

## 2021-08-08 NOTE — Progress Notes (Addendum)
Secure chat sent to Roderic Scarce MD for patient to have an order for tessalon pearls. Patient continues to have frequent coughing spells. Nonproductive. Robitussin and mucinex has been given but has not helped much.  46- New order noted

## 2021-08-08 NOTE — Care Management Important Message (Signed)
Important Message  Patient Details  Name: [name] MRN: [id number] Date of Birth: 04/09/1956   Medicare Important Message Given:  Yes     Dannette Barbara 08/08/2021, 12:18 PM

## 2021-08-09 ENCOUNTER — Encounter: Payer: Self-pay | Admitting: Internal Medicine

## 2021-08-09 LAB — BASIC METABOLIC PANEL
Anion gap: 8 (ref 5–15)
BUN: 15 mg/dL (ref 8–23)
CO2: 26 mmol/L (ref 22–32)
Calcium: 9.2 mg/dL (ref 8.9–10.3)
Chloride: 102 mmol/L (ref 98–111)
Creatinine, Ser: 0.72 mg/dL (ref 0.44–1.00)
GFR, Estimated: >60 mL/min (ref >60)
Glucose, Bld: 80 mg/dL (ref 70–99)
Potassium: 3.1 mmol/L — ABNORMAL LOW (ref 3.5–5.1)
Sodium: 136 mmol/L (ref 135–145)

## 2021-08-09 LAB — PHOSPHORUS: Phosphorus: 3.7 mg/dL (ref 2.5–4.6)

## 2021-08-09 LAB — POTASSIUM
Potassium: 3.1 mmol/L — ABNORMAL LOW (ref 3.5–5.1)
Potassium: 3.6 mmol/L (ref 3.5–5.1)

## 2021-08-09 LAB — MAGNESIUM: Magnesium: 1.8 mg/dL (ref 1.7–2.4)

## 2021-08-09 MED ORDER — BENZONATATE 100 MG PO CAPS: 100.0000 mg | ORAL_CAPSULE | Freq: Two times a day (BID) | ORAL | 0 refills | Status: DC | PRN

## 2021-08-09 MED ORDER — POTASSIUM CHLORIDE CRYS ER 20 MEQ PO TBCR
40.0000 meq | EXTENDED_RELEASE_TABLET | Freq: Once | ORAL | Status: AC
  Administered 2021-08-09: 40 meq via ORAL
  Filled 2021-08-09: qty 2

## 2021-08-09 MED ORDER — BENZONATATE 100 MG PO CAPS
100.0000 mg | ORAL_CAPSULE | Freq: Three times a day (TID) | ORAL | Status: AC | PRN
  Administered 2021-08-09 (×2): 100 mg via ORAL
  Filled 2021-08-09 (×2): qty 1

## 2021-08-09 MED ORDER — AMOXICILLIN 500 MG PO TABS: 500.0000 mg | ORAL_TABLET | Freq: Three times a day (TID) | ORAL | 0 refills | Status: AC

## 2021-08-09 MED ORDER — POTASSIUM CHLORIDE CRYS ER 20 MEQ PO TBCR
40.0000 meq | EXTENDED_RELEASE_TABLET | Freq: Once | ORAL | Status: AC
  Administered 2021-08-09: 40 meq via ORAL

## 2021-08-09 MED ORDER — ISOSORBIDE MONONITRATE ER 60 MG PO TB24: 60.0000 mg | ORAL_TABLET | Freq: Every day | ORAL | Status: DC

## 2021-08-09 NOTE — Progress Notes (Addendum)
Progress Note    [name]  NAT:[id number] DOB: [birth year]  DOA: 08/05/2021 PCP: Lavera Guise, MD      Brief Narrative:    Medical records reviewed and are as summarized below:  [name] is a 65 y.o. female with medical history significant for hypertension, hyperlipidemia, COPD, asthma, PVD, CAD with previous MI, carotid artery stenosis, lung abscess, GI bleed, iron deficiency anemia, tobacco use disorder, alcohol use disorder.  She presented to the hospital with lightheadedness, dizziness, cough, shortness of breath and diarrhea.      Assessment/Plan:   Principal Problem:   Severe sepsis (HCC) Active Problems:   H/O hypercholesterolemia   Essential hypertension   Tobacco use disorder   Alcohol abuse   Coronary artery disease due to lipid rich plaque   AKI (acute kidney injury) ([place])   COPD exacerbation (HCC)   Thrombocytopenia (HCC)   Hypokalemia   Elevated troponin   Iron deficiency anemia   Diarrhea   Community acquired pneumonia    Body mass index is 18.88 kg/m.  Severe sepsis secondary to streptococcal pneumoniae community-acquired pneumonia and bacteremia: Continue IV Rocephin.  Plan to switch to amoxicillin tomorrow.  COPD exacerbation: Improved.  Continue bronchodilators  Hypokalemia: Replete potassium and monitor levels  Diarrhea and AKI: Improved  Mildly elevated but flat troponins, CAD: Elevated troponins likely due to demand ischemia  Thrombocytopenia: Platelet count is stable.  Other comorbidities include iron deficiency anemia, tobacco use disorder, alcohol use disorder  Initially, patient was discharged because she said she was ready for discharge.  However, she changed her mind and says she did not feel well enough to go home today.  Discharge was cancelled with plan to discharge home tomorrow.   Diet Order             Diet - low sodium heart healthy           Diet Heart Room service appropriate? Yes; Fluid consistency:  Thin  Diet effective now                      Consultants: None  Procedures: None    Medications:    aspirin EC  81 mg Oral Daily   atorvastatin  80 mg Oral Daily   cholecalciferol  500 Units Oral Daily   enoxaparin (LOVENOX) injection  40 mg Subcutaneous Q24H   ezetimibe  10 mg Oral Daily   ferrous sulfate  325 mg Oral Daily   folic acid  1 mg Oral Daily   isosorbide mononitrate  30 mg Oral Daily   losartan  50 mg Oral Daily   mometasone-formoterol  2 puff Inhalation BID   multivitamin with minerals  1 tablet Oral Daily   nicotine  21 mg Transdermal Daily   sodium chloride flush  10 mL Intravenous Q12H   thiamine  100 mg Oral Daily   Continuous Infusions:  cefTRIAXone (ROCEPHIN)  IV 2 g (08/09/21 1101)     Anti-infectives (From admission, onward)    Start     Dose/Rate Route Frequency Ordered Stop   08/09/21 0000  amoxicillin (AMOXIL) 500 MG tablet        500 mg Oral 3 times daily 08/09/21 1456 08/13/21 2359   08/06/21 2100  ceFEPIme (MAXIPIME) 2 g in sodium chloride 0.9 % 100 mL IVPB  Status:  Discontinued        2 g 200 mL/hr over 30 Minutes Intravenous Every 24 hours 08/06/21 0316 08/06/21 0716  08/06/21 1400  azithromycin (ZITHROMAX) tablet 500 mg  Status:  Discontinued        500 mg Oral Daily 08/06/21 1122 08/08/21 1006   08/06/21 1200  cefTRIAXone (ROCEPHIN) 2 g in sodium chloride 0.9 % 100 mL IVPB        2 g 200 mL/hr over 30 Minutes Intravenous Every 24 hours 08/06/21 1038     08/05/21 1730  vancomycin (VANCOCIN) IVPB 1000 mg/200 mL premix        1,000 mg 200 mL/hr over 60 Minutes Intravenous  Once 08/05/21 1658 08/06/21 0030   08/05/21 1722  vancomycin variable dose per unstable renal function (pharmacist dosing)  Status:  Discontinued         Does not apply See admin instructions 08/05/21 1722 08/06/21 0716   08/05/21 1700  ceFEPIme (MAXIPIME) 2 g in sodium chloride 0.9 % 100 mL IVPB        2 g 200 mL/hr over 30 Minutes Intravenous  Once  08/05/21 1658 08/05/21 2210   08/05/21 1615  cefTRIAXone (ROCEPHIN) 2 g in sodium chloride 0.9 % 100 mL IVPB  Status:  Discontinued        2 g 200 mL/hr over 30 Minutes Intravenous Every 24 hours 08/05/21 1605 08/05/21 1640   08/05/21 1615  azithromycin (ZITHROMAX) 500 mg in sodium chloride 0.9 % 250 mL IVPB  Status:  Discontinued        500 mg 250 mL/hr over 60 Minutes Intravenous Every 24 hours 08/05/21 1605 08/05/21 1640              Family Communication/Anticipated D/C date and plan/Code Status   DVT prophylaxis: enoxaparin (LOVENOX) injection 40 mg Start: 08/07/21 1700     Code Status: Full Code  Family Communication: Plan discussed with her significant other at the bedside Disposition Plan: Plan to discharge home tomorrow   Status is: Inpatient  Remains inpatient appropriate because: on IV antibiotics.           Subjective:   C/o cough.  Breathing is better.  No diarrhea.  Objective:    Vitals:   08/09/21 0330 08/09/21 0757 08/09/21 1121 08/09/21 1340  BP: (!) 158/75 114/86 (!) 81/60 98/73  Pulse: 74 74 83   Resp: 20 16 18    Temp: 98.8 F (37.1 C) 98.2 F (36.8 C) 97.9 F (36.6 C)   TempSrc:  Oral Oral   SpO2: 93% 96% 97%   Weight:       No data found.   Intake/Output Summary (Last 24 hours) at 08/09/2021 1521 Last data filed at 08/09/2021 1357 Gross per 24 hour  Intake 840 ml  Output --  Net 840 ml   Filed Weights   08/05/21 1659  Weight: 49.9 kg    Exam:  GEN: NAD SKIN: No rash EYES: EOMI ENT: MMM CV: RRR PULM: CTA B ABD: soft, ND, NT, +BS CNS: AAO x 3, non focal EXT: No edema or tenderness        Data Reviewed:   I have personally reviewed following labs and imaging studies:  Labs: Labs show the following:   Basic Metabolic Panel: Recent Labs  Lab 08/05/21 1533 08/05/21 1643 08/05/21 1736 08/06/21 0539 08/08/21 0533 08/09/21 0237 08/09/21 1406  NA 133*  --   --  134* 137 136  --   K 3.2*  --   --   3.1* 2.8* 3.1*  3.1* 3.6  CL 102  --   --  106 105 102  --  CO2 21*  --   --  22 26 26   --   GLUCOSE 116*  --   --  90 81 80  --   BUN 40*  --   --  23 20 15   --   CREATININE 1.91*  --   --  0.83 0.74 0.72  --   CALCIUM 8.7*  --   --  8.3* 9.4 9.2  --   MG  --  1.5*  --   --  1.8 1.8  --   PHOS  --   --  3.6  --  3.1 3.7  --    GFR Estimated Creatinine Clearance: 55.2 mL/min (by C-G formula based on SCr of 0.72 mg/dL). Liver Function Tests: Recent Labs  Lab 08/05/21 1533  AST 27  ALT 17  ALKPHOS 49  BILITOT 1.1  PROT 6.9  ALBUMIN 3.2*   No results for input(s): LIPASE, AMYLASE in the last 168 hours. No results for input(s): AMMONIA in the last 168 hours. Coagulation profile Recent Labs  Lab 08/05/21 1533  INR 1.1    CBC: Recent Labs  Lab 08/05/21 1533 08/06/21 0539  WBC 14.0* 9.3  NEUTROABS 12.4*  --   HGB 12.0 10.6*  HCT 36.5 31.6*  MCV 98.6 97.5  PLT 114* 108*   Cardiac Enzymes: No results for input(s): CKTOTAL, CKMB, CKMBINDEX, TROPONINI in the last 168 hours. BNP (last 3 results) No results for input(s): PROBNP in the last 8760 hours. CBG: Recent Labs  Lab 08/07/21 1615  GLUCAP 171*   D-Dimer: No results for input(s): DDIMER in the last 72 hours. Hgb A1c: No results for input(s): HGBA1C in the last 72 hours. Lipid Profile: No results for input(s): CHOL, HDL, LDLCALC, TRIG, CHOLHDL, LDLDIRECT in the last 72 hours. Thyroid function studies: No results for input(s): TSH, T4TOTAL, T3FREE, THYROIDAB in the last 72 hours.  Invalid input(s): FREET3 Anemia work up: No results for input(s): VITAMINB12, FOLATE, FERRITIN, TIBC, IRON, RETICCTPCT in the last 72 hours. Sepsis Labs: Recent Labs  Lab 08/05/21 1533 08/05/21 1604 08/05/21 1643 08/05/21 1705 08/06/21 0539 08/08/21 0533  PROCALCITON  --   --  60.68  --   --  13.17  WBC 14.0*  --   --   --  9.3  --   LATICACIDVEN  --  2.0*  --  1.5  --   --     Microbiology Recent Results (from the  past 240 hour(s))  Blood Culture (routine x 2)     Status: Abnormal   Collection Time: 08/05/21  4:04 PM   Specimen: BLOOD  Result Value Ref Range Status   Specimen Description   Final    BLOOD LEFT HAND Performed at [facility], [address], [place], [place] [address]    Special Requests   Final    BOTTLES DRAWN AEROBIC AND ANAEROBIC Blood Culture adequate volume Performed at [facility], [address] [place], [place] [address]    Culture  Setup Time   Final    GRAM POSITIVE COCCI ANAEROBIC BOTTLE ONLY Organism ID to follow CRITICAL RESULT CALLED TO, READ BACK BY AND VERIFIED WITHLloyd Huger AT [id number] 08/06/2021 DLB Performed at [facility], [address] [place], [place] [address]    Culture STREPTOCOCCUS PNEUMONIAE (A)  Final   Report Status 08/08/2021 FINAL  Final   Organism ID, Bacteria STREPTOCOCCUS PNEUMONIAE  Final      Susceptibility   Streptococcus pneumoniae - MIC*  ERYTHROMYCIN <=0.12 SENSITIVE Sensitive     LEVOFLOXACIN 0.5 SENSITIVE Sensitive     VANCOMYCIN 0.5 SENSITIVE Sensitive     PENICILLIN (meningitis) <=0.06 SENSITIVE Sensitive     PENO - penicillin <=0.06      PENICILLIN (non-meningitis) <=0.06 SENSITIVE Sensitive     PENICILLIN (oral) <=0.06 SENSITIVE Sensitive     CEFTRIAXONE (non-meningitis) <=0.12 SENSITIVE Sensitive     CEFTRIAXONE (meningitis) <=0.12 SENSITIVE Sensitive     * STREPTOCOCCUS PNEUMONIAE  Resp Panel by RT-PCR (Flu A&B, Covid) Nasopharyngeal Swab     Status: None   Collection Time: 08/05/21  4:04 PM   Specimen: Nasopharyngeal Swab; Nasopharyngeal(NP) swabs in vial transport medium  Result Value Ref Range Status   SARS Coronavirus 2 by RT PCR NEGATIVE NEGATIVE Final    Comment: (NOTE) SARS-CoV-2 target nucleic acids are NOT DETECTED.  The SARS-CoV-2 RNA is generally detectable in upper respiratory specimens during the acute phase of infection. The lowest concentration of SARS-CoV-2 viral  copies this assay can detect is 138 copies/mL. A negative result does not preclude SARS-Cov-2 infection and should not be used as the sole basis for treatment or other patient management decisions. A negative result may occur with  improper specimen collection/handling, submission of specimen other than nasopharyngeal swab, presence of viral mutation(s) within the areas targeted by this assay, and inadequate number of viral copies(<138 copies/mL). A negative result must be combined with clinical observations, patient history, and epidemiological information. The expected result is Negative.  Fact Sheet for Patients:  EntrepreneurPulse.com.au  Fact Sheet for Healthcare Providers:  IncredibleEmployment.be  This test is no t yet approved or cleared by the [place] FDA and  has been authorized for detection and/or diagnosis of SARS-CoV-2 by FDA under an Emergency Use Authorization (EUA). This EUA will remain  in effect (meaning this test can be used) for the duration of the COVID-19 declaration under Section 564(b)(1) of the Act, 21 U.S.C.section 360bbb-3(b)(1), unless the authorization is terminated  or revoked sooner.       Influenza A by PCR NEGATIVE NEGATIVE Final   Influenza B by PCR NEGATIVE NEGATIVE Final    Comment: (NOTE) The Xpert Xpress SARS-CoV-2/FLU/RSV plus assay is intended as an aid in the diagnosis of influenza from Nasopharyngeal swab specimens and should not be used as a sole basis for treatment. Nasal washings and aspirates are unacceptable for Xpert Xpress SARS-CoV-2/FLU/RSV testing.  Fact Sheet for Patients: EntrepreneurPulse.com.au  Fact Sheet for Healthcare Providers: IncredibleEmployment.be  This test is not yet approved or cleared by the [place] FDA and has been authorized for detection and/or diagnosis of SARS-CoV-2 by FDA under an Emergency Use Authorization (EUA). This  EUA will remain in effect (meaning this test can be used) for the duration of the COVID-19 declaration under Section 564(b)(1) of the Act, 21 U.S.C. section 360bbb-3(b)(1), unless the authorization is terminated or revoked.  Performed at [facility], [address] [place], [place] [address]   Blood Culture ID Panel (Reflexed)     Status: Abnormal   Collection Time: 08/05/21  4:04 PM  Result Value Ref Range Status   Enterococcus faecalis NOT DETECTED NOT DETECTED Final   Enterococcus Faecium NOT DETECTED NOT DETECTED Final   Listeria monocytogenes NOT DETECTED NOT DETECTED Final   Staphylococcus species NOT DETECTED NOT DETECTED Final   Staphylococcus aureus (BCID) NOT DETECTED NOT DETECTED Final   Staphylococcus epidermidis NOT DETECTED NOT DETECTED Final   Staphylococcus lugdunensis NOT DETECTED NOT DETECTED Final   Streptococcus  species DETECTED (A) NOT DETECTED Final    Comment: CRITICAL RESULT CALLED TO, READ BACK BY AND VERIFIED WITH: NATHAN BELUE AT 0612 08/06/2021 DLB    Streptococcus agalactiae NOT DETECTED NOT DETECTED Final   Streptococcus pneumoniae DETECTED (A) NOT DETECTED Final    Comment: CRITICAL RESULT CALLED TO, READ BACK BY AND VERIFIED WITH: NATHAN BELUE AT 0612 08/06/2021 DLB    Streptococcus pyogenes NOT DETECTED NOT DETECTED Final   A.calcoaceticus-baumannii NOT DETECTED NOT DETECTED Final   Bacteroides fragilis NOT DETECTED NOT DETECTED Final   Enterobacterales NOT DETECTED NOT DETECTED Final   Enterobacter cloacae complex NOT DETECTED NOT DETECTED Final   Escherichia coli NOT DETECTED NOT DETECTED Final   Klebsiella aerogenes NOT DETECTED NOT DETECTED Final   Klebsiella oxytoca NOT DETECTED NOT DETECTED Final   Klebsiella pneumoniae NOT DETECTED NOT DETECTED Final   Proteus species NOT DETECTED NOT DETECTED Final   Salmonella species NOT DETECTED NOT DETECTED Final   Serratia marcescens NOT DETECTED NOT DETECTED Final   Haemophilus  influenzae NOT DETECTED NOT DETECTED Final   Neisseria meningitidis NOT DETECTED NOT DETECTED Final   Pseudomonas aeruginosa NOT DETECTED NOT DETECTED Final   Stenotrophomonas maltophilia NOT DETECTED NOT DETECTED Final   Candida albicans NOT DETECTED NOT DETECTED Final   Candida auris NOT DETECTED NOT DETECTED Final   Candida glabrata NOT DETECTED NOT DETECTED Final   Candida krusei NOT DETECTED NOT DETECTED Final   Candida parapsilosis NOT DETECTED NOT DETECTED Final   Candida tropicalis NOT DETECTED NOT DETECTED Final   Cryptococcus neoformans/gattii NOT DETECTED NOT DETECTED Final    Comment: Performed at [facility], [address] [place], [place] [address]  Blood Culture (routine x 2)     Status: None (Preliminary result)   Collection Time: 08/05/21  4:20 PM   Specimen: BLOOD  Result Value Ref Range Status   Specimen Description BLOOD LEFT FA  Final   Special Requests   Final    BOTTLES DRAWN AEROBIC AND ANAEROBIC Blood Culture results may not be optimal due to an inadequate volume of blood received in culture bottles   Culture   Final    NO GROWTH 4 DAYS Performed at [facility], [address] [place], [place] [address]    Report Status PENDING  Incomplete  Urine Culture     Status: None   Collection Time: 08/05/21  5:05 PM   Specimen: Urine, Random  Result Value Ref Range Status   Specimen Description   Final    URINE, RANDOM Performed at [facility], [address] [place], [place] [address]    Special Requests   Final    NONE Performed at [facility], [address], [place], [place] [address]    Culture   Final    NO GROWTH Performed at [facility], [address] [place], [place] [address]    Report Status 08/07/2021 FINAL  Final  Expectorated Sputum Assessment w Gram Stain, Rflx to Resp Cult     Status: None   Collection Time: 08/06/21  9:33 AM   Specimen: Sputum  Result Value Ref Range Status    Specimen Description SPUTUM  Final   Special Requests NONE  Final   Sputum evaluation   Final    THIS SPECIMEN IS ACCEPTABLE FOR SPUTUM CULTURE Performed at [facility], [address], [place],  [address]    Report Status 08/06/2021 FINAL  Final  Culture, Respiratory w Gram Stain  Status: None   Collection Time: 08/06/21  9:33 AM   Specimen: SPU  Result Value Ref Range Status   Specimen Description   Final    SPUTUM Performed at [facility], [address] [place], [place] [address]    Special Requests   Final    NONE Reflexed from [id number] Performed at [facility], [address] [place], [place] [address]    Gram Stain   Final    FEW SQUAMOUS EPITHELIAL CELLS PRESENT FEW WBC SEEN FEW GRAM POSITIVE COCCI    Culture   Final    RARE Normal respiratory flora-no Staph aureus or Pseudomonas seen Performed at [facility], [place] [address], [place], [place] [address]    Report Status 08/08/2021 FINAL  Final    Procedures and diagnostic studies:  No results found.             LOS: 4 days   Rosibel Giacobbe  [facility]   Pager on [email or web address]. If 7PM-7AM, please contact night-coverage at [email or web address]     08/09/2021, 3:21 PM

## 2021-08-09 NOTE — Progress Notes (Signed)
PHARMACY CONSULT NOTE  Pharmacy Consult for Electrolyte Monitoring and Replacement   Recent Labs: Potassium (mmol/L)  Date Value  08/09/2021 3.1 (L)  10/15/2013 3.6   Magnesium (mg/dL)  Date Value  08/08/2021 1.8   Calcium (mg/dL)  Date Value  08/08/2021 9.4   Calcium, Total (mg/dL)  Date Value  10/15/2013 9.1   Albumin (g/dL)  Date Value  08/05/2021 3.2 (L)  11/11/2017 4.9 (H)   Phosphorus (mg/dL)  Date Value  08/08/2021 3.1   Sodium (mmol/L)  Date Value  08/08/2021 137  11/11/2017 139  10/15/2013 131 (L)   Assessment: 65 y.o. female w/ PMH of HTN, HLD, COPD, asthma, EtOH abuse, PVD, CAD, MI who presents with lightheadedness and dizziness, cough, shortness breath, diarrhea. Pharmacy consulted for electrolyte replacement.   Goal of Therapy:  Electrolytes WNL  Plan:  K 3.1 - pt experienced burning with IV Kcl 12/6; will replace with 40 mEq PO Kcl x 1 dose Will continue to monitor electrolytes with AM labs and replace as needed.  Sherilyn Banker, PharmD Clinical Pharmacist 08/09/2021 7:42 AM

## 2021-08-10 LAB — CULTURE, BLOOD (ROUTINE X 2): Culture: NO GROWTH

## 2021-08-10 LAB — BASIC METABOLIC PANEL
Anion gap: 9 (ref 5–15)
BUN: 17 mg/dL (ref 8–23)
CO2: 23 mmol/L (ref 22–32)
Calcium: 9.4 mg/dL (ref 8.9–10.3)
Chloride: 104 mmol/L (ref 98–111)
Creatinine, Ser: 0.74 mg/dL (ref 0.44–1.00)
GFR, Estimated: >60 mL/min (ref >60)
Glucose, Bld: 91 mg/dL (ref 70–99)
Potassium: 4.2 mmol/L (ref 3.5–5.1)
Sodium: 136 mmol/L (ref 135–145)

## 2021-08-10 LAB — MAGNESIUM: Magnesium: 1.8 mg/dL (ref 1.7–2.4)

## 2021-08-10 LAB — PHOSPHORUS: Phosphorus: 4.3 mg/dL (ref 2.5–4.6)

## 2021-08-10 MED ORDER — MAGNESIUM SULFATE 2 GM/50ML IV SOLN
2.0000 g | INTRAVENOUS | Status: AC
  Administered 2021-08-10: 2 g via INTRAVENOUS
  Filled 2021-08-10: qty 50

## 2021-08-10 NOTE — Progress Notes (Signed)
PHARMACY CONSULT NOTE  Pharmacy Consult for Electrolyte Monitoring and Replacement   Recent Labs: Potassium (mmol/L)  Date Value  08/10/2021 4.2  10/15/2013 3.6   Magnesium (mg/dL)  Date Value  08/10/2021 1.8   Calcium (mg/dL)  Date Value  08/10/2021 9.4   Calcium, Total (mg/dL)  Date Value  10/15/2013 9.1   Albumin (g/dL)  Date Value  08/05/2021 3.2 (L)  11/11/2017 4.9 (H)   Phosphorus (mg/dL)  Date Value  08/10/2021 4.3   Sodium (mmol/L)  Date Value  08/10/2021 136  11/11/2017 139  10/15/2013 131 (L)   Assessment: 65 y.o. female w/ PMH of HTN, HLD, COPD, asthma, EtOH abuse, PVD, CAD, MI who presents with lightheadedness and dizziness, cough, shortness breath, diarrhea. Pharmacy consulted for electrolyte replacement.   Goal of Therapy:  Electrolytes WNL  Plan:  Potassium at gaol. Mg at 1.8, will replace with MgSulfate 2gm x 1 dose. Will continue to monitor electrolytes with AM labs and replace as needed.  [name] PharmD, BCPS 08/10/2021 7:28 AM

## 2021-08-10 NOTE — Discharge Summary (Signed)
Physician Discharge Summary  [name] HLK:[id number] DOB: November 21, 1955 DOA: 08/05/2021  PCP: Lavera Guise, MD  Admit date: 08/05/2021 Discharge date: 08/10/2021  Discharge disposition: Home   Recommendations for Outpatient Follow-Up:   Follow up with PCP in 1 week  Discharge Diagnosis:   Principal Problem:   Severe sepsis ([place]) Active Problems:   H/O hypercholesterolemia   Essential hypertension   Tobacco use disorder   Alcohol abuse   Coronary artery disease due to lipid rich plaque   AKI (acute kidney injury) ([facility])   COPD exacerbation ([facility])   Thrombocytopenia ([facility])   Hypokalemia   Elevated troponin   Iron deficiency anemia   Diarrhea   Community acquired pneumonia    Discharge Condition: Stable.  Diet recommendation:  Diet Order             Diet - low sodium heart healthy           Diet Heart Room service appropriate? Yes; Fluid consistency: Thin  Diet effective now                     Code Status: Full Code     Hospital Course:   Ms. [name] is a 65 y.o. female with medical history significant for hypertension, hyperlipidemia, COPD, asthma, PVD, CAD with previous MI, carotid artery stenosis, lung abscess, GI bleed, iron deficiency anemia, tobacco use disorder, alcohol use disorder.  She presented to the hospital with lightheadedness, dizziness, cough, shortness of breath and diarrhea.   She was found to have severe sepsis secondary to community-acquired pneumonia and bacteremia.  She was treated with empiric IV antibiotics.  Blood culture revealed streptococcal pneumoniae consistent with strep pneumonia bacteremia.  She also had diarrhea and AKI improved with IV fluids.  She had hypokalemia that was repleted.  Troponins were mildly elevated but these were attributed to demand ischemia.  Her condition has improved and she is deemed stable for discharge home today.  She has been advised to avoid alcohol and cigarette smoking.     Discharge  Exam:    Vitals:   08/09/21 1547 08/09/21 1852 08/09/21 2304 08/10/21 0317  BP: 96/76 (!) 144/81 132/76 (!) 143/82  Pulse: 80 89 88 83  Resp: 16 17 18 18   Temp: 97.6 F (36.4 C) 97.8 F (36.6 C) 98.2 F (36.8 C) 98.7 F (37.1 C)  TempSrc: Oral  Oral Oral  SpO2: 94% 97% 92% 96%  Weight:         GEN: NAD SKIN: No rash EYES: EOMI ENT: MMM CV: RRR PULM: CTA B ABD: soft, ND, NT, +BS CNS: AAO x 3, non focal EXT: No edema or tenderness   The results of significant diagnostics from this hospitalization (including imaging, microbiology, ancillary and laboratory) are listed below for reference.     Procedures and Diagnostic Studies:   DG Chest Port 1 View  Result Date: 08/05/2021 CLINICAL DATA:  Questionable sepsis. EXAM: PORTABLE CHEST 1 VIEW.  Patient is rotated. COMPARISON:  Chest x-ray 06/22/2021, CT chest 12/31/2020. CT chest 12/31/2020 FINDINGS: The heart and mediastinal contours are unchanged. Aortic calcification. Interval development of patchy airspace opacities along the right middle lobe. No definite associated cavitary lesion. Stable calcified subcentimeter nodule at the right apex. Chronic coarsened interstitial markings with no overt pulmonary edema. No pleural effusion. No pneumothorax. No acute osseous abnormality. IMPRESSION: 1. Interval development of patchy airspace opacities along the right middle lobe which could represent a combination of infection/inflammation versus atelectasis. Followup PA  and lateral chest X-ray is recommended in 3-4 weeks following therapy to ensure resolution and exclude underlying malignancy. 2. Aortic Atherosclerosis (ICD10-I70.0) and Emphysema (ICD10-J43.9). Electronically Signed   By: Iven Finn M.D.   On: 08/05/2021 16:01     Labs:   Basic Metabolic Panel: Recent Labs  Lab 08/05/21 1533 08/05/21 1643 08/05/21 1736 08/06/21 0539 08/08/21 0533 08/09/21 0237 08/09/21 1406 08/10/21 0439  NA 133*  --   --  134* 137 136  --   136  K 3.2*  --   --  3.1* 2.8* 3.1*  3.1* 3.6 4.2  CL 102  --   --  106 105 102  --  104  CO2 21*  --   --  22 26 26   --  23  GLUCOSE 116*  --   --  90 81 80  --  91  BUN 40*  --   --  23 20 15   --  17  CREATININE 1.91*  --   --  0.83 0.74 0.72  --  0.74  CALCIUM 8.7*  --   --  8.3* 9.4 9.2  --  9.4  MG  --  1.5*  --   --  1.8 1.8  --  1.8  PHOS  --   --  3.6  --  3.1 3.7  --  4.3   GFR Estimated Creatinine Clearance: 55.2 mL/min (by C-G formula based on SCr of 0.74 mg/dL). Liver Function Tests: Recent Labs  Lab 08/05/21 1533  AST 27  ALT 17  ALKPHOS 49  BILITOT 1.1  PROT 6.9  ALBUMIN 3.2*   No results for input(s): LIPASE, AMYLASE in the last 168 hours. No results for input(s): AMMONIA in the last 168 hours. Coagulation profile Recent Labs  Lab 08/05/21 1533  INR 1.1    CBC: Recent Labs  Lab 08/05/21 1533 08/06/21 0539  WBC 14.0* 9.3  NEUTROABS 12.4*  --   HGB 12.0 10.6*  HCT 36.5 31.6*  MCV 98.6 97.5  PLT 114* 108*   Cardiac Enzymes: No results for input(s): CKTOTAL, CKMB, CKMBINDEX, TROPONINI in the last 168 hours. BNP: Invalid input(s): POCBNP CBG: Recent Labs  Lab 08/07/21 1615  GLUCAP 171*   D-Dimer No results for input(s): DDIMER in the last 72 hours. Hgb A1c No results for input(s): HGBA1C in the last 72 hours. Lipid Profile No results for input(s): CHOL, HDL, LDLCALC, TRIG, CHOLHDL, LDLDIRECT in the last 72 hours. Thyroid function studies No results for input(s): TSH, T4TOTAL, T3FREE, THYROIDAB in the last 72 hours.  Invalid input(s): FREET3 Anemia work up No results for input(s): VITAMINB12, FOLATE, FERRITIN, TIBC, IRON, RETICCTPCT in the last 72 hours. Microbiology Recent Results (from the past 240 hour(s))  Blood Culture (routine x 2)     Status: Abnormal   Collection Time: 08/05/21  4:04 PM   Specimen: BLOOD  Result Value Ref Range Status   Specimen Description   Final    BLOOD LEFT HAND Performed at [facility], [address] [place], [place] [address]    Special Requests   Final    BOTTLES DRAWN AEROBIC AND ANAEROBIC Blood Culture adequate volume Performed at [facility], [address] [place],  [address]    Culture  Setup Time   Final    GRAM POSITIVE COCCI ANAEROBIC BOTTLE ONLY Organism ID to follow CRITICAL RESULT CALLED TO, READ BACK BY AND VERIFIED WITHLloyd Huger AT 6553 08/06/2021 DLB Performed at [facility], [place], [place] [address]    Culture STREPTOCOCCUS PNEUMONIAE (A)  Final   Report Status 08/08/2021 FINAL  Final   Organism ID, Bacteria STREPTOCOCCUS PNEUMONIAE  Final      Susceptibility   Streptococcus pneumoniae - MIC*    ERYTHROMYCIN <=0.12 SENSITIVE Sensitive     LEVOFLOXACIN 0.5 SENSITIVE Sensitive     VANCOMYCIN 0.5 SENSITIVE Sensitive     PENICILLIN (meningitis) <=0.06 SENSITIVE Sensitive     PENO - penicillin <=0.06      PENICILLIN (non-meningitis) <=0.06 SENSITIVE Sensitive     PENICILLIN (oral) <=0.06 SENSITIVE Sensitive     CEFTRIAXONE (non-meningitis) <=0.12 SENSITIVE Sensitive     CEFTRIAXONE (meningitis) <=0.12 SENSITIVE Sensitive     * STREPTOCOCCUS PNEUMONIAE  Resp Panel by RT-PCR (Flu A&B, Covid) Nasopharyngeal Swab     Status: None   Collection Time: 08/05/21  4:04 PM   Specimen: Nasopharyngeal Swab; Nasopharyngeal(NP) swabs in vial transport medium  Result Value Ref Range Status   SARS Coronavirus 2 by RT PCR NEGATIVE NEGATIVE Final    Comment: (NOTE) SARS-CoV-2 target nucleic acids are NOT DETECTED.  The SARS-CoV-2 RNA is generally detectable in upper respiratory specimens during the acute phase of infection. The lowest concentration of SARS-CoV-2 viral copies this assay can detect is 138 copies/mL. A negative result does not preclude SARS-Cov-2 infection and should not be used as the sole basis for treatment or other patient management decisions. A negative result may occur with  improper specimen  collection/handling, submission of specimen other than nasopharyngeal swab, presence of viral mutation(s) within the areas targeted by this assay, and inadequate number of viral copies(<138 copies/mL). A negative result must be combined with clinical observations, patient history, and epidemiological information. The expected result is Negative.  Fact Sheet for Patients:  EntrepreneurPulse.com.au  Fact Sheet for Healthcare Providers:  IncredibleEmployment.be  This test is no t yet approved or cleared by the [place] FDA and  has been authorized for detection and/or diagnosis of SARS-CoV-2 by FDA under an Emergency Use Authorization (EUA). This EUA will remain  in effect (meaning this test can be used) for the duration of the COVID-19 declaration under Section 564(b)(1) of the Act, 21 U.S.C.section 360bbb-3(b)(1), unless the authorization is terminated  or revoked sooner.       Influenza A by PCR NEGATIVE NEGATIVE Final   Influenza B by PCR NEGATIVE NEGATIVE Final    Comment: (NOTE) The Xpert Xpress SARS-CoV-2/FLU/RSV plus assay is intended as an aid in the diagnosis of influenza from Nasopharyngeal swab specimens and should not be used as a sole basis for treatment. Nasal washings and aspirates are unacceptable for Xpert Xpress SARS-CoV-2/FLU/RSV testing.  Fact Sheet for Patients: EntrepreneurPulse.com.au  Fact Sheet for Healthcare Providers: IncredibleEmployment.be  This test is not yet approved or cleared by the [place] FDA and has been authorized for detection and/or diagnosis of SARS-CoV-2 by FDA under an Emergency Use Authorization (EUA). This EUA will remain in effect (meaning this test can be used) for the duration of the COVID-19 declaration under Section 564(b)(1) of the Act, 21 U.S.C. section 360bbb-3(b)(1), unless the authorization is terminated or revoked.  Performed at [facility], [address] [place], [place] [address]   Blood Culture ID Panel (Reflexed)     Status: Abnormal   Collection Time: 08/05/21  4:04 PM  Result Value Ref Range Status   Enterococcus faecalis NOT DETECTED NOT DETECTED Final   Enterococcus Faecium NOT DETECTED NOT DETECTED Final  Listeria monocytogenes NOT DETECTED NOT DETECTED Final   Staphylococcus species NOT DETECTED NOT DETECTED Final   Staphylococcus aureus (BCID) NOT DETECTED NOT DETECTED Final   Staphylococcus epidermidis NOT DETECTED NOT DETECTED Final   Staphylococcus lugdunensis NOT DETECTED NOT DETECTED Final   Streptococcus species DETECTED (A) NOT DETECTED Final    Comment: CRITICAL RESULT CALLED TO, READ BACK BY AND VERIFIED WITH: NATHAN BELUE AT 0612 08/06/2021 DLB    Streptococcus agalactiae NOT DETECTED NOT DETECTED Final   Streptococcus pneumoniae DETECTED (A) NOT DETECTED Final    Comment: CRITICAL RESULT CALLED TO, READ BACK BY AND VERIFIED WITH: NATHAN BELUE AT 0612 08/06/2021 DLB    Streptococcus pyogenes NOT DETECTED NOT DETECTED Final   A.calcoaceticus-baumannii NOT DETECTED NOT DETECTED Final   Bacteroides fragilis NOT DETECTED NOT DETECTED Final   Enterobacterales NOT DETECTED NOT DETECTED Final   Enterobacter cloacae complex NOT DETECTED NOT DETECTED Final   Escherichia coli NOT DETECTED NOT DETECTED Final   Klebsiella aerogenes NOT DETECTED NOT DETECTED Final   Klebsiella oxytoca NOT DETECTED NOT DETECTED Final   Klebsiella pneumoniae NOT DETECTED NOT DETECTED Final   Proteus species NOT DETECTED NOT DETECTED Final   Salmonella species NOT DETECTED NOT DETECTED Final   Serratia marcescens NOT DETECTED NOT DETECTED Final   Haemophilus influenzae NOT DETECTED NOT DETECTED Final   Neisseria meningitidis NOT DETECTED NOT DETECTED Final   Pseudomonas aeruginosa NOT DETECTED NOT DETECTED Final   Stenotrophomonas maltophilia NOT DETECTED NOT DETECTED Final   Candida albicans NOT DETECTED NOT  DETECTED Final   Candida auris NOT DETECTED NOT DETECTED Final   Candida glabrata NOT DETECTED NOT DETECTED Final   Candida krusei NOT DETECTED NOT DETECTED Final   Candida parapsilosis NOT DETECTED NOT DETECTED Final   Candida tropicalis NOT DETECTED NOT DETECTED Final   Cryptococcus neoformans/gattii NOT DETECTED NOT DETECTED Final    Comment: Performed at [facility], [address] [place], [place] [address]  Blood Culture (routine x 2)     Status: None (Preliminary result)   Collection Time: 08/05/21  4:20 PM   Specimen: BLOOD  Result Value Ref Range Status   Specimen Description BLOOD LEFT FA  Final   Special Requests   Final    BOTTLES DRAWN AEROBIC AND ANAEROBIC Blood Culture results may not be optimal due to an inadequate volume of blood received in culture bottles   Culture   Final    NO GROWTH 4 DAYS Performed at [facility], [address] [place], [place] [address]    Report Status PENDING  Incomplete  Urine Culture     Status: None   Collection Time: 08/05/21  5:05 PM   Specimen: Urine, Random  Result Value Ref Range Status   Specimen Description   Final    URINE, RANDOM Performed at [facility], [address] [place], [place] [address]    Special Requests   Final    NONE Performed at [facility], [address], [place], [place] [address]    Culture   Final    NO GROWTH Performed at [facility], [address], [place], [place] [address]    Report Status 08/07/2021 FINAL  Final  Expectorated Sputum Assessment w Gram Stain, Rflx to Resp Cult     Status: None   Collection Time: 08/06/21  9:33 AM   Specimen: Sputum  Result Value Ref Range Status   Specimen Description SPUTUM  Final   Special Requests NONE  Final   Sputum evaluation   Final    THIS SPECIMEN IS ACCEPTABLE FOR SPUTUM CULTURE Performed at [facility] [address] [place], [place] [address]    Report Status 08/06/2021 FINAL   Final  Culture, Respiratory w Gram Stain     Status: None   Collection Time: 08/06/21  9:33 AM   Specimen: SPU  Result Value Ref Range Status   Specimen Description   Final    SPUTUM Performed at [facility], [address], [place], [place] [address]    Special Requests   Final    NONE Reflexed from [id number] Performed at [facility], [address] [place], [place] [address]    Gram Stain   Final    FEW SQUAMOUS EPITHELIAL CELLS PRESENT FEW WBC SEEN FEW GRAM POSITIVE COCCI    Culture   Final    RARE Normal respiratory flora-no Staph aureus or Pseudomonas seen Performed at [facility], [place] [address], [place], [place] [address]    Report Status 08/08/2021 FINAL  Final     Discharge Instructions:   Discharge Instructions     Diet - low sodium heart healthy   Complete by: As directed    Discharge instructions   Complete by: As directed    Please stop smoking cigarettes and avoid alcohol. Repeat chest x-ray in 3 to 4 weeks to ensure resolution of consolidation in the right middle lobe.   Increase activity slowly   Complete by: As directed       Allergies as of 08/10/2021       Reactions   Aspirin Nausea And Vomiting, Other (See Comments)   Pt states aspirin makes her cramp and have to use coated kind.   Tramadol    Zyrtec [cetirizine]         Medication List     TAKE these medications    albuterol 108 (90 Base) MCG/ACT inhaler Commonly known as: VENTOLIN HFA Inhale 2 puffs into the lungs every 6 (six) hours as needed for wheezing or shortness of breath.   amoxicillin 500 MG tablet Commonly known as: AMOXIL Take 1 tablet (500 mg total) by mouth 3 (three) times daily for 4 days.   aspirin 81 MG EC tablet Take 1 tablet (81 mg total) by mouth daily.   atorvastatin 80 MG tablet Commonly known as: LIPITOR Take 1 tablet (80 mg total) by mouth daily.   benzonatate 100 MG capsule Commonly known as: TESSALON Take 1 capsule (100  mg total) by mouth 2 (two) times daily as needed for cough.   budesonide-formoterol 80-4.5 MCG/ACT inhaler Commonly known as: SYMBICORT Inhale 2 puffs into the lungs daily.   cholecalciferol 10 MCG (400 UNIT) Tabs tablet Commonly known as: VITAMIN D3 Take 400 Units by mouth.   ezetimibe 10 MG tablet Commonly known as: ZETIA Take 1 tablet (10 mg total) by mouth daily.   ferrous sulfate 325 (65 FE) MG tablet Take 325 mg by mouth daily.   isosorbide mononitrate 60 MG 24 hr tablet Commonly known as: IMDUR Take 1 tablet (60 mg total) by mouth daily. What changed:  medication strength how much to take   losartan 50 MG tablet Commonly known as: COZAAR Take 1 tablet (50 mg total) by mouth daily.   Repatha SureClick 564 MG/ML Soaj Generic drug: Evolocumab Inject 140 mg into the skin every 14 (fourteen) days.           If you experience worsening of your  admission symptoms, develop shortness of breath, life threatening emergency, suicidal or homicidal thoughts you must seek medical attention immediately by calling 911 or calling your MD immediately  if symptoms less severe.   You must read complete instructions/literature along with all the possible adverse reactions/side effects for all the medicines you take and that have been prescribed to you. Take any new medicines after you have completely understood and accept all the possible adverse reactions/side effects.    Please note   You were cared for by a hospitalist during your hospital stay. If you have any questions about your discharge medications or the care you received while you were in the hospital after you are discharged, you can call the unit and asked to speak with the hospitalist on call if the hospitalist that took care of you is not available. Once you are discharged, your primary care physician will handle any further medical issues. Please note that NO REFILLS for any discharge medications will be authorized once  you are discharged, as it is imperative that you return to your primary care physician (or establish a relationship with a primary care physician if you do not have one) for your aftercare needs so that they can reassess your need for medications and monitor your lab values.       Time coordinating discharge: 31 minutes  Signed:  Derec Mozingo  [facility] 08/10/2021, 9:20 AM   Pager on [email or web address]. If 7PM-7AM, please contact night-coverage at [email or web address]

## 2021-08-11 ENCOUNTER — Telehealth: Payer: Medicare Other

## 2021-08-16 ENCOUNTER — Encounter: Payer: Self-pay | Admitting: Nurse Practitioner

## 2021-08-16 ENCOUNTER — Ambulatory Visit (INDEPENDENT_AMBULATORY_CARE_PROVIDER_SITE_OTHER): Payer: Medicare Other | Admitting: Nurse Practitioner

## 2021-08-16 VITALS — BP 132/75 | HR 88 | Temp 98.3°F | Resp 16 | Ht 64.0 in | Wt 110.0 lb

## 2021-08-16 DIAGNOSIS — E876 Hypokalemia: Secondary | ICD-10-CM | POA: Diagnosis not present

## 2021-08-16 DIAGNOSIS — J189 Pneumonia, unspecified organism: Secondary | ICD-10-CM

## 2021-08-16 DIAGNOSIS — J449 Chronic obstructive pulmonary disease, unspecified: Secondary | ICD-10-CM | POA: Diagnosis not present

## 2021-08-16 DIAGNOSIS — I10 Essential (primary) hypertension: Secondary | ICD-10-CM

## 2021-08-16 NOTE — Progress Notes (Signed)
[facility] [place] [id number] [facility] Discharge Acute Issues Care Follow Up                                                                        Patient Demographics  [name], is a 65 y.o. female  DOB 08/27/1956  MRN [id number].  Primary Provider Jonetta Osgood MSN, FNP-C   Reason for TCC follow Up - severe sepsis secondary to community-acquired pneumonia and bacteremia   Past Medical History:  Diagnosis Date   Allergy    Environmental   Arthritis    Asthma    Carotid artery stenosis    Carotid stenosis    COPD (chronic obstructive pulmonary disease) (HCC)    Coronary artery disease    Coronary artery disease    Hemorrhoids    Hyperlipidemia    Hypertension    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(HCC)    negative cardiac cath   Nicotine addiction    Peripheral vascular disease (HCC)    Pneumonia    Shortness of breath dyspnea    Tobacco abuse     Past Surgical History:  Procedure Laterality Date   ABDOMINAL HYSTERECTOMY     APPENDECTOMY     CARDIAC CATHETERIZATION     CARDIAC SURGERY     CHOLECYSTECTOMY     COLONOSCOPY  12/15/11   OH->bleeding internal hemorrhoids, otherwise normal   COLONOSCOPY WITH PROPOFOL N/A 04/12/2015   Procedure: COLONOSCOPY WITH PROPOFOL;  Surgeon: Lucilla Lame, MD;  Location: [facility] ENDOSCOPY;  Service: Endoscopy;  Laterality: N/A;   COLONOSCOPY WITH PROPOFOL N/A 01/05/2020   Procedure: COLONOSCOPY WITH PROPOFOL;  Surgeon: Lucilla Lame, MD;  Location: [facility] ENDOSCOPY;  Service: Endoscopy;  Laterality: N/A;   ENDARTERECTOMY FEMORAL Left 04/26/2015   Procedure: ENDARTERECTOMY FEMORAL;  Surgeon: Algernon Huxley, MD;  Location: [facility] ORS;  Service: Vascular;  Laterality: Left;   ENDOVASCULAR STENT INSERTION Bilateral    Legs   FLEXIBLE BRONCHOSCOPY N/A 02/02/2015   Procedure: FLEXIBLE BRONCHOSCOPY;  Surgeon: Allyne Gee,  MD;  Location: [facility] ORS;  Service: Pulmonary;  Laterality: N/A;   HEMORRHOID SURGERY     PERIPHERAL VASCULAR CATHETERIZATION Left 04/25/2015   Procedure: Lower Extremity Angiography;  Surgeon: Algernon Huxley, MD;  Location: [place] [facility];  Service: Cardiovascular;  Laterality: Left;   PERIPHERAL VASCULAR CATHETERIZATION Left 04/25/2015   Procedure: Lower Extremity Intervention;  Surgeon: Algernon Huxley, MD;  Location: [place] [facility];  Service: Cardiovascular;  Laterality: Left;   PERIPHERAL VASCULAR CATHETERIZATION Left 01/02/2016   Procedure: Lower Extremity Angiography;  Surgeon: Algernon Huxley, MD;  Location: [place] [facility];  Service: Cardiovascular;  Laterality: Left;   PERIPHERAL VASCULAR CATHETERIZATION  01/02/2016   Procedure: Lower Extremity Intervention;  Surgeon: Algernon Huxley, MD;  Location: [place] [facility];  Service: Cardiovascular;;   THROMBECTOMY FEMORAL ARTERY Left 04/26/2015   Procedure: THROMBECTOMY FEMORAL ARTERY;  Surgeon: Algernon Huxley, MD;  Location: [facility] ORS;  Service: Vascular;  Laterality: Left;       Recent HPI and Hospital Course  Ms. [name] is a 65 y.o. female with medical history significant for hypertension, hyperlipidemia, COPD, asthma, PVD, CAD with previous MI, carotid artery stenosis, lung abscess, GI bleed, iron deficiency anemia, tobacco use disorder, alcohol use disorder.  She presented to the hospital with lightheadedness, dizziness, cough, shortness of breath and diarrhea.   She was found to have severe sepsis secondary to community-acquired pneumonia and bacteremia.  She was treated with empiric IV antibiotics.  Blood culture revealed streptococcal pneumoniae consistent with strep pneumonia bacteremia.  She also had diarrhea and AKI improved with IV fluids.  She had hypokalemia that was repleted.  Troponins were mildly elevated but these were attributed to demand ischemia.  Her condition has improved and she is deemed stable for discharge home  today.  She has been advised to avoid alcohol and cigarette smoking.  [facility] Acute Care Issue to be followed in the Clinic   Principal Problem:   Severe sepsis ([place]) Active Problems:   H/O hypercholesterolemia   Essential hypertension   Tobacco use disorder   Alcohol abuse   Coronary artery disease due to lipid rich plaque   AKI (acute kidney injury) ([place])   COPD exacerbation (HCC)   Thrombocytopenia (HCC)   Hypokalemia   Elevated troponin   Iron deficiency anemia   Diarrhea   Community acquired pneumonia   Subjective:   [name] today has, No headache, No chest pain, No abdominal pain - No Nausea, No new weakness tingling or numbness, No Cough, no SOB.   Assessment & Plan    1. Community acquired pneumonia of right middle lobe of lung Treated in the hospital for this problem which progressed into sepsis due to co-morbid conditions and severity of infection. Will have repeat chest xray done to assess for resolution of pneumonia as recommended.   2. Chronic obstructive pulmonary disease, unspecified COPD type ([place]) Copd exacerbation treated in hospital due to pneumonia and sepsis.   3. Essential hypertension Stable with current medications, continue as prescribed.   4. Hypokalemia Low potassium level while hospitalized, this was treated and resolved prior to discharge.    Reason for frequent admissions/ER visits   COPD  Continued tobacco use Pneumonia     Objective:   Vitals:   08/16/21 1538  BP: 132/75  Pulse: 88  Resp: 16  Temp: 98.3 F (36.8 C)  SpO2: 97%  Weight: 110 lb (49.9 kg)  Height: 5\' 4"  (1.626 m)    Wt Readings from Last 3 Encounters:  08/16/21 110 lb (49.9 kg)  08/05/21 110 lb (49.9 kg)  07/13/21 112 lb 9.6 oz (51.1 kg)    Allergies as of 08/16/2021       Reactions   Aspirin Nausea And Vomiting, Other (See Comments)   Pt states aspirin makes her cramp and have to use coated kind.   Tramadol    Zyrtec [cetirizine]          Medication List        Accurate as of August 16, 2021 11:59 PM. If you have any questions, ask your nurse or doctor.          albuterol 108 (90 Base) MCG/ACT inhaler Commonly known as: VENTOLIN HFA Inhale 2 puffs into the lungs every 6 (six) hours as needed for wheezing or shortness of breath.   aspirin 81 MG EC tablet Take 1 tablet (81 mg total) by mouth daily.   atorvastatin 80  MG tablet Commonly known as: LIPITOR Take 1 tablet (80 mg total) by mouth daily.   benzonatate 100 MG capsule Commonly known as: TESSALON Take 1 capsule (100 mg total) by mouth 2 (two) times daily as needed for cough.   budesonide-formoterol 80-4.5 MCG/ACT inhaler Commonly known as: SYMBICORT Inhale 2 puffs into the lungs daily.   cholecalciferol 10 MCG (400 UNIT) Tabs tablet Commonly known as: VITAMIN D3 Take 400 Units by mouth.   ezetimibe 10 MG tablet Commonly known as: ZETIA Take 1 tablet (10 mg total) by mouth daily.   ferrous sulfate 325 (65 FE) MG tablet Take 325 mg by mouth daily.   isosorbide mononitrate 60 MG 24 hr tablet Commonly known as: IMDUR Take 1 tablet (60 mg total) by mouth daily.   losartan 50 MG tablet Commonly known as: COZAAR Take 1 tablet (50 mg total) by mouth daily.   Repatha SureClick 782 MG/ML [place] Generic drug: Evolocumab Inject 140 mg into the skin every 14 (fourteen) days.         Physical Exam: Constitutional: Patient appears well-developed and well-nourished. Not in obvious distress. HENT: Normocephalic, atraumatic, External right and left ear normal. Oropharynx is clear and moist.  Eyes: Conjunctivae and EOM are normal. PERRLA, no scleral icterus. Neck: Normal ROM. Neck supple. No JVD. No tracheal deviation. No thyromegaly. CVS: RRR, S1/S2 +, no murmurs, no gallops, no carotid bruit.  Pulmonary: Effort and breath sounds normal, no stridor, rhonchi, wheezes, rales.  Abdominal: Soft. BS +, no distension, tenderness, rebound or  guarding.  Musculoskeletal: Normal range of motion. No edema and no tenderness.  Lymphadenopathy: No lymphadenopathy noted, cervical, inguinal or axillary Neuro: Alert. Normal reflexes, muscle tone coordination. No cranial nerve deficit. Skin: Skin is warm and dry. No rash noted. Not diaphoretic. No erythema. No pallor. Psychiatric: Normal mood and affect. Behavior, judgment, thought content normal.   Data Review   Micro Results No results found for this or any previous visit (from the past 240 hour(s)).   CBC No results for input(s): WBC, HGB, HCT, PLT, MCV, MCH, MCHC, RDW, LYMPHSABS, MONOABS, EOSABS, BASOSABS, BANDABS in the last 168 hours.  Invalid input(s): NEUTRABS, BANDSABD  Chemistries  Recent Labs  Lab 08/10/21 0439  NA 136  K 4.2  CL 104  CO2 23  GLUCOSE 91  BUN 17  CREATININE 0.74  CALCIUM 9.4  MG 1.8   ------------------------------------------------------------------------------------------------------------------ estimated creatinine clearance is 55.2 mL/min (by C-G formula based on SCr of 0.74 mg/dL). ------------------------------------------------------------------------------------------------------------------ No results for input(s): HGBA1C in the last 72 hours. ------------------------------------------------------------------------------------------------------------------ No results for input(s): CHOL, HDL, LDLCALC, TRIG, CHOLHDL, LDLDIRECT in the last 72 hours. ------------------------------------------------------------------------------------------------------------------ No results for input(s): TSH, T4TOTAL, T3FREE, THYROIDAB in the last 72 hours.  Invalid input(s): FREET3 ------------------------------------------------------------------------------------------------------------------ No results for input(s): VITAMINB12, FOLATE, FERRITIN, TIBC, IRON, RETICCTPCT in the last 72 hours.  Coagulation profile No results for input(s): INR, PROTIME  in the last 168 hours.  No results for input(s): DDIMER in the last 72 hours.  Cardiac Enzymes No results for input(s): CKMB, TROPONINI, MYOGLOBIN in the last 168 hours.  Invalid input(s): CK ------------------------------------------------------------------------------------------------------------------ Invalid input(s): [place]  Return for previously scheduled, F/U, Meighan Treto PCP in january.  Time Spent in minutes  45 Time spent with patient included reviewing progress notes, labs, imaging studies, and discussing plan for follow up.   [place] Controlled Substance Database was reviewed by me for overdose risk score (ORS)  This patient was seen by Jonetta Osgood, FNP-C in collaboration with Dr. Clayborn Bigness  as a part of collaborative care agreement.   Jonetta Osgood MSN, FNP-C on 08/17/2021 at 12:58 AM   **Disclaimer: This note may have been dictated with voice recognition software. Similar sounding words can inadvertently be transcribed and this note may contain transcription errors which may not have been corrected upon publication of note.**

## 2021-08-17 ENCOUNTER — Encounter: Payer: Self-pay | Admitting: Nurse Practitioner

## 2021-08-18 ENCOUNTER — Telehealth: Payer: Self-pay

## 2021-08-18 NOTE — Telephone Encounter (Signed)
-----   Message from Jonetta Osgood, NP sent at 08/17/2021 12:55 AM EST ----- Regarding: chest xray Please call patient and let her know I ordered a chest xray for her to have done next week or the week after to assess for continued resolution of her pneumonia. A follow up chest xray was recommended based on the chest xray that was done in the hospital and to ensure resolution of pneumonia is seen on imaging.

## 2021-08-29 ENCOUNTER — Ambulatory Visit
Admission: RE | Admit: 2021-08-29 | Discharge: 2021-08-29 | Disposition: A | Discharge status: Home / Self Care | Payer: Medicare Other | Attending: Nurse Practitioner | Admitting: Nurse Practitioner

## 2021-08-29 ENCOUNTER — Telehealth: Payer: Self-pay

## 2021-08-29 ENCOUNTER — Ambulatory Visit
Admission: RE | Admit: 2021-08-29 | Discharge: 2021-08-29 | Disposition: A | Discharge status: Home / Self Care | Payer: Medicare Other | Source: Ambulatory Visit | Attending: Nurse Practitioner | Admitting: Nurse Practitioner

## 2021-08-29 DIAGNOSIS — J189 Pneumonia, unspecified organism: Secondary | ICD-10-CM

## 2021-08-29 DIAGNOSIS — R918 Other nonspecific abnormal finding of lung field: Secondary | ICD-10-CM | POA: Diagnosis not present

## 2021-08-29 DIAGNOSIS — J439 Emphysema, unspecified: Secondary | ICD-10-CM | POA: Diagnosis not present

## 2021-08-29 DIAGNOSIS — J449 Chronic obstructive pulmonary disease, unspecified: Secondary | ICD-10-CM

## 2021-08-29 NOTE — Telephone Encounter (Signed)
-----   Message from Jonetta Osgood, NP sent at 08/29/2021  8:08 AM EST ----- Chest xray shows that her pneumonia is continuing to resolve. No further interventions necessary please call and let patient know.

## 2021-08-29 NOTE — Telephone Encounter (Signed)
Pt notified for chest xray result

## 2021-08-29 NOTE — Progress Notes (Signed)
Chest xray shows that her pneumonia is continuing to resolve. No further interventions necessary please call and let patient know.

## 2021-09-18 ENCOUNTER — Other Ambulatory Visit: Payer: Self-pay

## 2021-09-18 ENCOUNTER — Encounter: Payer: Self-pay | Admitting: Nurse Practitioner

## 2021-09-18 ENCOUNTER — Ambulatory Visit (INDEPENDENT_AMBULATORY_CARE_PROVIDER_SITE_OTHER): Payer: Medicare Other | Admitting: Nurse Practitioner

## 2021-09-18 VITALS — BP 160/80 | HR 88 | Temp 98.0°F | Resp 16 | Ht 64.0 in | Wt 115.0 lb

## 2021-09-18 DIAGNOSIS — E782 Mixed hyperlipidemia: Secondary | ICD-10-CM | POA: Diagnosis not present

## 2021-09-18 DIAGNOSIS — J189 Pneumonia, unspecified organism: Secondary | ICD-10-CM

## 2021-09-18 DIAGNOSIS — I10 Essential (primary) hypertension: Secondary | ICD-10-CM | POA: Diagnosis not present

## 2021-09-18 DIAGNOSIS — J449 Chronic obstructive pulmonary disease, unspecified: Secondary | ICD-10-CM

## 2021-09-18 MED ORDER — LOSARTAN POTASSIUM 50 MG PO TABS: 100.0000 mg | ORAL_TABLET | Freq: Every day | ORAL | 1 refills | Status: DC

## 2021-09-18 NOTE — Progress Notes (Signed)
[facility] [place],  [id number]  Internal MEDICINE  Office Visit Note  Patient Name: [name]  [id number]  [id number]  Date of Service: 09/18/2021  Chief Complaint  Patient presents with   Follow-up   Hyperlipidemia   Hypertension   COPD    HPI Rashmi presents for a follow up visit for COPD, hypertension and hyperlipidemia. She was treated for pneumonia in December 2022. She had a chest xray on 08/29/22 that showed that the pneumonia was resolving. She reports that she is breathing better and feeling better. Her blood pressure continues to be elevated and poorly controlled.     Current Medication: Outpatient Encounter Medications as of 09/18/2021  Medication Sig   albuterol (VENTOLIN HFA) 108 (90 Base) MCG/ACT inhaler Inhale 2 puffs into the lungs every 6 (six) hours as needed for wheezing or shortness of breath.   aspirin EC 81 MG EC tablet Take 1 tablet (81 mg total) by mouth daily.   budesonide-formoterol (SYMBICORT) 80-4.5 MCG/ACT inhaler Inhale 2 puffs into the lungs daily.   cholecalciferol (VITAMIN D) 400 units TABS tablet Take 400 Units by mouth.   ezetimibe (ZETIA) 10 MG tablet Take 1 tablet (10 mg total) by mouth daily.   ferrous sulfate 325 (65 FE) MG tablet Take 325 mg by mouth daily.   isosorbide mononitrate (IMDUR) 60 MG 24 hr tablet Take 1 tablet (60 mg total) by mouth daily.   REPATHA SURECLICK 379 MG/ML SOAJ Inject 140 mg into the skin every 14 (fourteen) days.   [DISCONTINUED] amoxicillin (AMOXIL) 500 MG capsule Take 500 mg by mouth 3 (three) times daily. (Patient not taking: Reported on 09/24/2021)   [DISCONTINUED] atorvastatin (LIPITOR) 80 MG tablet Take 1 tablet (80 mg total) by mouth daily.   [DISCONTINUED] benzonatate (TESSALON) 100 MG capsule Take 1 capsule (100 mg total) by mouth 2 (two) times daily as needed for cough. (Patient not taking: Reported on 09/24/2021)   [DISCONTINUED] losartan (COZAAR) 50 MG tablet Take 1  tablet (50 mg total) by mouth daily.   losartan (COZAAR) 50 MG tablet Take 2 tablets (100 mg total) by mouth daily.   No facility-administered encounter medications on file as of 09/18/2021.    Surgical History: Past Surgical History:  Procedure Laterality Date   ABDOMINAL HYSTERECTOMY     APPENDECTOMY     CARDIAC CATHETERIZATION     CARDIAC SURGERY     CHOLECYSTECTOMY     COLONOSCOPY  12/15/11   OH->bleeding internal hemorrhoids, otherwise normal   COLONOSCOPY WITH PROPOFOL N/A 04/12/2015   Procedure: COLONOSCOPY WITH PROPOFOL;  Surgeon: Lucilla Lame, MD;  Location: [facility] ENDOSCOPY;  Service: Endoscopy;  Laterality: N/A;   COLONOSCOPY WITH PROPOFOL N/A 01/05/2020   Procedure: COLONOSCOPY WITH PROPOFOL;  Surgeon: Lucilla Lame, MD;  [facility] ENDOSCOPY;  Service: Endoscopy;  Laterality: N/A;   ENDARTERECTOMY FEMORAL Left 04/26/2015   Procedure: ENDARTERECTOMY FEMORAL;  Surgeon: Algernon Huxley, MD;  Location: [facility] ORS;  Service: Vascular;  Laterality: Left;   ENDOVASCULAR STENT INSERTION Bilateral    Legs   FLEXIBLE BRONCHOSCOPY N/A 02/02/2015   Procedure: FLEXIBLE BRONCHOSCOPY;  Surgeon: Allyne Gee, MD;  Location: [facility] ORS;  Service: Pulmonary;  Laterality: N/A;   HEMORRHOID SURGERY     PERIPHERAL VASCULAR CATHETERIZATION Left 04/25/2015   Procedure: Lower Extremity Angiography;  Surgeon: Algernon Huxley, MD;  Location: [place] [facility];  Service: Cardiovascular;  Laterality: Left;   PERIPHERAL VASCULAR CATHETERIZATION Left 04/25/2015   Procedure: Lower Extremity Intervention;  Surgeon: Algernon Huxley, MD;  Location: [place] [facility];  Service: Cardiovascular;  Laterality: Left;   PERIPHERAL VASCULAR CATHETERIZATION Left 01/02/2016   Procedure: Lower Extremity Angiography;  Surgeon: Algernon Huxley, MD;  Location: [place] [facility];  Service: Cardiovascular;  Laterality: Left;   PERIPHERAL VASCULAR CATHETERIZATION  01/02/2016   Procedure: Lower Extremity Intervention;  Surgeon: Algernon Huxley, MD;   Location: [place] [facility];  Service: Cardiovascular;;   THROMBECTOMY FEMORAL ARTERY Left 04/26/2015   Procedure: THROMBECTOMY FEMORAL ARTERY;  Surgeon: Algernon Huxley, MD;  Location: [facility] ORS;  Service: Vascular;  Laterality: Left;    Medical History: Past Medical History:  Diagnosis Date   Allergy    Environmental   Arthritis    Asthma    Carotid artery stenosis    Carotid stenosis    COPD (chronic obstructive pulmonary disease) (HCC)    Coronary artery disease    Coronary artery disease    Hemorrhoids    Hyperlipidemia    Hypertension    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(HCC)    negative cardiac cath   Nicotine addiction    Peripheral vascular disease (HCC)    Pneumonia    Shortness of breath dyspnea    Tobacco abuse     Family History: Family History  Problem Relation Age of Onset   Hypertension Mother    Hypertension Father     Social History   Socioeconomic History   Marital status: Single    Spouse name: Not on file   Number of children: Not on file   Years of education: Not on file   Highest education level: Not on file  Occupational History   Not on file  Tobacco Use   Smoking status: Every Day    Packs/day: 1.00    Years: 41.00    Pack years: 41.00    Types: Cigarettes   Smokeless tobacco: Never  Vaping Use   Vaping Use: Never used  Substance and Sexual Activity   Alcohol use: Yes    Alcohol/week: 0.0 standard drinks    Comment: weekends, couple beers, pint of liquer only on weekends.  Last drank approx 1 month ago.   Drug use: No   Sexual activity: Not Currently  Other Topics Concern   Not on file  Social History Narrative   Lives with family    Social Determinants of Health   Financial Resource Strain: Low Risk    Difficulty of Paying Living Expenses: Not very hard  Food Insecurity: Not on file  Transportation Needs: Not on file  Physical Activity: Not on file  Stress: Not on file  Social Connections: Not on file  Intimate Partner Violence: Not  on file      Review of Systems  Constitutional:  Negative for chills, fatigue and unexpected weight change.  HENT: Negative.  Negative for congestion, rhinorrhea, sneezing and sore throat.   Eyes:  Negative for redness.  Respiratory:  Positive for cough. Negative for chest tightness, shortness of breath and wheezing.   Cardiovascular: Negative.  Negative for chest pain and palpitations.  Gastrointestinal: Negative.  Negative for abdominal pain, constipation, diarrhea, nausea and vomiting.  Genitourinary:  Negative for dysuria and frequency.  Musculoskeletal: Negative.  Negative for arthralgias, back pain, joint swelling and neck pain.  Skin:  Negative for rash.  Neurological: Negative.  Negative for tremors and numbness.  Hematological:  Negative for adenopathy. Does not bruise/bleed easily.  Psychiatric/Behavioral:  Negative for behavioral problems (Depression), sleep disturbance and suicidal  ideas. The patient is not nervous/anxious.    Vital Signs: BP (!) 160/80 Comment: 174/98   Pulse 88    Temp 98 F (36.7 C)    Resp 16    Ht 5\' 4"  (1.626 m)    Wt 115 lb (52.2 kg)    LMP  (LMP Unknown)    SpO2 95%    BMI 19.74 kg/m    Physical Exam Vitals reviewed.  Constitutional:      General: She is not in acute distress.    Appearance: Normal appearance. She is normal weight. She is not ill-appearing.  HENT:     Head: Normocephalic and atraumatic.  Eyes:     Pupils: Pupils are equal, round, and reactive to light.  Cardiovascular:     Rate and Rhythm: Normal rate and regular rhythm.  Pulmonary:     Effort: Pulmonary effort is normal. No respiratory distress.  Neurological:     Mental Status: She is alert and oriented to person, place, and time.     Cranial Nerves: No cranial nerve deficit.     Coordination: Coordination normal.     Gait: Gait normal.  Psychiatric:        Mood and Affect: Mood normal.        Behavior: Behavior normal.       Assessment/Plan: 1. Essential  hypertension Losartan dose increased to 100 mg daily. Follow up in 1 month - losartan (COZAAR) 50 MG tablet; Take 2 tablets (100 mg total) by mouth daily.  Dispense: 90 tablet; Refill: 1  2. Community acquired pneumonia of right middle lobe of lung Resolved.   3. Chronic obstructive pulmonary disease, unspecified COPD type ([place]) Stable, pneumonia has resolved.   4. Mixed hyperlipidemia Takes repatha and atorvastatin   General Counseling: Desirai verbalizes understanding of the findings of todays visit and agrees with plan of treatment. I have discussed any further diagnostic evaluation that may be needed or ordered today. We also reviewed her medications today. she has been encouraged to call the office with any questions or concerns that should arise related to todays visit.    No orders of the defined types were placed in this encounter.   Meds ordered this encounter  Medications   losartan (COZAAR) 50 MG tablet    Sig: Take 2 tablets (100 mg total) by mouth daily.    Dispense:  90 tablet    Refill:  1    Note increased dose, patient may not need refill yet if she just picked one up    Return in about 1 month (around 10/19/2021) for F/U, BP check, Ikia Cincotta PCP.   Total time spent:30 Minutes Time spent includes review of chart, medications, test results, and follow up plan with the patient.   [place] Controlled Substance Database was reviewed by me.  This patient was seen by Jonetta Osgood, FNP-C in collaboration with Dr. Clayborn Bigness as a part of collaborative care agreement.   Dimples Probus R. Valetta Fuller, MSN, FNP-C Internal medicine

## 2021-09-22 DIAGNOSIS — E782 Mixed hyperlipidemia: Secondary | ICD-10-CM | POA: Diagnosis not present

## 2021-09-22 DIAGNOSIS — F17200 Nicotine dependence, unspecified, uncomplicated: Secondary | ICD-10-CM | POA: Diagnosis not present

## 2021-09-22 DIAGNOSIS — I351 Nonrheumatic aortic (valve) insufficiency: Secondary | ICD-10-CM | POA: Diagnosis not present

## 2021-09-22 DIAGNOSIS — I340 Nonrheumatic mitral (valve) insufficiency: Secondary | ICD-10-CM | POA: Diagnosis not present

## 2021-09-22 DIAGNOSIS — I739 Peripheral vascular disease, unspecified: Secondary | ICD-10-CM | POA: Diagnosis not present

## 2021-09-22 DIAGNOSIS — I2510 Atherosclerotic heart disease of native coronary artery without angina pectoris: Secondary | ICD-10-CM | POA: Diagnosis not present

## 2021-09-22 DIAGNOSIS — I10 Essential (primary) hypertension: Secondary | ICD-10-CM | POA: Diagnosis not present

## 2021-09-23 ENCOUNTER — Emergency Department: Payer: Medicare Other

## 2021-09-23 ENCOUNTER — Other Ambulatory Visit: Payer: Self-pay

## 2021-09-23 ENCOUNTER — Observation Stay
Admission: EM | Admit: 2021-09-23 | Discharge: 2021-09-24 | Disposition: A | Discharge status: Home / Self Care | Payer: Medicare Other | Attending: Student | Admitting: Student

## 2021-09-23 DIAGNOSIS — Z743 Need for continuous supervision: Secondary | ICD-10-CM | POA: Diagnosis not present

## 2021-09-23 DIAGNOSIS — R55 Syncope and collapse: Principal | ICD-10-CM | POA: Diagnosis present

## 2021-09-23 DIAGNOSIS — D649 Anemia, unspecified: Secondary | ICD-10-CM | POA: Diagnosis present

## 2021-09-23 DIAGNOSIS — F17200 Nicotine dependence, unspecified, uncomplicated: Secondary | ICD-10-CM | POA: Diagnosis present

## 2021-09-23 DIAGNOSIS — F17210 Nicotine dependence, cigarettes, uncomplicated: Secondary | ICD-10-CM | POA: Insufficient documentation

## 2021-09-23 DIAGNOSIS — I2510 Atherosclerotic heart disease of native coronary artery without angina pectoris: Secondary | ICD-10-CM | POA: Insufficient documentation

## 2021-09-23 DIAGNOSIS — J441 Chronic obstructive pulmonary disease with (acute) exacerbation: Secondary | ICD-10-CM | POA: Diagnosis present

## 2021-09-23 DIAGNOSIS — F1010 Alcohol abuse, uncomplicated: Secondary | ICD-10-CM | POA: Diagnosis present

## 2021-09-23 DIAGNOSIS — Z20822 Contact with and (suspected) exposure to covid-19: Secondary | ICD-10-CM | POA: Diagnosis not present

## 2021-09-23 DIAGNOSIS — N179 Acute kidney failure, unspecified: Secondary | ICD-10-CM | POA: Diagnosis present

## 2021-09-23 DIAGNOSIS — F10221 Alcohol dependence with intoxication delirium: Secondary | ICD-10-CM | POA: Diagnosis present

## 2021-09-23 DIAGNOSIS — D539 Nutritional anemia, unspecified: Secondary | ICD-10-CM | POA: Insufficient documentation

## 2021-09-23 DIAGNOSIS — J439 Emphysema, unspecified: Secondary | ICD-10-CM | POA: Diagnosis not present

## 2021-09-23 DIAGNOSIS — Z7982 Long term (current) use of aspirin: Secondary | ICD-10-CM | POA: Diagnosis not present

## 2021-09-23 DIAGNOSIS — Z79899 Other long term (current) drug therapy: Secondary | ICD-10-CM | POA: Insufficient documentation

## 2021-09-23 DIAGNOSIS — R2681 Unsteadiness on feet: Secondary | ICD-10-CM | POA: Diagnosis not present

## 2021-09-23 DIAGNOSIS — E872 Acidosis, unspecified: Secondary | ICD-10-CM | POA: Diagnosis present

## 2021-09-23 DIAGNOSIS — R6889 Other general symptoms and signs: Secondary | ICD-10-CM | POA: Diagnosis not present

## 2021-09-23 DIAGNOSIS — I10 Essential (primary) hypertension: Secondary | ICD-10-CM | POA: Diagnosis present

## 2021-09-23 DIAGNOSIS — R404 Transient alteration of awareness: Secondary | ICD-10-CM | POA: Diagnosis not present

## 2021-09-23 DIAGNOSIS — J449 Chronic obstructive pulmonary disease, unspecified: Secondary | ICD-10-CM | POA: Diagnosis present

## 2021-09-23 DIAGNOSIS — F10929 Alcohol use, unspecified with intoxication, unspecified: Secondary | ICD-10-CM | POA: Diagnosis present

## 2021-09-23 LAB — COMPREHENSIVE METABOLIC PANEL
ALT: 16 U/L (ref 0–44)
AST: 21 U/L (ref 15–41)
Albumin: 3.6 g/dL (ref 3.5–5.0)
Alkaline Phosphatase: 54 U/L (ref 38–126)
Anion gap: 14 (ref 5–15)
BUN: 16 mg/dL (ref 8–23)
CO2: 19 mmol/L — ABNORMAL LOW (ref 22–32)
Calcium: 8.6 mg/dL — ABNORMAL LOW (ref 8.9–10.3)
Chloride: 105 mmol/L (ref 98–111)
Creatinine, Ser: 1.42 mg/dL — ABNORMAL HIGH (ref 0.44–1.00)
GFR, Estimated: 41 mL/min — ABNORMAL LOW (ref >60)
Glucose, Bld: 78 mg/dL (ref 70–99)
Potassium: 3.2 mmol/L — ABNORMAL LOW (ref 3.5–5.1)
Sodium: 138 mmol/L (ref 135–145)
Total Bilirubin: 0.2 mg/dL — ABNORMAL LOW (ref 0.3–1.2)
Total Protein: 6.7 g/dL (ref 6.5–8.1)

## 2021-09-23 LAB — CBC WITH DIFFERENTIAL/PLATELET
Abs Immature Granulocytes: 0.04 10*3/uL (ref 0.00–0.07)
Basophils Absolute: 0 10*3/uL (ref 0.0–0.1)
Basophils Relative: 0 %
Eosinophils Absolute: 0 10*3/uL (ref 0.0–0.5)
Eosinophils Relative: 0 %
HCT: 37.1 % (ref 36.0–46.0)
Hemoglobin: 11.9 g/dL — ABNORMAL LOW (ref 12.0–15.0)
Immature Granulocytes: 1 %
Lymphocytes Relative: 24 %
Lymphs Abs: 1.3 10*3/uL (ref 0.7–4.0)
MCH: 33.2 pg (ref 26.0–34.0)
MCHC: 32.1 g/dL (ref 30.0–36.0)
MCV: 103.6 fL — ABNORMAL HIGH (ref 80.0–100.0)
Monocytes Absolute: 0.4 10*3/uL (ref 0.1–1.0)
Monocytes Relative: 8 %
Neutro Abs: 3.6 10*3/uL (ref 1.7–7.7)
Neutrophils Relative %: 67 %
Platelets: 142 10*3/uL — ABNORMAL LOW (ref 150–400)
RBC: 3.58 MIL/uL — ABNORMAL LOW (ref 3.87–5.11)
RDW: 15.6 % — ABNORMAL HIGH (ref 11.5–15.5)
WBC: 5.4 10*3/uL (ref 4.0–10.5)
nRBC: 0 % (ref 0.0–0.2)

## 2021-09-23 LAB — URINALYSIS, ROUTINE W REFLEX MICROSCOPIC
Bilirubin Urine: NEGATIVE
Glucose, UA: NEGATIVE mg/dL
Hgb urine dipstick: NEGATIVE
Ketones, ur: NEGATIVE mg/dL
Nitrite: NEGATIVE
Protein, ur: NEGATIVE mg/dL
Specific Gravity, Urine: 1.009 (ref 1.005–1.030)
pH: 5 (ref 5.0–8.0)

## 2021-09-23 LAB — LACTIC ACID, PLASMA
Lactic Acid, Venous: 3.7 mmol/L (ref 0.5–1.9)
Lactic Acid, Venous: 2.4 mmol/L (ref 0.5–1.9)

## 2021-09-23 LAB — RESP PANEL BY RT-PCR (FLU A&B, COVID) ARPGX2
Influenza A by PCR: NEGATIVE
Influenza B by PCR: NEGATIVE
SARS Coronavirus 2 by RT PCR: NEGATIVE

## 2021-09-23 LAB — TROPONIN I (HIGH SENSITIVITY)
Troponin I (High Sensitivity): 7 ng/L (ref <18)
Troponin I (High Sensitivity): 8 ng/L (ref <18)

## 2021-09-23 MED ORDER — ATORVASTATIN CALCIUM 20 MG PO TABS
80.0000 mg | ORAL_TABLET | Freq: Every day | ORAL | Status: DC
  Administered 2021-09-24: 80 mg via ORAL
  Filled 2021-09-23 (×2): qty 4

## 2021-09-23 MED ORDER — BENZONATATE 100 MG PO CAPS: 100.0000 mg | ORAL_CAPSULE | Freq: Two times a day (BID) | ORAL | Status: DC | PRN

## 2021-09-23 MED ORDER — SODIUM CHLORIDE 0.9 % IV SOLN: Freq: Once | INTRAVENOUS | Status: AC

## 2021-09-23 MED ORDER — LOSARTAN POTASSIUM 50 MG PO TABS: 100.0000 mg | ORAL_TABLET | Freq: Every day | ORAL | Status: DC

## 2021-09-23 MED ORDER — ACETAMINOPHEN 325 MG RE SUPP
650.0000 mg | Freq: Four times a day (QID) | RECTAL | Status: DC | PRN
  Filled 2021-09-23: qty 2

## 2021-09-23 MED ORDER — EZETIMIBE 10 MG PO TABS
10.0000 mg | ORAL_TABLET | Freq: Every day | ORAL | Status: DC
  Administered 2021-09-24: 10 mg via ORAL
  Filled 2021-09-23: qty 1

## 2021-09-23 MED ORDER — HEPARIN SODIUM (PORCINE) 5000 UNIT/ML IJ SOLN
5000.0000 [IU] | Freq: Three times a day (TID) | INTRAMUSCULAR | Status: DC
  Administered 2021-09-24 (×2): 5000 [IU] via SUBCUTANEOUS
  Filled 2021-09-23 (×2): qty 1

## 2021-09-23 MED ORDER — ISOSORBIDE MONONITRATE ER 60 MG PO TB24
60.0000 mg | ORAL_TABLET | Freq: Every day | ORAL | Status: DC
  Administered 2021-09-24: 60 mg via ORAL
  Filled 2021-09-23: qty 1

## 2021-09-23 MED ORDER — SODIUM CHLORIDE 0.9 % IV BOLUS
1000.0000 mL | Freq: Once | INTRAVENOUS | Status: AC
  Administered 2021-09-23: 1000 mL via INTRAVENOUS

## 2021-09-23 MED ORDER — ACETAMINOPHEN 325 MG PO TABS: 650.0000 mg | ORAL_TABLET | Freq: Four times a day (QID) | ORAL | Status: DC | PRN

## 2021-09-23 MED ORDER — LACTATED RINGERS IV SOLN: INTRAVENOUS | Status: DC

## 2021-09-23 MED ORDER — ASPIRIN EC 81 MG PO TBEC
81.0000 mg | DELAYED_RELEASE_TABLET | Freq: Every day | ORAL | Status: DC
  Administered 2021-09-24: 81 mg via ORAL
  Filled 2021-09-23: qty 1

## 2021-09-23 MED ORDER — FLUTICASONE FUROATE-VILANTEROL 100-25 MCG/ACT IN AEPB
1.0000 | INHALATION_SPRAY | Freq: Every day | RESPIRATORY_TRACT | Status: DC
  Filled 2021-09-23 (×2): qty 28

## 2021-09-23 MED ORDER — ALBUTEROL SULFATE (2.5 MG/3ML) 0.083% IN NEBU: 2.5000 mg | INHALATION_SOLUTION | Freq: Four times a day (QID) | RESPIRATORY_TRACT | Status: DC | PRN

## 2021-09-23 MED ORDER — SODIUM CHLORIDE 0.9% FLUSH
3.0000 mL | Freq: Two times a day (BID) | INTRAVENOUS | Status: DC
  Administered 2021-09-24 (×2): 3 mL via INTRAVENOUS

## 2021-09-23 NOTE — ED Triage Notes (Signed)
Pt arrived by EMS after syncopal event at home. Pt was able to get herself up but was very weak and fell again. Pt also complaing in of left sided chest pain, that gets worse with pressure.   Pt is alert and oriented on arrival  EMS reports low Blood pressure that improved during transport

## 2021-09-23 NOTE — H&P (Signed)
History and Physical    [name] KWI:[id number] DOB: 04-18-1956 DOA: 09/23/2021  PCP: Jonetta Osgood, NP    Patient coming from:  Home    Chief Complaint:  Syncope and collapse.    HPI:  [name] is a 66 y.o. female seen in ed with complaints passing out and waking up on her kitchen floor. Pt not aware of what happened.  Patient was able to get herself up from the floor.  Per report this patient was trying to get up she almost fell again but she was helped and lowered to the floor.  Initial assessment was done by fire brigade and patient's blood pressure was found to be in the 70s and repeat blood pressure showed systolic of 24Q patient reports chest wall tenderness but no other symptoms-emergency room upon my interview patient denies any chest pain shortness of breath palpitations or any other complaints she states that she does not know when she is going to fall and does not recall any symptoms prior.  Patient does drink heavily and does smoke last alcoholic drink was yesterday.   Pt has past medical history of alcohol abuse, tobacco abuse, allergies to aspirin, tramadol, Zyrtec, COPD, pneumonia history, hypertension. ED Course:   Vitals:   09/23/21 2056 09/23/21 2130 09/23/21 2200 09/23/21 2230  BP: (!) 92/56 121/69 110/70 126/73  Pulse: 88 90 88 86  Resp: 18 16 18 19   Temp:      SpO2: 91% 91% 93% 95%  Weight:      Height:      Initial history of blood pressure to be on the low side with systolics, O2 sats of 68% on room air. In the emergency room patient is somnolent arousable answers questions but is a poor historian.  Initial EKG shows sinus rhythm 77 with right axis and no ST-T wave changes.  Lactic acid of 3.7 and repeat of 2.4, Shows CMP with potassium of 3.2 creatinine 1.42 CBC shows normal white count of 5.4 hemoglobin of 11.9 and platelet counts of 142,000.  Review of Systems:  Review of Systems  Unable to perform ROS: Other (Patient very somnolent and  tired.)     Past Medical History:  Diagnosis Date   Allergy    Environmental   Arthritis    Asthma    Carotid artery stenosis    Carotid stenosis    COPD (chronic obstructive pulmonary disease) (HCC)    Coronary artery disease    Coronary artery disease    Hemorrhoids    Hyperlipidemia    Hypertension    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(HCC)    negative cardiac cath   Nicotine addiction    Peripheral vascular disease (HCC)    Pneumonia    Shortness of breath dyspnea    Tobacco abuse     Past Surgical History:  Procedure Laterality Date   ABDOMINAL HYSTERECTOMY     APPENDECTOMY     CARDIAC CATHETERIZATION     CARDIAC SURGERY     CHOLECYSTECTOMY     COLONOSCOPY  12/15/11   OH->bleeding internal hemorrhoids, otherwise normal   COLONOSCOPY WITH PROPOFOL N/A 04/12/2015   Procedure: COLONOSCOPY WITH PROPOFOL;  Surgeon: Lucilla Lame, MD;  Location: [facility] ENDOSCOPY;  Service: Endoscopy;  Laterality: N/A;   COLONOSCOPY WITH PROPOFOL N/A 01/05/2020   Procedure: COLONOSCOPY WITH PROPOFOL;  Surgeon: Lucilla Lame, MD;  Location: [facility] ENDOSCOPY;  Service: Endoscopy;  Laterality: N/A;   ENDARTERECTOMY FEMORAL Left 04/26/2015   Procedure: ENDARTERECTOMY FEMORAL;  Surgeon: Algernon Huxley, MD;  Location: [facility] ORS;  Service: Vascular;  Laterality: Left;   ENDOVASCULAR STENT INSERTION Bilateral    Legs   FLEXIBLE BRONCHOSCOPY N/A 02/02/2015   Procedure: FLEXIBLE BRONCHOSCOPY;  Surgeon: Allyne Gee, MD;  Location: [facility] ORS;  Service: Pulmonary;  Laterality: N/A;   HEMORRHOID SURGERY     PERIPHERAL VASCULAR CATHETERIZATION Left 04/25/2015   Procedure: Lower Extremity Angiography;  Surgeon: Algernon Huxley, MD;  Location: [place] [facility];  Service: Cardiovascular;  Laterality: Left;   PERIPHERAL VASCULAR CATHETERIZATION Left 04/25/2015   Procedure: Lower Extremity Intervention;  Surgeon: Algernon Huxley, MD;  Location: [place] [facility];  Service: Cardiovascular;  Laterality: Left;   PERIPHERAL VASCULAR  CATHETERIZATION Left 01/02/2016   Procedure: Lower Extremity Angiography;  Surgeon: Algernon Huxley, MD;  Location: [place] [facility];  Service: Cardiovascular;  Laterality: Left;   PERIPHERAL VASCULAR CATHETERIZATION  01/02/2016   Procedure: Lower Extremity Intervention;  Surgeon: Algernon Huxley, MD;  Location: [place] [facility];  Service: Cardiovascular;;   THROMBECTOMY FEMORAL ARTERY Left 04/26/2015   Procedure: THROMBECTOMY FEMORAL ARTERY;  Surgeon: Algernon Huxley, MD;  Location: [facility] ORS;  Service: Vascular;  Laterality: Left;     reports that she has been smoking cigarettes. She has a 41.00 pack-year smoking history. She has never used smokeless tobacco. She reports current alcohol use. She reports that she does not use drugs.  Allergies  Allergen Reactions   Aspirin Nausea And Vomiting and Other (See Comments)    Pt states aspirin makes her cramp and have to use coated kind.   Tramadol    Zyrtec [Cetirizine]     Family History  Problem Relation Age of Onset   Hypertension Mother    Hypertension Father     Prior to Admission medications   Medication Sig Start Date End Date Taking? Authorizing Provider  albuterol (VENTOLIN HFA) 108 (90 Base) MCG/ACT inhaler Inhale 2 puffs into the lungs every 6 (six) hours as needed for wheezing or shortness of breath. 05/19/21   Jonetta Osgood, NP  amoxicillin (AMOXIL) 500 MG capsule Take 500 mg by mouth 3 (three) times daily. 08/09/21   [provider]  aspirin EC 81 MG EC tablet Take 1 tablet (81 mg total) by mouth daily. 04/29/15   Algernon Huxley, MD  atorvastatin (LIPITOR) 80 MG tablet Take 1 tablet (80 mg total) by mouth daily. 05/19/21   Jonetta Osgood, NP  benzonatate (TESSALON) 100 MG capsule Take 1 capsule (100 mg total) by mouth 2 (two) times daily as needed for cough. 08/09/21   Jennye Boroughs, MD  budesonide-formoterol (SYMBICORT) 80-4.5 MCG/ACT inhaler Inhale 2 puffs into the lungs daily. 05/19/21   Jonetta Osgood, NP   cholecalciferol (VITAMIN D) 400 units TABS tablet Take 400 Units by mouth.    [provider]  ezetimibe (ZETIA) 10 MG tablet Take 1 tablet (10 mg total) by mouth daily. 05/19/21   Jonetta Osgood, NP  ferrous sulfate 325 (65 FE) MG tablet Take 325 mg by mouth daily.    [provider]  isosorbide mononitrate (IMDUR) 60 MG 24 hr tablet Take 1 tablet (60 mg total) by mouth daily. 08/09/21   Jennye Boroughs, MD  losartan (COZAAR) 50 MG tablet Take 2 tablets (100 mg total) by mouth daily. 09/18/21   Jonetta Osgood, NP  REPATHA SURECLICK 315 MG/ML SOAJ Inject 140 mg into the skin every 14 (fourteen) days. 07/29/21   [provider]    Physical Exam: Vitals:   09/23/21  2056 09/23/21 2130 09/23/21 2200 09/23/21 2230  BP: (!) 92/56 121/69 110/70 126/73  Pulse: 88 90 88 86  Resp: 18 16 18 19   Temp:      SpO2: 91% 91% 93% 95%  Weight:      Height:       Physical Exam Vitals reviewed.  Constitutional:      General: She is not in acute distress.    Appearance: She is not ill-appearing.  HENT:     Head: Normocephalic and atraumatic.     Right Ear: External ear normal.     Left Ear: External ear normal.     Nose: Nose normal.     Mouth/Throat:     Mouth: Mucous membranes are dry.  Eyes:     Extraocular Movements: Extraocular movements intact.     Pupils: Pupils are equal, round, and reactive to light.  Neck:     Vascular: Carotid bruit present.  Cardiovascular:     Rate and Rhythm: Normal rate and regular rhythm.     Pulses: Normal pulses.     Heart sounds: Normal heart sounds.  Pulmonary:     Effort: Pulmonary effort is normal.     Breath sounds: Normal breath sounds.  Abdominal:     General: Abdomen is flat. Bowel sounds are normal. There is no distension.     Palpations: Abdomen is soft. There is no mass.     Tenderness: There is no abdominal tenderness. There is no guarding.     Hernia: No hernia is present.  Musculoskeletal:     Right lower leg:  No edema.     Left lower leg: No edema.  Skin:    General: Skin is warm.  Neurological:     General: No focal deficit present.     Mental Status: She is oriented to person, place, and time.  Psychiatric:        Mood and Affect: Mood normal.        Behavior: Behavior normal.    Labs on Admission: I have personally reviewed following labs and imaging studies No results for input(s): CKTOTAL, CKMB, TROPONINI in the last 72 hours. Lab Results  Component Value Date   WBC 5.4 09/23/2021   HGB 11.9 (L) 09/23/2021   HCT 37.1 09/23/2021   MCV 103.6 (H) 09/23/2021   PLT 142 (L) 09/23/2021    Recent Labs  Lab 09/23/21 1902  NA 138  K 3.2*  CL 105  CO2 19*  BUN 16  CREATININE 1.42*  CALCIUM 8.6*  PROT 6.7  BILITOT 0.2*  ALKPHOS 54  ALT 16  AST 21  GLUCOSE 78   Lab Results  Component Value Date   CHOL 63 08/06/2021   HDL 46 08/06/2021   LDLCALC 8 08/06/2021   TRIG 44 08/06/2021   No results found for: DDIMER Invalid input(s): POCBNP   COVID-19 Labs No results for input(s): DDIMER, FERRITIN, LDH, CRP in the last 72 hours. Lab Results  Component Value Date   SARSCOV2NAA NEGATIVE 08/05/2021   [place] NEGATIVE 07/01/2021   [place] NEGATIVE 12/31/2020   [place] NEGATIVE 01/01/2020    Radiological Exams on Admission: CT Head Wo Contrast  Result Date: 09/23/2021 CLINICAL DATA:  Mental status change syncope EXAM: CT HEAD WITHOUT CONTRAST TECHNIQUE: Contiguous axial images were obtained from the base of the skull through the vertex without intravenous contrast. RADIATION DOSE REDUCTION: This exam was performed according to the departmental dose-optimization program which includes automated exposure control, adjustment of the mA  and/or kV according to patient size and/or use of iterative reconstruction technique. COMPARISON:  CT brain 10/15/2020 FINDINGS: Brain: No acute territorial infarction, hemorrhage, or intracranial mass. The ventricles are nonenlarged.  Vascular: No hyperdense vessels.  Carotid vascular calcification Skull: Normal. Negative for fracture or focal lesion. Sinuses/Orbits: No acute finding. Other: None IMPRESSION: Negative non contrasted CT appearance of the brain. Electronically Signed   By: Donavan Foil M.D.   On: 09/23/2021 19:42   DG Chest Portable 1 View  Result Date: 09/23/2021 CLINICAL DATA:  Syncope EXAM: PORTABLE CHEST 1 VIEW COMPARISON:  08/29/2021 FINDINGS: Parenchymal scarring at the right lung base is unchanged. The lungs are stably hyperinflated in keeping with changes of underlying emphysema with mild bulla formation at the right apex noted. No superimposed confluent pulmonary infiltrate. No pneumothorax or pleural effusion. Cardiac size within normal limits. Pulmonary vascularity is normal. No acute bone abnormality. IMPRESSION: No active disease.  Emphysema. Electronically Signed   By: Fidela Salisbury M.D.   On: 09/23/2021 21:26    EKG: Independently reviewed.  As above.   Assessment/Plan: Principal Problem:   Syncope and collapse Active Problems:   Alcohol abuse   Essential hypertension   Tobacco use disorder   Lactic acidosis   AKI (acute kidney injury) (HCC)   COPD exacerbation (HCC)   Anemia   Syncope and collapse: D/d incldue cardiac or neurological etiology.  In this pt she will benefit from MRI or Head and MRA neck. We will obtain 2 d echo. Aspiration/ fall/ seizure precaution.   Alcohol abuse: Iv thiamine. B12 level, ethanol.  PT prior to d/c.  CIWA.   HTN: Blood pressure 126/73, pulse 86, temperature (!) 97.4 F (36.3 C), resp. rate 19, height 5\' 4"  (1.626 m), weight 50.9 kg, SpO2 95 %. We will continue patient on Imdur,cozaar.   Tobacco abuse: Nicotine patch.    Lactic acidosis: Attribute to her alcohol abuse.    AKI: Lab Results  Component Value Date   CREATININE 1.42 (H) 09/23/2021   CREATININE 0.74 08/10/2021   CREATININE 0.72 08/09/2021  Hold pt's ARB therapy.  Start  patient on LR at 75 cc.  COPD:stable we will continue ventolin / Symbicort.    DVT prophylaxis:  Heparin    Code Status:  Full code    Family Communication:  Traniece, [place] Engineer, petroleum)  [phone] (Home Phone)   Disposition Plan:  Home    Consults called:  None   Admission status: Inpatient.     Medical Decision Making   Coding    Para Skeans MD [facility]  6 PM- 2 AM. Please contact me via secure Chat 6 PM-2 AM. To contact the [facility] Attending or Consulting provider [place] or covering provider during after hours [place], for this patient.   Check the care team in [facility] and look for a) attending/consulting [facility] provider listed and b) the [facility] team listed Log into [email or web address] and use [place]'s universal password to access. If you do not have the password, please contact the hospital operator. Locate the [facility] provider you are looking for under [facility] and page to a number that you can be directly reached. If you still have difficulty reaching the provider, please page the [facility] (Director on Call) for the Hospitalists listed on amion for assistance. [email or web address] 09/23/2021, 11:30 PM

## 2021-09-23 NOTE — ED Notes (Signed)
Critical Lactic 3.7; MD Malinda notified

## 2021-09-23 NOTE — ED Provider Notes (Signed)
[facility] [place] Provider Note    Event Date/Time   First MD Initiated Contact with Patient 09/23/21 1851     (approximate)   History   Near Syncope   HPI  [name] is a 66 y.o. female who reports she was in the kitchen working and all of a sudden woke up on the floor.  She did not have any premonitory symptoms.  She does not think she was out for very long.  Her daughter lives with her and saw her in asked her if everything was okay and she said yes and try to get up again was getting up holding onto the kitchen sink because she was very weak and passed out again but was caught and lowered to the floor.  Reportedly fire department arrived on scene first and found her systolic blood pressure in the 70s.  EMS arrived afterwards and found her systolic blood pressure to be in the 90s.  Now its in the 109 range.  Patient reports chest wall tenderness but no other symptoms except for feeling weak all over.      Physical Exam   Triage Vital Signs: ED Triage Vitals  Enc Vitals Group     BP      Pulse      Resp      Temp      Temp src      SpO2      Weight      Height      Head Circumference      Peak Flow      Pain Score      Pain Loc      Pain Edu?      Excl. in [place]?     Most recent vital signs: Vitals:   09/23/21 1900 09/23/21 2056  BP: 96/63 (!) 92/56  Pulse: 83 88  Resp: 19 18  Temp:    SpO2: 92% 91%    General: Awake, no distress.  Alert CV:  Good peripheral perfusion.  Heart regular rate and rhythm no audible murmurs Resp:  Normal effort.  Lungs are clear Chest: Chest wall is very tender to light touch Abd:  No distention.  Abdomen soft bowel sounds positive nontender Extremities: No edema   ED Results / Procedures / Treatments   Labs (all labs ordered are listed, but only abnormal results are displayed) Labs Reviewed  COMPREHENSIVE METABOLIC PANEL - Abnormal; Notable for the following components:      Result Value   Potassium  3.2 (*)    CO2 19 (*)    Creatinine, Ser 1.42 (*)    Calcium 8.6 (*)    Total Bilirubin 0.2 (*)    GFR, Estimated 41 (*)    All other components within normal limits  LACTIC ACID, PLASMA - Abnormal; Notable for the following components:   Lactic Acid, Venous 3.7 (*)    All other components within normal limits  LACTIC ACID, PLASMA - Abnormal; Notable for the following components:   Lactic Acid, Venous 2.4 (*)    All other components within normal limits  CBC WITH DIFFERENTIAL/PLATELET - Abnormal; Notable for the following components:   RBC 3.58 (*)    Hemoglobin 11.9 (*)    MCV 103.6 (*)    RDW 15.6 (*)    Platelets 142 (*)    All other components within normal limits  RESP PANEL BY RT-PCR (FLU A&B, COVID) ARPGX2  URINALYSIS, ROUTINE W REFLEX MICROSCOPIC  TROPONIN I (HIGH  SENSITIVITY)  TROPONIN I (HIGH SENSITIVITY)     EKG  EKG read interpreted by me shows normal sinus rhythm rate of 77 rightward axis nonspecific ST-T wave changes very similar to EKG from December.   RADIOLOGY Chest x-ray read by radiology reviewed by me shows no acute disease Head CT read by radiology reviewed by me shows no acute problems   PROCEDURES:  Critical Care performed: Critical care time 20 minutes.  This includes talking to the patient and family members reviewing her chart going back and checking on her progression.  I have also ordered some more tests once we get the lactic acid back. Procedures   MEDICATIONS ORDERED IN ED: Medications  0.9 %  sodium chloride infusion (has no administration in time range)  sodium chloride 0.9 % bolus 1,000 mL (1,000 mLs Intravenous Bolus 09/23/21 2106)     IMPRESSION / MDM / ASSESSMENT AND PLAN / ED COURSE  I reviewed the triage vital signs and the nursing notes.                              Patient with syncope and low blood pressure.  The differential diagnosis includes arrhythmia, MI, sepsis, PE.  Based on the history patient does appear to be  slightly dehydrated because her BUN and creatinine are higher and her GFR is lower than previously giving her diagnosis of AKI.  Chest x-ray is clear head CT is clear for surgery and she has no fever or white count I do not believe she is septic.  Her H&H are stable so I do not think she is anemic which could be another item in the differential diagnosis.  Patient's lactic acid is up on not sure why possibly she is just dehydrated.  There is no evidence of sepsis at this point.  Urinalysis is still pending however.  Patient is not febrile has not had any shaking chills or aches or pains.   The patient is on the cardiac monitor to evaluate for evidence of arrhythmia and/or significant heart rate changes.  None have been seen to this point. I will give the patient some fluids to see if this helps.  Old records have been reviewed.  She had an admission last month for community-acquired pneumonia.  She does not have any trouble breathing currently, not coughing and again no fever or white count.  With the syncope and elevated lactic acid lower blood pressure will have to admit her though.  We will have to evaluate her further and make sure there is no hidden problem.  The COVID test is also pending along with a urine.  I have to get those back.       FINAL CLINICAL IMPRESSION(S) / ED DIAGNOSES   Final diagnoses:  Near syncope  Syncope and collapse  AKI (acute kidney injury) ([place])     Rx / DC Orders   ED Discharge Orders     None        Note:  This document was prepared using Dragon voice recognition software and may include unintentional dictation errors.   Nena Polio, MD 09/23/21 2200

## 2021-09-24 ENCOUNTER — Inpatient Hospital Stay
Admit: 2021-09-24 | Discharge: 2021-09-24 | Disposition: A | Discharge status: Home / Self Care | Payer: Medicare Other | Attending: Internal Medicine | Admitting: Internal Medicine

## 2021-09-24 DIAGNOSIS — F1010 Alcohol abuse, uncomplicated: Secondary | ICD-10-CM

## 2021-09-24 DIAGNOSIS — F10929 Alcohol use, unspecified with intoxication, unspecified: Secondary | ICD-10-CM | POA: Diagnosis present

## 2021-09-24 DIAGNOSIS — J441 Chronic obstructive pulmonary disease with (acute) exacerbation: Secondary | ICD-10-CM

## 2021-09-24 DIAGNOSIS — F10221 Alcohol dependence with intoxication delirium: Secondary | ICD-10-CM | POA: Diagnosis not present

## 2021-09-24 DIAGNOSIS — R55 Syncope and collapse: Secondary | ICD-10-CM | POA: Diagnosis not present

## 2021-09-24 DIAGNOSIS — F17200 Nicotine dependence, unspecified, uncomplicated: Secondary | ICD-10-CM

## 2021-09-24 DIAGNOSIS — I10 Essential (primary) hypertension: Secondary | ICD-10-CM | POA: Diagnosis not present

## 2021-09-24 DIAGNOSIS — E872 Acidosis, unspecified: Secondary | ICD-10-CM | POA: Diagnosis not present

## 2021-09-24 DIAGNOSIS — N179 Acute kidney failure, unspecified: Secondary | ICD-10-CM | POA: Diagnosis not present

## 2021-09-24 LAB — CBC
HCT: 31.9 % — ABNORMAL LOW (ref 36.0–46.0)
HCT: 34.2 % — ABNORMAL LOW (ref 36.0–46.0)
Hemoglobin: 10.5 g/dL — ABNORMAL LOW (ref 12.0–15.0)
Hemoglobin: 11.2 g/dL — ABNORMAL LOW (ref 12.0–15.0)
MCH: 32.6 pg (ref 26.0–34.0)
MCH: 33.7 pg (ref 26.0–34.0)
MCHC: 32.7 g/dL (ref 30.0–36.0)
MCHC: 32.9 g/dL (ref 30.0–36.0)
MCV: 102.2 fL — ABNORMAL HIGH (ref 80.0–100.0)
MCV: 99.4 fL (ref 80.0–100.0)
Platelets: 121 10*3/uL — ABNORMAL LOW (ref 150–400)
Platelets: 138 10*3/uL — ABNORMAL LOW (ref 150–400)
RBC: 3.12 MIL/uL — ABNORMAL LOW (ref 3.87–5.11)
RBC: 3.44 MIL/uL — ABNORMAL LOW (ref 3.87–5.11)
RDW: 15.5 % (ref 11.5–15.5)
RDW: 15.6 % — ABNORMAL HIGH (ref 11.5–15.5)
WBC: 4.1 10*3/uL (ref 4.0–10.5)
WBC: 4.5 10*3/uL (ref 4.0–10.5)
nRBC: 0 % (ref 0.0–0.2)
nRBC: 0 % (ref 0.0–0.2)

## 2021-09-24 LAB — BASIC METABOLIC PANEL
Anion gap: 9 (ref 5–15)
BUN: 14 mg/dL (ref 8–23)
CO2: 21 mmol/L — ABNORMAL LOW (ref 22–32)
Calcium: 8.6 mg/dL — ABNORMAL LOW (ref 8.9–10.3)
Chloride: 109 mmol/L (ref 98–111)
Creatinine, Ser: 0.86 mg/dL (ref 0.44–1.00)
GFR, Estimated: >60 mL/min (ref >60)
Glucose, Bld: 88 mg/dL (ref 70–99)
Potassium: 4.1 mmol/L (ref 3.5–5.1)
Sodium: 139 mmol/L (ref 135–145)

## 2021-09-24 LAB — URINE DRUG SCREEN, QUALITATIVE (ARMC ONLY)
Amphetamines, Ur Screen: NOT DETECTED
Barbiturates, Ur Screen: NOT DETECTED
Benzodiazepine, Ur Scrn: NOT DETECTED
Cannabinoid 50 Ng, Ur ~~LOC~~: NOT DETECTED
Cocaine Metabolite,Ur ~~LOC~~: NOT DETECTED
MDMA (Ecstasy)Ur Screen: NOT DETECTED
Methadone Scn, Ur: NOT DETECTED
Opiate, Ur Screen: NOT DETECTED
Phencyclidine (PCP) Ur S: NOT DETECTED
Tricyclic, Ur Screen: NOT DETECTED

## 2021-09-24 LAB — CREATININE, SERUM
Creatinine, Ser: 1.06 mg/dL — ABNORMAL HIGH (ref 0.44–1.00)
GFR, Estimated: 58 mL/min — ABNORMAL LOW (ref >60)

## 2021-09-24 LAB — TSH: TSH: 0.59 u[IU]/mL (ref 0.350–4.500)

## 2021-09-24 LAB — CK: Total CK: 125 U/L (ref 38–234)

## 2021-09-24 LAB — ETHANOL: Alcohol, Ethyl (B): 82 mg/dL — ABNORMAL HIGH (ref <10)

## 2021-09-24 LAB — GLUCOSE, CAPILLARY: Glucose-Capillary: 84 mg/dL (ref 70–99)

## 2021-09-24 MED ORDER — LOSARTAN POTASSIUM 50 MG PO TABS
100.0000 mg | ORAL_TABLET | Freq: Every day | ORAL | Status: DC
  Administered 2021-09-24: 100 mg via ORAL
  Filled 2021-09-24: qty 2

## 2021-09-24 MED ORDER — LORAZEPAM 1 MG PO TABS: 1.0000 mg | ORAL_TABLET | ORAL | Status: DC | PRN

## 2021-09-24 MED ORDER — THIAMINE HCL 100 MG/ML IJ SOLN: 100.0000 mg | Freq: Every day | INTRAMUSCULAR | Status: DC

## 2021-09-24 MED ORDER — FOLIC ACID 1 MG PO TABS: 1.0000 mg | ORAL_TABLET | Freq: Every day | ORAL | 1 refills | Status: DC

## 2021-09-24 MED ORDER — THIAMINE HCL 100 MG PO TABS: 100.0000 mg | ORAL_TABLET | Freq: Every day | ORAL | 0 refills | Status: DC

## 2021-09-24 MED ORDER — ADULT MULTIVITAMIN W/MINERALS CH: 1.0000 | ORAL_TABLET | Freq: Every day | ORAL | Status: DC

## 2021-09-24 MED ORDER — ADULT MULTIVITAMIN W/MINERALS CH
1.0000 | ORAL_TABLET | Freq: Every day | ORAL | Status: DC
  Administered 2021-09-24: 1 via ORAL
  Filled 2021-09-24: qty 1

## 2021-09-24 MED ORDER — ORAL CARE MOUTH RINSE: 15.0000 mL | Freq: Two times a day (BID) | OROMUCOSAL | Status: DC

## 2021-09-24 MED ORDER — CHLORHEXIDINE GLUCONATE 0.12 % MT SOLN: 15.0000 mL | Freq: Two times a day (BID) | OROMUCOSAL | 0 refills | Status: DC

## 2021-09-24 MED ORDER — LORAZEPAM 2 MG/ML IJ SOLN: 1.0000 mg | INTRAMUSCULAR | Status: DC | PRN

## 2021-09-24 MED ORDER — FOLIC ACID 1 MG PO TABS
1.0000 mg | ORAL_TABLET | Freq: Every day | ORAL | Status: DC
  Administered 2021-09-24: 1 mg via ORAL
  Filled 2021-09-24: qty 1

## 2021-09-24 MED ORDER — THIAMINE HCL 100 MG PO TABS
100.0000 mg | ORAL_TABLET | Freq: Every day | ORAL | Status: DC
  Administered 2021-09-24: 100 mg via ORAL
  Filled 2021-09-24: qty 1

## 2021-09-24 MED ORDER — PNEUMOCOCCAL VAC POLYVALENT 25 MCG/0.5ML IJ INJ: 0.5000 mL | INJECTION | INTRAMUSCULAR | Status: DC

## 2021-09-24 NOTE — Progress Notes (Signed)
OT Cancellation Note  Patient Details Name: [name] MRN: [id number] DOB: 06-07-1956   Cancelled Treatment:    Reason Eval/Treat Not Completed: OT screened, no needs identified, will sign off. Order received. Per chart review and discussion with MD, pt at baseline functional independence and no acute OT needs at this time. OT to sign off. Please re-consult should additional needs arise.   Fredirick Maudlin, OTR/L [place]

## 2021-09-24 NOTE — Care Management CC44 (Signed)
Condition Code 44 Documentation Completed  Patient Details  Name: Analiah Drum MRN: [id number] Date of Birth: 03/28/1956   Condition Code 44 given:  Yes Patient signature on Condition Code 44 notice:  Yes Documentation of 2 MD's agreement:  Yes Code 44 added to claim:  Yes  Reviewed with patient via phone. Printed copy for patient's records.   [place], LCSW 09/24/2021, 9:59 AM
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
## 2021-09-24 NOTE — Discharge Summary (Signed)
Physician Discharge Summary  [name] LKG:[id number] DOB: June 28, 1956 DOA: 09/23/2021  PCP: Jonetta Osgood, NP  Admit date: 09/23/2021 Discharge date: 09/24/2021 Admitted From: Home Disposition: Home Recommendations for Outpatient Follow-up:  Follow ups as below. Please obtain CBC/BMP/Mag at follow up Please follow up on the following pending results: Echocardiogram Home Health: Not indicated Equipment/Devices: Not indicated Discharge Condition: Stable CODE STATUS: Full code  Follow-up Information     Jonetta Osgood, NP. Schedule an appointment as soon as possible for a visit in 1 week(s).   Specialty: Nurse Practitioner Why: As needed Contact information: [place] [address] [phone]                Hospital Course: 66 year old F with PMH of EtOH use disorder, tobacco use disorder, COPD, pneumonia, hypertension, dental caries status post recent dental extraction presenting with syncopal episode.  Reportedly hypotensive to 70s when EMS arrived.  SBP improved to 90s in ED and improved further with IV hydration.  CT head without acute finding.  Cr 1.42 (baseline 0.7).  K3.2.  Lactic acid 3.7 but improved to 2.4 after hydration.  EtOH level elevated to 82.  Serial troponin negative.  EKG without acute ischemic finding.  CBC with macrocytic anemia.   The next day, orthostatic vitals negative.  AKI and hypokalemia resolved.  Ambulated with therapy without problem.  She was counseled on alcohol and tobacco cessation, and discharged home for outpatient follow-up with PCP as needed.  TTE results pending.   See individual problem list below for more on hospital course.  Discharge Diagnoses:  Syncopal episode likely due to dehydration in the setting of alcohol intoxication: Patient admits to drinking unknown quantity of beer and whiskey the day prior.  She was hypotensive on arrival.  Alcohol level elevated to 82.  No withdrawal symptoms. -Counseled on  alcohol cessation -Multivitamin, folic acid and thiamine -Provided with resources. -TTE results pending.  Lactic acidosis: Likely from EtOH and dehydration.  AKI: Resolved.  Hypokalemia: Resolved.    Chronic COPD: Stable. -Continue home breathing treatment  Tobacco use disorder: Reports smoking about a pack a day. -Encouraged cessation.  Essential hypertension: BP elevated likely from IV fluid. -Continue home meds.  Dental caries/recent dental extractions-some tenderness and fullness under her chin bilaterally.  Doubt infectious process without fever or leukocytosis. -Chlorhexidine mouth rinse -Outpatient follow-up with her dentist.  Macrocytic anemia: Likely from alcohol. -O75 and folic acid  Moderate malnutrition: Likely due to alcohol. Body mass index is 19.47 kg/m.           Discharge Exam: Vitals:   09/24/21 0039 09/24/21 0421 09/24/21 0500 09/24/21 1215  BP: (!) 144/79 (!) 144/63  (!) 161/84  Pulse: 76 91  86  Temp: 97.7 F (36.5 C) 98.2 F (36.8 C)    Resp: (!) 24 20  20   Height:      Weight:   51.4 kg   SpO2: 99% 94%  96%  TempSrc: Axillary     BMI (Calculated):   19.46      GENERAL: No apparent distress.  Nontoxic. HEENT: MMM.  Poor dentition.  Dental caries. NECK: Supple.  Full range of motion.  Some tenderness over submandibular glands bilaterally.  No erythema loculation. RESP: 96% on RA.  No IWOB.  Fair aeration bilaterally. CVS:  RRR. Heart sounds normal.  ABD/GI/GU: Bowel sounds present. Soft. Non tender.  MSK/EXT:  Moves extremities. No apparent deformity. No edema.  SKIN: no apparent skin lesion or wound NEURO: Awake and  alert.  Oriented appropriately.  No apparent focal neuro deficit. PSYCH: Calm. Normal affect.   Discharge Instructions  Discharge Instructions     Diet - low sodium heart healthy   Complete by: As directed    Continue mechanically soft diet   Discharge instructions   Complete by: As directed    It has been a  pleasure taking care of you!  You were hospitalized after you passed out likely from alcohol.  Your blood alcohol level is markedly elevated.  We strongly recommend you avoid drinking alcohol.  Please use the resources you have been provided for help with alcohol cessation/moderation.  In regards to your teeth and swelling under you chin, we sent a prescription for mouth wash. You may try hold small lemonade in your mouth to help. If no improvement, please call your dentist for further guidance  It is important that you quit smoking cigarettes.  You may use nicotine patch to help you quit smoking.  Nicotine patch is available over-the-counter.  You may also discuss other options to help you quit smoking with your primary care doctor. You can also talk to professional counselors at 1-800-QUIT-NOW 727-083-9198) for free smoking cessation counseling.     Take care,   Increase activity slowly   Complete by: As directed       Allergies as of 09/24/2021       Reactions   Aspirin Nausea And Vomiting, Other (See Comments)   Pt states aspirin makes her cramp and have to use coated kind.   Tramadol    Zyrtec [cetirizine]         Medication List     STOP taking these medications    amoxicillin 500 MG capsule Commonly known as: AMOXIL   benzonatate 100 MG capsule Commonly known as: TESSALON       TAKE these medications    albuterol 108 (90 Base) MCG/ACT inhaler Commonly known as: VENTOLIN HFA Inhale 2 puffs into the lungs every 6 (six) hours as needed for wheezing or shortness of breath.   aspirin 81 MG EC tablet Take 1 tablet (81 mg total) by mouth daily.   atorvastatin 80 MG tablet Commonly known as: LIPITOR Take 1 tablet (80 mg total) by mouth daily.   budesonide-formoterol 80-4.5 MCG/ACT inhaler Commonly known as: SYMBICORT Inhale 2 puffs into the lungs daily.   chlorhexidine 0.12 % solution Commonly known as: PERIDEX Use as directed 15 mLs in the mouth or  throat 2 (two) times daily.   cholecalciferol 10 MCG (400 UNIT) Tabs tablet Commonly known as: VITAMIN D3 Take 400 Units by mouth.   ezetimibe 10 MG tablet Commonly known as: ZETIA Take 1 tablet (10 mg total) by mouth daily.   ferrous sulfate 325 (65 FE) MG tablet Take 325 mg by mouth daily.   folic acid 1 MG tablet Commonly known as: FOLVITE Take 1 tablet (1 mg total) by mouth daily. Start taking on: September 25, 2021   isosorbide mononitrate 60 MG 24 hr tablet Commonly known as: IMDUR Take 1 tablet (60 mg total) by mouth daily.   losartan 50 MG tablet Commonly known as: COZAAR Take 2 tablets (100 mg total) by mouth daily.   multivitamin with minerals Tabs tablet Take 1 tablet by mouth daily. Start taking on: September 26, 3327   Repatha SureClick 518 MG/ML [place] Generic drug: Evolocumab Inject 140 mg into the skin every 14 (fourteen) days.   thiamine 100 MG tablet Take 1 tablet (100 mg total) by mouth  daily. Start taking on: September 25, 2021        Consultations: None  Procedures/Studies:   DG Chest 2 View  Result Date: 08/29/2021 CLINICAL DATA:  Pneumonia. EXAM: CHEST - 2 VIEW COMPARISON:  Chest XR, 08/05/2021.  CT chest, 12/31/2020. FINDINGS: Cardiomediastinal silhouette is within normal limits. Aortic atherosclerosis. Hyperinflation with relative flattening of the lung bases. Decreasing conspicuity of middle lobe patchy opacity. No pleural effusion or pneumothorax. Breast shadows. No interval osseous abnormality. IMPRESSION: 1. Decreasing conspicuity of middle lobe opacity, likely reflecting resolving pneumonia. 2. Obstructive lung findings. 3. Aortic Atherosclerosis (ICD10-I70.0) and Emphysema (ICD10-J43.9). Electronically Signed   By: Michaelle Birks M.D.   On: 08/29/2021 07:52   CT Head Wo Contrast  Result Date: 09/23/2021 CLINICAL DATA:  Mental status change syncope EXAM: CT HEAD WITHOUT CONTRAST TECHNIQUE: Contiguous axial images were obtained from the base  of the skull through the vertex without intravenous contrast. RADIATION DOSE REDUCTION: This exam was performed according to the departmental dose-optimization program which includes automated exposure control, adjustment of the mA and/or kV according to patient size and/or use of iterative reconstruction technique. COMPARISON:  CT brain 10/15/2020 FINDINGS: Brain: No acute territorial infarction, hemorrhage, or intracranial mass. The ventricles are nonenlarged. Vascular: No hyperdense vessels.  Carotid vascular calcification Skull: Normal. Negative for fracture or focal lesion. Sinuses/Orbits: No acute finding. Other: None IMPRESSION: Negative non contrasted CT appearance of the brain. Electronically Signed   By: Donavan Foil M.D.   On: 09/23/2021 19:42   DG Chest Portable 1 View  Result Date: 09/23/2021 CLINICAL DATA:  Syncope EXAM: PORTABLE CHEST 1 VIEW COMPARISON:  08/29/2021 FINDINGS: Parenchymal scarring at the right lung base is unchanged. The lungs are stably hyperinflated in keeping with changes of underlying emphysema with mild bulla formation at the right apex noted. No superimposed confluent pulmonary infiltrate. No pneumothorax or pleural effusion. Cardiac size within normal limits. Pulmonary vascularity is normal. No acute bone abnormality. IMPRESSION: No active disease.  Emphysema. Electronically Signed   By: Fidela Salisbury M.D.   On: 09/23/2021 21:26       The results of significant diagnostics from this hospitalization (including imaging, microbiology, ancillary and laboratory) are listed below for reference.     Microbiology: Recent Results (from the past 240 hour(s))  Resp Panel by RT-PCR (Flu A&B, Covid) Nasopharyngeal Swab     Status: None   Collection Time: 09/23/21 10:49 PM   Specimen: Nasopharyngeal Swab; Nasopharyngeal(NP) swabs in vial transport medium  Result Value Ref Range Status   SARS Coronavirus 2 by RT PCR NEGATIVE NEGATIVE Final    Comment: (NOTE) SARS-CoV-2  target nucleic acids are NOT DETECTED.  The SARS-CoV-2 RNA is generally detectable in upper respiratory specimens during the acute phase of infection. The lowest concentration of SARS-CoV-2 viral copies this assay can detect is 138 copies/mL. A negative result does not preclude SARS-Cov-2 infection and should not be used as the sole basis for treatment or other patient management decisions. A negative result may occur with  improper specimen collection/handling, submission of specimen other than nasopharyngeal swab, presence of viral mutation(s) within the areas targeted by this assay, and inadequate number of viral copies(<138 copies/mL). A negative result must be combined with clinical observations, patient history, and epidemiological information. The expected result is Negative.  Fact Sheet for Patients:  EntrepreneurPulse.com.au  Fact Sheet for Healthcare Providers:  IncredibleEmployment.be  This test is no t yet approved or cleared by the [place] and  has been authorized for  detection and/or diagnosis of SARS-CoV-2 by FDA under an Emergency Use Authorization (EUA). This EUA will remain  in effect (meaning this test can be used) for the duration of the COVID-19 declaration under Section 564(b)(1) of the Act, 21 U.S.C.section 360bbb-3(b)(1), unless the authorization is terminated  or revoked sooner.       Influenza A by PCR NEGATIVE NEGATIVE Final   Influenza B by PCR NEGATIVE NEGATIVE Final    Comment: (NOTE) The Xpert Xpress SARS-CoV-2/FLU/RSV plus assay is intended as an aid in the diagnosis of influenza from Nasopharyngeal swab specimens and should not be used as a sole basis for treatment. Nasal washings and aspirates are unacceptable for Xpert Xpress SARS-CoV-2/FLU/RSV testing.  Fact Sheet for Patients: EntrepreneurPulse.com.au  Fact Sheet for Healthcare  Providers: IncredibleEmployment.be  This test is not yet approved or cleared by the [place] FDA and has been authorized for detection and/or diagnosis of SARS-CoV-2 by FDA under an Emergency Use Authorization (EUA). This EUA will remain in effect (meaning this test can be used) for the duration of the COVID-19 declaration under Section 564(b)(1) of the Act, 21 U.S.C. section 360bbb-3(b)(1), unless the authorization is terminated or revoked.  Performed at [facility], [address] [place], [place] [address]      Labs:  CBC: Recent Labs  Lab 09/23/21 1902 09/24/21 0019 09/24/21 0638  WBC 5.4 4.5 4.1  NEUTROABS 3.6  --   --   HGB 11.9* 10.5* 11.2*  HCT 37.1 31.9* 34.2*  MCV 103.6* 102.2* 99.4  PLT 142* 121* 138*   BMP &GFR Recent Labs  Lab 09/23/21 1902 09/24/21 0019 09/24/21 0638  NA 138  --  139  K 3.2*  --  4.1  CL 105  --  109  CO2 19*  --  21*  GLUCOSE 78  --  88  BUN 16  --  14  CREATININE 1.42* 1.06* 0.86  CALCIUM 8.6*  --  8.6*   Estimated Creatinine Clearance: 52.9 mL/min (by C-G formula based on SCr of 0.86 mg/dL). Liver & Pancreas: Recent Labs  Lab 09/23/21 1902  AST 21  ALT 16  ALKPHOS 54  BILITOT 0.2*  PROT 6.7  ALBUMIN 3.6   No results for input(s): LIPASE, AMYLASE in the last 168 hours. No results for input(s): AMMONIA in the last 168 hours. Diabetic: No results for input(s): HGBA1C in the last 72 hours. Recent Labs  Lab 09/24/21 0524  GLUCAP 84   Cardiac Enzymes: Recent Labs  Lab 09/24/21 0638  CKTOTAL 125   No results for input(s): PROBNP in the last 8760 hours. Coagulation Profile: No results for input(s): INR, PROTIME in the last 168 hours. Thyroid Function Tests: Recent Labs    09/24/21 0019  TSH 0.590   Lipid Profile: No results for input(s): CHOL, HDL, LDLCALC, TRIG, CHOLHDL, LDLDIRECT in the last 72 hours. Anemia Panel: No results for input(s): VITAMINB12, FOLATE, FERRITIN,  TIBC, IRON, RETICCTPCT in the last 72 hours. Urine analysis:    Component Value Date/Time   COLORURINE YELLOW (A) 09/23/2021 2249   APPEARANCEUR HAZY (A) 09/23/2021 2249   APPEARANCEUR Turbid (A) 05/19/2021 1551   LABSPEC 1.009 09/23/2021 2249   LABSPEC 1.015 10/15/2013 1124   PHURINE 5.0 09/23/2021 [place] 09/23/2021 2249   GLUCOSEU Negative 10/15/2013 1124   HGBUR NEGATIVE 09/23/2021 2249   BILIRUBINUR NEGATIVE 09/23/2021 2249   BILIRUBINUR Negative 05/19/2021 1551   BILIRUBINUR Negative 10/15/2013 [place] 09/23/2021 2249  PROTEINUR NEGATIVE 09/23/2021 2249   NITRITE NEGATIVE 09/23/2021 2249   LEUKOCYTESUR LARGE (A) 09/23/2021 2249   LEUKOCYTESUR Negative 10/15/2013 1124   Sepsis Labs: Invalid input(s): PROCALCITONIN, LACTICIDVEN   Time coordinating discharge: 45 minutes  SIGNED:  Mercy Riding, MD  [facility] 09/24/2021, 1:07 PM

## 2021-09-24 NOTE — Progress Notes (Signed)
PT Screen Note  Patient Details Name: [name] MRN: [id number] DOB: [birth year]   Cancelled Treatment:    Reason Eval/Treat Not Completed: PT screened, no needs identified, will sign off Pt able to easily and confidently rise to standing and circumambulate the nurses' station with community appropriate speed w/o AD and no safety concerns.  Pt able to ascend/descend steps reciprocally w/o UE use.  Pt with some minimal fatigue with the 300+ ft of ambulation and stairs but she attributes mild shortness of breath to having to wear a mask.  Pt is near her baseline and does not display any need for further PT intervention, will sign off.  Kreg Shropshire, DPT 09/24/2021, 9:35 AM

## 2021-09-24 NOTE — Evaluation (Signed)
Clinical/Bedside Swallow Evaluation Patient Details  Name: [name] MRN: [id number] Date of Birth: Jun 09, 1956  Today's Date: 09/24/2021 Time: SLP Start Time (ACUTE ONLY): 66 SLP Stop Time (ACUTE ONLY): 0940 SLP Time Calculation (min) (ACUTE ONLY): 25 min  Past Medical History:  Past Medical History:  Diagnosis Date   Allergy    Environmental   Arthritis    Asthma    Carotid artery stenosis    Carotid stenosis    COPD (chronic obstructive pulmonary disease) (HCC)    Coronary artery disease    Coronary artery disease    Hemorrhoids    Hyperlipidemia    Hypertension    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(HCC)    negative cardiac cath   Nicotine addiction    Peripheral vascular disease (HCC)    Pneumonia    Shortness of breath dyspnea    Tobacco abuse    Past Surgical History:  Past Surgical History:  Procedure Laterality Date   ABDOMINAL HYSTERECTOMY     APPENDECTOMY     CARDIAC CATHETERIZATION     CARDIAC SURGERY     CHOLECYSTECTOMY     COLONOSCOPY  12/15/11   [place]->bleeding internal hemorrhoids, otherwise normal   COLONOSCOPY WITH PROPOFOL N/A 04/12/2015   Procedure: COLONOSCOPY WITH PROPOFOL;  Surgeon: Lucilla Lame, MD;  Location: [facility] ENDOSCOPY;  Service: Endoscopy;  Laterality: N/A;   COLONOSCOPY WITH PROPOFOL N/A 01/05/2020   Procedure: COLONOSCOPY WITH PROPOFOL;  Surgeon: Lucilla Lame, MD;  Location: [facility] END[facility]COPY;  Service: Endoscopy;  Laterality: N/A;   ENDARTERECTOMY FEMORAL Left 04/26/2015   Procedure: ENDARTERECTOMY FEMORAL;  Surgeon: Algernon Huxley, MD;  Location: [facility] ORS;  Service: Vascular;  Laterality: Left;   ENDOVASCULAR STENT INSERTION Bilateral    Legs   FLEXIBLE BRONCHOSCOPY N/A 02/02/2015   Procedure: FLEXIBLE BRONCHOSCOPY;  Surgeon: Allyne Gee, MD;  Location: [facility] ORS;  Service: Pulmonary;  Laterality: N/A;   HEMORRHOID SURGERY     PERIPHERAL VASCULAR CATHETERIZATION Left 04/25/2015   Procedure: Lower Extremity Angiography;  Surgeon: Algernon Huxley, MD;  Location:  [place] [facility];  Service: Cardiovascular;  Laterality: Left;   PERIPHERAL VASCULAR CATHETERIZATION Left 04/25/2015   Procedure: Lower Extremity Intervention;  Surgeon: Algernon Huxley, MD;  Location: [place] [facility];  Service: Cardiovascular;  Laterality: Left;   PERIPHERAL VASCULAR CATHETERIZATION Left 01/02/2016   Procedure: Lower Extremity Angiography;  Surgeon: Algernon Huxley, MD;  Location: [place] [facility];  Service: Cardiovascular;  Laterality: Left;   PERIPHERAL VASCULAR CATHETERIZATION  01/02/2016   Procedure: Lower Extremity Intervention;  Surgeon: Algernon Huxley, MD;  Location: [place] [facility];  Service: Cardiovascular;;   THROMBECTOMY FEMORAL ARTERY Left 04/26/2015   Procedure: THROMBECTOMY FEMORAL ARTERY;  Surgeon: Algernon Huxley, MD;  Location: [facility] ORS;  Service: Vascular;  Laterality: Left;   HPI:  Per [id number] H&P "[name] is a 66 y.o. female seen in ed with complaints passing out and waking up on her kitchen floor. Pt not aware of what happened.  Patient was able to get herself up from the floor.  Per report this patient was trying to get up she almost fell again but she was helped and lowered to the floor.  Initial assessment was done by fire brigade and patient's blood pressure was found to be in the 70s and repeat blood pressure showed systolic of 97L patient reports chest wall tenderness but no other symptoms-emergency room upon my interview patient denies any chest pain shortness of breath palpitations or any other complaints  she states that she does not know when she is going to fall and does not recall any symptoms prior.  Patient does drink heavily and does smoke last alcoholic drink was yesterday.        Pt has past medical history of alcohol abuse, tobacco abuse, allergies to aspirin, tramadol, Zyrtec, COPD, pneumonia history, hypertension."    Assessment / Plan / Recommendation  Clinical Impression  Pt seen for clinical swallowing evaluation. Pt alert, pleasant,  and cooperative. Received seated EOB and consuming breakfast. Endorses odynophagia and "neck swelling" since "dental procedure" on Thursday, 1/19. Swelling and subjective reports of tenderness appreciated with palpation. MD (Dr. Cyndia Skeeters) made aware. RN present for majority of evaluation.   Oral motor exam completed and significant for neck swelling/tenderness and multiple missing teeth/poor dental status.   Pt chart review, temp and WBC WNL. CXR 09/23/21 "No active disease.  Emphysema." Pt on room air.  Pt given trials of solid, pureed, and thin liquids (via cup, straw, and unprompted sequential straw sips). Pt presents with s/sx mild oral dysphagia c/b mildly prolonged mastication of solids. x1 immediate cough following sequental sips of thin liquids via straw which is concerning for possible pharyngeal dysphagia. No other instances of overt s/sx suspected pharyngeal dysphagia noted; however, pt with c/o odynophagia across trials.   Recommend diet downgrade to Dysphagia 2 Diet with Thin Liquids and safe swallowing strategies/aspiration precautions as outlined below.   Pt is at increased risk for aspiration/aspiration PNA given dental status, clinical presentation, and multiple medical comorbidities (including COPD, PNA).  SLP to f/u per POC for diet tolerance and consideration for instrumental swallowing evaluation. Should pt d/c prior to SLP f/u, consider outpatient MBSS if pt's complaints and s/sx continue at d/c.   Pt, RN, and MD made aware of results of assessment, diet recommendations, safe swallowing strategies/aspiration precautions, and SLP POC.   SLP Visit Diagnosis: Dysphagia, oropharyngeal phase (R13.12)    Aspiration Risk  Mild aspiration risk    Diet Recommendation Dysphagia 2 (Fine chop);Thin liquid   Medication Administration: Whole meds with liquid (x1 at a time) Supervision: Patient able to self feed Compensations: Slow rate;Small sips/bites Postural Changes: Seated upright  at 90 degrees    Other  Recommendations Oral Care Recommendations: Oral care BID;Patient independent with oral care    Recommendations for follow up therapy are one component of a multi-disciplinary discharge planning process, led by the attending physician.  Recommendations may be updated based on patient status, additional functional criteria and insurance authorization.  Follow up Recommendations  (consider outpatient MBSS if s/sx continue and pt d/c's prior to f/u SLP session)      Assistance Recommended at Discharge  (TBD)  Functional Status Assessment Patient has had a recent decline in their functional status and demonstrates the ability to make significant improvements in function in a reasonable and predictable amount of time.  Frequency and Duration min 2x/week  1 week       Prognosis Prognosis for Safe Diet Advancement: Fair Barriers to Reach Goals: Severity of deficits      Swallow Study   General Date of Onset: 09/23/21 HPI: Per Physician's H&P "[name] is a 66 y.o. female seen in ed with complaints passing out and waking up on her kitchen floor. Pt not aware of what happened.  Patient was able to get herself up from the floor.  Per report this patient was trying to get up she almost fell again but she was helped and lowered to the floor.  Initial assessment was done by fire brigade and patient's blood pressure was found to be in the 70s and repeat blood pressure showed systolic of 74J patient reports chest wall tenderness but no other symptoms-emergency room upon my interview patient denies any chest pain shortness of breath palpitations or any other complaints she states that she does not know when she is going to fall and does not recall any symptoms prior.  Patient does drink heavily and does smoke last alcoholic drink was yesterday.        Pt has past medical history of alcohol abuse, tobacco abuse, allergies to aspirin, tramadol, Zyrtec, COPD, pneumonia history,  hypertension." Type of Study: Bedside Swallow Evaluation Previous Swallow Assessment: unkown Diet Prior to this Study: Regular Temperature Spikes Noted: No Respiratory Status: Room air History of Recent Intubation: No Behavior/Cognition: Alert;Cooperative;Pleasant mood Oral Cavity Assessment: Within Functional Limits Oral Care Completed by SLP: Yes Oral Cavity - Dentition: Missing dentition;Poor condition Vision: Functional for self-feeding Self-Feeding Abilities: Able to feed self Patient Positioning: Upright in bed (seated EOB) Baseline Vocal Quality: Normal Volitional Cough: Strong Volitional Swallow: Able to elicit    Thin Liquid Thin Liquid: Impaired Presentation: Cup;Straw Pharyngeal  Phase Impairments: Cough - Immediate Other Comments: x1 immediate cough following sequental sips of thin liquids via straw; c/o odynophagia; no other instances of overt s/sx suspected pharyngeal dysphagia noted    Puree Puree: Within functional limits Presentation: Self Fed;Spoon Other Comments: ~2 oz; c/o odynophagia   Solid     Solid: Impaired Presentation: Self Fed Oral Phase Impairments: Impaired mastication Oral Phase Functional Implications: Prolonged oral transit;Impaired mastication Other Comments: c/o odynophagia     Cherrie Gauze, M.S., [facility] [phone] (ASCOM)  [name] 09/24/2021,11:30 AM

## 2021-09-24 NOTE — Plan of Care (Signed)
  Problem: Education: Goal: Knowledge of condition and prescribed therapy will improve Outcome: Progressing   Problem: Cardiac: Goal: Will achieve and/or maintain adequate cardiac output Outcome: Progressing   Problem: Physical Regulation: Goal: Complications related to the disease process, condition or treatment will be avoided or minimized Outcome: Progressing   

## 2021-09-25 ENCOUNTER — Other Ambulatory Visit: Payer: Self-pay | Admitting: Nurse Practitioner

## 2021-09-25 DIAGNOSIS — E782 Mixed hyperlipidemia: Secondary | ICD-10-CM

## 2021-09-28 ENCOUNTER — Telehealth: Payer: Self-pay | Admitting: Student-PharmD

## 2021-09-28 NOTE — Progress Notes (Addendum)
Hypertension (HTN) Review Call   [name],[name]  [id number] 29 years, Female  DOB: 07/08/1956  M: [phone]  Hypertension Review (HC) Completed by Charlann Lange on 09/28/2021  Chart Review Is the patient enrolled in RPM with BP Monitor?: No  BP #1 reading (last): 96/83 on: 09/23/2021  BP #2 reading: 174/98 on: 09/18/2021  BP #3 reading: 132/75 on: 08/16/2021  Any of the last 3 BP > 140/90 mmHg?: Yes  What recent interventions have been made by any provider to improve the patient's conditions in the last 3 months?:  Office Visit: 08/16/21 Jonetta Osgood, NP For Hospital follow-up. No medication changes. 09/18/21 Jonetta Osgood, NP. For hypertension. CHANGED Losartan to 100 mg daily.  Any recent hospitalizations or ED visits since last visit with CPP?: Yes  Brief Summary (including Medication and/or Diagnosis changes):: 08/05/21 [place] ( 5 days) Jennye Boroughs, MD For Severe sepsis PER NOTE: CHANGED Isosorbide  to 60 mg 1 tablet daily. 09/23/21 [place] ( 18 hours) Mercy Riding, MD. For Syncope and collapse. STOPPED Amoxicillin and Benzonatate.  Adherence Review Adherence rates for STAR metric medications: Atorvastatin 80 mg 08/15/2021 90 DS Losartan 50 mg 09/24/21 90 DS  Adherence rates for medications indicated for disease state being reviewed: Losartan 50 mg 09/24/21 90 DS  Does the patient have >5 day gap between last estimated fill dates for any of the above medications?: No  Disease State Questions Able to connect with the Patient?: Yes  Is the patient monitoring his/her BP?: Yes  How often are you checking your BP?: occasionally  Home BP Reading #1 (most recent): Patient stated unknown she doesn't write it down.  Is the patient having any low BP Readings <90/60?: No  Is the patient having any BP readings above >180/100?: No  Is the patient's average BP>140/90?: No  What is your blood pressure  goal?: 120/80  Educate patient to inform proper points on checking BP at home:: Sit with feet flat on the floor, arm at heart level., Do not drink caffeine or smoke a cigarette at least 30 min. prior to checking.  What diet changes have you made to improve your Blood Pressure Control?: limiting processed foods, eating more home-cooked meals  What exercise are you doing to improve your Blood Pressure Control?: walking  Charlann Lange, [place]  [phone]  Pharmacist Review  Adherence gaps identified?: No Drug Therapy Problems identified?: No Assessment: Controlled  Alena Bills Clinical Pharmacist [phone]

## 2021-09-29 ENCOUNTER — Ambulatory Visit (INDEPENDENT_AMBULATORY_CARE_PROVIDER_SITE_OTHER): Payer: Medicare Other | Admitting: Nurse Practitioner

## 2021-09-29 ENCOUNTER — Other Ambulatory Visit: Payer: Self-pay

## 2021-09-29 ENCOUNTER — Encounter: Payer: Self-pay | Admitting: Nurse Practitioner

## 2021-09-29 VITALS — BP 140/70 | HR 78 | Temp 98.1°F | Resp 16 | Ht 64.0 in | Wt 114.8 lb

## 2021-09-29 DIAGNOSIS — J449 Chronic obstructive pulmonary disease, unspecified: Secondary | ICD-10-CM | POA: Diagnosis not present

## 2021-09-29 DIAGNOSIS — E782 Mixed hyperlipidemia: Secondary | ICD-10-CM

## 2021-09-29 DIAGNOSIS — I10 Essential (primary) hypertension: Secondary | ICD-10-CM | POA: Diagnosis not present

## 2021-09-29 DIAGNOSIS — F17209 Nicotine dependence, unspecified, with unspecified nicotine-induced disorders: Secondary | ICD-10-CM

## 2021-09-29 DIAGNOSIS — F10280 Alcohol dependence with alcohol-induced anxiety disorder: Secondary | ICD-10-CM

## 2021-09-29 MED ORDER — ATORVASTATIN CALCIUM 80 MG PO TABS: 80.0000 mg | ORAL_TABLET | Freq: Every day | ORAL | 1 refills | Status: DC

## 2021-09-29 NOTE — Progress Notes (Signed)
[facility] [place] [id number] [facility] Discharge Acute Issues Care Follow Up                                                                        Patient Demographics  [name], is a 66 y.o. female  DOB [birth year]  MRN [id number].  Primary MD  Jonetta Osgood, NP   Reason for TCC follow Up - syncope and collapse   Past Medical History:  Diagnosis Date   Allergy    Environmental   Arthritis    Asthma    Carotid artery stenosis    Carotid stenosis    COPD (chronic obstructive pulmonary disease) (HCC)    Coronary artery disease    Coronary artery disease    Hemorrhoids    Hyperlipidemia    Hypertension    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(HCC)    negative cardiac cath   Nicotine addiction    Peripheral vascular disease (HCC)    Pneumonia    Shortness of breath dyspnea    Tobacco abuse     Past Surgical History:  Procedure Laterality Date   ABDOMINAL HYSTERECTOMY     APPENDECTOMY     CARDIAC CATHETERIZATION     CARDIAC SURGERY     CHOLECYSTECTOMY     COLONOSCOPY  12/15/11   OH->bleeding internal hemorrhoids, otherwise normal   COLONOSCOPY WITH PROPOFOL N/A 04/12/2015   Procedure: COLONOSCOPY WITH PROPOFOL;  Surgeon: Lucilla Lame, MD;  Location: [facility] ENDOSCOPY;  Service: Endoscopy;  Laterality: N/A;   COLONOSCOPY WITH PROPOFOL N/A 01/05/2020   Procedure: COLONOSCOPY WITH PROPOFOL;  Surgeon: Lucilla Lame, MD;  Location: [facility] ENDOSCOPY;  Service: Endoscopy;  Laterality: N/A;   ENDARTERECTOMY FEMORAL Left 04/26/2015   Procedure: ENDARTERECTOMY FEMORAL;  Surgeon: Algernon Huxley, MD;  Location: [facility] ORS;  Service: Vascular;  Laterality: Left;   ENDOVASCULAR STENT INSERTION Bilateral    Legs   FLEXIBLE BRONCHOSCOPY N/A 02/02/2015   Procedure: FLEXIBLE BRONCHOSCOPY;  Surgeon: Allyne Gee, MD;  Location: [facility] ORS;  Service: Pulmonary;  Laterality:  N/A;   HEMORRHOID SURGERY     PERIPHERAL VASCULAR CATHETERIZATION Left 04/25/2015   Procedure: Lower Extremity Angiography;  Surgeon: Algernon Huxley, MD;  Location: [place] CV LAB;  Service: Cardiovascular;  Laterality: Left;   PERIPHERAL VASCULAR CATHETERIZATION Left 04/25/2015   Procedure: Lower Extremity Intervention;  Surgeon: Algernon Huxley, MD;  Location: [place] [facility];  Service: Cardiovascular;  Laterality: Left;   PERIPHERAL VASCULAR CATHETERIZATION Left 01/02/2016   Procedure: Lower Extremity Angiography;  Surgeon: Algernon Huxley, MD;  Location: [place] [facility];  Service: Cardiovascular;  Laterality: Left;   PERIPHERAL VASCULAR CATHETERIZATION  01/02/2016   Procedure: Lower Extremity Intervention;  Surgeon: Algernon Huxley, MD;  Location: [place] [facility] LAB;  Service: Cardiovascular;;   THROMBECTOMY FEMORAL ARTERY Left 04/26/2015   Procedure: THROMBECTOMY FEMORAL ARTERY;  Surgeon: Algernon Huxley, MD;  Location: [facility] ORS;  Service: Vascular;  Laterality: Left;  Recent HPI and Hospital Course  67 year old F with PMH of EtOH use disorder, tobacco use disorder, COPD, pneumonia, hypertension, dental caries status post recent dental extraction presenting with syncopal episode.  Reportedly hypotensive to 70s when EMS arrived.  SBP improved to 90s in ED and improved further with IV hydration.  CT head without acute finding.  Cr 1.42 (baseline 0.7).  K3.2.  Lactic acid 3.7 but improved to 2.4 after hydration.  EtOH level elevated to 82.  Serial troponin negative.  EKG without acute ischemic finding.  CBC with macrocytic anemia.    The next day, orthostatic vitals negative.  AKI and hypokalemia resolved.  Ambulated with therapy without problem.  She was counseled on alcohol and tobacco cessation, and discharged home for outpatient follow-up with PCP as needed.  TTE results pending.  Syncopal episode likely due to dehydration in the setting of alcohol intoxication: Patient admits to drinking  unknown quantity of beer and whiskey the day prior.  She was hypotensive on arrival.  Alcohol level elevated to 82.  No withdrawal symptoms. -Counseled on alcohol cessation -Multivitamin, folic acid and thiamine -Provided with resources. -TTE results pending.   Lactic acidosis: Likely from EtOH and dehydration.   AKI: Resolved.   Hypokalemia: Resolved.     Chronic COPD: Stable. -Continue home breathing treatment   Tobacco use disorder: Reports smoking about a pack a day. -Encouraged cessation.   Essential hypertension: BP elevated likely from IV fluid. -Continue home meds.   Dental caries/recent dental extractions-some tenderness and fullness under her chin bilaterally.  Doubt infectious process without fever or leukocytosis. -Chlorhexidine mouth rinse -Outpatient follow-up with her dentist.   Macrocytic anemia: Likely from alcohol. -L45 and folic acid   Moderate malnutrition: Likely due to alcohol. Body mass index is 19.47 kg/m.  [place] Office Visit from 09/29/2021 in [facility]  Alcohol Use Disorder Identification Test Final Score (AUDIT) [facility] Acute Care Issue to be followed in the Clinic  Alcohol use disorder Dehydration Chronic COPD Tobacco use disorder Hypertension    Subjective:   [name] today has, No headache, No chest pain, No abdominal pain - No Nausea, No new weakness tingling or numbness, No Cough - SOB. Denies any dizziness, lightheadedness or syncope or near-syncope.   Assessment & Plan   1. Essential hypertension Blood pressure is stable with current medications. Continue as prescribed.   2. Mixed hyperlipidemia Stable, refills ordered - atorvastatin (LIPITOR) 80 MG tablet; Take 1 tablet (80 mg total) by mouth daily.  Dispense: 90 tablet; Refill: 1  3. Chronic obstructive pulmonary disease, unspecified COPD type ([place]) Stable since having pneumonia in December.   4. Alcohol dependence with  alcohol-induced anxiety disorder ([place]) Discussed alcohol use at length with patient. AUDIT score is 14. She was hospitalized for syncope and collapse after having too many beers and shots of liquor. Patient reports she was upset and drank too much. She reports that this does not happen that often but she does have 3-4 shots of bourbon every night. Discuss the need to cut back on her drinking and being safe. Patient acknowledged the need to cut back on her drinking and reports that another provider has told her this in the past. She is coming back on 10/17/21 for a previously scheduled follow up.   5. Tobacco use disorder, continuous Continues to smoke cigarettes and is not interested in quitting.     Reason for frequent admissions/ER visits  alcohol intoxication Dehydration COPD Pneumonia    Objective:   Vitals:   09/29/21 0926  BP: 140/70  Pulse: 78  Resp: 16  Temp: 98.1 F (36.7 C)  SpO2: 96%  Weight: 114 lb 12.8 oz (52.1 kg)  Height: 5\' 4"  (1.626 m)    Wt Readings from Last 3 Encounters:  09/29/21 114 lb 12.8 oz (52.1 kg)  09/24/21 113 lb 6.4 oz (51.4 kg)  09/18/21 115 lb (52.2 kg)    Allergies as of 09/29/2021       Reactions   Aspirin Nausea And Vomiting, Other (See Comments)   Pt states aspirin makes her cramp and have to use coated kind.   Tramadol    Zyrtec [cetirizine]         Medication List        Accurate as of September 29, 2021 10:47 AM. If you have any questions, ask your nurse or doctor.          albuterol 108 (90 Base) MCG/ACT inhaler Commonly known as: VENTOLIN HFA Inhale 2 puffs into the lungs every 6 (six) hours as needed for wheezing or shortness of breath.   aspirin 81 MG EC tablet Take 1 tablet (81 mg total) by mouth daily.   atorvastatin 80 MG tablet Commonly known as: LIPITOR Take 1 tablet (80 mg total) by mouth daily.   budesonide-formoterol 80-4.5 MCG/ACT inhaler Commonly known as: SYMBICORT Inhale 2 puffs into the lungs  daily.   chlorhexidine 0.12 % solution Commonly known as: PERIDEX Use as directed 15 mLs in the mouth or throat 2 (two) times daily.   cholecalciferol 10 MCG (400 UNIT) Tabs tablet Commonly known as: VITAMIN D3 Take 400 Units by mouth.   ezetimibe 10 MG tablet Commonly known as: ZETIA Take 1 tablet (10 mg total) by mouth daily.   ferrous sulfate 325 (65 FE) MG tablet Take 325 mg by mouth daily.   folic acid 1 MG tablet Commonly known as: FOLVITE Take 1 tablet (1 mg total) by mouth daily.   isosorbide mononitrate 60 MG 24 hr tablet Commonly known as: IMDUR Take 1 tablet (60 mg total) by mouth daily.   losartan 50 MG tablet Commonly known as: COZAAR Take 2 tablets (100 mg total) by mouth daily.   multivitamin with minerals Tabs tablet Take 1 tablet by mouth daily.   Repatha SureClick 856 MG/ML Soaj Generic drug: Evolocumab Inject 140 mg into the skin every 14 (fourteen) days.   thiamine 100 MG tablet Take 1 tablet (100 mg total) by mouth daily.         Physical Exam: Constitutional: Patient appears well-developed and well-nourished. Not in obvious distress. HENT: Normocephalic, atraumatic, External right and left ear normal. Oropharynx is clear and moist.  Eyes: Conjunctivae and EOM are normal. PERRLA, no scleral icterus. Neck: Normal ROM. Neck supple. No JVD. No tracheal deviation. No thyromegaly. CVS: RRR, S1/S2 +, no murmurs, no gallops, no carotid bruit.  Pulmonary: Effort and breath sounds normal, no stridor, rhonchi, wheezes, rales.  Abdominal: Soft. BS +, no distension, tenderness, rebound or guarding.  Musculoskeletal: Normal range of motion. No edema and no tenderness.  Lymphadenopathy: No lymphadenopathy noted, cervical, inguinal or axillary Neuro: Alert. Normal reflexes, muscle tone coordination. No cranial nerve deficit. Skin: Skin is warm and dry. No rash noted. Not diaphoretic. No erythema. No pallor. Psychiatric: Normal mood and affect. Behavior,  judgment, thought content normal.   Data Review   Micro Results Recent Results (from the past 240 hour(s))  Resp Panel by RT-PCR (Flu A&B, Covid) Nasopharyngeal Swab     Status: None   Collection Time: 09/23/21 10:49 PM   Specimen: Nasopharyngeal Swab; Nasopharyngeal(NP) swabs in vial transport medium  Result Value Ref Range Status   SARS Coronavirus 2 by RT PCR NEGATIVE NEGATIVE Final    Comment: (NOTE) SARS-CoV-2 target nucleic acids are NOT DETECTED.  The SARS-CoV-2 RNA is generally detectable in upper respiratory specimens during the acute phase of infection. The lowest concentration of SARS-CoV-2 viral copies this assay can detect is 138 copies/mL. A negative result does not preclude SARS-Cov-2 infection and should not be used as the sole basis for treatment or other patient management decisions. A negative result may occur with  improper specimen collection/handling, submission of specimen other than nasopharyngeal swab, presence of viral mutation(s) within the areas targeted by this assay, and inadequate number of viral copies(<138 copies/mL). A negative result must be combined with clinical observations, patient history, and epidemiological information. The expected result is Negative.  Fact Sheet for Patients:  EntrepreneurPulse.com.au  Fact Sheet for Healthcare Providers:  IncredibleEmployment.be  This test is no t yet approved or cleared by the [place] FDA and  has been authorized for detection and/or diagnosis of SARS-CoV-2 by FDA under an Emergency Use Authorization (EUA). This EUA will remain  in effect (meaning this test can be used) for the duration of the COVID-19 declaration under Section 564(b)(1) of the Act, 21 U.S.C.section 360bbb-3(b)(1), unless the authorization is terminated  or revoked sooner.       Influenza A by PCR NEGATIVE NEGATIVE Final   Influenza B by PCR NEGATIVE NEGATIVE Final    Comment:  (NOTE) The Xpert Xpress SARS-CoV-2/FLU/RSV plus assay is intended as an aid in the diagnosis of influenza from Nasopharyngeal swab specimens and should not be used as a sole basis for treatment. Nasal washings and aspirates are unacceptable for Xpert Xpress SARS-CoV-2/FLU/RSV testing.  Fact Sheet for Patients: EntrepreneurPulse.com.au  Fact Sheet for Healthcare Providers: IncredibleEmployment.be  This test is not yet approved or cleared by the [place] FDA and has been authorized for detection and/or diagnosis of SARS-CoV-2 by FDA under an Emergency Use Authorization (EUA). This EUA will remain in effect (meaning this test can be used) for the duration of the COVID-19 declaration under Section 564(b)(1) of the Act, 21 U.S.C. section 360bbb-3(b)(1), unless the authorization is terminated or revoked.  Performed at [facility], [address] [place], [place] [address]      CBC Recent Labs  Lab 09/23/21 1902 09/24/21 0019 09/24/21 0638  WBC 5.4 4.5 4.1  HGB 11.9* 10.5* 11.2*  HCT 37.1 31.9* 34.2*  PLT 142* 121* 138*  MCV 103.6* 102.2* 99.4  MCH 33.2 33.7 32.6  MCHC 32.1 32.9 32.7  RDW 15.6* 15.6* 15.5  LYMPHSABS 1.3  --   --   MONOABS 0.4  --   --   EOSABS 0.0  --   --   BASOSABS 0.0  --   --     Chemistries  Recent Labs  Lab 09/23/21 1902 09/24/21 0019 09/24/21 0638  NA 138  --  139  K 3.2*  --  4.1  CL 105  --  109  CO2 19*  --  21*  GLUCOSE 78  --  88  BUN 16  --  14  CREATININE 1.42* 1.06* 0.86  CALCIUM 8.6*  --  8.6*  AST 21  --   --   ALT 16  --   --  ALKPHOS 54  --   --   BILITOT 0.2*  --   --    ------------------------------------------------------------------------------------------------------------------ estimated creatinine clearance is 53.6 mL/min (by C-G formula based on SCr of 0.86  mg/dL). ------------------------------------------------------------------------------------------------------------------ No results for input(s): HGBA1C in the last 72 hours. ------------------------------------------------------------------------------------------------------------------ No results for input(s): CHOL, HDL, LDLCALC, TRIG, CHOLHDL, LDLDIRECT in the last 72 hours. ------------------------------------------------------------------------------------------------------------------ No results for input(s): TSH, T4TOTAL, T3FREE, THYROIDAB in the last 72 hours.  Invalid input(s): FREET3 ------------------------------------------------------------------------------------------------------------------ No results for input(s): VITAMINB12, FOLATE, FERRITIN, TIBC, IRON, RETICCTPCT in the last 72 hours.  Coagulation profile No results for input(s): INR, PROTIME in the last 168 hours.  No results for input(s): DDIMER in the last 72 hours.  Cardiac Enzymes No results for input(s): CKMB, TROPONINI, MYOGLOBIN in the last 168 hours.  Invalid input(s): CK ------------------------------------------------------------------------------------------------------------------ Invalid input(s): POCBNP  Return in 18 days (on 10/17/2021) for previously scheduled, F/U.  Time Spent in minutes  45 Time spent with patient included reviewing progress notes, labs, imaging studies, and discussing plan for follow up.   This patient was seen by Jonetta Osgood, FNP-C in collaboration with Dr. Clayborn Bigness as a part of collaborative care agreement.   Jonetta Osgood MSN, FNP-C on 09/29/2021 at 10:47 AM   **Disclaimer: This note may have been dictated with voice recognition software. Similar sounding words can inadvertently be transcribed and this note may contain transcription errors which may not have been corrected upon publication of note.**

## 2021-10-01 DIAGNOSIS — I10 Essential (primary) hypertension: Secondary | ICD-10-CM | POA: Diagnosis not present

## 2021-10-01 DIAGNOSIS — J441 Chronic obstructive pulmonary disease with (acute) exacerbation: Secondary | ICD-10-CM | POA: Diagnosis not present

## 2021-10-17 ENCOUNTER — Ambulatory Visit (INDEPENDENT_AMBULATORY_CARE_PROVIDER_SITE_OTHER): Payer: Medicare Other | Admitting: Nurse Practitioner

## 2021-10-17 ENCOUNTER — Other Ambulatory Visit: Payer: Self-pay

## 2021-10-17 ENCOUNTER — Encounter: Payer: Self-pay | Admitting: Nurse Practitioner

## 2021-10-17 VITALS — BP 140/60 | HR 97 | Temp 98.4°F | Resp 16 | Ht 64.0 in | Wt 115.0 lb

## 2021-10-17 DIAGNOSIS — F17209 Nicotine dependence, unspecified, with unspecified nicotine-induced disorders: Secondary | ICD-10-CM | POA: Diagnosis not present

## 2021-10-17 DIAGNOSIS — I10 Essential (primary) hypertension: Secondary | ICD-10-CM

## 2021-10-17 DIAGNOSIS — J449 Chronic obstructive pulmonary disease, unspecified: Secondary | ICD-10-CM | POA: Diagnosis not present

## 2021-10-17 DIAGNOSIS — F10280 Alcohol dependence with alcohol-induced anxiety disorder: Secondary | ICD-10-CM | POA: Diagnosis not present

## 2021-10-17 NOTE — Progress Notes (Unsigned)
[name] [place], [place] [address]  Internal MEDICINE  Office Visit Note  Patient Name: [name]  [id number]  [id number]  Date of Service: 10/17/2021  Chief Complaint  Patient presents with   Follow-up   Hyperlipidemia   Hypertension   Asthma   COPD    HPI [name] presents for a follow-up visit for  140/60    Current Medication: Outpatient Encounter Medications as of 10/17/2021  Medication Sig   albuterol (VENTOLIN HFA) 108 (90 Base) MCG/ACT inhaler Inhale 2 puffs into the lungs every 6 (six) hours as needed for wheezing or shortness of breath.   aspirin EC 81 MG EC tablet Take 1 tablet (81 mg total) by mouth daily.   atorvastatin (LIPITOR) 80 MG tablet Take 1 tablet (80 mg total) by mouth daily.   budesonide-formoterol (SYMBICORT) 80-4.5 MCG/ACT inhaler Inhale 2 puffs into the lungs daily.   chlorhexidine (PERIDEX) 0.12 % solution Use as directed 15 mLs in the mouth or throat 2 (two) times daily.   cholecalciferol (VITAMIN D) 400 units TABS tablet Take 400 Units by mouth.   ezetimibe (ZETIA) 10 MG tablet Take 1 tablet (10 mg total) by mouth daily.   ferrous sulfate 325 (65 FE) MG tablet Take 325 mg by mouth daily.   folic acid (FOLVITE) 1 MG tablet Take 1 tablet (1 mg total) by mouth daily.   isosorbide mononitrate (IMDUR) 60 MG 24 hr tablet Take 1 tablet (60 mg total) by mouth daily.   losartan (COZAAR) 50 MG tablet Take 2 tablets (100 mg total) by mouth daily.   Multiple Vitamin (MULTIVITAMIN WITH MINERALS) TABS tablet Take 1 tablet by mouth daily.   REPATHA SURECLICK 557 MG/ML SOAJ Inject 140 mg into the skin every 14 (fourteen) days.   thiamine 100 MG tablet Take 1 tablet (100 mg total) by mouth daily.   No facility-administered encounter medications on file as of 10/17/2021.    Surgical History: Past Surgical History:  Procedure Laterality Date   ABDOMINAL HYSTERECTOMY     APPENDECTOMY     CARDIAC CATHETERIZATION     CARDIAC  SURGERY     CHOLECYSTECTOMY     COLONOSCOPY  12/15/11   OH->bleeding internal hemorrhoids, otherwise normal   COLONOSCOPY WITH PROPOFOL N/A 04/12/2015   Procedure: COLONOSCOPY WITH PROPOFOL;  Surgeon: Lucilla Lame, MD;  Location: [facility] ENDOSCOPY;  Service: Endoscopy;  Laterality: N/A;   COLONOSCOPY WITH PROPOFOL N/A 01/05/2020   Procedure: COLONOSCOPY WITH PROPOFOL;  Surgeon: Lucilla Lame, MD;  Location: [facility] ENDOSCOPY;  Service: Endoscopy;  Laterality: N/A;   ENDARTERECTOMY FEMORAL Left 04/26/2015   Procedure: ENDARTERECTOMY FEMORAL;  Surgeon: Algernon Huxley, MD;  Location: [facility] ORS;  Service: Vascular;  Laterality: Left;   ENDOVASCULAR STENT INSERTION Bilateral    Legs   FLEXIBLE BRONCHOSCOPY N/A 02/02/2015   Procedure: FLEXIBLE BRONCHOSCOPY;  Surgeon: Allyne Gee, MD;  Location: [facility] ORS;  Service: Pulmonary;  Laterality: N/A;   HEMORRHOID SURGERY     PERIPHERAL VASCULAR CATHETERIZATION Left 04/25/2015   Procedure: Lower Extremity Angiography;  Surgeon: Algernon Huxley, MD;  Location: [place] [facility] LAB;  Service: Cardiovascular;  Laterality: Left;   PERIPHERAL VASCULAR CATHETERIZATION Left 04/25/2015   Procedure: Lower Extremity Intervention;  Surgeon: Algernon Huxley, MD;  Location: [place] [facility] LAB;  Service: Cardiovascular;  Laterality: Left;   PERIPHERAL VASCULAR CATHETERIZATION Left 01/02/2016   Procedure: Lower Extremity Angiography;  Surgeon: Algernon Huxley, MD;  Location: [place] [facility] LAB;  Service: Cardiovascular;  Laterality: Left;   PERIPHERAL VASCULAR CATHETERIZATION  01/02/2016   Procedure: Lower Extremity Intervention;  Surgeon: Algernon Huxley, MD;  Location: [place] [facility] LAB;  Service: Cardiovascular;;   THROMBECTOMY FEMORAL ARTERY Left 04/26/2015   Procedure: THROMBECTOMY FEMORAL ARTERY;  Surgeon: Algernon Huxley, MD;  Location: [facility] ORS;  Service: Vascular;  Laterality: Left;    Medical History: Past Medical History:  Diagnosis Date   Allergy    Environmental   Arthritis    Asthma     Carotid artery stenosis    Carotid stenosis    COPD (chronic obstructive pulmonary disease) (HCC)    Coronary artery disease    Coronary artery disease    Hemorrhoids    Hyperlipidemia    Hypertension    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(HCC)    negative cardiac cath   Nicotine addiction    Peripheral vascular disease (HCC)    Pneumonia    Shortness of breath dyspnea    Tobacco abuse     Family History: Family History  Problem Relation Age of Onset   Hypertension Mother    Hypertension Father     Social History   Socioeconomic History   Marital status: Single    Spouse name: Not on file   Number of children: Not on file   Years of education: Not on file   Highest education level: Not on file  Occupational History   Not on file  Tobacco Use   Smoking status: Every Day    Packs/day: 1.00    Years: 41.00    Pack years: 41.00    Types: Cigarettes   Smokeless tobacco: Never  Vaping Use   Vaping Use: Never used  Substance and Sexual Activity   Alcohol use: Yes    Alcohol/week: 0.0 standard drinks    Comment: weekends, couple beers, pint of liquer only on weekends.  Last drank approx 1 month ago.   Drug use: No   Sexual activity: Not Currently  Other Topics Concern   Not on file  Social History Narrative   Lives with family    Social Determinants of Reilly   Financial Resource Strain: Low Risk    Difficulty of Paying Living Expenses: Not very hard  Food Insecurity: Not on file  Transportation Needs: Not on file  Physical Activity: Not on file  Stress: Not on file  Social Connections: Not on file  Intimate Partner Violence: Not on file      Review of Systems  Vital Signs: BP (!) 142/80    Pulse 97    Temp 98.4 F (36.9 C)    Resp 16    Ht 5\' 4"  (1.626 m)    Wt 115 lb (52.2 kg)    LMP  (LMP Unknown)    SpO2 96%    BMI 19.74 kg/m    Physical Exam     Assessment/Plan:   General Counseling: [name] verbalizes understanding of the findings of todays visit and  agrees with plan of treatment. I have discussed any further diagnostic evaluation that may be needed or ordered today. We also reviewed her medications today. she has been encouraged to call the office with any questions or concerns that should arise related to todays visit.    No orders of the defined types were placed in this encounter.   No orders of the defined types were placed in this encounter.   Return in about 3 months (around 01/14/2022) for F/U, Chaniya Genter PCP.   Total time spent:*** Minutes Time  spent includes review of chart, medications, test results, and follow up plan with the patient.   [place] Controlled Substance Database was reviewed by me.  This patient was seen by Jonetta Osgood, FNP-C in collaboration with Dr. Clayborn Bigness as a part of collaborative care agreement.   Dakarai Mcglocklin R. Valetta Fuller, MSN, FNP-C Internal medicine

## 2021-10-19 ENCOUNTER — Encounter: Payer: Self-pay | Admitting: Nurse Practitioner

## 2021-11-13 ENCOUNTER — Telehealth: Payer: Self-pay | Admitting: Student-PharmD

## 2021-11-13 NOTE — Progress Notes (Addendum)
General Review Call  ? ?[name], [name] [id number] ?79 years, Female  DOB: [birth year]  M: [phone] ? ?General Review (HC) ?Completed by Charlann Lange on 11/13/2021 ? ?Chart Review ?What recent interventions/DTPs have been made by any provider to improve the patient's conditions in the last 3 months?:  ? ?Office Visit: ?09/29/21 Jonetta Osgood, NP. For Essential hypertension. No medication changes. ?10/17/21 Jonetta Osgood, NP. For follow-up. No medication changes. ? ?Any recent hospitalizations or ED visits since last visit with CPP?: No ? ?Adherence Review ?Adherence rates for STAR metric medications: Atorvastatin 80 mg 08/15/21 90 DS ?Losartan 50 mg 09/24/21 90 DS ? ?Adherence rates for medications indicated for disease state being reviewed: Atorvastatin 80 mg 08/15/21 90 DS ?Losartan 50 mg 09/24/21 90 DS ? ?Does the patient have >5 day gap between last estimated fill dates for any of the above medications?: No ? ?Disease State Questions ?Able to connect with the Patient?: Yes ? ?Did patient have any problems with their health recently?: No ? ?Did patient have any problems with their pharmacy?: No ? ?Does patient have any issues or side effects with their medications?: No ? ?Additional  information to pass to Patient's CPP?: No ? ?Anything we can do to help take better care of Patient?: No ? ?Engagement Notes ?Charlann Lange on 11/13/2021 01:45 PM ?[facility] Chart Review: 10 min 11/13/21 ?HC Assessment call time spent: 5 min 11/13/21 ?CP Review: 5 min 11/13/21 ? ?Alena Bills ?Clinical Pharmacist ? ?

## 2021-11-19 ENCOUNTER — Encounter: Payer: Self-pay | Admitting: Nurse Practitioner

## 2021-12-06 ENCOUNTER — Telehealth: Payer: Medicare Other

## 2022-01-11 ENCOUNTER — Other Ambulatory Visit: Payer: Self-pay | Admitting: Nurse Practitioner

## 2022-01-11 DIAGNOSIS — I10 Essential (primary) hypertension: Secondary | ICD-10-CM

## 2022-01-16 ENCOUNTER — Ambulatory Visit: Payer: Medicare Other | Admitting: Nurse Practitioner

## 2022-01-24 ENCOUNTER — Other Ambulatory Visit: Payer: Self-pay | Admitting: Nurse Practitioner

## 2022-01-24 DIAGNOSIS — I10 Essential (primary) hypertension: Secondary | ICD-10-CM

## 2022-04-08 ENCOUNTER — Encounter: Payer: Self-pay | Admitting: Emergency Medicine

## 2022-04-08 ENCOUNTER — Other Ambulatory Visit: Payer: Self-pay

## 2022-04-08 ENCOUNTER — Emergency Department
Admission: EM | Admit: 2022-04-08 | Discharge: 2022-04-08 | Disposition: A | Discharge status: Home / Self Care | Payer: Medicare Other | Attending: Emergency Medicine | Admitting: Emergency Medicine

## 2022-04-08 DIAGNOSIS — Z20822 Contact with and (suspected) exposure to covid-19: Secondary | ICD-10-CM | POA: Diagnosis not present

## 2022-04-08 DIAGNOSIS — J449 Chronic obstructive pulmonary disease, unspecified: Secondary | ICD-10-CM | POA: Diagnosis not present

## 2022-04-08 DIAGNOSIS — Z743 Need for continuous supervision: Secondary | ICD-10-CM | POA: Diagnosis not present

## 2022-04-08 DIAGNOSIS — R519 Headache, unspecified: Secondary | ICD-10-CM | POA: Diagnosis present

## 2022-04-08 DIAGNOSIS — R6889 Other general symptoms and signs: Secondary | ICD-10-CM | POA: Diagnosis not present

## 2022-04-08 DIAGNOSIS — I499 Cardiac arrhythmia, unspecified: Secondary | ICD-10-CM | POA: Diagnosis not present

## 2022-04-08 DIAGNOSIS — I10 Essential (primary) hypertension: Secondary | ICD-10-CM | POA: Diagnosis not present

## 2022-04-08 DIAGNOSIS — G43809 Other migraine, not intractable, without status migrainosus: Secondary | ICD-10-CM | POA: Diagnosis not present

## 2022-04-08 DIAGNOSIS — G43909 Migraine, unspecified, not intractable, without status migrainosus: Secondary | ICD-10-CM | POA: Diagnosis not present

## 2022-04-08 LAB — RESP PANEL BY RT-PCR (FLU A&B, COVID) ARPGX2
Influenza A by PCR: NEGATIVE
Influenza B by PCR: NEGATIVE
SARS Coronavirus 2 by RT PCR: NEGATIVE

## 2022-04-08 MED ORDER — METOCLOPRAMIDE HCL 10 MG PO TABS: 10.0000 mg | ORAL_TABLET | Freq: Three times a day (TID) | ORAL | 0 refills | Status: DC

## 2022-04-08 MED ORDER — METOCLOPRAMIDE HCL 10 MG PO TABS
10.0000 mg | ORAL_TABLET | Freq: Once | ORAL | Status: AC
Start: 2022-04-08 — End: 2022-04-08
  Administered 2022-04-08: 10 mg via ORAL
  Filled 2022-04-08: qty 1

## 2022-04-08 MED ORDER — KETOROLAC TROMETHAMINE 30 MG/ML IJ SOLN
30.0000 mg | Freq: Once | INTRAMUSCULAR | Status: AC
Start: 2022-04-08 — End: 2022-04-08
  Administered 2022-04-08: 30 mg via INTRAMUSCULAR
  Filled 2022-04-08: qty 1

## 2022-04-08 NOTE — ED Triage Notes (Signed)
Pt via EMS from home. Pt c/o headache, states she has hx of migraine. Pt states she also had runny nose and congestion. Denies any fevers. Denies any sick contacts. States all these symptoms started 2 days ago. Pt is A&Ox4 and NAD

## 2022-04-08 NOTE — ED Triage Notes (Signed)
Pt in via EMS from home with c/o migraine for 2 days. 118/78,HR 92, CBG 86

## 2022-04-08 NOTE — Discharge Instructions (Addendum)
Your exam is normal and your COVID/flu test is negative.  Take over-the-counter Tylenol, Motrin, and Benadryl, along with the prescription nausea medicine for headache management.  Follow with primary provider return to the ED if needed.

## 2022-04-08 NOTE — ED Notes (Signed)
See triage note  Presents with h/a and runny nose for couple of days  No fever on arrival

## 2022-04-08 NOTE — ED Provider Notes (Signed)
[facility] Emergency Department Provider Note     Event Date/Time   First MD Initiated Contact with Patient 04/08/22 1157     (approximate)   History   Headache, Nasal Congestion, and Cough   HPI  [name] is a 66 y.o. female with a history of hypertension, COPD, COPD, and PVD, presents herself to the ED, via EMS from home.  Patient with a history of migraines complaints of headache with onset this morning.  She also notes associated runny nose and some sinus congestion.  She denies any fevers, cough, chest pain, or shortness of breath.     Physical Exam   Triage Vital Signs: ED Triage Vitals  Enc Vitals Group     BP 04/08/22 1155 (!) 162/87     Pulse Rate 04/08/22 1155 85     Resp 04/08/22 1155 20     Temp 04/08/22 1155 98.5 F (36.9 C)     Temp Source 04/08/22 1155 Oral     SpO2 04/08/22 1155 97 %     Weight 04/08/22 1153 110 lb (49.9 kg)     Height 04/08/22 1153 '5\' 4"'$  (1.626 m)     Head Circumference --      Peak Flow --      Pain Score 04/08/22 1152 10     Pain Loc --      Pain Edu? --      Excl. in [place]? --     Most recent vital signs: Vitals:   04/08/22 1155  BP: (!) 162/87  Pulse: 85  Resp: 20  Temp: 98.5 F (36.9 C)  SpO2: 97%    General Awake, no distress. NAD HEENT NCAT. PERRL. EOMI. No rhinorrhea. Mucous membranes are moist.  CV:  Good peripheral perfusion.  RESP:  Normal effort.  ABD:  No distention.    ED Results / Procedures / Treatments   Labs (all labs ordered are listed, but only abnormal results are displayed) Labs Reviewed  RESP PANEL BY RT-PCR (FLU A&B, COVID) [id number]     EKG   RADIOLOGY   No results found.   PROCEDURES:  Critical Care performed: No  Procedures   MEDICATIONS ORDERED IN ED: Medications  metoCLOPramide (REGLAN) tablet 10 mg (10 mg Oral Given 04/08/22 1238)  ketorolac (TORADOL) 30 MG/ML injection 30 mg (30 mg Intramuscular Given 04/08/22 1237)     IMPRESSION / MDM  / [place] / ED COURSE  I reviewed the triage vital signs and the nursing notes.                              Differential diagnosis includes, but is not limited to, cluster headache, migraine headache, viral URI, COVID, flu  Patient's presentation is most consistent with acute complicated illness / injury requiring diagnostic workup.  Patient to the ED for evaluation of what she describes as a typical headache without any other preceding injury, trauma, or fall.  Patient with some mild sinus congestion.  Exam is overall reassuring at this time as it shows no acute evidence of an intracranial process no neuromuscular deficit, and no red flags on exam.  Negative viral panel test today.  Patient reports significant improvement of pain at the time of her discharge, after ED medication administration.  Patient's diagnosis is consistent with headache/migraine. Patient will be discharged home with prescriptions for Reglan to take with OTC Tylenol or Motrin. Patient is to follow  up with primary provider as needed or otherwise directed. Patient is given ED precautions to return to the ED for any worsening or new symptoms.   FINAL CLINICAL IMPRESSION(S) / ED DIAGNOSES   Final diagnoses:  Other migraine without status migrainosus, not intractable     Rx / DC Orders   ED Discharge Orders          Ordered    metoCLOPramide (REGLAN) 10 MG tablet  3 times daily with meals        04/08/22 1340             Note:  This document was prepared using Dragon voice recognition software and may include unintentional dictation errors.    Melvenia Needles, PA-C 04/08/22 1806    Delman Kitten, MD 04/10/22 [phone]

## 2022-04-09 ENCOUNTER — Telehealth: Payer: Self-pay

## 2022-04-09 NOTE — Telephone Encounter (Signed)
Lvm to schedule ED f/u-Toni ?

## 2022-04-12 ENCOUNTER — Telehealth: Payer: Self-pay

## 2022-04-12 NOTE — Telephone Encounter (Signed)
Called patient to schedule ED follow up for migraine. She stated she is fine and will see provider in [facility]

## 2022-04-19 ENCOUNTER — Ambulatory Visit: Payer: Medicare Other | Admitting: Nurse Practitioner

## 2022-04-20 ENCOUNTER — Ambulatory Visit (INDEPENDENT_AMBULATORY_CARE_PROVIDER_SITE_OTHER): Payer: Medicare Other | Admitting: Nurse Practitioner

## 2022-04-20 ENCOUNTER — Encounter: Payer: Self-pay | Admitting: Nurse Practitioner

## 2022-04-20 VITALS — BP 140/70 | HR 69 | Temp 98.0°F | Resp 16 | Ht 64.0 in | Wt 115.0 lb

## 2022-04-20 DIAGNOSIS — G44209 Tension-type headache, unspecified, not intractable: Secondary | ICD-10-CM | POA: Diagnosis not present

## 2022-04-20 DIAGNOSIS — J0110 Acute frontal sinusitis, unspecified: Secondary | ICD-10-CM

## 2022-04-20 MED ORDER — DOXYCYCLINE HYCLATE 100 MG PO TABS: 100.0000 mg | ORAL_TABLET | Freq: Two times a day (BID) | ORAL | 0 refills | Status: DC

## 2022-04-20 MED ORDER — BUTALBITAL-APAP-CAFFEINE 50-325-40 MG PO TABS: 1.0000 | ORAL_TABLET | Freq: Four times a day (QID) | ORAL | 0 refills | Status: DC | PRN

## 2022-04-20 NOTE — Progress Notes (Signed)
[facility] [place], [place] [address]  Internal MEDICINE  Office Visit Note  Patient Name: [name]  [id number]  [id number]  Date of Service: 04/20/2022  Chief Complaint  Patient presents with   Acute Visit   Migraine    Comes and goes - effects vision, eyes watering     HPI [name] presents for an acute sick visit for headache.  --Migraine started yesterday morning, stopping for a while then started again this morning. Has been about 2 hours now, constant pain, 10/10.  --Eyes watering, sinus pressure on left side of face, nasal congestion, and runny nose.  No nausea or vomiting, no sensitivity to light or sound Headache described as throbbing, burning, aching and sharp Denies cough, chest tightness, SOB or wheezing.       Current Medication:  Outpatient Encounter Medications as of 04/20/2022  Medication Sig   albuterol (VENTOLIN HFA) 108 (90 Base) MCG/ACT inhaler Inhale 2 puffs into the lungs every 6 (six) hours as needed for wheezing or shortness of breath.   aspirin EC 81 MG EC tablet Take 1 tablet (81 mg total) by mouth daily.   atorvastatin (LIPITOR) 80 MG tablet Take 1 tablet (80 mg total) by mouth daily.   budesonide-formoterol (SYMBICORT) 80-4.5 MCG/ACT inhaler Inhale 2 puffs into the lungs daily.   butalbital-acetaminophen-caffeine (FIORICET) 50-325-40 MG tablet Take 1 tablet by mouth every 6 (six) hours as needed for headache.   chlorhexidine (PERIDEX) 0.12 % solution Use as directed 15 mLs in the mouth or throat 2 (two) times daily.   cholecalciferol (VITAMIN D) 400 units TABS tablet Take 400 Units by mouth.   doxycycline (VIBRA-TABS) 100 MG tablet Take 1 tablet (100 mg total) by mouth 2 (two) times daily. Take with food   ezetimibe (ZETIA) 10 MG tablet Take 1 tablet (10 mg total) by mouth daily.   ferrous sulfate 325 (65 FE) MG tablet Take 325 mg by mouth daily.   folic acid (FOLVITE) 1 MG tablet Take 1 tablet (1 mg total) by  mouth daily.   isosorbide mononitrate (IMDUR) 60 MG 24 hr tablet Take 1 tablet (60 mg total) by mouth daily.   losartan (COZAAR) 50 MG tablet Take 2 tablets by mouth once daily   Multiple Vitamin (MULTIVITAMIN WITH MINERALS) TABS tablet Take 1 tablet by mouth daily.   REPATHA SURECLICK 102 MG/ML SOAJ Inject 140 mg into the skin every 14 (fourteen) days.   thiamine 100 MG tablet Take 1 tablet (100 mg total) by mouth daily.   metoCLOPramide (REGLAN) 10 MG tablet Take 1 tablet (10 mg total) by mouth 3 (three) times daily with meals for 3 days.   No facility-administered encounter medications on file as of 04/20/2022.      Medical History: Past Medical History:  Diagnosis Date   Allergy    Environmental   Arthritis    Asthma    Carotid artery stenosis    Carotid stenosis    COPD (chronic obstructive pulmonary disease) (HCC)    Coronary artery disease    Coronary artery disease    Hemorrhoids    Hyperlipidemia    Hypertension    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(HCC)    negative cardiac cath   Nicotine addiction    Peripheral vascular disease (HCC)    Pneumonia    Shortness of breath dyspnea    Tobacco abuse      Vital Signs: BP (!) 160/81   Pulse 69   Temp 98 F (36.7 C)  Resp 16   Ht '5\' 4"'$  (1.626 m)   Wt 115 lb (52.2 kg)   LMP  (LMP Unknown)   SpO2 99%   BMI 19.74 kg/m    Review of Systems  Constitutional:  Negative for chills, fatigue and fever.  HENT:  Positive for congestion, postnasal drip, rhinorrhea, sinus pressure, sinus pain and sneezing. Negative for ear pain and sore throat.   Respiratory: Negative.  Negative for cough, chest tightness, shortness of breath and wheezing.   Cardiovascular: Negative.  Negative for chest pain and palpitations.  Neurological:  Positive for dizziness, light-headedness and headaches.    Physical Exam Constitutional:      General: She is not in acute distress.    Appearance: Normal appearance. She is normal weight. She is  ill-appearing.  HENT:     Head: Normocephalic and atraumatic.     Right Ear: Tympanic membrane, ear canal and external ear normal.     Left Ear: Tympanic membrane, ear canal and external ear normal.     Nose: Congestion and rhinorrhea present.     Right Sinus: Frontal sinus tenderness (severe) present.     Left Sinus: Frontal sinus tenderness (severe) present.     Mouth/Throat:     Mouth: Mucous membranes are moist.     Pharynx: Posterior oropharyngeal erythema present.  Eyes:     Pupils: Pupils are equal, round, and reactive to light.  Cardiovascular:     Rate and Rhythm: Normal rate and regular rhythm.     Heart sounds: Normal heart sounds. No murmur heard. Pulmonary:     Effort: Pulmonary effort is normal. No respiratory distress.     Breath sounds: Normal breath sounds. No wheezing.  Lymphadenopathy:     Cervical: No cervical adenopathy.  Neurological:     Mental Status: She is alert and oriented to person, place, and time.  Psychiatric:        Mood and Affect: Mood normal.        Behavior: Behavior normal.       Assessment/Plan: 1. Acute non-recurrent frontal sinusitis Empiric antibiotic treatment prescribed - butalbital-acetaminophen-caffeine (FIORICET) 50-325-40 MG tablet; Take 1 tablet by mouth every 6 (six) hours as needed for headache.  Dispense: 14 tablet; Refill: 0 - doxycycline (VIBRA-TABS) 100 MG tablet; Take 1 tablet (100 mg total) by mouth 2 (two) times daily. Take with food  Dispense: 20 tablet; Refill: 0  2. Acute non intractable tension-type headache Fioricet prescribed for acute headache - butalbital-acetaminophen-caffeine (FIORICET) 50-325-40 MG tablet; Take 1 tablet by mouth every 6 (six) hours as needed for headache.  Dispense: 14 tablet; Refill: 0   General Counseling: [name] verbalizes understanding of the findings of todays visit and agrees with plan of treatment. I have discussed any further diagnostic evaluation that may be needed or ordered  today. We also reviewed her medications today. she has been encouraged to call the office with any questions or concerns that should arise related to todays visit.    Counseling:    No orders of the defined types were placed in this encounter.   Meds ordered this encounter  Medications   butalbital-acetaminophen-caffeine (FIORICET) 50-325-40 MG tablet    Sig: Take 1 tablet by mouth every 6 (six) hours as needed for headache.    Dispense:  14 tablet    Refill:  0   doxycycline (VIBRA-TABS) 100 MG tablet    Sig: Take 1 tablet (100 mg total) by mouth 2 (two) times daily. Take with food  Dispense:  20 tablet    Refill:  0    Return if symptoms worsen or fail to improve.  [place] Controlled Substance Database was reviewed by me for overdose risk score (ORS)  Time spent:20 Minutes Time spent with patient included reviewing progress notes, labs, imaging studies, and discussing plan for follow up.   This patient was seen by Jonetta Osgood, FNP-C in collaboration with Dr. Clayborn Bigness as a part of collaborative care agreement.  Ronnica Dreese R. Valetta Fuller, MSN, FNP-C Internal Medicine

## 2022-05-25 ENCOUNTER — Ambulatory Visit (INDEPENDENT_AMBULATORY_CARE_PROVIDER_SITE_OTHER): Payer: Medicare Other | Admitting: Nurse Practitioner

## 2022-05-25 ENCOUNTER — Encounter: Payer: Self-pay | Admitting: Nurse Practitioner

## 2022-05-25 VITALS — BP 130/70 | HR 79 | Temp 97.0°F | Resp 16 | Ht 64.0 in | Wt 110.0 lb

## 2022-05-25 DIAGNOSIS — E782 Mixed hyperlipidemia: Secondary | ICD-10-CM | POA: Diagnosis not present

## 2022-05-25 DIAGNOSIS — R300 Dysuria: Secondary | ICD-10-CM

## 2022-05-25 DIAGNOSIS — F17209 Nicotine dependence, unspecified, with unspecified nicotine-induced disorders: Secondary | ICD-10-CM | POA: Diagnosis not present

## 2022-05-25 DIAGNOSIS — Z0001 Encounter for general adult medical examination with abnormal findings: Secondary | ICD-10-CM

## 2022-05-25 DIAGNOSIS — E559 Vitamin D deficiency, unspecified: Secondary | ICD-10-CM

## 2022-05-25 DIAGNOSIS — Z760 Encounter for issue of repeat prescription: Secondary | ICD-10-CM

## 2022-05-25 DIAGNOSIS — Z1231 Encounter for screening mammogram for malignant neoplasm of breast: Secondary | ICD-10-CM

## 2022-05-25 DIAGNOSIS — F1090 Alcohol use, unspecified, uncomplicated: Secondary | ICD-10-CM | POA: Diagnosis not present

## 2022-05-25 MED ORDER — ATORVASTATIN CALCIUM 80 MG PO TABS: 80.0000 mg | ORAL_TABLET | Freq: Every day | ORAL | 3 refills | Status: DC

## 2022-05-25 MED ORDER — EZETIMIBE 10 MG PO TABS: 10.0000 mg | ORAL_TABLET | Freq: Every day | ORAL | 3 refills | Status: DC

## 2022-05-25 MED ORDER — BUTALBITAL-APAP-CAFFEINE 50-325-40 MG PO TABS: 1.0000 | ORAL_TABLET | Freq: Four times a day (QID) | ORAL | 4 refills | Status: DC | PRN

## 2022-05-25 MED ORDER — ISOSORBIDE MONONITRATE ER 60 MG PO TB24: 60.0000 mg | ORAL_TABLET | Freq: Every day | ORAL | 0 refills | Status: DC

## 2022-05-25 MED ORDER — LOSARTAN POTASSIUM 50 MG PO TABS: 100.0000 mg | ORAL_TABLET | Freq: Every day | ORAL | 3 refills | Status: DC

## 2022-05-25 NOTE — Progress Notes (Signed)
[facility] [place], [place] [address]  Internal MEDICINE  Office Visit Note  Patient Name: [name]  [id number]  [id number]  Date of Service: 05/25/2022  Chief Complaint  Patient presents with   Medicare Wellness    HPI Shamira presents for an annual well visit and physical exam.  Well-appearing 66 year-old female with hypertension, CAD, COPD, tobacco use disorder, alcohol use disorder.  --Routine Colonsocopy due in 2026, routine mammogram due in November. Dexa scan done last year --smokes 1 ppd cigarettes, not ready to quit --drinks alcohol daily, prefers natural light beer, understand risks of alcohol use, not ready to quit.  --BP controlled with current medications.  --on high dose statin therapy, with zetia and repatha injections, followed by cardiology   Current Medication: Outpatient Encounter Medications as of 05/25/2022  Medication Sig   albuterol (VENTOLIN HFA) 108 (90 Base) MCG/ACT inhaler Inhale 2 puffs into the lungs every 6 (six) hours as needed for wheezing or shortness of breath.   aspirin EC 81 MG EC tablet Take 1 tablet (81 mg total) by mouth daily.   budesonide-formoterol (SYMBICORT) 80-4.5 MCG/ACT inhaler Inhale 2 puffs into the lungs daily.   chlorhexidine (PERIDEX) 0.12 % solution Use as directed 15 mLs in the mouth or throat 2 (two) times daily.   cholecalciferol (VITAMIN D) 400 units TABS tablet Take 400 Units by mouth.   ferrous sulfate 325 (65 FE) MG tablet Take 325 mg by mouth daily.   folic acid (FOLVITE) 1 MG tablet Take 1 tablet (1 mg total) by mouth daily.   Multiple Vitamin (MULTIVITAMIN WITH MINERALS) TABS tablet Take 1 tablet by mouth daily.   REPATHA SURECLICK 177 MG/ML SOAJ Inject 140 mg into the skin every 14 (fourteen) days.   thiamine 100 MG tablet Take 1 tablet (100 mg total) by mouth daily.   [DISCONTINUED] atorvastatin (LIPITOR) 80 MG tablet Take 1 tablet (80 mg total) by mouth daily.   [DISCONTINUED]  butalbital-acetaminophen-caffeine (FIORICET) 50-325-40 MG tablet Take 1 tablet by mouth every 6 (six) hours as needed for headache.   [DISCONTINUED] doxycycline (VIBRA-TABS) 100 MG tablet Take 1 tablet (100 mg total) by mouth 2 (two) times daily. Take with food   [DISCONTINUED] ezetimibe (ZETIA) 10 MG tablet Take 1 tablet (10 mg total) by mouth daily.   [DISCONTINUED] isosorbide mononitrate (IMDUR) 60 MG 24 hr tablet Take 1 tablet (60 mg total) by mouth daily.   [DISCONTINUED] losartan (COZAAR) 50 MG tablet Take 2 tablets by mouth once daily   atorvastatin (LIPITOR) 80 MG tablet Take 1 tablet (80 mg total) by mouth daily.   butalbital-acetaminophen-caffeine (FIORICET) 50-325-40 MG tablet Take 1 tablet by mouth every 6 (six) hours as needed for headache.   ezetimibe (ZETIA) 10 MG tablet Take 1 tablet (10 mg total) by mouth daily.   isosorbide mononitrate (IMDUR) 60 MG 24 hr tablet Take 1 tablet (60 mg total) by mouth daily.   losartan (COZAAR) 50 MG tablet Take 2 tablets (100 mg total) by mouth daily.   [DISCONTINUED] metoCLOPramide (REGLAN) 10 MG tablet Take 1 tablet (10 mg total) by mouth 3 (three) times daily with meals for 3 days.   No facility-administered encounter medications on file as of 05/25/2022.    Surgical History: Past Surgical History:  Procedure Laterality Date   ABDOMINAL HYSTERECTOMY     APPENDECTOMY     CARDIAC CATHETERIZATION     CARDIAC SURGERY     CHOLECYSTECTOMY     COLONOSCOPY  12/15/11  OH->bleeding internal hemorrhoids, otherwise normal   COLONOSCOPY WITH PROPOFOL N/A 04/12/2015   Procedure: COLONOSCOPY WITH PROPOFOL;  Surgeon: Midge Minium, MD;  Location: [facility] ENDOSCOPY;  Service: Endoscopy;  Laterality: N/A;   COLONOSCOPY WITH PROPOFOL N/A 01/05/2020   Procedure: COLONOSCOPY WITH PROPOFOL;  Surgeon: Midge Minium, MD;  Location[facility] ENDOSCOPY;  Service: Endoscopy;  Laterality: N/A;   ENDARTERECTOMY FEMORAL Left 04/26/2015   Procedure: ENDARTERECTOMY FEMORAL;   Surgeon: Annice Needy, MD;  Location: [facility] ORS;  Service: Vascular;  Laterality: Left;   ENDOVASCULAR STENT INSERTION Bilateral    Legs   FLEXIBLE BRONCHOSCOPY N/A 02/02/2015   Procedure: FLEXIBLE BRONCHOSCOPY;  Surgeon: Yevonne Pax, MD;  Location: [facility] ORS;  Service: Pulmonary;  Laterality: N/A;   HEMORRHOID SURGERY     PERIPHERAL VASCULAR CATHETERIZATION Left 04/25/2015   Procedure: Lower Extremity Angiography;  Surgeon: Annice Needy, MD;  Location: [facility] INVASIVE CV LAB;  Service: Cardiovascular;  Laterality: Left;   PERIPHERAL VASCULAR CATHETERIZATION Left 04/25/2015   Procedure: Lower Extremity Intervention;  Surgeon: Annice Needy, MD;  Location: [facility] INVASIVE CV LAB;  Service: Cardiovascular;  Laterality: Left;   PERIPHERAL VASCULAR CATHETERIZATION Left 01/02/2016   Procedure: Lower Extremity Angiography;  Surgeon: Annice Needy, MD;  Location: [facility] INVASIVE CV LAB;  Service: Cardiovascular;  Laterality: Left;   PERIPHERAL VASCULAR CATHETERIZATION  01/02/2016   Procedure: Lower Extremity Intervention;  Surgeon: Annice Needy, MD;  Location: [facility] INVASIVE CV LAB;  Service: Cardiovascular;;   THROMBECTOMY FEMORAL ARTERY Left 04/26/2015   Procedure: THROMBECTOMY FEMORAL ARTERY;  Surgeon: Annice Needy, MD;  Location: [facility] ORS;  Service: Vascular;  Laterality: Left;    Medical History: Past Medical History:  Diagnosis Date   Allergy    Environmental   Arthritis    Asthma    Carotid artery stenosis    Carotid stenosis    COPD (chronic obstructive pulmonary disease) (HCC)    Coronary artery disease    Coronary artery disease    Hemorrhoids    Hyperlipidemia    Hypertension    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(HCC)    negative cardiac cath   Nicotine addiction    Peripheral vascular disease (HCC)    Pneumonia    Shortness of breath dyspnea    Tobacco abuse     Family History: Family History  Problem Relation Age of Onset   Hypertension Mother    Hypertension Father     Social History    Socioeconomic History   Marital status: Single    Spouse name: Not on file   Number of children: Not on file   Years of education: Not on file   Highest education level: Not on file  Occupational History   Not on file  Tobacco Use   Smoking status: Every Day    Packs/day: 1.00    Years: 41.00    Total pack years: 41.00    Types: Cigarettes   Smokeless tobacco: Never  Vaping Use   Vaping Use: Never used  Substance and Sexual Activity   Alcohol use: Yes    Alcohol/week: 0.0 standard drinks of alcohol    Comment: weekends, couple beers, pint of liquer only on weekends.  Last drank approx 1 month ago.   Drug use: No   Sexual activity: Not Currently  Other Topics Concern   Not on file  Social History Narrative   Lives with family    Social Determinants of Health   Financial Resource Strain: Low Risk  (07/20/2021)  Overall Financial Resource Strain (CARDIA)    Difficulty of Paying Living Expenses: Not very hard  Food Insecurity: No Food Insecurity (04/04/2018)   Hunger Vital Sign    Worried About Running Out of Food in the Last Year: Never true    Ran Out of Food in the Last Year: Never true  Transportation Needs: No Transportation Needs (04/04/2018)   PRAPARE - Hydrologist (Medical): No    Lack of Transportation (Non-Medical): No  Physical Activity: Inactive (04/04/2018)   Exercise Vital Sign    Days of Exercise per Week: 0 days    Minutes of Exercise per Session: 0 min  Stress: No Stress Concern Present (04/04/2018)   [place]    Feeling of Stress : Only a little  Social Connections: Moderately Isolated (04/04/2018)   Social Connection and Isolation Panel [NHANES]    Frequency of Communication with Friends and Family: Once a week    Frequency of Social Gatherings with Friends and Family: Once a week    Attends Religious Services: 1 to 4 times per year    Active Member of [employer]  or Organizations: No    Attends Archivist Meetings: Never    Marital Status: Never married  Intimate Partner Violence: Not At Risk (04/04/2018)   Humiliation, Afraid, Rape, and Kick questionnaire    Fear of Current or Ex-Partner: No    Emotionally Abused: No    Physically Abused: No    Sexually Abused: No      Review of Systems  Constitutional:  Negative for activity change, appetite change, chills, fatigue, fever and unexpected weight change.  HENT: Negative.  Negative for congestion, ear pain, rhinorrhea, sore throat and trouble swallowing.   Eyes: Negative.   Respiratory: Negative.  Negative for cough, chest tightness, shortness of breath and wheezing.   Cardiovascular: Negative.  Negative for chest pain.  Gastrointestinal: Negative.  Negative for abdominal pain, blood in stool, constipation, diarrhea, nausea and vomiting.  Endocrine: Negative.   Genitourinary: Negative.  Negative for difficulty urinating, dysuria, frequency, hematuria and urgency.  Musculoskeletal: Negative.  Negative for arthralgias, back pain, joint swelling, myalgias and neck pain.  Skin: Negative.  Negative for rash and wound.  Allergic/Immunologic: Negative.  Negative for immunocompromised state.  Neurological: Negative.  Negative for dizziness, seizures, numbness and headaches.  Hematological: Negative.   Psychiatric/Behavioral: Negative.  Negative for behavioral problems, self-injury and suicidal ideas. The patient is not nervous/anxious.     Vital Signs: BP 130/70 Comment: 177/98  Pulse 79   Temp (!) 97 F (36.1 C)   Resp 16   Ht $R'5\' 4"'KV$  (1.626 m)   Wt 110 lb (49.9 kg)   LMP  (LMP Unknown)   SpO2 97%   BMI 18.88 kg/m    Physical Exam Vitals reviewed.  Constitutional:      General: She is awake. She is not in acute distress.    Appearance: Normal appearance. She is well-developed, well-groomed and normal weight. She is not ill-appearing or diaphoretic.  HENT:     Head: Normocephalic  and atraumatic.     Right Ear: Tympanic membrane, ear canal and external ear normal.     Left Ear: Tympanic membrane, ear canal and external ear normal.     Nose: Nose normal. No congestion or rhinorrhea.     Mouth/Throat:     Mouth: Mucous membranes are moist.     Pharynx: Oropharynx is clear. No oropharyngeal  exudate or posterior oropharyngeal erythema.  Eyes:     General: Lids are normal. Vision grossly intact. Gaze aligned appropriately. No scleral icterus.       Right eye: No discharge.        Left eye: No discharge.     Conjunctiva/sclera: Conjunctivae normal.     Pupils: Pupils are equal, round, and reactive to light.     Funduscopic exam:    Right eye: Red reflex present.        Left eye: Red reflex present. Neck:     Thyroid: No thyromegaly.     Vascular: No JVD.     Trachea: No tracheal deviation.  Cardiovascular:     Rate and Rhythm: Normal rate and regular rhythm.     Pulses: Normal pulses.     Heart sounds: Normal heart sounds, S1 normal and S2 normal. No murmur heard.    No friction rub. No gallop.  Pulmonary:     Effort: Pulmonary effort is normal. No accessory muscle usage or respiratory distress.     Breath sounds: Normal breath sounds and air entry. No stridor. No wheezing or rales.  Chest:     Chest wall: No tenderness.  Breasts:    Breasts are symmetrical.     Right: Normal. No swelling, bleeding, inverted nipple, mass, nipple discharge, skin change or tenderness.     Left: Normal. No swelling, bleeding, inverted nipple, mass, nipple discharge, skin change or tenderness.  Abdominal:     General: Bowel sounds are normal. There is no distension.     Palpations: Abdomen is soft. There is no shifting dullness, fluid wave, mass or pulsatile mass.     Tenderness: There is no abdominal tenderness. There is no guarding or rebound.  Musculoskeletal:        General: No tenderness or deformity. Normal range of motion.     Cervical back: Normal range of motion and neck  supple.     Right lower leg: No edema.     Left lower leg: No edema.  Lymphadenopathy:     Cervical: No cervical adenopathy.     Upper Body:     Right upper body: No supraclavicular, axillary or pectoral adenopathy.     Left upper body: No supraclavicular, axillary or pectoral adenopathy.  Skin:    General: Skin is warm and dry.     Capillary Refill: Capillary refill takes less than 2 seconds.     Coloration: Skin is not pale.     Findings: No erythema or rash.  Neurological:     Mental Status: She is alert and oriented to person, place, and time.     Cranial Nerves: No cranial nerve deficit.     Motor: No abnormal muscle tone.     Coordination: Coordination normal.     Deep Tendon Reflexes: Reflexes are normal and symmetric.  Psychiatric:        Mood and Affect: Mood normal.        Behavior: Behavior normal. Behavior is cooperative.        Thought Content: Thought content normal.        Judgment: Judgment normal.        Assessment/Plan: 1. Encounter for routine adult health examination with abnormal findings Age-appropriate preventive screenings and vaccinations discussed, annual physical exam completed. Routine labs for health maintenance ordered, see below. PHM updated.  - CBC with Differential/Platelet - CMP14+EGFR - Vitamin D (25 hydroxy) - Lipid Profile - TSH + free T4  2. Mixed  hyperlipidemia Taking atorvastatin 80 mg daily, zetia 10 mg daily, and repatha 140 mg injection every 2 weeks, continue as prescribed. Sees cardiology.  - CBC with Differential/Platelet - CMP14+EGFR - Lipid Profile - TSH + free T4  3. Vitamin D deficiency Routine lab ordered - Vitamin D (25 hydroxy)  4. Encounter for screening mammogram for malignant neoplasm of breast Patient will call [place] to schedule - MM 3D SCREEN BREAST BILATERAL; Future  5. Tobacco use disorder, continuous 1 ppd; not ready to quit, declined smoking cessation counseling  6. Alcohol use disorder Routine  labs ordered, including liver function tests to monitor for liver disease. Discussed quitting, patient not ready, declined assistance. She acknowledges that she will call if she decides to quit and needs help.  - CBC with Differential/Platelet - CMP14+EGFR - Vitamin D (25 hydroxy) - Lipid Profile - TSH + free T4  7. Dysuria Routine urinalysis done - UA/M w/rflx Culture, Routine  8. Medication refill - atorvastatin (LIPITOR) 80 MG tablet; Take 1 tablet (80 mg total) by mouth daily.  Dispense: 90 tablet; Refill: 3 - butalbital-acetaminophen-caffeine (FIORICET) 50-325-40 MG tablet; Take 1 tablet by mouth every 6 (six) hours as needed for headache.  Dispense: 14 tablet; Refill: 4 - ezetimibe (ZETIA) 10 MG tablet; Take 1 tablet (10 mg total) by mouth daily.  Dispense: 90 tablet; Refill: 3 - losartan (COZAAR) 50 MG tablet; Take 2 tablets (100 mg total) by mouth daily.  Dispense: 180 tablet; Refill: 3 - isosorbide mononitrate (IMDUR) 60 MG 24 hr tablet; Take 1 tablet (60 mg total) by mouth daily.  Dispense: 90 tablet; Refill: 0     General Counseling: Lawana verbalizes understanding of the findings of todays visit and agrees with plan of treatment. I have discussed any further diagnostic evaluation that may be needed or ordered today. We also reviewed her medications today. she has been encouraged to call the office with any questions or concerns that should arise related to todays visit.    Orders Placed This Encounter  Procedures   MM 3D SCREEN BREAST BILATERAL   CBC with Differential/Platelet   CMP14+EGFR   Vitamin D (25 hydroxy)   Lipid Profile   TSH + free T4    Meds ordered this encounter  Medications   atorvastatin (LIPITOR) 80 MG tablet    Sig: Take 1 tablet (80 mg total) by mouth daily.    Dispense:  90 tablet    Refill:  3   butalbital-acetaminophen-caffeine (FIORICET) 50-325-40 MG tablet    Sig: Take 1 tablet by mouth every 6 (six) hours as needed for headache.     Dispense:  14 tablet    Refill:  4   ezetimibe (ZETIA) 10 MG tablet    Sig: Take 1 tablet (10 mg total) by mouth daily.    Dispense:  90 tablet    Refill:  3   losartan (COZAAR) 50 MG tablet    Sig: Take 2 tablets (100 mg total) by mouth daily.    Dispense:  180 tablet    Refill:  3   isosorbide mononitrate (IMDUR) 60 MG 24 hr tablet    Sig: Take 1 tablet (60 mg total) by mouth daily.    Dispense:  90 tablet    Refill:  0    Return in about 6 months (around 11/23/2022) for F/U, med refill, Hedy Garro PCP.   Total time spent:30 Minutes Time spent includes review of chart, medications, test results, and follow up plan with the patient.  [place] Controlled Substance Database was reviewed by me.  This patient was seen by Jonetta Osgood, FNP-C in collaboration with Dr. Clayborn Bigness as a part of collaborative care agreement.  Marien Manship R. Valetta Fuller, MSN, FNP-C Internal medicine

## 2022-05-28 DIAGNOSIS — E876 Hypokalemia: Secondary | ICD-10-CM | POA: Diagnosis not present

## 2022-05-28 DIAGNOSIS — E559 Vitamin D deficiency, unspecified: Secondary | ICD-10-CM | POA: Diagnosis not present

## 2022-05-28 DIAGNOSIS — J0110 Acute frontal sinusitis, unspecified: Secondary | ICD-10-CM | POA: Diagnosis not present

## 2022-05-28 DIAGNOSIS — I10 Essential (primary) hypertension: Secondary | ICD-10-CM | POA: Diagnosis not present

## 2022-05-28 DIAGNOSIS — E782 Mixed hyperlipidemia: Secondary | ICD-10-CM | POA: Diagnosis not present

## 2022-05-29 LAB — CBC WITH DIFFERENTIAL/PLATELET
Basophils Absolute: 0 10*3/uL (ref 0.0–0.2)
Basos: 0 %
EOS (ABSOLUTE): 0 10*3/uL (ref 0.0–0.4)
Eos: 0 %
Hematocrit: 36.6 % (ref 34.0–46.6)
Hemoglobin: 12.7 g/dL (ref 11.1–15.9)
Immature Grans (Abs): 0 10*3/uL (ref 0.0–0.1)
Immature Granulocytes: 0 %
Lymphocytes Absolute: 1.5 10*3/uL (ref 0.7–3.1)
Lymphs: 26 %
MCH: 33.3 pg — ABNORMAL HIGH (ref 26.6–33.0)
MCHC: 34.7 g/dL (ref 31.5–35.7)
MCV: 96 fL (ref 79–97)
Monocytes Absolute: 0.6 10*3/uL (ref 0.1–0.9)
Monocytes: 10 %
Neutrophils Absolute: 3.5 10*3/uL (ref 1.4–7.0)
Neutrophils: 64 %
Platelets: 151 10*3/uL (ref 150–450)
RBC: 3.81 x10E6/uL (ref 3.77–5.28)
RDW: 12.9 % (ref 11.7–15.4)
WBC: 5.5 10*3/uL (ref 3.4–10.8)

## 2022-05-29 LAB — CMP14+EGFR
ALT: 11 IU/L (ref 0–32)
AST: 20 IU/L (ref 0–40)
Albumin/Globulin Ratio: 2.1 (ref 1.2–2.2)
Albumin: 4.7 g/dL (ref 3.9–4.9)
Alkaline Phosphatase: 66 IU/L (ref 44–121)
BUN/Creatinine Ratio: 13 (ref 12–28)
BUN: 13 mg/dL (ref 8–27)
Bilirubin Total: 0.4 mg/dL (ref 0.0–1.2)
CO2: 18 mmol/L — ABNORMAL LOW (ref 20–29)
Calcium: 10.4 mg/dL — ABNORMAL HIGH (ref 8.7–10.3)
Chloride: 102 mmol/L (ref 96–106)
Creatinine, Ser: 0.98 mg/dL (ref 0.57–1.00)
Globulin, Total: 2.2 g/dL (ref 1.5–4.5)
Glucose: 88 mg/dL (ref 70–99)
Potassium: 4.2 mmol/L (ref 3.5–5.2)
Sodium: 140 mmol/L (ref 134–144)
Total Protein: 6.9 g/dL (ref 6.0–8.5)
eGFR: 64 mL/min/{1.73_m2} (ref >59)

## 2022-05-29 LAB — LIPID PANEL
Chol/HDL Ratio: 1.9 ratio (ref 0.0–4.4)
Cholesterol, Total: 149 mg/dL (ref 100–199)
HDL: 79 mg/dL (ref >39)
LDL Chol Calc (NIH): 55 mg/dL (ref 0–99)
Triglycerides: 76 mg/dL (ref 0–149)
VLDL Cholesterol Cal: 15 mg/dL (ref 5–40)

## 2022-05-29 LAB — UA/M W/RFLX CULTURE, ROUTINE
Bilirubin, UA: NEGATIVE
Glucose, UA: NEGATIVE
Ketones, UA: NEGATIVE
Nitrite, UA: NEGATIVE
RBC, UA: NEGATIVE
Specific Gravity, UA: 1.016 (ref 1.005–1.030)
Urobilinogen, Ur: 0.2 mg/dL (ref 0.2–1.0)
pH, UA: 5.5 (ref 5.0–7.5)

## 2022-05-29 LAB — TSH+FREE T4
Free T4: 1.32 ng/dL (ref 0.82–1.77)
TSH: 1.01 u[IU]/mL (ref 0.450–4.500)

## 2022-05-29 LAB — MICROSCOPIC EXAMINATION
Bacteria, UA: NONE SEEN
Casts: NONE SEEN /lpf

## 2022-05-29 LAB — URINE CULTURE, REFLEX

## 2022-05-29 LAB — VITAMIN D 25 HYDROXY (VIT D DEFICIENCY, FRACTURES): Vit D, 25-Hydroxy: 31.6 ng/mL (ref 30.0–100.0)

## 2022-06-14 ENCOUNTER — Other Ambulatory Visit (INDEPENDENT_AMBULATORY_CARE_PROVIDER_SITE_OTHER): Payer: Self-pay | Admitting: Nurse Practitioner

## 2022-06-14 DIAGNOSIS — I6523 Occlusion and stenosis of bilateral carotid arteries: Secondary | ICD-10-CM

## 2022-06-14 DIAGNOSIS — I70213 Atherosclerosis of native arteries of extremities with intermittent claudication, bilateral legs: Secondary | ICD-10-CM

## 2022-06-19 ENCOUNTER — Ambulatory Visit (INDEPENDENT_AMBULATORY_CARE_PROVIDER_SITE_OTHER): Payer: Medicare Other

## 2022-06-19 ENCOUNTER — Encounter (INDEPENDENT_AMBULATORY_CARE_PROVIDER_SITE_OTHER): Payer: Self-pay | Admitting: Vascular Surgery

## 2022-06-19 ENCOUNTER — Ambulatory Visit (INDEPENDENT_AMBULATORY_CARE_PROVIDER_SITE_OTHER): Payer: Medicare Other | Admitting: Vascular Surgery

## 2022-06-19 VITALS — BP 123/86 | HR 71 | Resp 17 | Ht 64.0 in | Wt 110.8 lb

## 2022-06-19 DIAGNOSIS — I6523 Occlusion and stenosis of bilateral carotid arteries: Secondary | ICD-10-CM

## 2022-06-19 DIAGNOSIS — I10 Essential (primary) hypertension: Secondary | ICD-10-CM

## 2022-06-19 DIAGNOSIS — I70213 Atherosclerosis of native arteries of extremities with intermittent claudication, bilateral legs: Secondary | ICD-10-CM | POA: Diagnosis not present

## 2022-06-19 DIAGNOSIS — E782 Mixed hyperlipidemia: Secondary | ICD-10-CM

## 2022-06-19 NOTE — Assessment & Plan Note (Signed)
lipid control important in reducing the progression of atherosclerotic disease. Continue statin therapy  

## 2022-06-19 NOTE — Assessment & Plan Note (Signed)
blood pressure control important in reducing the progression of atherosclerotic disease. On appropriate oral medications.  

## 2022-06-19 NOTE — Assessment & Plan Note (Signed)
Duplex today demonstrates 1 to 39% right ICA stenosis and 40 to 59% left ICA stenosis.  No role for intervention at that level.  On appropriate medical therapy.  Recheck in 1 year.

## 2022-06-19 NOTE — Progress Notes (Signed)
MRN : [id number]  [name] is a 66 y.o. (1955-12-02) female who presents with chief complaint of No chief complaint on file. Marland Kitchen  History of Present Illness: Patient returns today in follow up of multiple vascular issues.  She is doing reasonably well today.  She has noticed some numbness and tingling as well as pain in the toes on her left foot.  Her left foot does tire easily with activity and she recently had a fall so her left leg is much more sore and swollen than normal.  She has had multiple interventions to both lower extremities a little more on the left than the right but none in the last 5 years.  ABIs today are stable on the right at 1.02 but have dropped on the left down to 0.72. She is also followed for carotid disease.  No focal neurologic symptoms. Specifically, the patient denies amaurosis fugax, speech or swallowing difficulties, or arm or leg weakness or numbness. Duplex today demonstrates 1 to 39% right ICA stenosis and 40 to 59% left ICA stenosis.  Current Outpatient Medications  Medication Sig Dispense Refill   albuterol (VENTOLIN HFA) 108 (90 Base) MCG/ACT inhaler Inhale 2 puffs into the lungs every 6 (six) hours as needed for wheezing or shortness of breath. 18 g 3   aspirin EC 81 MG EC tablet Take 1 tablet (81 mg total) by mouth daily. 90 tablet 1   atorvastatin (LIPITOR) 80 MG tablet Take 1 tablet (80 mg total) by mouth daily. 90 tablet 3   budesonide-formoterol (SYMBICORT) 80-4.5 MCG/ACT inhaler Inhale 2 puffs into the lungs daily. 3 each 1   butalbital-acetaminophen-caffeine (FIORICET) 50-325-40 MG tablet Take 1 tablet by mouth every 6 (six) hours as needed for headache. 14 tablet 4   cholecalciferol (VITAMIN D) 400 units TABS tablet Take 400 Units by mouth.     ezetimibe (ZETIA) 10 MG tablet Take 1 tablet (10 mg total) by mouth daily. 90 tablet 3   ferrous sulfate 325 (65 FE) MG tablet Take 325 mg by mouth daily.     losartan (COZAAR) 50 MG tablet Take 2 tablets  (100 mg total) by mouth daily. 180 tablet 3   REPATHA SURECLICK 867 MG/ML SOAJ Inject 140 mg into the skin every 14 (fourteen) days.     chlorhexidine (PERIDEX) 0.12 % solution Use as directed 15 mLs in the mouth or throat 2 (two) times daily. (Patient not taking: Reported on 61/95/0932) 671 mL 0   folic acid (FOLVITE) 1 MG tablet Take 1 tablet (1 mg total) by mouth daily. (Patient not taking: Reported on 06/19/2022) 90 tablet 1   isosorbide mononitrate (IMDUR) 60 MG 24 hr tablet Take 1 tablet (60 mg total) by mouth daily. (Patient not taking: Reported on 06/19/2022) 90 tablet 0   Multiple Vitamin (MULTIVITAMIN WITH MINERALS) TABS tablet Take 1 tablet by mouth daily. (Patient not taking: Reported on 06/19/2022)     thiamine 100 MG tablet Take 1 tablet (100 mg total) by mouth daily. (Patient not taking: Reported on 06/19/2022) 90 tablet 0   No current facility-administered medications for this visit.    Past Medical History:  Diagnosis Date   Allergy    Environmental   Arthritis    Asthma    Carotid artery stenosis    Carotid stenosis    COPD (chronic obstructive pulmonary disease) (HCC)    Coronary artery disease    Coronary artery disease    Hemorrhoids    Hyperlipidemia  Hypertension    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(HCC)    negative cardiac cath   Nicotine addiction    Peripheral vascular disease (HCC)    Pneumonia    Shortness of breath dyspnea    Tobacco abuse     Past Surgical History:  Procedure Laterality Date   ABDOMINAL HYSTERECTOMY     APPENDECTOMY     CARDIAC CATHETERIZATION     CARDIAC SURGERY     CHOLECYSTECTOMY     COLONOSCOPY  12/15/11   OH->bleeding internal hemorrhoids, otherwise normal   COLONOSCOPY WITH PROPOFOL N/A 04/12/2015   Procedure: COLONOSCOPY WITH PROPOFOL;  Surgeon: Lucilla Lame, MD;  Location: [facility] ENDOSCOPY;  Service: Endoscopy;  Laterality: N/A;   COLONOSCOPY WITH PROPOFOL N/A 01/05/2020   Procedure: COLONOSCOPY WITH PROPOFOL;  Surgeon: Lucilla Lame,  MD;  Location: [facility] ENDOSCOPY;  Service: Endoscopy;  Laterality: N/A;   ENDARTERECTOMY FEMORAL Left 04/26/2015   Procedure: ENDARTERECTOMY FEMORAL;  Surgeon: Algernon Huxley, MD;  Location: [facility] ORS;  Service: Vascular;  Laterality: Left;   ENDOVASCULAR STENT INSERTION Bilateral    Legs   FLEXIBLE BRONCHOSCOPY N/A 02/02/2015   Procedure: FLEXIBLE BRONCHOSCOPY;  Surgeon: Allyne Gee, MD;  Location: [facility] ORS;  Service: Pulmonary;  Laterality: N/A;   HEMORRHOID SURGERY     PERIPHERAL VASCULAR CATHETERIZATION Left 04/25/2015   Procedure: Lower Extremity Angiography;  Surgeon: Algernon Huxley, MD;  Location: [place] [facility];  Service: Cardiovascular;  Laterality: Left;   PERIPHERAL VASCULAR CATHETERIZATION Left 04/25/2015   Procedure: Lower Extremity Intervention;  Surgeon: Algernon Huxley, MD;  Location: [place] [facility];  Service: Cardiovascular;  Laterality: Left;   PERIPHERAL VASCULAR CATHETERIZATION Left 01/02/2016   Procedure: Lower Extremity Angiography;  Surgeon: Algernon Huxley, MD;  Location: [place] [facility];  Service: Cardiovascular;  Laterality: Left;   PERIPHERAL VASCULAR CATHETERIZATION  01/02/2016   Procedure: Lower Extremity Intervention;  Surgeon: Algernon Huxley, MD;  Location: [place] [facility];  Service: Cardiovascular;;   THROMBECTOMY FEMORAL ARTERY Left 04/26/2015   Procedure: THROMBECTOMY FEMORAL ARTERY;  Surgeon: Algernon Huxley, MD;  Location: [facility] ORS;  Service: Vascular;  Laterality: Left;     Social History   Tobacco Use   Smoking status: Every Day    Packs/day: 1.00    Years: 41.00    Total pack years: 41.00    Types: Cigarettes   Smokeless tobacco: Never  Vaping Use   Vaping Use: Never used  Substance Use Topics   Alcohol use: Yes    Alcohol/week: 0.0 standard drinks of alcohol    Comment: weekends, couple beers, pint of liquer only on weekends.  Last drank approx 1 month ago.   Drug use: No      Family History  Problem Relation Age of Onset   Hypertension Mother     Hypertension Father   No bleeding or clotting disorders  Allergies  Allergen Reactions   Aspirin Nausea And Vomiting and Other (See Comments)    Pt states aspirin makes her cramp and have to use coated kind.   Tramadol    Zyrtec [Cetirizine]      REVIEW OF SYSTEMS (Negative unless checked)  Constitutional: []Weight loss  []Fever  []Chills Cardiac: []Chest pain   []Chest pressure   []Palpitations   []Shortness of breath when laying flat   []Shortness of breath at rest   [x]Shortness of breath with exertion. Vascular:  [x]Pain in legs with walking   []Pain in legs at rest   []Pain in  legs when laying flat   []Claudication   []Pain in feet when walking  [x]Pain in feet at rest  []Pain in feet when laying flat   []History of DVT   []Phlebitis   []Swelling in legs   []Varicose veins   []Non-healing ulcers Pulmonary:   []Uses home oxygen   []Productive cough   []Hemoptysis   []Wheeze  [x]COPD   [x]Asthma Neurologic:  []Dizziness  []Blackouts   []Seizures   []History of stroke   []History of TIA  []Aphasia   []Temporary blindness   []Dysphagia   []Weakness or numbness in arms   []Weakness or numbness in legs Musculoskeletal:  [x]Arthritis   []Joint swelling   [x]Joint pain   []Low back pain Hematologic:  []Easy bruising  []Easy bleeding   []Hypercoagulable state   []Anemic   Gastrointestinal:  []Blood in stool   []Vomiting blood  []Gastroesophageal reflux/heartburn   []Abdominal pain Genitourinary:  []Chronic kidney disease   []Difficult urination  []Frequent urination  []Burning with urination   []Hematuria Skin:  []Rashes   []Ulcers   []Wounds Psychological:  []History of anxiety   [] History of major depression.  Physical Examination  BP 123/86 (BP Location: Left Arm)   Pulse 71   Resp 17   Ht 5' 4" (1.626 m)   Wt 110 lb 12.8 oz (50.3 kg)   LMP  (LMP Unknown)   BMI 19.02 kg/m  Gen:  WD/WN, NAD Head: [place]/AT, No temporalis wasting. Ear/Nose/Throat: Hearing grossly intact, nares w/o  erythema or drainage Eyes: Conjunctiva clear. Sclera non-icteric Neck: Supple.  Trachea midline Pulmonary:  Good air movement, no use of accessory muscles.  Cardiac: RRR, no JVD Vascular:  Vessel Right Left  Radial Palpable Palpable                          PT 1+ Palpable 1+ Palpable  DP 2+ Palpable 1+ Palpable   Gastrointestinal: soft, non-tender/non-distended. No guarding/reflex.  Musculoskeletal: M/S 5/5 throughout.  No deformity or atrophy. Trace LLE edema. Neurologic: Sensation grossly intact in extremities.  Symmetrical.  Speech is fluent.  Psychiatric: Judgment intact, Mood & affect appropriate for pt's clinical situation. Dermatologic: No rashes or ulcers noted.  No cellulitis or open wounds.      Labs Recent Results (from the past 2160 hour(s))  Resp Panel by RT-PCR (Flu A&B, Covid) Anterior Nasal Swab     Status: None   Collection Time: 04/08/22 12:27 PM   Specimen: Anterior Nasal Swab  Result Value Ref Range   SARS Coronavirus 2 by RT PCR NEGATIVE NEGATIVE    Comment: (NOTE) SARS-CoV-2 target nucleic acids are NOT DETECTED.  The SARS-CoV-2 RNA is generally detectable in upper respiratory specimens during the acute phase of infection. The lowest concentration of SARS-CoV-2 viral copies this assay can detect is 138 copies/mL. A negative result does not preclude SARS-Cov-2 infection and should not be used as the sole basis for treatment or other patient management decisions. A negative result may occur with  improper specimen collection/handling, submission of specimen other than nasopharyngeal swab, presence of viral mutation(s) within the areas targeted by this assay, and inadequate number of viral copies(<138 copies/mL). A negative result must be combined with clinical observations, patient history, and epidemiological information. The expected result is Negative.  Fact Sheet for Patients:  EntrepreneurPulse.com.au  Fact Sheet for  Healthcare Providers:  IncredibleEmployment.be  This test is no t yet approved or cleared by the [place] and  has been authorized for detection and/or diagnosis of SARS-CoV-2 by FDA under an Emergency Use Authorization (EUA). This EUA will remain  in effect (meaning this test can be used) for the duration of the COVID-19 declaration under Section 564(b)(1) of the Act, 21 U.S.C.section 360bbb-3(b)(1), unless the authorization is terminated  or revoked sooner.       Influenza A by PCR NEGATIVE NEGATIVE   Influenza B by PCR NEGATIVE NEGATIVE    Comment: (NOTE) The Xpert Xpress SARS-CoV-2/FLU/RSV plus assay is intended as an aid in the diagnosis of influenza from Nasopharyngeal swab specimens and should not be used as a sole basis for treatment. Nasal washings and aspirates are unacceptable for Xpert Xpress SARS-CoV-2/FLU/RSV testing.  Fact Sheet for Patients: EntrepreneurPulse.com.au  Fact Sheet for Healthcare Providers: IncredibleEmployment.be  This test is not yet approved or cleared by the [place] FDA and has been authorized for detection and/or diagnosis of SARS-CoV-2 by FDA under an Emergency Use Authorization (EUA). This EUA will remain in effect (meaning this test can be used) for the duration of the COVID-19 declaration under Section 564(b)(1) of the Act, 21 U.S.C. section 360bbb-3(b)(1), unless the authorization is terminated or revoked.  Performed at [facility], [address] [place], [place] [address]   UA/M w/rflx Culture, Routine     Status: Abnormal   Collection Time: 05/25/22 10:00 AM   Specimen: Urine   Urine  Result Value Ref Range   Specific Gravity, UA 1.016 1.005 - 1.030   pH, UA 5.5 5.0 - 7.5   Color, UA Yellow Yellow   Appearance Ur Clear Clear   Leukocytes,UA 1+ (A) Negative   Protein,UA Trace Negative/Trace   Glucose, UA Negative Negative   Ketones, UA Negative  Negative   RBC, UA Negative Negative   Bilirubin, UA Negative Negative   Urobilinogen, Ur 0.2 0.2 - 1.0 mg/dL   Nitrite, UA Negative Negative   Microscopic Examination See below:     Comment: Microscopic was indicated and was performed.   Urinalysis Reflex Comment     Comment: This specimen has reflexed to a Urine Culture.  Microscopic Examination     Status: Abnormal   Collection Time: 05/25/22 10:00 AM   Urine  Result Value Ref Range   WBC, UA 6-10 (A) 0 - 5 /hpf   RBC, Urine 0-2 0 - 2 /hpf   Epithelial Cells (non renal) 0-10 0 - 10 /hpf   Casts None seen None seen /lpf   Bacteria, UA None seen None seen/Few  Urine Culture, Reflex     Status: None   Collection Time: 05/25/22 10:00 AM   Urine  Result Value Ref Range   Urine Culture, Routine Final report    Organism ID, Bacteria Comment     Comment: Culture shows less than 10,000 colony forming units of bacteria per milliliter of urine. This colony count is not generally considered to be clinically significant.   CBC with Differential/Platelet     Status: Abnormal   Collection Time: 05/28/22 11:03 AM  Result Value Ref Range   WBC 5.5 3.4 - 10.8 x10E3/uL   RBC 3.81 3.77 - 5.28 x10E6/uL   Hemoglobin 12.7 11.1 - 15.9 g/dL   Hematocrit 36.6 34.0 - 46.6 %   MCV 96 79 - 97 fL   MCH 33.3 (H) 26.6 - 33.0 pg   MCHC 34.7 31.5 - 35.7 g/dL   RDW 12.9 11.7 - 15.4 %   Platelets 151 150 - 450 x10E3/uL   Neutrophils  64 Not Estab. %   Lymphs 26 Not Estab. %   Monocytes 10 Not Estab. %   Eos 0 Not Estab. %   Basos 0 Not Estab. %   Neutrophils Absolute 3.5 1.4 - 7.0 x10E3/uL   Lymphocytes Absolute 1.5 0.7 - 3.1 x10E3/uL   Monocytes Absolute 0.6 0.1 - 0.9 x10E3/uL   EOS (ABSOLUTE) 0.0 0.0 - 0.4 x10E3/uL   Basophils Absolute 0.0 0.0 - 0.2 x10E3/uL   Immature Granulocytes 0 Not Estab. %   Immature Grans (Abs) 0.0 0.0 - 0.1 x10E3/uL  CMP14+EGFR     Status: Abnormal   Collection Time: 05/28/22 11:03 AM  Result Value Ref Range   Glucose  88 70 - 99 mg/dL   BUN 13 8 - 27 mg/dL   Creatinine, Ser 0.98 0.57 - 1.00 mg/dL   eGFR 64 >59 mL/min/1.73   BUN/Creatinine Ratio 13 12 - 28   Sodium 140 134 - 144 mmol/L   Potassium 4.2 3.5 - 5.2 mmol/L   Chloride 102 96 - 106 mmol/L   CO2 18 (L) 20 - 29 mmol/L   Calcium 10.4 (H) 8.7 - 10.3 mg/dL   Total Protein 6.9 6.0 - 8.5 g/dL   Albumin 4.7 3.9 - 4.9 g/dL   Globulin, Total 2.2 1.5 - 4.5 g/dL   Albumin/Globulin Ratio 2.1 1.2 - 2.2   Bilirubin Total 0.4 0.0 - 1.2 mg/dL   Alkaline Phosphatase 66 44 - 121 IU/L   AST 20 0 - 40 IU/L   ALT 11 0 - 32 IU/L  Vitamin D (25 hydroxy)     Status: None   Collection Time: 05/28/22 11:03 AM  Result Value Ref Range   Vit D, 25-Hydroxy 31.6 30.0 - 100.0 ng/mL    Comment: Vitamin D deficiency has been defined by the Institute of Medicine and an Endocrine Society practice guideline as a level of serum 25-OH vitamin D less than 20 ng/mL (1,2). The Endocrine Society went on to further define vitamin D insufficiency as a level between 21 and 29 ng/mL (2). 1. IOM (Institute of Medicine). 2010. Dietary reference    intakes for calcium and D. [place]: The    [employer]. 2. Holick MF, Binkley Casar, Bischoff-Ferrari HA, et al.    Evaluation, treatment, and prevention of vitamin D    deficiency: an Endocrine Society clinical practice    guideline. JCEM. 2011 Jul; 96(7):1911-30.   Lipid Profile     Status: None   Collection Time: 05/28/22 11:03 AM  Result Value Ref Range   Cholesterol, Total 149 100 - 199 mg/dL   Triglycerides 76 0 - 149 mg/dL   HDL 79 >39 mg/dL   VLDL Cholesterol Cal 15 5 - 40 mg/dL   LDL Chol Calc (NIH) 55 0 - 99 mg/dL   Chol/HDL Ratio 1.9 0.0 - 4.4 ratio    Comment:                                   T. Chol/HDL Ratio                                             Men  Women  1/2 Avg.Risk  3.4    3.3                                   Avg.Risk  5.0    4.4                                 2X Avg.Risk  9.6    7.1                                3X Avg.Risk 23.4   11.0   TSH + free T4     Status: None   Collection Time: 05/28/22 11:03 AM  Result Value Ref Range   TSH 1.010 0.450 - 4.500 uIU/mL   Free T4 1.32 0.82 - 1.77 ng/dL    Radiology No results found.  Assessment/Plan  Carotid stenosis Duplex today demonstrates 1 to 39% right ICA stenosis and 40 to 59% left ICA stenosis.  No role for intervention at that level.  On appropriate medical therapy.  Recheck in 1 year.  Essential hypertension blood pressure control important in reducing the progression of atherosclerotic disease. On appropriate oral medications.   Mixed hyperlipidemia lipid control important in reducing the progression of atherosclerotic disease. Continue statin therapy   Atherosclerosis of native arteries of extremity with intermittent claudication (HCC) ABIs today are stable on the right at 1.02 but have dropped on the left down to 0.72.  She does have more symptoms on the left side as well although they are not clearly limb threatening at this point.  Its more claudication type symptoms.  She continues to smoke and has other atherosclerotic risk factors.  She remains on aspirin and Lipitor.  I have offered her short interval follow-up versus intervention if her symptoms are disabling.  She says at current, her symptoms are not disabling and she would prefer a short interval follow-up which is reasonable.  She would benefit from smoking cessation and increasing her activity.  I will see her back in 3 months.     Leotis Pain, MD  06/19/2022 10:15 AM    This note was created with Dragon medical transcription system.  Any errors from dictation are purely unintentional

## 2022-06-19 NOTE — Assessment & Plan Note (Signed)
ABIs today are stable on the right at 1.02 but have dropped on the left down to 0.72.  She does have more symptoms on the left side as well although they are not clearly limb threatening at this point.  Its more claudication type symptoms.  She continues to smoke and has other atherosclerotic risk factors.  She remains on aspirin and Lipitor.  I have offered her short interval follow-up versus intervention if her symptoms are disabling.  She says at current, her symptoms are not disabling and she would prefer a short interval follow-up which is reasonable.  She would benefit from smoking cessation and increasing her activity.  I will see her back in 3 months.

## 2022-06-21 DIAGNOSIS — I351 Nonrheumatic aortic (valve) insufficiency: Secondary | ICD-10-CM | POA: Diagnosis not present

## 2022-06-21 DIAGNOSIS — F17200 Nicotine dependence, unspecified, uncomplicated: Secondary | ICD-10-CM | POA: Diagnosis not present

## 2022-06-21 DIAGNOSIS — I739 Peripheral vascular disease, unspecified: Secondary | ICD-10-CM | POA: Diagnosis not present

## 2022-06-21 DIAGNOSIS — I10 Essential (primary) hypertension: Secondary | ICD-10-CM | POA: Diagnosis not present

## 2022-06-21 DIAGNOSIS — I2510 Atherosclerotic heart disease of native coronary artery without angina pectoris: Secondary | ICD-10-CM | POA: Diagnosis not present

## 2022-06-21 DIAGNOSIS — I340 Nonrheumatic mitral (valve) insufficiency: Secondary | ICD-10-CM | POA: Diagnosis not present

## 2022-07-02 ENCOUNTER — Encounter (INDEPENDENT_AMBULATORY_CARE_PROVIDER_SITE_OTHER): Payer: Self-pay

## 2022-07-19 ENCOUNTER — Ambulatory Visit
Admission: RE | Admit: 2022-07-19 | Discharge: 2022-07-19 | Disposition: A | Discharge status: Home / Self Care | Payer: Medicare Other | Source: Ambulatory Visit | Attending: Nurse Practitioner | Admitting: Nurse Practitioner

## 2022-07-19 DIAGNOSIS — Z1231 Encounter for screening mammogram for malignant neoplasm of breast: Secondary | ICD-10-CM | POA: Insufficient documentation

## 2022-08-22 ENCOUNTER — Encounter: Payer: Self-pay | Admitting: Nurse Practitioner

## 2022-08-22 ENCOUNTER — Telehealth (INDEPENDENT_AMBULATORY_CARE_PROVIDER_SITE_OTHER): Payer: Medicare Other | Admitting: Nurse Practitioner

## 2022-08-22 VITALS — Ht 64.0 in | Wt 113.0 lb

## 2022-08-22 DIAGNOSIS — U071 COVID-19: Secondary | ICD-10-CM

## 2022-08-22 DIAGNOSIS — J069 Acute upper respiratory infection, unspecified: Secondary | ICD-10-CM

## 2022-08-22 MED ORDER — MOLNUPIRAVIR EUA 200MG CAPSULE: 4.0000 | ORAL_CAPSULE | Freq: Two times a day (BID) | ORAL | 0 refills | Status: AC

## 2022-08-22 MED ORDER — BENZONATATE 200 MG PO CAPS: 200.0000 mg | ORAL_CAPSULE | Freq: Two times a day (BID) | ORAL | 0 refills | Status: DC | PRN

## 2022-08-22 NOTE — Progress Notes (Signed)
[facility] [place], [place] [address]  Internal MEDICINE  Telephone Visit  Patient Name: [name]  [id number]  [id number]  Date of Service: 08/22/2022  I connected with the patient at 1515 by telephone and verified the patients identity using two identifiers.   I discussed the limitations, risks, security and privacy concerns of performing an evaluation and management service by telephone and the availability of in person appointments. I also discussed with the patient that there may be a patient responsible charge related to the service.  The patient expressed understanding and agrees to proceed.    Chief Complaint  Patient presents with   Telephone Assessment    [id number]   Telephone Screen    Covid test positive symptoms start yesterday    Sinusitis   Cough   Fever   Fatigue    HPI Marea presents for a telehealth virtual visit for covid positive respiratory infection. --onset was 1 day ago.  --reports fever, fatigue, cough and symptoms of sinusitis.    Current Medication: Outpatient Encounter Medications as of 08/22/2022  Medication Sig   albuterol (VENTOLIN HFA) 108 (90 Base) MCG/ACT inhaler Inhale 2 puffs into the lungs every 6 (six) hours as needed for wheezing or shortness of breath.   aspirin EC 81 MG EC tablet Take 1 tablet (81 mg total) by mouth daily.   atorvastatin (LIPITOR) 80 MG tablet Take 1 tablet (80 mg total) by mouth daily.   benzonatate (TESSALON) 200 MG capsule Take 1 capsule (200 mg total) by mouth 2 (two) times daily as needed for cough.   budesonide-formoterol (SYMBICORT) 80-4.5 MCG/ACT inhaler Inhale 2 puffs into the lungs daily.   butalbital-acetaminophen-caffeine (FIORICET) 50-325-40 MG tablet Take 1 tablet by mouth every 6 (six) hours as needed for headache.   chlorhexidine (PERIDEX) 0.12 % solution Use as directed 15 mLs in the mouth or throat 2 (two) times daily.   cholecalciferol (VITAMIN D) 400 units TABS  tablet Take 400 Units by mouth.   ezetimibe (ZETIA) 10 MG tablet Take 1 tablet (10 mg total) by mouth daily.   ferrous sulfate 325 (65 FE) MG tablet Take 325 mg by mouth daily.   folic acid (FOLVITE) 1 MG tablet Take 1 tablet (1 mg total) by mouth daily.   isosorbide mononitrate (IMDUR) 60 MG 24 hr tablet Take 1 tablet (60 mg total) by mouth daily.   losartan (COZAAR) 50 MG tablet Take 2 tablets (100 mg total) by mouth daily.   molnupiravir EUA (LAGEVRIO) 200 mg CAPS capsule Take 4 capsules (800 mg total) by mouth 2 (two) times daily for 5 days.   Multiple Vitamin (MULTIVITAMIN WITH MINERALS) TABS tablet Take 1 tablet by mouth daily.   REPATHA SURECLICK 704 MG/ML SOAJ Inject 140 mg into the skin every 14 (fourteen) days.   thiamine 100 MG tablet Take 1 tablet (100 mg total) by mouth daily.   No facility-administered encounter medications on file as of 08/22/2022.    Surgical History: Past Surgical History:  Procedure Laterality Date   ABDOMINAL HYSTERECTOMY     APPENDECTOMY     CARDIAC CATHETERIZATION     CARDIAC SURGERY     CHOLECYSTECTOMY     COLONOSCOPY  12/15/11   OH->bleeding internal hemorrhoids, otherwise normal   COLONOSCOPY WITH PROPOFOL N/A 04/12/2015   Procedure: COLONOSCOPY WITH PROPOFOL;  Surgeon: Lucilla Lame, MD;  Location: [facility] ENDOSCOPY;  Service: Endoscopy;  Laterality: N/A;   COLONOSCOPY WITH PROPOFOL N/A 01/05/2020   Procedure: COLONOSCOPY WITH  PROPOFOL;  Surgeon: Lucilla Lame, MD;  Location: [facility] ENDOSCOPY;  Service: Endoscopy;  Laterality: N/A;   ENDARTERECTOMY FEMORAL Left 04/26/2015   Procedure: ENDARTERECTOMY FEMORAL;  Surgeon: Algernon Huxley, MD;  Location: [facility] ORS;  Service: Vascular;  Laterality: Left;   ENDOVASCULAR STENT INSERTION Bilateral    Legs   FLEXIBLE BRONCHOSCOPY N/A 02/02/2015   Procedure: FLEXIBLE BRONCHOSCOPY;  Surgeon: Allyne Gee, MD;  Location: [facility] ORS;  Service: Pulmonary;  Laterality: N/A;   HEMORRHOID SURGERY     PERIPHERAL VASCULAR  CATHETERIZATION Left 04/25/2015   Procedure: Lower Extremity Angiography;  Surgeon: Algernon Huxley, MD;  Location: [place] [facility];  Service: Cardiovascular;  Laterality: Left;   PERIPHERAL VASCULAR CATHETERIZATION Left 04/25/2015   Procedure: Lower Extremity Intervention;  Surgeon: Algernon Huxley, MD;  Location: [place] [facility];  Service: Cardiovascular;  Laterality: Left;   PERIPHERAL VASCULAR CATHETERIZATION Left 01/02/2016   Procedure: Lower Extremity Angiography;  Surgeon: Algernon Huxley, MD;  Location: [place] [facility];  Service: Cardiovascular;  Laterality: Left;   PERIPHERAL VASCULAR CATHETERIZATION  01/02/2016   Procedure: Lower Extremity Intervention;  Surgeon: Algernon Huxley, MD;  Location: [place] [facility];  Service: Cardiovascular;;   THROMBECTOMY FEMORAL ARTERY Left 04/26/2015   Procedure: THROMBECTOMY FEMORAL ARTERY;  Surgeon: Algernon Huxley, MD;  Location: [facility] ORS;  Service: Vascular;  Laterality: Left;    Medical History: Past Medical History:  Diagnosis Date   Allergy    Environmental   Arthritis    Asthma    Carotid artery stenosis    Carotid stenosis    COPD (chronic obstructive pulmonary disease) (HCC)    Coronary artery disease    Coronary artery disease    Hemorrhoids    Hyperlipidemia    Hypertension    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(HCC)    negative cardiac cath   Nicotine addiction    Peripheral vascular disease (HCC)    Pneumonia    Shortness of breath dyspnea    Tobacco abuse     Family History: Family History  Problem Relation Age of Onset   Hypertension Mother    Hypertension Father     Social History   Socioeconomic History   Marital status: Single    Spouse name: Not on file   Number of children: Not on file   Years of education: Not on file   Highest education level: Not on file  Occupational History   Not on file  Tobacco Use   Smoking status: Every Day    Packs/day: 1.00    Years: 41.00    Total pack years: 41.00    Types: Cigarettes    Smokeless tobacco: Never  Vaping Use   Vaping Use: Never used  Substance and Sexual Activity   Alcohol use: Yes    Alcohol/week: 0.0 standard drinks of alcohol    Comment: weekends, couple beers, pint of liquer only on weekends.  Last drank approx 1 month ago.   Drug use: No   Sexual activity: Not Currently  Other Topics Concern   Not on file  Social History Narrative   Lives with family    Social Determinants of Health   Financial Resource Strain: Low Risk  (07/20/2021)   Overall Financial Resource Strain (CARDIA)    Difficulty of Paying Living Expenses: Not very hard  Food Insecurity: No Food Insecurity (04/04/2018)   Hunger Vital Sign    Worried About Running Out of Food in the Last Year: Never true  Ran Out of Food in the Last Year: Never true  Transportation Needs: No Transportation Needs (04/04/2018)   PRAPARE - Hydrologist (Medical): No    Lack of Transportation (Non-Medical): No  Physical Activity: Inactive (04/04/2018)   Exercise Vital Sign    Days of Exercise per Week: 0 days    Minutes of Exercise per Session: 0 min  Stress: No Stress Concern Present (04/04/2018)   [place]    Feeling of Stress : Only a little  Social Connections: Moderately Isolated (04/04/2018)   Social Connection and Isolation Panel [NHANES]    Frequency of Communication with Friends and Family: Once a week    Frequency of Social Gatherings with Friends and Family: Once a week    Attends Religious Services: 1 to 4 times per year    Active Member of [employer] or Organizations: No    Attends Archivist Meetings: Never    Marital Status: Never married  Intimate Partner Violence: Not At Risk (04/04/2018)   Humiliation, Afraid, Rape, and Kick questionnaire    Fear of Current or Ex-Partner: No    Emotionally Abused: No    Physically Abused: No    Sexually Abused: No      Review of Systems   Constitutional:  Positive for appetite change, fatigue and fever. Negative for chills.  HENT:  Positive for congestion, postnasal drip, rhinorrhea, sinus pressure and sinus pain. Negative for ear pain, sneezing and sore throat.   Respiratory:  Positive for cough. Negative for chest tightness, shortness of breath and wheezing.   Cardiovascular: Negative.  Negative for chest pain and palpitations.  Gastrointestinal: Negative.   Neurological:  Negative for dizziness and headaches.    Vital Signs: Ht '5\' 4"'$  (1.626 m)   Wt 113 lb (51.3 kg)   LMP  (LMP Unknown)   BMI 19.40 kg/m    Observation/Objective: She is alert and oriented and engages in conversation appropriately. She does not sound as though she is in any acute distress over telephone call.     Assessment/Plan: 1. Upper respiratory tract infection due to COVID-19 virus Antiviral and prn cough suppressant prescribed.  - molnupiravir EUA (LAGEVRIO) 200 mg CAPS capsule; Take 4 capsules (800 mg total) by mouth 2 (two) times daily for 5 days.  Dispense: 40 capsule; Refill: 0 - benzonatate (TESSALON) 200 MG capsule; Take 1 capsule (200 mg total) by mouth 2 (two) times daily as needed for cough.  Dispense: 20 capsule; Refill: 0   General Counseling: Anijah verbalizes understanding of the findings of today's phone visit and agrees with plan of treatment. I have discussed any further diagnostic evaluation that may be needed or ordered today. We also reviewed her medications today. she has been encouraged to call the office with any questions or concerns that should arise related to todays visit.  Return if symptoms worsen or fail to improve.   No orders of the defined types were placed in this encounter.   Meds ordered this encounter  Medications   molnupiravir EUA (LAGEVRIO) 200 mg CAPS capsule    Sig: Take 4 capsules (800 mg total) by mouth 2 (two) times daily for 5 days.    Dispense:  40 capsule    Refill:  0   benzonatate  (TESSALON) 200 MG capsule    Sig: Take 1 capsule (200 mg total) by mouth 2 (two) times daily as needed for cough.    Dispense:  20 capsule    Refill:  0    Time spent:10 Minutes Time spent with patient included reviewing progress notes, labs, imaging studies, and discussing plan for follow up.  [place] Controlled Substance Database was reviewed by me for overdose risk score (ORS) if appropriate.  This patient was seen by Jonetta Osgood, FNP-C in collaboration with Dr. Clayborn Bigness as a part of collaborative care agreement.  Jese Comella R. Valetta Fuller, MSN, FNP-C Internal medicine

## 2022-08-25 ENCOUNTER — Other Ambulatory Visit: Payer: Self-pay | Admitting: Nurse Practitioner

## 2022-08-25 DIAGNOSIS — J449 Chronic obstructive pulmonary disease, unspecified: Secondary | ICD-10-CM

## 2022-09-18 ENCOUNTER — Encounter (INDEPENDENT_AMBULATORY_CARE_PROVIDER_SITE_OTHER): Payer: Self-pay | Admitting: Vascular Surgery

## 2022-09-18 ENCOUNTER — Ambulatory Visit (INDEPENDENT_AMBULATORY_CARE_PROVIDER_SITE_OTHER): Payer: 59

## 2022-09-18 ENCOUNTER — Ambulatory Visit (INDEPENDENT_AMBULATORY_CARE_PROVIDER_SITE_OTHER): Payer: 59 | Admitting: Vascular Surgery

## 2022-09-18 VITALS — BP 204/92 | HR 71 | Resp 15 | Wt 112.6 lb

## 2022-09-18 DIAGNOSIS — I70213 Atherosclerosis of native arteries of extremities with intermittent claudication, bilateral legs: Secondary | ICD-10-CM

## 2022-09-18 DIAGNOSIS — I10 Essential (primary) hypertension: Secondary | ICD-10-CM

## 2022-09-18 DIAGNOSIS — I6523 Occlusion and stenosis of bilateral carotid arteries: Secondary | ICD-10-CM | POA: Diagnosis not present

## 2022-09-18 DIAGNOSIS — E782 Mixed hyperlipidemia: Secondary | ICD-10-CM

## 2022-09-18 LAB — VAS US ABI WITH/WO TBI
Left ABI: 0.73
Right ABI: 0.97

## 2022-09-18 NOTE — Assessment & Plan Note (Signed)
ABIs today are stable at 0.97 on the right and 0.73 on the left. Duplex suggests largely tibial level disease with patent SFA/popliteal stents.  I had a long discussion with the patient today and she desires not to have intervention at this point and continue with claudication symptoms only.  She does not have rest pain.  She does not have ulceration.  As long she has nonlimb threatening symptoms, this is a perfectly reasonable option.  I will plan to see her back in 6 months or sooner if problems develop in the interim.

## 2022-09-18 NOTE — Progress Notes (Signed)
MRN : [id number]  [name] is a 67 y.o. (1956/01/19) female who presents with chief complaint of  Chief Complaint  Patient presents with   Follow-up    Ultrasound follow up  .  History of Present Illness: Patient returns today in follow up of [name] PAD.  She still having left leg claudication symptoms.  No rest pain.  No ulceration.  No major changes from [name] visit a few months ago. ABIs today are stable at 0.97 on the right and 0.73 on the left. Duplex suggests largely tibial level disease with patent SFA/popliteal stents.   Current Outpatient Medications  Medication Sig Dispense Refill   albuterol (VENTOLIN HFA) 108 (90 Base) MCG/ACT inhaler Inhale 2 puffs into the lungs every 6 (six) hours as needed for wheezing or shortness of breath. 18 g 3   aspirin EC 81 MG EC tablet Take 1 tablet (81 mg total) by mouth daily. 90 tablet 1   atorvastatin (LIPITOR) 80 MG tablet Take 1 tablet (80 mg total) by mouth daily. 90 tablet 3   benzonatate (TESSALON) 200 MG capsule Take 1 capsule (200 mg total) by mouth 2 (two) times daily as needed for cough. 20 capsule 0   budesonide-formoterol (SYMBICORT) 80-4.5 MCG/ACT inhaler INHALE 2 PUFFS BY MOUTH ONCE DAILY 22 g 0   butalbital-acetaminophen-caffeine (FIORICET) 50-325-40 MG tablet Take 1 tablet by mouth every 6 (six) hours as needed for headache. 14 tablet 4   chlorhexidine (PERIDEX) 0.12 % solution Use as directed 15 mLs in the mouth or throat 2 (two) times daily. 473 mL 0   cholecalciferol (VITAMIN D) 400 units TABS tablet Take 400 Units by mouth.     ezetimibe (ZETIA) 10 MG tablet Take 1 tablet (10 mg total) by mouth daily. 90 tablet 3   ferrous sulfate 325 (65 FE) MG tablet Take 325 mg by mouth daily.     folic acid (FOLVITE) 1 MG tablet Take 1 tablet (1 mg total) by mouth daily. 90 tablet 1   isosorbide mononitrate (IMDUR) 60 MG 24 hr tablet Take 1 tablet (60 mg total) by mouth daily. 90 tablet 0   losartan (COZAAR) 50 MG tablet Take 2 tablets  (100 mg total) by mouth daily. 180 tablet 3   Multiple Vitamin (MULTIVITAMIN WITH MINERALS) TABS tablet Take 1 tablet by mouth daily.     REPATHA SURECLICK 867 MG/ML SOAJ Inject 140 mg into the skin every 14 (fourteen) days.     thiamine 100 MG tablet Take 1 tablet (100 mg total) by mouth daily. 90 tablet 0   No current facility-administered medications for this visit.    Past Medical History:  Diagnosis Date   Allergy    Environmental   Arthritis    Asthma    Carotid artery stenosis    Carotid stenosis    COPD (chronic obstructive pulmonary disease) (HCC)    Coronary artery disease    Coronary artery disease    Hemorrhoids    Hyperlipidemia    Hypertension    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(HCC)    negative cardiac cath   Nicotine addiction    Peripheral vascular disease (HCC)    Pneumonia    Shortness of breath dyspnea    Tobacco abuse     Past Surgical History:  Procedure Laterality Date   ABDOMINAL HYSTERECTOMY     APPENDECTOMY     CARDIAC CATHETERIZATION     CARDIAC SURGERY     CHOLECYSTECTOMY     COLONOSCOPY  12/15/11  OH->bleeding internal hemorrhoids, otherwise normal   COLONOSCOPY WITH PROPOFOL N/A 04/12/2015   Procedure: COLONOSCOPY WITH PROPOFOL;  Surgeon: Lucilla Lame, MD;  Location: [facility] ENDOSCOPY;  Service: Endoscopy;  Laterality: N/A;   COLONOSCOPY WITH PROPOFOL N/A 01/05/2020   Procedure: COLONOSCOPY WITH PROPOFOL;  Surgeon: Lucilla Lame, MD;  Location: [facility] ENDOSCOPY;  Service: Endoscopy;  Laterality: N/A;   ENDARTERECTOMY FEMORAL Left 04/26/2015   Procedure: ENDARTERECTOMY FEMORAL;  Surgeon: Algernon Huxley, MD;  Location: [facility] ORS;  Service: Vascular;  Laterality: Left;   ENDOVASCULAR STENT INSERTION Bilateral    Legs   FLEXIBLE BRONCHOSCOPY N/A 02/02/2015   Procedure: FLEXIBLE BRONCHOSCOPY;  Surgeon: Allyne Gee, MD;  Location: [facility] ORS;  Service: Pulmonary;  Laterality: N/A;   HEMORRHOID SURGERY     PERIPHERAL VASCULAR CATHETERIZATION Left 04/25/2015   Procedure:  Lower Extremity Angiography;  Surgeon: Algernon Huxley, MD;  Location: [place] [facility];  Service: Cardiovascular;  Laterality: Left;   PERIPHERAL VASCULAR CATHETERIZATION Left 04/25/2015   Procedure: Lower Extremity Intervention;  Surgeon: Algernon Huxley, MD;  Location: [place] [facility];  Service: Cardiovascular;  Laterality: Left;   PERIPHERAL VASCULAR CATHETERIZATION Left 01/02/2016   Procedure: Lower Extremity Angiography;  Surgeon: Algernon Huxley, MD;  Location: [place] [facility];  Service: Cardiovascular;  Laterality: Left;   PERIPHERAL VASCULAR CATHETERIZATION  01/02/2016   Procedure: Lower Extremity Intervention;  Surgeon: Algernon Huxley, MD;  Location: [place] [facility];  Service: Cardiovascular;;   THROMBECTOMY FEMORAL ARTERY Left 04/26/2015   Procedure: THROMBECTOMY FEMORAL ARTERY;  Surgeon: Algernon Huxley, MD;  Location: [facility] ORS;  Service: Vascular;  Laterality: Left;     Social History   Tobacco Use   Smoking status: Every Day    Packs/day: 1.00    Years: 41.00    Total pack years: 41.00    Types: Cigarettes   Smokeless tobacco: Never  Vaping Use   Vaping Use: Never used  Substance Use Topics   Alcohol use: Yes    Alcohol/week: 0.0 standard drinks of alcohol    Comment: weekends, couple beers, pint of liquer only on weekends.  Last drank approx 1 month ago.   Drug use: No       Family History  Problem Relation Age of Onset   Hypertension Mother    Hypertension Father      Allergies  Allergen Reactions   Aspirin Nausea And Vomiting and Other (See Comments)    Pt states aspirin makes [name] cramp and have to use coated kind.   Tramadol    Zyrtec [Cetirizine]     REVIEW OF SYSTEMS (Negative unless checked)   Constitutional: '[]'$ Weight loss  '[]'$ Fever  '[]'$ Chills Cardiac: '[]'$ Chest pain   '[]'$ Chest pressure   '[]'$ Palpitations   '[]'$ Shortness of breath when laying flat   '[]'$ Shortness of breath at rest   '[x]'$ Shortness of breath with exertion. Vascular:  '[x]'$ Pain in legs with walking    '[]'$ Pain in legs at rest   '[]'$ Pain in legs when laying flat   '[]'$ Claudication   '[]'$ Pain in feet when walking  '[x]'$ Pain in feet at rest  '[]'$ Pain in feet when laying flat   '[]'$ History of DVT   '[]'$ Phlebitis   '[]'$ Swelling in legs   '[]'$ Varicose veins   '[]'$ Non-healing ulcers Pulmonary:   '[]'$ Uses home oxygen   '[]'$ Productive cough   '[]'$ Hemoptysis   '[]'$ Wheeze  '[x]'$ COPD   '[x]'$ Asthma Neurologic:  '[]'$ Dizziness  '[]'$ Blackouts   '[]'$ Seizures   '[]'$ History of stroke   '[]'$ History of TIA  '[]'$   Aphasia   '[]'$ Temporary blindness   '[]'$ Dysphagia   '[]'$ Weakness or numbness in arms   '[]'$ Weakness or numbness in legs Musculoskeletal:  '[x]'$ Arthritis   '[]'$ Joint swelling   '[x]'$ Joint pain   '[]'$ Low back pain Hematologic:  '[]'$ Easy bruising  '[]'$ Easy bleeding   '[]'$ Hypercoagulable state   '[]'$ Anemic   Gastrointestinal:  '[]'$ Blood in stool   '[]'$ Vomiting blood  '[]'$ Gastroesophageal reflux/heartburn   '[]'$ Abdominal pain Genitourinary:  '[]'$ Chronic kidney disease   '[]'$ Difficult urination  '[]'$ Frequent urination  '[]'$ Burning with urination   '[]'$ Hematuria Skin:  '[]'$ Rashes   '[]'$ Ulcers   '[]'$ Wounds Psychological:  '[]'$ History of anxiety   '[]'$  History of major depression.   Physical Examination  BP (!) 204/92 (BP Location: Right Arm)   Pulse 71   Resp 15   Wt 112 lb 9.6 oz (51.1 kg)   LMP  (LMP Unknown)   BMI 19.33 kg/m  Gen:   NAD, thin Head: [place]/AT, + temporalis wasting. Ear/Nose/Throat: Hearing grossly intact, nares w/o erythema or drainage Eyes: Conjunctiva clear. Sclera non-icteric Neck: Supple.  Trachea midline Pulmonary:  Good air movement, no use of accessory muscles.  Cardiac: RRR, no JVD Vascular:  Vessel Right Left  Radial Palpable Palpable                          PT 2+ Palpable 1+ Palpable  DP 1+ Palpable 1+ Palpable   Gastrointestinal: soft, non-tender/non-distended. No guarding/reflex.  Musculoskeletal: M/S 5/5 throughout.  No deformity or atrophy. No edema. Neurologic: Sensation grossly intact in extremities.  Symmetrical.  Speech is fluent.  Psychiatric: Judgment  intact, Mood & affect appropriate for pt's clinical situation. Dermatologic: No rashes or ulcers noted.  No cellulitis or open wounds.      Labs No results found for this or any previous visit (from the past 2160 hour(s)).  Radiology No results found.  Assessment/Plan Carotid stenosis Duplex three months ago demonstrates 1 to 39% right ICA stenosis and 40 to 59% left ICA stenosis.  No role for intervention at that level.  On appropriate medical therapy.  Recheck in 1 year.   Essential hypertension blood pressure control important in reducing the progression of atherosclerotic disease. On appropriate oral medications.     Mixed hyperlipidemia lipid control important in reducing the progression of atherosclerotic disease. Continue statin therapy  Atherosclerosis of native arteries of extremity with intermittent claudication (HCC) ABIs today are stable at 0.97 on the right and 0.73 on the left. Duplex suggests largely tibial level disease with patent SFA/popliteal stents.  I had a long discussion with the patient today and she desires not to have intervention at this point and continue with claudication symptoms only.  She does not have rest pain.  She does not have ulceration.  As long she has nonlimb threatening symptoms, this is a perfectly reasonable option.  I will plan to see [name] back in 6 months or sooner if problems develop in the interim.    Leotis Pain, MD  09/18/2022 12:21 PM    This note was created with Dragon medical transcription system.  Any errors from dictation are purely unintentional

## 2022-10-08 ENCOUNTER — Other Ambulatory Visit: Payer: Self-pay | Admitting: Cardiovascular Disease

## 2022-10-08 DIAGNOSIS — I340 Nonrheumatic mitral (valve) insufficiency: Secondary | ICD-10-CM

## 2022-10-15 ENCOUNTER — Ambulatory Visit: Payer: 59 | Admitting: Cardiovascular Disease

## 2022-10-15 DIAGNOSIS — I340 Nonrheumatic mitral (valve) insufficiency: Secondary | ICD-10-CM | POA: Diagnosis not present

## 2022-10-22 ENCOUNTER — Ambulatory Visit (INDEPENDENT_AMBULATORY_CARE_PROVIDER_SITE_OTHER): Payer: 59 | Admitting: Cardiovascular Disease

## 2022-10-22 ENCOUNTER — Encounter: Payer: Self-pay | Admitting: Cardiovascular Disease

## 2022-10-22 VITALS — BP 110/70 | HR 104 | Ht 64.0 in | Wt 111.8 lb

## 2022-10-22 DIAGNOSIS — F17200 Nicotine dependence, unspecified, uncomplicated: Secondary | ICD-10-CM

## 2022-10-22 DIAGNOSIS — E782 Mixed hyperlipidemia: Secondary | ICD-10-CM | POA: Diagnosis not present

## 2022-10-22 DIAGNOSIS — I70213 Atherosclerosis of native arteries of extremities with intermittent claudication, bilateral legs: Secondary | ICD-10-CM

## 2022-10-22 DIAGNOSIS — I10 Essential (primary) hypertension: Secondary | ICD-10-CM | POA: Diagnosis not present

## 2022-10-22 DIAGNOSIS — R000 Tachycardia, unspecified: Secondary | ICD-10-CM

## 2022-10-22 DIAGNOSIS — I2510 Atherosclerotic heart disease of native coronary artery without angina pectoris: Secondary | ICD-10-CM | POA: Diagnosis not present

## 2022-10-22 DIAGNOSIS — F17210 Nicotine dependence, cigarettes, uncomplicated: Secondary | ICD-10-CM | POA: Diagnosis not present

## 2022-10-22 DIAGNOSIS — I6523 Occlusion and stenosis of bilateral carotid arteries: Secondary | ICD-10-CM | POA: Diagnosis not present

## 2022-10-22 DIAGNOSIS — I2583 Coronary atherosclerosis due to lipid rich plaque: Secondary | ICD-10-CM | POA: Diagnosis not present

## 2022-10-22 MED ORDER — METOPROLOL SUCCINATE ER 50 MG PO TB24: 50.0000 mg | ORAL_TABLET | Freq: Every day | ORAL | 3 refills | Status: DC

## 2022-10-22 NOTE — Assessment & Plan Note (Signed)
Add metoprolol.

## 2022-10-22 NOTE — Assessment & Plan Note (Signed)
Well controlled 

## 2022-10-22 NOTE — Assessment & Plan Note (Addendum)
Followed by Dr Dew.   

## 2022-10-22 NOTE — Assessment & Plan Note (Signed)
Denies chest pain, shortness of breath. CCTA 06/2021 score 2448. Schedule stress test.

## 2022-10-22 NOTE — Assessment & Plan Note (Signed)
Discussed importance of smoking cessation. Patient not interested in quitting.

## 2022-10-22 NOTE — Progress Notes (Signed)
Cardiology Office Note   Date:  10/22/2022   ID:  [name], DOB 1956-08-12, MRN [id number]  PCP:  Jonetta Osgood, NP  Cardiologist:  Neoma Laming, MD      History of Present Illness: [name] is a 67 y.o. female who presents for  Chief Complaint  Patient presents with   Follow-up    4 month fu and echo    Patient in office for routine cardiac exam, discuss echo results. Denies chest pain, shortness of breath, edema, palpitations.       Past Medical History:  Diagnosis Date   Allergy    Environmental   Arthritis    Asthma    Carotid artery stenosis    Carotid stenosis    COPD (chronic obstructive pulmonary disease) (HCC)    Coronary artery disease    Coronary artery disease    Hemorrhoids    Hyperlipidemia    Hypertension    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(HCC)    negative cardiac cath   Nicotine addiction    Peripheral vascular disease (HCC)    Pneumonia    Shortness of breath dyspnea    Tobacco abuse      Past Surgical History:  Procedure Laterality Date   ABDOMINAL HYSTERECTOMY     APPENDECTOMY     CARDIAC CATHETERIZATION     CARDIAC SURGERY     CHOLECYSTECTOMY     COLONOSCOPY  12/15/11   OH->bleeding internal hemorrhoids, otherwise normal   COLONOSCOPY WITH PROPOFOL N/A 04/12/2015   Procedure: COLONOSCOPY WITH PROPOFOL;  Surgeon: Lucilla Lame, MD;  Location: [facility] ENDOSCOPY;  Service: Endoscopy;  Laterality: N/A;   COLONOSCOPY WITH PROPOFOL N/A 01/05/2020   Procedure: COLONOSCOPY WITH PROPOFOL;  Surgeon: Lucilla Lame, MD;  Location: [facility] ENDOSCOPY;  Service: Endoscopy;  Laterality: N/A;   ENDARTERECTOMY FEMORAL Left 04/26/2015   Procedure: ENDARTERECTOMY FEMORAL;  Surgeon: Algernon Huxley, MD;  Location: [facility] ORS;  Service: Vascular;  Laterality: Left;   ENDOVASCULAR STENT INSERTION Bilateral    Legs   FLEXIBLE BRONCHOSCOPY N/A 02/02/2015   Procedure: FLEXIBLE BRONCHOSCOPY;  Surgeon: Allyne Gee, MD;  Location: [facility] ORS;  Service: Pulmonary;  Laterality:  N/A;   HEMORRHOID SURGERY     PERIPHERAL VASCULAR CATHETERIZATION Left 04/25/2015   Procedure: Lower Extremity Angiography;  Surgeon: Algernon Huxley, MD;  Location: [place] [facility];  Service: Cardiovascular;  Laterality: Left;   PERIPHERAL VASCULAR CATHETERIZATION Left 04/25/2015   Procedure: Lower Extremity Intervention;  Surgeon: Algernon Huxley, MD;  Location: [place] [facility];  Service: Cardiovascular;  Laterality: Left;   PERIPHERAL VASCULAR CATHETERIZATION Left 01/02/2016   Procedure: Lower Extremity Angiography;  Surgeon: Algernon Huxley, MD;  Location: [place] [facility];  Service: Cardiovascular;  Laterality: Left;   PERIPHERAL VASCULAR CATHETERIZATION  01/02/2016   Procedure: Lower Extremity Intervention;  Surgeon: Algernon Huxley, MD;  Location: [place] [facility];  Service: Cardiovascular;;   THROMBECTOMY FEMORAL ARTERY Left 04/26/2015   Procedure: THROMBECTOMY FEMORAL ARTERY;  Surgeon: Algernon Huxley, MD;  Location: [facility] ORS;  Service: Vascular;  Laterality: Left;     Current Outpatient Medications  Medication Sig Dispense Refill   albuterol (VENTOLIN HFA) 108 (90 Base) MCG/ACT inhaler Inhale 2 puffs into the lungs every 6 (six) hours as needed for wheezing or shortness of breath. 18 g 3   aspirin EC 81 MG EC tablet Take 1 tablet (81 mg total) by mouth daily. 90 tablet 1   atorvastatin (LIPITOR) 80 MG tablet Take 1  tablet (80 mg total) by mouth daily. 90 tablet 3   budesonide-formoterol (SYMBICORT) 80-4.5 MCG/ACT inhaler INHALE 2 PUFFS BY MOUTH ONCE DAILY 22 g 0   butalbital-acetaminophen-caffeine (FIORICET) 50-325-40 MG tablet Take 1 tablet by mouth every 6 (six) hours as needed for headache. 14 tablet 4   cholecalciferol (VITAMIN D) 400 units TABS tablet Take 400 Units by mouth.     ezetimibe (ZETIA) 10 MG tablet Take 1 tablet (10 mg total) by mouth daily. 90 tablet 3   ferrous sulfate 325 (65 FE) MG tablet Take 325 mg by mouth daily.     folic acid (FOLVITE) 1 MG tablet Take 1 tablet (1  mg total) by mouth daily. 90 tablet 1   isosorbide mononitrate (IMDUR) 60 MG 24 hr tablet Take 1 tablet (60 mg total) by mouth daily. 90 tablet 0   losartan (COZAAR) 50 MG tablet Take 2 tablets (100 mg total) by mouth daily. 180 tablet 3   metoprolol succinate (TOPROL XL) 50 MG 24 hr tablet Take 1 tablet (50 mg total) by mouth daily. Take with or immediately following a meal. 30 tablet 3   REPATHA SURECLICK XX123456 MG/ML SOAJ Inject 140 mg into the skin every 14 (fourteen) days.     No current facility-administered medications for this visit.    Allergies:   Aspirin, Tramadol, and Zyrtec [cetirizine]    Social History:   reports that she has been smoking cigarettes. She has a 41.00 pack-year smoking history. She has never used smokeless tobacco. She reports current alcohol use. She reports that she does not use drugs.   Family History:  family history includes Hypertension in her father and mother.    ROS:     Review of Systems  Constitutional: Negative.   HENT: Negative.    Eyes: Negative.   Respiratory: Negative.    Gastrointestinal: Negative.   Genitourinary: Negative.   Musculoskeletal: Negative.   Skin: Negative.   Neurological: Negative.   Endo/Heme/Allergies: Negative.   Psychiatric/Behavioral: Negative.    All other systems reviewed and are negative.     All other systems are reviewed and negative.    PHYSICAL EXAM: VS:  BP 110/70   Pulse (!) 104   Ht 5' 4"$  (1.626 m)   Wt 111 lb 12.8 oz (50.7 kg)   LMP  (LMP Unknown)   SpO2 96%   BMI 19.19 kg/m  , BMI Body mass index is 19.19 kg/m. Last weight:  Wt Readings from Last 3 Encounters:  10/22/22 111 lb 12.8 oz (50.7 kg)  09/18/22 112 lb 9.6 oz (51.1 kg)  08/22/22 113 lb (51.3 kg)     Physical Exam Constitutional:      Appearance: Normal appearance.  Cardiovascular:     Rate and Rhythm: Normal rate and regular rhythm.     Heart sounds: Normal heart sounds.  Pulmonary:     Effort: Pulmonary effort is normal.      Breath sounds: Normal breath sounds.  Musculoskeletal:     Right lower leg: No edema.     Left lower leg: No edema.  Neurological:     Mental Status: She is alert.     EKG: none today  Recent Labs: 05/28/2022: ALT 11; BUN 13; Creatinine, Ser 0.98; Hemoglobin 12.7; Platelets 151; Potassium 4.2; Sodium 140; TSH 1.010    Lipid Panel    Component Value Date/Time   CHOL 149 05/28/2022 1103   CHOL 147 10/16/2013 0439   TRIG 76 05/28/2022 1103   TRIG 122  10/16/2013 0439   HDL 79 05/28/2022 1103   HDL 31 (L) 10/16/2013 0439   CHOLHDL 1.9 05/28/2022 1103   CHOLHDL 1.4 08/06/2021 0539   VLDL 9 08/06/2021 0539   VLDL 24 10/16/2013 0439   LDLCALC 55 05/28/2022 1103   LDLCALC 92 10/16/2013 0439      Other studies Reviewed:  REASON FOR VISIT  Referred by Dr.Babak Lucus Humphrey Rolls.        TESTS  Imaging: Computed Tomographic Angiography:  Cardiac multidetector CT was performed paying particular attention to the coronary arteries for the diagnosis of: CAD. Diagnostic Drugs:  Administered iohexol (Omnipaque) through an antecubital vein and images from the examination were analyzed for the presence and extent of coronary artery disease, using 3D image processing software. 100 mL of non-ionic contrast (Omnipaque) was used.        TEST CONCLUSIONS  Quality of study: Fair.  1-Calcium score: 2448.0  2-Left dominant system.  3-60% mid LAD disease with severe calcification. LCX mild to moderate diffuse disease with severe calcification. Small RCA. Consider Cath if continures to have chest pain.    Neoma Laming MD  Electronically signed by: Neoma Laming     Date: 06/19/2021 10:04   TESTS    [facility] [address] [place],  [address] [phone] STUDY:  Gated Stress / Rest Myocardial Perfusion Imaging Tomographic (SPECT) Including attenuation correction Wall Motion, Left Ventricular Ejection Fraction By Gated Technique.Persantine Stress Test. SEX:  Female  WEIGHT: 112 lbs   HEIGHT: 64 in     ARMS UP: YES/NO                                                                                                                                                                                REFERRING PHYSICIAN: Dr.Jozlin Bently Humphrey Rolls  INDICATION FOR STUDY: CP                                                                                                                                                                                                                    TECHNIQUE:  Approximately 20 minutes following the intravenous administration of 10.1 mCi of Tc-80mSestamibi after stress testing in a reclined supine position with arms above their head if able to do so, gated SPECT imaging of the heart was performed. After about a 2hr break, the patient was injected intravenously with 31.7 mCi of Tc-92mestamibi.  Approximately 45 minutes later in the same position as stress imaging SPECT rest imaging of the heart was performed.  STRESS BY:  ShNeoma LamingMD PROTOCOL:  Persantine    DOSE ADMIN: 5.7 cc    ROUTE OF ADMINISTRATION: IV                                                                            MAX PRED HR: 155                     85%: 132               75%: 116                                                                                                                   RESTING BP: 112/72   RESTING HR: 63  PEAK BP: 108/70   PEAK HR: 75  EXERCISE DURATION:    4 min injection                                            REASON FOR TEST TERMINATION:    Protocol end                                                                                                                               SYMPTOMS:   None                                                                                                                                                                                                          EKG RESULTS: NSR. 65/min. LVH. No significant ST changes with persantine.                                                              IMAGE QUALITY: Good  PERFUSION/WALL MOTION FINDINGS: EF = 55%. Moderate size and intensity reversible basal inferior, mid inferior, apical inferior wall defects, normal wall motion.                                                                           IMPRESSION: Ischemia in the RCA territory with normal LVEF, advise CCTA.                                                                                                                                                                                                                                                                                         Neoma Laming, MD Stress Interpreting Physician / Nuclear Interpreting Physician        Neoma Laming MD  Electronically signed by: Neoma Laming     Date: 05/25/2021 11:58  ASSESSMENT AND PLAN:    ICD-10-CM   1. Atherosclerosis of native artery of both lower extremities with intermittent claudication ([place])  I70.213     2. Coronary artery disease due to lipid rich plaque  I25.10 MYOCARDIAL PERFUSION IMAGING   I25.83     3. Essential hypertension  I10     4. Mixed hyperlipidemia  E78.2     5. Tobacco use disorder  F17.200     6. Sinus tachycardia  R00.0 metoprolol succinate (TOPROL XL) 50 MG 24 hr tablet    7. Bilateral carotid  artery stenosis  I65.23        Problem List Items Addressed This Visit       Cardiovascular and Mediastinum   Essential hypertension    Well controlled.      Relevant Medications   metoprolol succinate (TOPROL XL) 50 MG 24 hr tablet   Atherosclerosis of native arteries of extremity with intermittent claudication (HCC) - Primary    Followed by Dr. Lucky Cowboy.      Relevant Medications  metoprolol succinate (TOPROL XL) 50 MG 24 hr tablet   Carotid stenosis   Relevant Medications   metoprolol succinate (TOPROL XL) 50 MG 24 hr tablet   Coronary artery disease due to lipid rich plaque    Denies chest pain, shortness of breath. CCTA 06/2021 score 2448. Schedule stress test.       Relevant Medications   metoprolol succinate (TOPROL XL) 50 MG 24 hr tablet   Other Relevant Orders   MYOCARDIAL PERFUSION IMAGING     Other   Tobacco use disorder    Discussed importance of smoking cessation. Patient not interested in quitting.       Mixed hyperlipidemia   Relevant Medications   metoprolol succinate (TOPROL XL) 50 MG 24 hr tablet   Sinus tachycardia    Add metoprolol.      Relevant Medications   metoprolol succinate (TOPROL XL) 50 MG 24 hr tablet       Disposition:   Return in about 4 weeks (around 11/19/2022) for after stress test.    Total time spent: 30 minutes  Signed,  Neoma Laming, MD  10/22/2022 11:34 AM    [place]

## 2022-11-15 ENCOUNTER — Ambulatory Visit (INDEPENDENT_AMBULATORY_CARE_PROVIDER_SITE_OTHER): Payer: 59

## 2022-11-15 DIAGNOSIS — I2510 Atherosclerotic heart disease of native coronary artery without angina pectoris: Secondary | ICD-10-CM

## 2022-11-15 MED ORDER — TECHNETIUM TC 99M SESTAMIBI GENERIC - CARDIOLITE
9.8000 | Freq: Once | INTRAVENOUS | Status: AC | PRN
  Administered 2022-11-15: 9.8 via INTRAVENOUS

## 2022-11-15 MED ORDER — TECHNETIUM TC 99M SESTAMIBI GENERIC - CARDIOLITE
31.8000 | Freq: Once | INTRAVENOUS | Status: AC | PRN
  Administered 2022-11-15: 31.8 via INTRAVENOUS

## 2022-11-20 ENCOUNTER — Ambulatory Visit (INDEPENDENT_AMBULATORY_CARE_PROVIDER_SITE_OTHER): Payer: 59 | Admitting: Cardiovascular Disease

## 2022-11-20 ENCOUNTER — Encounter: Payer: Self-pay | Admitting: Cardiovascular Disease

## 2022-11-20 VITALS — BP 96/64 | HR 93 | Ht 64.0 in | Wt 113.0 lb

## 2022-11-20 DIAGNOSIS — Z8639 Personal history of other endocrine, nutritional and metabolic disease: Secondary | ICD-10-CM | POA: Diagnosis not present

## 2022-11-20 DIAGNOSIS — I10 Essential (primary) hypertension: Secondary | ICD-10-CM

## 2022-11-20 DIAGNOSIS — I2510 Atherosclerotic heart disease of native coronary artery without angina pectoris: Secondary | ICD-10-CM | POA: Diagnosis not present

## 2022-11-20 DIAGNOSIS — I70211 Atherosclerosis of native arteries of extremities with intermittent claudication, right leg: Secondary | ICD-10-CM

## 2022-11-20 DIAGNOSIS — I6529 Occlusion and stenosis of unspecified carotid artery: Secondary | ICD-10-CM

## 2022-11-20 DIAGNOSIS — F17219 Nicotine dependence, cigarettes, with unspecified nicotine-induced disorders: Secondary | ICD-10-CM

## 2022-11-20 NOTE — Progress Notes (Signed)
Cardiology Office Note   Date:  11/20/2022   ID:  [name], DOB 04-12-1956, MRN [id number]  PCP:  Jonetta Osgood, NP  Cardiologist:  Neoma Laming, MD      History of Present Illness: [name] is a 67 y.o. female who presents for  Chief Complaint  Patient presents with   Follow-up    NST results    No chest pain and SOB      Past Medical History:  Diagnosis Date   Allergy    Environmental   Arthritis    Asthma    Carotid artery stenosis    Carotid stenosis    COPD (chronic obstructive pulmonary disease) ([place])    Coronary artery disease    Coronary artery disease    Hemorrhoids    Hyperlipidemia    Hypertension    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(HCC)    negative cardiac cath   Nicotine addiction    Peripheral vascular disease ([place])    Pneumonia    Shortness of breath dyspnea    Tobacco abuse      Past Surgical History:  Procedure Laterality Date   ABDOMINAL HYSTERECTOMY     APPENDECTOMY     CARDIAC CATHETERIZATION     CARDIAC SURGERY     CHOLECYSTECTOMY     COLONOSCOPY  12/15/11   [place]->bleeding internal hemorrhoids, otherwise normal   COLONOSCOPY WITH PROPOFOL N/A 04/12/2015   Procedure: COLONOSCOPY WITH PROPOFOL;  Surgeon: Lucilla Lame, MD;  Location: [facility] ENDOSCOPY;  Service: Endoscopy;  Laterality: N/A;   COLONOSCOPY WITH PROPOFOL N/A 01/05/2020   Procedure: COLONOSCOPY WITH PROPOFOL;  Surgeon: Lucilla Lame, MD;  Location: [facility] ENDOSCOPY;  Service: Endoscopy;  Laterality: N/A;   ENDARTERECTOMY FEMORAL Left 04/26/2015   Procedure: ENDARTERECTOMY FEMORAL;  Surgeon: Algernon Huxley, MD;  Location: [facility] ORS;  Service: Vascular;  Laterality: Left;   ENDOVASCULAR STENT INSERTION Bilateral    Legs   FLEXIBLE BRONCHOSCOPY N/A 02/02/2015   Procedure: FLEXIBLE BRONCHOSCOPY;  Surgeon: Allyne Gee, MD;  Location: [facility] ORS;  Service: Pulmonary;  Laterality: N/A;   HEMORRHOID SURGERY     PERIPHERAL VASCULAR CATHETERIZATION Left 04/25/2015   Procedure: Lower Extremity  Angiography;  Surgeon: Algernon Huxley, MD;  Location: [place] [facility];  Service: Cardiovascular;  Laterality: Left;   PERIPHERAL VASCULAR CATHETERIZATION Left 04/25/2015   Procedure: Lower Extremity Intervention;  Surgeon: Algernon Huxley, MD;  Location: [place] [facility];  Service: Cardiovascular;  Laterality: Left;   PERIPHERAL VASCULAR CATHETERIZATION Left 01/02/2016   Procedure: Lower Extremity Angiography;  Surgeon: Algernon Huxley, MD;  Location: [place] [facility];  Service: Cardiovascular;  Laterality: Left;   PERIPHERAL VASCULAR CATHETERIZATION  01/02/2016   Procedure: Lower Extremity Intervention;  Surgeon: Algernon Huxley, MD;  Location: [place] [facility];  Service: Cardiovascular;;   THROMBECTOMY FEMORAL ARTERY Left 04/26/2015   Procedure: THROMBECTOMY FEMORAL ARTERY;  Surgeon: Algernon Huxley, MD;  Location: [facility] ORS;  Service: Vascular;  Laterality: Left;     Current Outpatient Medications  Medication Sig Dispense Refill   albuterol (VENTOLIN HFA) 108 (90 Base) MCG/ACT inhaler Inhale 2 puffs into the lungs every 6 (six) hours as needed for wheezing or shortness of breath. 18 g 3   aspirin EC 81 MG EC tablet Take 1 tablet (81 mg total) by mouth daily. 90 tablet 1   atorvastatin (LIPITOR) 80 MG tablet Take 1 tablet (80 mg total) by mouth daily. 90 tablet 3   budesonide-formoterol (SYMBICORT) 80-4.5 MCG/ACT inhaler  INHALE 2 PUFFS BY MOUTH ONCE DAILY 22 g 0   cholecalciferol (VITAMIN D) 400 units TABS tablet Take 400 Units by mouth.     ezetimibe (ZETIA) 10 MG tablet Take 1 tablet (10 mg total) by mouth daily. 90 tablet 3   ferrous sulfate 325 (65 FE) MG tablet Take 325 mg by mouth daily.     folic acid (FOLVITE) 1 MG tablet Take 1 tablet (1 mg total) by mouth daily. 90 tablet 1   isosorbide mononitrate (IMDUR) 60 MG 24 hr tablet Take 1 tablet (60 mg total) by mouth daily. 90 tablet 0   losartan (COZAAR) 50 MG tablet Take 2 tablets (100 mg total) by mouth daily. 180 tablet 3   metoprolol  succinate (TOPROL XL) 50 MG 24 hr tablet Take 1 tablet (50 mg total) by mouth daily. Take with or immediately following a meal. 30 tablet 3   REPATHA SURECLICK [id number] MG/ML SOAJ Inject 140 mg into the skin every 14 (fourteen) days.     butalbital-acetaminophen-caffeine (FIORICET) 50-325-40 MG tablet Take 1 tablet by mouth every 6 (six) hours as needed for headache. (Patient not taking: Reported on 11/20/2022) 14 tablet 4   No current facility-administered medications for this visit.    Allergies:   Aspirin, Tramadol, and Zyrtec [cetirizine]    Social History:   reports that she has been smoking cigarettes. She has a 41.00 pack-year smoking history. She has never used smokeless tobacco. She reports current alcohol use. She reports that she does not use drugs.   Family History:  family history includes Hypertension in her father and mother.    ROS:     Review of Systems  Constitutional: Negative.   HENT: Negative.    Eyes: Negative.   Respiratory: Negative.    Gastrointestinal: Negative.   Genitourinary: Negative.   Musculoskeletal: Negative.   Skin: Negative.   Neurological: Negative.   Endo/Heme/Allergies: Negative.   Psychiatric/Behavioral: Negative.    All other systems reviewed and are negative.     All other systems are reviewed and negative.    PHYSICAL EXAM: VS:  BP 96/64   Pulse 93   Ht 5\' 4"  (1.626 m)   Wt 113 lb (51.3 kg)   LMP  (LMP Unknown)   SpO2 94%   BMI 19.40 kg/m  , BMI Body mass index is 19.4 kg/m. Last weight:  Wt Readings from Last 3 Encounters:  11/20/22 113 lb (51.3 kg)  10/22/22 111 lb 12.8 oz (50.7 kg)  09/18/22 112 lb 9.6 oz (51.1 kg)     Physical Exam Constitutional:      Appearance: Normal appearance.  Cardiovascular:     Rate and Rhythm: Normal rate and regular rhythm.     Heart sounds: Normal heart sounds.  Pulmonary:     Effort: Pulmonary effort is normal.     Breath sounds: Normal breath sounds.  Musculoskeletal:     Right lower  leg: No edema.     Left lower leg: No edema.  Neurological:     Mental Status: She is alert.       EKG:   Recent Labs: 05/28/2022: ALT 11; BUN 13; Creatinine, Ser 0.98; Hemoglobin 12.7; Platelets 151; Potassium 4.2; Sodium 140; TSH 1.010    Lipid Panel    Component Value Date/Time   CHOL 149 05/28/2022 1103   CHOL 147 10/16/2013 0439   TRIG 76 05/28/2022 1103   TRIG 122 10/16/2013 0439   HDL 79 05/28/2022 1103   HDL 31 (L)  10/16/2013 0439   CHOLHDL 1.9 05/28/2022 1103   CHOLHDL 1.4 08/06/2021 0539   VLDL 9 08/06/2021 0539   VLDL 24 10/16/2013 0439   LDLCALC 55 05/28/2022 1103   LDLCALC 92 10/16/2013 0439      Other studies Reviewed: Additional studies/ records that were reviewed today include:  Review of the above records demonstrates:   REASON FOR VISIT  Referred by Dr.Rahkeem Senft Humphrey Rolls.        TESTS  Imaging: Computed Tomographic Angiography:  Cardiac multidetector CT was performed paying particular attention to the coronary arteries for the diagnosis of: CAD. Diagnostic Drugs:  Administered iohexol (Omnipaque) through an antecubital vein and images from the examination were analyzed for the presence and extent of coronary artery disease, using 3D image processing software. 100 mL of non-ionic contrast (Omnipaque) was used.        TEST CONCLUSIONS  Quality of study: Fair.  1-Calcium score: 2448.0  2-Left dominant system.  3-60% mid LAD disease with severe calcification. LCX mild to moderate diffuse disease with severe calcification. Small RCA. Consider Cath if continures to have chest pain.      Neoma Laming MD  Electronically signed by: Neoma Laming     Date: 06/19/2021 10:04 REASON FOR VISIT  Visit for: Echocardiogram - PAD and SOB  Sex: Female   wt= 123 lbs.  BP= 144/82  Height= 64 inches.        TESTS  Imaging: Echocardiogram:  An echocardiogram in (2-d) mode was performed and in Doppler mode with color flow velocity mapping was  performed. The aortic valve cusps are normal 1.8 cm, flow velocity was normal 1.2 m/s, and normal calculated aortic valve systolic mean flow gradient 4.0 mmHg. Mitral valve diastolic peak flow velocity E .50 m/s and E/A ratio 0.7. Aortic root diameter 3.0 cm. The LVOT internal diameter was normal 2.1 cm and flow velocity was normal .99 m/s. LV systolic dimension 2.6 cm, diastolic 3.7 cm, posterior wall thickness 1.3 cm, fractional shortening 28 %, and EF 55-60 %. IVS thickness 1.4 cm. LA dimension 5.5 cm  RIGHT atrium= 12.8 cm2. Mitral Valve =  Ea= 8.2  DT= 345 msec. Tricuspid Valve =  TR jet V= 2.3     RAP= 5  RVSP= 27.0 mmHg. Pulmonic Valve= PIEDV=.50 m/s. Mitral Valve has Mild Regurgitation. Aortic Valve has Trace Regurgitation. Pulmonic Valve has Trace Regurgitation. Tricuspid Valve has Mild to Moderate  Regurgitation.     ASSESSMENT  Suboptimal study due to poor windows.  Severely dilated left atrium with left ventricle, right atrium and ventricle, and aorta appearing normal in size.  Normal LV systolic function.  Right ventricular systolic dysfunction.  Mild inferior hypokinesis.  Mild left ventricular hypertrophy with GRADE 1 (relaxation abnormality) diastolic dysfunction.  Right ventricular diastolic dysfunction.  Trace pulmonary regurgitation.  Mild to moderate tricuspid regurgitation.   Normal pulmonary artery pressure.   Mild mitral regurgitation.  Trace aortic regurgitation.  Trivial posterior pericardial effusion without hemodynamic compromise or tamponade.     THERAPY   Referring physician: Dionisio David  Sonographer: Welton Flakes, RCS.      Neoma Laming MD  Electronically signed by: Neoma Laming     Date: 10/29/2019 15:31     No data to display            ASSESSMENT AND PLAN:    ICD-10-CM   1. Atherosclerosis of native artery of right lower extremity with intermittent claudication ([place])  I70.211     2. Stenosis of carotid  artery,  unspecified laterality  I65.29     3. Essential hypertension  I10     4. Cigarette nicotine dependence with nicotine-induced disorder  F17.219     5. H/O hypercholesterolemia  Z86.39     6. Coronary artery disease involving native coronary artery of native heart without angina pectoris  I25.10    had occluded proximal RCA, in past, echo showed normal LVEF mild MR/TR, and stress test was normal.       Problem List Items Addressed This Visit       Cardiovascular and Mediastinum   Essential hypertension   Atherosclerosis of native arteries of extremity with intermittent claudication (HCC) - Primary   Carotid stenosis     Nervous and Auditory   Cigarette nicotine dependence with nicotine-induced disorder     Other   H/O hypercholesterolemia   Other Visit Diagnoses     Coronary artery disease involving native coronary artery of native heart without angina pectoris       had occluded proximal RCA, in past, echo showed normal LVEF mild MR/TR, and stress test was normal.          Disposition:   Return in about 3 months (around 02/20/2023).    Total time spent: 30 minutes  Signed,  Neoma Laming, MD  11/20/2022 11:03 AM    [place]

## 2022-11-22 NOTE — Progress Notes (Signed)
Closing encounter for provider.   

## 2022-11-23 ENCOUNTER — Telehealth: Payer: Self-pay | Admitting: Nurse Practitioner

## 2022-11-23 ENCOUNTER — Ambulatory Visit (INDEPENDENT_AMBULATORY_CARE_PROVIDER_SITE_OTHER): Payer: 59 | Admitting: Nurse Practitioner

## 2022-11-23 ENCOUNTER — Encounter: Payer: Self-pay | Admitting: Nurse Practitioner

## 2022-11-23 VITALS — BP 133/67 | HR 81 | Temp 96.9°F | Resp 16 | Ht 64.0 in | Wt 112.8 lb

## 2022-11-23 DIAGNOSIS — I10 Essential (primary) hypertension: Secondary | ICD-10-CM

## 2022-11-23 DIAGNOSIS — F17210 Nicotine dependence, cigarettes, uncomplicated: Secondary | ICD-10-CM

## 2022-11-23 DIAGNOSIS — I2583 Coronary atherosclerosis due to lipid rich plaque: Secondary | ICD-10-CM

## 2022-11-23 DIAGNOSIS — I2510 Atherosclerotic heart disease of native coronary artery without angina pectoris: Secondary | ICD-10-CM

## 2022-11-23 DIAGNOSIS — J449 Chronic obstructive pulmonary disease, unspecified: Secondary | ICD-10-CM

## 2022-11-23 MED ORDER — ISOSORBIDE MONONITRATE ER 60 MG PO TB24: 60.0000 mg | ORAL_TABLET | Freq: Every day | ORAL | 1 refills | Status: DC

## 2022-11-23 MED ORDER — BUDESONIDE-FORMOTEROL FUMARATE 80-4.5 MCG/ACT IN AERO: INHALATION_SPRAY | RESPIRATORY_TRACT | 2 refills | Status: DC

## 2022-11-23 NOTE — Telephone Encounter (Signed)
CT lung screening order faxed to [facility]

## 2022-11-23 NOTE — Progress Notes (Signed)
[facility] [place], [place] [address]  Internal MEDICINE  Office Visit Note  Patient Name: [name]  [id number]  [id number]  Date of Service: 11/23/2022  Chief Complaint  Patient presents with   Hyperlipidemia   Hypertension   Follow-up    HPI [name] presents for a follow-up visit for hypertension, COPD, high cholesterol. Hypertension -- controlled with current medications COPD -- is a current smoker, uses symbicort for maintenance and albuterol for rescue inhaler.  High cholesterol -- takes max dose atorvastatin and ezetimibe. Is no longer taking repatha injections Tobacco use -- current smoker, about 1/2 ppd >30 years, due for routine lung cancer screening.     Current Medication: Outpatient Encounter Medications as of 11/23/2022  Medication Sig   albuterol (VENTOLIN HFA) 108 (90 Base) MCG/ACT inhaler Inhale 2 puffs into the lungs every 6 (six) hours as needed for wheezing or shortness of breath.   aspirin EC 81 MG EC tablet Take 1 tablet (81 mg total) by mouth daily.   atorvastatin (LIPITOR) 80 MG tablet Take 1 tablet (80 mg total) by mouth daily.   butalbital-acetaminophen-caffeine (FIORICET) 50-325-40 MG tablet Take 1 tablet by mouth every 6 (six) hours as needed for headache.   cholecalciferol (VITAMIN D) 400 units TABS tablet Take 400 Units by mouth.   ezetimibe (ZETIA) 10 MG tablet Take 1 tablet (10 mg total) by mouth daily.   ferrous sulfate 325 (65 FE) MG tablet Take 325 mg by mouth daily.   losartan (COZAAR) 50 MG tablet Take 2 tablets (100 mg total) by mouth daily.   metoprolol succinate (TOPROL XL) 50 MG 24 hr tablet Take 1 tablet (50 mg total) by mouth daily. Take with or immediately following a meal.   [DISCONTINUED] budesonide-formoterol (SYMBICORT) 80-4.5 MCG/ACT inhaler INHALE 2 PUFFS BY MOUTH ONCE DAILY   [DISCONTINUED] folic acid (FOLVITE) 1 MG tablet Take 1 tablet (1 mg total) by mouth daily.   [DISCONTINUED] isosorbide  mononitrate (IMDUR) 60 MG 24 hr tablet Take 1 tablet (60 mg total) by mouth daily.   [DISCONTINUED] REPATHA SURECLICK [id number] MG/ML SOAJ Inject 140 mg into the skin every 14 (fourteen) days.   budesonide-formoterol (SYMBICORT) 80-4.5 MCG/ACT inhaler INHALE 2 PUFFS BY MOUTH ONCE DAILY   isosorbide mononitrate (IMDUR) 60 MG 24 hr tablet Take 1 tablet (60 mg total) by mouth daily.   No facility-administered encounter medications on file as of 11/23/2022.    Surgical History: Past Surgical History:  Procedure Laterality Date   ABDOMINAL HYSTERECTOMY     APPENDECTOMY     CARDIAC CATHETERIZATION     CARDIAC SURGERY     CHOLECYSTECTOMY     COLONOSCOPY  12/15/11   OH->bleeding internal hemorrhoids, otherwise normal   COLONOSCOPY WITH PROPOFOL N/A 04/12/2015   Procedure: COLONOSCOPY WITH PROPOFOL;  Surgeon: Lucilla Lame, MD;  Location: [facility] ENDOSCOPY;  Service: Endoscopy;  Laterality: N/A;   COLONOSCOPY WITH PROPOFOL N/A 01/05/2020   Procedure: COLONOSCOPY WITH PROPOFOL;  Surgeon: Lucilla Lame, MD;  Location: [facility] ENDOSCOPY;  Service: Endoscopy;  Laterality: N/A;   ENDARTERECTOMY FEMORAL Left 04/26/2015   Procedure: ENDARTERECTOMY FEMORAL;  Surgeon: Algernon Huxley, MD;  Location: [facility] ORS;  Service: Vascular;  Laterality: Left;   ENDOVASCULAR STENT INSERTION Bilateral    Legs   FLEXIBLE BRONCHOSCOPY N/A 02/02/2015   Procedure: FLEXIBLE BRONCHOSCOPY;  Surgeon: Allyne Gee, MD;  Location: [facility] ORS;  Service: Pulmonary;  Laterality: N/A;   HEMORRHOID SURGERY     PERIPHERAL VASCULAR CATHETERIZATION Left 04/25/2015  Procedure: Lower Extremity Angiography;  Surgeon: Algernon Huxley, MD;  Location: [place] [facility];  Service: Cardiovascular;  Laterality: Left;   PERIPHERAL VASCULAR CATHETERIZATION Left 04/25/2015   Procedure: Lower Extremity Intervention;  Surgeon: Algernon Huxley, MD;  Location: [place] [facility];  Service: Cardiovascular;  Laterality: Left;   PERIPHERAL VASCULAR CATHETERIZATION Left 01/02/2016    Procedure: Lower Extremity Angiography;  Surgeon: Algernon Huxley, MD;  Location: [place] [facility];  Service: Cardiovascular;  Laterality: Left;   PERIPHERAL VASCULAR CATHETERIZATION  01/02/2016   Procedure: Lower Extremity Intervention;  Surgeon: Algernon Huxley, MD;  Location: [place] [facility];  Service: Cardiovascular;;   THROMBECTOMY FEMORAL ARTERY Left 04/26/2015   Procedure: THROMBECTOMY FEMORAL ARTERY;  Surgeon: Algernon Huxley, MD;  Location: [facility];  Service: Vascular;  Laterality: Left;    Medical History: Past Medical History:  Diagnosis Date   Allergy    Environmental   Arthritis    Asthma    Carotid artery stenosis    Carotid stenosis    COPD (chronic obstructive pulmonary disease) (HCC)    Coronary artery disease    Coronary artery disease    Hemorrhoids    Hyperlipidemia    Hypertension    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(HCC)    negative cardiac cath   Nicotine addiction    Peripheral vascular disease (HCC)    Pneumonia    Shortness of breath dyspnea    Tobacco abuse     Family History: Family History  Problem Relation Age of Onset   Hypertension Mother    Hypertension Father     Social History   Socioeconomic History   Marital status: Single    Spouse name: Not on file   Number of children: Not on file   Years of education: Not on file   Highest education level: Not on file  Occupational History   Not on file  Tobacco Use   Smoking status: Every Day    Packs/day: 1.00    Years: 41.00    Additional pack years: 0.00    Total pack years: 41.00    Types: Cigarettes   Smokeless tobacco: Never  Vaping Use   Vaping Use: Never used  Substance and Sexual Activity   Alcohol use: Yes    Alcohol/week: 0.0 standard drinks of alcohol    Comment: weekends, couple beers, pint of liquer only on weekends.  Last drank approx 1 month ago.   Drug use: No   Sexual activity: Not Currently  Other Topics Concern   Not on file  Social History Narrative   Lives with family     Social Determinants of Health   Financial Resource Strain: Low Risk  (07/20/2021)   Overall Financial Resource Strain (CARDIA)    Difficulty of Paying Living Expenses: Not very hard  Food Insecurity: No Food Insecurity (04/04/2018)   Hunger Vital Sign    Worried About Running Out of Food in the Last Year: Never true    Ran Out of Food in the Last Year: Never true  Transportation Needs: No Transportation Needs (04/04/2018)   PRAPARE - Hydrologist (Medical): No    Lack of Transportation (Non-Medical): No  Physical Activity: Inactive (04/04/2018)   Exercise Vital Sign    Days of Exercise per Week: 0 days    Minutes of Exercise per Session: 0 min  Stress: No Stress Concern Present (04/04/2018)   [employer] [place]  Feeling of Stress : Only a little  Social Connections: Moderately Isolated (04/04/2018)   Social Connection and Isolation Panel [NHANES]    Frequency of Communication with Friends and Family: Once a week    Frequency of Social Gatherings with Friends and Family: Once a week    Attends Religious Services: 1 to 4 times per year    Active Member of [employer] or Organizations: No    Attends Archivist Meetings: Never    Marital Status: Never married  Intimate Partner Violence: Not At Risk (04/04/2018)   Humiliation, Afraid, Rape, and Kick questionnaire    Fear of Current or Ex-Partner: No    Emotionally Abused: No    Physically Abused: No    Sexually Abused: No      Review of Systems  Constitutional:  Negative for chills, fatigue and unexpected weight change.  HENT:  Negative for congestion, rhinorrhea, sneezing and sore throat.   Eyes:  Negative for redness.  Respiratory: Negative.  Negative for cough, chest tightness, shortness of breath and wheezing.   Cardiovascular: Negative.  Negative for chest pain and palpitations.  Gastrointestinal:  Negative for abdominal pain, constipation,  diarrhea, nausea and vomiting.  Genitourinary:  Negative for dysuria and frequency.  Musculoskeletal:  Negative for arthralgias, back pain, joint swelling and neck pain.  Skin:  Negative for rash.  Neurological: Negative.  Negative for tremors and numbness.  Hematological:  Negative for adenopathy. Does not bruise/bleed easily.  Psychiatric/Behavioral:  Negative for behavioral problems (Depression), sleep disturbance and suicidal ideas. The patient is not nervous/anxious.     Vital Signs: BP 133/67   Pulse 81   Temp (!) 96.9 F (36.1 C)   Resp 16   Ht 5\' 4"  (1.626 m)   Wt 112 lb 12.8 oz (51.2 kg)   LMP  (LMP Unknown)   SpO2 97%   BMI 19.36 kg/m    Physical Exam Vitals reviewed.  Constitutional:      Appearance: Normal appearance. She is normal weight.  HENT:     Head: Normocephalic and atraumatic.  Eyes:     Pupils: Pupils are equal, round, and reactive to light.  Cardiovascular:     Rate and Rhythm: Normal rate and regular rhythm.  Pulmonary:     Effort: Pulmonary effort is normal. No respiratory distress.  Neurological:     Mental Status: She is alert and oriented to person, place, and time.  Psychiatric:        Mood and Affect: Mood normal.        Behavior: Behavior normal.        Assessment/Plan: 1. Essential hypertension Stable, continue losartan and metoprolol as prescribed.   2. Coronary artery disease due to lipid rich plaque Stable, continue atorvastatin, ezetimibe, and isosorbide as prescribed. Refills of isosorbide ordered - isosorbide mononitrate (IMDUR) 60 MG 24 hr tablet; Take 1 tablet (60 mg total) by mouth daily.  Dispense: 90 tablet; Refill: 1  3. Chronic obstructive pulmonary disease, unspecified COPD type ([place]) Stable, continue symbicort as prescribed.  - budesonide-formoterol (SYMBICORT) 80-4.5 MCG/ACT inhaler; INHALE 2 PUFFS BY MOUTH ONCE DAILY  Dispense: 22 g; Refill: 2  4. Smokes less than 1 pack a day with greater than 30 pack year  history Low dose CT chest screening ordered.  Not interested in smoking cessation at this time. - CT CHEST LUNG CA SCREEN LOW DOSE W/O CM; Future    General Counseling: [name] verbalizes understanding of the findings of todays visit and agrees with plan  of treatment. I have discussed any further diagnostic evaluation that may be needed or ordered today. We also reviewed her medications today. she has been encouraged to call the office with any questions or concerns that should arise related to todays visit.    Orders Placed This Encounter  Procedures   CT CHEST LUNG CA SCREEN LOW DOSE W/O CM    Meds ordered this encounter  Medications   isosorbide mononitrate (IMDUR) 60 MG 24 hr tablet    Sig: Take 1 tablet (60 mg total) by mouth daily.    Dispense:  90 tablet    Refill:  1   budesonide-formoterol (SYMBICORT) 80-4.5 MCG/ACT inhaler    Sig: INHALE 2 PUFFS BY MOUTH ONCE DAILY    Dispense:  22 g    Refill:  2    Return for previously scheduled, CPE, Abdulahad Mederos PCP.   Total time spent:30 Minutes Time spent includes review of chart, medications, test results, and follow up plan with the patient.   [place] Controlled Substance Database was reviewed by me.  This patient was seen by Jonetta Osgood, FNP-C in collaboration with Dr. Clayborn Bigness as a part of collaborative care agreement.   Avneet Ashmore R. Valetta Fuller, MSN, FNP-C Internal medicine

## 2022-11-24 ENCOUNTER — Encounter: Payer: Self-pay | Admitting: Nurse Practitioner

## 2022-12-06 ENCOUNTER — Ambulatory Visit
Admission: RE | Admit: 2022-12-06 | Discharge: 2022-12-06 | Disposition: A | Discharge status: Home / Self Care | Payer: 59 | Source: Ambulatory Visit | Attending: Nurse Practitioner | Admitting: Nurse Practitioner

## 2022-12-06 DIAGNOSIS — F17210 Nicotine dependence, cigarettes, uncomplicated: Secondary | ICD-10-CM | POA: Diagnosis not present

## 2022-12-07 ENCOUNTER — Telehealth: Payer: Self-pay | Admitting: Nurse Practitioner

## 2022-12-07 NOTE — Telephone Encounter (Signed)
Spoke with pt as per dr Welton Flakes make her appt with DSK to discuss CT at Monday

## 2022-12-07 NOTE — Telephone Encounter (Signed)
Spoke with [facility] that we already have result in epic for CT we will forward to dr

## 2022-12-10 ENCOUNTER — Ambulatory Visit (INDEPENDENT_AMBULATORY_CARE_PROVIDER_SITE_OTHER): Payer: 59 | Admitting: Internal Medicine

## 2022-12-10 ENCOUNTER — Encounter: Payer: Self-pay | Admitting: Internal Medicine

## 2022-12-10 VITALS — BP 116/70 | HR 73 | Temp 98.7°F | Resp 16 | Ht 64.0 in | Wt 110.0 lb

## 2022-12-10 DIAGNOSIS — J4489 Other specified chronic obstructive pulmonary disease: Secondary | ICD-10-CM | POA: Diagnosis not present

## 2022-12-10 DIAGNOSIS — R0602 Shortness of breath: Secondary | ICD-10-CM | POA: Diagnosis not present

## 2022-12-10 DIAGNOSIS — R911 Solitary pulmonary nodule: Secondary | ICD-10-CM | POA: Diagnosis not present

## 2022-12-10 DIAGNOSIS — F17209 Nicotine dependence, unspecified, with unspecified nicotine-induced disorders: Secondary | ICD-10-CM | POA: Diagnosis not present
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
## 2022-12-10 NOTE — Patient Instructions (Signed)
Pulmonary Nodule  A pulmonary nodule is a small, round growth of tissue in the lung. A nodule may be cancer, but most nodules are not cancer. What are the causes? Infection from a germ (bacteria, fungus, or virus), such as tuberculosis. Tissue that is cancer, such as: Cancer in the lung. Cancer that has spread to the lung from another part of the body. A growth of tissue (mass) that is not cancer. Swelling and irritation from conditions such as rheumatoid arthritis. Having blood vessels that are not normal in the lungs. What are the signs or symptoms? Many times, there are no symptoms. If you get symptoms, they normally have another cause, such as infection. How is this treated? Treatment depends on: If your nodule is cancer or if it is not cancer. What your risk of getting cancer is. Some nodules are not cancer. If this is the case for you, you may not need treatment. Your doctor may do tests to watch the nodule for changes. If the nodule is cancer: You will need tests, such as CT and PET scans. You may need treatment. This may include: Surgery. Treatment with high-energy X-rays (radiation therapy). Medicines. Some nodules need to be taken out. You may have a procedure to have the nodule taken out. During the procedure, your doctor will make a cut (incision) into your chest and take out the part of your lung that has the nodule. Follow these instructions at home: Take over-the-counter and prescription medicines only as told by your doctor. Do not smoke or use any products that contain nicotine or tobacco. If you need help quitting, ask your doctor. Keep all follow-up visits. Contact a doctor if: You have pain in your chest, back, or shoulder. You are short of breath or have trouble breathing when you are active. You get a cough. Your voice starts to sound raspy, breathy, or strained (hoarse), and you do not know why. You feel sick or more tired than normal. You do not feel like  eating. You lose weight without trying. You get chills, or you start to sweat a lot during sleep. You need two or more pillows to sleep on at night. You have: A fever and your symptoms get worse all of a sudden. A fever or symptoms for more than 2-3 days. Get help right away if: You cannot catch your breath. You have sudden chest pain. You start making high-pitched whistling sounds when you breathe, most often when you breathe out (you wheeze). You cannot stop coughing. You cough up blood or bloody mucus from your lungs (sputum). You get dizzy or feel like you may faint. These symptoms may represent a serious problem that is an emergency. Do not wait to see if the symptoms will go away. Get medical help right away. Call your local emergency services (911 in the [place] Do not drive yourself to the hospital. Summary A pulmonary nodule is a small, round growth of tissue in the lung. Most of these nodules are not cancer. Common causes of nodules in the lung include infection, swelling and irritation, and growths that are not cancer. Treatment depends on whether the nodule is cancer or is not cancer. Treatment also depends on your risk of getting cancer. If the nodule is cancer, you will need certain tests and treatments as told by your doctor. This information is not intended to replace advice given to you by your health care provider. Make sure you discuss any questions you have with your health care provider. Document   Revised: 03/09/2020 Document Reviewed: 03/09/2020 Elsevier Patient Education  2023 [employer] Inc[employer]  

## 2022-12-19 ENCOUNTER — Ambulatory Visit (INDEPENDENT_AMBULATORY_CARE_PROVIDER_SITE_OTHER): Payer: 59 | Admitting: Internal Medicine

## 2022-12-19 DIAGNOSIS — R0602 Shortness of breath: Secondary | ICD-10-CM

## 2022-12-20 ENCOUNTER — Ambulatory Visit
Admission: RE | Admit: 2022-12-20 | Discharge: 2022-12-20 | Disposition: A | Discharge status: Home / Self Care | Payer: 59 | Source: Ambulatory Visit | Attending: Internal Medicine | Admitting: Internal Medicine

## 2022-12-20 DIAGNOSIS — R911 Solitary pulmonary nodule: Secondary | ICD-10-CM

## 2022-12-21 ENCOUNTER — Ambulatory Visit: Payer: 59 | Admitting: Nurse Practitioner

## 2022-12-24 ENCOUNTER — Encounter
Admission: RE | Admit: 2022-12-24 | Discharge: 2022-12-24 | Disposition: A | Discharge status: Home / Self Care | Payer: 59 | Source: Ambulatory Visit | Attending: Internal Medicine | Admitting: Internal Medicine

## 2022-12-24 DIAGNOSIS — R911 Solitary pulmonary nodule: Secondary | ICD-10-CM

## 2022-12-24 LAB — GLUCOSE, CAPILLARY: Glucose-Capillary: 75 mg/dL (ref 70–99)

## 2022-12-24 MED ORDER — FLUDEOXYGLUCOSE F - 18 (FDG) INJECTION
6.0800 | Freq: Once | INTRAVENOUS | Status: AC | PRN
  Administered 2022-12-24: 6.08 via INTRAVENOUS

## 2022-12-26 ENCOUNTER — Other Ambulatory Visit: Payer: Self-pay | Admitting: Nurse Practitioner

## 2022-12-26 DIAGNOSIS — J449 Chronic obstructive pulmonary disease, unspecified: Secondary | ICD-10-CM

## 2022-12-31 ENCOUNTER — Encounter: Payer: Self-pay | Admitting: Internal Medicine

## 2022-12-31 ENCOUNTER — Telehealth: Payer: Self-pay | Admitting: Internal Medicine

## 2022-12-31 ENCOUNTER — Ambulatory Visit (INDEPENDENT_AMBULATORY_CARE_PROVIDER_SITE_OTHER): Payer: 59 | Admitting: Internal Medicine

## 2022-12-31 VITALS — BP 125/80 | HR 80 | Temp 98.4°F | Resp 16 | Ht 64.0 in | Wt 110.2 lb

## 2022-12-31 DIAGNOSIS — F17209 Nicotine dependence, unspecified, with unspecified nicotine-induced disorders: Secondary | ICD-10-CM | POA: Diagnosis not present

## 2022-12-31 DIAGNOSIS — J4489 Other specified chronic obstructive pulmonary disease: Secondary | ICD-10-CM | POA: Diagnosis not present

## 2022-12-31 DIAGNOSIS — R911 Solitary pulmonary nodule: Secondary | ICD-10-CM

## 2022-12-31 NOTE — Telephone Encounter (Signed)
Awaiting 12/31/22 office notes for Cardiothoracic Surgery referral-Toni
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
## 2023-01-02 ENCOUNTER — Encounter: Payer: 59 | Admitting: Internal Medicine

## 2023-01-08 NOTE — Procedures (Signed)
[facility] [address] [place], [address]    Complete Pulmonary Function Testing Interpretation:  FINDINGS:  The forced vital capacity is mildly decreased.  FEV1 is 0.93 L which is 47% of predicted and is severely decreased.  Postbronchodilator there was significant improvement in the FEV1.  FEV1 FVC ratio is moderately decreased.  Total lung capacity is normal residual volume is increased FRC is increased.  DLCO is severely decreased.  IMPRESSION:  Severe obstructive lung disease with a reversible component.  Clinical correlation is recommended  Yevonne Pax, MD [facility] Pulmonary Critical Care Medicine Sleep Medicine

## 2023-01-18 ENCOUNTER — Other Ambulatory Visit: Payer: Self-pay | Admitting: Cardiovascular Disease

## 2023-01-18 DIAGNOSIS — R000 Tachycardia, unspecified: Secondary | ICD-10-CM

## 2023-01-19 ENCOUNTER — Other Ambulatory Visit: Payer: Self-pay | Admitting: Nurse Practitioner

## 2023-01-19 DIAGNOSIS — J449 Chronic obstructive pulmonary disease, unspecified: Secondary | ICD-10-CM

## 2023-01-29 ENCOUNTER — Telehealth: Payer: Self-pay | Admitting: Internal Medicine

## 2023-01-29 NOTE — Telephone Encounter (Signed)
Cardiothoracic referral faxed to [facility]; [phone]

## 2023-02-07 DIAGNOSIS — R911 Solitary pulmonary nodule: Secondary | ICD-10-CM | POA: Diagnosis not present

## 2023-02-07 LAB — PULMONARY FUNCTION TEST

## 2023-02-14 DIAGNOSIS — F17210 Nicotine dependence, cigarettes, uncomplicated: Secondary | ICD-10-CM | POA: Diagnosis not present

## 2023-02-14 DIAGNOSIS — R9431 Abnormal electrocardiogram [ECG] [EKG]: Secondary | ICD-10-CM | POA: Diagnosis not present

## 2023-02-14 DIAGNOSIS — I2510 Atherosclerotic heart disease of native coronary artery without angina pectoris: Secondary | ICD-10-CM | POA: Diagnosis not present

## 2023-02-14 DIAGNOSIS — M5134 Other intervertebral disc degeneration, thoracic region: Secondary | ICD-10-CM | POA: Diagnosis not present

## 2023-02-14 DIAGNOSIS — R911 Solitary pulmonary nodule: Secondary | ICD-10-CM | POA: Diagnosis not present

## 2023-02-14 DIAGNOSIS — J449 Chronic obstructive pulmonary disease, unspecified: Secondary | ICD-10-CM | POA: Diagnosis not present

## 2023-02-14 DIAGNOSIS — J432 Centrilobular emphysema: Secondary | ICD-10-CM | POA: Diagnosis not present

## 2023-02-21 ENCOUNTER — Encounter: Payer: Self-pay | Admitting: Cardiovascular Disease

## 2023-02-21 ENCOUNTER — Ambulatory Visit (INDEPENDENT_AMBULATORY_CARE_PROVIDER_SITE_OTHER): Payer: 59 | Admitting: Cardiovascular Disease

## 2023-02-21 VITALS — BP 100/58 | HR 70 | Ht 64.0 in | Wt 108.0 lb

## 2023-02-21 DIAGNOSIS — I2583 Coronary atherosclerosis due to lipid rich plaque: Secondary | ICD-10-CM

## 2023-02-21 DIAGNOSIS — F17209 Nicotine dependence, unspecified, with unspecified nicotine-induced disorders: Secondary | ICD-10-CM

## 2023-02-21 DIAGNOSIS — I10 Essential (primary) hypertension: Secondary | ICD-10-CM

## 2023-02-21 DIAGNOSIS — I2510 Atherosclerotic heart disease of native coronary artery without angina pectoris: Secondary | ICD-10-CM

## 2023-02-21 DIAGNOSIS — Z01810 Encounter for preprocedural cardiovascular examination: Secondary | ICD-10-CM

## 2023-02-21 DIAGNOSIS — J441 Chronic obstructive pulmonary disease with (acute) exacerbation: Secondary | ICD-10-CM | POA: Diagnosis not present

## 2023-02-21 DIAGNOSIS — I70213 Atherosclerosis of native arteries of extremities with intermittent claudication, bilateral legs: Secondary | ICD-10-CM

## 2023-02-21 DIAGNOSIS — R911 Solitary pulmonary nodule: Secondary | ICD-10-CM | POA: Diagnosis not present

## 2023-02-21 DIAGNOSIS — C3411 Malignant neoplasm of upper lobe, right bronchus or lung: Secondary | ICD-10-CM

## 2023-02-21 NOTE — Progress Notes (Signed)
Cardiology Office Note   Date:  02/21/2023   ID:  [name], DOB 01-14-1956, MRN [id number]  PCP:  Sallyanne Kuster, NP  Cardiologist:  Adrian Blackwater, MD      History of Present Illness: [name] is a 67 y.o. female who presents for  Chief Complaint  Patient presents with   Follow-up    3 months follow up    Has lung cancer, and is undergoing lobectomy on 03/20/23. Still smoking.      Past Medical History:  Diagnosis Date   Allergy    Environmental   Arthritis    Asthma    Carotid artery stenosis    Carotid stenosis    COPD (chronic obstructive pulmonary disease) (HCC)    Coronary artery disease    Coronary artery disease    Hemorrhoids    Hyperlipidemia    Hypertension    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(HCC)    negative cardiac cath   Nicotine addiction    Peripheral vascular disease (HCC)    Pneumonia    Shortness of breath dyspnea    Tobacco abuse      Past Surgical History:  Procedure Laterality Date   ABDOMINAL HYSTERECTOMY     APPENDECTOMY     CARDIAC CATHETERIZATION     CARDIAC SURGERY     CHOLECYSTECTOMY     COLONOSCOPY  12/15/11   OH->bleeding internal hemorrhoids, otherwise normal   COLONOSCOPY WITH PROPOFOL N/A 04/12/2015   Procedure: COLONOSCOPY WITH PROPOFOL;  Surgeon: Midge Minium, MD;  Location: [facility] ENDOSCOPY;  Service: Endoscopy;  Laterality: N/A;   COLONOSCOPY WITH PROPOFOL N/A 01/05/2020   Procedure: COLONOSCOPY WITH PROPOFOL;  Surgeon: Midge Minium, MD;  Location: [facility] ENDOSCOPY;  Service: Endoscopy;  Laterality: N/A;   ENDARTERECTOMY FEMORAL Left 04/26/2015   Procedure: ENDARTERECTOMY FEMORAL;  Surgeon: Annice Needy, MD;  Location: [facility] ORS;  Service: Vascular;  Laterality: Left;   ENDOVASCULAR STENT INSERTION Bilateral    Legs   FLEXIBLE BRONCHOSCOPY N/A 02/02/2015   Procedure: FLEXIBLE BRONCHOSCOPY;  Surgeon: Yevonne Pax, MD;  Location: [facility] ORS;  Service: Pulmonary;  Laterality: N/A;   HEMORRHOID SURGERY     PERIPHERAL VASCULAR  CATHETERIZATION Left 04/25/2015   Procedure: Lower Extremity Angiography;  Surgeon: Annice Needy, MD;  Location: [facility] INV[facility]IVE CV [facility];  Service: Cardiovascular;  Laterality: Left;   PERIPHERAL VASCULAR CATHETERIZATION Left 04/25/2015   Procedure: Lower Extremity Intervention;  Surgeon: Annice Needy, MD;  Location: [facility];  Service: Cardiovascular;  Laterality: Left;   PERIPHERAL VASCULAR CATHETERIZATION Left 01/02/2016   Procedure: Lower Extremity Angiography;  Surgeon: Annice Needy, MD;  Location: [facility];  Service: Cardiovascular;  Laterality: Left;   PERIPHERAL VASCULAR CATHETERIZATION  01/02/2016   Procedure: Lower Extremity Intervention;  Surgeon: Annice Needy, MD;  Location: [facility];  Service: Cardiovascular;;   THROMBECTOMY FEMORAL ARTERY Left 04/26/2015   Procedure: THROMBECTOMY FEMORAL ARTERY;  Surgeon: Annice Needy, MD;  Location: [facility] ORS;  Service: Vascular;  Laterality: Left;     Current Outpatient Medications  Medication Sig Dispense Refill   albuterol (VENTOLIN HFA) 108 (90 Base) MCG/ACT inhaler INHALE 2 PUFFS BY MOUTH EVERY 6 HOURS AS NEEDED FOR WHEEZING FOR SHORTNESS OF BREATH 9 g 0   aspirin EC 81 MG EC tablet Take 1 tablet (81 mg total) by mouth daily. 90 tablet 1   atorvastatin (LIPITOR) 80 MG tablet Take 1 tablet (80 mg total) by mouth daily. 90 tablet 3  budesonide-formoterol (SYMBICORT) 80-4.5 MCG/ACT inhaler INHALE 2 PUFFS BY MOUTH ONCE DAILY 22 g 2   butalbital-acetaminophen-caffeine (FIORICET) 50-325-40 MG tablet Take 1 tablet by mouth every 6 (six) hours as needed for headache. 14 tablet 4   cholecalciferol (VITAMIN D) 400 units TABS tablet Take 400 Units by mouth.     ezetimibe (ZETIA) 10 MG tablet Take 1 tablet (10 mg total) by mouth daily. 90 tablet 3   ferrous sulfate 325 (65 FE) MG tablet Take 325 mg by mouth daily.     isosorbide mononitrate (IMDUR) 60 MG 24 hr tablet Take 1 tablet (60 mg total) by mouth daily. 90 tablet 1    losartan (COZAAR) 50 MG tablet Take 2 tablets (100 mg total) by mouth daily. 180 tablet 3   metoprolol succinate (TOPROL-XL) 50 MG 24 hr tablet TAKE 1 TABLET BY MOUTH ONCE DAILY WITH  OR  IMMEDIATELY  FOLLOWING  A  MEAL 90 tablet 0   No current facility-administered medications for this visit.    Allergies:   Aspirin, Tramadol, and Zyrtec [cetirizine]    Social History:   reports that she has been smoking cigarettes. She has a 41.00 pack-year smoking history. She has never used smokeless tobacco. She reports current alcohol use. She reports that she does not use drugs.   Family History:  family history includes Hypertension in her father and mother.    ROS:     Review of Systems  Constitutional: Negative.   HENT: Negative.    Eyes: Negative.   Respiratory: Negative.    Gastrointestinal: Negative.   Genitourinary: Negative.   Musculoskeletal: Negative.   Skin: Negative.   Neurological: Negative.   Endo/Heme/Allergies: Negative.   Psychiatric/Behavioral: Negative.    All other systems reviewed and are negative.     All other systems are reviewed and negative.    PHYSICAL EXAM: VS:  BP (!) 100/58   Pulse 70   Ht 5\' 4"  (1.626 m)   Wt 108 lb (49 kg)   LMP  (LMP Unknown)   SpO2 95%   BMI 18.54 kg/m  , BMI Body mass index is 18.54 kg/m. Last weight:  Wt Readings from Last 3 Encounters:  02/21/23 108 lb (49 kg)  12/31/22 110 lb 3.2 oz (50 kg)  12/10/22 110 lb (49.9 kg)     Physical Exam Constitutional:      Appearance: Normal appearance.  Cardiovascular:     Rate and Rhythm: Normal rate and regular rhythm.     Heart sounds: Normal heart sounds.  Pulmonary:     Effort: Pulmonary effort is normal.     Breath sounds: Normal breath sounds.  Musculoskeletal:     Right lower leg: No edema.     Left lower leg: No edema.  Neurological:     Mental Status: She is alert.       EKG:   Recent Labs: 05/28/2022: ALT 11; BUN 13; Creatinine, Ser 0.98; Hemoglobin 12.7;  Platelets 151; Potassium 4.2; Sodium 140; TSH 1.010    Lipid Panel    Component Value Date/Time   CHOL 149 05/28/2022 1103   CHOL 147 10/16/2013 0439   TRIG 76 05/28/2022 1103   TRIG 122 10/16/2013 0439   HDL 79 05/28/2022 1103   HDL 31 (L) 10/16/2013 0439   CHOLHDL 1.9 05/28/2022 1103   CHOLHDL 1.4 08/06/2021 0539   VLDL 9 08/06/2021 0539   VLDL 24 10/16/2013 0439   LDLCALC 55 05/28/2022 1103   LDLCALC 92 10/16/2013 0439  Other studies Reviewed: Additional studies/ records that were reviewed today include:  Review of the above records demonstrates:       No data to display            ASSESSMENT AND PLAN:    ICD-10-CM   1. Atherosclerosis of native artery of both lower extremities with intermittent claudication (HCC)  I70.213 PCV ECHOCARDIOGRAM COMPLETE    2. Coronary artery disease due to lipid rich plaque  I25.10 PCV ECHOCARDIOGRAM COMPLETE   I25.83    nuclear stress test 3/24 had normal LVEF 58%, no ichaemia. Will do echo prior to surgery    3. Essential hypertension  I10 PCV ECHOCARDIOGRAM COMPLETE    4. Acute exacerbation of chronic obstructive pulmonary disease (COPD) (HCC)  J44.1 PCV ECHOCARDIOGRAM COMPLETE    5. Malignant neoplasm of upper lobe of right lung (HCC)  C34.11 PCV ECHOCARDIOGRAM COMPLETE   Needs to have lobectomy and will check echo.    6. Pre-operative cardiovascular examination  Z01.810    Get echo       Problem List Items Addressed This Visit       Cardiovascular and Mediastinum   Essential hypertension   Relevant Orders   PCV ECHOCARDIOGRAM COMPLETE   Atherosclerosis of native arteries of extremity with intermittent claudication (HCC) - Primary   Relevant Orders   PCV ECHOCARDIOGRAM COMPLETE   Coronary artery disease due to lipid rich plaque   Relevant Orders   PCV ECHOCARDIOGRAM COMPLETE     Respiratory   Acute exacerbation of chronic obstructive pulmonary disease (COPD) (HCC)   Relevant Orders   PCV ECHOCARDIOGRAM  COMPLETE   Other Visit Diagnoses     Malignant neoplasm of upper lobe of right lung (HCC)       Needs to have lobectomy and will check echo.   Relevant Orders   PCV ECHOCARDIOGRAM COMPLETE   Pre-operative cardiovascular examination       Get echo          Disposition:   No follow-ups on file.    Total time spent: 30 minutes  Signed,  Adrian Blackwater, MD  02/21/2023 11:26 AM    [facility]

## 2023-02-27 ENCOUNTER — Ambulatory Visit (INDEPENDENT_AMBULATORY_CARE_PROVIDER_SITE_OTHER): Payer: 59

## 2023-02-27 DIAGNOSIS — C3411 Malignant neoplasm of upper lobe, right bronchus or lung: Secondary | ICD-10-CM

## 2023-02-27 DIAGNOSIS — I2510 Atherosclerotic heart disease of native coronary artery without angina pectoris: Secondary | ICD-10-CM

## 2023-02-27 DIAGNOSIS — I70213 Atherosclerosis of native arteries of extremities with intermittent claudication, bilateral legs: Secondary | ICD-10-CM

## 2023-02-27 DIAGNOSIS — I361 Nonrheumatic tricuspid (valve) insufficiency: Secondary | ICD-10-CM

## 2023-02-27 DIAGNOSIS — J441 Chronic obstructive pulmonary disease with (acute) exacerbation: Secondary | ICD-10-CM

## 2023-02-27 DIAGNOSIS — I10 Essential (primary) hypertension: Secondary | ICD-10-CM

## 2023-02-28 ENCOUNTER — Other Ambulatory Visit: Payer: 59

## 2023-03-04 LAB — PULMONARY FUNCTION TEST

## 2023-03-05 ENCOUNTER — Encounter: Payer: Self-pay | Admitting: Cardiovascular Disease

## 2023-03-05 ENCOUNTER — Ambulatory Visit (INDEPENDENT_AMBULATORY_CARE_PROVIDER_SITE_OTHER): Payer: 59 | Admitting: Cardiovascular Disease

## 2023-03-05 VITALS — BP 148/62 | HR 72 | Ht 64.0 in | Wt 107.8 lb

## 2023-03-05 DIAGNOSIS — I998 Other disorder of circulatory system: Secondary | ICD-10-CM | POA: Diagnosis not present

## 2023-03-05 DIAGNOSIS — I70213 Atherosclerosis of native arteries of extremities with intermittent claudication, bilateral legs: Secondary | ICD-10-CM

## 2023-03-05 DIAGNOSIS — I2583 Coronary atherosclerosis due to lipid rich plaque: Secondary | ICD-10-CM | POA: Diagnosis not present

## 2023-03-05 DIAGNOSIS — I2510 Atherosclerotic heart disease of native coronary artery without angina pectoris: Secondary | ICD-10-CM | POA: Diagnosis not present

## 2023-03-05 DIAGNOSIS — F17209 Nicotine dependence, unspecified, with unspecified nicotine-induced disorders: Secondary | ICD-10-CM

## 2023-03-05 DIAGNOSIS — I10 Essential (primary) hypertension: Secondary | ICD-10-CM

## 2023-03-05 NOTE — Progress Notes (Signed)
Cardiology Office Note   Date:  03/05/2023   ID:  [name], DOB 01/27/1956, MRN [id number]  PCP:  Sallyanne Kuster, NP  Cardiologist:  Adrian Blackwater, MD      History of Present Illness: [name] is a 67 y.o. female who presents for  Chief Complaint  Patient presents with   Follow-up    Echo F/U    BP high as did not take Am meds yet      Past Medical History:  Diagnosis Date   Allergy    Environmental   Arthritis    Asthma    Carotid artery stenosis    Carotid stenosis    COPD (chronic obstructive pulmonary disease) (HCC)    Coronary artery disease    Coronary artery disease    Hemorrhoids    Hyperlipidemia    Hypertension    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(HCC)    negative cardiac cath   Nicotine addiction    Peripheral vascular disease (HCC)    Pneumonia    Shortness of breath dyspnea    Tobacco abuse      Past Surgical History:  Procedure Laterality Date   ABDOMINAL HYSTERECTOMY     APPENDECTOMY     CARDIAC CATHETERIZATION     CARDIAC SURGERY     CHOLECYSTECTOMY     COLONOSCOPY  12/15/11   OH->bleeding internal hemorrhoids, otherwise normal   COLONOSCOPY WITH PROPOFOL N/A 04/12/2015   Procedure: COLONOSCOPY WITH PROPOFOL;  Surgeon: Midge Minium, MD;  Location: [facility] ENDOSCOPY;  Service: Endoscopy;  Laterality: N/A;   COLONOSCOPY WITH PROPOFOL N/A 01/05/2020   Procedure: COLONOSCOPY WITH PROPOFOL;  Surgeon: Midge Minium, MD;  Location: [facility] ENDOSCOPY;  Service: Endoscopy;  Laterality: N/A;   ENDARTERECTOMY FEMORAL Left 04/26/2015   Procedure: ENDARTERECTOMY FEMORAL;  Surgeon: Annice Needy, MD;  Location: [facility] ORS;  Service: Vascular;  Laterality: Left;   ENDOVASCULAR STENT INSERTION Bilateral    Legs   FLEXIBLE BRONCHOSCOPY N/A 02/02/2015   Procedure: FLEXIBLE BRONCHOSCOPY;  Surgeon: Yevonne Pax, MD;  Location: [facility] ORS;  Service: Pulmonary;  Laterality: N/A;   HEMORRHOID SURGERY     PERIPHERAL VASCULAR CATHETERIZATION Left 04/25/2015   Procedure: Lower  Extremity Angiography;  Surgeon: Annice Needy, MD;  Location: [facility] CV [facility];  Service: Cardiovascular;  Laterality: Left;   PERIPHERAL VASCULAR CATHETERIZATION Left 04/25/2015   Procedure: Lower Extremity Intervention;  Surgeon: Annice Needy, MD;  Location: [facility];  Service: Cardiovascular;  Laterality: Left;   PERIPHERAL VASCULAR CATHETERIZATION Left 01/02/2016   Procedure: Lower Extremity Angiography;  Surgeon: Annice Needy, MD;  Location: [facility];  Service: Cardiovascular;  Laterality: Left;   PERIPHERAL VASCULAR CATHETERIZATION  01/02/2016   Procedure: Lower Extremity Intervention;  Surgeon: Annice Needy, MD;  Location: [facility];  Service: Cardiovascular;;   THROMBECTOMY FEMORAL ARTERY Left 04/26/2015   Procedure: THROMBECTOMY FEMORAL ARTERY;  Surgeon: Annice Needy, MD;  Location: [facility] OR[facility];  Service: Vascular;  Laterality: Left;     Current Outpatient Medications  Medication Sig Dispense Refill   albuterol (VENTOLIN HFA) 108 (90 Base) MCG/ACT inhaler INHALE 2 PUFFS BY MOUTH EVERY 6 HOURS AS NEEDED FOR WHEEZING FOR SHORTNESS OF BREATH 9 g 0   aspirin EC 81 MG EC tablet Take 1 tablet (81 mg total) by mouth daily. 90 tablet 1   atorvastatin (LIPITOR) 80 MG tablet Take 1 tablet (80 mg total) by mouth daily. 90 tablet 3   budesonide-formoterol (SYMBICORT) 80-4.5  MCG/ACT inhaler INHALE 2 PUFFS BY MOUTH ONCE DAILY 22 g 2   butalbital-acetaminophen-caffeine (FIORICET) 50-325-40 MG tablet Take 1 tablet by mouth every 6 (six) hours as needed for headache. 14 tablet 4   cholecalciferol (VITAMIN D) 400 units TABS tablet Take 400 Units by mouth.     ezetimibe (ZETIA) 10 MG tablet Take 1 tablet (10 mg total) by mouth daily. 90 tablet 3   ferrous sulfate 325 (65 FE) MG tablet Take 325 mg by mouth daily.     isosorbide mononitrate (IMDUR) 60 MG 24 hr tablet Take 1 tablet (60 mg total) by mouth daily. 90 tablet 1   losartan (COZAAR) 50 MG tablet Take 2 tablets (100 mg  total) by mouth daily. 180 tablet 3   metoprolol succinate (TOPROL-XL) 50 MG 24 hr tablet TAKE 1 TABLET BY MOUTH ONCE DAILY WITH  OR  IMMEDIATELY  FOLLOWING  A  MEAL 90 tablet 0   No current facility-administered medications for this visit.    Allergies:   Aspirin, Tramadol, and Zyrtec [cetirizine]    Social History:   reports that she has been smoking cigarettes. She has a 41.00 pack-year smoking history. She has never used smokeless tobacco. She reports current alcohol use. She reports that she does not use drugs.   Family History:  family history includes Hypertension in her father and mother.    ROS:     Review of Systems  Constitutional: Negative.   HENT: Negative.    Eyes: Negative.   Respiratory: Negative.    Gastrointestinal: Negative.   Genitourinary: Negative.   Musculoskeletal: Negative.   Skin: Negative.   Neurological: Negative.   Endo/Heme/Allergies: Negative.   Psychiatric/Behavioral: Negative.    All other systems reviewed and are negative.     All other systems are reviewed and negative.    PHYSICAL EXAM: VS:  BP (!) 148/62   Pulse 72   Ht 5\' 4"  (1.626 m)   Wt 107 lb 12.8 oz (48.9 kg)   LMP  (LMP Unknown)   SpO2 99%   BMI 18.50 kg/m  , BMI Body mass index is 18.5 kg/m. Last weight:  Wt Readings from Last 3 Encounters:  03/05/23 107 lb 12.8 oz (48.9 kg)  02/21/23 108 lb (49 kg)  12/31/22 110 lb 3.2 oz (50 kg)     Physical Exam Constitutional:      Appearance: Normal appearance.  Cardiovascular:     Rate and Rhythm: Normal rate and regular rhythm.     Heart sounds: Normal heart sounds.  Pulmonary:     Effort: Pulmonary effort is normal.     Breath sounds: Normal breath sounds.  Musculoskeletal:     Right lower leg: No edema.     Left lower leg: No edema.  Neurological:     Mental Status: She is alert.       EKG:   Recent Labs: 05/28/2022: ALT 11; BUN 13; Creatinine, Ser 0.98; Hemoglobin 12.7; Platelets 151; Potassium 4.2; Sodium  140; TSH 1.010    Lipid Panel    Component Value Date/Time   CHOL 149 05/28/2022 1103   CHOL 147 10/16/2013 0439   TRIG 76 05/28/2022 1103   TRIG 122 10/16/2013 0439   HDL 79 05/28/2022 1103   HDL 31 (L) 10/16/2013 0439   CHOLHDL 1.9 05/28/2022 1103   CHOLHDL 1.4 08/06/2021 0539   VLDL 9 08/06/2021 0539   VLDL 24 10/16/2013 0439   LDLCALC 55 05/28/2022 1103   LDLCALC 92 10/16/2013 0439  Other studies Reviewed: Additional studies/ records that were reviewed today include:  Review of the above records demonstrates:       No data to display            ASSESSMENT AND PLAN:    ICD-10-CM   1. Atherosclerosis of native artery of both lower extremities with intermittent claudication (HCC)  I70.213    No chest pain    2. Coronary artery disease due to lipid rich plaque  I25.10    I25.83     3. Essential hypertension  I10    Did not take Am meds yet. advise take meds prior to arrival for office visit.    4. Ischemic leg  I99.8    Advised proceeding with surgery       Problem List Items Addressed This Visit       Cardiovascular and Mediastinum   Essential hypertension   Ischemic leg   Atherosclerosis of native arteries of extremity with intermittent claudication (HCC) - Primary   Coronary artery disease due to lipid rich plaque       Disposition:   Return in about 6 weeks (around 04/16/2023).    Total time spent: 30 minutes  Signed,  Adrian Blackwater, MD  03/05/2023 10:49 AM    [facility]

## 2023-03-06 DIAGNOSIS — H2512 Age-related nuclear cataract, left eye: Secondary | ICD-10-CM | POA: Diagnosis not present

## 2023-03-06 DIAGNOSIS — H47393 Other disorders of optic disc, bilateral: Secondary | ICD-10-CM | POA: Diagnosis not present

## 2023-03-06 DIAGNOSIS — H2511 Age-related nuclear cataract, right eye: Secondary | ICD-10-CM | POA: Diagnosis not present

## 2023-03-06 DIAGNOSIS — H40003 Preglaucoma, unspecified, bilateral: Secondary | ICD-10-CM | POA: Diagnosis not present

## 2023-03-06 DIAGNOSIS — H52221 Regular astigmatism, right eye: Secondary | ICD-10-CM | POA: Diagnosis not present

## 2023-03-06 DIAGNOSIS — H5203 Hypermetropia, bilateral: Secondary | ICD-10-CM | POA: Diagnosis not present

## 2023-03-13 ENCOUNTER — Other Ambulatory Visit (INDEPENDENT_AMBULATORY_CARE_PROVIDER_SITE_OTHER): Payer: Self-pay | Admitting: Vascular Surgery

## 2023-03-13 DIAGNOSIS — I70219 Atherosclerosis of native arteries of extremities with intermittent claudication, unspecified extremity: Secondary | ICD-10-CM

## 2023-03-13 DIAGNOSIS — I6523 Occlusion and stenosis of bilateral carotid arteries: Secondary | ICD-10-CM

## 2023-03-15 DIAGNOSIS — J449 Chronic obstructive pulmonary disease, unspecified: Secondary | ICD-10-CM | POA: Diagnosis not present

## 2023-03-15 DIAGNOSIS — I2510 Atherosclerotic heart disease of native coronary artery without angina pectoris: Secondary | ICD-10-CM | POA: Diagnosis not present

## 2023-03-15 DIAGNOSIS — I219 Acute myocardial infarction, unspecified: Secondary | ICD-10-CM | POA: Diagnosis not present

## 2023-03-15 DIAGNOSIS — K219 Gastro-esophageal reflux disease without esophagitis: Secondary | ICD-10-CM | POA: Diagnosis not present

## 2023-03-15 DIAGNOSIS — Z01818 Encounter for other preprocedural examination: Secondary | ICD-10-CM | POA: Diagnosis not present

## 2023-03-15 DIAGNOSIS — Z720 Tobacco use: Secondary | ICD-10-CM | POA: Diagnosis not present

## 2023-03-15 DIAGNOSIS — R911 Solitary pulmonary nodule: Secondary | ICD-10-CM | POA: Diagnosis not present

## 2023-03-20 DIAGNOSIS — Z7951 Long term (current) use of inhaled steroids: Secondary | ICD-10-CM | POA: Diagnosis not present

## 2023-03-20 DIAGNOSIS — I2510 Atherosclerotic heart disease of native coronary artery without angina pectoris: Secondary | ICD-10-CM | POA: Diagnosis not present

## 2023-03-20 DIAGNOSIS — E785 Hyperlipidemia, unspecified: Secondary | ICD-10-CM | POA: Diagnosis not present

## 2023-03-20 DIAGNOSIS — R42 Dizziness and giddiness: Secondary | ICD-10-CM | POA: Diagnosis not present

## 2023-03-20 DIAGNOSIS — K625 Hemorrhage of anus and rectum: Secondary | ICD-10-CM | POA: Diagnosis not present

## 2023-03-20 DIAGNOSIS — G8918 Other acute postprocedural pain: Secondary | ICD-10-CM | POA: Diagnosis not present

## 2023-03-20 DIAGNOSIS — R911 Solitary pulmonary nodule: Secondary | ICD-10-CM | POA: Diagnosis not present

## 2023-03-20 DIAGNOSIS — R109 Unspecified abdominal pain: Secondary | ICD-10-CM | POA: Diagnosis not present

## 2023-03-20 DIAGNOSIS — I422 Other hypertrophic cardiomyopathy: Secondary | ICD-10-CM | POA: Diagnosis not present

## 2023-03-20 DIAGNOSIS — R1031 Right lower quadrant pain: Secondary | ICD-10-CM | POA: Diagnosis not present

## 2023-03-20 DIAGNOSIS — F17210 Nicotine dependence, cigarettes, uncomplicated: Secondary | ICD-10-CM | POA: Diagnosis not present

## 2023-03-20 DIAGNOSIS — Z4682 Encounter for fitting and adjustment of non-vascular catheter: Secondary | ICD-10-CM | POA: Diagnosis not present

## 2023-03-20 DIAGNOSIS — K922 Gastrointestinal hemorrhage, unspecified: Secondary | ICD-10-CM | POA: Diagnosis not present

## 2023-03-20 DIAGNOSIS — R1032 Left lower quadrant pain: Secondary | ICD-10-CM | POA: Diagnosis not present

## 2023-03-20 DIAGNOSIS — J9811 Atelectasis: Secondary | ICD-10-CM | POA: Diagnosis not present

## 2023-03-20 DIAGNOSIS — R9431 Abnormal electrocardiogram [ECG] [EKG]: Secondary | ICD-10-CM | POA: Diagnosis not present

## 2023-03-20 DIAGNOSIS — J449 Chronic obstructive pulmonary disease, unspecified: Secondary | ICD-10-CM | POA: Diagnosis not present

## 2023-03-20 DIAGNOSIS — K429 Umbilical hernia without obstruction or gangrene: Secondary | ICD-10-CM | POA: Diagnosis not present

## 2023-03-20 DIAGNOSIS — Z9889 Other specified postprocedural states: Secondary | ICD-10-CM | POA: Diagnosis not present

## 2023-03-20 DIAGNOSIS — I959 Hypotension, unspecified: Secondary | ICD-10-CM | POA: Diagnosis not present

## 2023-03-20 DIAGNOSIS — D62 Acute posthemorrhagic anemia: Secondary | ICD-10-CM | POA: Diagnosis not present

## 2023-03-20 DIAGNOSIS — J439 Emphysema, unspecified: Secondary | ICD-10-CM | POA: Diagnosis not present

## 2023-03-20 DIAGNOSIS — C3411 Malignant neoplasm of upper lobe, right bronchus or lung: Secondary | ICD-10-CM | POA: Diagnosis not present

## 2023-03-20 DIAGNOSIS — D649 Anemia, unspecified: Secondary | ICD-10-CM | POA: Diagnosis not present

## 2023-03-20 DIAGNOSIS — I071 Rheumatic tricuspid insufficiency: Secondary | ICD-10-CM | POA: Diagnosis not present

## 2023-03-20 DIAGNOSIS — K838 Other specified diseases of biliary tract: Secondary | ICD-10-CM | POA: Diagnosis not present

## 2023-03-20 DIAGNOSIS — K5900 Constipation, unspecified: Secondary | ICD-10-CM | POA: Diagnosis not present

## 2023-03-20 DIAGNOSIS — I272 Pulmonary hypertension, unspecified: Secondary | ICD-10-CM | POA: Diagnosis not present

## 2023-03-20 DIAGNOSIS — K644 Residual hemorrhoidal skin tags: Secondary | ICD-10-CM | POA: Diagnosis not present

## 2023-03-20 DIAGNOSIS — R079 Chest pain, unspecified: Secondary | ICD-10-CM | POA: Diagnosis not present

## 2023-03-20 DIAGNOSIS — I10 Essential (primary) hypertension: Secondary | ICD-10-CM | POA: Diagnosis not present

## 2023-03-20 DIAGNOSIS — I700 Atherosclerosis of aorta: Secondary | ICD-10-CM | POA: Diagnosis not present

## 2023-03-20 DIAGNOSIS — K648 Other hemorrhoids: Secondary | ICD-10-CM | POA: Diagnosis not present

## 2023-03-20 DIAGNOSIS — K921 Melena: Secondary | ICD-10-CM | POA: Diagnosis not present

## 2023-03-20 DIAGNOSIS — K219 Gastro-esophageal reflux disease without esophagitis: Secondary | ICD-10-CM | POA: Diagnosis not present

## 2023-03-20 DIAGNOSIS — I739 Peripheral vascular disease, unspecified: Secondary | ICD-10-CM | POA: Diagnosis not present

## 2023-03-20 DIAGNOSIS — R918 Other nonspecific abnormal finding of lung field: Secondary | ICD-10-CM | POA: Diagnosis not present

## 2023-03-21 DIAGNOSIS — I272 Pulmonary hypertension, unspecified: Secondary | ICD-10-CM

## 2023-03-21 HISTORY — DX: Pulmonary hypertension, unspecified: I27.20

## 2023-03-24 DIAGNOSIS — K922 Gastrointestinal hemorrhage, unspecified: Secondary | ICD-10-CM

## 2023-03-24 HISTORY — DX: Gastrointestinal hemorrhage, unspecified: K92.2

## 2023-03-26 ENCOUNTER — Ambulatory Visit (INDEPENDENT_AMBULATORY_CARE_PROVIDER_SITE_OTHER): Payer: 59 | Admitting: Vascular Surgery

## 2023-03-26 ENCOUNTER — Encounter (INDEPENDENT_AMBULATORY_CARE_PROVIDER_SITE_OTHER): Payer: 59

## 2023-03-27 ENCOUNTER — Telehealth: Payer: Self-pay | Admitting: Nurse Practitioner

## 2023-03-27 DIAGNOSIS — R911 Solitary pulmonary nodule: Secondary | ICD-10-CM | POA: Diagnosis not present

## 2023-03-27 NOTE — Telephone Encounter (Signed)
[facility] called for verbal OT/PT order. Tat took the call-Toni

## 2023-03-28 DIAGNOSIS — R911 Solitary pulmonary nodule: Secondary | ICD-10-CM | POA: Diagnosis not present

## 2023-03-29 DIAGNOSIS — E782 Mixed hyperlipidemia: Secondary | ICD-10-CM | POA: Diagnosis not present

## 2023-03-29 DIAGNOSIS — C3411 Malignant neoplasm of upper lobe, right bronchus or lung: Secondary | ICD-10-CM | POA: Diagnosis not present

## 2023-03-29 DIAGNOSIS — I10 Essential (primary) hypertension: Secondary | ICD-10-CM | POA: Diagnosis not present

## 2023-03-29 DIAGNOSIS — Z9049 Acquired absence of other specified parts of digestive tract: Secondary | ICD-10-CM | POA: Diagnosis not present

## 2023-03-29 DIAGNOSIS — Z7951 Long term (current) use of inhaled steroids: Secondary | ICD-10-CM | POA: Diagnosis not present

## 2023-03-29 DIAGNOSIS — D62 Acute posthemorrhagic anemia: Secondary | ICD-10-CM | POA: Diagnosis not present

## 2023-03-29 DIAGNOSIS — I2510 Atherosclerotic heart disease of native coronary artery without angina pectoris: Secondary | ICD-10-CM | POA: Diagnosis not present

## 2023-03-29 DIAGNOSIS — K921 Melena: Secondary | ICD-10-CM | POA: Diagnosis not present

## 2023-03-29 DIAGNOSIS — Z79899 Other long term (current) drug therapy: Secondary | ICD-10-CM | POA: Diagnosis not present

## 2023-03-29 DIAGNOSIS — K648 Other hemorrhoids: Secondary | ICD-10-CM | POA: Diagnosis not present

## 2023-03-29 DIAGNOSIS — I739 Peripheral vascular disease, unspecified: Secondary | ICD-10-CM | POA: Diagnosis not present

## 2023-03-29 DIAGNOSIS — D649 Anemia, unspecified: Secondary | ICD-10-CM | POA: Diagnosis not present

## 2023-03-29 DIAGNOSIS — J432 Centrilobular emphysema: Secondary | ICD-10-CM | POA: Diagnosis not present

## 2023-03-29 DIAGNOSIS — Z556 Problems related to health literacy: Secondary | ICD-10-CM | POA: Diagnosis not present

## 2023-03-29 DIAGNOSIS — F17210 Nicotine dependence, cigarettes, uncomplicated: Secondary | ICD-10-CM | POA: Diagnosis not present

## 2023-03-29 DIAGNOSIS — I252 Old myocardial infarction: Secondary | ICD-10-CM | POA: Diagnosis not present

## 2023-03-29 DIAGNOSIS — Z483 Aftercare following surgery for neoplasm: Secondary | ICD-10-CM | POA: Diagnosis not present

## 2023-03-30 DIAGNOSIS — E785 Hyperlipidemia, unspecified: Secondary | ICD-10-CM | POA: Diagnosis not present

## 2023-03-30 DIAGNOSIS — R9431 Abnormal electrocardiogram [ECG] [EKG]: Secondary | ICD-10-CM | POA: Diagnosis not present

## 2023-03-30 DIAGNOSIS — R42 Dizziness and giddiness: Secondary | ICD-10-CM | POA: Diagnosis not present

## 2023-03-30 DIAGNOSIS — R4701 Aphasia: Secondary | ICD-10-CM | POA: Diagnosis not present

## 2023-03-30 DIAGNOSIS — I739 Peripheral vascular disease, unspecified: Secondary | ICD-10-CM | POA: Diagnosis not present

## 2023-03-30 DIAGNOSIS — F17210 Nicotine dependence, cigarettes, uncomplicated: Secondary | ICD-10-CM | POA: Diagnosis not present

## 2023-03-30 DIAGNOSIS — J449 Chronic obstructive pulmonary disease, unspecified: Secondary | ICD-10-CM | POA: Diagnosis not present

## 2023-03-30 DIAGNOSIS — I10 Essential (primary) hypertension: Secondary | ICD-10-CM | POA: Diagnosis not present

## 2023-03-30 DIAGNOSIS — Z79899 Other long term (current) drug therapy: Secondary | ICD-10-CM | POA: Diagnosis not present

## 2023-03-30 DIAGNOSIS — R55 Syncope and collapse: Secondary | ICD-10-CM | POA: Diagnosis not present

## 2023-03-30 DIAGNOSIS — R7401 Elevation of levels of liver transaminase levels: Secondary | ICD-10-CM | POA: Diagnosis not present

## 2023-03-30 DIAGNOSIS — K5900 Constipation, unspecified: Secondary | ICD-10-CM | POA: Diagnosis not present

## 2023-03-30 DIAGNOSIS — Z886 Allergy status to analgesic agent status: Secondary | ICD-10-CM | POA: Diagnosis not present

## 2023-03-30 DIAGNOSIS — M549 Dorsalgia, unspecified: Secondary | ICD-10-CM | POA: Diagnosis not present

## 2023-03-30 DIAGNOSIS — I252 Old myocardial infarction: Secondary | ICD-10-CM | POA: Diagnosis not present

## 2023-03-30 DIAGNOSIS — Z888 Allergy status to other drugs, medicaments and biological substances status: Secondary | ICD-10-CM | POA: Diagnosis not present

## 2023-03-30 DIAGNOSIS — R1084 Generalized abdominal pain: Secondary | ICD-10-CM | POA: Diagnosis not present

## 2023-03-30 DIAGNOSIS — I2510 Atherosclerotic heart disease of native coronary artery without angina pectoris: Secondary | ICD-10-CM | POA: Diagnosis not present

## 2023-03-30 DIAGNOSIS — M545 Low back pain, unspecified: Secondary | ICD-10-CM | POA: Diagnosis not present

## 2023-03-30 DIAGNOSIS — R109 Unspecified abdominal pain: Secondary | ICD-10-CM | POA: Diagnosis not present

## 2023-03-30 DIAGNOSIS — R1030 Lower abdominal pain, unspecified: Secondary | ICD-10-CM | POA: Diagnosis not present

## 2023-04-02 ENCOUNTER — Telehealth: Payer: Self-pay | Admitting: Nurse Practitioner

## 2023-04-02 NOTE — Telephone Encounter (Signed)
Received [facility] order for AA signature. [facility] order signed. Faxed back; [phone]. To be scanned-nm

## 2023-04-05 ENCOUNTER — Ambulatory Visit: Payer: 59 | Admitting: Nurse Practitioner

## 2023-04-08 DIAGNOSIS — K635 Polyp of colon: Secondary | ICD-10-CM | POA: Diagnosis not present

## 2023-04-08 DIAGNOSIS — J449 Chronic obstructive pulmonary disease, unspecified: Secondary | ICD-10-CM | POA: Diagnosis not present

## 2023-04-08 DIAGNOSIS — I2510 Atherosclerotic heart disease of native coronary artery without angina pectoris: Secondary | ICD-10-CM | POA: Diagnosis not present

## 2023-04-08 DIAGNOSIS — F17210 Nicotine dependence, cigarettes, uncomplicated: Secondary | ICD-10-CM | POA: Diagnosis not present

## 2023-04-08 DIAGNOSIS — I10 Essential (primary) hypertension: Secondary | ICD-10-CM | POA: Diagnosis not present

## 2023-04-08 DIAGNOSIS — K6289 Other specified diseases of anus and rectum: Secondary | ICD-10-CM | POA: Diagnosis not present

## 2023-04-08 DIAGNOSIS — K269 Duodenal ulcer, unspecified as acute or chronic, without hemorrhage or perforation: Secondary | ICD-10-CM | POA: Diagnosis not present

## 2023-04-08 DIAGNOSIS — K3189 Other diseases of stomach and duodenum: Secondary | ICD-10-CM | POA: Diagnosis not present

## 2023-04-08 DIAGNOSIS — D509 Iron deficiency anemia, unspecified: Secondary | ICD-10-CM | POA: Diagnosis not present

## 2023-04-08 DIAGNOSIS — K219 Gastro-esophageal reflux disease without esophagitis: Secondary | ICD-10-CM | POA: Diagnosis not present

## 2023-04-08 DIAGNOSIS — K649 Unspecified hemorrhoids: Secondary | ICD-10-CM | POA: Diagnosis not present

## 2023-04-08 DIAGNOSIS — Z7982 Long term (current) use of aspirin: Secondary | ICD-10-CM | POA: Diagnosis not present

## 2023-04-08 DIAGNOSIS — K602 Anal fissure, unspecified: Secondary | ICD-10-CM | POA: Diagnosis not present

## 2023-04-08 DIAGNOSIS — D122 Benign neoplasm of ascending colon: Secondary | ICD-10-CM | POA: Diagnosis not present

## 2023-04-08 DIAGNOSIS — K449 Diaphragmatic hernia without obstruction or gangrene: Secondary | ICD-10-CM | POA: Diagnosis not present

## 2023-04-08 DIAGNOSIS — Z79899 Other long term (current) drug therapy: Secondary | ICD-10-CM | POA: Diagnosis not present

## 2023-04-08 DIAGNOSIS — D123 Benign neoplasm of transverse colon: Secondary | ICD-10-CM | POA: Diagnosis not present

## 2023-04-08 DIAGNOSIS — E785 Hyperlipidemia, unspecified: Secondary | ICD-10-CM | POA: Diagnosis not present

## 2023-04-08 DIAGNOSIS — I252 Old myocardial infarction: Secondary | ICD-10-CM | POA: Diagnosis not present

## 2023-04-08 DIAGNOSIS — K922 Gastrointestinal hemorrhage, unspecified: Secondary | ICD-10-CM | POA: Diagnosis not present

## 2023-04-08 DIAGNOSIS — D369 Benign neoplasm, unspecified site: Secondary | ICD-10-CM | POA: Diagnosis not present

## 2023-04-08 DIAGNOSIS — Z7951 Long term (current) use of inhaled steroids: Secondary | ICD-10-CM | POA: Diagnosis not present

## 2023-04-08 DIAGNOSIS — K921 Melena: Secondary | ICD-10-CM | POA: Diagnosis not present

## 2023-04-08 DIAGNOSIS — Z885 Allergy status to narcotic agent status: Secondary | ICD-10-CM | POA: Diagnosis not present

## 2023-04-09 DIAGNOSIS — Z08 Encounter for follow-up examination after completed treatment for malignant neoplasm: Secondary | ICD-10-CM | POA: Diagnosis not present

## 2023-04-09 DIAGNOSIS — Z85118 Personal history of other malignant neoplasm of bronchus and lung: Secondary | ICD-10-CM | POA: Diagnosis not present

## 2023-04-09 DIAGNOSIS — I6523 Occlusion and stenosis of bilateral carotid arteries: Secondary | ICD-10-CM | POA: Diagnosis not present

## 2023-04-09 DIAGNOSIS — Z9889 Other specified postprocedural states: Secondary | ICD-10-CM | POA: Diagnosis not present

## 2023-04-09 DIAGNOSIS — C3411 Malignant neoplasm of upper lobe, right bronchus or lung: Secondary | ICD-10-CM | POA: Diagnosis present

## 2023-04-09 DIAGNOSIS — J439 Emphysema, unspecified: Secondary | ICD-10-CM | POA: Diagnosis not present

## 2023-04-09 DIAGNOSIS — R911 Solitary pulmonary nodule: Secondary | ICD-10-CM | POA: Diagnosis not present

## 2023-04-10 ENCOUNTER — Encounter: Payer: Self-pay | Admitting: Nurse Practitioner

## 2023-04-10 ENCOUNTER — Ambulatory Visit (INDEPENDENT_AMBULATORY_CARE_PROVIDER_SITE_OTHER): Payer: 59 | Admitting: Nurse Practitioner

## 2023-04-10 VITALS — BP 105/80 | HR 69 | Temp 97.0°F | Resp 16 | Ht 64.0 in | Wt 109.6 lb

## 2023-04-10 DIAGNOSIS — R630 Anorexia: Secondary | ICD-10-CM

## 2023-04-10 DIAGNOSIS — I2510 Atherosclerotic heart disease of native coronary artery without angina pectoris: Secondary | ICD-10-CM | POA: Diagnosis not present

## 2023-04-10 DIAGNOSIS — Z760 Encounter for issue of repeat prescription: Secondary | ICD-10-CM

## 2023-04-10 DIAGNOSIS — Z79899 Other long term (current) drug therapy: Secondary | ICD-10-CM

## 2023-04-10 DIAGNOSIS — G479 Sleep disorder, unspecified: Secondary | ICD-10-CM | POA: Diagnosis not present

## 2023-04-10 DIAGNOSIS — I2583 Coronary atherosclerosis due to lipid rich plaque: Secondary | ICD-10-CM

## 2023-04-10 MED ORDER — MIRTAZAPINE 15 MG PO TABS: 15.0000 mg | ORAL_TABLET | Freq: Every day | ORAL | 3 refills | Status: DC

## 2023-04-10 MED ORDER — EZETIMIBE 10 MG PO TABS: 10.0000 mg | ORAL_TABLET | Freq: Every day | ORAL | 3 refills | Status: DC

## 2023-04-10 MED ORDER — ATORVASTATIN CALCIUM 80 MG PO TABS
80.0000 mg | ORAL_TABLET | Freq: Every day | ORAL | 3 refills | Status: DC
Start: 2023-04-10 — End: 2024-01-13

## 2023-04-10 MED ORDER — LOSARTAN POTASSIUM 50 MG PO TABS
100.0000 mg | ORAL_TABLET | Freq: Every day | ORAL | 3 refills | Status: DC
Start: 2023-04-10 — End: 2024-01-13

## 2023-04-10 MED ORDER — OXYCODONE HCL 5 MG PO TABS
2.5000 mg | ORAL_TABLET | Freq: Four times a day (QID) | ORAL | 0 refills | Status: DC | PRN
Start: 2023-04-10 — End: 2023-06-20

## 2023-04-10 NOTE — Progress Notes (Signed)
[facility] [address] [place], [place] [address]  Internal MEDICINE  Office Visit Note  Patient Name: [name]  [id number]  [id number]  Date of Service: 04/10/2023  Chief Complaint  Patient presents with   Hypertension   Hyperlipidemia   Follow-up    Ed f/u syncope.     HPI [name] presents for a follow-up visit for hypertension, CAD, chronic pain, refills.  Had segmentectomy to remove lung cancer tumor removed-- report states 100 % removed.  Home health physical therapy and/or OT was recommended by [facility] after her surgery. Patient is alert and oriented and physically capable of caring for herself. She has refused the recommended home health and physical therapy. She states that she will call the office if she needs any assistance.  Hypertension -- BP well controlled with current medications.  Working on quitting smoking. Taking chantix    Current Medication: Outpatient Encounter Medications as of 04/10/2023  Medication Sig   albuterol (VENTOLIN HFA) 108 (90 Base) MCG/ACT inhaler INHALE 2 PUFFS BY MOUTH EVERY 6 HOURS AS NEEDED FOR WHEEZING FOR SHORTNESS OF BREATH   aspirin EC 81 MG EC tablet Take 1 tablet (81 mg total) by mouth daily.   budesonide-formoterol (SYMBICORT) 80-4.5 MCG/ACT inhaler INHALE 2 PUFFS BY MOUTH ONCE DAILY   butalbital-acetaminophen-caffeine (FIORICET) 50-325-40 MG tablet Take 1 tablet by mouth every 6 (six) hours as needed for headache.   cholecalciferol (VITAMIN D) 400 units TABS tablet Take 400 Units by mouth.   ferrous sulfate 325 (65 FE) MG tablet Take 325 mg by mouth daily.   gabapentin (NEURONTIN) 100 MG capsule Take by mouth.   metoprolol succinate (TOPROL-XL) 50 MG 24 hr tablet TAKE 1 TABLET BY MOUTH ONCE DAILY WITH  OR  IMMEDIATELY  FOLLOWING  A  MEAL   mirtazapine (REMERON) 15 MG tablet Take 1 tablet (15 mg total) by mouth at bedtime.   Varenicline Tartrate, Starter, 0.5 MG X 11 & 1 MG X 42 TBPK Take one 0.5mg  tab once daily for 3  days,then increase to one 0.5mg  tab twice daily for 4 days,then increase to one 1mg  tab twice daily.   [DISCONTINUED] atorvastatin (LIPITOR) 80 MG tablet Take 1 tablet (80 mg total) by mouth daily.   [DISCONTINUED] ezetimibe (ZETIA) 10 MG tablet Take 1 tablet (10 mg total) by mouth daily.   [DISCONTINUED] isosorbide mononitrate (IMDUR) 60 MG 24 hr tablet Take 1 tablet (60 mg total) by mouth daily.   [DISCONTINUED] losartan (COZAAR) 50 MG tablet Take 2 tablets (100 mg total) by mouth daily.   [DISCONTINUED] oxyCODONE (OXY IR/ROXICODONE) 5 MG immediate release tablet Take 2.5 mg by mouth every 6 (six) hours as needed.   atorvastatin (LIPITOR) 80 MG tablet Take 1 tablet (80 mg total) by mouth daily.   ezetimibe (ZETIA) 10 MG tablet Take 1 tablet (10 mg total) by mouth daily.   losartan (COZAAR) 50 MG tablet Take 2 tablets (100 mg total) by mouth daily.   oxyCODONE (OXY IR/ROXICODONE) 5 MG immediate release tablet Take 0.5 tablets (2.5 mg total) by mouth every 6 (six) hours as needed for moderate pain or severe pain.   No facility-administered encounter medications on file as of 04/10/2023.    Surgical History: Past Surgical History:  Procedure Laterality Date   ABDOMINAL HYSTERECTOMY     APPENDECTOMY     CARDIAC CATHETERIZATION     CARDIAC SURGERY     CHOLECYSTECTOMY     COLONOSCOPY  12/15/11   OH->bleeding internal hemorrhoids, otherwise normal  COLONOSCOPY WITH PROPOFOL N/A 04/12/2015   Procedure: COLONOSCOPY WITH PROPOFOL;  Surgeon: Midge Minium, MD;  Location: [facility] ENDOSCOPY;  Service: Endoscopy;  Laterality: N/A;   COLONOSCOPY WITH PROPOFOL N/A 01/05/2020   Procedure: COLONOSCOPY WITH PROPOFOL;  Surgeon: Midge Minium, MD;  Location: [facility] ENDOSCOPY;  Service: Endoscopy;  Laterality: N/A;   ENDARTERECTOMY FEMORAL Left 04/26/2015   Procedure: ENDARTERECTOMY FEMORAL;  Surgeon: Annice Needy, MD;  Location: [facility] ORS;  Service: Vascular;  Laterality: Left;   ENDOVASCULAR STENT INSERTION Bilateral     Legs   FLEXIBLE BRONCHOSCOPY N/A 02/02/2015   Procedure: FLEXIBLE BRONCHOSCOPY;  Surgeon: Yevonne Pax, MD;  Location: [facility] ORS;  Service: Pulmonary;  Laterality: N/A;   HEMORRHOID SURGERY     PERIPHERAL VASCULAR CATHETERIZATION Left 04/25/2015   Procedure: Lower Extremity Angiography;  Surgeon: Annice Needy, MD;  Location: [facility] INVASIVE CV LAB;  Service: Cardiovascular;  Laterality: Left;   PERIPHERAL VASCULAR CATHETERIZATION Left 04/25/2015   Procedure: Lower Extremity Intervention;  Surgeon: Annice Needy, MD;  Location: [facility] INVASIVE CV LAB;  Service: Cardiovascular;  Laterality: Left;   PERIPHERAL VASCULAR CATHETERIZATION Left 01/02/2016   Procedure: Lower Extremity Angiography;  Surgeon: Annice Needy, MD;  Location: [facility] INVASIVE CV LAB;  Service: Cardiovascular;  Laterality: Left;   PERIPHERAL VASCULAR CATHETERIZATION  01/02/2016   Procedure: Lower Extremity Intervention;  Surgeon: Annice Needy, MD;  Location: [facility] INVASIVE CV LAB;  Service: Cardiovascular;;   THROMBECTOMY FEMORAL ARTERY Left 04/26/2015   Procedure: THROMBECTOMY FEMORAL ARTERY;  Surgeon: Annice Needy, MD;  Location: [facility] ORS;  Service: Vascular;  Laterality: Left;    Medical History: Past Medical History:  Diagnosis Date   Allergy    Environmental   Arthritis    Asthma    Carotid artery stenosis    Carotid stenosis    COPD (chronic obstructive pulmonary disease) (HCC)    Coronary artery disease    Coronary artery disease    Hemorrhoids    Hyperlipidemia    Hypertension    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(HCC)    negative cardiac cath   Nicotine addiction    Peripheral vascular disease (HCC)    Pneumonia    Shortness of breath dyspnea    Tobacco abuse     Family History: Family History  Problem Relation Age of Onset   Hypertension Mother    Hypertension Father     Social History   Socioeconomic History   Marital status: Single    Spouse name: Not on file   Number of children: Not on file   Years of education: Not on  file   Highest education level: Not on file  Occupational History   Not on file  Tobacco Use   Smoking status: Every Day    Current packs/day: 1.00    Average packs/day: 1 pack/day for 41.0 years (41.0 ttl pk-yrs)    Types: Cigarettes   Smokeless tobacco: Never   Tobacco comments:    Less than a 1/2 pack daily  Vaping Use   Vaping status: Never Used  Substance and Sexual Activity   Alcohol use: Yes    Alcohol/week: 0.0 standard drinks of alcohol    Comment: weekends, couple beers, pint of liquer only on weekends.  Last drank approx 1 month ago.   Drug use: No   Sexual activity: Not Currently  Other Topics Concern   Not on file  Social History Narrative   Lives with family    Social Determinants of Health   Financial  Resource Strain: Low Risk  (03/21/2023)   Received from [facility]   Overall Financial Resource Strain (CARDIA)    Difficulty of Paying Living Expenses: Not hard at all  Food Insecurity: No Food Insecurity (03/21/2023)   Received from [facility]   Hunger Vital Sign    Worried About Running Out of Food in the Last Year: Never true    Ran Out of Food in the Last Year: Never true  Transportation Needs: No Transportation Needs (03/21/2023)   Received from [facility] - Transportation    Lack of Transportation (Medical): No    Lack of Transportation (Non-Medical): No  Physical Activity: Inactive (04/04/2018)   Exercise Vital Sign    Days of Exercise per Week: 0 days    Minutes of Exercise per Session: 0 min  Stress: No Stress Concern Present (04/04/2018)   [employer] of Occupational Health - Occupational Stress Questionnaire    Feeling of Stress : Only a little  Social Connections: Moderately Isolated (04/04/2018)   Social Connection and Isolation Panel [NHANES]    Frequency of Communication with Friends and Family: Once a week    Frequency of Social Gatherings with Friends and Family: Once a week    Attends Religious Services: 1 to 4  times per year    Active Member of [employer] or Organizations: No    Attends Banker Meetings: Never    Marital Status: Never married  Intimate Partner Violence: Not At Risk (04/04/2018)   Humiliation, Afraid, Rape, and Kick questionnaire    Fear of Current or Ex-Partner: No    Emotionally Abused: No    Physically Abused: No    Sexually Abused: No      Review of Systems  Constitutional:  Negative for chills, fatigue and unexpected weight change.  HENT:  Negative for congestion, rhinorrhea, sneezing and sore throat.   Eyes:  Negative for redness.  Respiratory: Negative.  Negative for cough, chest tightness, shortness of breath and wheezing.   Cardiovascular: Negative.  Negative for chest pain and palpitations.  Gastrointestinal:  Negative for abdominal pain, constipation, diarrhea, nausea and vomiting.  Genitourinary:  Negative for dysuria and frequency.  Musculoskeletal:  Negative for arthralgias, back pain, joint swelling and neck pain.  Skin:  Negative for rash.  Neurological: Negative.  Negative for tremors and numbness.  Hematological:  Negative for adenopathy. Does not bruise/bleed easily.  Psychiatric/Behavioral:  Negative for behavioral problems (Depression), sleep disturbance and suicidal ideas. The patient is not nervous/anxious.     Vital Signs: BP 105/80 Comment: 140/86  Pulse 69   Temp (!) 97 F (36.1 C)   Resp 16   Ht 5\' 4"  (1.626 m)   Wt 109 lb 9.6 oz (49.7 kg)   LMP  (LMP Unknown)   SpO2 93%   BMI 18.81 kg/m    Physical Exam Vitals reviewed.  Constitutional:      Appearance: Normal appearance. She is normal weight.  HENT:     Head: Normocephalic and atraumatic.  Eyes:     Pupils: Pupils are equal, round, and reactive to light.  Cardiovascular:     Rate and Rhythm: Normal rate and regular rhythm.  Pulmonary:     Effort: Pulmonary effort is normal. No respiratory distress.  Neurological:     Mental Status: She is alert and oriented to  person, place, and time.  Psychiatric:        Mood and Affect: Mood normal.  Behavior: Behavior normal.        Assessment/Plan: 1. Poor appetite Try mirtazapine 15 mg daily at bedtime to increase appetite and improve sleep.  - mirtazapine (REMERON) 15 MG tablet; Take 1 tablet (15 mg total) by mouth at bedtime.  Dispense: 30 tablet; Refill: 3  2. Sleep disturbance Try mirtazapine 15 mg daily at bedtime to increase appetite and improve sleep.  - mirtazapine (REMERON) 15 MG tablet; Take 1 tablet (15 mg total) by mouth at bedtime.  Dispense: 30 tablet; Refill: 3  3. Coronary artery disease due to lipid rich plaque Continue atorvastatin and ezetimibe as prescribed.  - atorvastatin (LIPITOR) 80 MG tablet; Take 1 tablet (80 mg total) by mouth daily.  Dispense: 90 tablet; Refill: 3 - ezetimibe (ZETIA) 10 MG tablet; Take 1 tablet (10 mg total) by mouth daily.  Dispense: 90 tablet; Refill: 3  4. Encounter for medication review Medication list reviewed, updated, refills ordered. - Varenicline Tartrate, Starter, 0.5 MG X 11 & 1 MG X 42 TBPK; Take one 0.5mg  tab once daily for 3 days,then increase to one 0.5mg  tab twice daily for 4 days,then increase to one 1mg  tab twice daily. - gabapentin (NEURONTIN) 100 MG capsule; Take by mouth. - losartan (COZAAR) 50 MG tablet; Take 2 tablets (100 mg total) by mouth daily.  Dispense: 180 tablet; Refill: 3 - oxyCODONE (OXY IR/ROXICODONE) 5 MG immediate release tablet; Take 0.5 tablets (2.5 mg total) by mouth every 6 (six) hours as needed for moderate pain or severe pain.  Dispense: 30 tablet; Refill: 0   General Counseling: Dellia verbalizes understanding of the findings of todays visit and agrees with plan of treatment. I have discussed any further diagnostic evaluation that may be needed or ordered today. We also reviewed her medications today. she has been encouraged to call the office with any questions or concerns that should arise related to todays  visit.    No orders of the defined types were placed in this encounter.   Meds ordered this encounter  Medications   atorvastatin (LIPITOR) 80 MG tablet    Sig: Take 1 tablet (80 mg total) by mouth daily.    Dispense:  90 tablet    Refill:  3   ezetimibe (ZETIA) 10 MG tablet    Sig: Take 1 tablet (10 mg total) by mouth daily.    Dispense:  90 tablet    Refill:  3   losartan (COZAAR) 50 MG tablet    Sig: Take 2 tablets (100 mg total) by mouth daily.    Dispense:  180 tablet    Refill:  3   oxyCODONE (OXY IR/ROXICODONE) 5 MG immediate release tablet    Sig: Take 0.5 tablets (2.5 mg total) by mouth every 6 (six) hours as needed for moderate pain or severe pain.    Dispense:  30 tablet    Refill:  0    Refill now please   mirtazapine (REMERON) 15 MG tablet    Sig: Take 1 tablet (15 mg total) by mouth at bedtime.    Dispense:  30 tablet    Refill:  3    Fill new script today    Return in about 4 weeks (around 05/08/2023) for F/U, eval new med, Josee Speece PCP appetite and sleep.   Total time spent:30 Minutes Time spent includes review of chart, medications, test results, and follow up plan with the patient.   [place] Controlled Substance Database was reviewed by me.  This patient was seen by Arlyss Repress  Ciaira Natividad, FNP-C in collaboration with Dr. Beverely Risen as a part of collaborative care agreement.   Jaquane Boughner R. Tedd Sias, MSN, FNP-C Internal medicine

## 2023-04-11 ENCOUNTER — Telehealth: Payer: Self-pay | Admitting: Nurse Practitioner

## 2023-04-11 NOTE — Telephone Encounter (Signed)
Home Health form completed. Faxed to [facility]; [phone]. Scanned-Toni

## 2023-04-16 ENCOUNTER — Ambulatory Visit (INDEPENDENT_AMBULATORY_CARE_PROVIDER_SITE_OTHER): Payer: 59 | Admitting: Cardiovascular Disease

## 2023-04-16 ENCOUNTER — Encounter: Payer: Self-pay | Admitting: Cardiovascular Disease

## 2023-04-16 VITALS — BP 110/70 | HR 61 | Ht 64.0 in | Wt 108.8 lb

## 2023-04-16 DIAGNOSIS — I70213 Atherosclerosis of native arteries of extremities with intermittent claudication, bilateral legs: Secondary | ICD-10-CM

## 2023-04-16 DIAGNOSIS — I998 Other disorder of circulatory system: Secondary | ICD-10-CM

## 2023-04-16 DIAGNOSIS — F17219 Nicotine dependence, cigarettes, with unspecified nicotine-induced disorders: Secondary | ICD-10-CM | POA: Diagnosis not present

## 2023-04-16 DIAGNOSIS — I10 Essential (primary) hypertension: Secondary | ICD-10-CM | POA: Diagnosis not present

## 2023-04-16 NOTE — Progress Notes (Signed)
Cardiology Office Note   Date:  04/16/2023   ID:  [name], DOB 04/05/1956, MRN [id number]  PCP:  Sallyanne Kuster, NP  Cardiologist:  Adrian Blackwater, MD      History of Present Illness: [name] is a 67 y.o. female who presents for  Chief Complaint  Patient presents with   Follow-up    F/U    Doing well, had lung cancer removed 7/17, and sore from that      Past Medical History:  Diagnosis Date   Allergy    Environmental   Arthritis    Asthma    Carotid artery stenosis    Carotid stenosis    COPD (chronic obstructive pulmonary disease) (HCC)    Coronary artery disease    Coronary artery disease    Hemorrhoids    Hyperlipidemia    Hypertension    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(HCC)    negative cardiac cath   Nicotine addiction    Peripheral vascular disease (HCC)    Pneumonia    Shortness of breath dyspnea    Tobacco abuse      Past Surgical History:  Procedure Laterality Date   ABDOMINAL HYSTERECTOMY     APPENDECTOMY     CARDIAC CATHETERIZATION     CARDIAC SURGERY     CHOLECYSTECTOMY     COLONOSCOPY  12/15/11   OH->bleeding internal hemorrhoids, otherwise normal   COLONOSCOPY WITH PROPOFOL N/A 04/12/2015   Procedure: COLONOSCOPY WITH PROPOFOL;  Surgeon: Midge Minium, MD;  Location: [facility] ENDOSCOPY;  Service: Endoscopy;  Laterality: N/A;   COLONOSCOPY WITH PROPOFOL N/A 01/05/2020   Procedure: COLONOSCOPY WITH PROPOFOL;  Surgeon: Midge Minium, MD;  Location: [facility] ENDOSCOPY;  Service: Endoscopy;  Laterality: N/A;   ENDARTERECTOMY FEMORAL Left 04/26/2015   Procedure: ENDARTERECTOMY FEMORAL;  Surgeon: Annice Needy, MD;  Location: [facility] ORS;  Service: Vascular;  Laterality: Left;   ENDOVASCULAR STENT INSERTION Bilateral    Legs   FLEXIBLE BRONCHOSCOPY N/A 02/02/2015   Procedure: FLEXIBLE BRONCHOSCOPY;  Surgeon: Yevonne Pax, MD;  Location: [facility] ORS;  Service: Pulmonary;  Laterality: N/A;   HEMORRHOID SURGERY     PERIPHERAL VASCULAR CATHETERIZATION Left 04/25/2015    Procedure: Lower Extremity Angiography;  Surgeon: Annice Needy, MD;  Location: [facility] INVASIVE CV LAB;  Service: Cardiovascular;  Laterality: Left;   PERIPHERAL VASCULAR CATHETERIZATION Left 04/25/2015   Procedure: Lower Extremity Intervention;  Surgeon: Annice Needy, MD;  Location: [facility] INVASIVE CV LAB;  Service: Cardiovascular;  Laterality: Left;   PERIPHERAL VASCULAR CATHETERIZATION Left 01/02/2016   Procedure: Lower Extremity Angiography;  Surgeon: Annice Needy, MD;  Location: [facility] INVASIVE CV LAB;  Service: Cardiovascular;  Laterality: Left;   PERIPHERAL VASCULAR CATHETERIZATION  01/02/2016   Procedure: Lower Extremity Intervention;  Surgeon: Annice Needy, MD;  Location: [facility] INVASIVE CV LAB;  Service: Cardiovascular;;   THROMBECTOMY FEMORAL ARTERY Left 04/26/2015   Procedure: THROMBECTOMY FEMORAL ARTERY;  Surgeon: Annice Needy, MD;  Location: [facility] ORS;  Service: Vascular;  Laterality: Left;     Current Outpatient Medications  Medication Sig Dispense Refill   albuterol (VENTOLIN HFA) 108 (90 Base) MCG/ACT inhaler INHALE 2 PUFFS BY MOUTH EVERY 6 HOURS AS NEEDED FOR WHEEZING FOR SHORTNESS OF BREATH 9 g 0   aspirin EC 81 MG EC tablet Take 1 tablet (81 mg total) by mouth daily. 90 tablet 1   atorvastatin (LIPITOR) 80 MG tablet Take 1 tablet (80 mg total) by mouth daily. 90 tablet 3   budesonide-formoterol (SYMBICORT)  80-4.5 MCG/ACT inhaler INHALE 2 PUFFS BY MOUTH ONCE DAILY 22 g 2   butalbital-acetaminophen-caffeine (FIORICET) 50-325-40 MG tablet Take 1 tablet by mouth every 6 (six) hours as needed for headache. 14 tablet 4   cholecalciferol (VITAMIN D) 400 units TABS tablet Take 400 Units by mouth.     ezetimibe (ZETIA) 10 MG tablet Take 1 tablet (10 mg total) by mouth daily. 90 tablet 3   ferrous sulfate 325 (65 FE) MG tablet Take 325 mg by mouth daily.     gabapentin (NEURONTIN) 100 MG capsule Take by mouth.     losartan (COZAAR) 50 MG tablet Take 2 tablets (100 mg total) by mouth daily. 180 tablet  3   metoprolol succinate (TOPROL-XL) 50 MG 24 hr tablet TAKE 1 TABLET BY MOUTH ONCE DAILY WITH  OR  IMMEDIATELY  FOLLOWING  A  MEAL 90 tablet 0   mirtazapine (REMERON) 15 MG tablet Take 1 tablet (15 mg total) by mouth at bedtime. 30 tablet 3   oxyCODONE (OXY IR/ROXICODONE) 5 MG immediate release tablet Take 0.5 tablets (2.5 mg total) by mouth every 6 (six) hours as needed for moderate pain or severe pain. 30 tablet 0   Varenicline Tartrate, Starter, 0.5 MG X 11 & 1 MG X 42 TBPK Take one 0.5mg  tab once daily for 3 days,then increase to one 0.5mg  tab twice daily for 4 days,then increase to one 1mg  tab twice daily.     No current facility-administered medications for this visit.    Allergies:   Aspirin, Tramadol, and Zyrtec [cetirizine]    Social History:   reports that she has been smoking cigarettes. She has a 41 pack-year smoking history. She has never used smokeless tobacco. She reports current alcohol use. She reports that she does not use drugs.   Family History:  family history includes Hypertension in her father and mother.    ROS:     Review of Systems  Constitutional: Negative.   HENT: Negative.    Eyes: Negative.   Respiratory: Negative.    Gastrointestinal: Negative.   Genitourinary: Negative.   Musculoskeletal: Negative.   Skin: Negative.   Neurological: Negative.   Endo/Heme/Allergies: Negative.   Psychiatric/Behavioral: Negative.    All other systems reviewed and are negative.     All other systems are reviewed and negative.    PHYSICAL EXAM: VS:  BP 110/70   Pulse 61   Ht 5\' 4"  (1.626 m)   Wt 108 lb 12.8 oz (49.4 kg)   LMP  (LMP Unknown)   SpO2 95%   BMI 18.68 kg/m  , BMI Body mass index is 18.68 kg/m. Last weight:  Wt Readings from Last 3 Encounters:  04/16/23 108 lb 12.8 oz (49.4 kg)  04/10/23 109 lb 9.6 oz (49.7 kg)  03/05/23 107 lb 12.8 oz (48.9 kg)     Physical Exam Constitutional:      Appearance: Normal appearance.  Cardiovascular:      Rate and Rhythm: Normal rate and regular rhythm.     Heart sounds: Normal heart sounds.  Pulmonary:     Effort: Pulmonary effort is normal.     Breath sounds: Normal breath sounds.  Musculoskeletal:     Right lower leg: No edema.     Left lower leg: No edema.  Neurological:     Mental Status: She is alert.       EKG:   Recent Labs: 05/28/2022: ALT 11; BUN 13; Creatinine, Ser 0.98; Hemoglobin 12.7; Platelets 151; Potassium 4.2; Sodium  140; TSH 1.010    Lipid Panel    Component Value Date/Time   CHOL 149 05/28/2022 1103   CHOL 147 10/16/2013 0439   TRIG 76 05/28/2022 1103   TRIG 122 10/16/2013 0439   HDL 79 05/28/2022 1103   HDL 31 (L) 10/16/2013 0439   CHOLHDL 1.9 05/28/2022 1103   CHOLHDL 1.4 08/06/2021 0539   VLDL 9 08/06/2021 0539   VLDL 24 10/16/2013 0439   LDLCALC 55 05/28/2022 1103   LDLCALC 92 10/16/2013 0439      Other studies Reviewed: Additional studies/ records that were reviewed today include:  Review of the above records demonstrates:       No data to display            ASSESSMENT AND PLAN:    ICD-10-CM   1. Atherosclerosis of native artery of both lower extremities with intermittent claudication (HCC)  I70.213    doing well, restart asp and isosorbide    2. Essential hypertension  I10     3. Ischemic leg  I99.8     4. Cigarette nicotine dependence with nicotine-induced disorder  F17.219        Problem List Items Addressed This Visit       Cardiovascular and Mediastinum   Essential hypertension   Ischemic leg   Atherosclerosis of native arteries of extremity with intermittent claudication (HCC) - Primary     Nervous and Auditory   Cigarette nicotine dependence with nicotine-induced disorder       Disposition:   Return in about 3 months (around 07/17/2023).    Total time spent: 30 minutes  Signed,  Adrian Blackwater, MD  04/16/2023 9:18 AM    [facility]

## 2023-04-21 ENCOUNTER — Encounter: Payer: Self-pay | Admitting: Nurse Practitioner

## 2023-04-21 DIAGNOSIS — R630 Anorexia: Secondary | ICD-10-CM | POA: Insufficient documentation

## 2023-04-27 DIAGNOSIS — R911 Solitary pulmonary nodule: Secondary | ICD-10-CM | POA: Diagnosis not present

## 2023-05-05 ENCOUNTER — Emergency Department
Admission: EM | Admit: 2023-05-05 | Discharge: 2023-05-05 | Disposition: A | Discharge status: Home / Self Care | Payer: 59 | Attending: Emergency Medicine | Admitting: Emergency Medicine

## 2023-05-05 ENCOUNTER — Emergency Department: Payer: 59

## 2023-05-05 ENCOUNTER — Other Ambulatory Visit: Payer: Self-pay

## 2023-05-05 DIAGNOSIS — R109 Unspecified abdominal pain: Secondary | ICD-10-CM

## 2023-05-05 DIAGNOSIS — I10 Essential (primary) hypertension: Secondary | ICD-10-CM | POA: Diagnosis not present

## 2023-05-05 DIAGNOSIS — J441 Chronic obstructive pulmonary disease with (acute) exacerbation: Secondary | ICD-10-CM | POA: Insufficient documentation

## 2023-05-05 DIAGNOSIS — I2510 Atherosclerotic heart disease of native coronary artery without angina pectoris: Secondary | ICD-10-CM | POA: Insufficient documentation

## 2023-05-05 DIAGNOSIS — N390 Urinary tract infection, site not specified: Secondary | ICD-10-CM | POA: Diagnosis not present

## 2023-05-05 DIAGNOSIS — J45909 Unspecified asthma, uncomplicated: Secondary | ICD-10-CM | POA: Diagnosis not present

## 2023-05-05 DIAGNOSIS — R6889 Other general symptoms and signs: Secondary | ICD-10-CM | POA: Diagnosis not present

## 2023-05-05 LAB — URINALYSIS, ROUTINE W REFLEX MICROSCOPIC
Bilirubin Urine: NEGATIVE
Glucose, UA: NEGATIVE mg/dL
Hgb urine dipstick: NEGATIVE
Ketones, ur: NEGATIVE mg/dL
Nitrite: NEGATIVE
Protein, ur: NEGATIVE mg/dL
Specific Gravity, Urine: 1.02 (ref 1.005–1.030)
pH: 5.5 (ref 5.0–8.0)

## 2023-05-05 LAB — CBC WITH DIFFERENTIAL/PLATELET
Abs Immature Granulocytes: 0.01 10*3/uL (ref 0.00–0.07)
Basophils Absolute: 0 10*3/uL (ref 0.0–0.1)
Basophils Relative: 0 %
Eosinophils Absolute: 0.1 10*3/uL (ref 0.0–0.5)
Eosinophils Relative: 1 %
HCT: 39.7 % (ref 36.0–46.0)
Hemoglobin: 12.5 g/dL (ref 12.0–15.0)
Immature Granulocytes: 0 %
Lymphocytes Relative: 31 %
Lymphs Abs: 1.7 10*3/uL (ref 0.7–4.0)
MCH: 30.6 pg (ref 26.0–34.0)
MCHC: 31.5 g/dL (ref 30.0–36.0)
MCV: 97.1 fL (ref 80.0–100.0)
Monocytes Absolute: 0.3 10*3/uL (ref 0.1–1.0)
Monocytes Relative: 6 %
Neutro Abs: 3.4 10*3/uL (ref 1.7–7.7)
Neutrophils Relative %: 62 %
Platelets: 218 10*3/uL (ref 150–400)
RBC: 4.09 MIL/uL (ref 3.87–5.11)
RDW: 14.3 % (ref 11.5–15.5)
WBC: 5.5 10*3/uL (ref 4.0–10.5)
nRBC: 0 % (ref 0.0–0.2)

## 2023-05-05 LAB — COMPREHENSIVE METABOLIC PANEL
ALT: 21 U/L (ref 0–44)
AST: 25 U/L (ref 15–41)
Albumin: 4.4 g/dL (ref 3.5–5.0)
Alkaline Phosphatase: 71 U/L (ref 38–126)
Anion gap: 12 (ref 5–15)
BUN: 22 mg/dL (ref 8–23)
CO2: 21 mmol/L — ABNORMAL LOW (ref 22–32)
Calcium: 10 mg/dL (ref 8.9–10.3)
Chloride: 107 mmol/L (ref 98–111)
Creatinine, Ser: 0.92 mg/dL (ref 0.44–1.00)
GFR, Estimated: >60 mL/min (ref >60)
Glucose, Bld: 92 mg/dL (ref 70–99)
Potassium: 4.1 mmol/L (ref 3.5–5.1)
Sodium: 140 mmol/L (ref 135–145)
Total Bilirubin: 0.6 mg/dL (ref 0.3–1.2)
Total Protein: 8.5 g/dL — ABNORMAL HIGH (ref 6.5–8.1)

## 2023-05-05 LAB — URINALYSIS, MICROSCOPIC (REFLEX)

## 2023-05-05 LAB — LIPASE, BLOOD: Lipase: 36 U/L (ref 11–51)

## 2023-05-05 MED ORDER — CEFDINIR 300 MG PO CAPS: 300.0000 mg | ORAL_CAPSULE | Freq: Two times a day (BID) | ORAL | 0 refills | Status: AC

## 2023-05-05 MED ORDER — MORPHINE SULFATE (PF) 4 MG/ML IV SOLN
4.0000 mg | Freq: Once | INTRAVENOUS | Status: AC
  Administered 2023-05-05: 4 mg via INTRAVENOUS
  Filled 2023-05-05: qty 1

## 2023-05-05 MED ORDER — SODIUM CHLORIDE 0.9 % IV SOLN
1.0000 g | Freq: Once | INTRAVENOUS | Status: AC
  Administered 2023-05-05: 1 g via INTRAVENOUS
  Filled 2023-05-05: qty 10

## 2023-05-05 MED ORDER — IOHEXOL 300 MG/ML  SOLN
100.0000 mL | Freq: Once | INTRAMUSCULAR | Status: AC | PRN
  Administered 2023-05-05: 100 mL via INTRAVENOUS

## 2023-05-05 MED ORDER — OXYCODONE-ACETAMINOPHEN 5-325 MG PO TABS
1.0000 | ORAL_TABLET | Freq: Four times a day (QID) | ORAL | 0 refills | Status: DC | PRN
Start: 2023-05-05 — End: 2023-06-20

## 2023-05-05 MED ORDER — OXYCODONE-ACETAMINOPHEN 5-325 MG PO TABS
1.0000 | ORAL_TABLET | Freq: Once | ORAL | Status: AC
  Administered 2023-05-05: 1 via ORAL
  Filled 2023-05-05: qty 1

## 2023-05-05 NOTE — ED Notes (Signed)
Pt gone to CT 

## 2023-05-05 NOTE — ED Provider Notes (Addendum)
[facility] Provider Note    Event Date/Time   First MD Initiated Contact with Patient 05/05/23 1739     (approximate)   History   Abdominal Pain   HPI  [name] is a 67 y.o. female with a history of COPD, CAD, hypertension, hyperlipidemia, peripheral vascular disease, asthma, arthritis who presents with right-sided abdominal pain mainly in the mid and upper abdomen which has been present for about a month, but has acutely worsened in the last several days.  The patient states he feels a bump or knot in this area.  She feels better when she is standing up and worse when she is sitting.  She denies any associated nausea or vomiting.  She has no diarrhea or change in her bowel movements.  She has any fever or chills.  She has no prior history of this pain.  Reviewed the past medical records.  The patient's most recent outpatient encounter was with Dr. Welton Flakes from cardiology on 8/13 for follow-up of her chronic conditions.  The patient had colonoscopy as well as upper endoscopy with bile disease performed by GI at [facility] on 8/5.  She had thoracoscopy with segmentectomy by surgery at [facility] on 7/17.   Physical Exam   Triage Vital Signs: ED Triage Vitals  Encounter Vitals Group     BP 05/05/23 1734 (!) 159/84     Systolic BP Percentile --      Diastolic BP Percentile --      Pulse Rate 05/05/23 1734 76     Resp 05/05/23 1734 20     Temp 05/05/23 1734 98.1 F (36.7 C)     Temp src --      SpO2 05/05/23 1734 96 %     Weight 05/05/23 1731 98 lb 11.2 oz (44.8 kg)     Height 05/05/23 1731 5\' 4"  (1.626 m)     Head Circumference --      Peak Flow --      Pain Score 05/05/23 1731 8     Pain Loc --      Pain Education --      Exclude from Growth Chart --     Most recent vital signs: Vitals:   05/05/23 2030 05/05/23 2200  BP: (!) 161/82 (!) 165/81  Pulse: 68 69  Resp: 13 16  Temp:    SpO2: 95% 90%     General: Awake, no distress.  CV:  Good peripheral  perfusion.  Resp:  Normal effort.  Abd:  Soft with moderate right mid and upper abdominal tenderness.  No distention.  Other:  No jaundice or scleral icterus.   ED Results / Procedures / Treatments   Labs (all labs ordered are listed, but only abnormal results are displayed) Labs Reviewed  COMPREHENSIVE METABOLIC PANEL - Abnormal; Notable for the following components:      Result Value   CO2 21 (*)    Total Protein 8.5 (*)    All other components within normal limits  URINALYSIS, ROUTINE W REFLEX MICROSCOPIC - Abnormal; Notable for the following components:   Leukocytes,Ua SMALL (*)    All other components within normal limits  URINALYSIS, MICROSCOPIC (REFLEX) - Abnormal; Notable for the following components:   Bacteria, UA FEW (*)    All other components within normal limits  CBC WITH DIFFERENTIAL/PLATELET  LIPASE, BLOOD     EKG     RADIOLOGY  CT abdomen/pelvis: I independently viewed and interpreted the images; there are no dilated bowel  loops or any free air or free fluid.  Radiology report indicates the following:  IMPRESSION: No acute findings in the abdomen or pelvis.  Prior cholecystectomy with stable intrahepatic and extrahepatic biliary ductal dilatation, likely related to post cholecystectomy state. Recommend correlation with LFTs.   PROCEDURES:  Critical Care performed: No  Procedures   MEDICATIONS ORDERED IN ED: Medications  morphine (PF) 4 MG/ML injection 4 mg (4 mg Intravenous Given 05/05/23 1812)  iohexol (OMNIPAQUE) 300 MG/ML solution 100 mL (100 mLs Intravenous Contrast Given 05/05/23 2008)  cefTRIAXone (ROCEPHIN) 1 g in sodium chloride 0.9 % 100 mL IVPB (0 g Intravenous Stopped 05/05/23 2236)  oxyCODONE-acetaminophen (PERCOCET/ROXICET) 5-325 MG per tablet 1 tablet (1 tablet Oral Given 05/05/23 2234)     IMPRESSION / MDM / ASSESSMENT AND PLAN / ED COURSE  I reviewed the triage vital signs and the nursing notes.  67 year old female with PMH as  noted above presents with right sided abdominal pain which has been present subacutely but has worsened in the last several days.  She has no significant associated symptoms.  She is tender on exam.  Vital signs are normal except for hypertension.  She is status post cholecystectomy.  Differential diagnosis includes, but is not limited to, colitis, diverticulitis, SBO, volvulus, pancreatitis, other hepatobiliary etiology, gastritis, PUD, gastroenteritis, UTI/pyelonephritis.  Patient's presentation is most consistent with acute presentation with potential threat to life or bodily function.  ----------------------------------------- 10:19 PM on 05/05/2023 -----------------------------------------  Labs are unremarkable.  There is no leukocytosis.  LFTs and lipase are normal.  CT shows no acute findings.  There is biliary dilatation but this is chronic, and expected after cholecystectomy.  Given the normal LFTs and bilirubin, there is no evidence of acute biliary obstruction.  Urinalysis does show findings concerning for possible UTI.  On further discussion with the patient, she states that the pain is somewhat in the back on the right side as well.  It could be that she is developing some pyelonephritis.  However, the patient has no fever or systemic symptoms.  There is no evidence of sepsis.  She is appropriate for outpatient treatment.  I counseled the patient on the results of the workup.  She is feeling somewhat better and feels comfortable to go home.  She is stable for discharge at this time.  I have prescribed a course of cefdinir as well as a small quantity of analgesia.  I gave strict return precautions and she expresses understanding.   FINAL CLINICAL IMPRESSION(S) / ED DIAGNOSES   Final diagnoses:  Urinary tract infection without hematuria, site unspecified  Abdominal pain, unspecified abdominal location     Rx / DC Orders   ED Discharge Orders          Ordered    cefdinir  (OMNICEF) 300 MG capsule  2 times daily        05/05/23 2217    oxyCODONE-acetaminophen (PERCOCET) 5-325 MG tablet  Every 6 hours PRN        05/05/23 2217             Note:  This document was prepared using Dragon voice recognition software and may include unintentional dictation errors.    Dionne Bucy, MD 05/05/23 6045    Dionne Bucy, MD 05/05/23 2236

## 2023-05-05 NOTE — ED Triage Notes (Signed)
Patient comes in from home via [facility] with complaints of abdominal pain. According to EMS, the patient had surgery about 4 weeks ago to have the right lower lobe of her right lung removed, and that the patient has been feeling pain in her abdomen ever since. The patient and her family members noticed a knot in her abdomen a couple days ago. Pt states that the area is painful when she is sitting, but she gets relief when she stands. Pt has a history of hypertension, and lung cancer. Pt is alert and oriented x4, and with no signs of distress at this time.

## 2023-05-05 NOTE — Discharge Instructions (Addendum)
Your urine is showing findings of an infection, which could be causing the pain if it is started to go up into the kidney.  The CT scan does not show any signs of blockage in your intestines, any mass in your abdomen, or other worrisome findings.  Take the antibiotic as prescribed and finish the full course.  You may take the Percocet as needed over the next few days for pain.  Follow-up with your primary care provider.  Return to the ER immediately for new, worsening, or persistent severe abdominal, flank, or back pain, fever, vomiting, weakness, or any other new or worsening symptoms that concern you.

## 2023-05-05 NOTE — ED Notes (Signed)
Pt care taken, is hurting in the upper abdomen.  Waiting for ct scan.

## 2023-05-08 ENCOUNTER — Encounter: Payer: Self-pay | Admitting: Nurse Practitioner

## 2023-05-08 ENCOUNTER — Ambulatory Visit: Payer: 59 | Admitting: Nurse Practitioner

## 2023-05-08 ENCOUNTER — Telehealth: Payer: Self-pay | Admitting: Nurse Practitioner

## 2023-05-08 VITALS — BP 122/68 | HR 65 | Temp 97.8°F | Resp 16 | Ht 64.0 in | Wt 107.4 lb

## 2023-05-08 DIAGNOSIS — G479 Sleep disorder, unspecified: Secondary | ICD-10-CM | POA: Diagnosis not present

## 2023-05-08 DIAGNOSIS — R630 Anorexia: Secondary | ICD-10-CM | POA: Diagnosis not present

## 2023-05-08 DIAGNOSIS — R1906 Epigastric swelling, mass or lump: Secondary | ICD-10-CM | POA: Diagnosis not present

## 2023-05-08 NOTE — Progress Notes (Signed)
[facility] [address] [place], [place] [address]  Internal MEDICINE  Office Visit Note  Patient Name: [name]  [id number]  [id number]  Date of Service: 05/08/2023  Chief Complaint  Patient presents with   Hypertension   Hyperlipidemia   Follow-up    Eval new med. Also went to ED for uti.     HPI [name] presents for a follow-up visit for poor appetite, abdominal pain and lump and insomnia.  Lump in abdomen tender to touch, noticed around last Friday. Went to ED on Sunday, treated with cefdinir for UTI and told that this will make the lump go away too. Nothing found on CT scan in ED, no other imaging done. Has a history of umbilical hernia in the past. Also having right sided pain around the scars from the thoracotomy incisions.  Poor appetite -- no change, no improvement.  Insomnia -- improved    Current Medication: No facility-administered encounter medications on file as of 05/08/2023.   Outpatient Encounter Medications as of 05/08/2023  Medication Sig Note   albuterol (VENTOLIN HFA) 108 (90 Base) MCG/ACT inhaler INHALE 2 PUFFS BY MOUTH EVERY 6 HOURS AS NEEDED FOR WHEEZING FOR SHORTNESS OF BREATH    aspirin EC 81 MG EC tablet Take 1 tablet (81 mg total) by mouth daily.    atorvastatin (LIPITOR) 80 MG tablet Take 1 tablet (80 mg total) by mouth daily.    budesonide-formoterol (SYMBICORT) 80-4.5 MCG/ACT inhaler INHALE 2 PUFFS BY MOUTH ONCE DAILY    butalbital-acetaminophen-caffeine (FIORICET) 50-325-40 MG tablet Take 1 tablet by mouth every 6 (six) hours as needed for headache. (Patient not taking: Reported on 06/12/2023)    [EXPIRED] cefdinir (OMNICEF) 300 MG capsule Take 1 capsule (300 mg total) by mouth 2 (two) times daily for 10 days.    cholecalciferol (VITAMIN D) 400 units TABS tablet Take 400 Units by mouth.    ezetimibe (ZETIA) 10 MG tablet Take 1 tablet (10 mg total) by mouth daily.    ferrous sulfate 325 (65 FE) MG tablet Take 325 mg by mouth daily.     losartan (COZAAR) 50 MG tablet Take 2 tablets (100 mg total) by mouth daily.    metoprolol succinate (TOPROL-XL) 50 MG 24 hr tablet TAKE 1 TABLET BY MOUTH ONCE DAILY WITH  OR  IMMEDIATELY  FOLLOWING  A  MEAL (Patient taking differently: Take 50 mg by mouth daily. WITH  OR  IMMEDIATELY  FOLLOWING  A  MEAL)    mirtazapine (REMERON) 15 MG tablet Take 1 tablet (15 mg total) by mouth at bedtime.    oxyCODONE (OXY IR/ROXICODONE) 5 MG immediate release tablet Take 0.5 tablets (2.5 mg total) by mouth every 6 (six) hours as needed for moderate pain or severe pain. 06/12/2023: 08/21:  3 days supply dispensed    oxyCODONE-acetaminophen (PERCOCET) 5-325 MG tablet Take 1 tablet by mouth every 6 (six) hours as needed for severe pain. 06/12/2023: 09/02: 3 days supply dispensed   [EXPIRED] Varenicline Tartrate, Starter, 0.5 MG X 11 & 1 MG X 42 TBPK Take one 0.5mg  tab once daily for 3 days,then increase to one 0.5mg  tab twice daily for 4 days,then increase to one 1mg  tab twice daily.     Surgical History: Past Surgical History:  Procedure Laterality Date   ABDOMINAL HYSTERECTOMY     APPENDECTOMY     CARDIAC CATHETERIZATION     CARDIAC SURGERY     CHOLECYSTECTOMY     COLONOSCOPY  12/15/11   OH->bleeding internal hemorrhoids,  otherwise normal   COLONOSCOPY WITH PROPOFOL N/A 04/12/2015   Procedure: COLONOSCOPY WITH PROPOFOL;  Surgeon: Midge Minium, MD;  Location: [facility] ENDOSCOPY;  Service: Endoscopy;  Laterality: N/A;   COLONOSCOPY WITH PROPOFOL N/A 01/05/2020   Procedure: COLONOSCOPY WITH PROPOFOL;  Surgeon: Midge Minium, MD;  Location: [facility] ENDOSCOPY;  Service: Endoscopy;  Laterality: N/A;   ENDARTERECTOMY FEMORAL Left 04/26/2015   Procedure: ENDARTERECTOMY FEMORAL;  Surgeon: Annice Needy, MD;  Location: [facility] ORS;  Service: Vascular;  Laterality: Left;   ENDOVASCULAR STENT INSERTION Bilateral    Legs   FLEXIBLE BRONCHOSCOPY N/A 02/02/2015   Procedure: FLEXIBLE BRONCHOSCOPY;  Surgeon: Yevonne Pax, MD;  Location: [facility]  ORS;  Service: Pulmonary;  Laterality: N/A;   HEMORRHOID SURGERY     PERIPHERAL VASCULAR CATHETERIZATION Left 04/25/2015   Procedure: Lower Extremity Angiography;  Surgeon: Annice Needy, MD;  Location: [facility] INVASIVE CV LAB;  Service: Cardiovascular;  Laterality: Left;   PERIPHERAL VASCULAR CATHETERIZATION Left 04/25/2015   Procedure: Lower Extremity Intervention;  Surgeon: Annice Needy, MD;  Location: [facility] INVASIVE CV LAB;  Service: Cardiovascular;  Laterality: Left;   PERIPHERAL VASCULAR CATHETERIZATION Left 01/02/2016   Procedure: Lower Extremity Angiography;  Surgeon: Annice Needy, MD;  Location: [facility] INVASIVE CV LAB;  Service: Cardiovascular;  Laterality: Left;   PERIPHERAL VASCULAR CATHETERIZATION  01/02/2016   Procedure: Lower Extremity Intervention;  Surgeon: Annice Needy, MD;  Location: [facility] INVASIVE CV LAB;  Service: Cardiovascular;;   THROMBECTOMY FEMORAL ARTERY Left 04/26/2015   Procedure: THROMBECTOMY FEMORAL ARTERY;  Surgeon: Annice Needy, MD;  Location: [facility] ORS;  Service: Vascular;  Laterality: Left;    Medical History: Past Medical History:  Diagnosis Date   Allergy    Environmental   Arthritis    Asthma    Carotid artery stenosis    Carotid stenosis    COPD (chronic obstructive pulmonary disease) (HCC)    Coronary artery disease    Coronary artery disease    Hemorrhoids    Hyperlipidemia    Hypertension    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(HCC)    negative cardiac cath   Nicotine addiction    Peripheral vascular disease (HCC)    Pneumonia    Shortness of breath dyspnea    Tobacco abuse     Family History: Family History  Problem Relation Age of Onset   Hypertension Mother    Hypertension Father     Social History   Socioeconomic History   Marital status: Single    Spouse name: Not on file   Number of children: Not on file   Years of education: Not on file   Highest education level: Not on file  Occupational History   Not on file  Tobacco Use   Smoking status: Every Day     Current packs/day: 1.00    Average packs/day: 1 pack/day for 41.0 years (41.0 ttl pk-yrs)    Types: Cigarettes   Smokeless tobacco: Never   Tobacco comments:    Less than a 1/2 pack daily  Vaping Use   Vaping status: Never Used  Substance and Sexual Activity   Alcohol use: Yes    Alcohol/week: 0.0 standard drinks of alcohol    Comment: weekends, couple beers, pint of liquer only on weekends.  Last drank approx 1 month ago.   Drug use: No   Sexual activity: Not Currently  Other Topics Concern   Not on file  Social History Narrative   Lives with family    Social Determinants of  Health   Financial Resource Strain: Low Risk  (03/21/2023)   Received from [facility]   Overall Financial Resource Strain (CARDIA)    Difficulty of Paying Living Expenses: Not hard at all  Food Insecurity: No Food Insecurity (06/12/2023)   Hunger Vital Sign    Worried About Running Out of Food in the Last Year: Never true    Ran Out of Food in the Last Year: Never true  Transportation Needs: No Transportation Needs (06/12/2023)   PRAPARE - Administrator, Civil Service (Medical): No    Lack of Transportation (Non-Medical): No  Physical Activity: Inactive (04/04/2018)   Exercise Vital Sign    Days of Exercise per Week: 0 days    Minutes of Exercise per Session: 0 min  Stress: No Stress Concern Present (04/04/2018)   [employer] of Occupational Health - Occupational Stress Questionnaire    Feeling of Stress : Only a little  Social Connections: Moderately Isolated (04/04/2018)   Social Connection and Isolation Panel [NHANES]    Frequency of Communication with Friends and Family: Once a week    Frequency of Social Gatherings with Friends and Family: Once a week    Attends Religious Services: 1 to 4 times per year    Active Member of [employer] or Organizations: No    Attends Banker Meetings: Never    Marital Status: Never married  Intimate Partner Violence: Not At Risk  (06/12/2023)   Humiliation, Afraid, Rape, and Kick questionnaire    Fear of Current or Ex-Partner: No    Emotionally Abused: No    Physically Abused: No    Sexually Abused: No      Review of Systems  Constitutional:  Positive for appetite change. Negative for chills, fatigue and unexpected weight change.  HENT:  Negative for congestion, rhinorrhea, sneezing and sore throat.   Eyes:  Negative for redness.  Respiratory: Negative.  Negative for cough, chest tightness, shortness of breath and wheezing.   Cardiovascular: Negative.  Negative for chest pain and palpitations.  Gastrointestinal:  Negative for abdominal pain, constipation, diarrhea, nausea and vomiting.  Genitourinary:  Negative for dysuria and frequency.  Musculoskeletal:  Negative for arthralgias, back pain, joint swelling and neck pain.  Skin:  Negative for rash.  Neurological: Negative.  Negative for tremors and numbness.  Hematological:  Negative for adenopathy. Does not bruise/bleed easily.  Psychiatric/Behavioral:  Positive for sleep disturbance. Negative for behavioral problems (Depression), self-injury and suicidal ideas. The patient is not nervous/anxious.     Vital Signs: BP 122/68   Pulse 65   Temp 97.8 F (36.6 C)   Resp 16   Ht 5\' 4"  (1.626 m)   Wt 107 lb 6.4 oz (48.7 kg)   LMP  (LMP Unknown)   SpO2 94%   BMI 18.44 kg/m    Physical Exam Vitals reviewed.  Constitutional:      Appearance: Normal appearance. She is normal weight.  HENT:     Head: Normocephalic and atraumatic.  Eyes:     Pupils: Pupils are equal, round, and reactive to light.  Cardiovascular:     Rate and Rhythm: Normal rate and regular rhythm.  Pulmonary:     Effort: Pulmonary effort is normal. No respiratory distress.  Neurological:     Mental Status: She is alert and oriented to person, place, and time.  Psychiatric:        Mood and Affect: Mood normal.        Behavior: Behavior normal.  Assessment/Plan: 1.  Epigastric mass Ultrasound ordered for further evaluation - US Abdomen Limited; Future  2. Poor appetite Improved with mirtazapine  3. Sleep disturbance Improved with mirtazapine   General Counseling: Lailah verbalizes understanding of the findings of todays visit and agrees with plan of treatment. I have discussed any further diagnostic evaluation that may be needed or ordered today. We also reviewed her medications today. she has been encouraged to call the office with any questions or concerns that should arise related to todays visit.    Orders Placed This Encounter  Procedures   US Abdomen Limited    No orders of the defined types were placed in this encounter.   Return for F/U, Daouda Lonzo PCP to discuss abdominal US results. .   Total time spent:30 Minutes Time spent includes review of chart, medications, test results, and follow up plan with the patient.   [place] Controlled Substance Database was reviewed by me.  This patient was seen by Sallyanne Kuster, FNP-C in collaboration with Dr. Beverely Risen as a part of collaborative care agreement.   Odelle Kosier R. Tedd Sias, MSN, FNP-C Internal medicine

## 2023-05-08 NOTE — Telephone Encounter (Signed)
Notified patient of U/S appointment date, arrival time, location-[place]

## 2023-05-13 ENCOUNTER — Telehealth: Payer: Self-pay | Admitting: *Deleted

## 2023-05-13 NOTE — Progress Notes (Signed)
  Care Coordination   Note   05/13/2023 Name: [name] MRN: [id number] DOB: 1956-06-25  [name] is a 68 y.o. year old female who sees Sallyanne Kuster, NP for primary care. I reached out to [employer] by phone today to offer care coordination services.  [name] was given information about Care Coordination services today including:   The Care Coordination services include support from the care team which includes your Nurse Coordinator, Clinical Social Worker, or Pharmacist.  The Care Coordination team is here to help remove barriers to the health concerns and goals most important to you. Care Coordination services are voluntary, and the patient may decline or stop services at any time by request to their care team member.   Care Coordination Consent Status: Patient agreed to services and verbal consent obtained.   Follow up plan:  Telephone appointment with care coordination team member scheduled for:  05/16/2023  Encounter Outcome:  Patient Scheduled   Burman Nieves, [facility] Care Coordination Care Guide Direct Dial: [phone]

## 2023-05-15 ENCOUNTER — Ambulatory Visit
Admission: RE | Admit: 2023-05-15 | Discharge: 2023-05-15 | Disposition: A | Discharge status: Home / Self Care | Payer: 59 | Source: Ambulatory Visit | Attending: Nurse Practitioner | Admitting: Nurse Practitioner

## 2023-05-15 DIAGNOSIS — R1900 Intra-abdominal and pelvic swelling, mass and lump, unspecified site: Secondary | ICD-10-CM | POA: Diagnosis not present

## 2023-05-15 DIAGNOSIS — R1906 Epigastric swelling, mass or lump: Secondary | ICD-10-CM | POA: Insufficient documentation

## 2023-05-16 ENCOUNTER — Ambulatory Visit: Payer: Self-pay | Admitting: *Deleted

## 2023-05-16 NOTE — Patient Outreach (Addendum)
  Care Coordination   Initial Visit Note   05/16/2023 Name: [name] MRN: [id number] DOB: June 05, 1956  [name] is a 67 y.o. year old female who sees Sallyanne Kuster, NP for primary care. I spoke with  [name] by phone today.  What matters to the patients health and wellness today?  Patient denies having any mental health or care coordination needs at this time. Patient encouraged to call this social worker with any additional community resource needs.   Goals Addressed             This Visit's Progress    care coordination activities       Interventions Today    Flowsheet Row Most Recent Value  Chronic Disease   Chronic disease during today's visit Other  [EMMI call-sadness]  General Interventions   General Interventions Discussed/Reviewed General Interventions Discussed, Doctor Visits  [facility] management program discussed, needs assessment completed, SDOH screen completed]  Doctor Visits Discussed/Reviewed Doctor Visits Reviewed, PCP  [PCP follow up 05/31/23]  Mental Health Interventions   Mental Health Discussed/Reviewed Mental Health Discussed, Coping Strategies, Depression  [patient assessed for mental health needs, ongoing mental health counseling encouraged, emotional support provided]  Safety Interventions   Safety Discussed/Reviewed Safety Discussed              SDOH assessments and interventions completed:  Yes  SDOH Interventions Today    Flowsheet Row Most Recent Value  SDOH Interventions   Food Insecurity Interventions Intervention Not Indicated  Housing Interventions Intervention Not Indicated  Transportation Interventions Intervention Not Indicated  Utilities Interventions Intervention Not Indicated        Care Coordination Interventions:  Yes, provided   Follow up plan: No further intervention required.   Encounter Outcome:  Patient Visit Completed

## 2023-05-16 NOTE — Patient Instructions (Signed)
Visit Information  Thank you for taking time to visit with me today. Please don't hesitate to contact me if I can be of assistance to you.    If you are experiencing a Mental Health or Behavioral Health Crisis or need someone to talk to, please call the Suicide and Crisis Lifeline: 988    No further follow up required: patient to contact this Child psychotherapist with any additional care coordination needs  [employer], LCSW [place]  [facility], [facility] Licensed Clinical Social Geologist, engineering Dial: [phone]

## 2023-05-28 ENCOUNTER — Telehealth: Payer: Self-pay | Admitting: Nurse Practitioner

## 2023-05-28 DIAGNOSIS — R911 Solitary pulmonary nodule: Secondary | ICD-10-CM | POA: Diagnosis not present

## 2023-05-28 NOTE — Telephone Encounter (Signed)
Received [facility] home health plan of care. Gave to AA for signature-nm

## 2023-05-31 ENCOUNTER — Encounter: Payer: Self-pay | Admitting: Nurse Practitioner

## 2023-05-31 ENCOUNTER — Telehealth (INDEPENDENT_AMBULATORY_CARE_PROVIDER_SITE_OTHER): Payer: 59 | Admitting: Nurse Practitioner

## 2023-05-31 VITALS — Ht 64.0 in | Wt 107.0 lb

## 2023-05-31 DIAGNOSIS — K439 Ventral hernia without obstruction or gangrene: Secondary | ICD-10-CM

## 2023-05-31 NOTE — Progress Notes (Signed)
[facility] [address] [place], [place] [address]  Internal MEDICINE  Telephone Visit  Patient Name: [name]  [id number]  [id number]  Date of Service: 05/31/2023  I connected with the patient at 1030 by telephone and verified the patients identity using two identifiers.   I discussed the limitations, risks, security and privacy concerns of performing an evaluation and management service by telephone and the availability of in person appointments. I also discussed with the patient that there may be a patient responsible charge related to the service.  The patient expressed understanding and agrees to proceed.    Chief Complaint  Patient presents with   Telephone Assessment    [id number]   Telephone Screen    U/s result     HPI Nazalia presents for a telehealth virtual visit for ultrasound results.   Abdominal ultrasound IMPRESSION: 1. Irregular, heterogeneous mass in the subcutaneous soft tissues of the midline upper abdomen. On review of the prior CT scan, there is a subtle fascial defect in the midline upper abdomen, above a small fat containing umbilical hernia. This would correspond in location to the current palpable mass. Although the etiology of this lump is not definitive, it is suspected to be herniated peritoneal fat from a small midline hernia. This could be further assessed, if desired clinically, with MRI.  Current Medication: Outpatient Encounter Medications as of 05/31/2023  Medication Sig   albuterol (VENTOLIN HFA) 108 (90 Base) MCG/ACT inhaler INHALE 2 PUFFS BY MOUTH EVERY 6 HOURS AS NEEDED FOR WHEEZING FOR SHORTNESS OF BREATH   aspirin EC 81 MG EC tablet Take 1 tablet (81 mg total) by mouth daily.   atorvastatin (LIPITOR) 80 MG tablet Take 1 tablet (80 mg total) by mouth daily.   budesonide-formoterol (SYMBICORT) 80-4.5 MCG/ACT inhaler INHALE 2 PUFFS BY MOUTH ONCE DAILY   butalbital-acetaminophen-caffeine (FIORICET) 50-325-40 MG tablet  Take 1 tablet by mouth every 6 (six) hours as needed for headache.   cholecalciferol (VITAMIN D) 400 units TABS tablet Take 400 Units by mouth.   ezetimibe (ZETIA) 10 MG tablet Take 1 tablet (10 mg total) by mouth daily.   ferrous sulfate 325 (65 FE) MG tablet Take 325 mg by mouth daily.   losartan (COZAAR) 50 MG tablet Take 2 tablets (100 mg total) by mouth daily.   metoprolol succinate (TOPROL-XL) 50 MG 24 hr tablet TAKE 1 TABLET BY MOUTH ONCE DAILY WITH  OR  IMMEDIATELY  FOLLOWING  A  MEAL   mirtazapine (REMERON) 15 MG tablet Take 1 tablet (15 mg total) by mouth at bedtime.   oxyCODONE (OXY IR/ROXICODONE) 5 MG immediate release tablet Take 0.5 tablets (2.5 mg total) by mouth every 6 (six) hours as needed for moderate pain or severe pain.   oxyCODONE-acetaminophen (PERCOCET) 5-325 MG tablet Take 1 tablet by mouth every 6 (six) hours as needed for severe pain.   No facility-administered encounter medications on file as of 05/31/2023.    Surgical History: Past Surgical History:  Procedure Laterality Date   ABDOMINAL HYSTERECTOMY     APPENDECTOMY     CARDIAC CATHETERIZATION     CARDIAC SURGERY     CHOLECYSTECTOMY     COLONOSCOPY  12/15/11   OH->bleeding internal hemorrhoids, otherwise normal   COLONOSCOPY WITH PROPOFOL N/A 04/12/2015   Procedure: COLONOSCOPY WITH PROPOFOL;  Surgeon: Midge Minium, MD;  Location: [facility] END[facility];  Service: Endoscopy;  Laterality: N/A;   COLONOSCOPY WITH PROPOFOL N/A 01/05/2020   Procedure: COLONOSCOPY WITH PROPOFOL;  Surgeon: Midge Minium,  MD;  Location: [facility];  Service: Endoscopy;  Laterality: N/A;   ENDARTERECTOMY FEMORAL Left 04/26/2015   Procedure: ENDARTERECTOMY FEMORAL;  Surgeon: Annice Needy, MD;  Location: [facility] ORS;  Service: Vascular;  Laterality: Left;   ENDOVASCULAR STENT INSERTION Bilateral    Legs   FLEXIBLE BRONCHOSCOPY N/A 02/02/2015   Procedure: FLEXIBLE BRONCHOSCOPY;  Surgeon: Yevonne Pax, MD;  Location: [facility] ORS;  Service: Pulmonary;   Laterality: N/A;   HEMORRHOID SURGERY     PERIPHERAL VASCULAR CATHETERIZATION Left 04/25/2015   Procedure: Lower Extremity Angiography;  Surgeon: Annice Needy, MD;  Location: [facility] CV [facility];  Service: Cardiovascular;  Laterality: Left;   PERIPHERAL VASCULAR CATHETERIZATION Left 04/25/2015   Procedure: Lower Extremity Intervention;  Surgeon: Annice Needy, MD;  Location: [facility];  Service: Cardiovascular;  Laterality: Left;   PERIPHERAL VASCULAR CATHETERIZATION Left 01/02/2016   Procedure: Lower Extremity Angiography;  Surgeon: Annice Needy, MD;  Location: [facility];  Service: Cardiovascular;  Laterality: Left;   PERIPHERAL VASCULAR CATHETERIZATION  01/02/2016   Procedure: Lower Extremity Intervention;  Surgeon: Annice Needy, MD;  Location: [facility];  Service: Cardiovascular;;   THROMBECTOMY FEMORAL ARTERY Left 04/26/2015   Procedure: THROMBECTOMY FEMORAL ARTERY;  Surgeon: Annice Needy, MD;  Location: [facility] OR[facility];  Service: Vascular;  Laterality: Left;    Medical History: Past Medical History:  Diagnosis Date   Allergy    Environmental   Arthritis    Asthma    Carotid artery stenosis    Carotid stenosis    COPD (chronic obstructive pulmonary disease) (HCC)    Coronary artery disease    Coronary artery disease    Hemorrhoids    Hyperlipidemia    Hypertension    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(HCC)    negative cardiac cath   Nicotine addiction    Peripheral vascular disease (HCC)    Pneumonia    Shortness of breath dyspnea    Tobacco abuse     Family History: Family History  Problem Relation Age of Onset   Hypertension Mother    Hypertension Father     Social History   Socioeconomic History   Marital status: Single    Spouse name: Not on file   Number of children: Not on file   Years of education: Not on file   Highest education level: Not on file  Occupational History   Not on file  Tobacco Use   Smoking status: Every Day    Current packs/day: 1.00     Average packs/day: 1 pack/day for 41.0 years (41.0 ttl pk-yrs)    Types: Cigarettes   Smokeless tobacco: Never   Tobacco comments:    Less than a 1/2 pack daily  Vaping Use   Vaping status: Never Used  Substance and Sexual Activity   Alcohol use: Yes    Alcohol/week: 0.0 standard drinks of alcohol    Comment: weekends, couple beers, pint of liquer only on weekends.  Last drank approx 1 month ago.   Drug use: No   Sexual activity: Not Currently  Other Topics Concern   Not on file  Social History Narrative   Lives with family    Social Determinants of Health   Financial Resource Strain: Low Risk  (03/21/2023)   Received from [facility]   Overall Financial Resource Strain (CARDIA)    Difficulty of Paying Living Expenses: Not hard at all  Food Insecurity: No Food Insecurity (05/16/2023)   Hunger Vital  Sign    Worried About Programme researcher, broadcasting/film/video in the Last Year: Never true    Ran Out of Food in the Last Year: Never true  Transportation Needs: No Transportation Needs (05/16/2023)   PRAPARE - Administrator, Civil Service (Medical): No    Lack of Transportation (Non-Medical): No  Physical Activity: Inactive (04/04/2018)   Exercise Vital Sign    Days of Exercise per Week: 0 days    Minutes of Exercise per Session: 0 min  Stress: No Stress Concern Present (04/04/2018)   [employer] of Occupational Health - Occupational Stress Questionnaire    Feeling of Stress : Only a little  Social Connections: Moderately Isolated (04/04/2018)   Social Connection and Isolation Panel [NHANES]    Frequency of Communication with Friends and Family: Once a week    Frequency of Social Gatherings with Friends and Family: Once a week    Attends Religious Services: 1 to 4 times per year    Active Member of [employer] or Organizations: No    Attends Banker Meetings: Never    Marital Status: Never married  Intimate Partner Violence: Not At Risk (04/04/2018)   Humiliation, Afraid,  Rape, and Kick questionnaire    Fear of Current or Ex-Partner: No    Emotionally Abused: No    Physically Abused: No    Sexually Abused: No      Review of Systems  Constitutional: Negative.   Respiratory: Negative.  Negative for cough, chest tightness, shortness of breath and wheezing.   Cardiovascular: Negative.  Negative for chest pain and palpitations.  Gastrointestinal:  Positive for abdominal pain (at the location of epigastric mass).    Vital Signs: Ht 5\' 4"  (1.626 m)   Wt 107 lb (48.5 kg)   LMP  (LMP Unknown)   BMI 18.37 kg/m    Observation/Objective: She is alert and oriented. No acute distress noted.     Assessment/Plan: 1. Hernia of anterior abdominal wall Referred to general surgery for further evaluation and possible treatment - Ambulatory referral to General Surgery   General Counseling: Macyn verbalizes understanding of the findings of today's phone visit and agrees with plan of treatment. I have discussed any further diagnostic evaluation that may be needed or ordered today. We also reviewed her medications today. she has been encouraged to call the office with any questions or concerns that should arise related to todays visit.  Return for reschedule annual wellness visit for next upcoming visit.   Orders Placed This Encounter  Procedures   Ambulatory referral to General Surgery    No orders of the defined types were placed in this encounter.   Time spent:10 Minutes Time spent with patient included reviewing progress notes, labs, imaging studies, and discussing plan for follow up.  [place] Controlled Substance Database was reviewed by me for overdose risk score (ORS) if appropriate.  This patient was seen by Sallyanne Kuster, FNP-C in collaboration with Dr. Beverely Risen as a part of collaborative care agreement.  Roberto Romanoski R. Tedd Sias, MSN, FNP-C Internal medicine

## 2023-06-04 ENCOUNTER — Telehealth: Payer: Self-pay | Admitting: Nurse Practitioner

## 2023-06-04 NOTE — Telephone Encounter (Signed)
Home health order faxed back to [facility]; [phone]. Scanned-Tonni

## 2023-06-11 ENCOUNTER — Encounter: Payer: Self-pay | Admitting: Emergency Medicine

## 2023-06-11 ENCOUNTER — Emergency Department (EMERGENCY_DEPARTMENT_HOSPITAL)
Admission: EM | Admit: 2023-06-11 | Discharge: 2023-06-12 | Disposition: A | Discharge status: Other Acute Inpatient Hospital | Payer: 59 | Source: Home / Self Care | Attending: Emergency Medicine | Admitting: Emergency Medicine

## 2023-06-11 ENCOUNTER — Other Ambulatory Visit: Payer: Self-pay

## 2023-06-11 DIAGNOSIS — Y908 Blood alcohol level of 240 mg/100 ml or more: Secondary | ICD-10-CM | POA: Insufficient documentation

## 2023-06-11 DIAGNOSIS — F1010 Alcohol abuse, uncomplicated: Secondary | ICD-10-CM | POA: Diagnosis present

## 2023-06-11 DIAGNOSIS — F332 Major depressive disorder, recurrent severe without psychotic features: Secondary | ICD-10-CM | POA: Insufficient documentation

## 2023-06-11 DIAGNOSIS — I2510 Atherosclerotic heart disease of native coronary artery without angina pectoris: Secondary | ICD-10-CM | POA: Insufficient documentation

## 2023-06-11 DIAGNOSIS — F17219 Nicotine dependence, cigarettes, with unspecified nicotine-induced disorders: Secondary | ICD-10-CM | POA: Diagnosis present

## 2023-06-11 DIAGNOSIS — I10 Essential (primary) hypertension: Secondary | ICD-10-CM | POA: Insufficient documentation

## 2023-06-11 DIAGNOSIS — R45851 Suicidal ideations: Secondary | ICD-10-CM

## 2023-06-11 DIAGNOSIS — F10120 Alcohol abuse with intoxication, uncomplicated: Secondary | ICD-10-CM | POA: Insufficient documentation

## 2023-06-11 DIAGNOSIS — J441 Chronic obstructive pulmonary disease with (acute) exacerbation: Secondary | ICD-10-CM | POA: Insufficient documentation

## 2023-06-11 DIAGNOSIS — F10929 Alcohol use, unspecified with intoxication, unspecified: Secondary | ICD-10-CM | POA: Diagnosis present

## 2023-06-11 DIAGNOSIS — F329 Major depressive disorder, single episode, unspecified: Secondary | ICD-10-CM | POA: Diagnosis not present

## 2023-06-11 DIAGNOSIS — R630 Anorexia: Secondary | ICD-10-CM | POA: Diagnosis present

## 2023-06-11 DIAGNOSIS — J449 Chronic obstructive pulmonary disease, unspecified: Secondary | ICD-10-CM | POA: Diagnosis present

## 2023-06-11 LAB — COMPREHENSIVE METABOLIC PANEL
ALT: 27 U/L (ref 0–44)
AST: 37 U/L (ref 15–41)
Albumin: 4 g/dL (ref 3.5–5.0)
Alkaline Phosphatase: 76 U/L (ref 38–126)
Anion gap: 15 (ref 5–15)
BUN: 16 mg/dL (ref 8–23)
CO2: 22 mmol/L (ref 22–32)
Calcium: 9.3 mg/dL (ref 8.9–10.3)
Chloride: 103 mmol/L (ref 98–111)
Creatinine, Ser: 0.9 mg/dL (ref 0.44–1.00)
GFR, Estimated: >60 mL/min (ref >60)
Glucose, Bld: 88 mg/dL (ref 70–99)
Potassium: 3.3 mmol/L — ABNORMAL LOW (ref 3.5–5.1)
Sodium: 140 mmol/L (ref 135–145)
Total Bilirubin: 0.5 mg/dL (ref 0.3–1.2)
Total Protein: 7.6 g/dL (ref 6.5–8.1)

## 2023-06-11 LAB — CBC
HCT: 37.2 % (ref 36.0–46.0)
Hemoglobin: 12.3 g/dL (ref 12.0–15.0)
MCH: 30.5 pg (ref 26.0–34.0)
MCHC: 33.1 g/dL (ref 30.0–36.0)
MCV: 92.3 fL (ref 80.0–100.0)
Platelets: 201 10*3/uL (ref 150–400)
RBC: 4.03 MIL/uL (ref 3.87–5.11)
RDW: 15.5 % (ref 11.5–15.5)
WBC: 5.5 10*3/uL (ref 4.0–10.5)
nRBC: 0 % (ref 0.0–0.2)

## 2023-06-11 LAB — SALICYLATE LEVEL: Salicylate Lvl: <7 mg/dL — ABNORMAL LOW (ref 7.0–30.0)

## 2023-06-11 LAB — ETHANOL: Alcohol, Ethyl (B): 402 mg/dL (ref <10)

## 2023-06-11 LAB — ACETAMINOPHEN LEVEL: Acetaminophen (Tylenol), Serum: <10 ug/mL — ABNORMAL LOW (ref 10–30)
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
## 2023-06-11 NOTE — ED Notes (Signed)
Pt stated they were hungry. Pt was given a dinner tray.

## 2023-06-11 NOTE — ED Triage Notes (Addendum)
Pt arrives by vehicle, dropped off to ED by "some white lady from an agency that picked me up". Pt endorses heavy ETOH use tonight, and states she is here because she is "torn between ending her life and not going through with it".   Additional collateral: this RN called and spoke to pt's daughter Marthenia Rolling [phone]). She states the pt has been increasingly having SI thoughts over the past few days and not been taking her meds. Says she has heard her mom say she wants to drink herself to death multiple times. She does state pt has stayed inpatient at [facility] in the past.

## 2023-06-11 NOTE — Consult Note (Signed)
Telepsych Conth sultation   Reason for Consult:  Psych Evaluation Referring Physician:  Dr. Arnoldo Morale Location of Patient:  Location of Provider: Other: remote office  Patient Identification: [name] MRN:  [id number] Principal Diagnosis: MDD (major depressive disorder), recurrent episode, severe (HCC) Diagnosis:  Principal Problem:   MDD (major depressive disorder), recurrent episode, severe (HCC) Active Problems:   Cigarette nicotine dependence with nicotine-induced disorder   Alcohol abuse   COPD exacerbation (HCC)   Poor appetite   Total Time spent with patient: 45 minutes  Subjective:  "Im an alcoholic and I smoke too damn much"   HPI:  Tele psych Assessment  [name], 67 y.o., female patient seen via tele health by TTS and this provider; chart reviewed and consulted with Dr. Katrinka Blazing on 06/12/23.  Per chart review, triage note states, pt arrives by vehicle, dropped off to ED by "some white lady from an agency that picked me up". Pt endorses heavy ETOH use tonight, and states she is here because she is "torn between ending her life and not going through with it".    Additional collateral: this RN called and spoke to pt's daughter Marthenia Rolling [phone]). She states the pt has been increasingly having SI thoughts over the past few days and not been taking her meds. Says she has heard her mom say she wants to drink herself to death multiple times. She does state pt has stayed inpatient at [facility] in the past.    On evaluation [name] reports "I lost my daughter in may 9,2023."  Patient becomes tearful.  Patient states she has been drinking more since her daughter passed.  One half gallon of liquor may last a few days now. She says the people in her house gets on "her f'n nerves".  She reports having cancer and having to have hemorrhoids. When asked if she want to hurt or kill herself, she reports, "I'm already doing that."  She reports that she is not eating or taking  medication.  All she say she does is drink and smoking.  She says she does not eat.  She doesn't care about living anymore.  Her house is really stressful for her, so she says she drinks her self to sleep.   During evaluation [name] is sitting in the assessment chair; she is alert/oriented x 4; anxious/labile/cooperative; and mood congruent with affect.  Patient is speaking in a clear tone at moderate volume, and normal pace; with minimal eye contact.  Her  thought process is coherent and relevant; There is no indication that he/she is currently responding to internal/external stimuli or experiencing delusional thought content.  Patient endorses passive suicidal/self-harm ideation.  Denies , remained calm throughout assessment and has answered questions appropriately.     Recommendations: Psych inpatient hospitalization  Dr. Arnoldo Morale informed of above recommendation and disposition    Past Psychiatric History: depression alcohol abuse  Risk to Self:   Risk to Others:   Prior Inpatient Therapy:   Prior Outpatient Therapy:    Past Medical History:  Past Medical History:  Diagnosis Date   Allergy    Environmental   Arthritis    Asthma    Carotid artery stenosis    Carotid stenosis    COPD (chronic obstructive pulmonary disease) (HCC)    Coronary artery disease    Coronary artery disease    Hemorrhoids    Hyperlipidemia    Hypertension    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[facility])    negative cardiac cath   Nicotine addiction  Peripheral vascular disease (HCC)    Pneumonia    Shortness of breath dyspnea    Tobacco abuse     Past Surgical History:  Procedure Laterality Date   ABDOMINAL HYSTERECTOMY     APPENDECTOMY     CARDIAC CATHETERIZATION     CARDIAC SURGERY     CHOLECYSTECTOMY     COLONOSCOPY  12/15/11   OH->bleeding internal hemorrhoids, otherwise normal   COLONOSCOPY WITH PROPOFOL N/A 04/12/2015   Procedure: COLONOSCOPY WITH PROPOFOL;  Surgeon: Midge Minium, MD;  Location: [facility]  ENDOSCOPY;  Service: Endoscopy;  Laterality: N/A;   COLONOSCOPY WITH PROPOFOL N/A 01/05/2020   Procedure: COLONOSCOPY WITH PROPOFOL;  Surgeon: Midge Minium, MD;  Location[facility] ENDOSCOPY;  Service: Endoscopy;  Laterality: N/A;   ENDARTERECTOMY FEMORAL Left 04/26/2015   Procedure: ENDARTERECTOMY FEMORAL;  Surgeon: Annice Needy, MD;  Location: [facility] ORS;  Service: Vascular;  Laterality: Left;   ENDOVASCULAR STENT INSERTION Bilateral    Legs   FLEXIBLE BRONCHOSCOPY N/A 02/02/2015   Procedure: FLEXIBLE BRONCHOSCOPY;  Surgeon: Yevonne Pax, MD;  Location: [facility] ORS;  Service: Pulmonary;  Laterality: N/A;   HEMORRHOID SURGERY     PERIPHERAL VASCULAR CATHETERIZATION Left 04/25/2015   Procedure: Lower Extremity Angiography;  Surgeon: Annice Needy, MD;  Location: [facility] INVASIVE CV LAB;  Service: Cardiovascular;  Laterality: Left;   PERIPHERAL VASCULAR CATHETERIZATION Left 04/25/2015   Procedure: Lower Extremity Intervention;  Surgeon: Annice Needy, MD;  Location: [facility] INVASIVE CV LAB;  Service: Cardiovascular;  Laterality: Left;   PERIPHERAL VASCULAR CATHETERIZATION Left 01/02/2016   Procedure: Lower Extremity Angiography;  Surgeon: Annice Needy, MD;  Location: [facility] INVASIVE CV LAB;  Service: Cardiovascular;  Laterality: Left;   PERIPHERAL VASCULAR CATHETERIZATION  01/02/2016   Procedure: Lower Extremity Intervention;  Surgeon: Annice Needy, MD;  Location: [facility] INVASIVE CV LAB;  Service: Cardiovascular;;   THROMBECTOMY FEMORAL ARTERY Left 04/26/2015   Procedure: THROMBECTOMY FEMORAL ARTERY;  Surgeon: Annice Needy, MD;  Location: [facility] ORS;  Service: Vascular;  Laterality: Left;   Family History:  Family History  Problem Relation Age of Onset   Hypertension Mother    Hypertension Father    Family Psychiatric  History: unknown Social History:  Social History   Substance and Sexual Activity  Alcohol Use Yes   Alcohol/week: 0.0 standard drinks of alcohol   Comment: weekends, couple beers, pint of liquer only on  weekends.  Last drank approx 1 month ago.     Social History   Substance and Sexual Activity  Drug Use No    Social History   Socioeconomic History   Marital status: Single    Spouse name: Not on file   Number of children: Not on file   Years of education: Not on file   Highest education level: Not on file  Occupational History   Not on file  Tobacco Use   Smoking status: Every Day    Current packs/day: 1.00    Average packs/day: 1 pack/day for 41.0 years (41.0 ttl pk-yrs)    Types: Cigarettes   Smokeless tobacco: Never   Tobacco comments:    Less than a 1/2 pack daily  Vaping Use   Vaping status: Never Used  Substance and Sexual Activity   Alcohol use: Yes    Alcohol/week: 0.0 standard drinks of alcohol    Comment: weekends, couple beers, pint of liquer only on weekends.  Last drank approx 1 month ago.   Drug use: No   Sexual activity: Not  Currently  Other Topics Concern   Not on file  Social History Narrative   Lives with family    Social Determinants of Health   Financial Resource Strain: Low Risk  (03/21/2023)   Received from [facility]   Overall Financial Resource Strain (CARDIA)    Difficulty of Paying Living Expenses: Not hard at all  Food Insecurity: No Food Insecurity (05/16/2023)   Hunger Vital Sign    Worried About Running Out of Food in the Last Year: Never true    Ran Out of Food in the Last Year: Never true  Transportation Needs: No Transportation Needs (05/16/2023)   PRAPARE - Administrator, Civil Service (Medical): No    Lack of Transportation (Non-Medical): No  Physical Activity: Inactive (04/04/2018)   Exercise Vital Sign    Days of Exercise per Week: 0 days    Minutes of Exercise per Session: 0 min  Stress: No Stress Concern Present (04/04/2018)   [employer] of Occupational Health - Occupational Stress Questionnaire    Feeling of Stress : Only a little  Social Connections: Moderately Isolated (04/04/2018)   Social  Connection and Isolation Panel [NHANES]    Frequency of Communication with Friends and Family: Once a week    Frequency of Social Gatherings with Friends and Family: Once a week    Attends Religious Services: 1 to 4 times per year    Active Member of [employer] or Organizations: No    Attends Banker Meetings: Never    Marital Status: Never married   Additional Social History:    Allergies:   Allergies  Allergen Reactions   Aspirin Nausea And Vomiting and Other (See Comments)    Pt states aspirin makes her cramp and have to use coated kind.   Tramadol    Zyrtec [Cetirizine]     Labs:  Results for orders placed or performed during the hospital encounter of 06/11/23 (from the past 48 hour(s))  Ethanol     Status: Abnormal   Collection Time: 06/11/23  8:25 PM  Result Value Ref Range   Alcohol, Ethyl (B) 402 (HH) <10 mg/dL    Comment: CRITICAL RESULT CALLED TO, READ BACK BY AND VERIFIED WITH AMY COYNE 06/11/23 2114 MU (NOTE) Lowest detectable limit for serum alcohol is 10 mg/dL.  For medical purposes only. Performed at [facility], [address] [place], [place] [address]   Comprehensive metabolic panel     Status: Abnormal   Collection Time: 06/11/23  8:26 PM  Result Value Ref Range   Sodium 140 135 - 145 mmol/L   Potassium 3.3 (L) 3.5 - 5.1 mmol/L   Chloride 103 98 - 111 mmol/L   CO2 22 22 - 32 mmol/L   Glucose, Bld 88 70 - 99 mg/dL    Comment: Glucose reference range applies only to samples taken after fasting for at least 8 hours.   BUN 16 8 - 23 mg/dL   Creatinine, Ser 6.04 0.44 - 1.00 mg/dL   Calcium 9.3 8.9 - 54.0 mg/dL   Total Protein 7.6 6.5 - 8.1 g/dL   Albumin 4.0 3.5 - 5.0 g/dL   AST 37 15 - 41 U/L   ALT 27 0 - 44 U/L   Alkaline Phosphatase 76 38 - 126 U/L   Total Bilirubin 0.5 0.3 - 1.2 mg/dL   GFR, Estimated >98 >11 mL/min    Comment: (NOTE) Calculated using the CKD-EPI Creatinine Equation (2021)    Anion gap 15  5 - 15     Comment: Performed at [facility], [address] [place], [place] [address]  Salicylate level     Status: Abnormal   Collection Time: 06/11/23  8:26 PM  Result Value Ref Range   Salicylate Lvl <7.0 (L) 7.0 - 30.0 mg/dL    Comment: Performed at [facility], [address] [place], [place] [address]  Acetaminophen level     Status: Abnormal   Collection Time: 06/11/23  8:26 PM  Result Value Ref Range   Acetaminophen (Tylenol), Serum <10 (L) 10 - 30 ug/mL    Comment: (NOTE) Therapeutic concentrations vary significantly. A range of 10-30 ug/mL  may be an effective concentration for many patients. However, some  are best treated at concentrations outside of this range. Acetaminophen concentrations >150 ug/mL at 4 hours after ingestion  and >50 ug/mL at 12 hours after ingestion are often associated with  toxic reactions.  Performed at [facility], [address] [place], [place] [address]   cbc     Status: None   Collection Time: 06/11/23  8:26 PM  Result Value Ref Range   WBC 5.5 4.0 - 10.5 K/uL   RBC 4.03 3.87 - 5.11 MIL/uL   Hemoglobin 12.3 12.0 - 15.0 g/dL   HCT 46.9 62.9 - 52.8 %   MCV 92.3 80.0 - 100.0 fL   MCH 30.5 26.0 - 34.0 pg   MCHC 33.1 30.0 - 36.0 g/dL   RDW 41.3 24.4 - 01.0 %   Platelets 201 150 - 400 K/uL   nRBC 0.0 0.0 - 0.2 %    Comment: Performed at [facility], [address] [place], [place] [address]    Medications:  No current facility-administered medications for this encounter.   Current Outpatient Medications  Medication Sig Dispense Refill   albuterol (VENTOLIN HFA) 108 (90 Base) MCG/ACT inhaler INHALE 2 PUFFS BY MOUTH EVERY 6 HOURS AS NEEDED FOR WHEEZING FOR SHORTNESS OF BREATH 9 g 0   aspirin EC 81 MG EC tablet Take 1 tablet (81 mg total) by mouth daily. 90 tablet 1   atorvastatin (LIPITOR) 80 MG tablet Take 1 tablet (80 mg total) by mouth daily. 90 tablet 3   budesonide-formoterol (SYMBICORT) 80-4.5  MCG/ACT inhaler INHALE 2 PUFFS BY MOUTH ONCE DAILY 22 g 2   butalbital-acetaminophen-caffeine (FIORICET) 50-325-40 MG tablet Take 1 tablet by mouth every 6 (six) hours as needed for headache. 14 tablet 4   cholecalciferol (VITAMIN D) 400 units TABS tablet Take 400 Units by mouth.     ezetimibe (ZETIA) 10 MG tablet Take 1 tablet (10 mg total) by mouth daily. 90 tablet 3   ferrous sulfate 325 (65 FE) MG tablet Take 325 mg by mouth daily.     losartan (COZAAR) 50 MG tablet Take 2 tablets (100 mg total) by mouth daily. 180 tablet 3   metoprolol succinate (TOPROL-XL) 50 MG 24 hr tablet TAKE 1 TABLET BY MOUTH ONCE DAILY WITH  OR  IMMEDIATELY  FOLLOWING  A  MEAL 90 tablet 0   mirtazapine (REMERON) 15 MG tablet Take 1 tablet (15 mg total) by mouth at bedtime. 30 tablet 3   oxyCODONE (OXY IR/ROXICODONE) 5 MG immediate release tablet Take 0.5 tablets (2.5 mg total) by mouth every 6 (six) hours as needed for moderate pain or severe pain. 30 tablet 0   oxyCODONE-acetaminophen (PERCOCET) 5-325 MG tablet Take 1 tablet by mouth every 6 (six) hours as needed for severe pain. 12  tablet 0    Musculoskeletal: Strength & Muscle Tone: within normal limits Gait & Station: normal Patient leans: N/A  Psychiatric Specialty Exam:  Presentation  General Appearance: Disheveled  Eye Contact:Fleeting  Speech:Garbled  Speech Volume:Decreased  Handedness:Right   Mood and Affect  Mood:Anxious; Depressed; Dysphoric; Hopeless; Irritable  Affect:Depressed; Flat   Thought Process  Thought Processes:Coherent  Descriptions of Associations:Intact  Orientation:Full (Time, Place and Person)  Thought Content:WDL  History of Schizophrenia/Schizoaffective disorder:No data recorded Duration of Psychotic Symptoms:No data recorded Hallucinations:Hallucinations: None  Ideas of Reference:None  Suicidal Thoughts:Suicidal Thoughts: Yes, Active SI Active Intent and/or Plan: With Intent  Homicidal  Thoughts:Homicidal Thoughts: No   Sensorium  Memory:Immediate Fair; Remote Fair  Judgment:Impaired  Insight:Lacking   Executive Functions  Concentration:Poor  Attention Span:Poor  Recall:Poor  Fund of Knowledge:Poor  Language:Poor   Psychomotor Activity  Psychomotor Activity:Psychomotor Activity: Normal   Assets  Assets:Desire for Improvement; Manufacturing systems engineer; Resilience   Sleep  Sleep:Sleep: Poor    Physical Exam: Physical Exam ROS Blood pressure 137/84, pulse 63, temperature 97.8 F (36.6 C), temperature source Oral, resp. rate 20, weight 48.5 kg, SpO2 94%. Body mass index is 18.35 kg/m.  Treatment Plan Summary: Daily contact with patient to assess and evaluate symptoms and progress in treatment, Medication management, and Plan  Baili Yuille was admitted to [facility] ER  for MDD (major depressive disorder), recurrent episode, severe (HCC), crisis management, and stabilization. Routine labs ordered, which include Lab Orders         Comprehensive metabolic panel         Ethanol         Salicylate level         Acetaminophen level         cbc         Urine Drug Screen, Qualitative    Medication Management: Medications started  Will maintain observation checks every 15 minutes for safety. Psychosocial education regarding relapse prevention and self-care; social and communication  Social work will consult with family for collateral information and discuss discharge and follow up plan.  Disposition: Recommend psychiatric Inpatient admission when medically cleared. Supportive therapy provided about ongoing stressors. Discussed crisis plan, support from social network, calling 911, coming to the Emergency Department, and calling Suicide Hotline.     Jearld Lesch, NP 06/12/2023 12:31 AM

## 2023-06-11 NOTE — ED Notes (Signed)
Dr Arnoldo Morale with pt

## 2023-06-11 NOTE — ED Notes (Signed)
Pt changed out into burgundy scrubs, all items placed in belonging bag, includes: - yellow shirt, brown pants, brown sandals, pink underwear  Wanded by security

## 2023-06-11 NOTE — ED Provider Notes (Signed)
[facility] Provider Note    Event Date/Time   First MD Initiated Contact with Patient 06/11/23 2057     (approximate)   History   Alcohol Intoxication   HPI  [name] is a 67 y.o. female past medical history significant for COPD, CAD, hypertension, hyperlipidemia, alcohol abuse, presents to the emergency department with suicidal ideation.  Patient presents to the emergency department after drinking heavily tonight.  States that she is here because she is thinking about killing herself.  States that she wants to drink enough alcohol in order to not be alive.  Endorses drinking a large amount of alcohol tonight.  States that she drinks half of a gallon and sometimes also drinks beer every other day.  Patient's daughter stated that she had been drinking heavily and having suicidal thoughts.  States that she has not been taking her medications for the past couple of days.       Physical Exam   Triage Vital Signs: ED Triage Vitals [06/11/23 2004]  Encounter Vitals Group     BP (!) 84/60     Systolic BP Percentile      Diastolic BP Percentile      Pulse Rate 79     Resp 20     Temp 97.8 F (36.6 C)     Temp Source Oral     SpO2 98 %     Weight 106 lb 14.8 oz (48.5 kg)     Height      Head Circumference      Peak Flow      Pain Score      Pain Loc      Pain Education      Exclude from Growth Chart     Most recent vital signs: Vitals:   06/11/23 2004 06/11/23 2108  BP: (!) 84/60 137/84  Pulse: 79 63  Resp: 20 20  Temp: 97.8 F (36.6 C)   SpO2: 98% 94%    Physical Exam Constitutional:      Appearance: She is well-developed.  HENT:     Head: Atraumatic.  Eyes:     Conjunctiva/sclera: Conjunctivae normal.  Cardiovascular:     Rate and Rhythm: Regular rhythm.  Pulmonary:     Effort: No respiratory distress.  Abdominal:     General: There is no distension.  Musculoskeletal:        General: Normal range of motion.     Cervical  back: Normal range of motion.  Skin:    General: Skin is warm.  Neurological:     Mental Status: She is alert. Mental status is at baseline.  Psychiatric:        Mood and Affect: Affect is inappropriate.        Speech: Speech is slurred.        Behavior: Behavior is cooperative.        Thought Content: Thought content includes suicidal ideation. Thought content includes suicidal plan.     IMPRESSION / MDM / ASSESSMENT AND PLAN / ED COURSE  I reviewed the triage vital signs and the nursing notes.  Patient presents to the emergency department with suicidal ideation.  Differential diagnosis including suicidal ideation, depression, intoxication, substance abuse, electrolyte abnormality    LABS (all labs ordered are listed, but only abnormal results are displayed) Labs interpreted as -    Labs Reviewed  COMPREHENSIVE METABOLIC PANEL - Abnormal; Notable for the following components:      Result Value  Potassium 3.3 (*)    All other components within normal limits  ETHANOL - Abnormal; Notable for the following components:   Alcohol, Ethyl (B) 402 (*)    All other components within normal limits  SALICYLATE LEVEL - Abnormal; Notable for the following components:   Salicylate Lvl <7.0 (*)    All other components within normal limits  ACETAMINOPHEN LEVEL - Abnormal; Notable for the following components:   Acetaminophen (Tylenol), Serum <10 (*)    All other components within normal limits  CBC  URINE DRUG SCREEN, QUALITATIVE ([facility] ONLY)    MDM  My evaluation patient appears acutely intoxicated.  Alcohol level significantly elevated at 400.  Patient with a nonfocal neurologic exam, have a low suspicion for intracranial hemorrhage.  Patient with suicidal ideation.  After my evaluation concern for patient's safety, involuntary commitment paperwork filled out.  No significant electrolyte abnormality or signs of hyponatremia.  Consulted psychiatry.   The patient has been placed in  psychiatric observation due to the need to provide a safe environment for the patient while obtaining psychiatric consultation and evaluation, as well as ongoing medical and medication management to treat the patient's condition.  The patient has been placed under full IVC at this time.   PROCEDURES:  Critical Care performed: No  Procedures  Patient's presentation is most consistent with acute presentation with potential threat to life or bodily function.   MEDICATIONS ORDERED IN ED: Medications - No data to display  FINAL CLINICAL IMPRESSION(S) / ED DIAGNOSES   Final diagnoses:  Alcoholic intoxication with complication (HCC)  Suicidal ideation     Rx / DC Orders   ED Discharge Orders     None        Note:  This document was prepared using Dragon voice recognition software and may include unintentional dictation errors.   Corena Herter, MD 06/11/23 2138

## 2023-06-11 NOTE — ED Notes (Signed)
Pt reports she is SI.  Pt denies HI. Pt states she is an alcoholic and drinks every day.  Pt states she has a lot of stress in her home.  Pt in hallway bed.

## 2023-06-11 NOTE — Consult Note (Incomplete)
Telepsych Conth sultation   Reason for Consult:  Psych Evaluation Referring Physician:  Dr. Arnoldo Morale Location of Patient:  Location of Provider: { [facility] Provider Location:30414014}  Patient Identification: [name] MRN:  [id number] Principal Diagnosis: <principal problem not specified> Diagnosis:  Active Problems:   * No active hospital problems. *   Total Time spent with patient: {Time; 15 min - 8 hours:17441}  Subjective:  "Im an alcoholic and I smoke too damn much"   HPI:  *** Patient reports "I lost my daughter in may 9,2023."  Patient becomes tearful.  Patient states she has been drinking more since her daughter passed.  One half gallon of liquor may last a few days now. She says the people in her house gets on "her f'n nerves".  She reports having cancer and having to have hemorrhoids. When asked if she want to hurt or kill herself, she reports, "I'm already doing that."  She reports that she is not eating or taking medication.  All she say she does is drink and smoking.  She says she does not eat.  She doesn't care about living anymore.  Her house is really stressful for her.  So she says she drinks her self to sleep.   Past Psychiatric History: ***  Risk to Self:   Risk to Others:   Prior Inpatient Therapy:   Prior Outpatient Therapy:    Past Medical History:  Past Medical History:  Diagnosis Date  . Allergy    Environmental  . Arthritis   . Asthma   . Carotid artery stenosis   . Carotid stenosis   . COPD (chronic obstructive pulmonary disease) (HCC)   . Coronary artery disease   . Coronary artery disease   . Hemorrhoids   . Hyperlipidemia   . Hypertension   . 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[facility])    negative cardiac cath  . Nicotine addiction   . Peripheral vascular disease (HCC)   . Pneumonia   . Shortness of breath dyspnea   . Tobacco abuse     Past Surgical History:  Procedure Laterality Date  . ABDOMINAL HYSTERECTOMY    . APPENDECTOMY    . CARDIAC  CATHETERIZATION    . CARDIAC SURGERY    . CHOLECYSTECTOMY    . COLONOSCOPY  12/15/11   OH->bleeding internal hemorrhoids, otherwise normal  . COLONOSCOPY WITH PROPOFOL N/A 04/12/2015   Procedure: COLONOSCOPY WITH PROPOFOL;  Surgeon: Midge Minium, MD;  Location: [facility] ENDOSCOPY;  Service: Endoscopy;  Laterality: N/A;  . COLONOSCOPY WITH PROPOFOL N/A 01/05/2020   Procedure: COLONOSCOPY WITH PROPOFOL;  Surgeon: Midge Minium, MD;  Location: [facility] ENDOSCOPY;  Service: Endoscopy;  Laterality: N/A;  . ENDARTERECTOMY FEMORAL Left 04/26/2015   Procedure: ENDARTERECTOMY FEMORAL;  Surgeon: Annice Needy, MD;  Location: [facility] ORS;  Service: Vascular;  Laterality: Left;  . ENDOVASCULAR STENT INSERTION Bilateral    Legs  . FLEXIBLE BRONCHOSCOPY N/A 02/02/2015   Procedure: FLEXIBLE BRONCHOSCOPY;  Surgeon: Yevonne Pax, MD;  Location: [facility] ORS;  Service: Pulmonary;  Laterality: N/A;  . HEMORRHOID SURGERY    . PERIPHERAL VASCULAR CATHETERIZATION Left 04/25/2015   Procedure: Lower Extremity Angiography;  Surgeon: Annice Needy, MD;  Location: [facility] INVASIVE CV LAB;  Service: Cardiovascular;  Laterality: Left;  . PERIPHERAL VASCULAR CATHETERIZATION Left 04/25/2015   Procedure: Lower Extremity Intervention;  Surgeon: Annice Needy, MD;  Location: [facility] INVASIVE CV LAB;  Service: Cardiovascular;  Laterality: Left;  . PERIPHERAL VASCULAR CATHETERIZATION Left 01/02/2016   Procedure: Lower Extremity Angiography;  Surgeon: Annice Needy, MD;  Location: [facility] INVASIVE CV LAB;  Service: Cardiovascular;  Laterality: Left;  . PERIPHERAL VASCULAR CATHETERIZATION  01/02/2016   Procedure: Lower Extremity Intervention;  Surgeon: Annice Needy, MD;  Location: [facility] INVASIVE CV LAB;  Service: Cardiovascular;;  . THROMBECTOMY FEMORAL ARTERY Left 04/26/2015   Procedure: THROMBECTOMY FEMORAL ARTERY;  Surgeon: Annice Needy, MD;  Location: [facility] ORS;  Service: Vascular;  Laterality: Left;   Family History:  Family History  Problem Relation Age of Onset  .  Hypertension Mother   . Hypertension Father    Family Psychiatric  History: *** Social History:  Social History   Substance and Sexual Activity  Alcohol Use Yes  . Alcohol/week: 0.0 standard drinks of alcohol   Comment: weekends, couple beers, pint of liquer only on weekends.  Last drank approx 1 month ago.     Social History   Substance and Sexual Activity  Drug Use No    Social History   Socioeconomic History  . Marital status: Single    Spouse name: Not on file  . Number of children: Not on file  . Years of education: Not on file  . Highest education level: Not on file  Occupational History  . Not on file  Tobacco Use  . Smoking status: Every Day    Current packs/day: 1.00    Average packs/day: 1 pack/day for 41.0 years (41.0 ttl pk-yrs)    Types: Cigarettes  . Smokeless tobacco: Never  . Tobacco comments:    Less than a 1/2 pack daily  Vaping Use  . Vaping status: Never Used  Substance and Sexual Activity  . Alcohol use: Yes    Alcohol/week: 0.0 standard drinks of alcohol    Comment: weekends, couple beers, pint of liquer only on weekends.  Last drank approx 1 month ago.  . Drug use: No  . Sexual activity: Not Currently  Other Topics Concern  . Not on file  Social History Narrative   Lives with family    Social Determinants of Health   Financial Resource Strain: Low Risk  (03/21/2023)   [facility]   Overall Financial Resource Strain (CARDIA)   . Difficulty of Paying Living Expenses: Not hard at all  Food Insecurity: No Food Insecurity (05/16/2023)   Hunger Vital Sign   . Worried About Programme researcher, broadcasting/film/video in the Last Year: Never true   . Ran Out of Food in the Last Year: Never true  Transportation Needs: No Transportation Needs (05/16/2023)   PRAPARE - Transportation   . Lack of Transportation (Medical): No   . Lack of Transportation (Non-Medical): No  Physical Activity: Inactive (04/04/2018)   Exercise Vital Sign   . Days of Exercise  per Week: 0 days   . Minutes of Exercise per Session: 0 min  Stress: No Stress Concern Present (04/04/2018)   [employer] of Occupational Health - Occupational Stress Questionnaire   . Feeling of Stress : Only a little  Social Connections: Moderately Isolated (04/04/2018)   Social Connection and Isolation Panel [NHANES]   . Frequency of Communication with Friends and Family: Once a week   . Frequency of Social Gatherings with Friends and Family: Once a week   . Attends Religious Services: 1 to 4 times per year   . Active Member of Clubs or Organizations: No   . Attends Banker Meetings: Never   . Marital Status: Never married   Additional Social History:  Allergies:   Allergies  Allergen Reactions  . Aspirin Nausea And Vomiting and Other (See Comments)    Pt states aspirin makes her cramp and have to use coated kind.  . Tramadol   . Zyrtec [Cetirizine]     Labs:  Results for orders placed or performed during the hospital encounter of 06/11/23 (from the past 48 hour(s))  Ethanol     Status: Abnormal   Collection Time: 06/11/23  8:25 PM  Result Value Ref Range   Alcohol, Ethyl (B) 402 (HH) <10 mg/dL    Comment: CRITICAL RESULT CALLED TO, READ BACK BY AND VERIFIED WITH AMY COYNE 06/11/23 2114 MU (NOTE) Lowest detectable limit for serum alcohol is 10 mg/dL.  For medical purposes only. Performed at [facility], [address] [place], [place] [address]   Comprehensive metabolic panel     Status: Abnormal   Collection Time: 06/11/23  8:26 PM  Result Value Ref Range   Sodium 140 135 - 145 mmol/L   Potassium 3.3 (L) 3.5 - 5.1 mmol/L   Chloride 103 98 - 111 mmol/L   CO2 22 22 - 32 mmol/L   Glucose, Bld 88 70 - 99 mg/dL    Comment: Glucose reference range applies only to samples taken after fasting for at least 8 hours.   BUN 16 8 - 23 mg/dL   Creatinine, Ser 1.30 0.44 - 1.00 mg/dL   Calcium 9.3 8.9 - 86.5 mg/dL   Total Protein 7.6 6.5 - 8.1 g/dL    Albumin 4.0 3.5 - 5.0 g/dL   AST 37 15 - 41 U/L   ALT 27 0 - 44 U/L   Alkaline Phosphatase 76 38 - 126 U/L   Total Bilirubin 0.5 0.3 - 1.2 mg/dL   GFR, Estimated >78 >46 mL/min    Comment: (NOTE) Calculated using the CKD-EPI Creatinine Equation (2021)    Anion gap 15 5 - 15    Comment: Performed at [facility], [address], [place], [place] [address]  Salicylate level     Status: Abnormal   Collection Time: 06/11/23  8:26 PM  Result Value Ref Range   Salicylate Lvl <7.0 (L) 7.0 - 30.0 mg/dL    Comment: Performed at [facility], [address] [place], [place] [address]  Acetaminophen level     Status: Abnormal   Collection Time: 06/11/23  8:26 PM  Result Value Ref Range   Acetaminophen (Tylenol), Serum <10 (L) 10 - 30 ug/mL    Comment: (NOTE) Therapeutic concentrations vary significantly. A range of 10-30 ug/mL  may be an effective concentration for many patients. However, some  are best treated at concentrations outside of this range. Acetaminophen concentrations >150 ug/mL at 4 hours after ingestion  and >50 ug/mL at 12 hours after ingestion are often associated with  toxic reactions.  Performed at [facility], [address] [place], [place] [address]   cbc     Status: None   Collection Time: 06/11/23  8:26 PM  Result Value Ref Range   WBC 5.5 4.0 - 10.5 K/uL   RBC 4.03 3.87 - 5.11 MIL/uL   Hemoglobin 12.3 12.0 - 15.0 g/dL   HCT 02.7 25.3 - 66.4 %   MCV 92.3 80.0 - 100.0 fL   MCH 30.5 26.0 - 34.0 pg   MCHC 33.1 30.0 - 36.0 g/dL   RDW 40.3 47.4 - 25.9 %   Platelets 201 150 - 400 K/uL   nRBC 0.0 0.0 - 0.2 %  Comment: Performed at [facility], [address] [place], [place] [address]    Medications:  No current facility-administered medications for this encounter.   Current Outpatient Medications  Medication Sig Dispense Refill  . albuterol (VENTOLIN HFA) 108 (90 Base) MCG/ACT inhaler INHALE 2 PUFFS BY MOUTH  EVERY 6 HOURS AS NEEDED FOR WHEEZING FOR SHORTNESS OF BREATH 9 g 0  . aspirin EC 81 MG EC tablet Take 1 tablet (81 mg total) by mouth daily. 90 tablet 1  . atorvastatin (LIPITOR) 80 MG tablet Take 1 tablet (80 mg total) by mouth daily. 90 tablet 3  . budesonide-formoterol (SYMBICORT) 80-4.5 MCG/ACT inhaler INHALE 2 PUFFS BY MOUTH ONCE DAILY 22 g 2  . butalbital-acetaminophen-caffeine (FIORICET) 50-325-40 MG tablet Take 1 tablet by mouth every 6 (six) hours as needed for headache. 14 tablet 4  . cholecalciferol (VITAMIN D) 400 units TABS tablet Take 400 Units by mouth.    . ezetimibe (ZETIA) 10 MG tablet Take 1 tablet (10 mg total) by mouth daily. 90 tablet 3  . ferrous sulfate 325 (65 FE) MG tablet Take 325 mg by mouth daily.    Marland Kitchen losartan (COZAAR) 50 MG tablet Take 2 tablets (100 mg total) by mouth daily. 180 tablet 3  . metoprolol succinate (TOPROL-XL) 50 MG 24 hr tablet TAKE 1 TABLET BY MOUTH ONCE DAILY WITH  OR  IMMEDIATELY  FOLLOWING  A  MEAL 90 tablet 0  . mirtazapine (REMERON) 15 MG tablet Take 1 tablet (15 mg total) by mouth at bedtime. 30 tablet 3  . oxyCODONE (OXY IR/ROXICODONE) 5 MG immediate release tablet Take 0.5 tablets (2.5 mg total) by mouth every 6 (six) hours as needed for moderate pain or severe pain. 30 tablet 0  . oxyCODONE-acetaminophen (PERCOCET) 5-325 MG tablet Take 1 tablet by mouth every 6 (six) hours as needed for severe pain. 12 tablet 0    Musculoskeletal: Strength & Muscle Tone: {desc; muscle tone:32375} Gait & Station: {PE GAIT ED UJWJ:19147} Patient leans: {Patient Leans:21022755}          Psychiatric Specialty Exam:  Presentation  General Appearance: No data recorded Eye Contact:No data recorded Speech:No data recorded Speech Volume:No data recorded Handedness:No data recorded  Mood and Affect  Mood:No data recorded Affect:No data recorded  Thought Process  Thought Processes:No data recorded Descriptions of Associations:No data  recorded Orientation:No data recorded Thought Content:No data recorded History of Schizophrenia/Schizoaffective disorder:No data recorded Duration of Psychotic Symptoms:No data recorded Hallucinations:No data recorded Ideas of Reference:No data recorded Suicidal Thoughts:No data recorded Homicidal Thoughts:No data recorded  Sensorium  Memory:No data recorded Judgment:No data recorded Insight:No data recorded  Executive Functions  Concentration:No data recorded Attention Span:No data recorded Recall:No data recorded Fund of Knowledge:No data recorded Language:No data recorded  Psychomotor Activity  Psychomotor Activity:No data recorded  Assets  Assets:No data recorded  Sleep  Sleep:No data recorded   Physical Exam: Physical Exam ROS Blood pressure 137/84, pulse 63, temperature 97.8 F (36.6 C), temperature source Oral, resp. rate 20, weight 48.5 kg, SpO2 94%. Body mass index is 18.35 kg/m.  Treatment Plan Summary: {CHL [facility] MD [place] WGNF:[id number]}  Disposition: {CHL [facility] Consult Plan:20772}  This service was provided via telemedicine using a 2-way, interactive audio and video technology.  Names of all persons participating in this telemedicine service and their role in this encounter. Name: *** Role: ***  Name: *** Role: ***  Name: *** Role: ***  Name: *** Role: ***    Jearld Lesch, NP 06/11/2023  9:56 PM

## 2023-06-12 ENCOUNTER — Inpatient Hospital Stay
Admission: AD | Admit: 2023-06-12 | Discharge: 2023-06-20 | DRG: 885 | Disposition: A | Discharge status: Home / Self Care | Payer: 59 | Source: Ambulatory Visit | Attending: Psychiatry | Admitting: Psychiatry

## 2023-06-12 ENCOUNTER — Encounter: Payer: Self-pay | Admitting: Psychiatry

## 2023-06-12 ENCOUNTER — Ambulatory Visit: Payer: 59 | Admitting: Surgery

## 2023-06-12 DIAGNOSIS — Y908 Blood alcohol level of 240 mg/100 ml or more: Secondary | ICD-10-CM | POA: Diagnosis present

## 2023-06-12 DIAGNOSIS — Z7951 Long term (current) use of inhaled steroids: Secondary | ICD-10-CM | POA: Diagnosis not present

## 2023-06-12 DIAGNOSIS — Z7982 Long term (current) use of aspirin: Secondary | ICD-10-CM

## 2023-06-12 DIAGNOSIS — Z888 Allergy status to other drugs, medicaments and biological substances status: Secondary | ICD-10-CM | POA: Diagnosis not present

## 2023-06-12 DIAGNOSIS — I739 Peripheral vascular disease, unspecified: Secondary | ICD-10-CM | POA: Diagnosis present

## 2023-06-12 DIAGNOSIS — F17210 Nicotine dependence, cigarettes, uncomplicated: Secondary | ICD-10-CM | POA: Diagnosis present

## 2023-06-12 DIAGNOSIS — R45851 Suicidal ideations: Secondary | ICD-10-CM | POA: Diagnosis present

## 2023-06-12 DIAGNOSIS — R630 Anorexia: Secondary | ICD-10-CM | POA: Diagnosis present

## 2023-06-12 DIAGNOSIS — I252 Old myocardial infarction: Secondary | ICD-10-CM | POA: Diagnosis not present

## 2023-06-12 DIAGNOSIS — F1010 Alcohol abuse, uncomplicated: Secondary | ICD-10-CM | POA: Diagnosis not present

## 2023-06-12 DIAGNOSIS — J449 Chronic obstructive pulmonary disease, unspecified: Secondary | ICD-10-CM | POA: Diagnosis present

## 2023-06-12 DIAGNOSIS — Z886 Allergy status to analgesic agent status: Secondary | ICD-10-CM | POA: Diagnosis not present

## 2023-06-12 DIAGNOSIS — E785 Hyperlipidemia, unspecified: Secondary | ICD-10-CM | POA: Diagnosis present

## 2023-06-12 DIAGNOSIS — F329 Major depressive disorder, single episode, unspecified: Secondary | ICD-10-CM | POA: Diagnosis present

## 2023-06-12 DIAGNOSIS — F10129 Alcohol abuse with intoxication, unspecified: Secondary | ICD-10-CM | POA: Diagnosis present

## 2023-06-12 DIAGNOSIS — Z885 Allergy status to narcotic agent status: Secondary | ICD-10-CM

## 2023-06-12 DIAGNOSIS — Z91148 Patient's other noncompliance with medication regimen for other reason: Secondary | ICD-10-CM | POA: Diagnosis not present

## 2023-06-12 DIAGNOSIS — Z681 Body mass index (BMI) 19 or less, adult: Secondary | ICD-10-CM

## 2023-06-12 DIAGNOSIS — I2510 Atherosclerotic heart disease of native coronary artery without angina pectoris: Secondary | ICD-10-CM | POA: Diagnosis present

## 2023-06-12 DIAGNOSIS — R4587 Impulsiveness: Secondary | ICD-10-CM | POA: Diagnosis present

## 2023-06-12 DIAGNOSIS — Z79899 Other long term (current) drug therapy: Secondary | ICD-10-CM | POA: Diagnosis not present

## 2023-06-12 DIAGNOSIS — I10 Essential (primary) hypertension: Secondary | ICD-10-CM | POA: Diagnosis present

## 2023-06-12 DIAGNOSIS — F419 Anxiety disorder, unspecified: Secondary | ICD-10-CM | POA: Diagnosis present

## 2023-06-12 DIAGNOSIS — F332 Major depressive disorder, recurrent severe without psychotic features: Secondary | ICD-10-CM | POA: Insufficient documentation

## 2023-06-12 DIAGNOSIS — Z8249 Family history of ischemic heart disease and other diseases of the circulatory system: Secondary | ICD-10-CM | POA: Diagnosis not present

## 2023-06-12 DIAGNOSIS — F32A Depression, unspecified: Secondary | ICD-10-CM | POA: Diagnosis present

## 2023-06-12 MED ORDER — HALOPERIDOL 5 MG PO TABS: 5.0000 mg | ORAL_TABLET | Freq: Three times a day (TID) | ORAL | Status: DC | PRN

## 2023-06-12 MED ORDER — NICOTINE 14 MG/24HR TD PT24
14.0000 mg | MEDICATED_PATCH | Freq: Every day | TRANSDERMAL | Status: DC
  Administered 2023-06-14 – 2023-06-20 (×7): 14 mg via TRANSDERMAL
  Filled 2023-06-12 (×8): qty 1

## 2023-06-12 MED ORDER — LORAZEPAM 1 MG PO TABS: 2.0000 mg | ORAL_TABLET | Freq: Three times a day (TID) | ORAL | Status: DC | PRN

## 2023-06-12 MED ORDER — HALOPERIDOL LACTATE 5 MG/ML IJ SOLN: 5.0000 mg | Freq: Three times a day (TID) | INTRAMUSCULAR | Status: DC | PRN

## 2023-06-12 MED ORDER — LORAZEPAM 2 MG/ML IJ SOLN: 2.0000 mg | Freq: Three times a day (TID) | INTRAMUSCULAR | Status: DC | PRN

## 2023-06-12 MED ORDER — DIPHENHYDRAMINE HCL 25 MG PO CAPS: 50.0000 mg | ORAL_CAPSULE | Freq: Three times a day (TID) | ORAL | Status: DC | PRN

## 2023-06-12 MED ORDER — DIPHENHYDRAMINE HCL 50 MG/ML IJ SOLN: 50.0000 mg | Freq: Three times a day (TID) | INTRAMUSCULAR | Status: DC | PRN

## 2023-06-12 MED ORDER — FOLIC ACID 1 MG PO TABS
1.0000 mg | ORAL_TABLET | Freq: Every day | ORAL | Status: DC
  Administered 2023-06-12 – 2023-06-20 (×9): 1 mg via ORAL
  Filled 2023-06-12 (×9): qty 1

## 2023-06-12 MED ORDER — LORAZEPAM 1 MG PO TABS: 1.0000 mg | ORAL_TABLET | ORAL | Status: DC | PRN

## 2023-06-12 MED ORDER — DIAZEPAM 5 MG PO TABS
5.0000 mg | ORAL_TABLET | Freq: Two times a day (BID) | ORAL | Status: DC
  Administered 2023-06-12 – 2023-06-14 (×4): 5 mg via ORAL
  Filled 2023-06-12 (×4): qty 1

## 2023-06-12 MED ORDER — ALBUTEROL SULFATE HFA 108 (90 BASE) MCG/ACT IN AERS: 2.0000 | INHALATION_SPRAY | Freq: Four times a day (QID) | RESPIRATORY_TRACT | Status: DC | PRN

## 2023-06-12 MED ORDER — TRAZODONE HCL 50 MG PO TABS
50.0000 mg | ORAL_TABLET | Freq: Every evening | ORAL | Status: DC | PRN
  Administered 2023-06-12 – 2023-06-19 (×2): 50 mg via ORAL
  Filled 2023-06-12 (×2): qty 1

## 2023-06-12 MED ORDER — ACETAMINOPHEN 325 MG PO TABS
650.0000 mg | ORAL_TABLET | Freq: Four times a day (QID) | ORAL | Status: DC | PRN
  Administered 2023-06-12 – 2023-06-19 (×4): 650 mg via ORAL
  Filled 2023-06-12 (×5): qty 2

## 2023-06-12 MED ORDER — ALUM & MAG HYDROXIDE-SIMETH 200-200-20 MG/5ML PO SUSP: 30.0000 mL | ORAL | Status: DC | PRN

## 2023-06-12 MED ORDER — THIAMINE MONONITRATE 100 MG PO TABS
100.0000 mg | ORAL_TABLET | Freq: Every day | ORAL | Status: DC
  Administered 2023-06-12 – 2023-06-20 (×9): 100 mg via ORAL
  Filled 2023-06-12 (×9): qty 1

## 2023-06-12 MED ORDER — THIAMINE HCL 100 MG/ML IJ SOLN: 100.0000 mg | Freq: Every day | INTRAMUSCULAR | Status: DC

## 2023-06-12 MED ORDER — MAGNESIUM HYDROXIDE 400 MG/5ML PO SUSP: 30.0000 mL | Freq: Every day | ORAL | Status: DC | PRN

## 2023-06-12 MED ORDER — ADULT MULTIVITAMIN W/MINERALS CH
1.0000 | ORAL_TABLET | Freq: Every day | ORAL | Status: DC
  Administered 2023-06-12 – 2023-06-20 (×9): 1 via ORAL
  Filled 2023-06-12 (×9): qty 1

## 2023-06-12 NOTE — ED Notes (Signed)
Report to Pattricia Boss, Charity fundraiser. Pt moved to [employer] via wheelchair by Connye Burkitt, Financial planner. Pt ambulates safely and appropriately for age. Secretary notified of move.

## 2023-06-12 NOTE — ED Notes (Signed)
IVC/ Recommend Psych inpatient hospitalization

## 2023-06-12 NOTE — Progress Notes (Addendum)
   06/12/23 1501  Psych Admission Type (Psych Patients Only)  Admission Status Involuntary  Psychosocial Assessment  Patient Complaints Substance abuse;Anxiety;Depression  Eye Contact Fair  Facial Expression Flat  Affect Flat  Speech Soft  Interaction Assertive  Motor Activity Slow  Appearance/Hygiene In scrubs  Behavior Characteristics Cooperative;Anxious  Mood Depressed;Anxious  Thought Process  Coherency WDL  Content WDL  Delusions None reported or observed  Perception WDL  Hallucination None reported or observed  Judgment WDL  Confusion WDL  Danger to Self  Current suicidal ideation? Denies  Danger to Others  Danger to Others None reported or observed   Arrived unit with security and nurse escort from the ED. All admission questions answered. Denies any SI/HI/AVH. Started on CIWA protocol. CIWA at this time is 4. No withdrawal symptoms noted. On assessment, bulging area noted to right flank with pain per patient.

## 2023-06-12 NOTE — Group Note (Signed)
Date:  06/12/2023 Time:  8:42 PM  Group Topic/Focus:  Goals Group:   The focus of this group is to help patients establish daily goals to achieve during treatment and discuss how the patient can incorporate goal setting into their daily lives to aide in recovery. Self Esteem Action Plan:   The focus of this group is to help patients create a plan to continue to build self-esteem after discharge.    Participation Level:  Did Not Attend  Additional Comments:    Osker Mason 06/12/2023, 8:42 PM

## 2023-06-12 NOTE — ED Notes (Signed)
Hospital meal provided, pt tolerated w/o complaints.  Waste discarded appropriately.  

## 2023-06-12 NOTE — BH Assessment (Signed)
Patient has been accepted to [facility] on today 06/12/23 Attending Physician will be Dr. Marlou Porch Patient has been assigned to room L35  ER Staff is aware of it:  [facility] ER Secretary  Dr. Sherron Flemings, ER MD  Florentina Addison Patient's Nurse

## 2023-06-12 NOTE — ED Notes (Signed)
Pt. Transferred to BHU , room# 6 from ED .Patient was screened by security before entering the unit. Received Report and Recommendations from Hillery Aldo, RN . Pt. Oriented to unit including Q15 minute rounding as well as locked bathroom protocol, meal / snack schedule, and  the security cameras in place for their protection. Patient is A/O x 4, warm / dry.  Showing no acute signs of distress. Staff to monitor as ordered

## 2023-06-12 NOTE — Progress Notes (Signed)
   06/12/23 2000  Psych Admission Type (Psych Patients Only)  Admission Status Involuntary  Psychosocial Assessment  Patient Complaints Substance abuse  Eye Contact Fair  Facial Expression Flat  Affect Flat  Speech Soft  Interaction Assertive  Motor Activity Slow  Appearance/Hygiene In scrubs  Behavior Characteristics Cooperative;Anxious  Mood Anxious  Thought Process  Coherency WDL  Content WDL  Delusions None reported or observed  Perception WDL  Hallucination None reported or observed  Judgment WDL  Confusion None  Danger to Self  Current suicidal ideation? Denies  Danger to Others  Danger to Others None reported or observed   Progress note   D: Pt seen in her room. Pt denies SI, HI, AVH. Pt rates pain  9/10 as surgical pain. 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 Pt rates anxiety  0/10 and depression  0/10. Pt states she drinks most days of the week normally. Explained the process of withdrawal and possible symptoms to pt. Pt verbalized understanding. Call bell within reach. Pt has walker next to bed. Pt states that sometimes she is unsteady. Endorses trouble sleeping. Denies withdrawal symptoms at this time. Flat affect. No other concerns noted at this time.  A: Pt provided support and encouragement. Pt given scheduled medication as prescribed. PRNs as appropriate. Q15 min checks for safety.   R: Pt safe on the unit. Will continue to monitor.

## 2023-06-12 NOTE — Group Note (Signed)
Recreation Therapy Group Note   Group Topic:General Recreation  Group Date: 06/12/2023 Start Time: 1410 End Time: 1455 Facilitators: Rosina Lowenstein, LRT, CTRS Location: Courtyard  Group Description: Outdoor Recreation. Patients had the option to play the card game rummy, ring toss, and listen to music while outside in the courtyard getting fresh air and sunlight. LRT and pts discussed things that they enjoy doing in their free time outside of the hospital.   Goal Area(s) Addressed: Patient will identify leisure interests.  Patient will practice healthy decision making. Patient will engage in recreation activity.   Affect/Mood: N/A   Participation Level: Did not attend    Clinical Observations/Individualized Feedback: [name] did not attend group.  Plan: Continue to engage patient in RT group sessions 2-3x/week.   Rosina Lowenstein, LRT, CTRS 06/12/2023 3:13 PM

## 2023-06-12 NOTE — Consult Note (Signed)
ED Face-to-Face Psychiatry Consult follow-up    Reason for Consult:  Psych Evaluation Referring Physician:  Dr. Arnoldo Morale  Patient Identification: [name] MRN:  [id number] Principal Diagnosis: MDD (major depressive disorder), recurrent episode, severe (HCC) Diagnosis:  Principal Problem:   MDD (major depressive disorder), recurrent episode, severe (HCC) Active Problems:   Cigarette nicotine dependence with nicotine-induced disorder   Alcohol abuse   COPD exacerbation (HCC)   Alcoholic intoxication with complication (HCC)   Poor appetite   Suicidal ideation   Total Time spent with patient: 15 minutes  Subjective:   [name], 67 y.o., female patient seen via tele health by TTS and this provider; chart reviewed and consulted with Dr. Katrinka Blazing on 06/12/23.  Per chart review, triage note states, pt arrives by vehicle, dropped off to ED by "some white lady from an agency that picked me up". Pt endorses heavy ETOH use tonight, and states she is here because she is "torn between ending her life and not going through with it".   Patient reports that she is still depressed and sad. She is tearful.  Reports anhedonia.  Reports high level of anxiety.  Poor sleep, poor appetite and weight loss.  Demonstrates poor concentration.  Reports that she continues to have suicidal thoughts but no longer any plan or intent at this time.  Denies HI.  Reports that she still feels like she is withdrawing from alcohol.  She reports drinking for quite some time, is not specific about details of her alcohol use history including frequency, average amount, alcohol of choice.  She had very high BAL on admission.  She reports she is drinking daily, if possible, and whenever she can find to drink.  Recommendations: Psych inpatient hospitalization    Previously obtained collateral: RN called and spoke to pt's daughter Marthenia Rolling [phone]). She states the pt has been increasingly having SI thoughts over the past  few days and not been taking her meds. Says she has heard her mom say she wants to drink herself to death multiple times. She does state pt has stayed inpatient at [facility] in the past.     Risk to Self:  Yes Risk to Others:  Denies Prior Inpatient Therapy:  Reports many years ago, patient is unsure why she was admitted there Prior Outpatient Therapy:    Past Medical History:  Past Medical History:  Diagnosis Date   Allergy    Environmental   Arthritis    Asthma    Carotid artery stenosis    Carotid stenosis    COPD (chronic obstructive pulmonary disease) (HCC)    Coronary artery disease    Coronary artery disease    Hemorrhoids    Hyperlipidemia    Hypertension    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(HCC)    negative cardiac cath   Nicotine addiction    Peripheral vascular disease (HCC)    Pneumonia    Shortness of breath dyspnea    Tobacco abuse     Past Surgical History:  Procedure Laterality Date   ABDOMINAL HYSTERECTOMY     APPENDECTOMY     CARDIAC CATHETERIZATION     CARDIAC SURGERY     CHOLECYSTECTOMY     COLONOSCOPY  12/15/11   OH->bleeding internal hemorrhoids, otherwise normal   COLONOSCOPY WITH PROPOFOL N/A 04/12/2015   Procedure: COLONOSCOPY WITH PROPOFOL;  Surgeon: Midge Minium, MD;  Location: [facility] ENDOSCOPY;  Service: Endoscopy;  Laterality: N/A;   COLONOSCOPY WITH PROPOFOL N/A 01/05/2020   Procedure: COLONOSCOPY WITH PROPOFOL;  Surgeon: Midge Minium,  MD;  Location: [facility] ENDOSCOPY;  Service: Endoscopy;  Laterality: N/A;   ENDARTERECTOMY FEMORAL Left 04/26/2015   Procedure: ENDARTERECTOMY FEMORAL;  Surgeon: Annice Needy, MD;  Location: [facility] ORS;  Service: Vascular;  Laterality: Left;   ENDOVASCULAR STENT INSERTION Bilateral    Legs   FLEXIBLE BRONCHOSCOPY N/A 02/02/2015   Procedure: FLEXIBLE BRONCHOSCOPY;  Surgeon: Yevonne Pax, MD;  Location: [facility] ORS;  Service: Pulmonary;  Laterality: N/A;   HEMORRHOID SURGERY     PERIPHERAL VASCULAR CATHETERIZATION Left 04/25/2015   Procedure:  Lower Extremity Angiography;  Surgeon: Annice Needy, MD;  Location: [facility];  Service: Cardiovascular;  Laterality: Left;   PERIPHERAL VASCULAR CATHETERIZATION Left 04/25/2015   Procedure: Lower Extremity Intervention;  Surgeon: Annice Needy, MD;  Location: [facility];  Service: Cardiovascular;  Laterality: Left;   PERIPHERAL VASCULAR CATHETERIZATION Left 01/02/2016   Procedure: Lower Extremity Angiography;  Surgeon: Annice Needy, MD;  Location: [facility];  Service: Cardiovascular;  Laterality: Left;   PERIPHERAL VASCULAR CATHETERIZATION  01/02/2016   Procedure: Lower Extremity Intervention;  Surgeon: Annice Needy, MD;  Location: [facility];  Service: Cardiovascular;;   THROMBECTOMY FEMORAL ARTERY Left 04/26/2015   Procedure: THROMBECTOMY FEMORAL ARTERY;  Surgeon: Annice Needy, MD;  Location: [facility] ORS;  Service: Vascular;  Laterality: Left;   Family History:  Family History  Problem Relation Age of Onset   Hypertension Mother    Hypertension Father     Social History:  Social History   Substance and Sexual Activity  Alcohol Use Yes   Alcohol/week: 0.0 standard drinks of alcohol   Comment: weekends, couple beers, pint of liquer only on weekends.  Last drank approx 1 month ago.     Social History   Substance and Sexual Activity  Drug Use No    Social History   Socioeconomic History   Marital status: Single    Spouse name: Not on file   Number of children: Not on file   Years of education: Not on file   Highest education level: Not on file  Occupational History   Not on file  Tobacco Use   Smoking status: Every Day    Current packs/day: 1.00    Average packs/day: 1 pack/day for 41.0 years (41.0 ttl pk-yrs)    Types: Cigarettes   Smokeless tobacco: Never   Tobacco comments:    Less than a 1/2 pack daily  Vaping Use   Vaping status: Never Used  Substance and Sexual Activity   Alcohol use: Yes    Alcohol/week: 0.0 standard drinks of  alcohol    Comment: weekends, couple beers, pint of liquer only on weekends.  Last drank approx 1 month ago.   Drug use: No   Sexual activity: Not Currently  Other Topics Concern   Not on file  Social History Narrative   Lives with family    Social Determinants of Health   Financial Resource Strain: Low Risk  (03/21/2023)   Received from [facility]   Overall Financial Resource Strain (CARDIA)    Difficulty of Paying Living Expenses: Not hard at all  Food Insecurity: No Food Insecurity (05/16/2023)   Hunger Vital Sign    Worried About Running Out of Food in the Last Year: Never true    Ran Out of Food in the Last Year: Never true  Transportation Needs: No Transportation Needs (05/16/2023)   PRAPARE - Administrator, Civil Service (Medical):  No    Lack of Transportation (Non-Medical): No  Physical Activity: Inactive (04/04/2018)   Exercise Vital Sign    Days of Exercise per Week: 0 days    Minutes of Exercise per Session: 0 min  Stress: No Stress Concern Present (04/04/2018)   [employer] of Occupational Health - Occupational Stress Questionnaire    Feeling of Stress : Only a little  Social Connections: Moderately Isolated (04/04/2018)   Social Connection and Isolation Panel [NHANES]    Frequency of Communication with Friends and Family: Once a week    Frequency of Social Gatherings with Friends and Family: Once a week    Attends Religious Services: 1 to 4 times per year    Active Member of [employer] or Organizations: No    Attends Banker Meetings: Never    Marital Status: Never married   Additional Social History:    Allergies:   Allergies  Allergen Reactions   Aspirin Nausea And Vomiting and Other (See Comments)    Pt states aspirin makes her cramp and have to use coated kind.   Tramadol    Zyrtec [Cetirizine]     Labs:  Results for orders placed or performed during the hospital encounter of 06/11/23 (from the past 48 hour(s))  Ethanol      Status: Abnormal   Collection Time: 06/11/23  8:25 PM  Result Value Ref Range   Alcohol, Ethyl (B) 402 (HH) <10 mg/dL    Comment: CRITICAL RESULT CALLED TO, READ BACK BY AND VERIFIED WITH AMY COYNE 06/11/23 2114 MU (NOTE) Lowest detectable limit for serum alcohol is 10 mg/dL.  For medical purposes only. Performed at [facility], [address] [place], [place] [address]   Comprehensive metabolic panel     Status: Abnormal   Collection Time: 06/11/23  8:26 PM  Result Value Ref Range   Sodium 140 135 - 145 mmol/L   Potassium 3.3 (L) 3.5 - 5.1 mmol/L   Chloride 103 98 - 111 mmol/L   CO2 22 22 - 32 mmol/L   Glucose, Bld 88 70 - 99 mg/dL    Comment: Glucose reference range applies only to samples taken after fasting for at least 8 hours.   BUN 16 8 - 23 mg/dL   Creatinine, Ser 0.98 0.44 - 1.00 mg/dL   Calcium 9.3 8.9 - 11.9 mg/dL   Total Protein 7.6 6.5 - 8.1 g/dL   Albumin 4.0 3.5 - 5.0 g/dL   AST 37 15 - 41 U/L   ALT 27 0 - 44 U/L   Alkaline Phosphatase 76 38 - 126 U/L   Total Bilirubin 0.5 0.3 - 1.2 mg/dL   GFR, Estimated >14 >78 mL/min    Comment: (NOTE) Calculated using the CKD-EPI Creatinine Equation (2021)    Anion gap 15 5 - 15    Comment: Performed at [facility], [address] [place], [place] [address]  Salicylate level     Status: Abnormal   Collection Time: 06/11/23  8:26 PM  Result Value Ref Range   Salicylate Lvl <7.0 (L) 7.0 - 30.0 mg/dL    Comment: Performed at [facility], [address] [place], [place] [address]  Acetaminophen level     Status: Abnormal   Collection Time: 06/11/23  8:26 PM  Result Value Ref Range   Acetaminophen (Tylenol), Serum <10 (L) 10 - 30 ug/mL    Comment: (NOTE) Therapeutic concentrations vary significantly. A range of 10-30 ug/mL  may be an effective concentration  for many patients. However, some  are best treated at concentrations outside of this range. Acetaminophen concentrations >150  ug/mL at 4 hours after ingestion  and >50 ug/mL at 12 hours after ingestion are often associated with  toxic reactions.  Performed at [facility], [address] [place], [place] [address]   cbc     Status: None   Collection Time: 06/11/23  8:26 PM  Result Value Ref Range   WBC 5.5 4.0 - 10.5 K/uL   RBC 4.03 3.87 - 5.11 MIL/uL   Hemoglobin 12.3 12.0 - 15.0 g/dL   HCT 01.6 01.0 - 93.2 %   MCV 92.3 80.0 - 100.0 fL   MCH 30.5 26.0 - 34.0 pg   MCHC 33.1 30.0 - 36.0 g/dL   RDW 35.5 73.2 - 20.2 %   Platelets 201 150 - 400 K/uL   nRBC 0.0 0.0 - 0.2 %    Comment: Performed at [facility], [address] [place], [place] [address]    No current facility-administered medications for this encounter.   Current Outpatient Medications  Medication Sig Dispense Refill   albuterol (VENTOLIN HFA) 108 (90 Base) MCG/ACT inhaler INHALE 2 PUFFS BY MOUTH EVERY 6 HOURS AS NEEDED FOR WHEEZING FOR SHORTNESS OF BREATH 9 g 0   aspirin EC 81 MG EC tablet Take 1 tablet (81 mg total) by mouth daily. 90 tablet 1   atorvastatin (LIPITOR) 80 MG tablet Take 1 tablet (80 mg total) by mouth daily. 90 tablet 3   budesonide-formoterol (SYMBICORT) 80-4.5 MCG/ACT inhaler INHALE 2 PUFFS BY MOUTH ONCE DAILY 22 g 2   cholecalciferol (VITAMIN D) 400 units TABS tablet Take 400 Units by mouth.     ezetimibe (ZETIA) 10 MG tablet Take 1 tablet (10 mg total) by mouth daily. 90 tablet 3   ferrous sulfate 325 (65 FE) MG tablet Take 325 mg by mouth daily.     losartan (COZAAR) 50 MG tablet Take 2 tablets (100 mg total) by mouth daily. 180 tablet 3   metoprolol succinate (TOPROL-XL) 50 MG 24 hr tablet TAKE 1 TABLET BY MOUTH ONCE DAILY WITH  OR  IMMEDIATELY  FOLLOWING  A  MEAL (Patient taking differently: Take 50 mg by mouth daily. WITH  OR  IMMEDIATELY  FOLLOWING  A  MEAL) 90 tablet 0   mirtazapine (REMERON) 15 MG tablet Take 1 tablet (15 mg total) by mouth at bedtime. 30 tablet 3   oxyCODONE (OXY  IR/ROXICODONE) 5 MG immediate release tablet Take 0.5 tablets (2.5 mg total) by mouth every 6 (six) hours as needed for moderate pain or severe pain. 30 tablet 0   oxyCODONE-acetaminophen (PERCOCET) 5-325 MG tablet Take 1 tablet by mouth every 6 (six) hours as needed for severe pain. 12 tablet 0   butalbital-acetaminophen-caffeine (FIORICET) 50-325-40 MG tablet Take 1 tablet by mouth every 6 (six) hours as needed for headache. (Patient not taking: Reported on 06/12/2023) 14 tablet 4             Psychiatric Specialty Exam:  Presentation  General Appearance:  Disheveled  Eye Contact: Fleeting  Speech: Garbled  Speech Volume: Decreased  Handedness: Right   Mood and Affect  Mood: Anxious; Depressed  Affect: Depressed; Restricted   Thought Process  Thought Processes: Linear  Descriptions of Associations:Intact  Orientation:Full (Time, Place and Person)  Thought Content:Logical  History of Schizophrenia/Schizoaffective disorder:No  Duration of Psychotic Symptoms:No data recorded Hallucinations:Hallucinations: None  Ideas of Reference:None  Suicidal Thoughts:Suicidal Thoughts: Yes, Passive  SI Active Intent and/or Plan: Without Intent; Without Plan  Homicidal Thoughts:Homicidal Thoughts: No   Sensorium  Memory: Immediate Fair; Recent Poor; Remote Fair  Judgment: Impaired  Insight: Lacking   Executive Functions  Concentration: Poor  Attention Span: Poor  Recall: Poor  Fund of Knowledge: Poor  Language: Poor   Psychomotor Activity  Psychomotor Activity: Psychomotor Activity: Normal   Assets  Assets: Desire for Improvement; Communication Skills; Resilience   Sleep  Sleep: Sleep: Poor   Physical Exam: Physical Exam Constitutional:      General: She is not in acute distress.    Appearance: She is not toxic-appearing.  Neurological:     Mental Status: She is alert.    Review of Systems  Psychiatric/Behavioral:   Positive for depression, memory loss, substance abuse and suicidal ideas. The patient is nervous/anxious and has insomnia.    Blood pressure 105/79, pulse 71, temperature 97.9 F (36.6 C), temperature source Oral, resp. rate 18, weight 48.5 kg, SpO2 96%. Body mass index is 18.35 kg/m.  Treatment Plan Summary:  Assessment: MDD severe recurrent without psychotic features Alcohol use disorder  Plan: Recommend inpatient psychiatric admission Patient already IVC  Hold medication treatment for depression for now, this can be evaluated and started once the patient is admitted to an inpatient psychiatric unit.  Recommend alcohol withdrawal management as per ED team   Disposition: Recommend psychiatric Inpatient admission when medically cleared.  Cristy Hilts, MD 06/12/2023 11:41 AM  Total Time Spent in Direct Patient Care:  I personally spent 35 minutes on the unit in direct patient care. The direct patient care time included face-to-face time with the patient, reviewing the patient's chart, communicating with other professionals, and coordinating care. Greater than 50% of this time was spent in counseling or coordinating care with the patient regarding goals of hospitalization, psycho-education, and discharge planning needs.   Phineas Inches, MD Psychiatrist

## 2023-06-12 NOTE — ED Notes (Signed)
IVC/Consult completed/ Recommend Inpatient Admit

## 2023-06-13 DIAGNOSIS — F332 Major depressive disorder, recurrent severe without psychotic features: Secondary | ICD-10-CM

## 2023-06-13 MED ORDER — EZETIMIBE 10 MG PO TABS
10.0000 mg | ORAL_TABLET | Freq: Every day | ORAL | Status: DC
  Administered 2023-06-13 – 2023-06-20 (×8): 10 mg via ORAL
  Filled 2023-06-13 (×9): qty 1

## 2023-06-13 MED ORDER — MIRTAZAPINE 15 MG PO TABS
15.0000 mg | ORAL_TABLET | Freq: Every day | ORAL | Status: DC
  Administered 2023-06-13 – 2023-06-19 (×7): 15 mg via ORAL
  Filled 2023-06-13 (×7): qty 1

## 2023-06-13 MED ORDER — LOSARTAN POTASSIUM 25 MG PO TABS
50.0000 mg | ORAL_TABLET | Freq: Every day | ORAL | Status: DC
  Administered 2023-06-13 – 2023-06-20 (×8): 50 mg via ORAL
  Filled 2023-06-13 (×8): qty 2

## 2023-06-13 MED ORDER — OLANZAPINE 5 MG PO TABS
5.0000 mg | ORAL_TABLET | Freq: Every day | ORAL | Status: DC
  Administered 2023-06-13 – 2023-06-16 (×4): 5 mg via ORAL
  Filled 2023-06-13 (×4): qty 1

## 2023-06-13 MED ORDER — OLANZAPINE 5 MG PO TABS
5.0000 mg | ORAL_TABLET | Freq: Four times a day (QID) | ORAL | Status: DC | PRN
  Administered 2023-06-18: 5 mg via ORAL
  Filled 2023-06-13: qty 1

## 2023-06-13 NOTE — Progress Notes (Signed)
   06/13/23 0601  15 Minute Checks  Location Bedroom  Visual Appearance Calm  Behavior Sleeping  Sleep (Behavioral Health Patients Only)  Calculate sleep? (Click Yes once per 24 hr at 0600 safety check) Yes  Documented sleep last 24 hours 9.75

## 2023-06-13 NOTE — Plan of Care (Signed)
  Problem: Clinical Measurements: Goal: Will remain free from infection Outcome: Progressing Goal: Respiratory complications will improve Outcome: Progressing Goal: Cardiovascular complication will be avoided Outcome: Progressing   Problem: Activity: Goal: Risk for activity intolerance will decrease Outcome: Progressing   Problem: Nutrition: Goal: Adequate nutrition will be maintained Outcome: Progressing   Problem: Coping: Goal: Level of anxiety will decrease Outcome: Progressing   Problem: Safety: Goal: Ability to remain free from injury will improve Outcome: Progressing   Problem: Skin Integrity: Goal: Risk for impaired skin integrity will decrease Outcome: Progressing

## 2023-06-13 NOTE — [facility] Suicide Risk Assessment (Signed)
[facility] Admission Suicide Risk Assessment   Nursing information obtained from:    Demographic factors:  Age 67 or older Current Mental Status:  NA Loss Factors:  NA Historical Factors:  NA Risk Reduction Factors:  NA  Total Time spent with patient: 1 hour Principal Problem: MDD (major depressive disorder) Diagnosis:  Principal Problem:   MDD (major depressive disorder)  Subjective Data: [name], 67 y.o., female patient seen via tele health by TTS and this provider; chart reviewed and consulted with Dr. Katrinka Blazing on 06/12/23.  Per chart review, triage note states, pt arrives by vehicle, dropped off to ED by "some white lady from an agency that picked me up". Pt endorses heavy ETOH use tonight, and states she is here because she is "torn between ending her life and not going through with it".    Patient reports that she is still depressed and sad. She is tearful.  Reports anhedonia.  Reports high level of anxiety.  Poor sleep, poor appetite and weight loss.  Demonstrates poor concentration.  Reports that she continues to have suicidal thoughts but no longer any plan or intent at this time.  Denies HI.  Reports that she still feels like she is withdrawing from alcohol.  She reports drinking for quite some time, is not specific about details of her alcohol use history including frequency, average amount, alcohol of choice.  She had very high BAL on admission.  She reports she is drinking daily, if possible, and whenever she can find to drink.  Continued Clinical Symptoms:  Alcohol Use Disorder Identification Test Final Score (AUDIT): 28 The "Alcohol Use Disorders Identification Test", Guidelines for Use in Primary Care, Second Edition.  World Science writer [facility]). Score between 0-7:  no or low risk or alcohol related problems. Score between 8-15:  moderate risk of alcohol related problems. Score between 16-19:  high risk of alcohol related problems. Score 20 or above:  warrants further  diagnostic evaluation for alcohol dependence and treatment.   CLINICAL FACTORS:   Depression:   Anhedonia Alcohol/Substance Abuse/Dependencies   Musculoskeletal: Strength & Muscle Tone: within normal limits Gait & Station: normal Patient leans: N/A  Psychiatric Specialty Exam:  Presentation  General Appearance:  Disheveled  Eye Contact: Fleeting  Speech: Garbled  Speech Volume: Decreased  Handedness: Right   Mood and Affect  Mood: Anxious; Depressed  Affect: Depressed; Restricted   Thought Process  Thought Processes: Linear  Descriptions of Associations:Intact  Orientation:Full (Time, Place and Person)  Thought Content:Logical  History of Schizophrenia/Schizoaffective disorder:No  Duration of Psychotic Symptoms:No data recorded Hallucinations:Hallucinations: None  Ideas of Reference:None  Suicidal Thoughts:Suicidal Thoughts: Yes, Passive SI Active Intent and/or Plan: Without Intent; Without Plan  Homicidal Thoughts:Homicidal Thoughts: No   Sensorium  Memory: Immediate Fair; Recent Poor; Remote Fair  Judgment: Impaired  Insight: Lacking   Executive Functions  Concentration: Poor  Attention Span: Poor  Recall: Poor  Fund of Knowledge: Poor  Language: Poor   Psychomotor Activity  Psychomotor Activity: Psychomotor Activity: Normal   Assets  Assets: Desire for Improvement; Communication Skills; Resilience   Sleep  Sleep: Sleep: Poor     Blood pressure (!) 155/81, pulse 91, temperature (!) 97.4 F (36.3 C), resp. rate (!) 21, height 5\' 4"  (1.626 m), weight 46.7 kg, SpO2 100%. Body mass index is 17.68 kg/m.   COGNITIVE FEATURES THAT CONTRIBUTE TO RISK:  None    SUICIDE RISK:   Minimal: No identifiable suicidal ideation.  Patients presenting with no risk factors but with  morbid ruminations; may be classified as minimal risk based on the severity of the depressive symptoms  PLAN OF CARE: See orders  I  certify that inpatient services furnished can reasonably be expected to improve the patient's condition.   Sarina Ill, DO 06/13/2023, 12:56 PM

## 2023-06-13 NOTE — H&P (Signed)
Psychiatric Admission Assessment Adult  Patient Identification: [name] MRN:  [id number] Date of Evaluation:  06/13/2023 Chief Complaint:  MDD (major depressive disorder) [F32.9] Principal Diagnosis: MDD (major depressive disorder) Diagnosis:  Principal Problem:   MDD (major depressive disorder)  History of Present Illness: [name] is a 67 year old African-American female who was involuntarily admitted to geriatric psychiatry for alcohol abuse and depression along with suicidal ideation.  She currently resides in [place] with her daughter, grandson, and sister.  She has a long history of depression and was hospitalized about 20 years ago at [facility] for suicidal ideation.  She states that she always stays depressed and that is why she drinks.  She currently does not have a psychiatrist and has not seen one in many years.  She did come in on Remeron at bedtime.  It is not clear as to whether she has been taking her medications.  She endorses anhedonia, difficulty sleeping, depressed mood and alcohol abuse.  Psychiatric evaluation in the emergency room is as follows: [name], 66 y.o., female patient seen via tele health by TTS and this provider; chart reviewed and consulted with Dr. Katrinka Blazing on 06/12/23.  Per chart review, triage note states, pt arrives by vehicle, dropped off to ED by "some white lady from an agency that picked me up". Pt endorses heavy ETOH use tonight, and states she is here because she is "torn between ending her life and not going through with it".    Patient reports that she is still depressed and sad. She is tearful.  Reports anhedonia.  Reports high level of anxiety.  Poor sleep, poor appetite and weight loss.  Demonstrates poor concentration.  Reports that she continues to have suicidal thoughts but no longer any plan or intent at this time.  Denies HI.  Reports that she still feels like she is withdrawing from alcohol.  She reports drinking for quite  some time, is not specific about details of her alcohol use history including frequency, average amount, alcohol of choice.  She had very high BAL on admission.  She reports she is drinking daily, if possible, and whenever she can find to drink.  Associated Signs/Symptoms: Depression Symptoms:  depressed mood, insomnia, anxiety, (Hypo) Manic Symptoms:   None Anxiety Symptoms:  Excessive Worry, Psychotic Symptoms:   None PTSD Symptoms: NA Total Time spent with patient: 1 hour  Past Psychiatric History: As above  Is the patient at risk to self? Yes.    Has the patient been a risk to self in the past 6 months? Yes.    Has the patient been a risk to self within the distant past? Yes.    Is the patient a risk to others? No.  Has the patient been a risk to others in the past 6 months? No.  Has the patient been a risk to others within the distant past? No.   [place] Scale:  Flowsheet Row Admission (Current) from 06/12/2023 in [facility] BEHAVIORAL MEDICINE ED from 06/11/2023 in [facility] Emergency Department at [facility] ED from 05/05/2023 in [facility] Emergency Department at [facility]  C-SSRS RISK CATEGORY Error: Q3, 4, or 5 should not be populated when Q2 is No High Risk No Risk        Prior Inpatient Therapy: Yes.   If yes, describe state hospital Prior Outpatient Therapy: No. If yes, describe   Alcohol Screening: 1. How often do you have a drink containing alcohol?: 4 or more times a week 2.  How many drinks containing alcohol do you have on a typical day when you are drinking?: 7, 8, or 9 3. How often do you have six or more drinks on one occasion?: Daily or almost daily AUDIT-C Score: 11 4. How often during the last year have you found that you were not able to stop drinking once you had started?: Weekly 5. How often during the last year have you failed to do what was normally expected from you because of drinking?: Weekly 6. How often during the last year  have you needed a first drink in the morning to get yourself going after a heavy drinking session?: Weekly 7. How often during the last year have you had a feeling of guilt of remorse after drinking?: Weekly 8. How often during the last year have you been unable to remember what happened the night before because you had been drinking?: Weekly 9. Have you or someone else been injured as a result of your drinking?: No 10. Has a relative or friend or a doctor or another health worker been concerned about your drinking or suggested you cut down?: Yes, but not in the last year Alcohol Use Disorder Identification Test Final Score (AUDIT): 28 Alcohol Brief Interventions/Follow-up: Alcohol education/Brief advice Substance Abuse History in the last 12 months:  Yes.   Consequences of Substance Abuse: NA Previous Psychotropic Medications: Yes  Psychological Evaluations: Yes  Past Medical History:  Past Medical History:  Diagnosis Date   Allergy    Environmental   Arthritis    Asthma    Carotid artery stenosis    Carotid stenosis    COPD (chronic obstructive pulmonary disease) (HCC)    Coronary artery disease    Coronary artery disease    Hemorrhoids    Hyperlipidemia    Hypertension    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(HCC)    negative cardiac cath   Nicotine addiction    Peripheral vascular disease (HCC)    Pneumonia    Shortness of breath dyspnea    Tobacco abuse     Past Surgical History:  Procedure Laterality Date   ABDOMINAL HYSTERECTOMY     APPENDECTOMY     CARDIAC CATHETERIZATION     CARDIAC SURGERY     CHOLECYSTECTOMY     COLONOSCOPY  12/15/11   OH->bleeding internal hemorrhoids, otherwise normal   COLONOSCOPY WITH PROPOFOL N/A 04/12/2015   Procedure: COLONOSCOPY WITH PROPOFOL;  Surgeon: Midge Minium, MD;  Location: [facility] ENDOSCOPY;  Service: Endoscopy;  Laterality: N/A;   COLONOSCOPY WITH PROPOFOL N/A 01/05/2020   Procedure: COLONOSCOPY WITH PROPOFOL;  Surgeon: Midge Minium, MD;  Location:  [facility] ENDOSCOPY;  Service: Endoscopy;  Laterality: N/A;   ENDARTERECTOMY FEMORAL Left 04/26/2015   Procedure: ENDARTERECTOMY FEMORAL;  Surgeon: Annice Needy, MD;  Location: [facility] ORS;  Service: Vascular;  Laterality: Left;   ENDOVASCULAR STENT INSERTION Bilateral    Legs   FLEXIBLE BRONCHOSCOPY N/A 02/02/2015   Procedure: FLEXIBLE BRONCHOSCOPY;  Surgeon: Yevonne Pax, MD;  Location: [facility] ORS;  Service: Pulmonary;  Laterality: N/A;   HEMORRHOID SURGERY     PERIPHERAL VASCULAR CATHETERIZATION Left 04/25/2015   Procedure: Lower Extremity Angiography;  Surgeon: Annice Needy, MD;  Location: [facility] INVASIVE CV LAB;  Service: Cardiovascular;  Laterality: Left;   PERIPHERAL VASCULAR CATHETERIZATION Left 04/25/2015   Procedure: Lower Extremity Intervention;  Surgeon: Annice Needy, MD;  Location: [facility] INVASIVE CV LAB;  Service: Cardiovascular;  Laterality: Left;   PERIPHERAL VASCULAR CATHETERIZATION Left 01/02/2016   Procedure: Lower Extremity  Angiography;  Surgeon: Annice Needy, MD;  Location: [facility] INVASIVE CV LAB;  Service: Cardiovascular;  Laterality: Left;   PERIPHERAL VASCULAR CATHETERIZATION  01/02/2016   Procedure: Lower Extremity Intervention;  Surgeon: Annice Needy, MD;  Location: [facility] INVASIVE CV LAB;  Service: Cardiovascular;;   THROMBECTOMY FEMORAL ARTERY Left 04/26/2015   Procedure: THROMBECTOMY FEMORAL ARTERY;  Surgeon: Annice Needy, MD;  Location: [facility] ORS;  Service: Vascular;  Laterality: Left;   Family History:  Family History  Problem Relation Age of Onset   Hypertension Mother    Hypertension Father    Family Psychiatric  History: Unremarkable Tobacco Screening:  Social History   Tobacco Use  Smoking Status Every Day   Current packs/day: 1.00   Average packs/day: 1 pack/day for 41.0 years (41.0 ttl pk-yrs)   Types: Cigarettes  Smokeless Tobacco Never  Tobacco Comments   Less than a 1/2 pack daily    BH Tobacco Counseling     Are you interested in Tobacco Cessation Medications?  Yes,  implement Nicotene Replacement Protocol Counseled patient on smoking cessation:  Refused/Declined practical counseling Reason Tobacco Screening Not Completed: No value filed.       Social History:  Social History   Substance and Sexual Activity  Alcohol Use Yes   Alcohol/week: 0.0 standard drinks of alcohol   Comment: weekends, couple beers, pint of liquer only on weekends.  Last drank approx 1 month ago.     Social History   Substance and Sexual Activity  Drug Use No    Additional Social History:                           Allergies:   Allergies  Allergen Reactions   Aspirin Nausea And Vomiting and Other (See Comments)    Pt states aspirin makes her cramp and have to use coated kind.   Tramadol    Zyrtec [Cetirizine]    Lab Results:  Results for orders placed or performed during the hospital encounter of 06/11/23 (from the past 48 hour(s))  Ethanol     Status: Abnormal   Collection Time: 06/11/23  8:25 PM  Result Value Ref Range   Alcohol, Ethyl (B) 402 (HH) <10 mg/dL    Comment: CRITICAL RESULT CALLED TO, READ BACK BY AND VERIFIED WITH AMY COYNE 06/11/23 2114 MU (NOTE) Lowest detectable limit for serum alcohol is 10 mg/dL.  For medical purposes only. Performed at [facility], [address] [place], [place] [address]   Comprehensive metabolic panel     Status: Abnormal   Collection Time: 06/11/23  8:26 PM  Result Value Ref Range   Sodium 140 135 - 145 mmol/L   Potassium 3.3 (L) 3.5 - 5.1 mmol/L   Chloride 103 98 - 111 mmol/L   CO2 22 22 - 32 mmol/L   Glucose, Bld 88 70 - 99 mg/dL    Comment: Glucose reference range applies only to samples taken after fasting for at least 8 hours.   BUN 16 8 - 23 mg/dL   Creatinine, Ser 8.11 0.44 - 1.00 mg/dL   Calcium 9.3 8.9 - 91.4 mg/dL   Total Protein 7.6 6.5 - 8.1 g/dL   Albumin 4.0 3.5 - 5.0 g/dL   AST 37 15 - 41 U/L   ALT 27 0 - 44 U/L   Alkaline Phosphatase 76 38 - 126 U/L   Total Bilirubin  0.5 0.3 - 1.2 mg/dL   GFR, Estimated >78 >29  mL/min    Comment: (NOTE) Calculated using the CKD-EPI Creatinine Equation (2021)    Anion gap 15 5 - 15    Comment: Performed at [facility], [address] [place], [place] [address]  Salicylate level     Status: Abnormal   Collection Time: 06/11/23  8:26 PM  Result Value Ref Range   Salicylate Lvl <7.0 (L) 7.0 - 30.0 mg/dL    Comment: Performed at [facility], [address] [place], [place] [address]  Acetaminophen level     Status: Abnormal   Collection Time: 06/11/23  8:26 PM  Result Value Ref Range   Acetaminophen (Tylenol), Serum <10 (L) 10 - 30 ug/mL    Comment: (NOTE) Therapeutic concentrations vary significantly. A range of 10-30 ug/mL  may be an effective concentration for many patients. However, some  are best treated at concentrations outside of this range. Acetaminophen concentrations >150 ug/mL at 4 hours after ingestion  and >50 ug/mL at 12 hours after ingestion are often associated with  toxic reactions.  Performed at [facility], [address] [place], [place] [address]   cbc     Status: None   Collection Time: 06/11/23  8:26 PM  Result Value Ref Range   WBC 5.5 4.0 - 10.5 K/uL   RBC 4.03 3.87 - 5.11 MIL/uL   Hemoglobin 12.3 12.0 - 15.0 g/dL   HCT 28.4 13.2 - 44.0 %   MCV 92.3 80.0 - 100.0 fL   MCH 30.5 26.0 - 34.0 pg   MCHC 33.1 30.0 - 36.0 g/dL   RDW 10.2 72.5 - 36.6 %   Platelets 201 150 - 400 K/uL   nRBC 0.0 0.0 - 0.2 %    Comment: Performed at [facility], [address], [place], [place] [address]    Blood Alcohol level:  Lab Results  Component Value Date   ETH 402 (HH) 06/11/2023   ETH 82 (H) 09/24/2021    Metabolic Disorder Labs:  Lab Results  Component Value Date   HGBA1C 5.8 (H) 08/05/2021   MPG 120 08/05/2021   No results found for: "PROLACTIN" Lab Results  Component Value Date   CHOL 149 05/28/2022   TRIG 76 05/28/2022   HDL 79  05/28/2022   CHOLHDL 1.9 05/28/2022   VLDL 9 08/06/2021   LDLCALC 55 05/28/2022   LDLCALC 8 08/06/2021    Current Medications: Current Facility-Administered Medications  Medication Dose Route Frequency Provider Last Rate Last Admin   acetaminophen (TYLENOL) tablet 650 mg  650 mg Oral Q6H PRN Lauree Chandler, NP   650 mg at 06/12/23 2031   albuterol (VENTOLIN HFA) 108 (90 Base) MCG/ACT inhaler 2 puff  2 puff Inhalation Q6H PRN Lauree Chandler, NP       alum & mag hydroxide-simeth (MAALOX/MYLANTA) 200-200-20 MG/5ML suspension 30 mL  30 mL Oral Q4H PRN Lauree Chandler, NP       diazepam (VALIUM) tablet 5 mg  5 mg Oral BID Sarina Ill, DO   5 mg at 06/13/23 0855   diphenhydrAMINE (BENADRYL) capsule 50 mg  50 mg Oral TID PRN Lauree Chandler, NP       Or   diphenhydrAMINE (BENADRYL) injection 50 mg  50 mg Intramuscular TID PRN Lauree Chandler, NP       folic acid (FOLVITE) tablet 1 mg  1 mg Oral Daily Sarina Ill, DO   1 mg at 06/13/23 0855   haloperidol (HALDOL) tablet 5  mg  5 mg Oral TID PRN Lauree Chandler, NP       Or   haloperidol lactate (HALDOL) injection 5 mg  5 mg Intramuscular TID PRN Lauree Chandler, NP       LORazepam (ATIVAN) tablet 2 mg  2 mg Oral TID PRN Lauree Chandler, NP       Or   LORazepam (ATIVAN) injection 2 mg  2 mg Intramuscular TID PRN Lauree Chandler, NP       LORazepam (ATIVAN) tablet 1-4 mg  1-4 mg Oral Q1H PRN Sarina Ill, DO       losartan (COZAAR) tablet 50 mg  50 mg Oral QPC breakfast Sarina Ill, DO   50 mg at 06/13/23 1105   magnesium hydroxide (MILK OF MAGNESIA) suspension 30 mL  30 mL Oral Daily PRN Lauree Chandler, NP       multivitamin with minerals tablet 1 tablet  1 tablet Oral Daily Sarina Ill, DO   1 tablet at 06/13/23 1610   nicotine (NICODERM CQ - dosed in mg/24 hours) patch 14 mg  14 mg Transdermal Daily Sarina Ill, DO       thiamine  (VITAMIN B1) tablet 100 mg  100 mg Oral Daily Sarina Ill, DO   100 mg at 06/13/23 9604   Or   thiamine (VITAMIN B1) injection 100 mg  100 mg Intravenous Daily Sarina Ill, DO       traZODone (DESYREL) tablet 50 mg  50 mg Oral QHS PRN Lauree Chandler, NP   50 mg at 06/12/23 2031   PTA Medications: Medications Prior to Admission  Medication Sig Dispense Refill Last Dose   albuterol (VENTOLIN HFA) 108 (90 Base) MCG/ACT inhaler INHALE 2 PUFFS BY MOUTH EVERY 6 HOURS AS NEEDED FOR WHEEZING FOR SHORTNESS OF BREATH 9 g 0    aspirin EC 81 MG EC tablet Take 1 tablet (81 mg total) by mouth daily. 90 tablet 1    atorvastatin (LIPITOR) 80 MG tablet Take 1 tablet (80 mg total) by mouth daily. 90 tablet 3    budesonide-formoterol (SYMBICORT) 80-4.5 MCG/ACT inhaler INHALE 2 PUFFS BY MOUTH ONCE DAILY 22 g 2    butalbital-acetaminophen-caffeine (FIORICET) 50-325-40 MG tablet Take 1 tablet by mouth every 6 (six) hours as needed for headache. (Patient not taking: Reported on 06/12/2023) 14 tablet 4    cholecalciferol (VITAMIN D) 400 units TABS tablet Take 400 Units by mouth.      ezetimibe (ZETIA) 10 MG tablet Take 1 tablet (10 mg total) by mouth daily. 90 tablet 3    ferrous sulfate 325 (65 FE) MG tablet Take 325 mg by mouth daily.      losartan (COZAAR) 50 MG tablet Take 2 tablets (100 mg total) by mouth daily. 180 tablet 3    metoprolol succinate (TOPROL-XL) 50 MG 24 hr tablet TAKE 1 TABLET BY MOUTH ONCE DAILY WITH  OR  IMMEDIATELY  FOLLOWING  A  MEAL (Patient taking differently: Take 50 mg by mouth daily. WITH  OR  IMMEDIATELY  FOLLOWING  A  MEAL) 90 tablet 0    mirtazapine (REMERON) 15 MG tablet Take 1 tablet (15 mg total) by mouth at bedtime. 30 tablet 3    oxyCODONE (OXY IR/ROXICODONE) 5 MG immediate release tablet Take 0.5 tablets (2.5 mg total) by mouth every 6 (six) hours as needed for moderate pain or severe pain. 30 tablet 0    oxyCODONE-acetaminophen (PERCOCET) 5-325 MG  tablet Take 1 tablet by mouth every 6 (six) hours as needed for severe pain. 12 tablet 0     Musculoskeletal: Strength & Muscle Tone: within normal limits Gait & Station: normal Patient leans: N/A            Psychiatric Specialty Exam:  Presentation  General Appearance:  Disheveled  Eye Contact: Fleeting  Speech: Garbled  Speech Volume: Decreased  Handedness: Right   Mood and Affect  Mood: Anxious; Depressed  Affect: Depressed; Restricted   Thought Process  Thought Processes: Linear  Duration of Psychotic Symptoms:N/A Past Diagnosis of Schizophrenia or Psychoactive disorder: No  Descriptions of Associations:Intact  Orientation:Full (Time, Place and Person)  Thought Content:Logical  Hallucinations:Hallucinations: None  Ideas of Reference:None  Suicidal Thoughts:Suicidal Thoughts: Yes, Passive SI Active Intent and/or Plan: Without Intent; Without Plan  Homicidal Thoughts:Homicidal Thoughts: No   Sensorium  Memory: Immediate Fair; Recent Poor; Remote Fair  Judgment: Impaired  Insight: Lacking   Executive Functions  Concentration: Poor  Attention Span: Poor  Recall: Poor  Fund of Knowledge: Poor  Language: Poor   Psychomotor Activity  Psychomotor Activity: Psychomotor Activity: Normal   Assets  Assets: Desire for Improvement; Communication Skills; Resilience   Sleep  Sleep: Sleep: Poor    Physical Exam: Physical Exam Constitutional:      Appearance: Normal appearance.  HENT:     Head: Normocephalic and atraumatic.     Mouth/Throat:     Pharynx: Oropharynx is clear.  Eyes:     Pupils: Pupils are equal, round, and reactive to light.  Cardiovascular:     Rate and Rhythm: Normal rate and regular rhythm.  Pulmonary:     Effort: Pulmonary effort is normal.     Breath sounds: Normal breath sounds.  Abdominal:     General: Abdomen is flat.     Palpations: Abdomen is soft.  Musculoskeletal:         General: Normal range of motion.  Skin:    General: Skin is warm and dry.  Neurological:     General: No focal deficit present.     Mental Status: She is alert. Mental status is at baseline.  Psychiatric:        Attention and Perception: Attention and perception normal.        Mood and Affect: Mood is anxious and depressed. Affect is flat.        Speech: Speech normal.        Behavior: Behavior is cooperative.        Thought Content: Thought content normal.        Cognition and Memory: Cognition and memory normal.        Judgment: Judgment normal.    Review of Systems  Constitutional: Negative.   HENT: Negative.    Eyes: Negative.   Respiratory: Negative.    Cardiovascular: Negative.   Gastrointestinal: Negative.   Genitourinary: Negative.   Musculoskeletal: Negative.   Skin: Negative.   Neurological: Negative.   Endo/Heme/Allergies: Negative.   Psychiatric/Behavioral:  Positive for depression and substance abuse. The patient has insomnia.    Blood pressure (!) 155/81, pulse 91, temperature (!) 97.4 F (36.3 C), resp. rate (!) 21, height 5\' 4"  (1.626 m), weight 46.7 kg, SpO2 100%. Body mass index is 17.68 kg/m.  Treatment Plan Summary: Daily contact with patient to assess and evaluate symptoms and progress in treatment, Medication management, and Plan start detox protocol.  Restart her Remeron at bedtime.  Observation Level/Precautions:  15 minute checks  Laboratory:  CBC Chemistry Profile  Psychotherapy:    Medications:    Consultations:    Discharge Concerns:    Estimated LOS:  Other:     Physician Treatment Plan for Primary Diagnosis: MDD (major depressive disorder) Long Term Goal(s): Improvement in symptoms so as ready for discharge  Short Term Goals: Ability to identify changes in lifestyle to reduce recurrence of condition will improve, Ability to verbalize feelings will improve, Ability to disclose and discuss suicidal ideas, Ability to demonstrate  self-control will improve, Ability to identify and develop effective coping behaviors will improve, Ability to maintain clinical measurements within normal limits will improve, Compliance with prescribed medications will improve, and Ability to identify triggers associated with substance abuse/mental health issues will improve  Physician Treatment Plan for Secondary Diagnosis: Principal Problem:   MDD (major depressive disorder)   I certify that inpatient services furnished can reasonably be expected to improve the patient's condition.    Blessed Girdner Tresea Mall, DO 10/10/202412:57 PM

## 2023-06-13 NOTE — Group Note (Signed)
Recreation Therapy Group Note   Group Topic:Leisure Education  Group Date: 06/13/2023 Start Time: 1400 End Time: 1500 Facilitators: Rosina Lowenstein, LRT, CTRS Location:  Day Room  Group Description: Bingo. LRT and patients played multiple games of Bingo with music playing in the background. LRT and pts discussed how this could be a leisure interest and the importance of doing things they enjoy post-discharge.   Goal Area(s) Addressed: Patient will identify leisure interests.  Patient will practice healthy decision making. Patient will engage in recreation activity.    Affect/Mood: Appropriate and Flat   Participation Level: Active and Engaged   Participation Quality: Independent   Behavior: Calm and Cooperative   Speech/Thought Process: Coherent and Directed   Insight: Fair   Judgement: Fair    Modes of Intervention: Cooperative Play   Patient Response to Interventions:  Attentive and Receptive   Education Outcome:  Acknowledges education   Clinical Observations/Individualized Feedback: [name] was active in their participation of session activities and group discussion. Pt played games of bingo, however chose to sit at the table by herself away from LRT and peers, despite encouragement to join the larger table with peers.   Plan: Continue to engage patient in RT group sessions 2-3x/week.   Rosina Lowenstein, LRT, CTRS 06/13/2023 3:11 PM

## 2023-06-13 NOTE — Plan of Care (Signed)
  Problem: Coping: Goal: Level of anxiety will decrease Outcome: Progressing   Problem: Elimination: Goal: Will not experience complications related to bowel motility Outcome: Progressing Goal: Will not experience complications related to urinary retention Outcome: Progressing   

## 2023-06-13 NOTE — Progress Notes (Signed)
   06/13/23 1200  Psych Admission Type (Psych Patients Only)  Admission Status Involuntary  Psychosocial Assessment  Patient Complaints Depression  Eye Contact Fair  Facial Expression Flat  Affect Flat  Speech Soft  Interaction Assertive  Motor Activity Slow  Appearance/Hygiene In scrubs  Behavior Characteristics Cooperative  Mood Depressed  Thought Process  Coherency WDL  Content WDL  Delusions None reported or observed  Perception WDL  Hallucination None reported or observed  Judgment WDL  Confusion None  Danger to Self  Current suicidal ideation? Denies  Agreement Not to Harm Self Yes  Description of Agreement verbal  Danger to Others  Danger to Others None reported or observed

## 2023-06-14 DIAGNOSIS — F332 Major depressive disorder, recurrent severe without psychotic features: Secondary | ICD-10-CM | POA: Diagnosis not present

## 2023-06-14 MED ORDER — DIAZEPAM 2 MG PO TABS
2.0000 mg | ORAL_TABLET | Freq: Two times a day (BID) | ORAL | Status: DC
  Administered 2023-06-14 – 2023-06-16 (×4): 2 mg via ORAL
  Filled 2023-06-14 (×4): qty 1

## 2023-06-14 MED ORDER — OXYCODONE HCL 5 MG PO TABS
10.0000 mg | ORAL_TABLET | Freq: Four times a day (QID) | ORAL | Status: DC | PRN
  Administered 2023-06-16 – 2023-06-19 (×5): 10 mg via ORAL
  Filled 2023-06-14 (×5): qty 2

## 2023-06-14 MED ORDER — GABAPENTIN 100 MG PO CAPS
100.0000 mg | ORAL_CAPSULE | Freq: Three times a day (TID) | ORAL | Status: DC
  Administered 2023-06-14 – 2023-06-20 (×19): 100 mg via ORAL
  Filled 2023-06-14 (×19): qty 1

## 2023-06-14 NOTE — Plan of Care (Signed)
D: Pt alert and oriented. Pt denies experiencing any anxiety/depression at this time. Pt reports experiencing 8/10 right side abdominal pain at this time. Pt denies experiencing any SI/HI, or AVH at this time.   A: Scheduled medications administered to pt, per MD orders. Support and encouragement provided. Frequent verbal contact made. Routine safety checks conducted q15 minutes.   R: No adverse drug reactions noted. Pt verbally contracts for safety at this time. Pt compliant with medications and treatment plan. Pt interacts well with others on the unit. Pt remains safe at this time. Plan of care ongoing.  Problem: Coping: Goal: Level of anxiety will decrease Outcome: Progressing   Problem: Pain Managment: Goal: General experience of comfort will improve Outcome: Not Progressing

## 2023-06-14 NOTE — Plan of Care (Signed)
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
## 2023-06-14 NOTE — Group Note (Signed)
Recreation Therapy Group Note   Group Topic:Other  Group Date: 06/14/2023 Start Time: 1400 End Time: 1450 Facilitators: Rosina Lowenstein, LRT, CTRS Location: Courtyard  Group Description: Outdoor Recreation. Patients had the option to play corn hole, ring toss, bowling or listening to music while outside in the courtyard getting fresh air and sunlight. Popular dance hit songs, like the Cha Cha Slide, were then played and patients were encouraged to join in. LRT and patients discussed things that they enjoy doing in their free time outside of the hospital. LRT and techs encouraged patients to drink water after being active and getting their heart rate up.   Goal Area(s) Addressed: Patient will identify leisure interests.  Patient will practice healthy decision making. Patient will engage in recreation activity.   Affect/Mood: N/A   Participation Level: Did not attend    Clinical Observations/Individualized Feedback: Jacqualyn did not attend group.  Plan: Continue to engage patient in RT group sessions 2-3x/week.   Rosina Lowenstein, LRT, CTRS 06/14/2023 3:16 PM

## 2023-06-14 NOTE — Group Note (Signed)
Date:  06/14/2023 Time:  9:25 PM  Group Topic/Focus:  Making Healthy Choices:   The focus of this group is to help patients identify negative/unhealthy choices they were using prior to admission and identify positive/healthier coping strategies to replace them upon discharge.    Participation Level:  Active  Participation Quality:  Appropriate  Affect:  Appropriate  Cognitive:  Appropriate  Insight: Appropriate  Engagement in Group:  Engaged  Modes of Intervention:  Education  Additional Comments:    Garry Heater 06/14/2023, 9:25 PM

## 2023-06-14 NOTE — Plan of Care (Signed)
New goal as of 06/14/23  Problem: Depression Goal: STG - Patient will identify 3 positive coping skills to decrease depressive symptoms within 5 recreation therapy group sessions Description: STG - Patient will identify 3 positive coping skills to decrease depressive symptoms within 5 recreation therapy group sessions Outcome: Not Applicable

## 2023-06-14 NOTE — BH IP Treatment Plan (Signed)
Interdisciplinary Treatment and Diagnostic Plan Update  06/14/2023 Time of Session: 10:23 AM  [name] MRN: [id number]  Principal Diagnosis: MDD (major depressive disorder)  Secondary Diagnoses: Principal Problem:   MDD (major depressive disorder)   Current Medications:  Current Facility-Administered Medications  Medication Dose Route Frequency Provider Last Rate Last Admin   acetaminophen (TYLENOL) tablet 650 mg  650 mg Oral Q6H PRN Lauree Chandler, NP   650 mg at 06/13/23 1831   albuterol (VENTOLIN HFA) 108 (90 Base) MCG/ACT inhaler 2 puff  2 puff Inhalation Q6H PRN Lauree Chandler, NP       alum & mag hydroxide-simeth (MAALOX/MYLANTA) 200-200-20 MG/5ML suspension 30 mL  30 mL Oral Q4H PRN Lauree Chandler, NP       diazepam (VALIUM) tablet 2 mg  2 mg Oral BID Sarina Ill, DO       ezetimibe (ZETIA) tablet 10 mg  10 mg Oral Daily Sarina Ill, DO   10 mg at 06/14/23 0900   folic acid (FOLVITE) tablet 1 mg  1 mg Oral Daily Sarina Ill, DO   1 mg at 06/14/23 0900   gabapentin (NEURONTIN) capsule 100 mg  100 mg Oral TID Sarina Ill, DO       losartan (COZAAR) tablet 50 mg  50 mg Oral QPC breakfast Sarina Ill, DO   50 mg at 06/14/23 0859   magnesium hydroxide (MILK OF MAGNESIA) suspension 30 mL  30 mL Oral Daily PRN Lauree Chandler, NP       mirtazapine (REMERON) tablet 15 mg  15 mg Oral QHS Sarina Ill, DO   15 mg at 06/13/23 2130   multivitamin with minerals tablet 1 tablet  1 tablet Oral Daily Sarina Ill, DO   1 tablet at 06/14/23 0900   nicotine (NICODERM CQ - dosed in mg/24 hours) patch 14 mg  14 mg Transdermal Daily Sarina Ill, DO   14 mg at 06/14/23 0856   OLANZapine (ZYPREXA) tablet 5 mg  5 mg Oral Q6H PRN Sarina Ill, DO       OLANZapine [facility]) tablet 5 mg  5 mg Oral QHS Sarina Ill, DO   5 mg at 06/13/23 2131   oxyCODONE (Oxy  IR/ROXICODONE) immediate release tablet 10 mg  10 mg Oral Q6H PRN Sarina Ill, DO       thiamine (VITAMIN B1) tablet 100 mg  100 mg Oral Daily Sarina Ill, DO   100 mg at 06/14/23 0900   traZODone (DESYREL) tablet 50 mg  50 mg Oral QHS PRN Lauree Chandler, NP   50 mg at 06/12/23 2031   PTA Medications: Medications Prior to Admission  Medication Sig Dispense Refill Last Dose   albuterol (VENTOLIN HFA) 108 (90 Base) MCG/ACT inhaler INHALE 2 PUFFS BY MOUTH EVERY 6 HOURS AS NEEDED FOR WHEEZING FOR SHORTNESS OF BREATH 9 g 0    aspirin EC 81 MG EC tablet Take 1 tablet (81 mg total) by mouth daily. 90 tablet 1    atorvastatin (LIPITOR) 80 MG tablet Take 1 tablet (80 mg total) by mouth daily. 90 tablet 3    budesonide-formoterol (SYMBICORT) 80-4.5 MCG/ACT inhaler INHALE 2 PUFFS BY MOUTH ONCE DAILY 22 g 2    butalbital-acetaminophen-caffeine (FIORICET) 50-325-40 MG tablet Take 1 tablet by mouth every 6 (six) hours as needed for headache. (Patient not taking: Reported on 06/12/2023) 14 tablet 4    cholecalciferol (VITAMIN D) 400 units TABS  tablet Take 400 Units by mouth.      ezetimibe (ZETIA) 10 MG tablet Take 1 tablet (10 mg total) by mouth daily. 90 tablet 3    ferrous sulfate 325 (65 FE) MG tablet Take 325 mg by mouth daily.      losartan (COZAAR) 50 MG tablet Take 2 tablets (100 mg total) by mouth daily. 180 tablet 3    metoprolol succinate (TOPROL-XL) 50 MG 24 hr tablet TAKE 1 TABLET BY MOUTH ONCE DAILY WITH  OR  IMMEDIATELY  FOLLOWING  A  MEAL (Patient taking differently: Take 50 mg by mouth daily. WITH  OR  IMMEDIATELY  FOLLOWING  A  MEAL) 90 tablet 0    mirtazapine (REMERON) 15 MG tablet Take 1 tablet (15 mg total) by mouth at bedtime. 30 tablet 3    oxyCODONE (OXY IR/ROXICODONE) 5 MG immediate release tablet Take 0.5 tablets (2.5 mg total) by mouth every 6 (six) hours as needed for moderate pain or severe pain. 30 tablet 0    oxyCODONE-acetaminophen (PERCOCET) 5-325 MG  tablet Take 1 tablet by mouth every 6 (six) hours as needed for severe pain. 12 tablet 0     Patient Stressors:    Patient Strengths:    Treatment Modalities: Medication Management, Group therapy, Case management,  1 to 1 session with clinician, Psychoeducation, Recreational therapy.   Physician Treatment Plan for Primary Diagnosis: MDD (major depressive disorder) Long Term Goal(s): Improvement in symptoms so as ready for discharge   Short Term Goals: Ability to identify changes in lifestyle to reduce recurrence of condition will improve Ability to verbalize feelings will improve Ability to disclose and discuss suicidal ideas Ability to demonstrate self-control will improve Ability to identify and develop effective coping behaviors will improve Ability to maintain clinical measurements within normal limits will improve Compliance with prescribed medications will improve Ability to identify triggers associated with substance abuse/mental health issues will improve  Medication Management: Evaluate patient's response, side effects, and tolerance of medication regimen.  Therapeutic Interventions: 1 to 1 sessions, Unit Group sessions and Medication administration.  Evaluation of Outcomes: Progressing  Physician Treatment Plan for Secondary Diagnosis: Principal Problem:   MDD (major depressive disorder)  Long Term Goal(s): Improvement in symptoms so as ready for discharge   Short Term Goals: Ability to identify changes in lifestyle to reduce recurrence of condition will improve Ability to verbalize feelings will improve Ability to disclose and discuss suicidal ideas Ability to demonstrate self-control will improve Ability to identify and develop effective coping behaviors will improve Ability to maintain clinical measurements within normal limits will improve Compliance with prescribed medications will improve Ability to identify triggers associated with substance abuse/mental  health issues will improve     Medication Management: Evaluate patient's response, side effects, and tolerance of medication regimen.  Therapeutic Interventions: 1 to 1 sessions, Unit Group sessions and Medication administration.  Evaluation of Outcomes: Progressing   RN Treatment Plan for Primary Diagnosis: MDD (major depressive disorder) Long Term Goal(s): Knowledge of disease and therapeutic regimen to maintain health will improve  Short Term Goals: Ability to remain free from injury will improve, Ability to verbalize frustration and anger appropriately will improve, Ability to demonstrate self-control, Ability to participate in decision making will improve, Ability to verbalize feelings will improve, Ability to disclose and discuss suicidal ideas, Ability to identify and develop effective coping behaviors will improve, and Compliance with prescribed medications will improve  Medication Management: RN will administer medications as ordered by provider, will assess and  evaluate patient's response and provide education to patient for prescribed medication. RN will report any adverse and/or side effects to prescribing provider.  Therapeutic Interventions: 1 on 1 counseling sessions, Psychoeducation, Medication administration, Evaluate responses to treatment, Monitor vital signs and CBGs as ordered, Perform/monitor CIWA, COWS, AIMS and Fall Risk screenings as ordered, Perform wound care treatments as ordered.  Evaluation of Outcomes: Progressing   LCSW Treatment Plan for Primary Diagnosis: MDD (major depressive disorder) Long Term Goal(s): Safe transition to appropriate next level of care at discharge, Engage patient in therapeutic group addressing interpersonal concerns.  Short Term Goals: Engage patient in aftercare planning with referrals and resources, Increase social support, Increase ability to appropriately verbalize feelings, Increase emotional regulation, Facilitate acceptance of  mental health diagnosis and concerns, Facilitate patient progression through stages of change regarding substance use diagnoses and concerns, Identify triggers associated with mental health/substance abuse issues, and Increase skills for wellness and recovery  Therapeutic Interventions: Assess for all discharge needs, 1 to 1 time with Social worker, Explore available resources and support systems, Assess for adequacy in community support network, Educate family and significant other(s) on suicide prevention, Complete Psychosocial Assessment, Interpersonal group therapy.  Evaluation of Outcomes: Progressing   Progress in Treatment: Attending groups: Yes. Participating in groups: Yes. Taking medication as prescribed: Yes. Toleration medication: Yes. Family/Significant other contact made: No, will contact:  CSW will contact if given permission  Patient understands diagnosis: Yes. Discussing patient identified problems/goals with staff: Yes. Medical problems stabilized or resolved: Yes. Denies suicidal/homicidal ideation: Yes. Issues/concerns per patient self-inventory: No. Other: None   New problem(s) identified: No, Describe:  None identified   New Short Term/Long Term Goal(s): detox, elimination of symptoms of psychosis, medication management for mood stabilization; elimination of SI thoughts; development of comprehensive mental wellness/sobriety plan.   Patient Goals:  "Get my nerves together so I can go home and get out"  Discharge Plan or Barriers: CSW will assist with appropriate discharge planning   Reason for Continuation of Hospitalization: Depression Medication stabilization  Estimated Length of Stay: 1 to 7 days   Last 3 [place] Suicide Severity Risk Score: Flowsheet Row Admission (Current) from 06/12/2023 in [facility] ED from 06/11/2023 in [facility] Department at [facility] ED from 05/05/2023 in [facility] Emergency Department at  [facility]  C-SSRS RISK CATEGORY Error: Q3, 4, or 5 should not be populated when Q2 is No High Risk No Risk       Last PHQ 2/9 Scores:    05/31/2023   10:26 AM 05/16/2023   10:12 AM 05/25/2022   10:21 AM  Depression screen PHQ 2/9  Decreased Interest 1 3 3   Down, Depressed, Hopeless 0 3 3  PHQ - 2 Score 1 6 6   Altered sleeping  1 3  Tired, decreased energy  1 0  Change in appetite  1 3  Feeling bad or failure about yourself   1 3  Trouble concentrating  1 0  Moving slowly or fidgety/restless  0 0  Suicidal thoughts  0 0  PHQ-9 Score  11 15  Difficult doing work/chores   Not difficult at all    Scribe for Treatment Team: Elza Rafter, LCSWA 06/14/2023 11:30 AM

## 2023-06-14 NOTE — Progress Notes (Signed)
D- Patient alert and oriented. Flat and sad affect.  C/o abd pain.  Denies anxiety and depression. Denies SI, HI, AVH. A- Scheduled medications administered to patient, per MD orders. Support and encouragement provided.  Routine safety checks conducted every 15 minutes.  Patient informed to notify staff with problems or concerns. R- No adverse drug reactions noted. Patient contracts for safety at this time. Patient compliant with medications and treatment plan. CIWA maint in progress. CIWA score 0.  No s/s w/d noted. Limited interaction with peers and staff noted. Patient remains safe at this time.

## 2023-06-14 NOTE — Progress Notes (Signed)
[facility] MD Progress Note  06/14/2023 11:23 AM Jesse Hirst  MRN:  [id number] Subjective: [name] is seen on rounds.  I started her on Remeron at bedtime.  She says that she is doing okay but she has a lot of pain.  She has recently been treated for cancer and was on oxycodone.  I talked to her about Neurontin and told her that I would order the oxycodone as needed.  I reminded her to ask for it. Principal Problem: MDD (major depressive disorder) Diagnosis: Principal Problem:   MDD (major depressive disorder)  Total Time spent with patient: 15 minutes  Past Psychiatric History: Depression and alcohol abuse  Past Medical History:  Past Medical History:  Diagnosis Date   Allergy    Environmental   Arthritis    Asthma    Carotid artery stenosis    Carotid stenosis    COPD (chronic obstructive pulmonary disease) (HCC)    Coronary artery disease    Coronary artery disease    Hemorrhoids    Hyperlipidemia    Hypertension    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(HCC)    negative cardiac cath   Nicotine addiction    Peripheral vascular disease (HCC)    Pneumonia    Shortness of breath dyspnea    Tobacco abuse     Past Surgical History:  Procedure Laterality Date   ABDOMINAL HYSTERECTOMY     APPENDECTOMY     CARDIAC CATHETERIZATION     CARDIAC SURGERY     CHOLECYSTECTOMY     COLONOSCOPY  12/15/11   OH->bleeding internal hemorrhoids, otherwise normal   COLONOSCOPY WITH PROPOFOL N/A 04/12/2015   Procedure: COLONOSCOPY WITH PROPOFOL;  Surgeon: Midge Minium, MD;  Location: [facility] ENDOSCOPY;  Service: Endoscopy;  Laterality: N/A;   COLONOSCOPY WITH PROPOFOL N/A 01/05/2020   Procedure: COLONOSCOPY WITH PROPOFOL;  Surgeon: Midge Minium, MD;  Location: [facility] ENDOSCOPY;  Service: Endoscopy;  Laterality: N/A;   ENDARTERECTOMY FEMORAL Left 04/26/2015   Procedure: ENDARTERECTOMY FEMORAL;  Surgeon: Annice Needy, MD;  Location: [facility] ORS;  Service: Vascular;  Laterality: Left;   ENDOVASCULAR STENT INSERTION Bilateral     Legs   FLEXIBLE BRONCHOSCOPY N/A 02/02/2015   Procedure: FLEXIBLE BRONCHOSCOPY;  Surgeon: Yevonne Pax, MD;  Location: [facility] ORS;  Service: Pulmonary;  Laterality: N/A;   HEMORRHOID SURGERY     PERIPHERAL VASCULAR CATHETERIZATION Left 04/25/2015   Procedure: Lower Extremity Angiography;  Surgeon: Annice Needy, MD;  Location: [facility] INVASIVE CV LAB;  Service: Cardiovascular;  Laterality: Left;   PERIPHERAL VASCULAR CATHETERIZATION Left 04/25/2015   Procedure: Lower Extremity Intervention;  Surgeon: Annice Needy, MD;  Location: [facility] INVASIVE CV LAB;  Service: Cardiovascular;  Laterality: Left;   PERIPHERAL VASCULAR CATHETERIZATION Left 01/02/2016   Procedure: Lower Extremity Angiography;  Surgeon: Annice Needy, MD;  Location: [facility] INVASIVE CV LAB;  Service: Cardiovascular;  Laterality: Left;   PERIPHERAL VASCULAR CATHETERIZATION  01/02/2016   Procedure: Lower Extremity Intervention;  Surgeon: Annice Needy, MD;  Location: [facility] INVASIVE CV LAB;  Service: Cardiovascular;;   THROMBECTOMY FEMORAL ARTERY Left 04/26/2015   Procedure: THROMBECTOMY FEMORAL ARTERY;  Surgeon: Annice Needy, MD;  Location: [facility] ORS;  Service: Vascular;  Laterality: Left;   Family History:  Family History  Problem Relation Age of Onset   Hypertension Mother    Hypertension Father    Family Psychiatric  History: Unremarkable Social History:  Social History   Substance and Sexual Activity  Alcohol Use Yes   Alcohol/week: 0.0 standard drinks of  alcohol   Comment: weekends, couple beers, pint of liquer only on weekends.  Last drank approx 1 month ago.     Social History   Substance and Sexual Activity  Drug Use No    Social History   Socioeconomic History   Marital status: Single    Spouse name: Not on file   Number of children: Not on file   Years of education: Not on file   Highest education level: Not on file  Occupational History   Not on file  Tobacco Use   Smoking status: Every Day    Current packs/day: 1.00     Average packs/day: 1 pack/day for 41.0 years (41.0 ttl pk-yrs)    Types: Cigarettes   Smokeless tobacco: Never   Tobacco comments:    Less than a 1/2 pack daily  Vaping Use   Vaping status: Never Used  Substance and Sexual Activity   Alcohol use: Yes    Alcohol/week: 0.0 standard drinks of alcohol    Comment: weekends, couple beers, pint of liquer only on weekends.  Last drank approx 1 month ago.   Drug use: No   Sexual activity: Not Currently  Other Topics Concern   Not on file  Social History Narrative   Lives with family    Social Determinants of Health   Financial Resource Strain: Low Risk  (03/21/2023)   Received from [facility]   Overall Financial Resource Strain (CARDIA)    Difficulty of Paying Living Expenses: Not hard at all  Food Insecurity: No Food Insecurity (06/12/2023)   Hunger Vital Sign    Worried About Running Out of Food in the Last Year: Never true    Ran Out of Food in the Last Year: Never true  Transportation Needs: No Transportation Needs (06/12/2023)   PRAPARE - Administrator, Civil Service (Medical): No    Lack of Transportation (Non-Medical): No  Physical Activity: Inactive (04/04/2018)   Exercise Vital Sign    Days of Exercise per Week: 0 days    Minutes of Exercise per Session: 0 min  Stress: No Stress Concern Present (04/04/2018)   [employer] of Occupational Health - Occupational Stress Questionnaire    Feeling of Stress : Only a little  Social Connections: Moderately Isolated (04/04/2018)   Social Connection and Isolation Panel [NHANES]    Frequency of Communication with Friends and Family: Once a week    Frequency of Social Gatherings with Friends and Family: Once a week    Attends Religious Services: 1 to 4 times per year    Active Member of [employer] or Organizations: No    Attends Banker Meetings: Never    Marital Status: Never married   Additional Social History:                         Sleep:  Good  Appetite:  Good  Current Medications: Current Facility-Administered Medications  Medication Dose Route Frequency Provider Last Rate Last Admin   acetaminophen (TYLENOL) tablet 650 mg  650 mg Oral Q6H PRN Lauree Chandler, NP   650 mg at 06/13/23 1831   albuterol (VENTOLIN HFA) 108 (90 Base) MCG/ACT inhaler 2 puff  2 puff Inhalation Q6H PRN Lauree Chandler, NP       alum & mag hydroxide-simeth (MAALOX/MYLANTA) 200-200-20 MG/5ML suspension 30 mL  30 mL Oral Q4H PRN Lauree Chandler, NP       diazepam (VALIUM) tablet 2  mg  2 mg Oral BID Sarina Ill, DO       ezetimibe (ZETIA) tablet 10 mg  10 mg Oral Daily Sarina Ill, DO   10 mg at 06/14/23 0900   folic acid (FOLVITE) tablet 1 mg  1 mg Oral Daily Sarina Ill, DO   1 mg at 06/14/23 0900   gabapentin (NEURONTIN) capsule 100 mg  100 mg Oral TID Sarina Ill, DO       losartan (COZAAR) tablet 50 mg  50 mg Oral QPC breakfast Sarina Ill, DO   50 mg at 06/14/23 0859   magnesium hydroxide (MILK OF MAGNESIA) suspension 30 mL  30 mL Oral Daily PRN Lauree Chandler, NP       mirtazapine (REMERON) tablet 15 mg  15 mg Oral QHS Sarina Ill, DO   15 mg at 06/13/23 2130   multivitamin with minerals tablet 1 tablet  1 tablet Oral Daily Sarina Ill, DO   1 tablet at 06/14/23 0900   nicotine (NICODERM CQ - dosed in mg/24 hours) patch 14 mg  14 mg Transdermal Daily Sarina Ill, DO   14 mg at 06/14/23 0856   OLANZapine (ZYPREXA) tablet 5 mg  5 mg Oral Q6H PRN Sarina Ill, DO       OLANZapine [facility]) tablet 5 mg  5 mg Oral QHS Sarina Ill, DO   5 mg at 06/13/23 2131   oxyCODONE (Oxy IR/ROXICODONE) immediate release tablet 10 mg  10 mg Oral Q6H PRN Sarina Ill, DO       thiamine (VITAMIN B1) tablet 100 mg  100 mg Oral Daily Sarina Ill, DO   100 mg at 06/14/23 0900   traZODone (DESYREL) tablet 50 mg  50 mg  Oral QHS PRN Lauree Chandler, NP   50 mg at 06/12/23 2031    Lab Results: No results found for this or any previous visit (from the past 48 hour(s)).  Blood Alcohol level:  Lab Results  Component Value Date   ETH 402 (HH) 06/11/2023   ETH 82 (H) 09/24/2021    Metabolic Disorder Labs: Lab Results  Component Value Date   HGBA1C 5.8 (H) 08/05/2021   MPG 120 08/05/2021   No results found for: "PROLACTIN" Lab Results  Component Value Date   CHOL 149 05/28/2022   TRIG 76 05/28/2022   HDL 79 05/28/2022   CHOLHDL 1.9 05/28/2022   VLDL 9 08/06/2021   LDLCALC 55 05/28/2022   LDLCALC 8 08/06/2021    Physical Findings: AIMS:  , ,  ,  ,    CIWA:  CIWA-Ar Total: 0 COWS:     Musculoskeletal: Strength & Muscle Tone: within normal limits Gait & Station: normal Patient leans: N/A  Psychiatric Specialty Exam:  Presentation  General Appearance:  Disheveled  Eye Contact: Fleeting  Speech: Garbled  Speech Volume: Decreased  Handedness: Right   Mood and Affect  Mood: Anxious; Depressed  Affect: Depressed; Restricted   Thought Process  Thought Processes: Linear  Descriptions of Associations:Intact  Orientation:Full (Time, Place and Person)  Thought Content:Logical  History of Schizophrenia/Schizoaffective disorder:No  Duration of Psychotic Symptoms:No data recorded Hallucinations:No data recorded Ideas of Reference:None  Suicidal Thoughts:No data recorded Homicidal Thoughts:No data recorded  Sensorium  Memory: Immediate Fair; Recent Poor; Remote Fair  Judgment: Impaired  Insight: Lacking   Executive Functions  Concentration: Poor  Attention Span: Poor  Recall: Poor  Fund of Knowledge: Poor  Language: Poor  Psychomotor Activity  Psychomotor Activity:No data recorded  Assets  Assets: Desire for Improvement; Communication Skills; Resilience   Sleep  Sleep:No data recorded   Blood pressure (!) 155/90, pulse 89,  temperature 98.6 F (37 C), resp. rate 16, height 5\' 4"  (1.626 m), weight 46.7 kg, SpO2 100%. Body mass index is 17.68 kg/m.   Treatment Plan Summary: Daily contact with patient to assess and evaluate symptoms and progress in treatment, Medication management, and Plan start oxycodone 10 mg every 6 hours as needed for moderate pain.  Start Neurontin 100 mg 3 times a day.  Continue Remeron.  Sarina Ill, DO 06/14/2023, 11:23 AM

## 2023-06-14 NOTE — Group Note (Signed)
Date:  06/14/2023 Time:  8:48 PM  Group Topic/Focus:  Personal Choices and Values:   The focus of this group is to help patients assess and explore the importance of values in their lives, how their values affect their decisions, how they express their values and what opposes their expression.    Participation Level:  Active  Participation Quality:  Appropriate  Affect:  Appropriate  Cognitive:  Appropriate  Insight: Good  Engagement in Group:  Engaged  Modes of Intervention:  Discussion  Additional Comments:    Burt Ek 06/14/2023, 8:48 PM

## 2023-06-14 NOTE — Group Note (Signed)
Date:  06/14/2023 Time:  11:18 AM  Group Topic/Focus:  Recovery Goals:   The focus of this group is to identify appropriate goals for recovery and establish a plan to achieve them.    Participation Level:  Active  Participation Quality:  Appropriate  Affect:  Appropriate  Cognitive:  Alert  Insight: Appropriate  Engagement in Group:  Defensive  Modes of Intervention:  Education  Additional Comments:    Ardelle Anton 06/14/2023, 11:18 AM

## 2023-06-14 NOTE — BH Assessment (Addendum)
Recreation Therapy Notes  INPATIENT RECREATION THERAPY ASSESSMENT  Patient Details Name: [name] MRN: [id number] DOB: [birth year] Today's Date: 06/14/2023       Information Obtained From: Patient (In addition to chart review)  Able to Participate in Assessment/Interview:  Yes  Patient Presentation: Responsive, Oriented, Alert  Reason for Admission (Per Patient): Active Symptoms ("I had many things on my mind" however pt did not want to elaborate)  Patient Stressors:  ("lots of things")  Coping Skills:   Substance Abuse ("alcohol")  Leisure Interests (2+):  Individual - Phone, Individual - TV  Frequency of Recreation/Participation: Weekly  Awareness of Community Resources:  Yes  Community Resources:  Restaurants ("grocery stores")  Current Use: Yes  Expressed Interest in [employer] Information: No  [place]idence[place]  Patient Main Form of Transportation: Walk ("my truck broke down so my son takes me to the doctor or to pay bills. Otherwise I walk")  Patient Strengths:  "cooking"  Patient Identified Areas of Improvement:  "clean up my house"  Patient Goal for Hospitalization:  "get myself together so I can go home"  Current SI (including self-harm):  No  Current HI:   No  Current AVH: No  Staff Intervention Plan: Group Attendance, Collaborate with Interdisciplinary Treatment Team  Consent to Intern Participation: N/A   [name] presented with a flat affect with most of her answered blunted and short. Pt shared that she has been depressed for years and feels best when she is alone in her room. Pt shared that she does not like crowds or when people stare at her. Pt shared: "I could have a house full of people and I would still feel alone." Pt shared that she has "lots of things" going on but did not care to elaborate. Pt shared that her only coping skill was drinking alcohol. LRT and pt discussed coming to groups and  working on healthy coping skills for depression. Pt was receptive.   [name] E Macallan Ord 06/14/2023, 3:30 PM

## 2023-06-15 ENCOUNTER — Encounter: Payer: Self-pay | Admitting: Nurse Practitioner

## 2023-06-15 DIAGNOSIS — F332 Major depressive disorder, recurrent severe without psychotic features: Secondary | ICD-10-CM | POA: Diagnosis not present

## 2023-06-15 NOTE — Plan of Care (Signed)

## 2023-06-15 NOTE — Progress Notes (Signed)
D- Patient alert and oriented. Denies Anxiety and depression.  Denies SI, HI, AVH, and pain.  A- Scheduled medications administered to patient, per MD orders. Support and encouragement provided.  Routine safety checks conducted every 15 minutes.  Patient informed to notify staff with problems or concerns. R- No adverse drug reactions noted. Patient contracts for safety at this time. Patient compliant with medications and treatment plan. Patient receptive, calm, and cooperative. Min interaction with peers and staff. Attends group. Snacks and fluids offered and accepted.  Patient remains safe at this time.

## 2023-06-15 NOTE — Progress Notes (Signed)
[facility] MD Progress Note  06/15/2023 12:56 PM [name]  MRN:  [id number] Subjective: [name] is seen on rounds.  She is doing well.  She is very pleasant cooperative and smiling.  Nurses report no problems. Principal Problem: MDD (major depressive disorder) Diagnosis: Principal Problem:   MDD (major depressive disorder)  Total Time spent with patient: 15 minutes  Past Psychiatric History: Alcohol and depression  Past Medical History:  Past Medical History:  Diagnosis Date   Allergy    Environmental   Arthritis    Asthma    Carotid artery stenosis    Carotid stenosis    COPD (chronic obstructive pulmonary disease) (HCC)    Coronary artery disease    Coronary artery disease    Hemorrhoids    Hyperlipidemia    Hypertension    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(HCC)    negative cardiac cath   Nicotine addiction    Peripheral vascular disease (HCC)    Pneumonia    Shortness of breath dyspnea    Tobacco abuse     Past Surgical History:  Procedure Laterality Date   ABDOMINAL HYSTERECTOMY     APPENDECTOMY     CARDIAC CATHETERIZATION     CARDIAC SURGERY     CHOLECYSTECTOMY     COLONOSCOPY  12/15/11   OH->bleeding internal hemorrhoids, otherwise normal   COLONOSCOPY WITH PROPOFOL N/A 04/12/2015   Procedure: COLONOSCOPY WITH PROPOFOL;  Surgeon: Midge Minium, MD;  Location: [facility] ENDOSCOPY;  Service: Endoscopy;  Laterality: N/A;   COLONOSCOPY WITH PROPOFOL N/A 01/05/2020   Procedure: COLONOSCOPY WITH PROPOFOL;  Surgeon: Midge Minium, MD;  Location: [facility] ENDOSCOPY;  Service: Endoscopy;  Laterality: N/A;   ENDARTERECTOMY FEMORAL Left 04/26/2015   Procedure: ENDARTERECTOMY FEMORAL;  Surgeon: Annice Needy, MD;  Location: [facility] ORS;  Service: Vascular;  Laterality: Left;   ENDOVASCULAR STENT INSERTION Bilateral    Legs   FLEXIBLE BRONCHOSCOPY N/A 02/02/2015   Procedure: FLEXIBLE BRONCHOSCOPY;  Surgeon: Yevonne Pax, MD;  Location: [facility] ORS;  Service: Pulmonary;  Laterality: N/A;   HEMORRHOID SURGERY      PERIPHERAL VASCULAR CATHETERIZATION Left 04/25/2015   Procedure: Lower Extremity Angiography;  Surgeon: Annice Needy, MD;  Location: [facility] INVASIVE CV LAB;  Service: Cardiovascular;  Laterality: Left;   PERIPHERAL VASCULAR CATHETERIZATION Left 04/25/2015   Procedure: Lower Extremity Intervention;  Surgeon: Annice Needy, MD;  Location: [facility] INVASIVE CV LAB;  Service: Cardiovascular;  Laterality: Left;   PERIPHERAL VASCULAR CATHETERIZATION Left 01/02/2016   Procedure: Lower Extremity Angiography;  Surgeon: Annice Needy, MD;  Location: [facility] INVASIVE CV LAB;  Service: Cardiovascular;  Laterality: Left;   PERIPHERAL VASCULAR CATHETERIZATION  01/02/2016   Procedure: Lower Extremity Intervention;  Surgeon: Annice Needy, MD;  Location: [facility] INVASIVE CV LAB;  Service: Cardiovascular;;   THROMBECTOMY FEMORAL ARTERY Left 04/26/2015   Procedure: THROMBECTOMY FEMORAL ARTERY;  Surgeon: Annice Needy, MD;  Location: [facility] ORS;  Service: Vascular;  Laterality: Left;   Family History:  Family History  Problem Relation Age of Onset   Hypertension Mother    Hypertension Father    Family Psychiatric  History: Unremarkable Social History:  Social History   Substance and Sexual Activity  Alcohol Use Yes   Alcohol/week: 0.0 standard drinks of alcohol   Comment: weekends, couple beers, pint of liquer only on weekends.  Last drank approx 1 month ago.     Social History   Substance and Sexual Activity  Drug Use No    Social History   Socioeconomic  History   Marital status: Single    Spouse name: Not on file   Number of children: Not on file   Years of education: Not on file   Highest education level: Not on file  Occupational History   Not on file  Tobacco Use   Smoking status: Every Day    Current packs/day: 1.00    Average packs/day: 1 pack/day for 41.0 years (41.0 ttl pk-yrs)    Types: Cigarettes   Smokeless tobacco: Never   Tobacco comments:    Less than a 1/2 pack daily  Vaping Use   Vaping status:  Never Used  Substance and Sexual Activity   Alcohol use: Yes    Alcohol/week: 0.0 standard drinks of alcohol    Comment: weekends, couple beers, pint of liquer only on weekends.  Last drank approx 1 month ago.   Drug use: No   Sexual activity: Not Currently  Other Topics Concern   Not on file  Social History Narrative   Lives with family    Social Determinants of Health   Financial Resource Strain: Low Risk  (03/21/2023)   Received from [facility]   Overall Financial Resource Strain (CARDIA)    Difficulty of Paying Living Expenses: Not hard at all  Food Insecurity: No Food Insecurity (06/12/2023)   Hunger Vital Sign    Worried About Running Out of Food in the Last Year: Never true    Ran Out of Food in the Last Year: Never true  Transportation Needs: No Transportation Needs (06/12/2023)   PRAPARE - Administrator, Civil Service (Medical): No    Lack of Transportation (Non-Medical): No  Physical Activity: Inactive (04/04/2018)   Exercise Vital Sign    Days of Exercise per Week: 0 days    Minutes of Exercise per Session: 0 min  Stress: No Stress Concern Present (04/04/2018)   [employer] of Occupational Health - Occupational Stress Questionnaire    Feeling of Stress : Only a little  Social Connections: Moderately Isolated (04/04/2018)   Social Connection and Isolation Panel [NHANES]    Frequency of Communication with Friends and Family: Once a week    Frequency of Social Gatherings with Friends and Family: Once a week    Attends Religious Services: 1 to 4 times per year    Active Member of [employer] or Organizations: No    Attends Banker Meetings: Never    Marital Status: Never married   Additional Social History:                         Sleep: Good  Appetite:  Good  Current Medications: Current Facility-Administered Medications  Medication Dose Route Frequency Provider Last Rate Last Admin   acetaminophen (TYLENOL) tablet 650 mg  650  mg Oral Q6H PRN Lauree Chandler, NP   650 mg at 06/13/23 1831   albuterol (VENTOLIN HFA) 108 (90 Base) MCG/ACT inhaler 2 puff  2 puff Inhalation Q6H PRN Lauree Chandler, NP       alum & mag hydroxide-simeth (MAALOX/MYLANTA) 200-200-20 MG/5ML suspension 30 mL  30 mL Oral Q4H PRN Lauree Chandler, NP       diazepam (VALIUM) tablet 2 mg  2 mg Oral BID Sarina Ill, DO   2 mg at 06/15/23 0910   ezetimibe (ZETIA) tablet 10 mg  10 mg Oral Daily Sarina Ill, DO   10 mg at 06/15/23 1036   folic acid (  FOLVITE) tablet 1 mg  1 mg Oral Daily Sarina Ill, DO   1 mg at 06/15/23 4401   gabapentin (NEURONTIN) capsule 100 mg  100 mg Oral TID Sarina Ill, DO   100 mg at 06/15/23 0910   losartan (COZAAR) tablet 50 mg  50 mg Oral QPC breakfast Sarina Ill, DO   50 mg at 06/15/23 0910   magnesium hydroxide (MILK OF MAGNESIA) suspension 30 mL  30 mL Oral Daily PRN Lauree Chandler, NP       mirtazapine (REMERON) tablet 15 mg  15 mg Oral QHS Sarina Ill, DO   15 mg at 06/14/23 2121   multivitamin with minerals tablet 1 tablet  1 tablet Oral Daily Sarina Ill, DO   1 tablet at 06/15/23 0272   nicotine (NICODERM CQ - dosed in mg/24 hours) patch 14 mg  14 mg Transdermal Daily Sarina Ill, DO   14 mg at 06/15/23 0913   OLANZapine (ZYPREXA) tablet 5 mg  5 mg Oral Q6H PRN Sarina Ill, DO       OLANZapine [facility]) tablet 5 mg  5 mg Oral QHS Sarina Ill, DO   5 mg at 06/14/23 2121   oxyCODONE (Oxy IR/ROXICODONE) immediate release tablet 10 mg  10 mg Oral Q6H PRN Sarina Ill, DO       thiamine (VITAMIN B1) tablet 100 mg  100 mg Oral Daily Sarina Ill, DO   100 mg at 06/15/23 0910   traZODone (DESYREL) tablet 50 mg  50 mg Oral QHS PRN Lauree Chandler, NP   50 mg at 06/12/23 2031    Lab Results: No results found for this or any previous visit (from the past 48  hour(s)).  Blood Alcohol level:  Lab Results  Component Value Date   ETH 402 (HH) 06/11/2023   ETH 82 (H) 09/24/2021    Metabolic Disorder Labs: Lab Results  Component Value Date   HGBA1C 5.8 (H) 08/05/2021   MPG 120 08/05/2021   No results found for: "PROLACTIN" Lab Results  Component Value Date   CHOL 149 05/28/2022   TRIG 76 05/28/2022   HDL 79 05/28/2022   CHOLHDL 1.9 05/28/2022   VLDL 9 08/06/2021   LDLCALC 55 05/28/2022   LDLCALC 8 08/06/2021    Physical Findings: AIMS:  , ,  ,  ,    CIWA:  CIWA-Ar Total: 0 COWS:     Musculoskeletal: Strength & Muscle Tone: within normal limits Gait & Station: normal Patient leans: Right  Psychiatric Specialty Exam:  Presentation  General Appearance:  Disheveled  Eye Contact: Fleeting  Speech: Garbled  Speech Volume: Decreased  Handedness: Right   Mood and Affect  Mood: Anxious; Depressed  Affect: Depressed; Restricted   Thought Process  Thought Processes: Linear  Descriptions of Associations:Intact  Orientation:Full (Time, Place and Person)  Thought Content:Logical  History of Schizophrenia/Schizoaffective disorder:No  Duration of Psychotic Symptoms:No data recorded Hallucinations:No data recorded Ideas of Reference:None  Suicidal Thoughts:No data recorded Homicidal Thoughts:No data recorded  Sensorium  Memory: Immediate Fair; Recent Poor; Remote Fair  Judgment: Impaired  Insight: Lacking   Executive Functions  Concentration: Poor  Attention Span: Poor  Recall: Poor  Fund of Knowledge: Poor  Language: Poor   Psychomotor Activity  Psychomotor Activity:No data recorded  Assets  Assets: Desire for Improvement; Communication Skills; Resilience   Sleep  Sleep:No data recorded    Blood pressure 96/77, pulse 78, temperature 97.9 F (36.6  C), resp. rate 16, height 5\' 4"  (1.626 m), weight 46.7 kg, SpO2 100%. Body mass index is 17.68 kg/m.   Treatment Plan  Summary: Daily contact with patient to assess and evaluate symptoms and progress in treatment, Medication management, and Plan continue current medications.  Sarina Ill, DO 06/15/2023, 12:56 PM

## 2023-06-15 NOTE — Group Note (Signed)
[facility] LCSW Group Therapy Note    Group Date: 06/15/2023 Start Time: 1400 End Time: 1430  Type of Therapy and Topic:  Group Therapy:  Overcoming Obstacles  Participation Level:  [facility] PARTICIPATION LEVEL: Did Not Attend  Mood:  Description of Group:   In this group patients will be encouraged to explore what they see as obstacles to their own wellness and recovery. They will be guided to discuss their thoughts, feelings, and behaviors related to these obstacles. The group will process together ways to cope with barriers, with attention given to specific choices patients can make. Each patient will be challenged to identify changes they are motivated to make in order to overcome their obstacles. This group will be process-oriented, with patients participating in exploration of their own experiences as well as giving and receiving support and challenge from other group members.  Therapeutic Goals: 1. Patient will identify personal and current obstacles as they relate to admission. 2. Patient will identify barriers that currently interfere with their wellness or overcoming obstacles.  3. Patient will identify feelings, thought process and behaviors related to these barriers. 4. Patient will identify two changes they are willing to make to overcome these obstacles:    Summary of Patient Progress   X   Therapeutic Modalities:   Cognitive Behavioral Therapy Solution Focused Therapy Motivational Interviewing Relapse Prevention Therapy   [name], LCSWA

## 2023-06-15 NOTE — Plan of Care (Signed)
D: Pt alert and oriented. Pt denies experiencing any anxiety/depression at this time. Pt reports experiencing 7/10 right abdominal pain at this time, scheduled medication given. Pt denies experiencing any SI/HI, or AVH at this time.   A: Scheduled medications administered to pt, per MD orders. Support and encouragement provided. Frequent verbal contact made. Routine safety checks conducted q15 minutes.   R: No adverse drug reactions noted. Pt verbally contracts for safety at this time. Pt compliant with medications and treatment plan. Pt interacts well with others on the unit. Pt remains safe at this time. Plan of care ongoing.  Problem: Education: Goal: Knowledge of General Education information will improve Description: Including pain rating scale, medication(s)/side effects and non-pharmacologic comfort measures Outcome: Progressing   Problem: Nutrition: Goal: Adequate nutrition will be maintained Outcome: Progressing

## 2023-06-15 NOTE — Progress Notes (Signed)
   06/15/23 5409  15 Minute Checks  Location Bedroom  Visual Appearance Calm  Behavior Sleeping  Sleep (Behavioral Health Patients Only)  Calculate sleep? (Click Yes once per 24 hr at 0600 safety check) Yes  Documented sleep last 24 hours 9.25

## 2023-06-15 NOTE — Group Note (Signed)
Date:  06/15/2023 Time:  9:22 PM  Group Topic/Focus:  Building Self Esteem:   The Focus of this group is helping patients become aware of the effects of self-esteem on their lives, the things they and others do that enhance or undermine their self-esteem, seeing the relationship between their level of self-esteem and the choices they make and learning ways to enhance self-esteem. Crisis Planning:   The purpose of this group is to help patients create a crisis plan for use upon discharge or in the future, as needed.    Participation Level:  Active  Participation Quality:  Appropriate  Affect:  Appropriate  Cognitive:  Appropriate  Insight: Appropriate  Engagement in Group:  Engaged  Modes of Intervention:  Socialization  Additional Comments:    [name] [name] 06/15/2023, 9:22 PM

## 2023-06-15 NOTE — Plan of Care (Signed)

## 2023-06-16 DIAGNOSIS — F332 Major depressive disorder, recurrent severe without psychotic features: Secondary | ICD-10-CM | POA: Diagnosis not present

## 2023-06-16 MED ORDER — DIAZEPAM 2 MG PO TABS
1.0000 mg | ORAL_TABLET | Freq: Two times a day (BID) | ORAL | Status: DC
  Administered 2023-06-16 – 2023-06-17 (×2): 1 mg via ORAL
  Filled 2023-06-16 (×2): qty 1

## 2023-06-16 NOTE — Progress Notes (Signed)
[facility] MD Progress Note  06/16/2023 1:32 PM [name]  MRN:  [id number] Subjective: [name] is seen on rounds.  She is doing really well.  No withdrawal symptoms.  She has been compliant with medications without any side effects.  Nurses report no issues.  She states that she is feeling better. Principal Problem: MDD (major depressive disorder) Diagnosis: Principal Problem:   MDD (major depressive disorder)  Total Time spent with patient: 15 minutes  Past Psychiatric History: Depression and alcohol abuse  Past Medical History:  Past Medical History:  Diagnosis Date   Allergy    Environmental   Arthritis    Asthma    Carotid artery stenosis    Carotid stenosis    COPD (chronic obstructive pulmonary disease) (HCC)    Coronary artery disease    Coronary artery disease    Hemorrhoids    Hyperlipidemia    Hypertension    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(HCC)    negative cardiac cath   Nicotine addiction    Peripheral vascular disease (HCC)    Pneumonia    Shortness of breath dyspnea    Tobacco abuse     Past Surgical History:  Procedure Laterality Date   ABDOMINAL HYSTERECTOMY     APPENDECTOMY     CARDIAC CATHETERIZATION     CARDIAC SURGERY     CHOLECYSTECTOMY     COLONOSCOPY  12/15/11   OH->bleeding internal hemorrhoids, otherwise normal   COLONOSCOPY WITH PROPOFOL N/A 04/12/2015   Procedure: COLONOSCOPY WITH PROPOFOL;  Surgeon: Midge Minium, MD;  Location: [facility] ENDOSCOPY;  Service: Endoscopy;  Laterality: N/A;   COLONOSCOPY WITH PROPOFOL N/A 01/05/2020   Procedure: COLONOSCOPY WITH PROPOFOL;  Surgeon: Midge Minium, MD;  Location: [facility] ENDOSCOPY;  Service: Endoscopy;  Laterality: N/A;   ENDARTERECTOMY FEMORAL Left 04/26/2015   Procedure: ENDARTERECTOMY FEMORAL;  Surgeon: Annice Needy, MD;  Location: [facility] ORS;  Service: Vascular;  Laterality: Left;   ENDOVASCULAR STENT INSERTION Bilateral    Legs   FLEXIBLE BRONCHOSCOPY N/A 02/02/2015   Procedure: FLEXIBLE BRONCHOSCOPY;  Surgeon: Yevonne Pax, MD;  Location: [facility] ORS;  Service: Pulmonary;  Laterality: N/A;   HEMORRHOID SURGERY     PERIPHERAL VASCULAR CATHETERIZATION Left 04/25/2015   Procedure: Lower Extremity Angiography;  Surgeon: Annice Needy, MD;  Location: [facility] INVASIVE CV LAB;  Service: Cardiovascular;  Laterality: Left;   PERIPHERAL VASCULAR CATHETERIZATION Left 04/25/2015   Procedure: Lower Extremity Intervention;  Surgeon: Annice Needy, MD;  Location: [facility] INVASIVE CV LAB;  Service: Cardiovascular;  Laterality: Left;   PERIPHERAL VASCULAR CATHETERIZATION Left 01/02/2016   Procedure: Lower Extremity Angiography;  Surgeon: Annice Needy, MD;  Location: [facility] INVASIVE CV LAB;  Service: Cardiovascular;  Laterality: Left;   PERIPHERAL VASCULAR CATHETERIZATION  01/02/2016   Procedure: Lower Extremity Intervention;  Surgeon: Annice Needy, MD;  Location: [facility] INVASIVE CV LAB;  Service: Cardiovascular;;   THROMBECTOMY FEMORAL ARTERY Left 04/26/2015   Procedure: THROMBECTOMY FEMORAL ARTERY;  Surgeon: Annice Needy, MD;  Location: [facility] ORS;  Service: Vascular;  Laterality: Left;   Family History:  Family History  Problem Relation Age of Onset   Hypertension Mother    Hypertension Father    Family Psychiatric  History: Unremarkable Social History:  Social History   Substance and Sexual Activity  Alcohol Use Yes   Alcohol/week: 0.0 standard drinks of alcohol   Comment: weekends, couple beers, pint of liquer only on weekends.  Last drank approx 1 month ago.     Social History  Substance and Sexual Activity  Drug Use No    Social History   Socioeconomic History   Marital status: Single    Spouse name: Not on file   Number of children: Not on file   Years of education: Not on file   Highest education level: Not on file  Occupational History   Not on file  Tobacco Use   Smoking status: Every Day    Current packs/day: 1.00    Average packs/day: 1 pack/day for 41.0 years (41.0 ttl pk-yrs)    Types: Cigarettes   Smokeless  tobacco: Never   Tobacco comments:    Less than a 1/2 pack daily  Vaping Use   Vaping status: Never Used  Substance and Sexual Activity   Alcohol use: Yes    Alcohol/week: 0.0 standard drinks of alcohol    Comment: weekends, couple beers, pint of liquer only on weekends.  Last drank approx 1 month ago.   Drug use: No   Sexual activity: Not Currently  Other Topics Concern   Not on file  Social History Narrative   Lives with family    Social Determinants of Health   Financial Resource Strain: Low Risk  (03/21/2023)   Received from [facility]   Overall Financial Resource Strain (CARDIA)    Difficulty of Paying Living Expenses: Not hard at all  Food Insecurity: No Food Insecurity (06/12/2023)   Hunger Vital Sign    Worried About Running Out of Food in the Last Year: Never true    Ran Out of Food in the Last Year: Never true  Transportation Needs: No Transportation Needs (06/12/2023)   PRAPARE - Administrator, Civil Service (Medical): No    Lack of Transportation (Non-Medical): No  Physical Activity: Inactive (04/04/2018)   Exercise Vital Sign    Days of Exercise per Week: 0 days    Minutes of Exercise per Session: 0 min  Stress: No Stress Concern Present (04/04/2018)   [employer] of Occupational Health - Occupational Stress Questionnaire    Feeling of Stress : Only a little  Social Connections: Moderately Isolated (04/04/2018)   Social Connection and Isolation Panel [NHANES]    Frequency of Communication with Friends and Family: Once a week    Frequency of Social Gatherings with Friends and Family: Once a week    Attends Religious Services: 1 to 4 times per year    Active Member of [employer] or Organizations: No    Attends Banker Meetings: Never    Marital Status: Never married   Additional Social History:                         Sleep: Good  Appetite:  Good  Current Medications: Current Facility-Administered Medications   Medication Dose Route Frequency Provider Last Rate Last Admin   acetaminophen (TYLENOL) tablet 650 mg  650 mg Oral Q6H PRN Lauree Chandler, NP   650 mg at 06/13/23 1831   albuterol (VENTOLIN HFA) 108 (90 Base) MCG/ACT inhaler 2 puff  2 puff Inhalation Q6H PRN Lauree Chandler, NP       alum & mag hydroxide-simeth (MAALOX/MYLANTA) 200-200-20 MG/5ML suspension 30 mL  30 mL Oral Q4H PRN Lauree Chandler, NP       diazepam (VALIUM) tablet 1 mg  1 mg Oral BID Sarina Ill, DO       ezetimibe (ZETIA) tablet 10 mg  10 mg Oral Daily Elane Fritz  Edward, DO   10 mg at 06/16/23 1610   folic acid (FOLVITE) tablet 1 mg  1 mg Oral Daily Sarina Ill, DO   1 mg at 06/16/23 9604   gabapentin (NEURONTIN) capsule 100 mg  100 mg Oral TID Sarina Ill, DO   100 mg at 06/16/23 5409   losartan (COZAAR) tablet 50 mg  50 mg Oral QPC breakfast Sarina Ill, DO   50 mg at 06/16/23 8119   magnesium hydroxide (MILK OF MAGNESIA) suspension 30 mL  30 mL Oral Daily PRN Lauree Chandler, NP       mirtazapine (REMERON) tablet 15 mg  15 mg Oral QHS Sarina Ill, DO   15 mg at 06/15/23 2127   multivitamin with minerals tablet 1 tablet  1 tablet Oral Daily Sarina Ill, DO   1 tablet at 06/16/23 1478   nicotine (NICODERM CQ - dosed in mg/24 hours) patch 14 mg  14 mg Transdermal Daily Sarina Ill, DO   14 mg at 06/16/23 0829   OLANZapine (ZYPREXA) tablet 5 mg  5 mg Oral Q6H PRN Sarina Ill, DO       OLANZapine [facility]) tablet 5 mg  5 mg Oral QHS Sarina Ill, DO   5 mg at 06/15/23 2127   oxyCODONE (Oxy IR/ROXICODONE) immediate release tablet 10 mg  10 mg Oral Q6H PRN Sarina Ill, DO       thiamine (VITAMIN B1) tablet 100 mg  100 mg Oral Daily Sarina Ill, DO   100 mg at 06/16/23 2956   traZODone (DESYREL) tablet 50 mg  50 mg Oral QHS PRN Lauree Chandler, NP   50 mg at 06/12/23 2031     Lab Results: No results found for this or any previous visit (from the past 48 hour(s)).  Blood Alcohol level:  Lab Results  Component Value Date   ETH 402 (HH) 06/11/2023   ETH 82 (H) 09/24/2021    Metabolic Disorder Labs: Lab Results  Component Value Date   HGBA1C 5.8 (H) 08/05/2021   MPG 120 08/05/2021   No results found for: "PROLACTIN" Lab Results  Component Value Date   CHOL 149 05/28/2022   TRIG 76 05/28/2022   HDL 79 05/28/2022   CHOLHDL 1.9 05/28/2022   VLDL 9 08/06/2021   LDLCALC 55 05/28/2022   LDLCALC 8 08/06/2021    Physical Findings: AIMS:  , ,  ,  ,    CIWA:  CIWA-Ar Total: 0 COWS:     Musculoskeletal: Strength & Muscle Tone: within normal limits Gait & Station: normal Patient leans: N/A  Psychiatric Specialty Exam:  Presentation  General Appearance:  Disheveled  Eye Contact: Fleeting  Speech: Garbled  Speech Volume: Decreased  Handedness: Right   Mood and Affect  Mood: Anxious; Depressed  Affect: Depressed; Restricted   Thought Process  Thought Processes: Linear  Descriptions of Associations:Intact  Orientation:Full (Time, Place and Person)  Thought Content:Logical  History of Schizophrenia/Schizoaffective disorder:No  Duration of Psychotic Symptoms:No data recorded Hallucinations:No data recorded Ideas of Reference:None  Suicidal Thoughts:No data recorded Homicidal Thoughts:No data recorded  Sensorium  Memory: Immediate Fair; Recent Poor; Remote Fair  Judgment: Impaired  Insight: Lacking   Executive Functions  Concentration: Poor  Attention Span: Poor  Recall: Poor  Fund of Knowledge: Poor  Language: Poor   Psychomotor Activity  Psychomotor Activity:No data recorded  Assets  Assets: Desire for Improvement; Communication Skills; Resilience   Sleep  Sleep:No data  recorded    Blood pressure (!) 146/96, pulse 88, temperature 97.6 F (36.4 C), resp. rate 16, height 5\' 4"   (1.626 m), weight 46.7 kg, SpO2 100%. Body mass index is 17.68 kg/m.   Treatment Plan Summary: Daily contact with patient to assess and evaluate symptoms and progress in treatment, Medication management, and Plan decrease Valium to 1 mg twice a day.  Deidrea Gaetz Tresea Mall, DO 06/16/2023, 1:32 PM

## 2023-06-16 NOTE — Progress Notes (Signed)
   06/16/23 2130  Psych Admission Type (Psych Patients Only)  Admission Status Involuntary  Psychosocial Assessment  Patient Complaints Depression  Eye Contact Fair  Facial Expression Flat  Affect Appropriate to circumstance  Speech Logical/coherent  Interaction Assertive  Motor Activity Slow  Appearance/Hygiene Unremarkable  Behavior Characteristics Cooperative;Appropriate to situation  Mood Depressed;Pleasant  Thought Process  Coherency WDL  Content WDL  Delusions None reported or observed  Perception WDL  Hallucination None reported or observed  Judgment WDL  Confusion None  Danger to Self  Current suicidal ideation? Denies  Agreement Not to Harm Self Yes  Description of Agreement verbal  Danger to Others  Danger to Others None reported or observed   Progress note   D: Pt seen in dayroom interacting with peers. Pt denies SI, HI, AVH. Pt rates pain  4/10 as right flank pain following surgical procedure in July. Pt rates anxiety  0/10 and depression  5/10. Pt says that the stronger pain medication is helping her. Pt endorses good appetite. Sleep is fair. Trouble falling asleep. Hopefully pain medication will aid in this situation as well. No other concerns noted at this time.  A: Pt provided support and encouragement. Pt given scheduled medication as prescribed. PRNs as appropriate. Q15 min checks for safety.   R: Pt safe on the unit. Will continue to monitor.

## 2023-06-16 NOTE — Progress Notes (Signed)
D- Patient alert and oriented. Pt received in the dayroom tearful and upset. Reports issues going on at home with her son. Denies SI, HI, AVH, and pain.  A- Scheduled medications administered to patient, per MD orders. Support and encouragement provided.  Routine safety checks conducted every 15 minutes.  Patient informed to notify staff with problems or concerns. R- No adverse drug reactions noted. Patient contracts for safety at this time. Patient compliant with medications and treatment plan. Patient receptive, calm, and cooperative. Patient interacts well with others on the unit.  Patient remains safe at this time.

## 2023-06-16 NOTE — Progress Notes (Signed)
   06/16/23 0618  15 Minute Checks  Location Bedroom  Visual Appearance Calm  Behavior Sleeping  Sleep (Behavioral Health Patients Only)  Calculate sleep? (Click Yes once per 24 hr at 0600 safety check) Yes  Documented sleep last 24 hours 9

## 2023-06-16 NOTE — Progress Notes (Signed)
Patient denies SI, HI, and AVH. Patient denies pain and other physical problems at time of assessment. She is calm and cooperative and attended group. She is active and visible in the milieu. She is compliant with medications.

## 2023-06-17 DIAGNOSIS — F332 Major depressive disorder, recurrent severe without psychotic features: Secondary | ICD-10-CM | POA: Diagnosis not present

## 2023-06-17 MED ORDER — OLANZAPINE 5 MG PO TABS
10.0000 mg | ORAL_TABLET | Freq: Every day | ORAL | Status: DC
  Administered 2023-06-17: 10 mg via ORAL
  Filled 2023-06-17: qty 2

## 2023-06-17 NOTE — Group Note (Signed)
Date:  06/17/2023 Time:  1:25 AM  Group Topic/Focus:  Making Healthy Choices:   The focus of this group is to help patients identify negative/unhealthy choices they were using prior to admission and identify positive/healthier coping strategies to replace them upon discharge.    Participation Level:  Active  Participation Quality:  Appropriate  Affect:  Appropriate  Cognitive:  Appropriate  Insight: Appropriate  Engagement in Group:  Engaged  Modes of Intervention:  Discussion  Additional Comments:    Maeola Harman 06/17/2023, 1:25 AM

## 2023-06-17 NOTE — Progress Notes (Signed)
   06/17/23 0556  15 Minute Checks  Location Bedroom  Visual Appearance Calm  Behavior Sleeping  Sleep (Behavioral Health Patients Only)  Calculate sleep? (Click Yes once per 24 hr at 0600 safety check) Yes  Documented sleep last 24 hours 10.25

## 2023-06-17 NOTE — Progress Notes (Signed)
Patient is an involuntary admission to [name] for MDD. Patient is calm, cooperative and friendly with staff and peers, participating in group activities. Requested to receive one PRN for headache. Denies HI, AVH, anxiety, endorsing passive SI and Depression but states she is feeling better. Ambulates slowly but with no need for walker or assistance. Independent with ADL's. Takes medicines whole but one at a time. Will continue to monitor.

## 2023-06-17 NOTE — Group Note (Signed)
Recreation Therapy Group Note   Group Topic:Coping Skills  Group Date: 06/17/2023 Start Time: 1415 End Time: 1500 Facilitators: Rosina Lowenstein, LRT, CTRS Location: Courtyard  Group Description: Music Reminisce. LRT encouraged patients to think of their favorite song(s) that reminded them of a positive memory or time in their life. LRT encouraged patient to talk about that memory aloud to the group. LRT played the song through a speaker for all to hear. LRT and patients discussed how thinking of a positive memory or time in their life can be used as a coping skill in everyday life post discharge.    Goal Area(s) Addressed: Patient will increase verbal communication by conversing with peers. Patient will contribute to group discussion with minimal prompting. Patient will reminisce a positive memory or moment in their life.    Affect/Mood: N/A   Participation Level: Did not attend    Clinical Observations/Individualized Feedback: Roschelle did not attend group.  Plan: Continue to engage patient in RT group sessions 2-3x/week.   Rosina Lowenstein, LRT, CTRS 06/17/2023 3:05 PM

## 2023-06-17 NOTE — Plan of Care (Signed)
Problem: Health Behavior/Discharge Planning: Goal: Ability to manage health-related needs will improve Outcome: Progressing   Problem: Clinical Measurements: Goal: Ability to maintain clinical measurements within normal limits will improve Outcome: Progressing Goal: Will remain free from infection Outcome: Progressing Goal: Respiratory complications will improve Outcome: Progressing Goal: Cardiovascular complication will be avoided Outcome: Progressing   Problem: Activity: Goal: Risk for activity intolerance will decrease Outcome: Progressing

## 2023-06-17 NOTE — Group Note (Signed)
Date:  06/17/2023 Time:  12:03 PM  Group Topic/Focus:  Coping With Mental Health Crisis:   The purpose of this group is to help patients identify strategies for coping with mental health crisis.  Group discusses possible causes of crisis and ways to manage them effectively.    Participation Level:  Did Not Attend  Participation Quality  Affect:    Cognitive:    Insight:   Engagement in Group:    Modes of Intervention:    Additional Comments:    [name] L [name] 06/17/2023, 12:03 PM

## 2023-06-17 NOTE — Progress Notes (Signed)
[facility] MD Progress Note  06/17/2023 10:43 AM [name]  MRN:  [id number] Subjective:  Symptoms [name] is seen on rounds.  She has no complaints.  Nurses report no issues.  She has been pleasant and cooperative on the unit.  She tells me that she is feeling better.  No withdrawal symptoms.  She still feels depressed and having suicidal thoughts but her mood is improving. Principal Problem: MDD (major depressive disorder) Diagnosis: Principal Problem:   MDD (major depressive disorder)  Total Time spent with patient: 15 minutes  Past Psychiatric History: History of alcohol and depression  Past Medical History:  Past Medical History:  Diagnosis Date   Allergy    Environmental   Arthritis    Asthma    Carotid artery stenosis    Carotid stenosis    COPD (chronic obstructive pulmonary disease) (HCC)    Coronary artery disease    Coronary artery disease    Hemorrhoids    Hyperlipidemia    Hypertension    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(HCC)    negative cardiac cath   Nicotine addiction    Peripheral vascular disease (HCC)    Pneumonia    Shortness of breath dyspnea    Tobacco abuse     Past Surgical History:  Procedure Laterality Date   ABDOMINAL HYSTERECTOMY     APPENDECTOMY     CARDIAC CATHETERIZATION     CARDIAC SURGERY     CHOLECYSTECTOMY     COLONOSCOPY  12/15/11   OH->bleeding internal hemorrhoids, otherwise normal   COLONOSCOPY WITH PROPOFOL N/A 04/12/2015   Procedure: COLONOSCOPY WITH PROPOFOL;  Surgeon: Midge Minium, MD;  Location: [facility] ENDOSCOPY;  Service: Endoscopy;  Laterality: N/A;   COLONOSCOPY WITH PROPOFOL N/A 01/05/2020   Procedure: COLONOSCOPY WITH PROPOFOL;  Surgeon: Midge Minium, MD;  Location: [facility] ENDOSCOPY;  Service: Endoscopy;  Laterality: N/A;   ENDARTERECTOMY FEMORAL Left 04/26/2015   Procedure: ENDARTERECTOMY FEMORAL;  Surgeon: Annice Needy, MD;  Location: [facility] ORS;  Service: Vascular;  Laterality: Left;   ENDOVASCULAR STENT INSERTION Bilateral    Legs    FLEXIBLE BRONCHOSCOPY N/A 02/02/2015   Procedure: FLEXIBLE BRONCHOSCOPY;  Surgeon: Yevonne Pax, MD;  Location: [facility] ORS;  Service: Pulmonary;  Laterality: N/A;   HEMORRHOID SURGERY     PERIPHERAL VASCULAR CATHETERIZATION Left 04/25/2015   Procedure: Lower Extremity Angiography;  Surgeon: Annice Needy, MD;  Location: [facility] INVASIVE CV LAB;  Service: Cardiovascular;  Laterality: Left;   PERIPHERAL VASCULAR CATHETERIZATION Left 04/25/2015   Procedure: Lower Extremity Intervention;  Surgeon: Annice Needy, MD;  Location: [facility] INVASIVE CV LAB;  Service: Cardiovascular;  Laterality: Left;   PERIPHERAL VASCULAR CATHETERIZATION Left 01/02/2016   Procedure: Lower Extremity Angiography;  Surgeon: Annice Needy, MD;  Location: [facility] INVASIVE CV LAB;  Service: Cardiovascular;  Laterality: Left;   PERIPHERAL VASCULAR CATHETERIZATION  01/02/2016   Procedure: Lower Extremity Intervention;  Surgeon: Annice Needy, MD;  Location: [facility] INVASIVE CV LAB;  Service: Cardiovascular;;   THROMBECTOMY FEMORAL ARTERY Left 04/26/2015   Procedure: THROMBECTOMY FEMORAL ARTERY;  Surgeon: Annice Needy, MD;  Location: [facility] ORS;  Service: Vascular;  Laterality: Left;   Family History:  Family History  Problem Relation Age of Onset   Hypertension Mother    Hypertension Father    Family Psychiatric  History: Unremarkable Social History:  Social History   Substance and Sexual Activity  Alcohol Use Yes   Alcohol/week: 0.0 standard drinks of alcohol   Comment: weekends, couple beers, pint of liquer only  on weekends.  Last drank approx 1 month ago.     Social History   Substance and Sexual Activity  Drug Use No    Social History   Socioeconomic History   Marital status: Single    Spouse name: Not on file   Number of children: Not on file   Years of education: Not on file   Highest education level: Not on file  Occupational History   Not on file  Tobacco Use   Smoking status: Every Day    Current packs/day: 1.00    Average  packs/day: 1 pack/day for 41.0 years (41.0 ttl pk-yrs)    Types: Cigarettes   Smokeless tobacco: Never   Tobacco comments:    Less than a 1/2 pack daily  Vaping Use   Vaping status: Never Used  Substance and Sexual Activity   Alcohol use: Yes    Alcohol/week: 0.0 standard drinks of alcohol    Comment: weekends, couple beers, pint of liquer only on weekends.  Last drank approx 1 month ago.   Drug use: No   Sexual activity: Not Currently  Other Topics Concern   Not on file  Social History Narrative   Lives with family    Social Determinants of Health   Financial Resource Strain: Low Risk  (03/21/2023)   Received from [facility]   Overall Financial Resource Strain (CARDIA)    Difficulty of Paying Living Expenses: Not hard at all  Food Insecurity: No Food Insecurity (06/12/2023)   Hunger Vital Sign    Worried About Running Out of Food in the Last Year: Never true    Ran Out of Food in the Last Year: Never true  Transportation Needs: No Transportation Needs (06/12/2023)   PRAPARE - Administrator, Civil Service (Medical): No    Lack of Transportation (Non-Medical): No  Physical Activity: Inactive (04/04/2018)   Exercise Vital Sign    Days of Exercise per Week: 0 days    Minutes of Exercise per Session: 0 min  Stress: No Stress Concern Present (04/04/2018)   [employer] of Occupational Health - Occupational Stress Questionnaire    Feeling of Stress : Only a little  Social Connections: Moderately Isolated (04/04/2018)   Social Connection and Isolation Panel [NHANES]    Frequency of Communication with Friends and Family: Once a week    Frequency of Social Gatherings with Friends and Family: Once a week    Attends Religious Services: 1 to 4 times per year    Active Member of [employer] or Organizations: No    Attends Banker Meetings: Never    Marital Status: Never married   Additional Social History:                         Sleep:  Good  Appetite:  Good  Current Medications: Current Facility-Administered Medications  Medication Dose Route Frequency Provider Last Rate Last Admin   acetaminophen (TYLENOL) tablet 650 mg  650 mg Oral Q6H PRN Lauree Chandler, NP   650 mg at 06/13/23 1831   albuterol (VENTOLIN HFA) 108 (90 Base) MCG/ACT inhaler 2 puff  2 puff Inhalation Q6H PRN Lauree Chandler, NP       alum & mag hydroxide-simeth (MAALOX/MYLANTA) 200-200-20 MG/5ML suspension 30 mL  30 mL Oral Q4H PRN Lauree Chandler, NP       ezetimibe (ZETIA) tablet 10 mg  10 mg Oral Daily Sarina Ill, DO  10 mg at 06/17/23 0935   folic acid (FOLVITE) tablet 1 mg  1 mg Oral Daily Sarina Ill, DO   1 mg at 06/17/23 1610   gabapentin (NEURONTIN) capsule 100 mg  100 mg Oral TID Sarina Ill, DO   100 mg at 06/17/23 0935   losartan (COZAAR) tablet 50 mg  50 mg Oral QPC breakfast Sarina Ill, DO   50 mg at 06/17/23 9604   magnesium hydroxide (MILK OF MAGNESIA) suspension 30 mL  30 mL Oral Daily PRN Lauree Chandler, NP       mirtazapine (REMERON) tablet 15 mg  15 mg Oral QHS Sarina Ill, DO   15 mg at 06/16/23 2133   multivitamin with minerals tablet 1 tablet  1 tablet Oral Daily Sarina Ill, DO   1 tablet at 06/17/23 5409   nicotine (NICODERM CQ - dosed in mg/24 hours) patch 14 mg  14 mg Transdermal Daily Sarina Ill, DO   14 mg at 06/16/23 0829   OLANZapine (ZYPREXA) tablet 5 mg  5 mg Oral Q6H PRN Sarina Ill, DO       OLANZapine [facility]) tablet 5 mg  5 mg Oral QHS Sarina Ill, DO   5 mg at 06/16/23 2132   oxyCODONE (Oxy IR/ROXICODONE) immediate release tablet 10 mg  10 mg Oral Q6H PRN Sarina Ill, DO   10 mg at 06/16/23 2141   thiamine (VITAMIN B1) tablet 100 mg  100 mg Oral Daily Sarina Ill, DO   100 mg at 06/17/23 0935   traZODone (DESYREL) tablet 50 mg  50 mg Oral QHS PRN Lauree Chandler, NP    50 mg at 06/12/23 2031    Lab Results: No results found for this or any previous visit (from the past 48 hour(s)).  Blood Alcohol level:  Lab Results  Component Value Date   ETH 402 (HH) 06/11/2023   ETH 82 (H) 09/24/2021    Metabolic Disorder Labs: Lab Results  Component Value Date   HGBA1C 5.8 (H) 08/05/2021   MPG 120 08/05/2021   No results found for: "PROLACTIN" Lab Results  Component Value Date   CHOL 149 05/28/2022   TRIG 76 05/28/2022   HDL 79 05/28/2022   CHOLHDL 1.9 05/28/2022   VLDL 9 08/06/2021   LDLCALC 55 05/28/2022   LDLCALC 8 08/06/2021    Physical Findings: AIMS:  , ,  ,  ,    CIWA:  CIWA-Ar Total: 0 COWS:     Musculoskeletal: Strength & Muscle Tone: within normal limits Gait & Station: normal Patient leans: N/A  Psychiatric Specialty Exam:  Presentation  General Appearance:  Disheveled  Eye Contact: Fleeting  Speech: Garbled  Speech Volume: Decreased  Handedness: Right   Mood and Affect  Mood: Anxious; Depressed  Affect: Depressed; Restricted   Thought Process  Thought Processes: Linear  Descriptions of Associations:Intact  Orientation:Full (Time, Place and Person)  Thought Content:Logical  History of Schizophrenia/Schizoaffective disorder:No  Duration of Psychotic Symptoms:No data recorded Hallucinations:No data recorded Ideas of Reference:None  Suicidal Thoughts:No data recorded Homicidal Thoughts:No data recorded  Sensorium  Memory: Immediate Fair; Recent Poor; Remote Fair  Judgment: Impaired  Insight: Lacking   Executive Functions  Concentration: Poor  Attention Span: Poor  Recall: Poor  Fund of Knowledge: Poor  Language: Poor   Psychomotor Activity  Psychomotor Activity:No data recorded  Assets  Assets: Desire for Improvement; Communication Skills; Resilience   Sleep  Sleep:No data recorded  Physical Exam: Physical Exam Vitals and nursing note reviewed.   Constitutional:      Appearance: Normal appearance. She is normal weight.  Neurological:     General: No focal deficit present.     Mental Status: She is alert and oriented to person, place, and time.  Psychiatric:        Attention and Perception: Attention and perception normal.        Mood and Affect: Mood is depressed. Affect is flat.        Speech: Speech normal.        Behavior: Behavior normal. Behavior is cooperative.        Thought Content: Thought content normal.        Cognition and Memory: Cognition and memory normal.        Judgment: Judgment normal.    Review of Systems  Constitutional: Negative.   HENT: Negative.    Eyes: Negative.   Respiratory: Negative.    Cardiovascular: Negative.   Gastrointestinal: Negative.   Genitourinary: Negative.   Musculoskeletal: Negative.   Skin: Negative.   Neurological: Negative.   Endo/Heme/Allergies: Negative.   Psychiatric/Behavioral:  Positive for depression.    Blood pressure 125/79, pulse 87, temperature (!) 97.4 F (36.3 C), resp. rate 16, height 5\' 4"  (1.626 m), weight 46.7 kg, SpO2 99%. Body mass index is 17.68 kg/m.   Treatment Plan Summary: Daily contact with patient to assess and evaluate symptoms and progress in treatment, Medication management, and Plan increase Zyprexa to 10 mg at bedtime.  Sarina Ill, DO 06/17/2023, 10:43 AM

## 2023-06-18 ENCOUNTER — Ambulatory Visit: Payer: 59 | Admitting: Nurse Practitioner

## 2023-06-18 DIAGNOSIS — F332 Major depressive disorder, recurrent severe without psychotic features: Secondary | ICD-10-CM | POA: Diagnosis not present

## 2023-06-18 DIAGNOSIS — F1010 Alcohol abuse, uncomplicated: Secondary | ICD-10-CM

## 2023-06-18 LAB — LIPID PANEL
Cholesterol: 202 mg/dL — ABNORMAL HIGH (ref 0–200)
HDL: 61 mg/dL (ref >40)
LDL Cholesterol: 104 mg/dL — ABNORMAL HIGH (ref 0–99)
Total CHOL/HDL Ratio: 3.3 {ratio}
Triglycerides: 186 mg/dL — ABNORMAL HIGH (ref <150)
VLDL: 37 mg/dL (ref 0–40)

## 2023-06-18 LAB — HEMOGLOBIN A1C
Hgb A1c MFr Bld: 5.5 % (ref 4.8–5.6)
Mean Plasma Glucose: 111.15 mg/dL

## 2023-06-18 NOTE — Progress Notes (Signed)
   06/18/23 0718  Psych Admission Type (Psych Patients Only)  Admission Status Involuntary  Psychosocial Assessment  Patient Complaints Anxiety;Depression  Eye Contact Fair  Facial Expression Flat  Affect Appropriate to circumstance  Speech Logical/coherent  Interaction Minimal  Motor Activity Slow  Appearance/Hygiene Unremarkable  Behavior Characteristics Cooperative  Mood Pleasant  Thought Process  Coherency WDL  Content WDL  Delusions None reported or observed  Perception Depersonalization  Hallucination None reported or observed  Judgment WDL  Confusion None  Danger to Self  Current suicidal ideation? Denies  Danger to Others  Danger to Others None reported or observed

## 2023-06-18 NOTE — Group Note (Signed)
Date:  06/18/2023 Time:  9:36 PM  Group Topic/Focus:  Building Self Esteem:   The Focus of this group is helping patients become aware of the effects of self-esteem on their lives, the things they and others do that enhance or undermine their self-esteem, seeing the relationship between their level of self-esteem and the choices they make and learning ways to enhance self-esteem.    Participation Level:  Active  Participation Quality:  Appropriate and Attentive  Affect:  Appropriate  Cognitive:  Alert and Appropriate  Insight: Appropriate and Good  Engagement in Group:  Engaged  Modes of Intervention:  Support  Additional Comments:    Jeannette How 06/18/2023, 9:36 PM

## 2023-06-18 NOTE — Group Note (Signed)
Recreation Therapy Group Note   Group Topic:Relaxation  Group Date: 06/18/2023 Start Time: 1400 End Time: 1445 Facilitators: Rosina Lowenstein, LRT, CTRS Location:  Day Room  Group Description: Meditation. LRT and patients discussed what they know about meditation and mindfulness. LRT played a Deep Breathing Meditation exercise script for patients to follow along to. LRT and patients discussed how meditation and deep breathing can be used as a coping skill post--discharge.   Goal Area(s) Addressed: Patient will practice using relaxation technique. Patient will identify a new coping skill.  Patient will follow multistep directions to reduce anxiety and stress.   Affect/Mood: Appropriate   Participation Level: Active and Engaged   Participation Quality: Independent   Behavior: Calm and Cooperative   Speech/Thought Process: Coherent   Insight: Good   Judgement: Good   Modes of Intervention: Activity   Patient Response to Interventions:  Attentive, Engaged, and Receptive   Education Outcome:  Acknowledges education   Clinical Observations/Individualized Feedback: [name] was active in their participation of session activities and group discussion. Pt identified "all I saw was darkness, but at least I tried it". Pt interacted well with LRT and peers duration of session.   Plan: Continue to engage patient in RT group sessions 2-3x/week.   Rosina Lowenstein, LRT, CTRS 06/18/2023 2:58 PM

## 2023-06-18 NOTE — Progress Notes (Signed)
[facility] MD Progress Note  06/18/2023 6:02 PM [name]  MRN:  [id number]  Patient is a 67 year old African-American female who was involuntarily admitted to geriatric psychiatry for alcohol abuse and depression along with suicidal ideation. .  Subjective: Chart reviewed patient case discussed in multidisciplinary meeting today, patient seen during rounds.  Patient reported that she lost her daughter last year in May.  Patient said that her daughter overdosed on fentanyl.  Patient shared that prior to admission she was trying to drink herself to death.  Patient's alcohol level at admission was in 400s.  Patient said that attending groups and being on unit had made her realize that she has other family members including her daughter, son, and grand kids to live for.  Patient said that her 80-year-old grandson talks to her Daily and Questions When she Is going to be Home.  Patient Was Pleasant to Talk to.  Patient Provided with Support and Reassurance.  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  She Feels Close to Her Baseline.  Patient Denies Any Intention or Plan to Harm Herself or Others.  She Denies Any Psychotic or Manic Symptoms.  Principal Problem: MDD (major depressive disorder) Diagnosis: Principal Problem:   MDD (major depressive disorder)  Past Medical History:  Past Medical History:  Diagnosis Date   Allergy    Environmental   Arthritis    Asthma    Carotid artery stenosis    Carotid stenosis    COPD (chronic obstructive pulmonary disease) (HCC)    Coronary artery disease    Coronary artery disease    Hemorrhoids    Hyperlipidemia    Hypertension    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(HCC)    negative cardiac cath   Nicotine addiction    Peripheral vascular disease (HCC)    Pneumonia    Shortness of breath dyspnea    Tobacco abuse     Past Surgical History:  Procedure Laterality Date   ABDOMINAL HYSTERECTOMY     APPENDECTOMY     CARDIAC  CATHETERIZATION     CARDIAC SURGERY     CHOLECYSTECTOMY     COLONOSCOPY  12/15/11   OH->bleeding internal hemorrhoids, otherwise normal   COLONOSCOPY WITH PROPOFOL N/A 04/12/2015   Procedure: COLONOSCOPY WITH PROPOFOL;  Surgeon: Midge Minium, MD;  Location: [facility] ENDOSCOPY;  Service: Endoscopy;  Laterality: N/A;   COLONOSCOPY WITH PROPOFOL N/A 01/05/2020   Procedure: COLONOSCOPY WITH PROPOFOL;  Surgeon: Midge Minium, MD;  Location: [facility] ENDOSCOPY;  Service: Endoscopy;  Laterality: N/A;   ENDARTERECTOMY FEMORAL Left 04/26/2015   Procedure: ENDARTERECTOMY FEMORAL;  Surgeon: Annice Needy, MD;  Location: [facility] ORS;  Service: Vascular;  Laterality: Left;   ENDOVASCULAR STENT INSERTION Bilateral    Legs   FLEXIBLE BRONCHOSCOPY N/A 02/02/2015   Procedure: FLEXIBLE BRONCHOSCOPY;  Surgeon: Yevonne Pax, MD;  Location: [facility] ORS;  Service: Pulmonary;  Laterality: N/A;   HEMORRHOID SURGERY     PERIPHERAL VASCULAR CATHETERIZATION Left 04/25/2015   Procedure: Lower Extremity Angiography;  Surgeon: Annice Needy, MD;  Location: [facility] INVASIVE CV LAB;  Service: Cardiovascular;  Laterality: Left;   PERIPHERAL VASCULAR CATHETERIZATION Left 04/25/2015   Procedure: Lower Extremity Intervention;  Surgeon: Annice Needy, MD;  Location: [facility] INVASIVE CV LAB;  Service: Cardiovascular;  Laterality: Left;   PERIPHERAL VASCULAR CATHETERIZATION Left 01/02/2016   Procedure: Lower Extremity Angiography;  Surgeon: Annice Needy, MD;  Location: [facility] INVASIVE CV LAB;  Service: Cardiovascular;  Laterality: Left;  PERIPHERAL VASCULAR CATHETERIZATION  01/02/2016   Procedure: Lower Extremity Intervention;  Surgeon: Annice Needy, MD;  Location: [facility] INVASIVE CV LAB;  Service: Cardiovascular;;   THROMBECTOMY FEMORAL ARTERY Left 04/26/2015   Procedure: THROMBECTOMY FEMORAL ARTERY;  Surgeon: Annice Needy, MD;  Location: [facility] ORS;  Service: Vascular;  Laterality: Left;   Family History:  Family History  Problem Relation Age of Onset   Hypertension  Mother    Hypertension Father     Social History:  Social History   Substance and Sexual Activity  Alcohol Use Yes   Alcohol/week: 0.0 standard drinks of alcohol   Comment: weekends, couple beers, pint of liquer only on weekends.  Last drank approx 1 month ago.     Social History   Substance and Sexual Activity  Drug Use No    Social History   Socioeconomic History   Marital status: Single    Spouse name: Not on file   Number of children: Not on file   Years of education: Not on file   Highest education level: Not on file  Occupational History   Not on file  Tobacco Use   Smoking status: Every Day    Current packs/day: 1.00    Average packs/day: 1 pack/day for 41.0 years (41.0 ttl pk-yrs)    Types: Cigarettes   Smokeless tobacco: Never   Tobacco comments:    Less than a 1/2 pack daily  Vaping Use   Vaping status: Never Used  Substance and Sexual Activity   Alcohol use: Yes    Alcohol/week: 0.0 standard drinks of alcohol    Comment: weekends, couple beers, pint of liquer only on weekends.  Last drank approx 1 month ago.   Drug use: No   Sexual activity: Not Currently  Other Topics Concern   Not on file  Social History Narrative   Lives with family    Social Determinants of Health   Financial Resource Strain: Low Risk  (03/21/2023)   Received from [facility]   Overall Financial Resource Strain (CARDIA)    Difficulty of Paying Living Expenses: Not hard at all  Food Insecurity: No Food Insecurity (06/12/2023)   Hunger Vital Sign    Worried About Running Out of Food in the Last Year: Never true    Ran Out of Food in the Last Year: Never true  Transportation Needs: No Transportation Needs (06/12/2023)   PRAPARE - Administrator, Civil Service (Medical): No    Lack of Transportation (Non-Medical): No  Physical Activity: Inactive (04/04/2018)   Exercise Vital Sign    Days of Exercise per Week: 0 days    Minutes of Exercise per Session: 0 min   Stress: No Stress Concern Present (04/04/2018)   [employer] of Occupational Health - Occupational Stress Questionnaire    Feeling of Stress : Only a little  Social Connections: Moderately Isolated (04/04/2018)   Social Connection and Isolation Panel [NHANES]    Frequency of Communication with Friends and Family: Once a week    Frequency of Social Gatherings with Friends and Family: Once a week    Attends Religious Services: 1 to 4 times per year    Active Member of [employer] or Organizations: No    Attends Banker Meetings: Never    Marital Status: Never married   Additional Social History:                         Sleep:  Fair  Appetite:  Fair  Current Medications: Current Facility-Administered Medications  Medication Dose Route Frequency Provider Last Rate Last Admin   acetaminophen (TYLENOL) tablet 650 mg  650 mg Oral Q6H PRN Lauree Chandler, NP   650 mg at 06/17/23 1117   albuterol (VENTOLIN HFA) 108 (90 Base) MCG/ACT inhaler 2 puff  2 puff Inhalation Q6H PRN Lauree Chandler, NP       alum & mag hydroxide-simeth (MAALOX/MYLANTA) 200-200-20 MG/5ML suspension 30 mL  30 mL Oral Q4H PRN Lauree Chandler, NP       ezetimibe (ZETIA) tablet 10 mg  10 mg Oral Daily Sarina Ill, DO   10 mg at 06/18/23 1048   folic acid (FOLVITE) tablet 1 mg  1 mg Oral Daily Sarina Ill, DO   1 mg at 06/18/23 1048   gabapentin (NEURONTIN) capsule 100 mg  100 mg Oral TID Sarina Ill, DO   100 mg at 06/18/23 1653   losartan (COZAAR) tablet 50 mg  50 mg Oral QPC breakfast Sarina Ill, DO   50 mg at 06/18/23 1048   magnesium hydroxide (MILK OF MAGNESIA) suspension 30 mL  30 mL Oral Daily PRN Lauree Chandler, NP       mirtazapine (REMERON) tablet 15 mg  15 mg Oral QHS Sarina Ill, DO   15 mg at 06/17/23 2115   multivitamin with minerals tablet 1 tablet  1 tablet Oral Daily Sarina Ill, DO   1 tablet at  06/18/23 1048   nicotine (NICODERM CQ - dosed in mg/24 hours) patch 14 mg  14 mg Transdermal Daily Sarina Ill, DO   14 mg at 06/18/23 1114   OLANZapine (ZYPREXA) tablet 10 mg  10 mg Oral QHS Sarina Ill, DO   10 mg at 06/17/23 2113   OLANZapine (ZYPREXA) tablet 5 mg  5 mg Oral Q6H PRN Sarina Ill, DO       oxyCODONE (Oxy IR/ROXICODONE) immediate release tablet 10 mg  10 mg Oral Q6H PRN Sarina Ill, DO   10 mg at 06/17/23 2115   thiamine (VITAMIN B1) tablet 100 mg  100 mg Oral Daily Sarina Ill, DO   100 mg at 06/18/23 1048   traZODone (DESYREL) tablet 50 mg  50 mg Oral QHS PRN Lauree Chandler, NP   50 mg at 06/12/23 2031    Lab Results:  Results for orders placed or performed during the hospital encounter of 06/12/23 (from the past 48 hour(s))  Hemoglobin A1c     Status: None   Collection Time: 06/18/23  7:07 AM  Result Value Ref Range   Hgb A1c MFr Bld 5.5 4.8 - 5.6 %    Comment: (NOTE) Pre diabetes:          5.7%-6.4%  Diabetes:              >6.4%  Glycemic control for   <7.0% adults with diabetes    Mean Plasma Glucose 111.15 mg/dL    Comment: Performed at [facility], [address] [place], [place] [address]  Lipid panel     Status: Abnormal   Collection Time: 06/18/23  7:07 AM  Result Value Ref Range   Cholesterol 202 (H) 0 - 200 mg/dL   Triglycerides 604 (H) <150 mg/dL   HDL 61 >54 mg/dL   Total CHOL/HDL Ratio 3.3 RATIO   VLDL 37 0 - 40 mg/dL   LDL Cholesterol 098 (H)  0 - 99 mg/dL    Comment:        Total Cholesterol/HDL:CHD Risk Coronary Heart Disease Risk Table                     Men   Women  1/2 Average Risk   3.4   3.3  Average Risk       5.0   4.4  2 X Average Risk   9.6   7.1  3 X Average Risk  23.4   11.0        Use the calculated Patient Ratio above and the CHD Risk Table to determine the patient's CHD Risk.        ATP III CLASSIFICATION (LDL):  <100     mg/dL   Optimal  829-562   mg/dL   Near or Above                    Optimal  130-159  mg/dL   Borderline  130-865  mg/dL   High  >784     mg/dL   Very High Performed at [facility], [address] [place], [place] [address]     Blood Alcohol level:  Lab Results  Component Value Date   ETH 402 [facility]) 06/11/2023   ETH 82 (H) 09/24/2021    Metabolic Disorder Labs: Lab Results  Component Value Date   HGBA1C 5.5 06/18/2023   MPG 111.15 06/18/2023   MPG 120 08/05/2021   No results found for: "PROLACTIN" Lab Results  Component Value Date   CHOL 202 (H) 06/18/2023   TRIG 186 (H) 06/18/2023   HDL 61 06/18/2023   CHOLHDL 3.3 06/18/2023   VLDL 37 06/18/2023   LDLCALC 104 (H) 06/18/2023   LDLCALC 55 05/28/2022    Physical Findings: AIMS:  , ,  ,  ,    CIWA:  CIWA-Ar Total: 0 COWS:     Musculoskeletal: Strength & Muscle Tone: within normal limits Gait & Station: normal Patient leans: N/A  Psychiatric Specialty Exam:  Presentation  General Appearance:  Appropriate  Eye Contact: Good  Speech: Spontaneous  Speech Volume: Normal  Handedness: Right   Mood and Affect  Mood: "Better"  Affect: Stable, animated,   Thought Process  Thought Processes: Linear  Descriptions of Associations:Intact  Orientation:Full (Time, Place and Person)  Thought Content:Logical  History of Schizophrenia/Schizoaffective disorder:No  Duration of Psychotic Symptoms:NA Hallucinations:Denies Ideas of Reference:None  Suicidal Thoughts:Denies Homicidal Thoughts:Denies  Sensorium  Memory: Fair  Judgment: Improved  Insight: Improved   Executive Functions  Concentration: Fair  Attention Span: Fair  Recall: [employer] of Knowledge: Fair  Language: Fair   Psychomotor Activity  Psychomotor Activity:Normal  Assets  Assets: Desire for Improvement; Communication Skills; Resilience   Sleep  Sleep:Fair   Physical Exam: Physical Exam Constitutional:       Appearance: Normal appearance.  HENT:     Head: Normocephalic and atraumatic.     Nose: Nose normal.  Eyes:     Pupils: Pupils are equal, round, and reactive to light.  Cardiovascular:     Rate and Rhythm: Regular rhythm.     Pulses: Normal pulses.  Pulmonary:     Effort: Pulmonary effort is normal.  Skin:    General: Skin is warm.  Neurological:     General: No focal deficit present.     Mental Status: She is alert and oriented to person, place, and time.    Review of Systems  Constitutional: Negative.   HENT: Negative.    Eyes: Negative.   Cardiovascular: Negative.   Gastrointestinal: Negative.   Neurological: Negative.   Psychiatric/Behavioral:  Negative for hallucinations and suicidal ideas. The patient does not have insomnia.    Blood pressure (!) 131/56, pulse 76, temperature 97.9 F (36.6 C), resp. rate 17, height 5\' 4"  (1.626 m), weight 46.7 kg, SpO2 93%. Body mass index is 17.68 kg/m.   Treatment Plan Summary: Daily contact with patient to assess and evaluate symptoms and progress in treatment and Medication management  Will continue on mirtazapine 15 mg at bedtime for depression We will discontinue olanzapine as it is not indicated in this patient, patient denies psychosis or history of bipolar disorder Social worker consulted to get collateral from family and help with a safe discharge plan.   Lewanda Rife, MD 06/18/2023, 6:02 PM

## 2023-06-18 NOTE — Group Note (Signed)
LCSW Group Therapy Note  Group Date: 06/18/2023 Start Time: 1335 End Time: 1405   Type of Therapy and Topic:  Group Therapy - Healthy vs Unhealthy Coping Skills  Participation Level:  Active   Description of Group The focus of this group was to determine what unhealthy coping techniques typically are used by group members and what healthy coping techniques would be helpful in coping with various problems. Patients were guided in becoming aware of the differences between healthy and unhealthy coping techniques. Patients were asked to identify 2-3 healthy coping skills they would like to learn to use more effectively.  Therapeutic Goals Patients learned that coping is what human beings do all day long to deal with various situations in their lives Patients defined and discussed healthy vs unhealthy coping techniques Patients identified their preferred coping techniques and identified whether these were healthy or unhealthy Patients determined 2-3 healthy coping skills they would like to become more familiar with and use more often. Patients provided support and ideas to each other   Summary of Patient Progress: During group, [name] expressed positive thoughts as a method of coping. Patient proved open to input from peers and feedback from CSW. Patient demonstrated significant insight into the subject matter, was respectful of peers, and participated throughout the entire session.   Therapeutic Modalities Cognitive Behavioral Therapy Motivational Interviewing  Whitney Post, Theresia Majors 06/18/2023  3:51 PM

## 2023-06-18 NOTE — Progress Notes (Signed)
   06/18/23 0400  Psychosocial Assessment  Eye Contact Fair  Facial Expression Grimacing;Sad  Affect Appropriate to circumstance  Speech Logical/coherent  Interaction Guarded  Motor Activity Slow  Appearance/Hygiene Unremarkable  Thought Process  Coherency WDL  Content WDL  Delusions None reported or observed  Perception WDL  Hallucination None reported or observed  Judgment WDL  Confusion None  Danger to Self  Current suicidal ideation? Denies  Agreement Not to Harm Self Yes  Description of Agreement Verbal  Danger to Others  Danger to Others None reported or observed

## 2023-06-18 NOTE — Plan of Care (Signed)

## 2023-06-18 NOTE — Group Note (Signed)
Date:  06/18/2023 Time:  10:44 AM  Group Topic/Focus:  Making Healthy Choices:   The focus of this group is to help patients identify negative/unhealthy choices they were using prior to admission and identify positive/healthier coping strategies to replace them upon discharge.    Participation Level:  Active  Participation Quality:  Appropriate  Affect:  Appropriate  Cognitive:  Appropriate  Insight: Appropriate  Engagement in Group:  Engaged  Modes of Intervention:  Education  Additional Comments:    Ardelle Anton 06/18/2023, 10:44 AM

## 2023-06-19 DIAGNOSIS — F332 Major depressive disorder, recurrent severe without psychotic features: Secondary | ICD-10-CM | POA: Diagnosis not present

## 2023-06-19 DIAGNOSIS — F1010 Alcohol abuse, uncomplicated: Secondary | ICD-10-CM | POA: Diagnosis not present

## 2023-06-19 NOTE — Progress Notes (Signed)
[facility] MD Progress Note  06/19/2023  [name]  MRN:  [id number]  Patient is a 67 year old African-American female who was involuntarily admitted to geriatric psychiatry for alcohol abuse and depression along with suicidal ideation. .  Subjective: Chart reviewed patient case discussed in multidisciplinary meeting today, patient seen during rounds.  No new acute events reported by staff.  Patient reports she feels" good".  Patient has been attending group and working on coping strategies.  Patient agrees to be in therapy upon discharge, she is still grieving her daughter's death.  Although today she denies any intention to harm herself in an attempt to join her deceased daughter.  Patient Denies Any Intention or Plan to Harm Herself or Others.  She Denies Any Psychotic or Manic Symptoms.  Principal Problem: MDD (major depressive disorder) Diagnosis: Principal Problem:   MDD (major depressive disorder) Active Problems:   MDD (major depressive disorder), recurrent severe, without psychosis (HCC)  Past Medical History:  Past Medical History:  Diagnosis Date   Allergy    Environmental   Arthritis    Asthma    Carotid artery stenosis    Carotid stenosis    COPD (chronic obstructive pulmonary disease) (HCC)    Coronary artery disease    Coronary artery disease    Hemorrhoids    Hyperlipidemia    Hypertension    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(HCC)    negative cardiac cath   Nicotine addiction    Peripheral vascular disease (HCC)    Pneumonia    Shortness of breath dyspnea    Tobacco abuse     Past Surgical History:  Procedure Laterality Date   ABDOMINAL HYSTERECTOMY     APPENDECTOMY     CARDIAC CATHETERIZATION     CARDIAC SURGERY     CHOLECYSTECTOMY     COLONOSCOPY  12/15/11   OH->bleeding internal hemorrhoids, otherwise normal   COLONOSCOPY WITH PROPOFOL N/A 04/12/2015   Procedure: COLONOSCOPY WITH PROPOFOL;  Surgeon: Midge Minium, MD;  Location: [facility] ENDOSCOPY;  Service: Endoscopy;   Laterality: N/A;   COLONOSCOPY WITH PROPOFOL N/A 01/05/2020   Procedure: COLONOSCOPY WITH PROPOFOL;  Surgeon: Midge Minium, MD;  Location: [facility] ENDOSCOPY;  Service: Endoscopy;  Laterality: N/A;   ENDARTERECTOMY FEMORAL Left 04/26/2015   Procedure: ENDARTERECTOMY FEMORAL;  Surgeon: Annice Needy, MD;  Location: [facility] ORS;  Service: Vascular;  Laterality: Left;   ENDOVASCULAR STENT INSERTION Bilateral    Legs   FLEXIBLE BRONCHOSCOPY N/A 02/02/2015   Procedure: FLEXIBLE BRONCHOSCOPY;  Surgeon: Yevonne Pax, MD;  Location: [facility] ORS;  Service: Pulmonary;  Laterality: N/A;   HEMORRHOID SURGERY     PERIPHERAL VASCULAR CATHETERIZATION Left 04/25/2015   Procedure: Lower Extremity Angiography;  Surgeon: Annice Needy, MD;  Location: [facility] INVASIVE CV LAB;  Service: Cardiovascular;  Laterality: Left;   PERIPHERAL VASCULAR CATHETERIZATION Left 04/25/2015   Procedure: Lower Extremity Intervention;  Surgeon: Annice Needy, MD;  Location: [facility] INVASIVE CV LAB;  Service: Cardiovascular;  Laterality: Left;   PERIPHERAL VASCULAR CATHETERIZATION Left 01/02/2016   Procedure: Lower Extremity Angiography;  Surgeon: Annice Needy, MD;  Location: [facility] INVASIVE CV LAB;  Service: Cardiovascular;  Laterality: Left;   PERIPHERAL VASCULAR CATHETERIZATION  01/02/2016   Procedure: Lower Extremity Intervention;  Surgeon: Annice Needy, MD;  Location: [facility] INVASIVE CV LAB;  Service: Cardiovascular;;   THROMBECTOMY FEMORAL ARTERY Left 04/26/2015   Procedure: THROMBECTOMY FEMORAL ARTERY;  Surgeon: Annice Needy, MD;  Location: [facility] ORS;  Service: Vascular;  Laterality: Left;   Family History:  Family History  Problem Relation Age of Onset   Hypertension Mother    Hypertension Father     Social History:  Social History   Substance and Sexual Activity  Alcohol Use Yes   Alcohol/week: 0.0 standard drinks of alcohol   Comment: weekends, couple beers, pint of liquer only on weekends.  Last drank approx 1 month ago.     Social History    Substance and Sexual Activity  Drug Use No    Social History   Socioeconomic History   Marital status: Single    Spouse name: Not on file   Number of children: Not on file   Years of education: Not on file   Highest education level: Not on file  Occupational History   Not on file  Tobacco Use   Smoking status: Every Day    Current packs/day: 1.00    Average packs/day: 1 pack/day for 41.0 years (41.0 ttl pk-yrs)    Types: Cigarettes   Smokeless tobacco: Never   Tobacco comments:    Less than a 1/2 pack daily  Vaping Use   Vaping status: Never Used  Substance and Sexual Activity   Alcohol use: Yes    Alcohol/week: 0.0 standard drinks of alcohol    Comment: weekends, couple beers, pint of liquer only on weekends.  Last drank approx 1 month ago.   Drug use: No   Sexual activity: Not Currently  Other Topics Concern   Not on file  Social History Narrative   Lives with family    Social Determinants of Health   Financial Resource Strain: Low Risk  (03/21/2023)   Received from [facility]   Overall Financial Resource Strain (CARDIA)    Difficulty of Paying Living Expenses: Not hard at all  Food Insecurity: No Food Insecurity (06/12/2023)   Hunger Vital Sign    Worried About Running Out of Food in the Last Year: Never true    Ran Out of Food in the Last Year: Never true  Transportation Needs: No Transportation Needs (06/12/2023)   PRAPARE - Administrator, Civil Service (Medical): No    Lack of Transportation (Non-Medical): No  Physical Activity: Inactive (04/04/2018)   Exercise Vital Sign    Days of Exercise per Week: 0 days    Minutes of Exercise per Session: 0 min  Stress: No Stress Concern Present (04/04/2018)   [employer] of Occupational Health - Occupational Stress Questionnaire    Feeling of Stress : Only a little  Social Connections: Moderately Isolated (04/04/2018)   Social Connection and Isolation Panel [NHANES]    Frequency of Communication  with Friends and Family: Once a week    Frequency of Social Gatherings with Friends and Family: Once a week    Attends Religious Services: 1 to 4 times per year    Active Member of [employer] or Organizations: No    Attends Banker Meetings: Never    Marital Status: Never married   Additional Social History:                         Sleep: Fair  Appetite:  Fair  Current Medications: Current Facility-Administered Medications  Medication Dose Route Frequency Provider Last Rate Last Admin   acetaminophen (TYLENOL) tablet 650 mg  650 mg Oral Q6H PRN Lauree Chandler, NP   650 mg at 06/19/23 1029   albuterol (VENTOLIN HFA) 108 (90 Base) MCG/ACT inhaler 2 puff  2 puff  Inhalation Q6H PRN Lauree Chandler, NP       alum & mag hydroxide-simeth (MAALOX/MYLANTA) 200-200-20 MG/5ML suspension 30 mL  30 mL Oral Q4H PRN Lauree Chandler, NP       ezetimibe (ZETIA) tablet 10 mg  10 mg Oral Daily Sarina Ill, DO   10 mg at 06/19/23 1610   folic acid (FOLVITE) tablet 1 mg  1 mg Oral Daily Sarina Ill, DO   1 mg at 06/19/23 9604   gabapentin (NEURONTIN) capsule 100 mg  100 mg Oral TID Sarina Ill, DO   100 mg at 06/19/23 0858   losartan (COZAAR) tablet 50 mg  50 mg Oral QPC breakfast Sarina Ill, DO   50 mg at 06/19/23 5409   magnesium hydroxide (MILK OF MAGNESIA) suspension 30 mL  30 mL Oral Daily PRN Lauree Chandler, NP       mirtazapine (REMERON) tablet 15 mg  15 mg Oral QHS Sarina Ill, DO   15 mg at 06/18/23 2123   multivitamin with minerals tablet 1 tablet  1 tablet Oral Daily Sarina Ill, DO   1 tablet at 06/19/23 8119   nicotine (NICODERM CQ - dosed in mg/24 hours) patch 14 mg  14 mg Transdermal Daily Sarina Ill, DO   14 mg at 06/19/23 0858   OLANZapine (ZYPREXA) tablet 5 mg  5 mg Oral Q6H PRN Sarina Ill, DO   5 mg at 06/18/23 2121   oxyCODONE (Oxy IR/ROXICODONE) immediate  release tablet 10 mg  10 mg Oral Q6H PRN Sarina Ill, DO   10 mg at 06/18/23 2122   thiamine (VITAMIN B1) tablet 100 mg  100 mg Oral Daily Sarina Ill, DO   100 mg at 06/19/23 1478   traZODone (DESYREL) tablet 50 mg  50 mg Oral QHS PRN Lauree Chandler, NP   50 mg at 06/12/23 2031    Lab Results:  Results for orders placed or performed during the hospital encounter of 06/12/23 (from the past 48 hour(s))  Hemoglobin A1c     Status: None   Collection Time: 06/18/23  7:07 AM  Result Value Ref Range   Hgb A1c MFr Bld 5.5 4.8 - 5.6 %    Comment: (NOTE) Pre diabetes:          5.7%-6.4%  Diabetes:              >6.4%  Glycemic control for   <7.0% adults with diabetes    Mean Plasma Glucose 111.15 mg/dL    Comment: Performed at [facility], [address] [place], [place] [address]  Lipid panel     Status: Abnormal   Collection Time: 06/18/23  7:07 AM  Result Value Ref Range   Cholesterol 202 (H) 0 - 200 mg/dL   Triglycerides 130 (H) <150 mg/dL   HDL 61 >86 mg/dL   Total CHOL/HDL Ratio 3.3 RATIO   VLDL 37 0 - 40 mg/dL   LDL Cholesterol 578 (H) 0 - 99 mg/dL    Comment:        Total Cholesterol/HDL:CHD Risk Coronary Heart Disease Risk Table                     Men   Women  1/2 Average Risk   3.4   3.3  Average Risk       5.0   4.4  2 X Average Risk   9.6  7.1  3 X Average Risk  23.4   11.0        Use the calculated Patient Ratio above and the CHD Risk Table to determine the patient's CHD Risk.        ATP III CLASSIFICATION (LDL):  <100     mg/dL   Optimal  621-308  mg/dL   Near or Above                    Optimal  130-159  mg/dL   Borderline  657-846  mg/dL   High  >962     mg/dL   Very High Performed at [facility], [address] [place], [place] [address]     Blood Alcohol level:  Lab Results  Component Value Date   ETH [facility]) 06/11/2023   ETH 82 (H) 09/24/2021    Metabolic Disorder Labs: Lab Results  Component  Value Date   HGBA1C 5.5 06/18/2023   MPG 111.15 06/18/2023   MPG 120 08/05/2021   No results found for: "PROLACTIN" Lab Results  Component Value Date   CHOL 202 (H) 06/18/2023   TRIG 186 (H) 06/18/2023   HDL 61 06/18/2023   CHOLHDL 3.3 06/18/2023   VLDL 37 06/18/2023   LDLCALC 104 (H) 06/18/2023   LDLCALC 55 05/28/2022    Physical Findings: AIMS:  , ,  ,  ,    CIWA:  CIWA-Ar Total: 0 COWS:     Musculoskeletal: Strength & Muscle Tone: within normal limits Gait & Station: normal Patient leans: N/A  Psychiatric Specialty Exam:  Presentation  General Appearance:  Appropriate  Eye Contact: Good  Speech: Spontaneous  Speech Volume: Normal  Handedness: Right   Mood and Affect  Mood: "Good"  Affect: Stable, animated,   Thought Process  Thought Processes: Linear  Descriptions of Associations:Intact  Orientation:Full (Time, Place and Person)  Thought Content:Logical  History of Schizophrenia/Schizoaffective disorder:No  Duration of Psychotic Symptoms:NA Hallucinations:Denies Ideas of Reference:None  Suicidal Thoughts:Denies Homicidal Thoughts:Denies  Sensorium  Memory: Fair  Judgment: Improved  Insight: Improved   Executive Functions  Concentration: Fair  Attention Span: Fair  Recall: [employer] of Knowledge: Fair  Language: Fair   Psychomotor Activity  Psychomotor Activity:Normal  Assets  Assets: Desire for Improvement; Communication Skills; Resilience   Sleep  Sleep:Fair   Physical Exam: Physical Exam Constitutional:      Appearance: Normal appearance.  HENT:     Head: Normocephalic and atraumatic.     Nose: Nose normal.  Eyes:     Pupils: Pupils are equal, round, and reactive to light.  Cardiovascular:     Rate and Rhythm: Regular rhythm.     Pulses: Normal pulses.  Pulmonary:     Effort: Pulmonary effort is normal.  Skin:    General: Skin is warm.  Neurological:     General: No focal deficit  present.     Mental Status: She is alert and oriented to person, place, and time.    Review of Systems  Constitutional: Negative.   HENT: Negative.    Eyes: Negative.   Cardiovascular: Negative.   Gastrointestinal: Negative.   Neurological: Negative.   Psychiatric/Behavioral:  Negative for hallucinations and suicidal ideas. The patient does not have insomnia.    Blood pressure 118/64, pulse 76, temperature (!) 97.5 F (36.4 C), resp. rate 16, height 5\' 4"  (1.626 m), weight 46.7 kg, SpO2 98%. Body mass index is 17.68 kg/m.   Treatment Plan Summary: Daily contact with  patient to assess and evaluate symptoms and progress in treatment and Medication management  Will continue on mirtazapine 15 mg at bedtime for depression We will discontinue olanzapine as it is not indicated in this patient, patient denies psychosis or history of bipolar disorder Social worker consulted to get collateral from family and help with a safe discharge plan.   Lewanda Rife, MD

## 2023-06-19 NOTE — Group Note (Signed)
Recreation Therapy Group Note   Group Topic:Goal Setting  Group Date: 06/19/2023 Start Time: 1400 End Time: 1500 Facilitators: Rosina Lowenstein, LRT, CTRS Location:  Dayroom  Group Description: Scientist, research (physical sciences). Patients were given many different magazines, a glue stick, markers, and a piece of cardstock paper. LRT and pts discussed the importance of having goals in life. LRT and pts discussed the difference between short-term and long-term goals, as well as what a SMART goal is. LRT encouraged pts to create a vision board, with images they picked and then cut out with safety scissors from the magazine, for themselves, that capture their short and long-term goals. LRT encouraged pts to show and explain their vision board to the group.   Goal Area(s) Addressed:  Patient will gain knowledge of short vs. long term goals.  Patient will identify goals for themselves. Patient will practice setting SMART goals. Patient will verbalize their goals to LRT and peers.   Affect/Mood: Appropriate   Participation Level: Active and Engaged   Participation Quality: Independent   Behavior: Calm and Cooperative   Speech/Thought Process: Coherent   Insight: Good   Judgement: Good   Modes of Intervention: Art   Patient Response to Interventions:  Attentive, Engaged, Interested , and Receptive   Education Outcome:  Acknowledges education   Clinical Observations/Individualized Feedback: Clydell was active in their participation of session activities and group discussion. Pt identified "I want to get back into cooking, learn to say no, and to get things together" as her goals. Pt interacted well with LRT and peers duration of session.   Plan: Continue to engage patient in RT group sessions 2-3x/week.   Rosina Lowenstein, LRT, CTRS 06/19/2023 3:35 PM

## 2023-06-19 NOTE — Progress Notes (Signed)
   06/19/23 2200  Psych Admission Type (Psych Patients Only)  Admission Status Involuntary  Psychosocial Assessment  Patient Complaints Depression;Anxiety  Eye Contact Fair  Facial Expression Anxious  Affect Preoccupied;Anxious  Speech Logical/coherent  Interaction Minimal  Motor Activity Slow  Appearance/Hygiene Unremarkable  Behavior Characteristics Cooperative  Mood Anxious;Pleasant  Thought Process  Coherency WDL  Content WDL  Delusions None reported or observed  Perception WDL  Hallucination None reported or observed  Judgment WDL  Confusion None  Danger to Self  Current suicidal ideation? Denies  Agreement Not to Harm Self Yes  Description of Agreement verbal  Danger to Others  Danger to Others None reported or observed

## 2023-06-19 NOTE — Group Note (Signed)
Date:  06/19/2023 Time:  5:56 PM  Group Topic/Focus:  Making Healthy Choices:   The focus of this group is to help patients identify negative/unhealthy choices they were using prior to admission and identify positive/healthier coping strategies to replace them upon discharge.    Participation Level:  Active  Participation Quality:  Appropriate  Affect:  Appropriate  Cognitive:  Appropriate  Insight: Appropriate  Engagement in Group:  Engaged  Modes of Intervention:  Education  Additional Comments:    Ardelle Anton 06/19/2023, 5:56 PM

## 2023-06-19 NOTE — Progress Notes (Signed)
   06/19/23 1151  Psych Admission Type (Psych Patients Only)  Admission Status Involuntary  Psychosocial Assessment  Patient Complaints Anxiety;Worrying  Eye Contact Fair  Facial Expression Anxious  Affect Preoccupied;Anxious  Speech Logical/coherent  Interaction Needy  Motor Activity Slow  Appearance/Hygiene Unremarkable  Behavior Characteristics Cooperative  Mood Pleasant;Anxious  Thought Process  Coherency WDL  Content WDL  Delusions None reported or observed  Perception Depersonalization  Hallucination None reported or observed  Judgment WDL  Confusion None  Danger to Self  Current suicidal ideation? Denies  Danger to Others  Danger to Others None reported or observed

## 2023-06-19 NOTE — Plan of Care (Signed)

## 2023-06-19 NOTE — Progress Notes (Signed)
   06/19/23 1500  Spiritual Encounters  Type of Visit Initial  Care provided to: Patient  Conversation partners present during encounter Social worker/Care management/TOC  Referral source Social worker/Care management/TOC  Reason for visit Routine spiritual support  OnCall Visit Yes  Spiritual Framework  Presenting Themes Impactful experiences and emotions;Courage hope and growth  Patient Stress Factors None identified   Social worker asked chaplain to talk with patient before she leaves. Chaplain met with patient in the dayroom and the patient was in a very good mood and reading a romance novel. Patient shared with the chaplain what the book was about and told me that she is going home tomorrow. Patient stated that she is looking forward to going home to see her 57 yr old grandson. She said she misses him a lot. Chaplain offered the patient words of hope and encouragement.

## 2023-06-19 NOTE — BH IP Treatment Plan (Signed)
Interdisciplinary Treatment and Diagnostic Plan Update  06/19/2023 Time of Session: 9:30 AM  [name] MRN: [id number]  Principal Diagnosis: MDD (major depressive disorder)  Secondary Diagnoses: Principal Problem:   MDD (major depressive disorder) Active Problems:   MDD (major depressive disorder), recurrent severe, without psychosis (HCC)   Current Medications:  Current Facility-Administered Medications  Medication Dose Route Frequency Provider Last Rate Last Admin   acetaminophen (TYLENOL) tablet 650 mg  650 mg Oral Q6H PRN Lauree Chandler, NP   650 mg at 06/19/23 1029   albuterol (VENTOLIN HFA) 108 (90 Base) MCG/ACT inhaler 2 puff  2 puff Inhalation Q6H PRN Lauree Chandler, NP       alum & mag hydroxide-simeth (MAALOX/MYLANTA) 200-200-20 MG/5ML suspension 30 mL  30 mL Oral Q4H PRN Lauree Chandler, NP       ezetimibe (ZETIA) tablet 10 mg  10 mg Oral Daily Sarina Ill, DO   10 mg at 06/19/23 3295   folic acid (FOLVITE) tablet 1 mg  1 mg Oral Daily Sarina Ill, DO   1 mg at 06/19/23 1884   gabapentin (NEURONTIN) capsule 100 mg  100 mg Oral TID Sarina Ill, DO   100 mg at 06/19/23 0858   losartan (COZAAR) tablet 50 mg  50 mg Oral QPC breakfast Sarina Ill, DO   50 mg at 06/19/23 0858   magnesium hydroxide (MILK OF MAGNESIA) suspension 30 mL  30 mL Oral Daily PRN Lauree Chandler, NP       mirtazapine (REMERON) tablet 15 mg  15 mg Oral QHS Sarina Ill, DO   15 mg at 06/18/23 2123   multivitamin with minerals tablet 1 tablet  1 tablet Oral Daily Sarina Ill, DO   1 tablet at 06/19/23 0858   nicotine (NICODERM CQ - dosed in mg/24 hours) patch 14 mg  14 mg Transdermal Daily Sarina Ill, DO   14 mg at 06/19/23 0858   OLANZapine (ZYPREXA) tablet 5 mg  5 mg Oral Q6H PRN Sarina Ill, DO   5 mg at 06/18/23 2121   oxyCODONE (Oxy IR/ROXICODONE) immediate release tablet 10 mg  10 mg  Oral Q6H PRN Sarina Ill, DO   10 mg at 06/18/23 2122   thiamine (VITAMIN B1) tablet 100 mg  100 mg Oral Daily Sarina Ill, DO   100 mg at 06/19/23 0858   traZODone (DESYREL) tablet 50 mg  50 mg Oral QHS PRN Lauree Chandler, NP   50 mg at 06/12/23 2031   PTA Medications: Medications Prior to Admission  Medication Sig Dispense Refill Last Dose   albuterol (VENTOLIN HFA) 108 (90 Base) MCG/ACT inhaler INHALE 2 PUFFS BY MOUTH EVERY 6 HOURS AS NEEDED FOR WHEEZING FOR SHORTNESS OF BREATH 9 g 0    aspirin EC 81 MG EC tablet Take 1 tablet (81 mg total) by mouth daily. 90 tablet 1    atorvastatin (LIPITOR) 80 MG tablet Take 1 tablet (80 mg total) by mouth daily. 90 tablet 3    budesonide-formoterol (SYMBICORT) 80-4.5 MCG/ACT inhaler INHALE 2 PUFFS BY MOUTH ONCE DAILY 22 g 2    butalbital-acetaminophen-caffeine (FIORICET) 50-325-40 MG tablet Take 1 tablet by mouth every 6 (six) hours as needed for headache. (Patient not taking: Reported on 06/12/2023) 14 tablet 4    cholecalciferol (VITAMIN D) 400 units TABS tablet Take 400 Units by mouth.      ezetimibe (ZETIA) 10 MG tablet Take 1 tablet (10 mg  total) by mouth daily. 90 tablet 3    ferrous sulfate 325 (65 FE) MG tablet Take 325 mg by mouth daily.      losartan (COZAAR) 50 MG tablet Take 2 tablets (100 mg total) by mouth daily. 180 tablet 3    metoprolol succinate (TOPROL-XL) 50 MG 24 hr tablet TAKE 1 TABLET BY MOUTH ONCE DAILY WITH  OR  IMMEDIATELY  FOLLOWING  A  MEAL (Patient taking differently: Take 50 mg by mouth daily. WITH  OR  IMMEDIATELY  FOLLOWING  A  MEAL) 90 tablet 0    mirtazapine (REMERON) 15 MG tablet Take 1 tablet (15 mg total) by mouth at bedtime. 30 tablet 3    oxyCODONE (OXY IR/ROXICODONE) 5 MG immediate release tablet Take 0.5 tablets (2.5 mg total) by mouth every 6 (six) hours as needed for moderate pain or severe pain. 30 tablet 0    oxyCODONE-acetaminophen (PERCOCET) 5-325 MG tablet Take 1 tablet by mouth  every 6 (six) hours as needed for severe pain. 12 tablet 0     Patient Stressors:    Patient Strengths:    Treatment Modalities: Medication Management, Group therapy, Case management,  1 to 1 session with clinician, Psychoeducation, Recreational therapy.   Physician Treatment Plan for Primary Diagnosis: MDD (major depressive disorder) Long Term Goal(s): Improvement in symptoms so as ready for discharge   Short Term Goals: Ability to identify changes in lifestyle to reduce recurrence of condition will improve Ability to verbalize feelings will improve Ability to disclose and discuss suicidal ideas Ability to demonstrate self-control will improve Ability to identify and develop effective coping behaviors will improve Ability to maintain clinical measurements within normal limits will improve Compliance with prescribed medications will improve Ability to identify triggers associated with substance abuse/mental health issues will improve  Medication Management: Evaluate patient's response, side effects, and tolerance of medication regimen.  Therapeutic Interventions: 1 to 1 sessions, Unit Group sessions and Medication administration.  Evaluation of Outcomes: Progressing  Physician Treatment Plan for Secondary Diagnosis: Principal Problem:   MDD (major depressive disorder) Active Problems:   MDD (major depressive disorder), recurrent severe, without psychosis (HCC)  Long Term Goal(s): Improvement in symptoms so as ready for discharge   Short Term Goals: Ability to identify changes in lifestyle to reduce recurrence of condition will improve Ability to verbalize feelings will improve Ability to disclose and discuss suicidal ideas Ability to demonstrate self-control will improve Ability to identify and develop effective coping behaviors will improve Ability to maintain clinical measurements within normal limits will improve Compliance with prescribed medications will  improve Ability to identify triggers associated with substance abuse/mental health issues will improve     Medication Management: Evaluate patient's response, side effects, and tolerance of medication regimen.  Therapeutic Interventions: 1 to 1 sessions, Unit Group sessions and Medication administration.  Evaluation of Outcomes: Progressing   RN Treatment Plan for Primary Diagnosis: MDD (major depressive disorder) Long Term Goal(s): Knowledge of disease and therapeutic regimen to maintain health will improve  Short Term Goals: Ability to remain free from injury will improve, Ability to verbalize frustration and anger appropriately will improve, Ability to demonstrate self-control, Ability to participate in decision making will improve, Ability to verbalize feelings will improve, Ability to disclose and discuss suicidal ideas, Ability to identify and develop effective coping behaviors will improve, and Compliance with prescribed medications will improve  Medication Management: RN will administer medications as ordered by provider, will assess and evaluate patient's response and provide education to patient  for prescribed medication. RN will report any adverse and/or side effects to prescribing provider.  Therapeutic Interventions: 1 on 1 counseling sessions, Psychoeducation, Medication administration, Evaluate responses to treatment, Monitor vital signs and CBGs as ordered, Perform/monitor CIWA, COWS, AIMS and Fall Risk screenings as ordered, Perform wound care treatments as ordered.  Evaluation of Outcomes: Progressing   LCSW Treatment Plan for Primary Diagnosis: MDD (major depressive disorder) Long Term Goal(s): Safe transition to appropriate next level of care at discharge, Engage patient in therapeutic group addressing interpersonal concerns.  Short Term Goals: Engage patient in aftercare planning with referrals and resources, Increase social support, Increase ability to appropriately  verbalize feelings, Increase emotional regulation, Facilitate acceptance of mental health diagnosis and concerns, Facilitate patient progression through stages of change regarding substance use diagnoses and concerns, Identify triggers associated with mental health/substance abuse issues, and Increase skills for wellness and recovery  Therapeutic Interventions: Assess for all discharge needs, 1 to 1 time with Social worker, Explore available resources and support systems, Assess for adequacy in community support network, Educate family and significant other(s) on suicide prevention, Complete Psychosocial Assessment, Interpersonal group therapy.  Evaluation of Outcomes: Progressing   Progress in Treatment: Attending groups: Yes. and No. Participating in groups: Yes. and No. Taking medication as prescribed: Yes. Toleration medication: Yes. Family/Significant other contact made: No, will contact:  CSW will contact if given permission  Patient understands diagnosis: Yes. Discussing patient identified problems/goals with staff: Yes. Medical problems stabilized or resolved: Yes. Denies suicidal/homicidal ideation: Yes. Issues/concerns per patient self-inventory: No. Other: None  New problem(s) identified: No, Describe:  None identified Update 06/19/23: No changes at this time    New Short Term/Long Term Goal(s): detox, elimination of symptoms of psychosis, medication management for mood stabilization; elimination of SI thoughts; development of comprehensive mental wellness/sobriety plan.    Patient Goals:  "Get my nerves together so I can go home and get out" Update 06/19/23: No changes at this time    Discharge Plan or Barriers: CSW will assist with appropriate discharge planning Update 06/19/23: No changes at this time    Reason for Continuation of Hospitalization: Depression Medication stabilization   Estimated Length of Stay: 1 to 7 days Update 06/19/23: TBD   Last 3 [place] Suicide  Severity Risk Score: Flowsheet Row Admission (Current) from 06/12/2023 in [facility] ED from 06/11/2023 in [facility] Department at [facility] from 05/05/2023 in [facility] Department at [facility]  C-SSRS RISK CATEGORY No Risk High Risk No Risk       Last PHQ 2/9 Scores:    05/31/2023   10:26 AM 05/16/2023   10:12 AM 05/25/2022   10:21 AM  Depression screen PHQ 2/9  Decreased Interest 1 3 3   Down, Depressed, Hopeless 0 3 3  PHQ - 2 Score 1 6 6   Altered sleeping  1 3  Tired, decreased energy  1 0  Change in appetite  1 3  Feeling bad or failure about yourself   1 3  Trouble concentrating  1 0  Moving slowly or fidgety/restless  0 0  Suicidal thoughts  0 0  PHQ-9 Score  11 15  Difficult doing work/chores   Not difficult at all    Scribe for Treatment Team: Elza Rafter, LCSWA 06/19/2023 12:07 PM

## 2023-06-19 NOTE — Progress Notes (Signed)
Pt. Observed sitting in the day room, reading a book and interacting appropriately with staff and peers. Patient denies SI/HI/AVH and anxiety. Patient C/O of (R) upper quad pain and received PRN Oxy 10 mg PO for pain, medication effective. Pt. Med compliant. VSS, no acute distress noted. Routine observations to monitor pt safety to continue.

## 2023-06-20 DIAGNOSIS — F332 Major depressive disorder, recurrent severe without psychotic features: Secondary | ICD-10-CM | POA: Diagnosis not present

## 2023-06-20 DIAGNOSIS — F1010 Alcohol abuse, uncomplicated: Secondary | ICD-10-CM | POA: Diagnosis not present

## 2023-06-20 MED ORDER — FOLIC ACID 1 MG PO TABS: 1.0000 mg | ORAL_TABLET | Freq: Every day | ORAL | 0 refills | Status: DC

## 2023-06-20 MED ORDER — GABAPENTIN 100 MG PO CAPS: 100.0000 mg | ORAL_CAPSULE | Freq: Three times a day (TID) | ORAL | 0 refills | Status: DC

## 2023-06-20 MED ORDER — ADULT MULTIVITAMIN W/MINERALS CH: 1.0000 | ORAL_TABLET | Freq: Every day | ORAL | 0 refills | Status: DC

## 2023-06-20 MED ORDER — VITAMIN B-1 100 MG PO TABS: 100.0000 mg | ORAL_TABLET | Freq: Every day | ORAL | 0 refills | Status: DC

## 2023-06-20 MED ORDER — NICOTINE 14 MG/24HR TD PT24: 14.0000 mg | MEDICATED_PATCH | Freq: Every day | TRANSDERMAL | 0 refills | Status: DC

## 2023-06-20 MED ORDER — OXYCODONE HCL 10 MG PO TABS: 10.0000 mg | ORAL_TABLET | Freq: Four times a day (QID) | ORAL | 0 refills | Status: DC | PRN

## 2023-06-20 NOTE — Plan of Care (Signed)
D: Pt alert and oriented. Pt denies experiencing any anxiety or depression at this time however was observed crying in the dayroom for a moment. Pt denies experiencing any pain at this time. Pt denies experiencing any SI/HI, or AVH at this time.   A: Scheduled medications administered to pt, per MD orders. Support and encouragement provided. Frequent verbal contact made. Routine safety checks conducted q15 minutes.   R: No adverse drug reactions noted. Pt verbally contracts for safety at this time. Pt compliant with medications and treatment plan. Pt interacts well with others on the unit. Pt remains safe at this time. Plan of care ongoing.  Problem: Coping: Goal: Level of anxiety will decrease Outcome: Progressing   Problem: Pain Managment: Goal: General experience of comfort will improve Outcome: Progressing

## 2023-06-20 NOTE — Care Management Important Message (Signed)
Important Message  Patient Details  Name: [name] MRN: [id number] Date of Birth: 1956/06/15   Important Message Given:  Yes - Medicare IM     Elza Rafter, LCSWA 06/20/2023, 12:16 PM

## 2023-06-20 NOTE — Progress Notes (Signed)
   06/20/23 0600  15 Minute Checks  Location Bedroom  Visual Appearance Calm  Behavior Sleeping  Sleep (Behavioral Health Patients Only)  Calculate sleep? (Click Yes once per 24 hr at 0600 safety check) Yes  Documented sleep last 24 hours 9

## 2023-06-20 NOTE — Group Note (Signed)
Date:  06/20/2023 Time:  4:58 AM  Group Topic/Focus:  Coping With Mental Health Crisis:   The purpose of this group is to help patients identify strategies for coping with mental health crisis.  Group discusses possible causes of crisis and ways to manage them effectively. Wrap-Up Group:   The focus of this group is to help patients review their daily goal of treatment and discuss progress on daily workbooks.    Participation Level:  Active  Participation Quality:  Appropriate  Affect:  Appropriate  Cognitive:  Appropriate  Insight: Appropriate  Engagement in Group:  Engaged  Modes of Intervention:  Discussion  Additional Comments:    Osker Mason 06/20/2023, 4:58 AM

## 2023-06-20 NOTE — Discharge Summary (Signed)
Physician Discharge Summary Note  Patient:  [name] is an 67 y.o., female MRN:  [id number] DOB:  Nov 28, 1955 Patient phone:  [phone] (home)  Patient address:   [address] [place] [address],    Date of Admission:  06/12/2023 Date of Discharge: 06/20/2023  Reason for Admission:   [name] is a 67 year old African-American female who was involuntarily admitted to geriatric psychiatry for alcohol abuse and depression along with suicidal ideation.   Principal Problem: MDD (major depressive disorder) Discharge Diagnoses: Principal Problem:   MDD (major depressive disorder) Active Problems:   MDD (major depressive disorder), recurrent severe, without psychosis (HCC)   Past Psychiatric History: Patient reports past history of depression  Past Medical History:  Past Medical History:  Diagnosis Date   Allergy    Environmental   Arthritis    Asthma    Carotid artery stenosis    Carotid stenosis    COPD (chronic obstructive pulmonary disease) (HCC)    Coronary artery disease    Coronary artery disease    Hemorrhoids    Hyperlipidemia    Hypertension    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(HCC)    negative cardiac cath   Nicotine addiction    Peripheral vascular disease (HCC)    Pneumonia    Shortness of breath dyspnea    Tobacco abuse     Past Surgical History:  Procedure Laterality Date   ABDOMINAL HYSTERECTOMY     APPENDECTOMY     CARDIAC CATHETERIZATION     CARDIAC SURGERY     CHOLECYSTECTOMY     COLONOSCOPY  12/15/11   OH->bleeding internal hemorrhoids, otherwise normal   COLONOSCOPY WITH PROPOFOL N/A 04/12/2015   Procedure: COLONOSCOPY WITH PROPOFOL;  Surgeon: Midge Minium, MD;  Location: [facility] ENDOSCOPY;  Service: Endoscopy;  Laterality: N/A;   COLONOSCOPY WITH PROPOFOL N/A 01/05/2020   Procedure: COLONOSCOPY WITH PROPOFOL;  Surgeon: Midge Minium, MD;  Location: [facility] ENDOSCOPY;  Service: Endoscopy;  Laterality: N/A;   ENDARTERECTOMY FEMORAL Left 04/26/2015    Procedure: ENDARTERECTOMY FEMORAL;  Surgeon: Annice Needy, MD;  Location: [facility] ORS;  Service: Vascular;  Laterality: Left;   ENDOVASCULAR STENT INSERTION Bilateral    Legs   FLEXIBLE BRONCHOSCOPY N/A 02/02/2015   Procedure: FLEXIBLE BRONCHOSCOPY;  Surgeon: Yevonne Pax, MD;  Location: [facility] ORS;  Service: Pulmonary;  Laterality: N/A;   HEMORRHOID SURGERY     PERIPHERAL VASCULAR CATHETERIZATION Left 04/25/2015   Procedure: Lower Extremity Angiography;  Surgeon: Annice Needy, MD;  Location: [facility] INVASIVE CV LAB;  Service: Cardiovascular;  Laterality: Left;   PERIPHERAL VASCULAR CATHETERIZATION Left 04/25/2015   Procedure: Lower Extremity Intervention;  Surgeon: Annice Needy, MD;  Location: [facility] INVASIVE CV LAB;  Service: Cardiovascular;  Laterality: Left;   PERIPHERAL VASCULAR CATHETERIZATION Left 01/02/2016   Procedure: Lower Extremity Angiography;  Surgeon: Annice Needy, MD;  Location: [facility] INVASIVE CV LAB;  Service: Cardiovascular;  Laterality: Left;   PERIPHERAL VASCULAR CATHETERIZATION  01/02/2016   Procedure: Lower Extremity Intervention;  Surgeon: Annice Needy, MD;  Location: [facility] INVASIVE CV LAB;  Service: Cardiovascular;;   THROMBECTOMY FEMORAL ARTERY Left 04/26/2015   Procedure: THROMBECTOMY FEMORAL ARTERY;  Surgeon: Annice Needy, MD;  Location: [facility] ORS;  Service: Vascular;  Laterality: Left;   Family History:  Family History  Problem Relation Age of Onset   Hypertension Mother    Hypertension Father     Social History:  Social History   Substance and Sexual Activity  Alcohol Use Yes   Alcohol/week: 0.0  standard drinks of alcohol   Comment: weekends, couple beers, pint of liquer only on weekends.  Last drank approx 1 month ago.     Social History   Substance and Sexual Activity  Drug Use No    Social History   Socioeconomic History   Marital status: Single    Spouse name: Not on file   Number of children: Not on file   Years of education: Not on file   Highest education level:  Not on file  Occupational History   Not on file  Tobacco Use   Smoking status: Every Day    Current packs/day: 1.00    Average packs/day: 1 pack/day for 41.0 years (41.0 ttl pk-yrs)    Types: Cigarettes   Smokeless tobacco: Never   Tobacco comments:    Less than a 1/2 pack daily  Vaping Use   Vaping status: Never Used  Substance and Sexual Activity   Alcohol use: Yes    Alcohol/week: 0.0 standard drinks of alcohol    Comment: weekends, couple beers, pint of liquer only on weekends.  Last drank approx 1 month ago.   Drug use: No   Sexual activity: Not Currently  Other Topics Concern   Not on file  Social History Narrative   Lives with family    Social Determinants of Health   Financial Resource Strain: Low Risk  (03/21/2023)   Received [facility]   Overall Financial Resource Strain (CARDIA)    Difficulty of Paying Living Expenses: Not hard at all  Food Insecurity: No Food Insecurity (06/12/2023)   Hunger Vital Sign    Worried About Running Out of Food in the Last Year: Never true    Ran Out of Food in the Last Year: Never true  Transportation Needs: No Transportation Needs (06/12/2023)   PRAPARE - Administrator, Civil Service (Medical): No    Lack of Transportation (Non-Medical): No  Physical Activity: Inactive (04/04/2018)   Exercise Vital Sign    Days of Exercise per Week: 0 days    Minutes of Exercise per Session: 0 min  Stress: No Stress Concern Present (04/04/2018)   [employer] of Occupational Health - Occupational Stress Questionnaire    Feeling of Stress : Only a little  Social Connections: Moderately Isolated (04/04/2018)   Social Connection and Isolation Panel [NHANES]    Frequency of Communication with Friends and Family: Once a week    Frequency of Social Gatherings with Friends and Family: Once a week    Attends Religious Services: 1 to 4 times per year    Active Member of [employer] or Organizations: No    Attends Banker  Meetings: Never    Marital Status: Never married    Hospital Course:  The patient was admitted to Inpatient psychiatric treatment for stabilization of depression and suicidal thoughts.  Patient was placed on suicidal precautions. The patient was evaluated and treated by the multidisciplinary treatment team including physicians, nurses, social workers and therapists. All medications were presented to the patient and the Patient gave consent to all the medications that they were given, as well as was explained the risks, benefits, side effects and alternatives of all medication therapies. The patient was integrated into the general milieu on the ward and encouraged to attend to her ADLs and participate in all groups and activities. During hospital course the Patient attended coping skill groups, music therapy and activity therapy groups. Patient was counseled on cognitive techniques/skills by multiple  staff members and given support care by the staff.  Patient's medication regimen was evaluated and titrated to therapeutic levels to better Patient's overall daily functioning. Specifically, the patient was started on CIWA protocol to detox from alcohol.  Patient was continued on Remeron 15 mg at bedtime for depression.  She tolerated the medication well with no significant side effects. Patient shared that prior to admission she was trying to drink herself to death.  Patient's alcohol level at admission was in 400s.  Patient said that attending groups and being on unit had made her realize that she has other family members including her daughter, son, and grand kids to live for.   During the hospitalization, the patient demonstrated a stabilization of mood with decreased racing thoughts, decreased impulsivity, improved sleep and decreased irritability. At the time of discharge, the patient denied any suicidal ideation/homicidal ideation and was not overtly depressed, manic or psychotic. The Patient was  interacting well in groups and on the unit with their peers. Patient was able to identify a safety plan to include speaking with family, contacting outpatient provider or calling 911 if hallucinations/delusions returned or worsened or thoughts of self-harm or suicide return. Patient was counselled on outpatient follow-up that was arranged prior to discharge.    Musculoskeletal: Strength & Muscle Tone: within normal limits Gait & Station: normal Patient leans: N/A   Psychiatric Specialty Exam:   Presentation  General Appearance:  Appropriate   Eye Contact: Good   Speech: Spontaneous   Speech Volume: Normal   Handedness: Right     Mood and Affect  Mood: "Good"   Affect: Stable, animated,     Thought Process  Thought Processes: Linear   Descriptions of Associations:Intact   Orientation:Full (Time, Place and Person)   Thought Content:Logical   History of Schizophrenia/Schizoaffective disorder:No   Duration of Psychotic Symptoms:NA Hallucinations:Denies Ideas of Reference:None   Suicidal Thoughts:Denies Homicidal Thoughts:Denies   Sensorium  Memory: Fair   Judgment: Improved   Insight: Improved     Executive Functions  Concentration: Fair   Attention Span: Fair   Recall: Fair   Fund of Knowledge: Fair   Language: Fair     Psychomotor Activity  Psychomotor Activity:Normal   Assets  Assets: Desire for Improvement; Communication Skills; Resilience     Sleep  Sleep:Fair     Physical Exam: Physical Exam Constitutional:      Appearance: Normal appearance.  HENT:     Head: Normocephalic and atraumatic.     Nose: Nose normal.  Eyes:     Pupils: Pupils are equal, round, and reactive to light.  Cardiovascular:     Rate and Rhythm: Regular rhythm.     Pulses: Normal pulses.  Pulmonary:     Effort: Pulmonary effort is normal.  Skin:    General: Skin is warm.  Neurological:     General: No focal deficit present.     Mental  Status: She is alert and oriented to person, place, and time.      Review of Systems  Constitutional: Negative.   HENT: Negative.    Eyes: Negative.   Cardiovascular: Negative.   Gastrointestinal: Negative.   Neurological: Negative.  Blood pressure (!) 141/78, pulse 78, temperature 98 F (36.7 C), resp. rate 18, height 5\' 4"  (1.626 m), weight 46.7 kg, SpO2 99%. Body mass index is 17.68 kg/m.   Social History   Tobacco Use  Smoking Status Every Day   Current packs/day: 1.00   Average packs/day: 1 pack/day for 41.0  years (41.0 ttl pk-yrs)   Types: Cigarettes  Smokeless Tobacco Never  Tobacco Comments   Less than a 1/2 pack daily   Tobacco Cessation:  A prescription for an FDA-approved tobacco cessation medication provided at discharge   Blood Alcohol level:  Lab Results  Component Value Date   ETH 402 (HH) 06/11/2023   ETH 82 (H) 09/24/2021    Metabolic Disorder Labs:  Lab Results  Component Value Date   HGBA1C 5.5 06/18/2023   MPG 111.15 06/18/2023   MPG 120 08/05/2021   No results found for: "PROLACTIN" Lab Results  Component Value Date   CHOL 202 (H) 06/18/2023   TRIG 186 (H) 06/18/2023   HDL 61 06/18/2023   CHOLHDL 3.3 06/18/2023   VLDL 37 06/18/2023   LDLCALC 104 (H) 06/18/2023   LDLCALC 55 05/28/2022    See Psychiatric Specialty Exam and Suicide Risk Assessment completed by Attending Physician prior to discharge.  Discharge destination:  Home  Is patient on multiple antipsychotic therapies at discharge:  No     Recommended Plan for Multiple Antipsychotic Therapies: NA   Allergies as of 06/20/2023       Reactions   Aspirin Nausea And Vomiting, Other (See Comments)   Pt states aspirin makes her cramp and have to use coated kind.   Tramadol    Zyrtec [cetirizine]         Medication List     STOP taking these medications    butalbital-acetaminophen-caffeine 50-325-40 MG tablet Commonly known as: FIORICET   cholecalciferol 10 MCG  (400 UNIT) Tabs tablet Commonly known as: VITAMIN D3   oxyCODONE-acetaminophen 5-325 MG tablet Commonly known as: Percocet       TAKE these medications      Indication  albuterol 108 (90 Base) MCG/ACT inhaler Commonly known as: VENTOLIN HFA INHALE 2 PUFFS BY MOUTH EVERY 6 HOURS AS NEEDED FOR WHEEZING FOR SHORTNESS OF BREATH    aspirin EC 81 MG tablet Take 1 tablet (81 mg total) by mouth daily.    atorvastatin 80 MG tablet Commonly known as: LIPITOR Take 1 tablet (80 mg total) by mouth daily.  Indication: High Amount of Fats in the Blood   budesonide-formoterol 80-4.5 MCG/ACT inhaler Commonly known as: SYMBICORT INHALE 2 PUFFS BY MOUTH ONCE DAILY    ezetimibe 10 MG tablet Commonly known as: ZETIA Take 1 tablet (10 mg total) by mouth daily.  Indication: High Amount of Fats in the Blood   ferrous sulfate 325 (65 FE) MG tablet Take 325 mg by mouth daily.    folic acid 1 MG tablet Commonly known as: FOLVITE Take 1 tablet (1 mg total) by mouth daily. Start taking on: June 21, 2023    gabapentin 100 MG capsule Commonly known as: NEURONTIN Take 1 capsule (100 mg total) by mouth 3 (three) times daily.    losartan 50 MG tablet Commonly known as: COZAAR Take 2 tablets (100 mg total) by mouth daily.  Indication: High Blood Pressure   metoprolol succinate 50 MG 24 hr tablet Commonly known as: TOPROL-XL TAKE 1 TABLET BY MOUTH ONCE DAILY WITH  OR  IMMEDIATELY  FOLLOWING  A  MEAL What changed: See the new instructions.    mirtazapine 15 MG tablet Commonly known as: REMERON Take 1 tablet (15 mg total) by mouth at bedtime.    multivitamin with minerals Tabs tablet Take 1 tablet by mouth daily. Start taking on: June 21, 2023    nicotine 14 mg/24hr patch Commonly known as: NICODERM CQ -  dosed in mg/24 hours Place 1 patch (14 mg total) onto the skin daily. Start taking on: June 21, 2023    Oxycodone HCl 10 MG Tabs Take 1 tablet (10 mg total) by mouth every 6  (six) hours as needed for moderate pain (pain score 4-6). What changed:  medication strength how much to take reasons to take this    thiamine 100 MG tablet Commonly known as: Vitamin B-1 Take 1 tablet (100 mg total) by mouth daily. Start taking on: June 21, 2023         Follow-up Information     Pllc, [employer]. Go to.   Why: A referral has been placed for you at the [facility] office ([address] [phone]). They will be contacting you directly to schedule, as is their policy. Contact information: [address] [place] [address] [phone]                PATIENTS CONDITION AT DISCHARGE:  Stable  TOBACCO CESSATION SCREENING  Patient was screened and counselled on smoking cessation at time of discharge.   PRESCRIPTION ARE LOCATED:  On Chart  DISCHARGE INSTRUCTIONS:  1. Diet: Cardiac healthy  2. Activity: As tolerated  3. Take medications as prescribed and not to make any changes without first consulting with the outpatient provider.  4. Patient was advised to avoid any illicit drugs or alcohol due to negative impact on physical and mental health.  5. Patient should keep all follow up appointments.  TIME SPENT ON DISCHARGE: Over 35 minutes were spent on this patients discharge including a face to face encounter, patient counseling and preparation of discharge materials.   Signed: Lewanda Rife, MD

## 2023-06-20 NOTE — BHH Counselor (Signed)
Pt given blank medicare rights due to signed copy not being scanned in by Medical Records at this time   [name], MSW, [facility] 06/20/2023 12:17 PM

## 2023-06-20 NOTE — Progress Notes (Signed)
D: Pt alert and oriented. Pt denies experiencing any pain, SI/HI, or AVH at this time. Pt reports she will be able to keep herself safe when they return home. Pt has completed a suicide safety plan and was given a survey to fill out. Pt transition record, SRA, and AVS reviewed with pt and given to them upon discharge. Pt denies wanting floor manger to contact them upon discharge.  A: Pt received discharge and medication education/information. Pt belongings were returned and signed for at this time to include printed prescriptions.   R: Pt verbalized understanding of discharge and medication education/information.  Pt escorted by staff to medical mall front lobby where pt was picked up by son.

## 2023-06-20 NOTE — Progress Notes (Signed)
  [facility] Adult Case Management Discharge Plan :  Will you be returning to the same living situation after discharge:  Yes,  pt will return home  At discharge, do you have transportation home?: Yes,  pt's son will pick her up  Do you have the ability to pay for your medications: Yes,   [employer] MEDICARE / Suan Halter DUAL COMPLETE  Release of information consent forms completed and in the chart;  Patient's signature needed at discharge.  Patient to Follow up at:  Follow-up Information     [facility]ll[facility], [employer]. Go to.   Why: A referral has been placed for you at the [facility] office ([address] [phone]). They will be contacting you directly to schedule, as is their policy. Contact information: [address] [place] [address] [phone]                 Next level of care provider has access to [facility] Link:no  Safety Planning and Suicide Prevention discussed: Yes,  although pt declined SPE with friend or family, CSW went over SPE brochure with pt at discharge      Has patient been referred to the Quitline?: Patient refused referral for treatment  Patient has been referred for addiction treatment: Patient refused referral for treatment.  [address] LCSWA 06/20/2023, 11:31 AM

## 2023-06-20 NOTE — [facility] Suicide Risk Assessment (Signed)
[facility] Discharge Suicide Risk Assessment   Principal Problem: MDD (major depressive disorder) Discharge Diagnoses: Principal Problem:   MDD (major depressive disorder) Active Problems:   MDD (major depressive disorder), recurrent severe, without psychosis (HCC)    Musculoskeletal: Strength & Muscle Tone: within normal limits Gait & Station: normal Patient leans: N/A   Psychiatric Specialty Exam:   Presentation  General Appearance:  Appropriate   Eye Contact: Good   Speech: Spontaneous   Speech Volume: Normal   Handedness: Right     Mood and Affect  Mood: "Good"   Affect: Stable, animated,     Thought Process  Thought Processes: Linear   Descriptions of Associations:Intact   Orientation:Full (Time, Place and Person)   Thought Content:Logical   History of Schizophrenia/Schizoaffective disorder:No   Duration of Psychotic Symptoms:NA Hallucinations:Denies Ideas of Reference:None   Suicidal Thoughts:Denies Homicidal Thoughts:Denies   Sensorium  Memory: Fair   Judgment: Improved   Insight: Improved     Executive Functions  Concentration: Fair   Attention Span: Fair   Recall: Fair   Fund of Knowledge: Fair   Language: Fair     Psychomotor Activity  Psychomotor Activity:Normal   Assets  Assets: Desire for Improvement; Communication Skills; Resilience     Sleep  Sleep:Fair     Physical Exam: Physical Exam Constitutional:      Appearance: Normal appearance.  HENT:     Head: Normocephalic and atraumatic.     Nose: Nose normal.  Eyes:     Pupils: Pupils are equal, round, and reactive to light.  Cardiovascular:     Rate and Rhythm: Regular rhythm.     Pulses: Normal pulses.  Pulmonary:     Effort: Pulmonary effort is normal.  Skin:    General: Skin is warm.  Neurological:     General: No focal deficit present.     Mental Status: She is alert and oriented to person, place, and time.      Review of Systems   Constitutional: Negative.   HENT: Negative.    Eyes: Negative.   Cardiovascular: Negative.   Gastrointestinal: Negative.   Neurological: Negative.    Blood pressure (!) 141/78, pulse 78, temperature 98 F (36.7 C), resp. rate 18, height 5\' 4"  (1.626 m), weight 46.7 kg, SpO2 99%. Body mass index is 17.68 kg/m.   Demographic Factors:  Age 26 or older  Loss Factors: Daughter died of Overdose  Historical Factors: Impulsivity  Risk Reduction Factors:   Positive social support, Positive therapeutic relationship, and Positive coping skills or problem solving skills  Continued Clinical Symptoms:  Previous Psychiatric Diagnoses and Treatments  Cognitive Features That Contribute To Risk:  Thought constriction (tunnel vision)    Suicide Risk:  Minimal: No identifiable suicidal ideation.     Follow-up Information     [facility], [employer]. Go to.   Why: A referral has been placed for you at the [facility] office ([address] [phone]). They will be contacting you directly to schedule, as is their policy. Contact information: [address] [place] [address] [phone]                 Plan Of Care/Follow-up recommendations:  Per Discharge Summary  Lewanda Rife, MD

## 2023-06-21 ENCOUNTER — Encounter (INDEPENDENT_AMBULATORY_CARE_PROVIDER_SITE_OTHER): Payer: 59

## 2023-06-21 ENCOUNTER — Encounter (INDEPENDENT_AMBULATORY_CARE_PROVIDER_SITE_OTHER): Payer: Medicare Other

## 2023-06-21 ENCOUNTER — Ambulatory Visit (INDEPENDENT_AMBULATORY_CARE_PROVIDER_SITE_OTHER): Payer: 59 | Admitting: Vascular Surgery

## 2023-06-24 ENCOUNTER — Inpatient Hospital Stay: Payer: 59 | Admitting: Nurse Practitioner

## 2023-06-26 ENCOUNTER — Telehealth (INDEPENDENT_AMBULATORY_CARE_PROVIDER_SITE_OTHER): Payer: 59 | Admitting: Nurse Practitioner

## 2023-06-26 ENCOUNTER — Encounter: Payer: Self-pay | Admitting: Nurse Practitioner

## 2023-06-26 VITALS — BP 114/72 | HR 101 | Temp 97.8°F | Resp 16 | Ht 64.0 in | Wt 109.0 lb

## 2023-06-26 DIAGNOSIS — F331 Major depressive disorder, recurrent, moderate: Secondary | ICD-10-CM | POA: Diagnosis not present

## 2023-06-26 DIAGNOSIS — F1090 Alcohol use, unspecified, uncomplicated: Secondary | ICD-10-CM | POA: Diagnosis not present

## 2023-06-26 DIAGNOSIS — J449 Chronic obstructive pulmonary disease, unspecified: Secondary | ICD-10-CM

## 2023-06-26 DIAGNOSIS — Z09 Encounter for follow-up examination after completed treatment for conditions other than malignant neoplasm: Secondary | ICD-10-CM

## 2023-06-26 NOTE — Progress Notes (Signed)
[facility] [address] [place], [place] [address]  Internal MEDICINE  Telephone Visit  Patient Name: [name]  [id number]  [id number]  Date of Service: 06/26/2023  I connected with the patient at 1440 by telephone and verified the patients identity using two identifiers.   I discussed the limitations, risks, security and privacy concerns of performing an evaluation and management service by telephone and the availability of in person appointments. I also discussed with the patient that there may be a patient responsible charge related to the service.  The patient expressed understanding and agrees to proceed.    Chief Complaint  Patient presents with   Hospitalization Follow-up    Depression    Telephone Screen   Telephone Assessment    HPI [name] presents for a telehealth virtual visit for hospital discharge follow up  Date of Admission:  06/12/2023 Date of Discharge: 06/20/2023  Reason for Admission:   [name] is a 67 year old African-American female who was involuntarily admitted to geriatric psychiatry for alcohol abuse and depression along with suicidal ideation.    Principal Problem: MDD (major depressive disorder) Discharge Diagnoses: Principal Problem:   MDD (major depressive disorder) Active Problems:   MDD (major depressive disorder), recurrent severe, without psychosis [facility])    Hospital Course:  The patient was admitted to Inpatient psychiatric treatment for stabilization of depression and suicidal thoughts.  Patient was placed on suicidal precautions. The patient was evaluated and treated by the multidisciplinary treatment team including physicians, nurses, social workers and therapists. All medications were presented to the patient and the Patient gave consent to all the medications that they were given, as well as was explained the risks, benefits, side effects and alternatives of all medication therapies. The patient was integrated into the  general milieu on the ward and encouraged to attend to her ADLs and participate in all groups and activities. During hospital course the Patient attended coping skill groups, music therapy and activity therapy groups. Patient was counseled on cognitive techniques/skills by multiple staff members and given support care by the staff.   Patient's medication regimen was evaluated and titrated to therapeutic levels to better Patient's overall daily functioning. Specifically, the patient was started on CIWA protocol to detox from alcohol.  Patient was continued on Remeron 15 mg at bedtime for depression.  She tolerated the medication well with no significant side effects. Patient shared that prior to admission she was trying to drink herself to death.  Patient's alcohol level at admission was in 400s.  Patient said that attending groups and being on unit had made her realize that she has other family members including her daughter, son, and grand kids to live for.    During the hospitalization, the patient demonstrated a stabilization of mood with decreased racing thoughts, decreased impulsivity, improved sleep and decreased irritability. At the time of discharge, the patient denied any suicidal ideation/homicidal ideation and was not overtly depressed, manic or psychotic. The Patient was interacting well in groups and on the unit with their peers. Patient was able to identify a safety plan to include speaking with family, contacting outpatient provider or calling 911 if hallucinations/delusions returned or worsened or thoughts of self-harm or suicide return. Patient was counselled on outpatient follow-up that was arranged prior to discharge.         Current Medication: Outpatient Encounter Medications as of 06/26/2023  Medication Sig Note   albuterol (VENTOLIN HFA) 108 (90 Base) MCG/ACT inhaler INHALE 2 PUFFS BY MOUTH EVERY 6 HOURS AS  NEEDED FOR WHEEZING FOR SHORTNESS OF BREATH    aspirin EC 81 MG EC tablet  Take 1 tablet (81 mg total) by mouth daily.    atorvastatin (LIPITOR) 80 MG tablet Take 1 tablet (80 mg total) by mouth daily.    budesonide-formoterol (SYMBICORT) 80-4.5 MCG/ACT inhaler INHALE 2 PUFFS BY MOUTH ONCE DAILY (Patient taking differently: Inhale 2 puffs into the lungs 2 (two) times daily as needed (respiratory issues.).)    ezetimibe (ZETIA) 10 MG tablet Take 1 tablet (10 mg total) by mouth daily.    ferrous sulfate 325 (65 FE) MG tablet Take 325 mg by mouth daily.    folic acid (FOLVITE) 1 MG tablet Take 1 tablet (1 mg total) by mouth daily.    gabapentin (NEURONTIN) 100 MG capsule Take 1 capsule (100 mg total) by mouth 3 (three) times daily. 07/15/2023: On hold per patient needs refills   losartan (COZAAR) 50 MG tablet Take 2 tablets (100 mg total) by mouth daily.    metoprolol succinate (TOPROL-XL) 50 MG 24 hr tablet TAKE 1 TABLET BY MOUTH ONCE DAILY WITH  OR  IMMEDIATELY  FOLLOWING  A  MEAL    Multiple Vitamin (MULTIVITAMIN WITH MINERALS) TABS tablet Take 1 tablet by mouth daily.    nicotine (NICODERM CQ - DOSED IN MG/24 HOURS) 14 mg/24hr patch Place 1 patch (14 mg total) onto the skin daily.    thiamine (VITAMIN B-1) 100 MG tablet Take 1 tablet (100 mg total) by mouth daily.    [DISCONTINUED] mirtazapine (REMERON) 15 MG tablet Take 1 tablet (15 mg total) by mouth at bedtime.    [DISCONTINUED] oxyCODONE 10 MG TABS Take 1 tablet (10 mg total) by mouth every 6 (six) hours as needed for moderate pain (pain score 4-6).    No facility-administered encounter medications on file as of 06/26/2023.    Surgical History: Past Surgical History:  Procedure Laterality Date   ABDOMINAL HYSTERECTOMY     APPENDECTOMY     CARDIAC CATHETERIZATION     CARDIAC SURGERY     CHOLECYSTECTOMY     COLONOSCOPY  12/15/2011   OH->bleeding internal hemorrhoids, otherwise normal   COLONOSCOPY WITH PROPOFOL N/A 04/12/2015   Procedure: COLONOSCOPY WITH PROPOFOL;  Surgeon: Midge Minium, MD;  Location: [facility]  ENDOSCOPY;  Service: Endoscopy;  Laterality: N/A;   COLONOSCOPY WITH PROPOFOL N/A 01/05/2020   Procedure: COLONOSCOPY WITH PROPOFOL;  Surgeon: Midge Minium, MD;  Location: [facility] ENDOSCOPY;  Service: Endoscopy;  Laterality: N/A;   ENDARTERECTOMY FEMORAL Left 04/26/2015   Procedure: ENDARTERECTOMY FEMORAL;  Surgeon: Annice Needy, MD;  Location: [facility] ORS;  Service: Vascular;  Laterality: Left;   ENDOVASCULAR STENT INSERTION Bilateral    Legs   FLEXIBLE BRONCHOSCOPY N/A 02/02/2015   Procedure: FLEXIBLE BRONCHOSCOPY;  Surgeon: Yevonne Pax, MD;  Location: [facility] ORS;  Service: Pulmonary;  Laterality: N/A;   HEMORRHOID SURGERY     lung mass removed     PERIPHERAL VASCULAR CATHETERIZATION Left 04/25/2015   Procedure: Lower Extremity Angiography;  Surgeon: Annice Needy, MD;  Location: [facility] INVASIVE CV LAB;  Service: Cardiovascular;  Laterality: Left;   PERIPHERAL VASCULAR CATHETERIZATION Left 04/25/2015   Procedure: Lower Extremity Intervention;  Surgeon: Annice Needy, MD;  Location: [facility] INVASIVE CV LAB;  Service: Cardiovascular;  Laterality: Left;   PERIPHERAL VASCULAR CATHETERIZATION Left 01/02/2016   Procedure: Lower Extremity Angiography;  Surgeon: Annice Needy, MD;  Location: [facility] INVASIVE CV LAB;  Service: Cardiovascular;  Laterality: Left;   PERIPHERAL VASCULAR CATHETERIZATION  01/02/2016   Procedure: Lower Extremity Intervention;  Surgeon: Annice Needy, MD;  Location: [facility] INVASIVE CV LAB;  Service: Cardiovascular;;   THROMBECTOMY FEMORAL ARTERY Left 04/26/2015   Procedure: THROMBECTOMY FEMORAL ARTERY;  Surgeon: Annice Needy, MD;  Location: [facility] ORS;  Service: Vascular;  Laterality: Left;   VENTRAL HERNIA REPAIR N/A 07/25/2023   Procedure: HERNIA REPAIR VENTRAL ADULT, open;  Surgeon: Henrene Dodge, MD;  Location: [facility] ORS;  Service: General;  Laterality: N/A;    Medical History: Past Medical History:  Diagnosis Date   Adenocarcinoma of upper lobe of right lung (HCC)    a.) s/p posterior  segmentectomy on 03/20/23 at [facility]   Anemia    Arthritis    Asthma    Carotid artery stenosis    a.) doppler 05/14/2017: 1-39% BICA; b.) doppler 04/05/2018: 50-69% BICA; c.) doppler 06/12/2019: 40-59% BICA; d.) doppler 06/14/2020, 06/14/2021: 1-39% BICA; e.) doppler 06/19/2022: 1.39% RICA, 40-59% LICA   Colon polyp    COPD (chronic obstructive pulmonary disease) (HCC)    Coronary artery disease 04/03/2006   a.) LHC 04/03/2006: 25% mLM, 25% mLAD, 50% D1, 25/50% mLCx, 50% dLCx, 25% OM2, 100% pRCA --> med mgmt; b.) LHC 04/25/2010: 25% pLM, 25% pLAD, 50% pLCx, 70% dLCx, 100% pRCA --> med mgmt   Depression    Diastolic dysfunction    a.) TTE 04/05/2018: EF 65%, no RWMAs, G1DD, triv MR/TR; b.) TTE 10/15/2022: EF 88%, mod LVH, G1DD, triv TR, mild MR; c.) TTE 02/27/2023: EF 65%, no RWMAs, triv TR/MR, RVSP 35; d.) TTE 03/21/2023: EF 65%, no RWMAs, norm RVSF, PASP 44   Environmental allergies    Headache    Hyperlipidemia    Hypertension    Hypoxic respiratory failure (HCC)    Internal hemorrhoids    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[facility])    a.) thinks it was in 2007 --> LHC 04/03/2006 revealed a 100% pRCA --> medically managed   Necrotizing pneumonia (HCC)    Pulmonary HTN (HCC)    a.) TTE 02/27/2023: RVSP 35 mmHg; b.) TTE 03/21/2023: PASP 44 mmHg   PVD (peripheral vascular disease) with claudication (HCC)    Shortness of breath dyspnea    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    Substance abuse (HCC)    Substance induced mood disorder (HCC)    Suicidal ideation    Thrombocytopenia (HCC)    Tobacco abuse    Unstable angina (HCC)    Ventral hernia    Vitamin D deficiency     Family History: Family History  Problem Relation Age of Onset   Hypertension Mother    Hypertension Father     Social History   Socioeconomic History   Marital status: Single    Spouse name: Not on file   Number of children: Not on file   Years of education: Not on file   Highest education level: Not on file   Occupational History   Not on file  Tobacco Use   Smoking status: Every Day    Current packs/day: 1.00    Types: Cigarettes    Passive exposure: Past   Smokeless tobacco: Never  Vaping Use   Vaping status: Never Used  Substance and Sexual Activity   Alcohol use: Yes    Alcohol/week: 0.0 standard drinks of alcohol    Comment: weekends, couple beers, pint of liquer only on weekends.  Last drank approx 1 month ago.   Drug use: No   Sexual activity: Not Currently  Other Topics Concern  Not on file  Social History Narrative   Lives with family    Social Determinants of Health   Financial Resource Strain: Low Risk  (03/21/2023)   Received from [facility]   Overall Financial Resource Strain (CARDIA)    Difficulty of Paying Living Expenses: Not hard at all  Food Insecurity: No Food Insecurity (06/12/2023)   Hunger Vital Sign    Worried About Running Out of Food in the Last Year: Never true    Ran Out of Food in the Last Year: Never true  Transportation Needs: No Transportation Needs (06/12/2023)   PRAPARE - Administrator, Civil Service (Medical): No    Lack of Transportation (Non-Medical): No  Physical Activity: Inactive (04/04/2018)   Exercise Vital Sign    Days of Exercise per Week: 0 days    Minutes of Exercise per Session: 0 min  Stress: No Stress Concern Present (04/04/2018)   [employer] of Occupational Health - Occupational Stress Questionnaire    Feeling of Stress : Only a little  Social Connections: Moderately Isolated (04/04/2018)   Social Connection and Isolation Panel [NHANES]    Frequency of Communication with Friends and Family: Once a week    Frequency of Social Gatherings with Friends and Family: Once a week    Attends Religious Services: 1 to 4 times per year    Active Member of [employer] or Organizations: No    Attends Banker Meetings: Never    Marital Status: Never married  Intimate Partner Violence: Not At Risk (06/12/2023)    Humiliation, Afraid, Rape, and Kick questionnaire    Fear of Current or Ex-Partner: No    Emotionally Abused: No    Physically Abused: No    Sexually Abused: No      Review of Systems  Constitutional:  Positive for appetite change and fatigue.  HENT: Negative.    Respiratory: Negative.  Negative for cough, chest tightness, shortness of breath and wheezing.   Cardiovascular: Negative.  Negative for chest pain and palpitations.  Neurological: Negative.   Psychiatric/Behavioral:  Positive for behavioral problems and sleep disturbance. Negative for self-injury and suicidal ideas. The patient is not nervous/anxious.     Vital Signs: BP 114/72   Pulse (!) 101   Temp 97.8 F (36.6 C)   Resp 16   Ht 5\' 4"  (1.626 m)   Wt 109 lb (49.4 kg)   LMP  (LMP Unknown)   SpO2 96%   BMI 18.71 kg/m    Observation/Objective: She is alert and oriented. No acute distress noted.     Assessment/Plan: 1. Hospital discharge follow-up Discharged 6 days ago from behavioral health after being admitted for alcohol intoxication and suicidal ideations. She denies any thoughts of suicide or self harm and acknowledges that she will call a friend, the clinic or 911 if she is having those types of thoughts and does not feel safe.   2. Chronic obstructive pulmonary disease, unspecified COPD type (HCC) Breathing remains stable, no issues per patient.  3. Alcohol use disorder Admits that she is still drinking but states that she is decreasing the amount she drinks.   4. MDD (major depressive disorder), recurrent moderate(HCC) Was previously on mirtazapine but this was discontinued by the hospital. Does not have any follow up with psychiatry at this time. Will discuss possible referral if needed at her next office visit.     General Counseling: Amandamarie verbalizes understanding of the findings of today's phone visit and agrees with  plan of treatment. I have discussed any further diagnostic evaluation that  may be needed or ordered today. We also reviewed her medications today. she has been encouraged to call the office with any questions or concerns that should arise related to todays visit.  Return in about 3 weeks (around 07/17/2023) for F/U, Tadd Holtmeyer PCP.   No orders of the defined types were placed in this encounter.   No orders of the defined types were placed in this encounter.   Time spent:20 Minutes Time spent with patient included reviewing progress notes, labs, imaging studies, and discussing plan for follow up.  [place] Controlled Substance Database was reviewed by me for overdose risk score (ORS) if appropriate.  This patient was seen by Sallyanne Kuster, FNP-C in collaboration with Dr. Beverely Risen as a part of collaborative care agreement.  Trinette Vera R. Tedd Sias, MSN, FNP-C Internal medicine

## 2023-06-27 DIAGNOSIS — R911 Solitary pulmonary nodule: Secondary | ICD-10-CM | POA: Diagnosis not present

## 2023-07-01 ENCOUNTER — Ambulatory Visit (INDEPENDENT_AMBULATORY_CARE_PROVIDER_SITE_OTHER): Payer: 59 | Admitting: Physician Assistant

## 2023-07-01 ENCOUNTER — Encounter: Payer: Self-pay | Admitting: Physician Assistant

## 2023-07-01 VITALS — BP 128/80 | HR 82 | Temp 97.8°F | Resp 16 | Ht 64.0 in | Wt 109.0 lb

## 2023-07-01 DIAGNOSIS — J4489 Other specified chronic obstructive pulmonary disease: Secondary | ICD-10-CM | POA: Diagnosis not present

## 2023-07-01 DIAGNOSIS — F17209 Nicotine dependence, unspecified, with unspecified nicotine-induced disorders: Secondary | ICD-10-CM

## 2023-07-01 DIAGNOSIS — Z85118 Personal history of other malignant neoplasm of bronchus and lung: Secondary | ICD-10-CM | POA: Diagnosis not present
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
## 2023-07-03 ENCOUNTER — Ambulatory Visit: Payer: 59 | Admitting: Surgery

## 2023-07-08 ENCOUNTER — Telehealth: Payer: Self-pay | Admitting: Surgery

## 2023-07-08 ENCOUNTER — Encounter: Payer: Self-pay | Admitting: Surgery

## 2023-07-08 ENCOUNTER — Ambulatory Visit (INDEPENDENT_AMBULATORY_CARE_PROVIDER_SITE_OTHER): Payer: 59 | Admitting: Surgery

## 2023-07-08 VITALS — BP 140/78 | HR 77 | Temp 98.0°F | Ht 64.0 in | Wt 106.4 lb

## 2023-07-08 DIAGNOSIS — K430 Incisional hernia with obstruction, without gangrene: Secondary | ICD-10-CM | POA: Diagnosis not present

## 2023-07-08 NOTE — Telephone Encounter (Signed)
Left message for patient to call, please inform her of the following regarding scheduled surgery with Dr. Aleen Campi.   Pre-Admission date/time, and Surgery date at [facility].  Surgery Date: 07/25/23 Preadmission Testing Date: 07/18/23 (phone 1p-4p)  Also patient will need to call at [phone], between 1-3:00pm the day before surgery, to find out what time to arrive for surgery.

## 2023-07-08 NOTE — Progress Notes (Signed)
Request for Medical Clearance has been faxed to Sallyanne Kuster, NP at [facility].

## 2023-07-08 NOTE — Progress Notes (Signed)
07/08/2023  Reason for Visit:  Incisional hernia  Requesting Provider:  Sallyanne Kuster, NP  History of Present Illness: [name] is a 67 y.o. female presenting for evaluation of an incisional hernia.  The patient has a history of laparoscopic cholecystectomy.  Most recently, she had a robotic assisted right VATS segmentectomy in July 2024.  She reports that after her surgery, she noticed a bulging in the epigastric region at the site of a prior incision.  Reviewing her operative note, this area of bulging does not match the robotic port sites.  She reports that she's been having more pain recently in this area.  The bulging is constant, and it is tender.  Can be more tender when pushing on it or doing something strenuous, so she has been taking it very easy lately.  She had an ultrasound of her epigastric region on 05/15/23 and a small hernia defect was noted, likely containing fat.  She had a CT scan on 05/05/23 which initially did not comment on the epigastric area, but after comparing with her ultrasound, there is a fascial defect in the epigastric region.    Of note, patient still smokes.  She is also on 81 mg Aspirin daily.  Past Medical History: Past Medical History:  Diagnosis Date   Allergy    Environmental   Arthritis    Asthma    Carotid artery stenosis    Carotid stenosis    COPD (chronic obstructive pulmonary disease) (HCC)    Coronary artery disease    Coronary artery disease    Hemorrhoids    Hyperlipidemia    Hypertension    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(HCC)    negative cardiac cath   Nicotine addiction    Peripheral vascular disease (HCC)    Pneumonia    Shortness of breath dyspnea    Tobacco abuse      Past Surgical History: Past Surgical History:  Procedure Laterality Date   ABDOMINAL HYSTERECTOMY     APPENDECTOMY     CARDIAC CATHETERIZATION     CARDIAC SURGERY     CHOLECYSTECTOMY     COLONOSCOPY  12/15/11   OH->bleeding internal hemorrhoids, otherwise normal    COLONOSCOPY WITH PROPOFOL N/A 04/12/2015   Procedure: COLONOSCOPY WITH PROPOFOL;  Surgeon: Midge Minium, MD;  Location: [facility];  Service: Endoscopy;  Laterality: N/A;   COLONOSCOPY WITH PROPOFOL N/A 01/05/2020   Procedure: COLONOSCOPY WITH PROPOFOL;  Surgeon: Midge Minium, MD;  Location: [facility];  Service: Endoscopy;  Laterality: N/A;   ENDARTERECTOMY FEMORAL Left 04/26/2015   Procedure: ENDARTERECTOMY FEMORAL;  Surgeon: Annice Needy, MD;  Location: [facility] ORS;  Service: Vascular;  Laterality: Left;   ENDOVASCULAR STENT INSERTION Bilateral    Legs   FLEXIBLE BRONCHOSCOPY N/A 02/02/2015   Procedure: FLEXIBLE BRONCHOSCOPY;  Surgeon: Yevonne Pax, MD;  Location: [facility];  Service: Pulmonary;  Laterality: N/A;   HEMORRHOID SURGERY     PERIPHERAL VASCULAR CATHETERIZATION Left 04/25/2015   Procedure: Lower Extremity Angiography;  Surgeon: Annice Needy, MD;  Location: [facility];  Service: Cardiovascular;  Laterality: Left;   PERIPHERAL VASCULAR CATHETERIZATION Left 04/25/2015   Procedure: Lower Extremity Intervention;  Surgeon: Annice Needy, MD;  Location: [facility];  Service: Cardiovascular;  Laterality: Left;   PERIPHERAL VASCULAR CATHETERIZATION Left 01/02/2016   Procedure: Lower Extremity Angiography;  Surgeon: Annice Needy, MD;  Location: [facility];  Service: Cardiovascular;  Laterality: Left;   PERIPHERAL VASCULAR CATHETERIZATION  01/02/2016  Procedure: Lower Extremity Intervention;  Surgeon: Annice Needy, MD;  Location: [facility];  Service: Cardiovascular;;   THROMBECTOMY FEMORAL ARTERY Left 04/26/2015   Procedure: THROMBECTOMY FEMORAL ARTERY;  Surgeon: Annice Needy, MD;  Location: [facility];  Service: Vascular;  Laterality: Left;    Home Medications: Prior to Admission medications   Medication Sig Start Date End Date Taking? Authorizing Provider  albuterol (VENTOLIN HFA) 108 (90 Base) MCG/ACT inhaler INHALE 2 PUFFS BY MOUTH EVERY 6 HOURS AS NEEDED  FOR WHEEZING FOR SHORTNESS OF BREATH 01/21/23  Yes Abernathy, Arlyss Repress, NP  aspirin EC 81 MG EC tablet Take 1 tablet (81 mg total) by mouth daily. 04/29/15  Yes Annice Needy, MD  atorvastatin (LIPITOR) 80 MG tablet Take 1 tablet (80 mg total) by mouth daily. 04/10/23  Yes Abernathy, Arlyss Repress, NP  budesonide-formoterol (SYMBICORT) 80-4.5 MCG/ACT inhaler INHALE 2 PUFFS BY MOUTH ONCE DAILY 11/23/22  Yes Abernathy, Alyssa, NP  ezetimibe (ZETIA) 10 MG tablet Take 1 tablet (10 mg total) by mouth daily. 04/10/23  Yes Abernathy, Arlyss Repress, NP  ferrous sulfate 325 (65 FE) MG tablet Take 325 mg by mouth daily.   Yes [provider]  folic acid (FOLVITE) 1 MG tablet Take 1 tablet (1 mg total) by mouth daily. 06/21/23  Yes Lewanda Rife, MD  gabapentin (NEURONTIN) 100 MG capsule Take 1 capsule (100 mg total) by mouth 3 (three) times daily. 06/20/23  Yes Lewanda Rife, MD  losartan (COZAAR) 50 MG tablet Take 2 tablets (100 mg total) by mouth daily. 04/10/23  Yes Abernathy, Arlyss Repress, NP  metoprolol succinate (TOPROL-XL) 50 MG 24 hr tablet TAKE 1 TABLET BY MOUTH ONCE DAILY WITH  OR  IMMEDIATELY  FOLLOWING  A  MEAL Patient taking differently: Take 50 mg by mouth daily. WITH  OR  IMMEDIATELY  FOLLOWING  A  MEAL 01/18/23  Yes Adrian Blackwater A, MD  mirtazapine (REMERON) 15 MG tablet Take 1 tablet (15 mg total) by mouth at bedtime. 04/10/23  Yes Abernathy, Arlyss Repress, NP  Multiple Vitamin (MULTIVITAMIN WITH MINERALS) TABS tablet Take 1 tablet by mouth daily. 06/21/23  Yes Lewanda Rife, MD  nicotine (NICODERM CQ - DOSED IN MG/24 HOURS) 14 mg/24hr patch Place 1 patch (14 mg total) onto the skin daily. 06/21/23  Yes Lewanda Rife, MD  oxyCODONE 10 MG TABS Take 1 tablet (10 mg total) by mouth every 6 (six) hours as needed for moderate pain (pain score 4-6). 06/20/23  Yes Lewanda Rife, MD  OXYGEN Inhale 2.5 L/hr into the lungs as needed.   Yes [provider]  thiamine (VITAMIN B-1) 100 MG tablet Take 1 tablet  (100 mg total) by mouth daily. 06/21/23  Yes Lewanda Rife, MD    Allergies: Allergies  Allergen Reactions   Aspirin Nausea And Vomiting and Other (See Comments)    Pt states aspirin makes her cramp and have to use coated kind.   Tramadol    Zyrtec [Cetirizine]     Social History:  reports that she has been smoking cigarettes. She has been exposed to tobacco smoke. She has never used smokeless tobacco. She reports current alcohol use. She reports that she does not use drugs.   Family History: Family History  Problem Relation Age of Onset   Hypertension Mother    Hypertension Father     Review of Systems: Review of Systems  Constitutional:  Negative for chills and fever.  HENT:  Negative for hearing loss.   Respiratory:  Negative for shortness of  breath.   Cardiovascular:  Negative for chest pain.  Gastrointestinal:  Positive for abdominal pain. Negative for nausea and vomiting.  Genitourinary:  Negative for dysuria.  Musculoskeletal:  Negative for myalgias.  Skin:  Negative for rash.  Neurological:  Negative for dizziness.  Psychiatric/Behavioral:  Negative for depression.     Physical Exam BP (!) 140/78   Pulse 77   Temp 98 F (36.7 C)   Ht 5\' 4"  (1.626 m)   Wt 106 lb 6.4 oz (48.3 kg)   LMP  (LMP Unknown)   SpO2 100%   BMI 18.26 kg/m  CONSTITUTIONAL: No acute distress, well nourished. HEENT:  Normocephalic, atraumatic, extraocular motion intact. NECK: Trachea is midline, and there is no jugular venous distension.  RESPIRATORY:  Lungs are clear, and breath sounds are equal bilaterally. Normal respiratory effort without pathologic use of accessory muscles. CARDIOVASCULAR: Heart is regular without murmurs, gallops, or rubs. GI: The abdomen is soft, non-distended, with tenderness to palpation in the epigastric region.  There is a likely port site in subxyphoid location which a bulging that is tender to palpation, incarcerated, consistent with a hernia.  At the  umbilicus, there is no tenderness and no palpable bulging or defect.  MUSCULOSKELETAL:  Normal muscle strength and tone in all four extremities.  No peripheral edema or cyanosis. SKIN: Skin turgor is normal. There are no pathologic skin lesions.  NEUROLOGIC:  Motor and sensation is grossly normal.  Cranial nerves are grossly intact. PSYCH:  Alert and oriented to person, place and time. Affect is normal.  Laboratory Analysis: Labs from 06/11/23: Na 140, K 3.3, Cl 103, CO2 22, BUN 16, Cr 0.9.  LFTs within normal.  WBC 5.5, Hgb 12.3, Hct 37.2, Plt 201.  Imaging: Ultrasound upper abdomen on 05/15/23: IMPRESSION: 1. Irregular, heterogeneous mass in the subcutaneous soft tissues of the midline upper abdomen. On review of the prior CT scan, there is a subtle fascial defect in the midline upper abdomen, above a small fat containing umbilical hernia. This would correspond in location to the current palpable mass. Although the etiology of this lump is not definitive, it is suspected to be herniated peritoneal fat from a small midline hernia. This could be further assessed, if desired clinically, with MRI.  Assessment and Plan: This is a 67 y.o. female with an incarcerated incisional hernia.  --Discussed with the patient that on exam, there is an incarcerated incisional hernia in the epigastric area, likely what was a previous port site for her cholecystectomy.  Although there is mention on her imaging of an umbilical hernia, there is no bulging or palpable defect on exam.  Her incisional hernia is tender and incarcerated. --Discussed with her that we can help with repair of her hernia.  Discussed with her the option for an open incisional hernia repair.  Reviewed the surgery at length with her including the planned incision, risks of bleeding, infection, injury to surrounding structures, that this would be an outpatient surgery, the possibility of mesh, pain control, activity restrictions, and she's  willing to proceed. --Will schedule her for surgery on 07/25/23.  All of her questions have been answered.  Will also send for medical clearance.  Will need to hold her ASA with last dose on 07/19/23.  I spent 55 minutes dedicated to the care of this patient on the date of this encounter to include pre-visit review of records, face-to-face time with the patient discussing diagnosis and management, and any post-visit coordination of care.   Slater  Aleen Campi, MD [facility]

## 2023-07-08 NOTE — H&P (View-Only) (Signed)
 07/08/2023  Reason for Visit:  Incisional hernia  Requesting Provider:  Sallyanne Kuster, NP  History of Present Illness: [name] is a 67 y.o. female presenting for evaluation of an incisional hernia.  The patient has a history of laparoscopic cholecystectomy.  Most recently, she had a robotic assisted right VATS segmentectomy in July 2024.  She reports that after her surgery, she noticed a bulging in the epigastric region at the site of a prior incision.  Reviewing her operative note, this area of bulging does not match the robotic port sites.  She reports that she's been having more pain recently in this area.  The bulging is constant, and it is tender.  Can be more tender when pushing on it or doing something strenuous, so she has been taking it very easy lately.  She had an ultrasound of her epigastric region on 05/15/23 and a small hernia defect was noted, likely containing fat.  She had a CT scan on 05/05/23 which initially did not comment on the epigastric area, but after comparing with her ultrasound, there is a fascial defect in the epigastric region.    Of note, patient still smokes.  She is also on 81 mg Aspirin daily.  Past Medical History: Past Medical History:  Diagnosis Date   Allergy    Environmental   Arthritis    Asthma    Carotid artery stenosis    Carotid stenosis    COPD (chronic obstructive pulmonary disease) (HCC)    Coronary artery disease    Coronary artery disease    Hemorrhoids    Hyperlipidemia    Hypertension    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(HCC)    negative cardiac cath   Nicotine addiction    Peripheral vascular disease (HCC)    Pneumonia    Shortness of breath dyspnea    Tobacco abuse      Past Surgical History: Past Surgical History:  Procedure Laterality Date   ABDOMINAL HYSTERECTOMY     APPENDECTOMY     CARDIAC CATHETERIZATION     CARDIAC SURGERY     CHOLECYSTECTOMY     COLONOSCOPY  12/15/11   OH->bleeding internal hemorrhoids, otherwise normal    COLONOSCOPY WITH PROPOFOL N/A 04/12/2015   Procedure: COLONOSCOPY WITH PROPOFOL;  Surgeon: Midge Minium, MD;  Location: [facility];  Service: Endoscopy;  Laterality: N/A;   COLONOSCOPY WITH PROPOFOL N/A 01/05/2020   Procedure: COLONOSCOPY WITH PROPOFOL;  Surgeon: Midge Minium, MD;  Location: [facility] ENDOSCOPY;  Service: Endoscopy;  Laterality: N/A;   ENDARTERECTOMY FEMORAL Left 04/26/2015   Procedure: ENDARTERECTOMY FEMORAL;  Surgeon: Annice Needy, MD;  Location: [facility];  Service: Vascular;  Laterality: Left;   ENDOVASCULAR STENT INSERTION Bilateral    Legs   FLEXIBLE BRONCHOSCOPY N/A 02/02/2015   Procedure: FLEXIBLE BRONCHOSCOPY;  Surgeon: Yevonne Pax, MD;  Location: [facility];  Service: Pulmonary;  Laterality: N/A;   HEMORRHOID SURGERY     PERIPHERAL VASCULAR CATHETERIZATION Left 04/25/2015   Procedure: Lower Extremity Angiography;  Surgeon: Annice Needy, MD;  Location: [facility];  Service: Cardiovascular;  Laterality: Left;   PERIPHERAL VASCULAR CATHETERIZATION Left 04/25/2015   Procedure: Lower Extremity Intervention;  Surgeon: Annice Needy, MD;  Location: [facility];  Service: Cardiovascular;  Laterality: Left;   PERIPHERAL VASCULAR CATHETERIZATION Left 01/02/2016   Procedure: Lower Extremity Angiography;  Surgeon: Annice Needy, MD;  Location: [facility];  Service: Cardiovascular;  Laterality: Left;   PERIPHERAL VASCULAR CATHETERIZATION  01/02/2016  Procedure: Lower Extremity Intervention;  Surgeon: Annice Needy, MD;  Location: [facility];  Service: Cardiovascular;;   THROMBECTOMY FEMORAL ARTERY Left 04/26/2015   Procedure: THROMBECTOMY FEMORAL ARTERY;  Surgeon: Annice Needy, MD;  Location: [facility];  Service: Vascular;  Laterality: Left;    Home Medications: Prior to Admission medications   Medication Sig Start Date End Date Taking? Authorizing Provider  albuterol (VENTOLIN HFA) 108 (90 Base) MCG/ACT inhaler INHALE 2 PUFFS BY MOUTH EVERY 6 HOURS AS NEEDED  FOR WHEEZING FOR SHORTNESS OF BREATH 01/21/23  Yes Abernathy, Arlyss Repress, NP  aspirin EC 81 MG EC tablet Take 1 tablet (81 mg total) by mouth daily. 04/29/15  Yes Annice Needy, MD  atorvastatin (LIPITOR) 80 MG tablet Take 1 tablet (80 mg total) by mouth daily. 04/10/23  Yes Abernathy, Arlyss Repress, NP  budesonide-formoterol (SYMBICORT) 80-4.5 MCG/ACT inhaler INHALE 2 PUFFS BY MOUTH ONCE DAILY 11/23/22  Yes Abernathy, Alyssa, NP  ezetimibe (ZETIA) 10 MG tablet Take 1 tablet (10 mg total) by mouth daily. 04/10/23  Yes Abernathy, Arlyss Repress, NP  ferrous sulfate 325 (65 FE) MG tablet Take 325 mg by mouth daily.   Yes [provider]  folic acid (FOLVITE) 1 MG tablet Take 1 tablet (1 mg total) by mouth daily. 06/21/23  Yes Lewanda Rife, MD  gabapentin (NEURONTIN) 100 MG capsule Take 1 capsule (100 mg total) by mouth 3 (three) times daily. 06/20/23  Yes Lewanda Rife, MD  losartan (COZAAR) 50 MG tablet Take 2 tablets (100 mg total) by mouth daily. 04/10/23  Yes Abernathy, Arlyss Repress, NP  metoprolol succinate (TOPROL-XL) 50 MG 24 hr tablet TAKE 1 TABLET BY MOUTH ONCE DAILY WITH  OR  IMMEDIATELY  FOLLOWING  A  MEAL Patient taking differently: Take 50 mg by mouth daily. WITH  OR  IMMEDIATELY  FOLLOWING  A  MEAL 01/18/23  Yes Adrian Blackwater A, MD  mirtazapine (REMERON) 15 MG tablet Take 1 tablet (15 mg total) by mouth at bedtime. 04/10/23  Yes Abernathy, Arlyss Repress, NP  Multiple Vitamin (MULTIVITAMIN WITH MINERALS) TABS tablet Take 1 tablet by mouth daily. 06/21/23  Yes Lewanda Rife, MD  nicotine (NICODERM CQ - DOSED IN MG/24 HOURS) 14 mg/24hr patch Place 1 patch (14 mg total) onto the skin daily. 06/21/23  Yes Lewanda Rife, MD  oxyCODONE 10 MG TABS Take 1 tablet (10 mg total) by mouth every 6 (six) hours as needed for moderate pain (pain score 4-6). 06/20/23  Yes Lewanda Rife, MD  OXYGEN Inhale 2.5 L/hr into the lungs as needed.   Yes [provider]  thiamine (VITAMIN B-1) 100 MG tablet Take 1 tablet  (100 mg total) by mouth daily. 06/21/23  Yes Lewanda Rife, MD    Allergies: Allergies  Allergen Reactions   Aspirin Nausea And Vomiting and Other (See Comments)    Pt states aspirin makes her cramp and have to use coated kind.   Tramadol    Zyrtec [Cetirizine]     Social History:  reports that she has been smoking cigarettes. She has been exposed to tobacco smoke. She has never used smokeless tobacco. She reports current alcohol use. She reports that she does not use drugs.   Family History: Family History  Problem Relation Age of Onset   Hypertension Mother    Hypertension Father     Review of Systems: Review of Systems  Constitutional:  Negative for chills and fever.  HENT:  Negative for hearing loss.   Respiratory:  Negative for shortness of  breath.   Cardiovascular:  Negative for chest pain.  Gastrointestinal:  Positive for abdominal pain. Negative for nausea and vomiting.  Genitourinary:  Negative for dysuria.  Musculoskeletal:  Negative for myalgias.  Skin:  Negative for rash.  Neurological:  Negative for dizziness.  Psychiatric/Behavioral:  Negative for depression.     Physical Exam BP (!) 140/78   Pulse 77   Temp 98 F (36.7 C)   Ht 5\' 4"  (1.626 m)   Wt 106 lb 6.4 oz (48.3 kg)   LMP  (LMP Unknown)   SpO2 100%   BMI 18.26 kg/m  CONSTITUTIONAL: No acute distress, well nourished. HEENT:  Normocephalic, atraumatic, extraocular motion intact. NECK: Trachea is midline, and there is no jugular venous distension.  RESPIRATORY:  Lungs are clear, and breath sounds are equal bilaterally. Normal respiratory effort without pathologic use of accessory muscles. CARDIOVASCULAR: Heart is regular without murmurs, gallops, or rubs. GI: The abdomen is soft, non-distended, with tenderness to palpation in the epigastric region.  There is a likely port site in subxyphoid location which a bulging that is tender to palpation, incarcerated, consistent with a hernia.  At the  umbilicus, there is no tenderness and no palpable bulging or defect.  MUSCULOSKELETAL:  Normal muscle strength and tone in all four extremities.  No peripheral edema or cyanosis. SKIN: Skin turgor is normal. There are no pathologic skin lesions.  NEUROLOGIC:  Motor and sensation is grossly normal.  Cranial nerves are grossly intact. PSYCH:  Alert and oriented to person, place and time. Affect is normal.  Laboratory Analysis: Labs from 06/11/23: Na 140, K 3.3, Cl 103, CO2 22, BUN 16, Cr 0.9.  LFTs within normal.  WBC 5.5, Hgb 12.3, Hct 37.2, Plt 201.  Imaging: Ultrasound upper abdomen on 05/15/23: IMPRESSION: 1. Irregular, heterogeneous mass in the subcutaneous soft tissues of the midline upper abdomen. On review of the prior CT scan, there is a subtle fascial defect in the midline upper abdomen, above a small fat containing umbilical hernia. This would correspond in location to the current palpable mass. Although the etiology of this lump is not definitive, it is suspected to be herniated peritoneal fat from a small midline hernia. This could be further assessed, if desired clinically, with MRI.  Assessment and Plan: This is a 67 y.o. female with an incarcerated incisional hernia.  --Discussed with the patient that on exam, there is an incarcerated incisional hernia in the epigastric area, likely what was a previous port site for her cholecystectomy.  Although there is mention on her imaging of an umbilical hernia, there is no bulging or palpable defect on exam.  Her incisional hernia is tender and incarcerated. --Discussed with her that we can help with repair of her hernia.  Discussed with her the option for an open incisional hernia repair.  Reviewed the surgery at length with her including the planned incision, risks of bleeding, infection, injury to surrounding structures, that this would be an outpatient surgery, the possibility of mesh, pain control, activity restrictions, and she's  willing to proceed. --Will schedule her for surgery on 07/25/23.  All of her questions have been answered.  Will also send for medical clearance.  Will need to hold her ASA with last dose on 07/19/23.  I spent 55 minutes dedicated to the care of this patient on the date of this encounter to include pre-visit review of records, face-to-face time with the patient discussing diagnosis and management, and any post-visit coordination of care.   Slater  Aleen Campi, MD [facility]

## 2023-07-08 NOTE — Progress Notes (Addendum)
Request for Pulmonary Clearance has been faxed to Dr Merrie Roof at [facility].

## 2023-07-08 NOTE — Patient Instructions (Signed)
You will need to stop your Aspirin 5 days prior to your surgery.  We are looking at doing surgery on November 21st. With this date your last dose would be November 15th.  You have requested to have a Ventral Hernia Repair. This will be done by Dr Aleen Campi at [facility]. Please see your (BLUE) Pre-care sheet for more information. Our surgery scheduler will call you to look at surgery dates and to go over surgery information.   You will need to arrange to be out of work for approximately 1-2 weeks and then you may return with a lifting restriction for 4 more weeks. If you have FMLA or Disability paperwork that needs to be filled out, please have your company fax your paperwork to [phone] or you may drop this by either office. This paperwork will be filled out within 3 days after your surgery has been completed.     Ventral Hernia A ventral hernia (also called an incisional hernia) is a hernia that occurs at the site of a previous surgical cut (incision) in the abdomen. The abdominal wall spans from your lower chest down to your pelvis. If the abdominal wall is weakened from a surgical incision, a hernia can occur. A hernia is a bulge of bowel or muscle tissue pushing out on the weakened part of the abdominal wall. Ventral hernias can get bigger from straining or lifting. Obese and older people are at higher risk for a ventral hernia. People who develop infections after surgery or require repeat incisions at the same site on the abdomen are also at increased risk. CAUSES  A ventral hernia occurs because of weakness in the abdominal wall at an incision site.  SYMPTOMS  Common symptoms include: A visible bulge or lump on the abdominal wall. Pain or tenderness around the lump. Increased discomfort if you cough or make a sudden movement. If the hernia has blocked part of the intestine, a serious complication can occur (incarcerated or strangulated hernia). This can become a problem that requires emergency  surgery because the blood flow to the blocked intestine may be cut off. Symptoms may include: Feeling sick to your stomach (nauseous). Throwing up (vomiting). Stomach swelling (distention) or bloating. Fever. Rapid heartbeat. DIAGNOSIS  Your health care provider will take a medical history and perform a physical exam. Various tests may be ordered, such as: Blood tests. Urine tests. Ultrasonography. X-rays. Computed tomography (CT). TREATMENT  Watchful waiting may be all that is needed for a smaller hernia that does not cause symptoms. Your health care provider may recommend the use of a supportive belt (truss) that helps to keep the abdominal wall intact. For larger hernias or those that cause pain, surgery to repair the hernia is usually recommended. If a hernia becomes strangulated, emergency surgery needs to be done right away. HOME CARE INSTRUCTIONS Avoid putting pressure or strain on the abdominal area. Avoid heavy lifting. Use good body positioning for physical tasks. Ask your health care provider about proper body positioning. Use a supportive belt as directed by your health care provider. Maintain a healthy weight. Eat foods that are high in fiber, such as whole grains, fruits, and vegetables. Fiber helps prevent difficult bowel movements (constipation). Drink enough fluids to keep your urine clear or pale yellow. Follow up with your health care provider as directed. SEEK MEDICAL CARE IF:  Your hernia seems to be getting larger or more painful. SEEK IMMEDIATE MEDICAL CARE IF:  You have abdominal pain that is sudden and sharp. Your pain  becomes severe. You have repeated vomiting. You are sweating a lot. You notice a rapid heartbeat. You develop a fever. MAKE SURE YOU:  Understand these instructions. Will watch your condition. Will get help right away if you are not doing well or get worse.     Open Ventral Hernia Repair Open ventral hernia repair is a surgery to fix a  ventral hernia. A ventral hernia,  is a bulge of body tissue or intestines that pushes through the front part of the abdomen. This can happen if the connective tissue covering the muscles over the abdomen has a weak spot or is torn because of a surgical cut (incision) from a previous surgery. A ventral hernia repair is often done soon after diagnosis to stop the hernia from getting bigger, becoming uncomfortable, or becoming an emergency. This surgery usually takes about 2 hours, but the time can vary greatly.  LET [facility] CARE PROVIDER KNOW ABOUT: Any allergies you have. All medicines you are taking, including steroids, vitamins, herbs, eye drops, creams, and over-the-counter medicines. Previous problems you or members of your family have had with the use of anesthetics. Any blood disorders you have. Previous surgeries you have had. Medical conditions you have.  RISKS AND COMPLICATIONS  Generally, Open ventral hernia repair is a safe procedure. However, as with any surgical procedure, problems can occur. Possible problems include: Bleeding. Trouble passing urine or having a bowel movement after the surgery. Infection. Pneumonia. Blood clots. Pain in the area of the hernia. A bulge in the area of the hernia that may be caused by a collection of fluid. Injury to intestines or other structures in the abdomen. Return of the hernia after surgery.  BEFORE THE PROCEDURE  You may need to have blood tests, urine tests, a chest X-ray, or an electrocardiogram done before the day of the surgery. Ask your health care provider about changing or stopping your regular medicines. This is especially important if you are taking diabetes medicines or blood thinners. You may need to wash with a special type of germ-killing soap. Do not eat or drink anything after midnight the night before the procedure or as directed by your health care provider. Make plans to have someone drive you home after the  procedure.  PROCEDURE  Small monitors will be put on your body. They are used to check your heart, blood pressure, and oxygen level. An IV access tube will be put into a vein in your hand or arm. Fluids and medicine will flow directly into your body through the IV tube. You will be given medicine that makes you go to sleep (general anesthetic). Your abdomen will be cleaned with a special soap to kill any germs on your skin. Once you are asleep, a moderate - large size incision will be made in your abdomen. The size of incision depends on how large your hernia is. Your surgeon puts the tissue or intestines that formed the hernia back in place. A screen-like patch (mesh) is used to close the hernia. This helps make the area stronger. Stitches, tacks, or staples are used to keep the mesh in place. Medicine and a bandage (dressing) or skin glue will be put over the incision.  AFTER THE PROCEDURE  You will stay in a recovery area until the anesthetic wears off. Your blood pressure and pulse will be checked often. You may be able to go home the same day or may need to stay in the hospital for 1-2 days after surgery. Your  surgeon will decide when you can go home depending upon your recovery. You may feel some pain. You will be given medicine for pain. You will be urged to do breathing exercises that involve taking deep breaths. This helps prevent a lung infection after a surgery. You may have to wear compression stockings while you are in the hospital. These stockings help keep blood clots from forming in your legs.   This information is not intended to replace advice given to you by your health care provider. Make sure you discuss any questions you have with your health care provider.   Document Released: 08/06/2012 Document Revised: 08/25/2013 Document Reviewed: 08/06/2012 [employer] Interactive Patient Education [employer]

## 2023-07-09 ENCOUNTER — Telehealth: Payer: Self-pay | Admitting: Nurse Practitioner

## 2023-07-09 NOTE — Telephone Encounter (Addendum)
Received surgical clearance from [facility]. Gave to Lauren-Toni

## 2023-07-11 ENCOUNTER — Telehealth: Payer: Self-pay | Admitting: Nurse Practitioner

## 2023-07-11 NOTE — Telephone Encounter (Signed)
Faxed surgical clearance back to  Surgical notifying them patient sees [facility]-Toni

## 2023-07-11 NOTE — Telephone Encounter (Signed)
Lvm to schedule office visit for surgical clearance-[name]

## 2023-07-12 ENCOUNTER — Ambulatory Visit: Payer: 59 | Admitting: Surgery

## 2023-07-12 NOTE — Telephone Encounter (Signed)
Another message is left for patient to call the office.

## 2023-07-12 NOTE — Telephone Encounter (Signed)
Called patient again, she and daughter now informed of all dates regarding surgery.

## 2023-07-16 ENCOUNTER — Telehealth: Payer: Self-pay | Admitting: Nurse Practitioner

## 2023-07-16 ENCOUNTER — Ambulatory Visit (INDEPENDENT_AMBULATORY_CARE_PROVIDER_SITE_OTHER): Payer: 59 | Admitting: Cardiovascular Disease

## 2023-07-16 ENCOUNTER — Ambulatory Visit (INDEPENDENT_AMBULATORY_CARE_PROVIDER_SITE_OTHER): Payer: 59 | Admitting: Nurse Practitioner

## 2023-07-16 ENCOUNTER — Encounter: Payer: Self-pay | Admitting: Nurse Practitioner

## 2023-07-16 ENCOUNTER — Encounter: Payer: Self-pay | Admitting: Cardiovascular Disease

## 2023-07-16 VITALS — BP 138/84 | HR 83 | Temp 98.2°F | Resp 16 | Ht 64.0 in | Wt 108.8 lb

## 2023-07-16 VITALS — BP 131/82 | HR 85 | Ht 64.0 in | Wt 107.6 lb

## 2023-07-16 DIAGNOSIS — Z01810 Encounter for preprocedural cardiovascular examination: Secondary | ICD-10-CM | POA: Diagnosis not present

## 2023-07-16 DIAGNOSIS — Z01818 Encounter for other preprocedural examination: Secondary | ICD-10-CM

## 2023-07-16 DIAGNOSIS — I10 Essential (primary) hypertension: Secondary | ICD-10-CM

## 2023-07-16 DIAGNOSIS — E782 Mixed hyperlipidemia: Secondary | ICD-10-CM | POA: Diagnosis not present

## 2023-07-16 DIAGNOSIS — I2510 Atherosclerotic heart disease of native coronary artery without angina pectoris: Secondary | ICD-10-CM

## 2023-07-16 DIAGNOSIS — I2583 Coronary atherosclerosis due to lipid rich plaque: Secondary | ICD-10-CM | POA: Diagnosis not present

## 2023-07-16 DIAGNOSIS — F17219 Nicotine dependence, cigarettes, with unspecified nicotine-induced disorders: Secondary | ICD-10-CM | POA: Diagnosis not present

## 2023-07-16 DIAGNOSIS — J4489 Other specified chronic obstructive pulmonary disease: Secondary | ICD-10-CM

## 2023-07-16 DIAGNOSIS — Z0000 Encounter for general adult medical examination without abnormal findings: Secondary | ICD-10-CM

## 2023-07-16 DIAGNOSIS — I70213 Atherosclerosis of native arteries of extremities with intermittent claudication, bilateral legs: Secondary | ICD-10-CM | POA: Diagnosis not present

## 2023-07-16 NOTE — Progress Notes (Signed)
[facility] [address] [place], [place] [address]  Internal MEDICINE  Office Visit Note  Patient Name: [name]  [id number]  [id number]  Date of Service: 07/16/2023  Chief Complaint  Patient presents with   Hypertension   Hyperlipidemia   Medicare Wellness    HPI Ahavah presents for an annual well visit and physical exam.  Well-appearing 67 y.o. female/female with hypertension, CAD, COPD, tobacco use disorder, alcohol use disorder.  Routine CRC screening: due in 2026 Routine mammogram: due in november DEXA scan: done 2 years ago Labs: has had labs done in October. New or worsening pain: none  --Smoking more than a pack a day, this is an increase from last year. Not ready to quit when discussed with patient, reports that she has tried before many times.  --History of drinking alcohol daily, she was recently hospitalized this year for alcohol intoxication. --Reports she has cut back on her alcohol use. Verbalizes understanding of the risks of alcohol use and the importance of weaning off and quitting.  --Presurgical clearance for hernia repair      07/16/2023   10:25 AM 05/25/2022   10:25 AM 12/15/2019   10:38 AM  MMSE - Mini Mental State Exam  Orientation to time 5 5 5   Orientation to Place 5 5 5   Registration 3 3 3   Attention/ Calculation 5 5 5   Recall 3 3 3   Language- name 2 objects 2 2 2   Language- repeat 1 1 1   Language- follow 3 step command 3 3 3   Language- read & follow direction 1 1 1   Write a sentence 0 0 1  Copy design 1 1 1   Total score 29 29 30     Functional Status Survey: Is the patient deaf or have difficulty hearing?: No Does the patient have difficulty seeing, even when wearing glasses/contacts?: No Does the patient have difficulty concentrating, remembering, or making decisions?: Yes Does the patient have difficulty walking or climbing stairs?: No Does the patient have difficulty dressing or bathing?: No Does the patient have  difficulty doing errands alone such as visiting a doctor's office or shopping?: No     05/25/2022   10:21 AM 11/23/2022   10:38 AM 05/31/2023   10:25 AM 06/26/2023    1:50 PM 07/16/2023   10:24 AM  Fall Risk  Falls in the past year? 0 0 0 0 0  Was there an injury with Fall? 0 0   0  Fall Risk Category Calculator 0 0   0  Fall Risk Category (Retired) Low      (RETIRED) Patient Fall Risk Level Low fall risk      Patient at Risk for Falls Due to No Fall Risks No Fall Risks  No Fall Risks No Fall Risks  Fall risk Follow up Falls evaluation completed Falls evaluation completed Falls evaluation completed Falls evaluation completed Falls evaluation completed       07/16/2023   11:49 AM  Depression screen PHQ 2/9  Decreased Interest 3  Down, Depressed, Hopeless 1  PHQ - 2 Score 4  Altered sleeping 0  Tired, decreased energy 1  Change in appetite 2  Feeling bad or failure about yourself  1  Trouble concentrating 0  Moving slowly or fidgety/restless 0  Suicidal thoughts 0  PHQ-9 Score 8  Difficult doing work/chores Not difficult at all       Current Medication: Outpatient Encounter Medications as of 07/16/2023  Medication Sig Note   albuterol (VENTOLIN HFA) 108 (  90 Base) MCG/ACT inhaler INHALE 2 PUFFS BY MOUTH EVERY 6 HOURS AS NEEDED FOR WHEEZING FOR SHORTNESS OF BREATH    aspirin EC 81 MG EC tablet Take 1 tablet (81 mg total) by mouth daily.    atorvastatin (LIPITOR) 80 MG tablet Take 1 tablet (80 mg total) by mouth daily.    budesonide-formoterol (SYMBICORT) 80-4.5 MCG/ACT inhaler INHALE 2 PUFFS BY MOUTH ONCE DAILY (Patient taking differently: Inhale 2 puffs into the lungs 2 (two) times daily as needed (respiratory issues.).)    Cholecalciferol (VITAMIN D-3 PO) Take 1 tablet by mouth daily.    ezetimibe (ZETIA) 10 MG tablet Take 1 tablet (10 mg total) by mouth daily.    ferrous sulfate 325 (65 FE) MG tablet Take 325 mg by mouth daily.    folic acid (FOLVITE) 1 MG tablet Take 1  tablet (1 mg total) by mouth daily.    gabapentin (NEURONTIN) 100 MG capsule Take 1 capsule (100 mg total) by mouth 3 (three) times daily. 07/15/2023: On hold per patient needs refills   losartan (COZAAR) 50 MG tablet Take 2 tablets (100 mg total) by mouth daily.    metoprolol succinate (TOPROL-XL) 50 MG 24 hr tablet TAKE 1 TABLET BY MOUTH ONCE DAILY WITH  OR  IMMEDIATELY  FOLLOWING  A  MEAL    Multiple Vitamin (MULTIVITAMIN WITH MINERALS) TABS tablet Take 1 tablet by mouth daily.    nicotine (NICODERM CQ - DOSED IN MG/24 HOURS) 14 mg/24hr patch Place 1 patch (14 mg total) onto the skin daily.    OXYGEN Inhale 2.5 L/hr into the lungs as needed.    thiamine (VITAMIN B-1) 100 MG tablet Take 1 tablet (100 mg total) by mouth daily.    No facility-administered encounter medications on file as of 07/16/2023.    Surgical History: Past Surgical History:  Procedure Laterality Date   ABDOMINAL HYSTERECTOMY     APPENDECTOMY     CARDIAC CATHETERIZATION     CARDIAC SURGERY     CHOLECYSTECTOMY     COLONOSCOPY  12/15/11   OH->bleeding internal hemorrhoids, otherwise normal   COLONOSCOPY WITH PROPOFOL N/A 04/12/2015   Procedure: COLONOSCOPY WITH PROPOFOL;  Surgeon: Midge Minium, MD;  Location: [facility] ENDOSCOPY;  Service: Endoscopy;  Laterality: N/A;   COLONOSCOPY WITH PROPOFOL N/A 01/05/2020   Procedure: COLONOSCOPY WITH PROPOFOL;  Surgeon: Midge Minium, MD;  [facility]COPY;  Service: Endoscopy;  Laterality: N/A;   ENDARTERECTOMY FEMORAL Left 04/26/2015   Procedure: ENDARTERECTOMY FEMORAL;  Surgeon: Annice Needy, MD;  Location: [facility] ORS;  Service: Vascular;  Laterality: Left;   ENDOVASCULAR STENT INSERTION Bilateral    Legs   FLEXIBLE BRONCHOSCOPY N/A 02/02/2015   Procedure: FLEXIBLE BRONCHOSCOPY;  Surgeon: Yevonne Pax, MD;  Location: [facility] ORS;  Service: Pulmonary;  Laterality: N/A;   HEMORRHOID SURGERY     PERIPHERAL VASCULAR CATHETERIZATION Left 04/25/2015   Procedure: Lower Extremity Angiography;   Surgeon: Annice Needy, MD;  Location: [facility] INVASIVE CV LAB;  Service: Cardiovascular;  Laterality: Left;   PERIPHERAL VASCULAR CATHETERIZATION Left 04/25/2015   Procedure: Lower Extremity Intervention;  Surgeon: Annice Needy, MD;  Location: [facility] INVASIVE CV LAB;  Service: Cardiovascular;  Laterality: Left;   PERIPHERAL VASCULAR CATHETERIZATION Left 01/02/2016   Procedure: Lower Extremity Angiography;  Surgeon: Annice Needy, MD;  Location: [facility] INVASIVE CV LAB;  Service: Cardiovascular;  Laterality: Left;   PERIPHERAL VASCULAR CATHETERIZATION  01/02/2016   Procedure: Lower Extremity Intervention;  Surgeon: Annice Needy, MD;  Location: [facility]  INVASIVE CV LAB;  Service: Cardiovascular;;   THROMBECTOMY FEMORAL ARTERY Left 04/26/2015   Procedure: THROMBECTOMY FEMORAL ARTERY;  Surgeon: Annice Needy, MD;  Location: [facility] ORS;  Service: Vascular;  Laterality: Left;    Medical History: Past Medical History:  Diagnosis Date   Allergy    Environmental   Arthritis    Asthma    Carotid artery stenosis    Carotid stenosis    COPD (chronic obstructive pulmonary disease) (HCC)    Coronary artery disease    Coronary artery disease    Hemorrhoids    Hyperlipidemia    Hypertension    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(HCC)    negative cardiac cath   Nicotine addiction    Peripheral vascular disease (HCC)    Pneumonia    Shortness of breath dyspnea    Tobacco abuse     Family History: Family History  Problem Relation Age of Onset   Hypertension Mother    Hypertension Father     Social History   Socioeconomic History   Marital status: Single    Spouse name: Not on file   Number of children: Not on file   Years of education: Not on file   Highest education level: Not on file  Occupational History   Not on file  Tobacco Use   Smoking status: Every Day    Current packs/day: 1.00    Types: Cigarettes    Passive exposure: Past   Smokeless tobacco: Never  Vaping Use   Vaping status: Never Used  Substance and  Sexual Activity   Alcohol use: Yes    Alcohol/week: 0.0 standard drinks of alcohol    Comment: weekends, couple beers, pint of liquer only on weekends.  Last drank approx 1 month ago.   Drug use: No   Sexual activity: Not Currently  Other Topics Concern   Not on file  Social History Narrative   Lives with family    Social Determinants of Health   Financial Resource Strain: Low Risk  (03/21/2023)   Received from [facility]   Overall Financial Resource Strain (CARDIA)    Difficulty of Paying Living Expenses: Not hard at all  Food Insecurity: No Food Insecurity (06/12/2023)   Hunger Vital Sign    Worried About Running Out of Food in the Last Year: Never true    Ran Out of Food in the Last Year: Never true  Transportation Needs: No Transportation Needs (06/12/2023)   PRAPARE - Administrator, Civil Service (Medical): No    Lack of Transportation (Non-Medical): No  Physical Activity: Inactive (04/04/2018)   Exercise Vital Sign    Days of Exercise per Week: 0 days    Minutes of Exercise per Session: 0 min  Stress: No Stress Concern Present (04/04/2018)   [employer] of Occupational Health - Occupational Stress Questionnaire    Feeling of Stress : Only a little  Social Connections: Moderately Isolated (04/04/2018)   Social Connection and Isolation Panel [NHANES]    Frequency of Communication with Friends and Family: Once a week    Frequency of Social Gatherings with Friends and Family: Once a week    Attends Religious Services: 1 to 4 times per year    Active Member of [employer] or Organizations: No    Attends Banker Meetings: Never    Marital Status: Never married  Intimate Partner Violence: Not At Risk (06/12/2023)   Humiliation, Afraid, Rape, and Kick questionnaire    Fear of Current or  Ex-Partner: No    Emotionally Abused: No    Physically Abused: No    Sexually Abused: No      Review of Systems  Constitutional:  Negative for activity change,  appetite change, chills, fatigue, fever and unexpected weight change.  HENT: Negative.  Negative for congestion, ear pain, rhinorrhea, sore throat and trouble swallowing.   Eyes: Negative.   Respiratory: Negative.  Negative for cough, chest tightness, shortness of breath and wheezing.   Cardiovascular: Negative.  Negative for chest pain.  Gastrointestinal: Negative.  Negative for abdominal pain, blood in stool, constipation, diarrhea, nausea and vomiting.  Endocrine: Negative.   Genitourinary: Negative.  Negative for difficulty urinating, dysuria, frequency, hematuria and urgency.  Musculoskeletal: Negative.  Negative for arthralgias, back pain, joint swelling, myalgias and neck pain.  Skin: Negative.  Negative for rash and wound.  Allergic/Immunologic: Negative.  Negative for immunocompromised state.  Neurological: Negative.  Negative for dizziness, seizures, numbness and headaches.  Hematological: Negative.   Psychiatric/Behavioral: Negative.  Negative for behavioral problems, self-injury and suicidal ideas. The patient is not nervous/anxious.     Vital Signs: BP 138/84   Pulse 83   Temp 98.2 F (36.8 C)   Resp 16   Ht 5\' 4"  (1.626 m)   Wt 108 lb 12.8 oz (49.4 kg)   LMP  (LMP Unknown)   SpO2 99%   BMI 18.68 kg/m    Physical Exam Vitals reviewed.  Constitutional:      General: She is awake. She is not in acute distress.    Appearance: Normal appearance. She is well-developed, well-groomed and normal weight. She is not ill-appearing or diaphoretic.  HENT:     Head: Normocephalic and atraumatic.     Right Ear: Tympanic membrane, ear canal and external ear normal.     Left Ear: Tympanic membrane, ear canal and external ear normal.     Nose: Nose normal. No congestion or rhinorrhea.     Mouth/Throat:     Mouth: Mucous membranes are moist.     Pharynx: Oropharynx is clear. No oropharyngeal exudate or posterior oropharyngeal erythema.  Eyes:     General: Lids are normal. Vision  grossly intact. Gaze aligned appropriately. No scleral icterus.       Right eye: No discharge.        Left eye: No discharge.     Conjunctiva/sclera: Conjunctivae normal.     Pupils: Pupils are equal, round, and reactive to light.     Funduscopic exam:    Right eye: Red reflex present.        Left eye: Red reflex present. Neck:     Thyroid: No thyromegaly.     Vascular: No JVD.     Trachea: No tracheal deviation.  Cardiovascular:     Rate and Rhythm: Normal rate and regular rhythm.     Pulses: Normal pulses.     Heart sounds: Normal heart sounds, S1 normal and S2 normal. No murmur heard.    No friction rub. No gallop.  Pulmonary:     Effort: Pulmonary effort is normal. No accessory muscle usage or respiratory distress.     Breath sounds: Normal breath sounds and air entry. No stridor. No wheezing or rales.  Chest:     Chest wall: No tenderness.  Breasts:    Breasts are symmetrical.     Right: Normal. No swelling, bleeding, inverted nipple, mass, nipple discharge, skin change or tenderness.     Left: Normal. No swelling, bleeding, inverted nipple, mass,  nipple discharge, skin change or tenderness.  Abdominal:     General: Bowel sounds are normal. There is no distension.     Palpations: Abdomen is soft. There is no shifting dullness, fluid wave, mass or pulsatile mass.     Tenderness: There is no abdominal tenderness. There is no guarding or rebound.  Musculoskeletal:        General: No tenderness or deformity. Normal range of motion.     Cervical back: Normal range of motion and neck supple.     Right lower leg: No edema.     Left lower leg: No edema.  Lymphadenopathy:     Cervical: No cervical adenopathy.     Upper Body:     Right upper body: No supraclavicular, axillary or pectoral adenopathy.     Left upper body: No supraclavicular, axillary or pectoral adenopathy.  Skin:    General: Skin is warm and dry.     Capillary Refill: Capillary refill takes less than 2 seconds.      Coloration: Skin is not pale.     Findings: No erythema or rash.  Neurological:     Mental Status: She is alert and oriented to person, place, and time.     Cranial Nerves: No cranial nerve deficit.     Motor: No abnormal muscle tone.     Coordination: Coordination normal.     Deep Tendon Reflexes: Reflexes are normal and symmetric.  Psychiatric:        Mood and Affect: Mood normal.        Behavior: Behavior normal. Behavior is cooperative.        Thought Content: Thought content normal.        Judgment: Judgment normal.       Assessment/Plan: 1. Encounter for subsequent annual wellness visit (AWV) in Medicare patient Age-appropriate preventive screenings and vaccinations discussed, annual physical exam completed. Routine labs for health maintenance up to date. Marland Kitchen PHM updated.   2. Essential hypertension Stable, continue current medications as prescribed  3. Mixed hyperlipidemia On max dose atorvastatin, continue as prescribed   4. Obstructive chronic bronchitis without exacerbation (HCC) On symbicort, sees [facility] pulmonology   5. Coronary artery disease due to lipid rich plaque Taking max dose atorvastatin, seescardiology  6. Preoperative clearance Cleared for surgery, moderate risk based on respiratory status.      General Counseling: Brande verbalizes understanding of the findings of todays visit and agrees with plan of treatment. I have discussed any further diagnostic evaluation that may be needed or ordered today. We also reviewed her medications today. she has been encouraged to call the office with any questions or concerns that should arise related to todays visit.    No orders of the defined types were placed in this encounter.   No orders of the defined types were placed in this encounter.   Return in about 6 months (around 01/13/2024) for F/U, Persephonie Hegwood PCP.   Total time spent:30 Minutes Time spent includes review of chart, medications, test results, and  follow up plan with the patient.   [place] Controlled Substance Database was reviewed by me.  This patient was seen by Sallyanne Kuster, FNP-C in collaboration with Dr. Beverely Risen as a part of collaborative care agreement.  Allyse Fregeau R. Tedd Sias, MSN, FNP-C Internal medicine

## 2023-07-16 NOTE — Progress Notes (Signed)
Cardiology Office Note   Date:  07/16/2023   ID:  [name], DOB 09-12-1955, MRN [id number]  PCP:  Sallyanne Kuster, NP  Cardiologist:  Adrian Blackwater, MD      History of Present Illness: [name] is a 67 y.o. female who presents for No chief complaint on file.   Has abdominal pain, getting hernia surgery      Past Medical History:  Diagnosis Date   Allergy    Environmental   Arthritis    Asthma    Carotid artery stenosis    Carotid stenosis    COPD (chronic obstructive pulmonary disease) (HCC)    Coronary artery disease    Coronary artery disease    Hemorrhoids    Hyperlipidemia    Hypertension    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(HCC)    negative cardiac cath   Nicotine addiction    Peripheral vascular disease (HCC)    Pneumonia    Shortness of breath dyspnea    Tobacco abuse      Past Surgical History:  Procedure Laterality Date   ABDOMINAL HYSTERECTOMY     APPENDECTOMY     CARDIAC CATHETERIZATION     CARDIAC SURGERY     CHOLECYSTECTOMY     COLONOSCOPY  12/15/11   OH->bleeding internal hemorrhoids, otherwise normal   COLONOSCOPY WITH PROPOFOL N/A 04/12/2015   Procedure: COLONOSCOPY WITH PROPOFOL;  Surgeon: Midge Minium, MD;  Location: [facility] ENDOSCOPY;  Service: Endoscopy;  Laterality: N/A;   COLONOSCOPY WITH PROPOFOL N/A 01/05/2020   Procedure: COLONOSCOPY WITH PROPOFOL;  Surgeon: Midge Minium, MD;  Location: [facility] ENDOSCOPY;  Service: Endoscopy;  Laterality: N/A;   ENDARTERECTOMY FEMORAL Left 04/26/2015   Procedure: ENDARTERECTOMY FEMORAL;  Surgeon: Annice Needy, MD;  Location: [facility] ORS;  Service: Vascular;  Laterality: Left;   ENDOVASCULAR STENT INSERTION Bilateral    Legs   FLEXIBLE BRONCHOSCOPY N/A 02/02/2015   Procedure: FLEXIBLE BRONCHOSCOPY;  Surgeon: Yevonne Pax, MD;  Location: [facility] ORS;  Service: Pulmonary;  Laterality: N/A;   HEMORRHOID SURGERY     PERIPHERAL VASCULAR CATHETERIZATION Left 04/25/2015   Procedure: Lower Extremity Angiography;  Surgeon:  Annice Needy, MD;  Location: [facility] INVASIVE CV LAB;  Service: Cardiovascular;  Laterality: Left;   PERIPHERAL VASCULAR CATHETERIZATION Left 04/25/2015   Procedure: Lower Extremity Intervention;  Surgeon: Annice Needy, MD;  Location: [facility] INVASIVE CV LAB;  Service: Cardiovascular;  Laterality: Left;   PERIPHERAL VASCULAR CATHETERIZATION Left 01/02/2016   Procedure: Lower Extremity Angiography;  Surgeon: Annice Needy, MD;  Location: [facility] INVASIVE CV LAB;  Service: Cardiovascular;  Laterality: Left;   PERIPHERAL VASCULAR CATHETERIZATION  01/02/2016   Procedure: Lower Extremity Intervention;  Surgeon: Annice Needy, MD;  Location: [facility] INVASIVE CV LAB;  Service: Cardiovascular;;   THROMBECTOMY FEMORAL ARTERY Left 04/26/2015   Procedure: THROMBECTOMY FEMORAL ARTERY;  Surgeon: Annice Needy, MD;  Location: [facility] ORS;  Service: Vascular;  Laterality: Left;     Current Outpatient Medications  Medication Sig Dispense Refill   albuterol (VENTOLIN HFA) 108 (90 Base) MCG/ACT inhaler INHALE 2 PUFFS BY MOUTH EVERY 6 HOURS AS NEEDED FOR WHEEZING FOR SHORTNESS OF BREATH 9 g 0   aspirin EC 81 MG EC tablet Take 1 tablet (81 mg total) by mouth daily. 90 tablet 1   atorvastatin (LIPITOR) 80 MG tablet Take 1 tablet (80 mg total) by mouth daily. 90 tablet 3   budesonide-formoterol (SYMBICORT) 80-4.5 MCG/ACT inhaler INHALE 2 PUFFS BY MOUTH ONCE DAILY (Patient taking differently: Inhale 2  puffs into the lungs 2 (two) times daily as needed (respiratory issues.).) 22 g 2   Cholecalciferol (VITAMIN D-3 PO) Take 1 tablet by mouth daily.     ezetimibe (ZETIA) 10 MG tablet Take 1 tablet (10 mg total) by mouth daily. 90 tablet 3   ferrous sulfate 325 (65 FE) MG tablet Take 325 mg by mouth daily.     folic acid (FOLVITE) 1 MG tablet Take 1 tablet (1 mg total) by mouth daily. 15 tablet 0   gabapentin (NEURONTIN) 100 MG capsule Take 1 capsule (100 mg total) by mouth 3 (three) times daily. 90 capsule 0   losartan (COZAAR) 50 MG tablet Take 2  tablets (100 mg total) by mouth daily. 180 tablet 3   metoprolol succinate (TOPROL-XL) 50 MG 24 hr tablet TAKE 1 TABLET BY MOUTH ONCE DAILY WITH  OR  IMMEDIATELY  FOLLOWING  A  MEAL 90 tablet 0   Multiple Vitamin (MULTIVITAMIN WITH MINERALS) TABS tablet Take 1 tablet by mouth daily. 30 tablet 0   nicotine (NICODERM CQ - DOSED IN MG/24 HOURS) 14 mg/24hr patch Place 1 patch (14 mg total) onto the skin daily. (Patient not taking: Reported on 07/15/2023) 28 patch 0   OXYGEN Inhale 2.5 L/hr into the lungs as needed.     thiamine (VITAMIN B-1) 100 MG tablet Take 1 tablet (100 mg total) by mouth daily. 15 tablet 0   No current facility-administered medications for this visit.    Allergies:   Aspirin, Zyrtec [cetirizine], and Tramadol    Social History:   reports that she has been smoking cigarettes. She has been exposed to tobacco smoke. She has never used smokeless tobacco. She reports current alcohol use. She reports that she does not use drugs.   Family History:  family history includes Hypertension in her father and mother.    ROS:     Review of Systems  Constitutional: Negative.   HENT: Negative.    Eyes: Negative.   Respiratory: Negative.    Gastrointestinal: Negative.   Genitourinary: Negative.   Musculoskeletal: Negative.   Skin: Negative.   Neurological: Negative.   Endo/Heme/Allergies: Negative.   Psychiatric/Behavioral: Negative.    All other systems reviewed and are negative.     All other systems are reviewed and negative.    PHYSICAL EXAM: VS:  BP 131/82   Pulse 85   Ht 5\' 4"  (1.626 m)   Wt 107 lb 9.6 oz (48.8 kg)   LMP  (LMP Unknown)   SpO2 97%   BMI 18.47 kg/m  , BMI Body mass index is 18.47 kg/m. Last weight:  Wt Readings from Last 3 Encounters:  07/16/23 107 lb 9.6 oz (48.8 kg)  07/08/23 106 lb 6.4 oz (48.3 kg)  07/01/23 109 lb (49.4 kg)     Physical Exam Constitutional:      Appearance: Normal appearance.  Cardiovascular:     Rate and Rhythm:  Normal rate and regular rhythm.     Heart sounds: Normal heart sounds.  Pulmonary:     Effort: Pulmonary effort is normal.     Breath sounds: Normal breath sounds.  Musculoskeletal:     Right lower leg: No edema.     Left lower leg: No edema.  Neurological:     Mental Status: She is alert.       EKG:   Recent Labs: 06/11/2023: ALT 27; BUN 16; Creatinine, Ser 0.90; Hemoglobin 12.3; Platelets 201; Potassium 3.3; Sodium 140    Lipid Panel  Component Value Date/Time   CHOL 202 (H) 06/18/2023 0707   CHOL 149 05/28/2022 1103   CHOL 147 10/16/2013 0439   TRIG 186 (H) 06/18/2023 0707   TRIG 122 10/16/2013 0439   HDL 61 06/18/2023 0707   HDL 79 05/28/2022 1103   HDL 31 (L) 10/16/2013 0439   CHOLHDL 3.3 06/18/2023 0707   VLDL 37 06/18/2023 0707   VLDL 24 10/16/2013 0439   LDLCALC 104 (H) 06/18/2023 0707   LDLCALC 55 05/28/2022 1103   LDLCALC 92 10/16/2013 0439      Other studies Reviewed: Additional studies/ records that were reviewed today include:  Review of the above records demonstrates:       No data to display            ASSESSMENT AND PLAN:    ICD-10-CM   1. Essential hypertension  I10     2. Cigarette nicotine dependence with nicotine-induced disorder  F17.219     3. Atherosclerosis of native artery of both lower extremities with intermittent claudication (HCC)  I70.213     4. Coronary artery disease due to lipid rich plaque  I25.10    I25.83     5. Preop cardiovascular exam  Z01.810    Low risk for hernia surgery, advise proceeding with surgery. Surgery can be done on asp, but if need to can stop 5 days prior to surgery       Problem List Items Addressed This Visit       Cardiovascular and Mediastinum   Essential hypertension - Primary   Atherosclerosis of native arteries of extremity with intermittent claudication (HCC)   Coronary artery disease due to lipid rich plaque     Nervous and Auditory   Cigarette nicotine dependence with  nicotine-induced disorder   Other Visit Diagnoses     Preop cardiovascular exam       Low risk for hernia surgery, advise proceeding with surgery. Surgery can be done on asp, but if need to can stop 5 days prior to surgery          Disposition:   Return in about 2 months (around 09/15/2023).    Total time spent: 30 minutes  Signed,  Adrian Blackwater, MD  07/16/2023 9:25 AM    [facility]

## 2023-07-16 NOTE — Telephone Encounter (Signed)
Surgical clearance completed. Faxed back to [facility]; [phone]. Scanned-Toni

## 2023-07-16 NOTE — Progress Notes (Unsigned)
Medical Clearance received from Sallyanne Kuster, NP. The patient is cleared at Medium risk for surgery.

## 2023-07-18 ENCOUNTER — Other Ambulatory Visit: Payer: Self-pay

## 2023-07-18 ENCOUNTER — Telehealth: Payer: Self-pay

## 2023-07-18 ENCOUNTER — Encounter
Admission: RE | Admit: 2023-07-18 | Discharge: 2023-07-18 | Disposition: A | Discharge status: Home / Self Care | Payer: 59 | Source: Ambulatory Visit | Attending: Surgery | Admitting: Surgery

## 2023-07-18 HISTORY — DX: Depression, unspecified: F32.A

## 2023-07-18 HISTORY — DX: Headache, unspecified: R51.9

## 2023-07-18 HISTORY — DX: Other psychoactive substance abuse, uncomplicated: F19.10

## 2023-07-18 HISTORY — DX: Malignant (primary) neoplasm, unspecified: C80.1

## 2023-07-18 NOTE — Patient Instructions (Addendum)
Your procedure is scheduled on: 07/25/23 - Thursday Report to the Registration Desk on the 1st floor of the [facility]. To find out your arrival time, please call [phone] between 1PM - 3PM on: 07/24/23 - Wednesday If your arrival time is 6:00 am, do not arrive before that time as the [facility] entrance doors do not open until 6:00 am.  REMEMBER: Instructions that are not followed completely may result in serious medical risk, up to and including death; or upon the discretion of your surgeon and anesthesiologist your surgery may need to be rescheduled.  Do not eat food after midnight the night before surgery.  No gum chewing or hard candies.  You may however, drink CLEAR liquids up to 2 hours before you are scheduled to arrive for your surgery. Do not drink anything within 2 hours of your scheduled arrival time.  Clear liquids include: - water  - apple juice without pulp - gatorade (not RED colors) - black coffee or tea (Do NOT add milk or creamers to the coffee or tea) Do NOT drink anything that is not on this list.  One week prior to surgery: Stop Anti-inflammatories (NSAIDS) such as Advil, Aleve, Ibuprofen, Motrin, Naproxen, Naprosyn and Aspirin based products such as Excedrin, Goody's Powder, BC Powder. You may take Tylenol if needed for pain up until the day of surgery.  Stop ANY OVER THE COUNTER supplements until after surgery : VITAMIN D-3 , folic acid , Multiple Vitamin , thiamine .  Hold aspirin EC - last dose 07/19/23.  HOLD losartan (COZAAR) on the day of surgery  ON THE DAY OF SURGERY ONLY TAKE THESE MEDICATIONS WITH SIPS OF WATER:  ezetimibe (ZETIA)  atorvastatin (LIPITOR)  metoprolol succinate   Use inhalers SYMBICORT and albuterol on the day of surgery and bring to the hospital.  No Alcohol for 24 hours before or after surgery.  No Smoking including e-cigarettes for 24 hours before surgery.  No chewable tobacco products for at least 6 hours before  surgery.  No nicotine patches on the day of surgery.  Do not use any "recreational" drugs for at least a week (preferably 2 weeks) before your surgery.  Please be advised that the combination of cocaine and anesthesia may have negative outcomes, up to and including death. If you test positive for cocaine, your surgery will be cancelled.  On the morning of surgery brush your teeth with toothpaste and water, you may rinse your mouth with mouthwash if you wish. Do not swallow any toothpaste or mouthwash.  Use CHG Soap or wipes as directed on instruction sheet.  Do not wear jewelry, make-up, hairpins, clips or nail polish.  For welded (permanent) jewelry: bracelets, anklets, waist bands, etc.  Please have this removed prior to surgery.  If it is not removed, there is a chance that hospital personnel will need to cut it off on the day of surgery.  Do not wear lotions, powders, or perfumes.   Do not shave body hair from the neck down 48 hours before surgery.  Contact lenses, hearing aids and dentures may not be worn into surgery.  Do not bring valuables to the hospital. [facility] is not responsible for any missing/lost belongings or valuables.   Notify your doctor if there is any change in your medical condition (cold, fever, infection).  Wear comfortable clothing (specific to your surgery type) to the hospital.  After surgery, you can help prevent lung complications by doing breathing exercises.  Take deep breaths and  cough every 1-2 hours. Your doctor may order a device called an Incentive Spirometer to help you take deep breaths. When coughing or sneezing, hold a pillow firmly against your incision with both hands. This is called "splinting." Doing this helps protect your incision. It also decreases belly discomfort.  If you are being admitted to the hospital overnight, leave your suitcase in the car. After surgery it may be brought to your room.  In case of increased patient census,  it may be necessary for you, the patient, to continue your postoperative care in the Same Day Surgery department.  If you are being discharged the day of surgery, you will not be allowed to drive home. You will need a responsible individual to drive you home and stay with you for 24 hours after surgery.   If you are taking public transportation, you will need to have a responsible individual with you.  Please call the Pre-admissions Testing Dept. at [phone] if you have any questions about these instructions.  Surgery Visitation Policy:  Patients having surgery or a procedure may have two visitors.  Children under the age of 72 must have an adult with them who is not the patient.  Inpatient Visitation:    Visiting hours are 7 a.m. to 8 p.m. Up to four visitors are allowed at one time in a patient room. The visitors may rotate out with other people during the day.  One visitor age 28 or older may stay with the patient overnight and must be in the room by 8 p.m.     Preparing for Surgery with CHLORHEXIDINE GLUCONATE (CHG) Soap  Chlorhexidine Gluconate (CHG) Soap  o An antiseptic cleaner that kills germs and bonds with the skin to continue killing germs even after washing  o Used for showering the night before surgery and morning of surgery  Before surgery, you can play an important role by reducing the number of germs on your skin.  CHG (Chlorhexidine gluconate) soap is an antiseptic cleanser which kills germs and bonds with the skin to continue killing germs even after washing.  Please do not use if you have an allergy to CHG or antibacterial soaps. If your skin becomes reddened/irritated stop using the CHG.  1. Shower the NIGHT BEFORE SURGERY and the MORNING OF SURGERY with CHG soap.  2. If you choose to wash your hair, wash your hair first as usual with your normal shampoo.  3. After shampooing, rinse your hair and body thoroughly to remove the shampoo.  4. Use CHG as  you would any other liquid soap. You can apply CHG directly to the skin and wash gently with a scrungie or a clean washcloth.  5. Apply the CHG soap to your body only from the neck down. Do not use on open wounds or open sores. Avoid contact with your eyes, ears, mouth, and genitals (private parts). Wash face and genitals (private parts) with your normal soap.  6. Wash thoroughly, paying special attention to the area where your surgery will be performed.  7. Thoroughly rinse your body with warm water.  8. Do not shower/wash with your normal soap after using and rinsing off the CHG soap.  9. Pat yourself dry with a clean towel.  10. Wear clean pajamas to bed the night before surgery.  12. Place clean sheets on your bed the night of your first shower and do not sleep with pets.  13. Shower again with the CHG soap on the day of surgery  prior to arriving at the hospital.  14. Do not apply any deodorants/lotions/powders.  15. Please wear clean clothes to the hospital.

## 2023-07-18 NOTE — Telephone Encounter (Signed)
Received pulmonary clearance from Dr. Merrie Roof. Pt is optimized for surgery.

## 2023-07-22 ENCOUNTER — Other Ambulatory Visit: Payer: Self-pay | Admitting: Nurse Practitioner

## 2023-07-22 DIAGNOSIS — Z1231 Encounter for screening mammogram for malignant neoplasm of breast: Secondary | ICD-10-CM

## 2023-07-23 ENCOUNTER — Encounter: Payer: Self-pay | Admitting: Surgery

## 2023-07-24 ENCOUNTER — Encounter: Payer: Self-pay | Admitting: Surgery

## 2023-07-24 MED ORDER — BUPIVACAINE LIPOSOME 1.3 % IJ SUSP
20.0000 mL | Freq: Once | INTRAMUSCULAR | Status: DC
Start: 2023-07-24 — End: 2023-07-25

## 2023-07-24 MED ORDER — LACTATED RINGERS IV SOLN: INTRAVENOUS | Status: DC

## 2023-07-24 MED ORDER — CEFAZOLIN SODIUM-DEXTROSE 2-4 GM/100ML-% IV SOLN
2.0000 g | INTRAVENOUS | Status: AC
  Administered 2023-07-25: 2 g via INTRAVENOUS

## 2023-07-24 MED ORDER — ACETAMINOPHEN 500 MG PO TABS
1000.0000 mg | ORAL_TABLET | ORAL | Status: AC
  Administered 2023-07-25: 1000 mg via ORAL

## 2023-07-24 MED ORDER — CHLORHEXIDINE GLUCONATE CLOTH 2 % EX PADS: 6.0000 | MEDICATED_PAD | Freq: Once | CUTANEOUS | Status: DC

## 2023-07-24 MED ORDER — CHLORHEXIDINE GLUCONATE 0.12 % MT SOLN
15.0000 mL | Freq: Once | OROMUCOSAL | Status: AC
  Administered 2023-07-25: 15 mL via OROMUCOSAL

## 2023-07-24 MED ORDER — ORAL CARE MOUTH RINSE: 15.0000 mL | Freq: Once | OROMUCOSAL | Status: AC

## 2023-07-24 MED ORDER — GABAPENTIN 300 MG PO CAPS
300.0000 mg | ORAL_CAPSULE | ORAL | Status: AC
  Administered 2023-07-25: 300 mg via ORAL

## 2023-07-24 MED ORDER — CHLORHEXIDINE GLUCONATE CLOTH 2 % EX PADS
6.0000 | MEDICATED_PAD | Freq: Once | CUTANEOUS | Status: AC
  Administered 2023-07-25: 6 via TOPICAL

## 2023-07-24 NOTE — Progress Notes (Signed)
Perioperative / Anesthesia Services  Pre-Admission Testing Clinical Review / Pre-Operative Anesthesia Consult  Date: 07/24/23  Patient Demographics:  Name: [name] DOB:   1956-04-21 MRN:   [id number]  Planned Surgical Procedure(s):    Case: [id number] Date/Time: 07/25/23 0715   Procedure: HERNIA REPAIR VENTRAL ADULT, open   Anesthesia type: General   Pre-op diagnosis: incisional hernia less 3 cm incarcerated   Location: [facility] OR ROOM 06 / [facility]   Surgeons: Henrene Dodge, MD     NOTE: Available PAT nursing documentation and vital signs have been reviewed. Clinical nursing staff has updated patient's PMH/PSHx, current medication list, and drug allergies/intolerances to ensure comprehensive history available to assist in medical decision making as it pertains to the aforementioned surgical procedure and anticipated anesthetic course. Extensive review of available clinical information personally performed. [place] [facility] and PSHx updated with any diagnoses/procedures that  may have been inadvertently omitted during her intake with the pre-admission testing department's nursing staff.  Clinical Discussion:  [name] is a 67 y.o. female who is submitted for pre-surgical anesthesia review and clearance prior to her undergoing the above procedure. Patient is a Current Smoker.  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  Patient is followed by cardiology Welton Flakes, MD). She was last seen in the cardiology clinic on 07/25/2023; notes reviewed. At the time of her clinic  visit, patient doing well overall from a cardiovascular perspective.  Patient with chronic shortness of breath related to her underlying COPD and pulmonary segmentectomy.  Patient on supplemental oxygen at 2.5L/[place]; followed by pulmonary medicine. Patient denied any chest pain, shortness of breath, PND, orthopnea, palpitations, significant peripheral edema, weakness, fatigue, vertiginous symptoms, or presyncope/syncope. Patient with a past medical history significant for cardiovascular diagnoses. Documented physical exam was grossly benign, providing no evidence of acute exacerbation and/or decompensation of the patient's known cardiovascular conditions.  Patient suffered an inferoposterior STEMI.  Patient unsure of the dates of her cardiovascular event, however it is presumed that cardiac event occurred on 04/03/2006, as patient underwent cardiac catheterization.  Diagnostic LEFT heart catheterization was performed on 04/03/2006 revealing multivessel CAD; 25% mid LM, 25% mid LAD, 50% D1, 25% mid LCx/1, 50% mid LCx-2, 50% distal LCx, 25% OM 2, and 100% proximal RCA.  Interventional cardiology made the decision to defer intervention opting for aggressive medical management of patient's coronary artery disease.  Repeat diagnostic LEFT heart catheterization was performed on 04/25/2010 revealing multivessel CAD; 25% proximal LM, 25% proximal LAD, 50% proximal LCx, 70% distal LCx, and total occlusion of the proximal RCA.  Distal vessel was supplied by significant LEFT to RIGHT collaterals from the LAD and LCx  Again, interventional cardiology made the decision to defer intervention opting for continued intensification of medical therapy.  Patient with a history of known BILATERAL carotid artery disease.  Degree of stenosis serially monitored.  Most recent carotid Doppler was performed on 06/19/2022 revealing a 1-39% stenosis of the RICA with a contralateral 40-59% stenosis of the LICA.  Patient with evidence of  known LEFT subclavian artery steal syndrome.  Most recent myocardial perfusion imaging study was performed on 11/15/2022 revealing a normal left ventricular systolic function with an EF of 56%.  There was no evidence of stress-induced  myocardial ischemia or arrhythmia; no scintigraphic evidence of scar.  Study determined to be normal and low risk.  Most recent TTE was performed on 03/21/2023 revealing a normal left ventricular systolic function with an EF of 65%.  They were no regional wall motion abnormalities.  Right ventricular size and function normal.  Trivial mitral valve regurgitation noted.  RVSP elevated at 44 mmHg consistent with known mild pulmonary hypertension. All transvalvular gradients were noted to be normal providing no evidence suggestive of valvular stenosis. Aorta normal in size with no evidence of aneurysmal dilatation.  Blood pressure well controlled at 131/82 mmHg on currently prescribed ARB (losartan( and beta-blocker (metoprolol succinate) therapies.  Patient is on atorvastatin + ezetimibe for her HLD diagnosis and ASCVD prevention. Patient is not diabetic. Patient does not have an OSAH diagnosis. Patient is able to complete all of her  ADL/IADLs without cardiovascular limitation.  Per the DASI, patient is able to achieve at least 4 METS of physical activity without experiencing any significant degree of angina/anginal equivalent symptoms.  No changes were made to her medication regimen.  Patient follow-up with outpatient cardiology in 2 months or sooner if needed.  [name] is scheduled for an elective OPEN VENTRAL HERNIA REPAIR on 07/25/2023 with Dr. Henrene Dodge, MD.  Given patient's past medical history significant for cardiovascular, cardiopulmonary, and multiple medication diagnoses, presurgical clearances from the patient's interdisciplinary care team were sought  by the PAT team. Clearances were obtained as follows:  Per internal medicine (Abernathy, NP-C), "patient  is optimized for surgery. She may proceed with planned surgical intervention at an overall MODERATE risk of perioperative complications".  Per pulmonary medicine (Long, MD), "patient is optimized for surgery from a pulmonary perspective and may proceed as planned".   Per cardiology Welton Flakes, MD), "low risk for hernia surgery, advise proceeding with surgery. Surgery can be done on aspirin, but if he needs to, patient can stop 5 days prior to surgery".  In review of her medication reconciliation, it is noted that patient is currently on prescribed daily antithrombotic therapy. She has been instructed on recommendations for holding her low dose ASA for 5 days prior to her procedure with plans to restart as soon as postoperative bleeding risk felt to be minimized by her attending surgeon. The patient has been instructed that her last dose of her ASA should be on 07/19/2023.  Patient denies previous perioperative complications with anesthesia in the past. In review of the available records, it is noted that patient underwent a general anesthetic course at [facility] ([facility]) in 04/2023 without documented complications.      07/16/2023   10:17 AM 07/16/2023    9:14 AM 07/08/2023   10:31 AM  Vitals with BMI  Height 5\' 4"  5\' 4"    Weight 108 lbs 13 oz 107 lbs 10 oz   BMI 18.67 18.46   Systolic 138 131 315  Diastolic 84 82 78  Pulse 83 85 77    Providers/Specialists:   NOTE: Primary physician provider listed below. Patient may have been seen by APP or partner within same practice.   PROVIDER ROLE / SPECIALTY LAST Eligha Bridegroom, MD General Surgery (Surgeon)  07/08/2023  Sallyanne Kuster, NP Primary Care Provider  07/16/2023  Adrian Blackwater, MD Cardiology  07/16/2023  Merrie Roof, MD Pulmonary Medicine 04/09/2023   Allergies:  Aspirin, Zyrtec [cetirizine], and Tramadol  Current Home Medications:   No current facility-administered medications for this  encounter.  albuterol (VENTOLIN HFA) 108 (90 Base) MCG/ACT inhaler   aspirin EC 81 MG EC tablet   atorvastatin (LIPITOR) 80 MG tablet   budesonide-formoterol (SYMBICORT) 80-4.5 MCG/ACT inhaler   Cholecalciferol (VITAMIN D-3 PO)   ezetimibe (ZETIA) 10 MG tablet   ferrous sulfate 325 (65 FE) MG tablet   folic acid (FOLVITE) 1 MG tablet   losartan (COZAAR) 50 MG tablet   metoprolol succinate (TOPROL-XL) 50 MG 24 hr tablet   thiamine (VITAMIN B-1) 100 MG tablet   gabapentin (NEURONTIN) 100 MG capsule   Multiple Vitamin (MULTIVITAMIN WITH MINERALS) TABS tablet   nicotine (NICODERM CQ - DOSED IN MG/24 HOURS) 14 mg/24hr patch   OXYGEN   History:   Past Medical History:  Diagnosis Date   Adenocarcinoma of upper lobe of right lung (HCC)    a.) s/p posterior segmentectomy on 03/20/23 at [facility]   Anemia    Arthritis    Asthma    Carotid artery stenosis    a.) doppler 05/14/2017: 1-39% BICA; b.) doppler 04/05/2018: 50-69% BICA; c.) doppler 06/12/2019: 40-59% BICA; d.) doppler 06/14/2020, 06/14/2021: 1-39% BICA; e.) doppler 06/19/2022: 1.39% RICA, 40-59% LICA   Colon polyp    COPD (chronic obstructive pulmonary disease) (HCC)    Coronary artery disease 04/03/2006   a.) LHC 04/03/2006: 25% mLM, 25% mLAD, 50% D1, 25/50% mLCx, 50% dLCx, 25% OM2, 100% pRCA --> med mgmt; b.) LHC 04/25/2010: 25% pLM, 25% pLAD, 50% pLCx, 70% dLCx, 100% pRCA --> med mgmt   Depression    Diastolic dysfunction    a.) TTE 04/05/2018: EF 65%, no RWMAs, G1DD, triv MR/TR; b.) TTE 10/15/2022: EF 88%, mod LVH, G1DD, triv TR, mild MR; c.) TTE 02/27/2023: EF 65%, no RWMAs, triv TR/MR, RVSP 35; d.) TTE 03/21/2023: EF 65%, no RWMAs, norm RVSF, PASP 44   Environmental allergies    Headache    Hyperlipidemia    Hypertension    Hypoxic respiratory failure (HCC)    Internal hemorrhoids    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[facility])    a.) thinks it was in 2007 --> LHC 04/03/2006 revealed a 100% pRCA --> medically managed   Necrotizing pneumonia  (HCC)    Pulmonary HTN (HCC)    a.) TTE 02/27/2023: RVSP 35 mmHg; b.) TTE 03/21/2023: PASP 44 mmHg   PVD (peripheral vascular disease) with claudication (HCC)    Shortness of breath dyspnea    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    Substance abuse (HCC)    Substance induced mood disorder (HCC)    Suicidal ideation    Thrombocytopenia (HCC)    Tobacco abuse    Unstable angina (HCC)    Ventral hernia    Vitamin D deficiency    Past Surgical History:  Procedure Laterality Date   ABDOMINAL HYSTERECTOMY     APPENDECTOMY     CARDIAC CATHETERIZATION     CARDIAC SURGERY     CHOLECYSTECTOMY     COLONOSCOPY  12/15/2011   OH->bleeding internal hemorrhoids, otherwise normal   COLONOSCOPY WITH PROPOFOL N/A 04/12/2015   Procedure: COLONOSCOPY WITH PROPOFOL;  Surgeon: Midge Minium, MD;  Location: [facility] ENDOSCOPY;  Service: Endoscopy;  Laterality: N/A;   COLONOSCOPY WITH PROPOFOL N/A 01/05/2020   Procedure: COLONOSCOPY WITH PROPOFOL;  Surgeon: Midge Minium, MD;  Location: [facility] ENDOSCOPY;  Service: Endoscopy;  Laterality: N/A;   ENDARTERECTOMY FEMORAL Left 04/26/2015   Procedure: ENDARTERECTOMY FEMORAL;  Surgeon: Annice Needy, MD;  Location: [facility] ORS;  Service: Vascular;  Laterality: Left;   ENDOVASCULAR STENT INSERTION  Bilateral    Legs   FLEXIBLE BRONCHOSCOPY N/A 02/02/2015   Procedure: FLEXIBLE BRONCHOSCOPY;  Surgeon: Yevonne Pax, MD;  Location: [facility] ORS;  Service: Pulmonary;  Laterality: N/A;   HEMORRHOID SURGERY     lung mass removed     PERIPHERAL VASCULAR CATHETERIZATION Left 04/25/2015   Procedure: Lower Extremity Angiography;  Surgeon: Annice Needy, MD;  Location: [facility] INVASIVE CV LAB;  Service: Cardiovascular;  Laterality: Left;   PERIPHERAL VASCULAR CATHETERIZATION Left 04/25/2015   Procedure: Lower Extremity Intervention;  Surgeon: Annice Needy, MD;  Location: [facility] INVASIVE CV LAB;  Service: Cardiovascular;  Laterality: Left;   PERIPHERAL VASCULAR CATHETERIZATION Left  01/02/2016   Procedure: Lower Extremity Angiography;  Surgeon: Annice Needy, MD;  Location: [facility] INVASIVE CV LAB;  Service: Cardiovascular;  Laterality: Left;   PERIPHERAL VASCULAR CATHETERIZATION  01/02/2016   Procedure: Lower Extremity Intervention;  Surgeon: Annice Needy, MD;  Location: [facility] INVASIVE CV LAB;  Service: Cardiovascular;;   THROMBECTOMY FEMORAL ARTERY Left 04/26/2015   Procedure: THROMBECTOMY FEMORAL ARTERY;  Surgeon: Annice Needy, MD;  Location: [facility] ORS;  Service: Vascular;  Laterality: Left;   Family History  Problem Relation Age of Onset   Hypertension Mother    Hypertension Father    Social History   Tobacco Use   Smoking status: Every Day    Current packs/day: 1.00    Types: Cigarettes    Passive exposure: Past   Smokeless tobacco: Never  Vaping Use   Vaping status: Never Used  Substance Use Topics   Alcohol use: Yes    Alcohol/week: 0.0 standard drinks of alcohol    Comment: weekends, couple beers, pint of liquer only on weekends.  Last drank approx 1 month ago.   Drug use: No    Pertinent Clinical Results:  LABS:   No visits with results within 3 Day(s) from this visit.  Latest known visit with results is:  Admission on 06/11/2023, Discharged on 06/12/2023  Component Date Value Ref Range Status   Sodium 06/11/2023 140  135 - 145 mmol/L Final   Potassium 06/11/2023 3.3 (L)  3.5 - 5.1 mmol/L Final   Chloride 06/11/2023 103  98 - 111 mmol/L Final   CO2 06/11/2023 22  22 - 32 mmol/L Final   Glucose, Bld 06/11/2023 88  70 - 99 mg/dL Final   Glucose reference range applies only to samples taken after fasting for at least 8 hours.   BUN 06/11/2023 16  8 - 23 mg/dL Final   Creatinine, Ser 06/11/2023 0.90  0.44 - 1.00 mg/dL Final   Calcium 40/98/1191 9.3  8.9 - 10.3 mg/dL Final   Total Protein 47/82/9562 7.6  6.5 - 8.1 g/dL Final   Albumin 13/04/6577 4.0  3.5 - 5.0 g/dL Final   AST 46/96/2952 37  15 - 41 U/L Final   ALT 06/11/2023 27  0 - 44 U/L Final    Alkaline Phosphatase 06/11/2023 76  38 - 126 U/L Final   Total Bilirubin 06/11/2023 0.5  0.3 - 1.2 mg/dL Final   GFR, Estimated 06/11/2023 >60  >60 mL/min Final   Comment: (NOTE) Calculated using the CKD-EPI Creatinine Equation (2021)    Anion gap 06/11/2023 15  5 - 15 Final   Performed at [facility], [address] [place], [place] [address]   Alcohol, Ethyl (B) 06/11/2023 402 (HH)  <10 mg/dL Final   Comment: CRITICAL RESULT CALLED TO, READ BACK BY AND VERIFIED WITH AMY COYNE 06/11/23 2114 MU (  NOTE) Lowest detectable limit for serum alcohol is 10 mg/dL.  For medical purposes only. Performed at [facility], [address] [place], [place] [address]    Salicylate Lvl 06/11/2023 <7.0 (L)  7.0 - 30.0 mg/dL Final   Performed at [facility], [address] [place], [place] [address]   Acetaminophen (Tylenol), Serum 06/11/2023 <10 (L)  10 - 30 ug/mL Final   Comment: (NOTE) Therapeutic concentrations vary significantly. A range of 10-30 ug/mL  may be an effective concentration for many patients. However, some  are best treated at concentrations outside of this range. Acetaminophen concentrations >150 ug/mL at 4 hours after ingestion  and >50 ug/mL at 12 hours after ingestion are often associated with  toxic reactions.  Performed at [facility], [address] [place], [place] [address]    WBC 06/11/2023 5.5  4.0 - 10.5 K/uL Final   RBC 06/11/2023 4.03  3.87 - 5.11 MIL/uL Final   Hemoglobin 06/11/2023 12.3  12.0 - 15.0 g/dL Final   HCT 41/32/4401 37.2  36.0 - 46.0 % Final   MCV 06/11/2023 92.3  80.0 - 100.0 fL Final   MCH 06/11/2023 30.5  26.0 - 34.0 pg Final   MCHC 06/11/2023 33.1  30.0 - 36.0 g/dL Final   RDW 02/72/5366 15.5  11.5 - 15.5 % Final   Platelets 06/11/2023 201  150 - 400 K/uL Final   nRBC 06/11/2023 0.0  0.0 - 0.2 % Final   Performed at [facility], [address] [place], [place] [address]    ECG: Date:  06/12/2023 Time ECG obtained: 1257 PM Rate: 77 bpm Rhythm:  Sinus rhythm with 1st degree AV block Axis (leads I and aVF): Normal Intervals: PR 302 ms. QRS 82 ms. QTc 454 ms. ST segment and T wave changes: Inferior ST and T wave abnormalities Comparison: Similar to previous tracing obtained on 09/23/2021   IMAGING / PROCEDURES: US ABDOMEN LIMITED performed on 05/15/2023 Irregular, heterogeneous mass in the subcutaneous soft tissues of the midline upper abdomen. On review of the prior CT scan, there is a subtle fascial defect in the midline upper abdomen, above a small fat containing umbilical hernia. This would correspond in location to the current palpable mass. Although the etiology of this lump is not definitive, it is suspected to be herniated peritoneal fat from a small midline hernia. This could be further assessed, if desired clinically, with MRI.  CT ABDOMEN PELVIS W CONTRAST performed on 05/05/2023 No acute findings in the abdomen or pelvis. Prior cholecystectomy with stable intrahepatic and extrahepatic biliary ductal dilatation, likely related to post cholecystectomy  state.  Recommend correlation with LFTs.   TRANSTHORACIC ECHOCARDIOGRAM performed on 02/27/2023 Suboptimal study due to body habitus.  Normal chamber sizes.  Normal left ventricular systolic function.  Normal left ventricular diastolic function.  Normal right ventricular systolic function.  Normal right ventricular diastolic function.  Normal left ventricular wall motion.  Normal right ventricular wall motion.  Trace tricuspid regurgitation.  Normal pulmonary artery pressure; PASP 44 mmHg Trace mitral regurgitation..  No pericardial effusion.  No LVH   NM PET IMAGE INITIAL (PI) SKULL BASE TO THIGH performed on 12/24/2022 Hypermetabolic spiculated right upper lobe nodule, consistent with stage IA primary bronchogenic carcinoma. Aortic atherosclerosis  Coronary artery calcification Emphysema   CT CHEST  LUNG CA SCREEN LOW DOSE W/O CM performed on 12/06/2022 New solid spiculated nodule of the right upper lobe measuring 14.9 mm. Lung-RADS 4XS, highly suspicious. Additional imaging evaluation or  consultation with Pulmonology or Thoracic Surgery recommended. S modifier for coronary artery calcifications. Severe left main and three-vessel coronary artery calcifications. Aortic atherosclerosis  Emphysema   MYOCARDIAL PERFUSION IMAGING STUDY (LEXISCAN) performed on 11/15/2022 Normal left ventricular systolic function with a normal LVEF of 56% Normal myocardial thickening and wall motion Left ventricular cavity size normal SPECT images demonstrate homogenous tracer distribution throughout the myocardium No evidence of stress-induced myocardial ischemia or arrhythmia Normal low risk study  VAS [place] ABI WITH/WO TBI performed on 09/18/2022 Resting right ankle-brachial index is within normal range. The right toe-brachial index is abnormal.  Resting left ankle-brachial index indicates moderate left lower extremity arterial disease. The left toe-brachial index is abnormal.   VAS US CAROTID performed on 06/19/2022 Velocities in the right ICA are consistent with a 1-39% stenosis. Non-hemodynamically significant plaque <50% noted in the CCA. The ECA appears >50% stenosed.  Velocities in the left ICA are consistent with a 40-59% stenosis. Non-hemodynamically significant plaque <50% noted in the CCA. The ECA appears >50% stenosed.  Right vertebral artery demonstrates antegrade flow. Left vertebral artery demonstrates retrograde flow.  Left subclavian artery was stenotic. Normal flow hemodynamics were seen in the right subclavian artery.   Impression and Plan:  [name] has been referred for pre-anesthesia review and clearance prior to her undergoing the planned anesthetic and procedural courses. Available labs, pertinent testing, and imaging results were personally reviewed by me in preparation for  upcoming operative/procedural course. [facility] medical record has been updated following extensive record review and patient interview with PAT staff.   This patient has been appropriately cleared by cardiology (ACCEPTABLE), pulmonary medicine (ACCEPTABLE), and internal/family medicine (MODERATE) with the individually indicated risks of experiencing significant perioperative complications. Based on clinical review performed today (07/24/23), barring any significant acute changes in the patient's overall condition, it is anticipated that she will be able to proceed with the planned surgical intervention. Any acute changes in clinical condition may necessitate her procedure being postponed and/or cancelled. Patient will meet with anesthesia team (MD and/or CRNA) on the day of her procedure for preoperative evaluation/assessment. Questions regarding anesthetic course will be fielded at that time.   Pre-surgical instructions were reviewed with the patient during her PAT appointment, and questions were fielded to satisfaction by PAT clinical staff. She has been instructed on which medications that she will need to hold prior to surgery, as well as the ones that have been deemed safe/appropriate to take on the day of her procedure. As part of the general education provided by PAT, patient made aware both verbally and in writing, that she would need to abstain from the use of any illegal substances during her perioperative course.  She was advised that failure to follow the provided instructions could necessitate case cancellation or result in serious perioperative complications up to and including death. Patient encouraged to contact PAT and/or her surgeon's office to discuss any questions or concerns that may arise prior to surgery; verbalized understanding.   Quentin Mulling, MSN, APRN, FNP-C, CEN [facility]  Perioperative Services Nurse Practitioner Phone: [phone] Fax: [phone] 07/24/23 2:42 PM  NOTE: This note has been prepared using Dragon dictation software. Despite my best ability to proofread, there is always the potential that unintentional transcriptional errors may still occur from this process.

## 2023-07-25 ENCOUNTER — Other Ambulatory Visit: Payer: Self-pay

## 2023-07-25 ENCOUNTER — Ambulatory Visit: Payer: 59 | Admitting: Urgent Care

## 2023-07-25 ENCOUNTER — Encounter
Admission: RE | Disposition: A | Discharge status: Home / Self Care | Payer: Self-pay | Source: Home / Self Care | Attending: Surgery

## 2023-07-25 ENCOUNTER — Ambulatory Visit
Admission: RE | Admit: 2023-07-25 | Discharge: 2023-07-25 | Disposition: A | Discharge status: Home / Self Care | Payer: 59 | Attending: Surgery | Admitting: Surgery

## 2023-07-25 ENCOUNTER — Encounter: Payer: Self-pay | Admitting: Surgery

## 2023-07-25 DIAGNOSIS — I739 Peripheral vascular disease, unspecified: Secondary | ICD-10-CM | POA: Diagnosis not present

## 2023-07-25 DIAGNOSIS — F17210 Nicotine dependence, cigarettes, uncomplicated: Secondary | ICD-10-CM | POA: Diagnosis not present

## 2023-07-25 DIAGNOSIS — D649 Anemia, unspecified: Secondary | ICD-10-CM | POA: Insufficient documentation

## 2023-07-25 DIAGNOSIS — I10 Essential (primary) hypertension: Secondary | ICD-10-CM | POA: Insufficient documentation

## 2023-07-25 DIAGNOSIS — F32A Depression, unspecified: Secondary | ICD-10-CM | POA: Insufficient documentation

## 2023-07-25 DIAGNOSIS — Z7951 Long term (current) use of inhaled steroids: Secondary | ICD-10-CM | POA: Diagnosis not present

## 2023-07-25 DIAGNOSIS — Z9049 Acquired absence of other specified parts of digestive tract: Secondary | ICD-10-CM | POA: Insufficient documentation

## 2023-07-25 DIAGNOSIS — J4489 Other specified chronic obstructive pulmonary disease: Secondary | ICD-10-CM | POA: Insufficient documentation

## 2023-07-25 DIAGNOSIS — I2510 Atherosclerotic heart disease of native coronary artery without angina pectoris: Secondary | ICD-10-CM | POA: Diagnosis not present

## 2023-07-25 DIAGNOSIS — I252 Old myocardial infarction: Secondary | ICD-10-CM | POA: Diagnosis not present

## 2023-07-25 DIAGNOSIS — I272 Pulmonary hypertension, unspecified: Secondary | ICD-10-CM | POA: Diagnosis not present

## 2023-07-25 DIAGNOSIS — K430 Incisional hernia with obstruction, without gangrene: Secondary | ICD-10-CM | POA: Insufficient documentation

## 2023-07-25 DIAGNOSIS — F17200 Nicotine dependence, unspecified, uncomplicated: Secondary | ICD-10-CM | POA: Diagnosis not present

## 2023-07-25 DIAGNOSIS — K439 Ventral hernia without obstruction or gangrene: Secondary | ICD-10-CM | POA: Diagnosis not present

## 2023-07-25 DIAGNOSIS — E785 Hyperlipidemia, unspecified: Secondary | ICD-10-CM | POA: Diagnosis not present

## 2023-07-25 DIAGNOSIS — Z7982 Long term (current) use of aspirin: Secondary | ICD-10-CM | POA: Diagnosis not present

## 2023-07-25 DIAGNOSIS — M1990 Unspecified osteoarthritis, unspecified site: Secondary | ICD-10-CM | POA: Diagnosis not present

## 2023-07-25 HISTORY — DX: Peripheral vascular disease, unspecified: I73.9

## 2023-07-25 HISTORY — DX: Polyp of colon: K63.5

## 2023-07-25 HISTORY — DX: Malignant neoplasm of upper lobe, right bronchus or lung: C34.11

## 2023-07-25 HISTORY — DX: Thrombocytopenia, unspecified: D69.6

## 2023-07-25 HISTORY — DX: Suicidal ideations: R45.851

## 2023-07-25 HISTORY — DX: Ventral hernia without obstruction or gangrene: K43.9

## 2023-07-25 HISTORY — DX: Other ill-defined heart diseases: I51.89

## 2023-07-25 HISTORY — DX: Other hemorrhoids: K64.8

## 2023-07-25 HISTORY — DX: Other transient cerebral ischemic attacks and related syndromes: G45.8

## 2023-07-25 HISTORY — DX: Vitamin D deficiency, unspecified: E55.9

## 2023-07-25 HISTORY — DX: Other psychoactive substance use, unspecified with psychoactive substance-induced mood disorder: F19.94

## 2023-07-25 HISTORY — PX: VENTRAL HERNIA REPAIR: SHX424

## 2023-07-25 HISTORY — DX: Anemia, unspecified: D64.9

## 2023-07-25 HISTORY — DX: Other allergy status, other than to drugs and biological substances: Z91.09

## 2023-07-25 HISTORY — DX: Gangrene and necrosis of lung: J85.0

## 2023-07-25 HISTORY — DX: Respiratory failure, unspecified with hypoxia: J96.91

## 2023-07-25 HISTORY — DX: Unstable angina: I20.0

## 2023-07-25 SURGERY — REPAIR, HERNIA, VENTRAL
Anesthesia: General | Site: Abdomen

## 2023-07-25 MED ORDER — CHLORHEXIDINE GLUCONATE 0.12 % MT SOLN
OROMUCOSAL | Status: AC
Start: 2023-07-25 — End: ?
  Filled 2023-07-25: qty 15

## 2023-07-25 MED ORDER — BUPIVACAINE-EPINEPHRINE (PF) 0.25% -1:200000 IJ SOLN
INTRAMUSCULAR | Status: AC
  Filled 2023-07-25: qty 30

## 2023-07-25 MED ORDER — CEFAZOLIN SODIUM-DEXTROSE 2-4 GM/100ML-% IV SOLN
INTRAVENOUS | Status: AC
  Filled 2023-07-25: qty 100

## 2023-07-25 MED ORDER — DEXAMETHASONE SODIUM PHOSPHATE 10 MG/ML IJ SOLN
INTRAMUSCULAR | Status: DC | PRN
  Administered 2023-07-25: 10 mg via INTRAVENOUS

## 2023-07-25 MED ORDER — GABAPENTIN 300 MG PO CAPS
ORAL_CAPSULE | ORAL | Status: AC
  Filled 2023-07-25: qty 1

## 2023-07-25 MED ORDER — MIDAZOLAM HCL 2 MG/2ML IJ SOLN
INTRAMUSCULAR | Status: AC
  Filled 2023-07-25: qty 2

## 2023-07-25 MED ORDER — PROPOFOL 10 MG/ML IV BOLUS
INTRAVENOUS | Status: AC
  Filled 2023-07-25: qty 20

## 2023-07-25 MED ORDER — KETOROLAC TROMETHAMINE 30 MG/ML IJ SOLN
INTRAMUSCULAR | Status: AC
  Filled 2023-07-25: qty 1

## 2023-07-25 MED ORDER — ONDANSETRON HCL 4 MG/2ML IJ SOLN
INTRAMUSCULAR | Status: AC
  Filled 2023-07-25: qty 2

## 2023-07-25 MED ORDER — LIDOCAINE HCL (CARDIAC) PF 100 MG/5ML IV SOSY
PREFILLED_SYRINGE | INTRAVENOUS | Status: DC | PRN
  Administered 2023-07-25: 50 mg via INTRAVENOUS

## 2023-07-25 MED ORDER — ROCURONIUM BROMIDE 10 MG/ML (PF) SYRINGE
PREFILLED_SYRINGE | INTRAVENOUS | Status: AC
  Filled 2023-07-25: qty 10

## 2023-07-25 MED ORDER — OXYCODONE HCL 5 MG/5ML PO SOLN: 5.0000 mg | Freq: Once | ORAL | Status: DC | PRN

## 2023-07-25 MED ORDER — FENTANYL CITRATE (PF) 100 MCG/2ML IJ SOLN
INTRAMUSCULAR | Status: AC
  Filled 2023-07-25: qty 2

## 2023-07-25 MED ORDER — LIDOCAINE HCL (PF) 2 % IJ SOLN
INTRAMUSCULAR | Status: AC
  Filled 2023-07-25: qty 5

## 2023-07-25 MED ORDER — KETOROLAC TROMETHAMINE 30 MG/ML IJ SOLN
INTRAMUSCULAR | Status: DC | PRN
  Administered 2023-07-25: 15 mg via INTRAVENOUS

## 2023-07-25 MED ORDER — BUPIVACAINE-EPINEPHRINE 0.25% -1:200000 IJ SOLN
INTRAMUSCULAR | Status: DC | PRN
  Administered 2023-07-25: 30 mL via INTRAMUSCULAR

## 2023-07-25 MED ORDER — SUGAMMADEX SODIUM 200 MG/2ML IV SOLN
INTRAVENOUS | Status: DC | PRN
Start: 2023-07-25 — End: 2023-07-25
  Administered 2023-07-25: 196 mg via INTRAVENOUS

## 2023-07-25 MED ORDER — OXYCODONE HCL 5 MG PO TABS: 5.0000 mg | ORAL_TABLET | ORAL | 0 refills | Status: DC | PRN

## 2023-07-25 MED ORDER — BUPIVACAINE LIPOSOME 1.3 % IJ SUSP
INTRAMUSCULAR | Status: AC
  Filled 2023-07-25: qty 20

## 2023-07-25 MED ORDER — PROPOFOL 10 MG/ML IV BOLUS
INTRAVENOUS | Status: DC | PRN
  Administered 2023-07-25: 80 mg via INTRAVENOUS

## 2023-07-25 MED ORDER — ACETAMINOPHEN 500 MG PO TABS
ORAL_TABLET | ORAL | Status: AC
Start: 2023-07-25 — End: ?
  Filled 2023-07-25: qty 2

## 2023-07-25 MED ORDER — PHENYLEPHRINE 80 MCG/ML (10ML) SYRINGE FOR IV PUSH (FOR BLOOD PRESSURE SUPPORT)
PREFILLED_SYRINGE | INTRAVENOUS | Status: AC
  Filled 2023-07-25: qty 10

## 2023-07-25 MED ORDER — FENTANYL CITRATE (PF) 100 MCG/2ML IJ SOLN: 25.0000 ug | INTRAMUSCULAR | Status: DC | PRN

## 2023-07-25 MED ORDER — ACETAMINOPHEN 500 MG PO TABS: 1000.0000 mg | ORAL_TABLET | Freq: Four times a day (QID) | ORAL | Status: DC | PRN

## 2023-07-25 MED ORDER — OXYCODONE HCL 5 MG PO TABS: 5.0000 mg | ORAL_TABLET | Freq: Once | ORAL | Status: DC | PRN

## 2023-07-25 MED ORDER — MIDAZOLAM HCL 2 MG/2ML IJ SOLN
INTRAMUSCULAR | Status: DC | PRN
  Administered 2023-07-25: 2 mg via INTRAVENOUS

## 2023-07-25 MED ORDER — ROCURONIUM BROMIDE 100 MG/10ML IV SOLN
INTRAVENOUS | Status: DC | PRN
  Administered 2023-07-25: 10 mg via INTRAVENOUS
  Administered 2023-07-25: 30 mg via INTRAVENOUS

## 2023-07-25 MED ORDER — FENTANYL CITRATE (PF) 100 MCG/2ML IJ SOLN
INTRAMUSCULAR | Status: DC | PRN
  Administered 2023-07-25 (×2): 25 ug via INTRAVENOUS

## 2023-07-25 MED ORDER — SEVOFLURANE IN SOLN
RESPIRATORY_TRACT | Status: AC
  Filled 2023-07-25: qty 250

## 2023-07-25 MED ORDER — ONDANSETRON HCL 4 MG/2ML IJ SOLN
INTRAMUSCULAR | Status: DC | PRN
  Administered 2023-07-25: 4 mg via INTRAVENOUS

## 2023-07-25 MED ORDER — DEXAMETHASONE SODIUM PHOSPHATE 10 MG/ML IJ SOLN
INTRAMUSCULAR | Status: AC
  Filled 2023-07-25: qty 1

## 2023-07-25 MED ORDER — PHENYLEPHRINE 80 MCG/ML (10ML) SYRINGE FOR IV PUSH (FOR BLOOD PRESSURE SUPPORT)
PREFILLED_SYRINGE | INTRAVENOUS | Status: DC | PRN
  Administered 2023-07-25 (×2): 120 ug via INTRAVENOUS
  Administered 2023-07-25: 80 ug via INTRAVENOUS

## 2023-07-25 MED ORDER — 0.9 % SODIUM CHLORIDE (POUR BTL) OPTIME
TOPICAL | Status: DC | PRN
  Administered 2023-07-25: 500 mL

## 2023-07-25 SURGICAL SUPPLY — 28 items
CHLORAPREP W/TINT 26 (MISCELLANEOUS) ×1 IMPLANT
DERMABOND ADVANCED .7 DNX12 (GAUZE/BANDAGES/DRESSINGS) ×1 IMPLANT
DRAPE LAPAROTOMY 100X77 ABD (DRAPES) ×1 IMPLANT
ELECT CAUTERY BLADE TIP 2.5 (TIP) ×1
ELECT REM PT RETURN 9FT ADLT (ELECTROSURGICAL) ×1
ELECTRODE CAUTERY BLDE TIP 2.5 (TIP) ×1 IMPLANT
ELECTRODE REM PT RTRN 9FT ADLT (ELECTROSURGICAL) ×1 IMPLANT
GAUZE 4X4 16PLY ~~LOC~~+RFID DBL (SPONGE) ×1 IMPLANT
GLOVE SURG SYN 7.0 (GLOVE) ×1 IMPLANT
GLOVE SURG SYN 7.0 PF PI (GLOVE) ×1 IMPLANT
GOWN STRL REUS W/ TWL LRG LVL3 (GOWN DISPOSABLE) ×2 IMPLANT
MANIFOLD NEPTUNE II (INSTRUMENTS) ×1 IMPLANT
MESH VENTRALEX ST 2.5 CRC MED (Mesh General) IMPLANT
NDL HYPO 22X1.5 SAFETY MO (MISCELLANEOUS) ×1 IMPLANT
NEEDLE HYPO 22X1.5 SAFETY MO (MISCELLANEOUS) ×1 IMPLANT
NS IRRIG 500ML POUR BTL (IV SOLUTION) ×1 IMPLANT
PACK BASIN MINOR ARMC (MISCELLANEOUS) ×1 IMPLANT
SUT ETHIBOND 0 (SUTURE) ×1 IMPLANT
SUT ETHIBOND 0 MO6 C/R (SUTURE) ×1 IMPLANT
SUT MNCRL AB 4-0 PS2 18 (SUTURE) ×1 IMPLANT
SUT PROLENE 2 0 SH DA (SUTURE) ×1 IMPLANT
SUT SILK 0 30XBRD TIE 6 (SUTURE) ×1 IMPLANT
SUT SILK 0 SH 30 (SUTURE) ×1 IMPLANT
SUT VIC AB 0 SH 27 (SUTURE) ×1 IMPLANT
SUT VIC AB 3-0 SH 27X BRD (SUTURE) ×3 IMPLANT
SYR 10ML LL (SYRINGE) ×1 IMPLANT
TRAP FLUID SMOKE EVACUATOR (MISCELLANEOUS) ×1 IMPLANT
WATER STERILE IRR 500ML POUR (IV SOLUTION) ×1 IMPLANT

## 2023-07-25 NOTE — Transfer of Care (Signed)
Immediate Anesthesia Transfer of Care Note  Patient: [name]  Procedure(s) Performed: HERNIA REPAIR VENTRAL ADULT, open (Abdomen)  Patient Location: PACU  Anesthesia Type:General  Level of Consciousness: drowsy  Airway & Oxygen Therapy: Patient Spontanous Breathing and Patient connected to face mask oxygen  Post-op Assessment: Report given to RN and Post -op Vital signs reviewed and stable  Post vital signs: Reviewed and stable  Last Vitals:  Vitals Value Taken Time  BP 100/73 07/25/23 0846  Temp 36.2 C 07/25/23 0845  Pulse 60 07/25/23 0850  Resp 15 07/25/23 0850  SpO2 100 % 07/25/23 0850  Vitals shown include unfiled device data.  Last Pain:  Vitals:   07/25/23 0845  TempSrc:   PainSc: 0-No pain      Patients Stated Pain Goal: 0 (07/25/23 1478)  Complications: No notable events documented.

## 2023-07-25 NOTE — Interval H&P Note (Signed)
History and Physical Interval Note:  07/25/2023 7:08 AM  [name]  has presented today for surgery, with the diagnosis of incisional hernia less 3 cm incarcerated.  The various methods of treatment have been discussed with the patient and family. After consideration of risks, benefits and other options for treatment, the patient has consented to  Procedure(s): HERNIA REPAIR VENTRAL ADULT, open (N/A) as a surgical intervention.  The patient's history has been reviewed, patient examined, no change in status, stable for surgery.  I have reviewed the patient's chart and labs.  Questions were answered to the patient's satisfaction.     Xochil Shanker

## 2023-07-25 NOTE — Discharge Instructions (Signed)
Discharge Instructions: 1.  Patient may shower, but do not scrub wounds heavily and dab dry only. 2.  Do not submerge wounds in pool/tub until fully healed. 3.  Do not apply ointments or hydrogen peroxide to the wounds. 4.  May apply ice packs to the wounds for comfort. 5.  May resume your Aspirin on 07/27/23. 6.  Do not drive while taking narcotics for pain control.  Prior to driving, make sure you are able to rotate right and left to look at blindspots without significant pain or discomfort. 7.  No heavy lifting or pushing of more than 10-15 lbs for 6 weeks.

## 2023-07-25 NOTE — Anesthesia Postprocedure Evaluation (Signed)
Anesthesia Post Note  Patient: [name]  Procedure(s) Performed: HERNIA REPAIR VENTRAL ADULT, open (Abdomen)  Patient location during evaluation: PACU Anesthesia Type: General Level of consciousness: awake and alert Pain management: pain level controlled Vital Signs Assessment: post-procedure vital signs reviewed and stable Respiratory status: spontaneous breathing, nonlabored ventilation, respiratory function stable and patient connected to nasal cannula oxygen Cardiovascular status: blood pressure returned to baseline and stable Postop Assessment: no apparent nausea or vomiting Anesthetic complications: no   No notable events documented.   Last Vitals:  Vitals:   07/25/23 0919 07/25/23 0920  BP: (!) 86/74 (!) 142/84  Pulse: 76 76  Resp: 19 19  Temp:  (!) 36.1 C  SpO2: 93% 92%    Last Pain:  Vitals:   07/25/23 0915  TempSrc:   PainSc: 0-No pain                 Cleda Mccreedy [name]

## 2023-07-25 NOTE — Op Note (Signed)
  Procedure Date:  07/25/2023  Pre-operative Diagnosis:  Incarcerated Incisional epigastric ventral hernia  Post-operative Diagnosis:  Incarcerated Incisional epigastric ventral hernia, 2 cm.  Procedure:  Open Incarcerated Ventral Hernia Repair with mesh  Surgeon:  Howie Ill, MD  Anesthesia:  General endotracheal  Estimated Blood Loss:  5 ml  Specimens:  None  Complications:  None  Indications for Procedure:  This is a 67 y.o. female who presents with an incisional incarcerated ventral hernia, causing her some epigastric pain.  The options of surgery versus observation were reviewed with the patient and/or family. The risks of bleeding, abscess or infection, recurrence of symptoms, injury to surrounding structures, and chronic pain were all discussed with the patient and was willing to proceed.  Description of Procedure: The patient was correctly identified in the preoperative area and brought into the operating room.  The patient was placed supine with VTE prophylaxis in place.  Appropriate time-outs were performed.  Anesthesia was induced and the patient was intubated.  Appropriate antibiotics were infused.  The abdomen was prepped and draped in a sterile fashion. The patient's hernia defect was marked with a marking pen.  A midline incision was created over the hernia defect and dissection was carried down the subcutaneous tissue using electrocautery.  The patient had two small hernias which were incarcerated, containing preperitoneal fat.  The hernia sacs were identified and dissected down to healthy fascia.  The preperitoneal fat was resected, and the hernia defect was fully exposed.  The two small defects were combined into one larger 2 cm defect.  A 6.4 cm Ventralex ST hernia patch mesh was placed in underlay position and the tails secured to the fascial edges using 2-0 Prolene.  Then, the hernia defect was closed using 0 Ethibond sutures, incorporating a layer of the mesh with  each bite to secure the mesh further in place.  30 ml of Exparel solutio mixed with 0.5% bupivacaine was infiltrated onto the fascia and subcutaneous tissue.  The wound was then closed in three layers using 3-0 Vicryl and 4-0 Monocryl.  The incision was cleaned and sealed with DermaBond.  The patient was emerged from anesthesia and extubated and brought to the recovery room for further management.  The patient tolerated the procedure well and all counts were correct at the end of the case.   Howie Ill, MD

## 2023-07-25 NOTE — Anesthesia Preprocedure Evaluation (Addendum)
Anesthesia Evaluation  Patient identified by MRN, date of birth, ID band Patient awake    Reviewed: Allergy & Precautions, NPO status , Patient's Chart, lab work & pertinent test results  History of Anesthesia Complications Negative for: history of anesthetic complications  Airway Mallampati: III  TM Distance: >3 FB Neck ROM: full    Dental  (+) Chipped, Poor Dentition, Missing   Pulmonary shortness of breath and with exertion, asthma , COPD, Current Smoker and Patient abstained from smoking.    + decreased breath sounds      Cardiovascular hypertension, (-) angina + CAD, + Past MI and + Peripheral Vascular Disease  Normal cardiovascular exam     Neuro/Psych  Headaches PSYCHIATRIC DISORDERS         GI/Hepatic negative GI ROS, Neg liver ROS,neg GERD  ,,  Endo/Other  negative endocrine ROS    Renal/GU Renal disease     Musculoskeletal  (+) Arthritis ,    Abdominal   Peds  Hematology negative hematology ROS (+)   Anesthesia Other Findings Patient has cardiac clearance for this procedure.   Past Medical History: No date: Adenocarcinoma of upper lobe of right lung (HCC)     Comment:  a.) s/p posterior segmentectomy on 03/20/23 at [facility] No date: Anemia No date: Arthritis No date: Asthma No date: Carotid artery stenosis     Comment:  a.) doppler 05/14/2017: 1-39% BICA; b.) doppler               04/05/2018: 50-69% BICA; c.) doppler 06/12/2019: 40-59%               BICA; d.) doppler 06/14/2020, 06/14/2021: 1-39% BICA; e.)              doppler 06/19/2022: 1.39% RICA, 40-59% LICA No date: Colon polyp No date: COPD (chronic obstructive pulmonary disease) (HCC) 04/03/2006: Coronary artery disease     Comment:  a.) LHC 04/03/2006: 25% mLM, 25% mLAD, 50% D1, 25/50%               mLCx, 50% dLCx, 25% OM2, 100% pRCA --> med mgmt; b.) LHC               04/25/2010: 25% pLM, 25% pLAD, 50% pLCx, 70% dLCx, 100%               pRCA  --> med mgmt No date: Depression No date: Diastolic dysfunction     Comment:  a.) TTE 04/05/2018: EF 65%, no RWMAs, G1DD, triv MR/TR;               b.) TTE 10/15/2022: EF 88%, mod LVH, G1DD, triv TR, mild               MR; c.) TTE 02/27/2023: EF 65%, no RWMAs, triv TR/MR,               RVSP 35; d.) TTE 03/21/2023: EF 65%, no RWMAs, norm RVSF,              PASP 44 No date: Environmental allergies No date: Headache No date: Hyperlipidemia No date: Hypertension No date: Hypoxic respiratory failure (HCC) No date: Internal hemorrhoids No date: 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[facility])     Comment:  a.) thinks it was in 2007 --> LHC 04/03/2006 revealed a               100% pRCA --> medically managed No date: Necrotizing pneumonia (HCC) No date: Pulmonary HTN (HCC)     Comment:  a.) TTE  02/27/2023: RVSP 35 mmHg; b.) TTE 03/21/2023:               PASP 44 mmHg No date: PVD (peripheral vascular disease) with claudication (HCC) No date: Shortness of breath dyspnea No date: 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 No date: Substance abuse (HCC) No date: Substance induced mood disorder (HCC) No date: Suicidal ideation No date: Thrombocytopenia (HCC) No date: Tobacco abuse No date: Unstable angina (HCC) No date: Ventral hernia No date: Vitamin D deficiency  Past Surgical History: No date: ABDOMINAL HYSTERECTOMY No date: APPENDECTOMY No date: CARDIAC CATHETERIZATION No date: CARDIAC SURGERY No date: CHOLECYSTECTOMY 12/15/2011: COLONOSCOPY     Comment:  OH->bleeding internal hemorrhoids, otherwise normal 04/12/2015: COLONOSCOPY WITH PROPOFOL; N/A     Comment:  Procedure: COLONOSCOPY WITH PROPOFOL;  Surgeon: Midge Minium, MD;  Location: [facility] ENDOSCOPY;  Service: Endoscopy;              Laterality: N/A; 01/05/2020: COLONOSCOPY WITH PROPOFOL; N/A     Comment:  Procedure: COLONOSCOPY WITH PROPOFOL;  Surgeon: Midge Minium, MD;  Location: [facility] ENDOSCOPY;  Service:                Endoscopy;  Laterality: N/A; 04/26/2015: ENDARTERECTOMY FEMORAL; Left     Comment:  Procedure: ENDARTERECTOMY FEMORAL;  Surgeon: Annice Needy, MD;  Location: [facility] ORS;  Service: Vascular;                Laterality: Left; No date: ENDOVASCULAR STENT INSERTION; Bilateral     Comment:  Legs 02/02/2015: FLEXIBLE BRONCHOSCOPY; N/A     Comment:  Procedure: FLEXIBLE BRONCHOSCOPY;  Surgeon: Yevonne Pax, MD;  Location: [facility] ORS;  Service: Pulmonary;                Laterality: N/A; No date: HEMORRHOID SURGERY No date: lung mass removed 04/25/2015: PERIPHERAL VASCULAR CATHETERIZATION; Left     Comment:  Procedure: Lower Extremity Angiography;  Surgeon: Annice Needy, MD;  Location: [facility];  Service:               Cardiovascular;  Laterality: Left; 04/25/2015: PERIPHERAL VASCULAR CATHETERIZATION; Left     Comment:  Procedure: Lower Extremity Intervention;  Surgeon: Annice Needy, MD;  Location: [facility];  Service:               Cardiovascular;  Laterality: Left; 01/02/2016: PERIPHERAL VASCULAR CATHETERIZATION; Left     Comment:  Procedure: Lower Extremity Angiography;  Surgeon: Annice Needy, MD;  Location: [facility];  Service:               Cardiovascular;  Laterality: Left; 01/02/2016: PERIPHERAL VASCULAR CATHETERIZATION     Comment:  Procedure: Lower Extremity Intervention;  Surgeon: Annice Needy, MD;  Location: [facility];  Service:  Cardiovascular;; 04/26/2015: THROMBECTOMY FEMORAL ARTERY; Left     Comment:  Procedure: THROMBECTOMY FEMORAL ARTERY;  Surgeon: Annice Needy, MD;  Location: [facility] ORS;  Service: Vascular;                Laterality: Left;  BMI    Body Mass Index: 18.54 kg/m      Reproductive/Obstetrics negative OB ROS                             Anesthesia Physical Anesthesia Plan  ASA:  3  Anesthesia Plan: General ETT   Post-op Pain Management:    Induction: Intravenous  PONV Risk Score and Plan: Ondansetron, Dexamethasone, Midazolam and Treatment may vary due to age or medical condition  Airway Management Planned: Oral ETT  Additional Equipment:   Intra-op Plan:   Post-operative Plan: Extubation in OR  Informed Consent: I have reviewed the patients History and Physical, chart, labs and discussed the procedure including the risks, benefits and alternatives for the proposed anesthesia with the patient or authorized representative who has indicated his/her understanding and acceptance.     Dental Advisory Given  Plan Discussed with: Anesthesiologist, CRNA and Surgeon  Anesthesia Plan Comments: (Patient consented for risks of anesthesia including but not limited to:  - adverse reactions to medications - damage to eyes, teeth, lips or other oral mucosa - nerve damage due to positioning  - sore throat or hoarseness - Damage to heart, brain, nerves, lungs, other parts of body or loss of life  Patient voiced understanding and assent.)       Anesthesia Quick Evaluation

## 2023-07-25 NOTE — Anesthesia Procedure Notes (Signed)
Procedure Name: Intubation Date/Time: 07/25/2023 7:38 AM  Performed by: Wyat Infinger, [place], CRNAPre-anesthesia Checklist: Patient identified, Patient being monitored, Timeout performed, Emergency Drugs available and Suction available Patient Re-evaluated:Patient Re-evaluated prior to induction Oxygen Delivery Method: Circle system utilized Preoxygenation: Pre-oxygenation with 100% oxygen Induction Type: IV induction Ventilation: Mask ventilation without difficulty Laryngoscope Size: 3 and McGrath Grade View: Grade I Tube type: Oral Tube size: 7.0 mm Number of attempts: 1 Airway Equipment and Method: Stylet Placement Confirmation: ETT inserted through vocal cords under direct vision, positive ETCO2 and breath sounds checked- equal and bilateral Secured at: 22 cm Tube secured with: Tape Dental Injury: Teeth and Oropharynx as per pre-operative assessment

## 2023-07-28 DIAGNOSIS — R911 Solitary pulmonary nodule: Secondary | ICD-10-CM | POA: Diagnosis not present

## 2023-08-09 ENCOUNTER — Ambulatory Visit (INDEPENDENT_AMBULATORY_CARE_PROVIDER_SITE_OTHER): Payer: 59 | Admitting: Surgery

## 2023-08-09 ENCOUNTER — Encounter: Payer: Self-pay | Admitting: Surgery

## 2023-08-09 VITALS — BP 170/95 | HR 78 | Temp 97.9°F | Ht 64.0 in | Wt 107.8 lb

## 2023-08-09 DIAGNOSIS — Z09 Encounter for follow-up examination after completed treatment for conditions other than malignant neoplasm: Secondary | ICD-10-CM | POA: Diagnosis not present

## 2023-08-09 DIAGNOSIS — K430 Incisional hernia with obstruction, without gangrene: Secondary | ICD-10-CM | POA: Diagnosis not present

## 2023-08-09 MED ORDER — CYCLOBENZAPRINE HCL 5 MG PO TABS: 5.0000 mg | ORAL_TABLET | Freq: Three times a day (TID) | ORAL | 0 refills | Status: DC | PRN

## 2023-08-09 MED ORDER — OXYCODONE HCL 5 MG PO TABS: 5.0000 mg | ORAL_TABLET | ORAL | 0 refills | Status: DC | PRN

## 2023-08-09 NOTE — Progress Notes (Signed)
08/09/2023  History of Present Illness: [name] is a 67 y.o. female status post open incarcerated incisional epigastric hernia repair on 07/25/2023.  Patient presents today for follow-up.  The patient reports that she has been having significant pain in the epigastric region at the incision itself and sometimes it catches her and cause significant discomfort.  Denies any bulging sensation in the area and denies any troubles with the incision itself.  She is able to tolerate a diet and is not having any nausea or vomiting.  Past Medical History: Past Medical History:  Diagnosis Date   Adenocarcinoma of upper lobe of right lung (HCC)    a.) s/p posterior segmentectomy on 03/20/23 at [facility]   Anemia    Arthritis    Asthma    Carotid artery stenosis    a.) doppler 05/14/2017: 1-39% BICA; b.) doppler 04/05/2018: 50-69% BICA; c.) doppler 06/12/2019: 40-59% BICA; d.) doppler 06/14/2020, 06/14/2021: 1-39% BICA; e.) doppler 06/19/2022: 1.39% RICA, 40-59% LICA   Colon polyp    COPD (chronic obstructive pulmonary disease) (HCC)    Coronary artery disease 04/03/2006   a.) LHC 04/03/2006: 25% mLM, 25% mLAD, 50% D1, 25/50% mLCx, 50% dLCx, 25% OM2, 100% pRCA --> med mgmt; b.) LHC 04/25/2010: 25% pLM, 25% pLAD, 50% pLCx, 70% dLCx, 100% pRCA --> med mgmt   Depression    Diastolic dysfunction    a.) TTE 04/05/2018: EF 65%, no RWMAs, G1DD, triv MR/TR; b.) TTE 10/15/2022: EF 88%, mod LVH, G1DD, triv TR, mild MR; c.) TTE 02/27/2023: EF 65%, no RWMAs, triv TR/MR, RVSP 35; d.) TTE 03/21/2023: EF 65%, no RWMAs, norm RVSF, PASP 44   Environmental allergies    Headache    Hyperlipidemia    Hypertension    Hypoxic respiratory failure (HCC)    Internal hemorrhoids    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[facility])    a.) thinks it was in 2007 --> LHC 04/03/2006 revealed a 100% pRCA --> medically managed   Necrotizing pneumonia (HCC)    Pulmonary HTN (HCC)    a.) TTE 02/27/2023: RVSP 35 mmHg; b.) TTE 03/21/2023: PASP 44 mmHg   PVD  (peripheral vascular disease) with claudication (HCC)    Shortness of breath dyspnea    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    Substance abuse (HCC)    Substance induced mood disorder (HCC)    Suicidal ideation    Thrombocytopenia (HCC)    Tobacco abuse    Unstable angina (HCC)    Ventral hernia    Vitamin D deficiency      Past Surgical History: Past Surgical History:  Procedure Laterality Date   ABDOMINAL HYSTERECTOMY     APPENDECTOMY     CARDIAC CATHETERIZATION     CARDIAC SURGERY     CHOLECYSTECTOMY     COLONOSCOPY  12/15/2011   OH->bleeding internal hemorrhoids, otherwise normal   COLONOSCOPY WITH PROPOFOL N/A 04/12/2015   Procedure: COLONOSCOPY WITH PROPOFOL;  Surgeon: Midge Minium, MD;  Location: [facility] ENDOSCOPY;  Service: Endoscopy;  Laterality: N/A;   COLONOSCOPY WITH PROPOFOL N/A 01/05/2020   Procedure: COLONOSCOPY WITH PROPOFOL;  Surgeon: Midge Minium, MD;  Location: [facility] ENDOSCOPY;  Service: Endoscopy;  Laterality: N/A;   ENDARTERECTOMY FEMORAL Left 04/26/2015   Procedure: ENDARTERECTOMY FEMORAL;  Surgeon: Annice Needy, MD;  Location: [facility] ORS;  Service: Vascular;  Laterality: Left;   ENDOVASCULAR STENT INSERTION Bilateral    Legs   FLEXIBLE BRONCHOSCOPY N/A 02/02/2015   Procedure: FLEXIBLE BRONCHOSCOPY;  Surgeon: Yevonne Pax, MD;  Location:  [facility] ORS;  Service: Pulmonary;  Laterality: N/A;   HEMORRHOID SURGERY     lung mass removed     PERIPHERAL VASCULAR CATHETERIZATION Left 04/25/2015   Procedure: Lower Extremity Angiography;  Surgeon: Annice Needy, MD;  Location: [facility];  Service: Cardiovascular;  Laterality: Left;   PERIPHERAL VASCULAR CATHETERIZATION Left 04/25/2015   Procedure: Lower Extremity Intervention;  Surgeon: Annice Needy, MD;  Location: [facility];  Service: Cardiovascular;  Laterality: Left;   PERIPHERAL VASCULAR CATHETERIZATION Left 01/02/2016   Procedure: Lower Extremity Angiography;  Surgeon: Annice Needy, MD;   Location: [facility];  Service: Cardiovascular;  Laterality: Left;   PERIPHERAL VASCULAR CATHETERIZATION  01/02/2016   Procedure: Lower Extremity Intervention;  Surgeon: Annice Needy, MD;  Location: [facility];  Service: Cardiovascular;;   THROMBECTOMY FEMORAL ARTERY Left 04/26/2015   Procedure: THROMBECTOMY FEMORAL ARTERY;  Surgeon: Annice Needy, MD;  Location: [facility] ORS;  Service: Vascular;  Laterality: Left;   VENTRAL HERNIA REPAIR N/A 07/25/2023   Procedure: HERNIA REPAIR VENTRAL ADULT, open;  Surgeon: Henrene Dodge, MD;  Location: [facility] ORS;  Service: General;  Laterality: N/A;    Home Medications: Prior to Admission medications   Medication Sig Start Date End Date Taking? Authorizing Provider  acetaminophen (TYLENOL) 500 MG tablet Take 2 tablets (1,000 mg total) by mouth every 6 (six) hours as needed for mild pain (pain score 1-3). 07/25/23  Yes Jestine Bicknell, MD  albuterol (VENTOLIN HFA) 108 (90 Base) MCG/ACT inhaler INHALE 2 PUFFS BY MOUTH EVERY 6 HOURS AS NEEDED FOR WHEEZING FOR SHORTNESS OF BREATH 01/21/23  Yes Abernathy, Alyssa, NP  aspirin EC 81 MG EC tablet Take 1 tablet (81 mg total) by mouth daily. 04/29/15  Yes Annice Needy, MD  atorvastatin (LIPITOR) 80 MG tablet Take 1 tablet (80 mg total) by mouth daily. 04/10/23  Yes Abernathy, Alyssa, NP  budesonide-formoterol (SYMBICORT) 80-4.5 MCG/ACT inhaler INHALE 2 PUFFS BY MOUTH ONCE DAILY Patient taking differently: Inhale 2 puffs into the lungs 2 (two) times daily as needed (respiratory issues.). 11/23/22  Yes Abernathy, Arlyss Repress, NP  Cholecalciferol (VITAMIN D-3 PO) Take 1 tablet by mouth daily.   Yes [provider]  cyclobenzaprine (FLEXERIL) 5 MG tablet Take 1 tablet (5 mg total) by mouth 3 (three) times daily as needed for muscle spasms. 08/09/23  Yes Divya Munshi, Elita Quick, MD  ezetimibe (ZETIA) 10 MG tablet Take 1 tablet (10 mg total) by mouth daily. 04/10/23  Yes Abernathy, Arlyss Repress, NP  ferrous sulfate 325 (65 FE) MG  tablet Take 325 mg by mouth daily.   Yes [provider]  folic acid (FOLVITE) 1 MG tablet Take 1 tablet (1 mg total) by mouth daily. 06/21/23  Yes Lewanda Rife, MD  gabapentin (NEURONTIN) 100 MG capsule Take 1 capsule (100 mg total) by mouth 3 (three) times daily. 06/20/23  Yes Lewanda Rife, MD  losartan (COZAAR) 50 MG tablet Take 2 tablets (100 mg total) by mouth daily. 04/10/23  Yes Abernathy, Arlyss Repress, NP  metoprolol succinate (TOPROL-XL) 50 MG 24 hr tablet TAKE 1 TABLET BY MOUTH ONCE DAILY WITH  OR  IMMEDIATELY  FOLLOWING  A  MEAL 01/18/23  Yes Laurier Nancy, MD  Multiple Vitamin (MULTIVITAMIN WITH MINERALS) TABS tablet Take 1 tablet by mouth daily. 06/21/23  Yes Lewanda Rife, MD  nicotine (NICODERM CQ - DOSED IN MG/24 HOURS) 14 mg/24hr patch Place 1 patch (14 mg total) onto the skin daily. 06/21/23  Yes Parmar,  Meenakshi, MD  oxyCODONE (ROXICODONE) 5 MG immediate release tablet Take 1 tablet (5 mg total) by mouth every 4 (four) hours as needed. 08/09/23 08/08/24 Yes Owyn Raulston, Elita Quick, MD  OXYGEN Inhale 2.5 L/hr into the lungs as needed.   Yes [provider]  thiamine (VITAMIN B-1) 100 MG tablet Take 1 tablet (100 mg total) by mouth daily. 06/21/23  Yes Lewanda Rife, MD    Allergies: Allergies  Allergen Reactions   Aspirin Nausea And Vomiting and Other (See Comments)    Pt states aspirin makes her cramp and have to use coated kind.   Zyrtec [Cetirizine] Other (See Comments)    Upset stomach   Tramadol Rash    Review of Systems: Review of Systems  Constitutional:  Negative for chills and fever.  Respiratory:  Negative for shortness of breath.   Cardiovascular:  Negative for chest pain.  Gastrointestinal:  Positive for abdominal pain. Negative for nausea and vomiting.  Skin:  Negative for rash.    Physical Exam BP (!) 170/95   Pulse 78   Temp 97.9 F (36.6 C) (Oral)   Ht 5\' 4"  (1.626 m)   Wt 107 lb 12.8 oz (48.9 kg)   LMP  (LMP Unknown)   BMI  18.50 kg/m  CONSTITUTIONAL: No acute distress, well-nourished HEENT:  Normocephalic, atraumatic, extraocular motion intact. RESPIRATORY:  Normal respiratory effort without pathologic use of accessory muscles. CARDIOVASCULAR: Regular rhythm and rate. GI: The abdomen is soft, nondistended, with tenderness to palpation in the epigastric area at the site of her hernia repair.  There is no evidence of any hernia recurrence.  The incision itself is healing well and is clean, dry, intact.  NEUROLOGIC:  Motor and sensation is grossly normal.  Cranial nerves are grossly intact. PSYCH:  Alert and oriented to person, place and time. Affect is normal.   Assessment and Plan: This is a 67 y.o. female status post open incarcerated incisional epigastric hernia repair.  - Discussed with patient that the pain may be happening more because of the location of the hernia repair.  Being in the epigastric area, just below the xiphoid process, this may be much more prone to pushing and irritation from her diaphragm as it pushes down during respiration.  She may also feel this more as she is turning her body and moving in different directions.  Discussed with patient that on exam at least there is no any suspicions for complications and there is no evidence of any hernia recurrence.  I think this will continue to improve as the area becomes less inflamed and heal better. - As a precaution, we will give the patient a refill on her oxycodone as well as a prescription for Flexeril to help with any potential muscle issues around this area. - For now the patient may follow-up as needed but discussed with her that if in the next 2 weeks there is no improvement in her pain or if there is any worsening at all, she should let us know right away so we can see her and potentially do further imaging. - Otherwise follow-up as needed.  I spent 20 minutes dedicated to the care of this patient on the date of this encounter to include  pre-visit review of records, face-to-face time with the patient discussing diagnosis and management, and any post-visit coordination of care.   Howie Ill, MD [facility]

## 2023-08-09 NOTE — Patient Instructions (Signed)

## 2023-08-27 DIAGNOSIS — R911 Solitary pulmonary nodule: Secondary | ICD-10-CM | POA: Diagnosis not present

## 2023-09-02 ENCOUNTER — Ambulatory Visit
Admission: RE | Admit: 2023-09-02 | Discharge: 2023-09-02 | Disposition: A | Discharge status: Home / Self Care | Payer: 59 | Source: Ambulatory Visit | Attending: Nurse Practitioner | Admitting: Nurse Practitioner

## 2023-09-02 DIAGNOSIS — Z1231 Encounter for screening mammogram for malignant neoplasm of breast: Secondary | ICD-10-CM | POA: Diagnosis not present

## 2023-09-16 ENCOUNTER — Ambulatory Visit: Payer: Medicaid Other | Admitting: Cardiovascular Disease

## 2023-09-21 ENCOUNTER — Encounter: Payer: Self-pay | Admitting: Nurse Practitioner

## 2023-09-24 ENCOUNTER — Encounter: Payer: Self-pay | Admitting: Cardiovascular Disease

## 2023-09-24 ENCOUNTER — Ambulatory Visit (INDEPENDENT_AMBULATORY_CARE_PROVIDER_SITE_OTHER): Payer: 59 | Admitting: Cardiovascular Disease

## 2023-09-24 VITALS — BP 108/72 | HR 85 | Ht 64.0 in | Wt 105.5 lb

## 2023-09-24 DIAGNOSIS — I2583 Coronary atherosclerosis due to lipid rich plaque: Secondary | ICD-10-CM | POA: Diagnosis not present

## 2023-09-24 DIAGNOSIS — I70213 Atherosclerosis of native arteries of extremities with intermittent claudication, bilateral legs: Secondary | ICD-10-CM | POA: Diagnosis not present

## 2023-09-24 DIAGNOSIS — F17219 Nicotine dependence, cigarettes, with unspecified nicotine-induced disorders: Secondary | ICD-10-CM

## 2023-09-24 DIAGNOSIS — Z8639 Personal history of other endocrine, nutritional and metabolic disease: Secondary | ICD-10-CM | POA: Diagnosis not present

## 2023-09-24 DIAGNOSIS — I2510 Atherosclerotic heart disease of native coronary artery without angina pectoris: Secondary | ICD-10-CM

## 2023-09-24 DIAGNOSIS — I70211 Atherosclerosis of native arteries of extremities with intermittent claudication, right leg: Secondary | ICD-10-CM

## 2023-09-24 DIAGNOSIS — I10 Essential (primary) hypertension: Secondary | ICD-10-CM | POA: Diagnosis not present

## 2023-09-24 NOTE — Progress Notes (Signed)
Cardiology Office Note   Date:  09/24/2023   ID:  [name], DOB 05-17-1956, MRN [id number]  PCP:  Sallyanne Kuster, NP  Cardiologist:  Adrian Blackwater, MD      History of Present Illness: [name] is a 68 y.o. female who presents for  Chief Complaint  Patient presents with   Follow-up    2 month follow up     No chest pain or SOB      Past Medical History:  Diagnosis Date   Adenocarcinoma of upper lobe of right lung (HCC)    a.) s/p posterior segmentectomy on 03/20/23 at [facility]   Anemia    Arthritis    Asthma    Carotid artery stenosis    a.) doppler 05/14/2017: 1-39% BICA; b.) doppler 04/05/2018: 50-69% BICA; c.) doppler 06/12/2019: 40-59% BICA; d.) doppler 06/14/2020, 06/14/2021: 1-39% BICA; e.) doppler 06/19/2022: 1.39% RICA, 40-59% LICA   Colon polyp    COPD (chronic obstructive pulmonary disease) (HCC)    Coronary artery disease 04/03/2006   a.) LHC 04/03/2006: 25% mLM, 25% mLAD, 50% D1, 25/50% mLCx, 50% dLCx, 25% OM2, 100% pRCA --> med mgmt; b.) LHC 04/25/2010: 25% pLM, 25% pLAD, 50% pLCx, 70% dLCx, 100% pRCA --> med mgmt   Depression    Diastolic dysfunction    a.) TTE 04/05/2018: EF 65%, no RWMAs, G1DD, triv MR/TR; b.) TTE 10/15/2022: EF 88%, mod LVH, G1DD, triv TR, mild MR; c.) TTE 02/27/2023: EF 65%, no RWMAs, triv TR/MR, RVSP 35; d.) TTE 03/21/2023: EF 65%, no RWMAs, norm RVSF, PASP 44   Environmental allergies    Headache    Hyperlipidemia    Hypertension    Hypoxic respiratory failure (HCC)    Internal hemorrhoids    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[facility])    a.) thinks it was in 2007 --> LHC 04/03/2006 revealed a 100% pRCA --> medically managed   Necrotizing pneumonia (HCC)    Pulmonary HTN (HCC)    a.) TTE 02/27/2023: RVSP 35 mmHg; b.) TTE 03/21/2023: PASP 44 mmHg   PVD (peripheral vascular disease) with claudication (HCC)    Shortness of breath dyspnea    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    Substance abuse (HCC)    Substance induced  mood disorder (HCC)    Suicidal ideation    Thrombocytopenia (HCC)    Tobacco abuse    Unstable angina (HCC)    Ventral hernia    Vitamin D deficiency      Past Surgical History:  Procedure Laterality Date   ABDOMINAL HYSTERECTOMY     APPENDECTOMY     CARDIAC CATHETERIZATION     CARDIAC SURGERY     CHOLECYSTECTOMY     COLONOSCOPY  12/15/2011   OH->bleeding internal hemorrhoids, otherwise normal   COLONOSCOPY WITH PROPOFOL N/A 04/12/2015   Procedure: COLONOSCOPY WITH PROPOFOL;  Surgeon: Midge Minium, MD;  Location: [facility] ENDOSCOPY;  Service: Endoscopy;  Laterality: N/A;   COLONOSCOPY WITH PROPOFOL N/A 01/05/2020   Procedure: COLONOSCOPY WITH PROPOFOL;  Surgeon: Midge Minium, MD;  Location: [facility] ENDOSCOPY;  Service: Endoscopy;  Laterality: N/A;   ENDARTERECTOMY FEMORAL Left 04/26/2015   Procedure: ENDARTERECTOMY FEMORAL;  Surgeon: Annice Needy, MD;  Location: [facility] ORS;  Service: Vascular;  Laterality: Left;   ENDOVASCULAR STENT INSERTION Bilateral    Legs   FLEXIBLE BRONCHOSCOPY N/A 02/02/2015   Procedure: FLEXIBLE BRONCHOSCOPY;  Surgeon: Yevonne Pax, MD;  Location: [facility] ORS;  Service: Pulmonary;  Laterality: N/A;   HEMORRHOID SURGERY  lung mass removed     PERIPHERAL VASCULAR CATHETERIZATION Left 04/25/2015   Procedure: Lower Extremity Angiography;  Surgeon: Annice Needy, MD;  Location: [facility] INVASIVE CV LAB;  Service: Cardiovascular;  Laterality: Left;   PERIPHERAL VASCULAR CATHETERIZATION Left 04/25/2015   Procedure: Lower Extremity Intervention;  Surgeon: Annice Needy, MD;  Location: [facility] INVASIVE CV LAB;  Service: Cardiovascular;  Laterality: Left;   PERIPHERAL VASCULAR CATHETERIZATION Left 01/02/2016   Procedure: Lower Extremity Angiography;  Surgeon: Annice Needy, MD;  Location: [facility] INVASIVE CV LAB;  Service: Cardiovascular;  Laterality: Left;   PERIPHERAL VASCULAR CATHETERIZATION  01/02/2016   Procedure: Lower Extremity Intervention;  Surgeon: Annice Needy, MD;  Location:  [facility] INVASIVE CV LAB;  Service: Cardiovascular;;   THROMBECTOMY FEMORAL ARTERY Left 04/26/2015   Procedure: THROMBECTOMY FEMORAL ARTERY;  Surgeon: Annice Needy, MD;  Location: [facility] ORS;  Service: Vascular;  Laterality: Left;   VENTRAL HERNIA REPAIR N/A 07/25/2023   Procedure: HERNIA REPAIR VENTRAL ADULT, open;  Surgeon: Henrene Dodge, MD;  Location: [facility] ORS;  Service: General;  Laterality: N/A;     Current Outpatient Medications  Medication Sig Dispense Refill   acetaminophen (TYLENOL) 500 MG tablet Take 2 tablets (1,000 mg total) by mouth every 6 (six) hours as needed for mild pain (pain score 1-3).     albuterol (VENTOLIN HFA) 108 (90 Base) MCG/ACT inhaler INHALE 2 PUFFS BY MOUTH EVERY 6 HOURS AS NEEDED FOR WHEEZING FOR SHORTNESS OF BREATH 9 g 0   aspirin EC 81 MG EC tablet Take 1 tablet (81 mg total) by mouth daily. 90 tablet 1   atorvastatin (LIPITOR) 80 MG tablet Take 1 tablet (80 mg total) by mouth daily. 90 tablet 3   budesonide-formoterol (SYMBICORT) 80-4.5 MCG/ACT inhaler INHALE 2 PUFFS BY MOUTH ONCE DAILY (Patient taking differently: Inhale 2 puffs into the lungs 2 (two) times daily as needed (respiratory issues.).) 22 g 2   Cholecalciferol (VITAMIN D-3 PO) Take 1 tablet by mouth daily.     cyclobenzaprine (FLEXERIL) 5 MG tablet Take 1 tablet (5 mg total) by mouth 3 (three) times daily as needed for muscle spasms. 30 tablet 0   ezetimibe (ZETIA) 10 MG tablet Take 1 tablet (10 mg total) by mouth daily. 90 tablet 3   ferrous sulfate 325 (65 FE) MG tablet Take 325 mg by mouth daily.     folic acid (FOLVITE) 1 MG tablet Take 1 tablet (1 mg total) by mouth daily. 15 tablet 0   gabapentin (NEURONTIN) 100 MG capsule Take 1 capsule (100 mg total) by mouth 3 (three) times daily. 90 capsule 0   losartan (COZAAR) 50 MG tablet Take 2 tablets (100 mg total) by mouth daily. 180 tablet 3   metoprolol succinate (TOPROL-XL) 50 MG 24 hr tablet TAKE 1 TABLET BY MOUTH ONCE DAILY WITH  OR  IMMEDIATELY   FOLLOWING  A  MEAL 90 tablet 0   Multiple Vitamin (MULTIVITAMIN WITH MINERALS) TABS tablet Take 1 tablet by mouth daily. 30 tablet 0   nicotine (NICODERM CQ - DOSED IN MG/24 HOURS) 14 mg/24hr patch Place 1 patch (14 mg total) onto the skin daily. 28 patch 0   oxyCODONE (ROXICODONE) 5 MG immediate release tablet Take 1 tablet (5 mg total) by mouth every 4 (four) hours as needed. 30 tablet 0   OXYGEN Inhale 2.5 L/hr into the lungs as needed.     thiamine (VITAMIN B-1) 100 MG tablet Take 1 tablet (100 mg total) by mouth daily. 15  tablet 0   No current facility-administered medications for this visit.    Allergies:   Aspirin, Zyrtec [cetirizine], and Tramadol    Social History:   reports that she has been smoking cigarettes. She has been exposed to tobacco smoke. She has never used smokeless tobacco. She reports current alcohol use. She reports that she does not use drugs.   Family History:  family history includes Hypertension in her father and mother.    ROS:     Review of Systems  Constitutional: Negative.   HENT: Negative.    Eyes: Negative.   Respiratory: Negative.    Gastrointestinal: Negative.   Genitourinary: Negative.   Musculoskeletal: Negative.   Skin: Negative.   Neurological: Negative.   Endo/Heme/Allergies: Negative.   Psychiatric/Behavioral: Negative.    All other systems reviewed and are negative.     All other systems are reviewed and negative.    PHYSICAL EXAM: VS:  BP 108/72   Pulse 85   Ht 5\' 4"  (1.626 m)   Wt 105 lb 8 oz (47.9 kg)   LMP  (LMP Unknown)   SpO2 98%   BMI 18.11 kg/m  , BMI Body mass index is 18.11 kg/m. Last weight:  Wt Readings from Last 3 Encounters:  09/24/23 105 lb 8 oz (47.9 kg)  08/09/23 107 lb 12.8 oz (48.9 kg)  07/25/23 108 lb (49 kg)     Physical Exam Constitutional:      Appearance: Normal appearance.  Cardiovascular:     Rate and Rhythm: Normal rate and regular rhythm.     Heart sounds: Normal heart sounds.   Pulmonary:     Effort: Pulmonary effort is normal.     Breath sounds: Normal breath sounds.  Musculoskeletal:     Right lower leg: No edema.     Left lower leg: No edema.  Neurological:     Mental Status: She is alert.       EKG:   Recent Labs: 06/11/2023: ALT 27; BUN 16; Creatinine, Ser 0.90; Hemoglobin 12.3; Platelets 201; Potassium 3.3; Sodium 140    Lipid Panel    Component Value Date/Time   CHOL 202 (H) 06/18/2023 0707   CHOL 149 05/28/2022 1103   CHOL 147 10/16/2013 0439   TRIG 186 (H) 06/18/2023 0707   TRIG 122 10/16/2013 0439   HDL 61 06/18/2023 0707   HDL 79 05/28/2022 1103   HDL 31 (L) 10/16/2013 0439   CHOLHDL 3.3 06/18/2023 0707   VLDL 37 06/18/2023 0707   VLDL 24 10/16/2013 0439   LDLCALC 104 (H) 06/18/2023 0707   LDLCALC 55 05/28/2022 1103   LDLCALC 92 10/16/2013 0439      Other studies Reviewed: Additional studies/ records that were reviewed today include:  Review of the above records demonstrates:       No data to display            ASSESSMENT AND PLAN:    ICD-10-CM   1. Essential hypertension  I10     2. Cigarette nicotine dependence with nicotine-induced disorder  F17.219     3. Atherosclerosis of native artery of both lower extremities with intermittent claudication (HCC)  I70.213     4. Coronary artery disease due to lipid rich plaque  I25.10    I25.83    Need to continue asp, and statins    5. H/O hypercholesterolemia  Z86.39    LDL at goal 55    6. Atherosclerosis of native artery of right lower extremity with intermittent  claudication (HCC)  I70.211        Problem List Items Addressed This Visit       Cardiovascular and Mediastinum   Essential hypertension - Primary   Atherosclerosis of native arteries of extremity with intermittent claudication (HCC)   Coronary artery disease due to lipid rich plaque     Nervous and Auditory   Cigarette nicotine dependence with nicotine-induced disorder     Other   H/O  hypercholesterolemia       Disposition:   Return in about 3 months (around 12/23/2023).    Total time spent: 30 minutes  Signed,  Adrian Blackwater, MD  09/24/2023 10:33 AM    [facility]

## 2023-09-27 DIAGNOSIS — R911 Solitary pulmonary nodule: Secondary | ICD-10-CM | POA: Diagnosis not present

## 2023-10-08 DIAGNOSIS — Z85118 Personal history of other malignant neoplasm of bronchus and lung: Secondary | ICD-10-CM | POA: Diagnosis not present

## 2023-10-08 DIAGNOSIS — I252 Old myocardial infarction: Secondary | ICD-10-CM | POA: Diagnosis not present

## 2023-10-08 DIAGNOSIS — I2510 Atherosclerotic heart disease of native coronary artery without angina pectoris: Secondary | ICD-10-CM | POA: Diagnosis not present

## 2023-10-08 DIAGNOSIS — J449 Chronic obstructive pulmonary disease, unspecified: Secondary | ICD-10-CM | POA: Diagnosis not present

## 2023-10-08 DIAGNOSIS — Z79899 Other long term (current) drug therapy: Secondary | ICD-10-CM | POA: Diagnosis not present

## 2023-10-08 DIAGNOSIS — R918 Other nonspecific abnormal finding of lung field: Secondary | ICD-10-CM | POA: Diagnosis not present

## 2023-10-08 DIAGNOSIS — F17210 Nicotine dependence, cigarettes, uncomplicated: Secondary | ICD-10-CM | POA: Diagnosis not present

## 2023-10-08 DIAGNOSIS — Z08 Encounter for follow-up examination after completed treatment for malignant neoplasm: Secondary | ICD-10-CM | POA: Diagnosis not present

## 2023-10-08 DIAGNOSIS — Z902 Acquired absence of lung [part of]: Secondary | ICD-10-CM | POA: Diagnosis not present

## 2023-10-08 DIAGNOSIS — J984 Other disorders of lung: Secondary | ICD-10-CM | POA: Diagnosis not present

## 2023-10-28 DIAGNOSIS — R911 Solitary pulmonary nodule: Secondary | ICD-10-CM | POA: Diagnosis not present

## 2023-11-06 ENCOUNTER — Telehealth: Payer: Self-pay

## 2023-11-06 NOTE — Telephone Encounter (Signed)
 Case manager from [employer] [facility] [phone] to let us know that she is going call pt every 6 month to check on her how she is doing and advised her pt follow up her appt always

## 2023-11-11 ENCOUNTER — Other Ambulatory Visit: Payer: Self-pay

## 2023-11-11 DIAGNOSIS — J449 Chronic obstructive pulmonary disease, unspecified: Secondary | ICD-10-CM

## 2023-11-11 MED ORDER — ALBUTEROL SULFATE HFA 108 (90 BASE) MCG/ACT IN AERS: 2.0000 | INHALATION_SPRAY | RESPIRATORY_TRACT | 0 refills | Status: DC | PRN

## 2023-11-11 MED ORDER — BUDESONIDE-FORMOTEROL FUMARATE 80-4.5 MCG/ACT IN AERO: INHALATION_SPRAY | RESPIRATORY_TRACT | 2 refills | Status: DC

## 2023-11-25 DIAGNOSIS — R911 Solitary pulmonary nodule: Secondary | ICD-10-CM | POA: Diagnosis not present

## 2023-12-23 ENCOUNTER — Encounter: Payer: Self-pay | Admitting: Physician Assistant

## 2023-12-23 ENCOUNTER — Other Ambulatory Visit: Payer: Self-pay | Admitting: Physician Assistant

## 2023-12-23 ENCOUNTER — Ambulatory Visit (INDEPENDENT_AMBULATORY_CARE_PROVIDER_SITE_OTHER): Admitting: Physician Assistant

## 2023-12-23 ENCOUNTER — Ambulatory Visit: Payer: 59 | Admitting: Cardiovascular Disease

## 2023-12-23 VITALS — BP 138/80 | HR 88 | Temp 98.6°F | Resp 16 | Ht 64.0 in | Wt 103.0 lb

## 2023-12-23 DIAGNOSIS — R197 Diarrhea, unspecified: Secondary | ICD-10-CM

## 2023-12-23 DIAGNOSIS — R6889 Other general symptoms and signs: Secondary | ICD-10-CM

## 2023-12-23 DIAGNOSIS — R10817 Generalized abdominal tenderness: Secondary | ICD-10-CM | POA: Diagnosis not present

## 2023-12-23 DIAGNOSIS — J449 Chronic obstructive pulmonary disease, unspecified: Secondary | ICD-10-CM

## 2023-12-23 DIAGNOSIS — K921 Melena: Secondary | ICD-10-CM | POA: Diagnosis not present

## 2023-12-23 LAB — POCT INFLUENZA A/B
Influenza A, POC: NEGATIVE
Influenza B, POC: NEGATIVE

## 2023-12-23 MED ORDER — BUDESONIDE-FORMOTEROL FUMARATE 80-4.5 MCG/ACT IN AERO: INHALATION_SPRAY | RESPIRATORY_TRACT | 2 refills | Status: DC

## 2023-12-23 MED ORDER — ALBUTEROL SULFATE HFA 108 (90 BASE) MCG/ACT IN AERS: 2.0000 | INHALATION_SPRAY | RESPIRATORY_TRACT | 1 refills | Status: DC | PRN

## 2023-12-23 NOTE — Progress Notes (Signed)
 [facility] [address] [place], [place] [address]  Internal MEDICINE  Office Visit Note  Patient Name: [name]  [id number]  [id number]  Date of Service: 12/23/2023  Chief Complaint  Patient presents with   Acute Visit    Covid negative    Cough   Sinusitis   Generalized Body Aches    Flu like symptoms      HPI Pt is here for a sick visit. -Been feeling unwell for about a week and getting worse -body aches, coughing up clear sputum, runny nose -Drinking enough to take meds, but not able to eat much for the last week. -Diarrhea daily for last week, vomiting twice in past 2 days. -not sleeping well due to coughing.  -using cough drops, no nasal sprays or mucinex --some S/E from these in the past -has been out of her inhalers since yesterday. Refill sent in March per chart but pt never got them, will resend -Reduced appetite, tenderness along abdomen to even slightest touch. Has seen blood in stools and reports having hemorrhoids and isnt new for her. She is shaking in office some and reports some twitching. -Advised on going to ED due to dehydration/electrolyte abnormality concerns with very tender to slight touch in abdomen.  Current Medication:  Outpatient Encounter Medications as of 12/23/2023  Medication Sig Note   acetaminophen  (TYLENOL ) 500 MG tablet Take 2 tablets (1,000 mg total) by mouth every 6 (six) hours as needed for mild pain (pain score 1-3).    albuterol  (VENTOLIN  HFA) 108 (90 Base) MCG/ACT inhaler Inhale 2 puffs into the lungs every 4 (four) hours as needed for wheezing or shortness of breath.    aspirin  EC 81 MG EC tablet Take 1 tablet (81 mg total) by mouth daily.    atorvastatin  (LIPITOR ) 80 MG tablet Take 1 tablet (80 mg total) by mouth daily.    Cholecalciferol  (VITAMIN D -3 PO) Take 1 tablet by mouth daily.    cyclobenzaprine  (FLEXERIL ) 5 MG tablet Take 1 tablet (5 mg total) by mouth 3 (three) times daily as needed for muscle spasms.     ezetimibe  (ZETIA ) 10 MG tablet Take 1 tablet (10 mg total) by mouth daily.    ferrous sulfate  325 (65 FE) MG tablet Take 325 mg by mouth daily.    folic acid  (FOLVITE ) 1 MG tablet Take 1 tablet (1 mg total) by mouth daily.    gabapentin  (NEURONTIN ) 100 MG capsule Take 1 capsule (100 mg total) by mouth 3 (three) times daily. 07/15/2023: On hold per patient needs refills   losartan  (COZAAR ) 50 MG tablet Take 2 tablets (100 mg total) by mouth daily.    metoprolol  succinate (TOPROL -XL) 50 MG 24 hr tablet TAKE 1 TABLET BY MOUTH ONCE DAILY WITH  OR  IMMEDIATELY  FOLLOWING  A  MEAL    Multiple Vitamin (MULTIVITAMIN WITH MINERALS) TABS tablet Take 1 tablet by mouth daily.    nicotine  (NICODERM CQ  - DOSED IN MG/24 HOURS) 14 mg/24hr patch Place 1 patch (14 mg total) onto the skin daily.    oxyCODONE  (ROXICODONE ) 5 MG immediate release tablet Take 1 tablet (5 mg total) by mouth every 4 (four) hours as needed.    OXYGEN  Inhale 2.5 L/hr into the lungs as needed.    thiamine  (VITAMIN B-1) 100 MG tablet Take 1 tablet (100 mg total) by mouth daily.    [DISCONTINUED] albuterol  (VENTOLIN  HFA) 108 (90 Base) MCG/ACT inhaler Inhale 2 puffs into the lungs every 4 (four) hours as needed  for wheezing or shortness of breath.    [DISCONTINUED] budesonide -formoterol  (SYMBICORT ) 80-4.5 MCG/ACT inhaler INHALE 2 PUFFS BY MOUTH ONCE DAILY    [DISCONTINUED] budesonide -formoterol  (SYMBICORT ) 80-4.5 MCG/ACT inhaler INHALE 2 PUFFS BY MOUTH ONCE DAILY    No facility-administered encounter medications on file as of 12/23/2023.      Medical History: Past Medical History:  Diagnosis Date   Adenocarcinoma of upper lobe of right lung (HCC)    a.) s/p posterior segmentectomy on 03/20/23 at [facility]   Anemia    Arthritis    Asthma    Carotid artery stenosis    a.) doppler 05/14/2017: 1-39% BICA; b.) doppler 04/05/2018: 50-69% BICA; c.) doppler 06/12/2019: 40-59% BICA; d.) doppler 06/14/2020, 06/14/2021: 1-39% BICA; e.) doppler  06/19/2022: 1.39% RICA, 40-59% LICA   Colon polyp    COPD (chronic obstructive pulmonary disease) (HCC)    Coronary artery disease 04/03/2006   a.) LHC 04/03/2006: 25% mLM, 25% mLAD, 50% D1, 25/50% mLCx, 50% dLCx, 25% OM2, 100% pRCA --> med mgmt; b.) LHC 04/25/2010: 25% pLM, 25% pLAD, 50% pLCx, 70% dLCx, 100% pRCA --> med mgmt   Depression    Diastolic dysfunction    a.) TTE 04/05/2018: EF 65%, no RWMAs, G1DD, triv MR/TR; b.) TTE 10/15/2022: EF 88%, mod LVH, G1DD, triv TR, mild MR; c.) TTE 02/27/2023: EF 65%, no RWMAs, triv TR/MR, RVSP 35; d.) TTE 03/21/2023: EF 65%, no RWMAs, norm RVSF, PASP 44   Environmental allergies    Headache    Hyperlipidemia    Hypertension    Hypoxic respiratory failure (HCC)    Internal hemorrhoids    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[facility])    a.) thinks it was in 2007 --> LHC 04/03/2006 revealed a 100% pRCA --> medically managed   Necrotizing pneumonia (HCC)    Pulmonary HTN (HCC)    a.) TTE 02/27/2023: RVSP 35 mmHg; b.) TTE 03/21/2023: PASP 44 mmHg   PVD (peripheral vascular disease) with claudication (HCC)    Shortness of breath dyspnea    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  HENT:  Positive for postnasal drip and rhinorrhea. Negative for mouth sores.   Respiratory:  Positive for cough and shortness of breath. Negative for wheezing.   Cardiovascular:  Negative for chest pain.  Gastrointestinal:  Positive for abdominal pain, blood in stool, diarrhea, nausea and vomiting.  Genitourinary:  Negative for flank pain.  Skin:   Negative for rash.  Neurological:  Positive for weakness. Negative for dizziness.  Psychiatric/Behavioral:  Positive for sleep disturbance.     Physical Exam Vitals reviewed.  Constitutional:      Appearance: Normal appearance. She is ill-appearing.  HENT:     Head: Normocephalic and atraumatic.  Eyes:     Extraocular Movements: Extraocular movements intact.  Cardiovascular:     Rate and Rhythm: Normal rate and regular rhythm.  Pulmonary:  Effort: Pulmonary effort is normal. No respiratory distress.     Breath sounds: Wheezing present.  Abdominal:     Tenderness: There is abdominal tenderness. There is guarding.     Comments: Pt tender and recoiling from even slightest palpation of abdomen throughout  Neurological:     Mental Status: She is alert and oriented to person, place, and time.  Psychiatric:        Mood and Affect: Mood normal.       Assessment/Plan: 1. Flu-like symptoms (Primary) - POCT Influenza A/B negative, however symptoms already present for 1 week at time of testing. Likely may have started as viral infection, but now concerns for dehydration/inability to eat and advised pt to go to ED.  2. Diarrhea, unspecified type Bloody diarrhea for the last week, concern for dehydration and advised to go to ED   3. Generalized abdominal tenderness, rebound tenderness presence not specified Very tender to even slight palpation, advised to go to ED for further evaluation  4. Blood in stool May be secondary to hemorrhoids, but concern for blood loss especially with abdominal tenderness requiring further evaluation in ED  5. Chronic obstructive pulmonary disease, unspecified COPD type (HCC) - albuterol  (VENTOLIN  HFA) 108 (90 Base) MCG/ACT inhaler; Inhale 2 puffs into the lungs every 4 (four) hours as needed for wheezing or shortness of breath.  Dispense: 9 g; Refill: 1   General Counseling: Alara verbalizes understanding of the findings of todays visit and agrees  with plan of treatment. I have discussed any further diagnostic evaluation that may be needed or ordered today. We also reviewed her medications today. she has been encouraged to call the office with any questions or concerns that should arise related to todays visit.    Counseling:    Orders Placed This Encounter  Procedures   POCT Influenza A/B    Meds ordered this encounter  Medications   DISCONTD: budesonide -formoterol  (SYMBICORT ) 80-4.5 MCG/ACT inhaler    Sig: INHALE 2 PUFFS BY MOUTH ONCE DAILY    Dispense:  22 g    Refill:  2   albuterol  (VENTOLIN  HFA) 108 (90 Base) MCG/ACT inhaler    Sig: Inhale 2 puffs into the lungs every 4 (four) hours as needed for wheezing or shortness of breath.    Dispense:  9 g    Refill:  1    Time spent:30 Minutes

## 2023-12-26 DIAGNOSIS — R911 Solitary pulmonary nodule: Secondary | ICD-10-CM | POA: Diagnosis not present

## 2024-01-07 DIAGNOSIS — Z08 Encounter for follow-up examination after completed treatment for malignant neoplasm: Secondary | ICD-10-CM | POA: Diagnosis not present

## 2024-01-07 DIAGNOSIS — Z85118 Personal history of other malignant neoplasm of bronchus and lung: Secondary | ICD-10-CM | POA: Diagnosis not present

## 2024-01-13 ENCOUNTER — Encounter: Payer: Self-pay | Admitting: Nurse Practitioner

## 2024-01-13 ENCOUNTER — Ambulatory Visit (INDEPENDENT_AMBULATORY_CARE_PROVIDER_SITE_OTHER): Payer: 59 | Admitting: Nurse Practitioner

## 2024-01-13 VITALS — BP 120/85 | HR 80 | Temp 97.9°F | Resp 16 | Ht 64.0 in | Wt 104.8 lb

## 2024-01-13 DIAGNOSIS — I2510 Atherosclerotic heart disease of native coronary artery without angina pectoris: Secondary | ICD-10-CM | POA: Diagnosis not present

## 2024-01-13 DIAGNOSIS — Z79899 Other long term (current) drug therapy: Secondary | ICD-10-CM

## 2024-01-13 DIAGNOSIS — F17209 Nicotine dependence, unspecified, with unspecified nicotine-induced disorders: Secondary | ICD-10-CM

## 2024-01-13 DIAGNOSIS — F1090 Alcohol use, unspecified, uncomplicated: Secondary | ICD-10-CM

## 2024-01-13 DIAGNOSIS — E782 Mixed hyperlipidemia: Secondary | ICD-10-CM

## 2024-01-13 DIAGNOSIS — I10 Essential (primary) hypertension: Secondary | ICD-10-CM | POA: Diagnosis not present

## 2024-01-13 DIAGNOSIS — I2583 Coronary atherosclerosis due to lipid rich plaque: Secondary | ICD-10-CM

## 2024-01-13 DIAGNOSIS — R109 Unspecified abdominal pain: Secondary | ICD-10-CM

## 2024-01-13 MED ORDER — ATORVASTATIN CALCIUM 80 MG PO TABS: 80.0000 mg | ORAL_TABLET | Freq: Every day | ORAL | 3 refills | Status: DC

## 2024-01-13 MED ORDER — LOSARTAN POTASSIUM 50 MG PO TABS: 100.0000 mg | ORAL_TABLET | Freq: Every day | ORAL | 3 refills | Status: DC

## 2024-01-13 MED ORDER — EZETIMIBE 10 MG PO TABS
10.0000 mg | ORAL_TABLET | Freq: Every day | ORAL | 3 refills | Status: DC
Start: 2024-01-13 — End: 2024-06-29

## 2024-01-13 MED ORDER — CYCLOBENZAPRINE HCL 5 MG PO TABS: 5.0000 mg | ORAL_TABLET | Freq: Every evening | ORAL | 3 refills | Status: DC | PRN

## 2024-01-13 MED ORDER — OXYCODONE HCL 5 MG PO TABS: 5.0000 mg | ORAL_TABLET | ORAL | 0 refills | Status: DC | PRN

## 2024-01-13 NOTE — Progress Notes (Signed)
 [facility] [address] [place], [place] [address]  Internal MEDICINE  Office Visit Note  Patient Name: [name]  [id number]  [id number]  Date of Service: 01/13/2024  Chief Complaint  Patient presents with   Depression   Hypertension   Hyperlipidemia   Follow-up    HPI Jesyca presents for a follow-up visit for hypertension, high cholesterol, smoking, alcohol use and abdominal pain.  Smoking cessation -- has been having telehealth visits with [facility] for smoking cessation but this is not helping. She has been using nicotine  patches which are not effective. She is still smoking. She is not interested in quitting at this time and declined any medication such as bupropion or chantix.  Hypertension -- controlled with current medications  High cholesterol -- on max dose of atorvastatin  and zetia .  Right abdominal pain s/p hernia surgery -- had a hernia surgery last year. Still having some pain in the right side of the abdomen. Takes oxycodone  and cyclobenzaprine  as needed to help with this pain.  Decreased alcohol intake to only on the weekends. Reports that she used to drink alcohol every day.    Current Medication: Outpatient Encounter Medications as of 01/13/2024  Medication Sig Note   acetaminophen  (TYLENOL ) 500 MG tablet Take 2 tablets (1,000 mg total) by mouth every 6 (six) hours as needed for mild pain (pain score 1-3).    albuterol  (VENTOLIN  HFA) 108 (90 Base) MCG/ACT inhaler Inhale 2 puffs into the lungs every 4 (four) hours as needed for wheezing or shortness of breath.    aspirin  EC 81 MG EC tablet Take 1 tablet (81 mg total) by mouth daily.    budesonide -formoterol  (SYMBICORT ) 80-4.5 MCG/ACT inhaler INHALE 2 PUFFS ONCE DAILY    Cholecalciferol  (VITAMIN D -3 PO) Take 1 tablet by mouth daily.    ferrous sulfate  325 (65 FE) MG tablet Take 325 mg by mouth daily.    gabapentin  (NEURONTIN ) 100 MG capsule Take 1 capsule (100 mg total) by mouth 3 (three) times daily.  07/15/2023: On hold per patient needs refills   metoprolol  succinate (TOPROL -XL) 50 MG 24 hr tablet TAKE 1 TABLET BY MOUTH ONCE DAILY WITH  OR  IMMEDIATELY  FOLLOWING  A  MEAL    Multiple Vitamin (MULTIVITAMIN WITH MINERALS) TABS tablet Take 1 tablet by mouth daily.    nicotine  (NICODERM CQ  - DOSED IN MG/24 HOURS) 14 mg/24hr patch Place 1 patch (14 mg total) onto the skin daily.    OXYGEN  Inhale 2.5 L/hr into the lungs as needed.    thiamine  (VITAMIN B-1) 100 MG tablet Take 1 tablet (100 mg total) by mouth daily.    [DISCONTINUED] atorvastatin  (LIPITOR ) 80 MG tablet Take 1 tablet (80 mg total) by mouth daily.    [DISCONTINUED] cyclobenzaprine  (FLEXERIL ) 5 MG tablet Take 1 tablet (5 mg total) by mouth 3 (three) times daily as needed for muscle spasms.    [DISCONTINUED] ezetimibe  (ZETIA ) 10 MG tablet Take 1 tablet (10 mg total) by mouth daily.    [DISCONTINUED] folic acid  (FOLVITE ) 1 MG tablet Take 1 tablet (1 mg total) by mouth daily.    [DISCONTINUED] losartan  (COZAAR ) 50 MG tablet Take 2 tablets (100 mg total) by mouth daily.    [DISCONTINUED] oxyCODONE  (ROXICODONE ) 5 MG immediate release tablet Take 1 tablet (5 mg total) by mouth every 4 (four) hours as needed.    atorvastatin  (LIPITOR ) 80 MG tablet Take 1 tablet (80 mg total) by mouth daily.    cyclobenzaprine  (FLEXERIL ) 5 MG tablet Take  1 tablet (5 mg total) by mouth at bedtime as needed for muscle spasms.    ezetimibe  (ZETIA ) 10 MG tablet Take 1 tablet (10 mg total) by mouth daily.    losartan  (COZAAR ) 50 MG tablet Take 2 tablets (100 mg total) by mouth daily.    oxyCODONE  (ROXICODONE ) 5 MG immediate release tablet Take 1 tablet (5 mg total) by mouth every 4 (four) hours as needed.    No facility-administered encounter medications on file as of 01/13/2024.    Surgical History: Past Surgical History:  Procedure Laterality Date   ABDOMINAL HYSTERECTOMY     APPENDECTOMY     CARDIAC CATHETERIZATION     CARDIAC SURGERY     CHOLECYSTECTOMY      COLONOSCOPY  12/15/2011   OH->bleeding internal hemorrhoids, otherwise normal   COLONOSCOPY WITH PROPOFOL  N/A 04/12/2015   Procedure: COLONOSCOPY WITH PROPOFOL ;  Surgeon: Marnee Sink, MD;  Location: [facility] ENDOSCOPY;  Service: Endoscopy;  Laterality: N/A;   COLONOSCOPY WITH PROPOFOL  N/A 01/05/2020   Procedure: COLONOSCOPY WITH PROPOFOL ;  Surgeon: Marnee Sink, MD;  Location: [facility] ENDOSCOPY;  Service: Endoscopy;  Laterality: N/A;   ENDARTERECTOMY FEMORAL Left 04/26/2015   Procedure: ENDARTERECTOMY FEMORAL;  Surgeon: Celso College, MD;  Location: [facility] ORS;  Service: Vascular;  Laterality: Left;   ENDOVASCULAR STENT INSERTION Bilateral    Legs   FLEXIBLE BRONCHOSCOPY N/A 02/02/2015   Procedure: FLEXIBLE BRONCHOSCOPY;  Surgeon: Cordie Deters, MD;  Location: [facility] ORS;  Service: Pulmonary;  Laterality: N/A;   HEMORRHOID SURGERY     lung mass removed     PERIPHERAL VASCULAR CATHETERIZATION Left 04/25/2015   Procedure: Lower Extremity Angiography;  Surgeon: Celso College, MD;  Location: [facility] INVASIVE CV LAB;  Service: Cardiovascular;  Laterality: Left;   PERIPHERAL VASCULAR CATHETERIZATION Left 04/25/2015   Procedure: Lower Extremity Intervention;  Surgeon: Celso College, MD;  Location: [facility] INVASIVE CV LAB;  Service: Cardiovascular;  Laterality: Left;   PERIPHERAL VASCULAR CATHETERIZATION Left 01/02/2016   Procedure: Lower Extremity Angiography;  Surgeon: Celso College, MD;  Location: [facility] INVASIVE CV LAB;  Service: Cardiovascular;  Laterality: Left;   PERIPHERAL VASCULAR CATHETERIZATION  01/02/2016   Procedure: Lower Extremity Intervention;  Surgeon: Celso College, MD;  Location: [facility] INVASIVE CV LAB;  Service: Cardiovascular;;   THROMBECTOMY FEMORAL ARTERY Left 04/26/2015   Procedure: THROMBECTOMY FEMORAL ARTERY;  Surgeon: Celso College, MD;  Location: [facility] ORS;  Service: Vascular;  Laterality: Left;   VENTRAL HERNIA REPAIR N/A 07/25/2023   Procedure: HERNIA REPAIR VENTRAL ADULT, open;  Surgeon: Emmalene Hare, MD;  Location: [facility] ORS;  Service: General;  Laterality: N/A;    Medical History: Past Medical History:  Diagnosis Date   Adenocarcinoma of upper lobe of right lung (HCC)    a.) s/p posterior [facility]ectomy on 03/20/23 at [facility]   Anemia    Arthritis    Asthma    Carotid artery stenosis    a.) doppler 05/14/2017: 1-39% BICA; b.) doppler 04/05/2018: 50-69% BICA; c.) doppler 06/12/2019: 40-59% BICA; d.) doppler 06/14/2020, 06/14/2021: 1-39% BICA; e.) doppler 06/19/2022: 1.39% RICA, 40-59% LICA   Colon polyp    COPD (chronic obstructive pulmonary disease) (HCC)    Coronary artery disease 04/03/2006   a.) LHC 04/03/2006: 25% mLM, 25% mLAD, 50% D1, 25/50% mLCx, 50% dLCx, 25% OM2, 100% pRCA --> med mgmt; b.) LHC 04/25/2010: 25% pLM, 25% pLAD, 50% pLCx, 70% dLCx, 100% pRCA --> med mgmt   Depression    Diastolic dysfunction  a.) TTE 04/05/2018: EF 65%, no RWMAs, G1DD, triv MR/TR; b.) TTE 10/15/2022: EF 88%, mod LVH, G1DD, triv TR, mild MR; c.) TTE 02/27/2023: EF 65%, no RWMAs, triv TR/MR, RVSP 35; d.) TTE 03/21/2023: EF 65%, no RWMAs, norm RVSF, PASP 44   Environmental allergies    Headache    Hyperlipidemia    Hypertension    Hypoxic respiratory failure (HCC)    Internal hemorrhoids    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[facility])    a.) thinks it was in 2007 --> LHC 04/03/2006 revealed a 100% pRCA --> medically managed   Necrotizing pneumonia (HCC)    Pulmonary HTN (HCC)    a.) TTE 02/27/2023: RVSP 35 mmHg; b.) TTE 03/21/2023: PASP 44 mmHg   PVD (peripheral vascular disease) with claudication (HCC)    Shortness of breath dyspnea    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    Substance abuse (HCC)    Substance induced mood disorder (HCC)    Suicidal ideation    Thrombocytopenia (HCC)    Tobacco abuse    Unstable angina (HCC)    Ventral hernia    Vitamin D  deficiency     Family History: Family History  Problem Relation Age of Onset   Hypertension Mother    Hypertension Father      Social History   Socioeconomic History   Marital status: Single    Spouse name: Not on file   Number of children: Not on file   Years of education: Not on file   Highest education level: Not on file  Occupational History   Not on file  Tobacco Use   Smoking status: Every Day    Current packs/day: 1.00    Types: Cigarettes    Passive exposure: Past   Smokeless tobacco: Never  Vaping Use   Vaping status: Never Used  Substance and Sexual Activity   Alcohol use: Yes    Alcohol/week: 0.0 standard drinks of alcohol    Comment: weekends, couple beers, pint of liquer only on weekends.  Last drank approx 1 month ago.   Drug use: No   Sexual activity: Not Currently  Other Topics Concern   Not on file  Social History Narrative   Lives with family    Social Drivers of Health   Financial Resource Strain: Low Risk  (03/21/2023)   Received from [facility]   Overall Financial Resource Strain (CARDIA)    Difficulty of Paying Living Expenses: Not hard at all  Food Insecurity: No Food Insecurity (01/07/2024)   Received from [facility]   Hunger Vital Sign    Worried About Running Out of Food in the Last Year: Never true    Ran Out of Food in the Last Year: Never true  Transportation Needs: No Transportation Needs (01/07/2024)   Received from [facility] - Transportation    Lack of Transportation (Medical): No    Lack of Transportation (Non-Medical): No  Physical Activity: Inactive (04/04/2018)   Exercise Vital Sign    Days of Exercise per Week: 0 days    Minutes of Exercise per Session: 0 min  Stress: No Stress Concern Present (04/04/2018)   [employer] of Occupational Health - Occupational Stress Questionnaire    Feeling of Stress : Only a little  Social Connections: Moderately Isolated (04/04/2018)   Social Connection and Isolation Panel [NHANES]    Frequency of Communication with Friends and Family: Once a week    Frequency of Social Gatherings with  Friends and Family: Once a week    Attends Religious Services: 1 to 4 times per year    Active Member of [employer] or Organizations: No    Attends Banker Meetings: Never    Marital Status: Never married  Intimate Partner Violence: Not At Risk (06/12/2023)   Humiliation, Afraid, Rape, and Kick questionnaire    Fear of Current or Ex-Partner: No    Emotionally Abused: No    Physically Abused: No    Sexually Abused: No      Review of Systems  Constitutional:  Positive for appetite change. Negative for chills, fatigue and unexpected weight change.  HENT:  Negative for congestion, rhinorrhea, sneezing and sore throat.   Eyes:  Negative for redness.  Respiratory: Negative.  Negative for cough, chest tightness, shortness of breath and wheezing.   Cardiovascular: Negative.  Negative for chest pain and palpitations.  Gastrointestinal:  Negative for abdominal pain, constipation, diarrhea, nausea and vomiting.  Genitourinary:  Negative for dysuria and frequency.  Musculoskeletal:  Negative for arthralgias, back pain, joint swelling and neck pain.  Skin:  Negative for rash.  Neurological: Negative.  Negative for tremors and numbness.  Hematological:  Negative for adenopathy. Does not bruise/bleed easily.  Psychiatric/Behavioral:  Positive for sleep disturbance. Negative for behavioral problems (Depression), self-injury and suicidal ideas. The patient is not nervous/anxious.     Vital Signs: BP (!) 120/90   Pulse 80   Temp 97.9 F (36.6 C)   Resp 16   Ht 5\' 4"  (1.626 m)   Wt 104 lb 12.8 oz (47.5 kg)   LMP  (LMP Unknown)   SpO2 95%   BMI 17.99 kg/m    Physical Exam Vitals reviewed.  Constitutional:      Appearance: Normal appearance. She is normal weight.  HENT:     Head: Normocephalic and atraumatic.  Eyes:     Pupils: Pupils are equal, round, and reactive to light.  Cardiovascular:     Rate and Rhythm: Normal rate and regular rhythm.  Pulmonary:     Effort:  Pulmonary effort is normal. No respiratory distress.  Neurological:     Mental Status: She is alert and oriented to person, place, and time.  Psychiatric:        Mood and Affect: Mood normal.        Behavior: Behavior normal.       Assessment/Plan: 1. Essential hypertension (Primary) Stable, Continue losartan  and metoprolol  as prescribed.   2. Coronary artery disease due to lipid rich plaque Continue atorvastatin  and zetia  as prescribed.  - atorvastatin  (LIPITOR ) 80 MG tablet; Take 1 tablet (80 mg total) by mouth daily.  Dispense: 90 tablet; Refill: 3 - ezetimibe  (ZETIA ) 10 MG tablet; Take 1 tablet (10 mg total) by mouth daily.  Dispense: 90 tablet; Refill: 3  3. Mixed hyperlipidemia Continue atorvastatin  and zetia  as prescribed.   4. Right sided abdominal pain Continue oxycodone  and cyclobenzaprine  as prescribed.  - cyclobenzaprine  (FLEXERIL ) 5 MG tablet; Take 1 tablet (5 mg total) by mouth at bedtime as needed for muscle spasms.  Dispense: 30 tablet; Refill: 3 - oxyCODONE  (ROXICODONE ) 5 MG immediate release tablet; Take 1 tablet (5 mg total) by mouth every 4 (four) hours as needed.  Dispense: 30 tablet; Refill: 0  5. Encounter for medication review Medication list reviewed, updated and refills ordered  - losartan  (COZAAR ) 50 MG tablet; Take 2 tablets (100 mg total) by mouth daily.  Dispense: 180 tablet; Refill: 3  6. Alcohol use  disorder Reports her alcohol use has decreased considerably, and she mostly only drinks on the weekends now instead of every day.   7. Tobacco use disorder, continuous Not currently interested in smoking cessation. Reports that the patches did not help and declined any additional help such as chantix or bupropion.    General Counseling: Charleene verbalizes understanding of the findings of todays visit and agrees with plan of treatment. I have discussed any further diagnostic evaluation that may be needed or ordered today. We also reviewed her  medications today. she has been encouraged to call the office with any questions or concerns that should arise related to todays visit.    No orders of the defined types were placed in this encounter.   Meds ordered this encounter  Medications   atorvastatin  (LIPITOR ) 80 MG tablet    Sig: Take 1 tablet (80 mg total) by mouth daily.    Dispense:  90 tablet    Refill:  3   cyclobenzaprine  (FLEXERIL ) 5 MG tablet    Sig: Take 1 tablet (5 mg total) by mouth at bedtime as needed for muscle spasms.    Dispense:  30 tablet    Refill:  3    For future refills   ezetimibe  (ZETIA ) 10 MG tablet    Sig: Take 1 tablet (10 mg total) by mouth daily.    Dispense:  90 tablet    Refill:  3   losartan  (COZAAR ) 50 MG tablet    Sig: Take 2 tablets (100 mg total) by mouth daily.    Dispense:  180 tablet    Refill:  3   oxyCODONE  (ROXICODONE ) 5 MG immediate release tablet    Sig: Take 1 tablet (5 mg total) by mouth every 4 (four) hours as needed.    Dispense:  30 tablet    Refill:  0    Fill new script today    Return in about 3 months (around 04/14/2024) for F/U, Brahm Barbeau PCP, order labs for november. .   Total time spent:30 Minutes Time spent includes review of chart, medications, test results, and follow up plan with the patient.   [place] Controlled Substance Database was reviewed by me.  This patient was seen by Laurence Pons, FNP-C in collaboration with Dr. Verneta Gone as a part of collaborative care agreement.   Layna Roeper R. Bobbi Burow, MSN, FNP-C Internal medicine

## 2024-01-17 ENCOUNTER — Encounter: Payer: Self-pay | Admitting: Cardiovascular Disease

## 2024-01-17 ENCOUNTER — Ambulatory Visit (INDEPENDENT_AMBULATORY_CARE_PROVIDER_SITE_OTHER): Admitting: Cardiovascular Disease

## 2024-01-17 VITALS — BP 136/72 | HR 94 | Ht 64.0 in | Wt 102.6 lb

## 2024-01-17 DIAGNOSIS — I2583 Coronary atherosclerosis due to lipid rich plaque: Secondary | ICD-10-CM

## 2024-01-17 DIAGNOSIS — I70211 Atherosclerosis of native arteries of extremities with intermittent claudication, right leg: Secondary | ICD-10-CM | POA: Diagnosis not present

## 2024-01-17 DIAGNOSIS — Z8639 Personal history of other endocrine, nutritional and metabolic disease: Secondary | ICD-10-CM

## 2024-01-17 DIAGNOSIS — F17219 Nicotine dependence, cigarettes, with unspecified nicotine-induced disorders: Secondary | ICD-10-CM | POA: Diagnosis not present

## 2024-01-17 DIAGNOSIS — I10 Essential (primary) hypertension: Secondary | ICD-10-CM

## 2024-01-17 DIAGNOSIS — I2510 Atherosclerotic heart disease of native coronary artery without angina pectoris: Secondary | ICD-10-CM | POA: Diagnosis not present

## 2024-01-17 NOTE — Progress Notes (Signed)
 Cardiology Office Note   Date:  01/17/2024   ID:  [name], DOB 1956-06-15, MRN [id number]  PCP:  Laurence Pons, NP  Cardiologist:  Debborah Fairly, MD      History of Present Illness: [name] is a 68 y.o. female who presents for  Chief Complaint  Patient presents with   Follow-up    3 month follow up    Doing well, no SOB      Past Medical History:  Diagnosis Date   Adenocarcinoma of upper lobe of right lung (HCC)    a.) s/p posterior segmentectomy on 03/20/23 at [facility]   Anemia    Arthritis    Asthma    Carotid artery stenosis    a.) doppler 05/14/2017: 1-39% BICA; b.) doppler 04/05/2018: 50-69% BICA; c.) doppler 06/12/2019: 40-59% BICA; d.) doppler 06/14/2020, 06/14/2021: 1-39% BICA; e.) doppler 06/19/2022: 1.39% RICA, 40-59% LICA   Colon polyp    COPD (chronic obstructive pulmonary disease) (HCC)    Coronary artery disease 04/03/2006   a.) LHC 04/03/2006: 25% mLM, 25% mLAD, 50% D1, 25/50% mLCx, 50% dLCx, 25% OM2, 100% pRCA --> med mgmt; b.) LHC 04/25/2010: 25% pLM, 25% pLAD, 50% pLCx, 70% dLCx, 100% pRCA --> med mgmt   Depression    Diastolic dysfunction    a.) TTE 04/05/2018: EF 65%, no RWMAs, G1DD, triv MR/TR; b.) TTE 10/15/2022: EF 88%, mod LVH, G1DD, triv TR, mild MR; c.) TTE 02/27/2023: EF 65%, no RWMAs, triv TR/MR, RVSP 35; d.) TTE 03/21/2023: EF 65%, no RWMAs, norm RVSF, PASP 44   Environmental allergies    Headache    Hyperlipidemia    Hypertension    Hypoxic respiratory failure (HCC)    Internal hemorrhoids    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[place])    a.) thinks it was in 2007 --> LHC 04/03/2006 revealed a 100% pRCA --> medically managed   Necrotizing pneumonia (HCC)    Pulmonary HTN (HCC)    a.) TTE 02/27/2023: RVSP 35 mmHg; b.) TTE 03/21/2023: PASP 44 mmHg   PVD (peripheral vascular disease) with claudication (HCC)    Shortness of breath dyspnea    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    Substance abuse (HCC)    Substance induced  mood disorder (HCC)    Suicidal ideation    Thrombocytopenia (HCC)    Tobacco abuse    Unstable angina (HCC)    Ventral hernia    Vitamin D  deficiency      Past Surgical History:  Procedure Laterality Date   ABDOMINAL HYSTERECTOMY     APPENDECTOMY     CARDIAC CATHETERIZATION     CARDIAC SURGERY     CHOLECYSTECTOMY     COLONOSCOPY  12/15/2011   OH->bleeding internal hemorrhoids, otherwise normal   COLONOSCOPY WITH PROPOFOL  N/A 04/12/2015   Procedure: COLONOSCOPY WITH PROPOFOL ;  Surgeon: Marnee Sink, MD;  Location: [facility] ENDOSCOPY;  Service: Endoscopy;  Laterality: N/A;   COLONOSCOPY WITH PROPOFOL  N/A 01/05/2020   Procedure: COLONOSCOPY WITH PROPOFOL ;  Surgeon: Marnee Sink, MD;  Location: [facility] ENDOSCOPY;  Service: Endoscopy;  Laterality: N/A;   ENDARTERECTOMY FEMORAL Left 04/26/2015   Procedure: ENDARTERECTOMY FEMORAL;  Surgeon: Celso College, MD;  Location: [facility] ORS;  Service: Vascular;  Laterality: Left;   ENDOVASCULAR STENT INSERTION Bilateral    Legs   FLEXIBLE BRONCHOSCOPY N/A 02/02/2015   Procedure: FLEXIBLE BRONCHOSCOPY;  Surgeon: Cordie Deters, MD;  Location: [facility] ORS;  Service: Pulmonary;  Laterality: N/A;   HEMORRHOID SURGERY  lung mass removed     PERIPHERAL VASCULAR CATHETERIZATION Left 04/25/2015   Procedure: Lower Extremity Angiography;  Surgeon: Celso College, MD;  Location: [facility] INVASIVE CV LAB;  Service: Cardiovascular;  Laterality: Left;   PERIPHERAL VASCULAR CATHETERIZATION Left 04/25/2015   Procedure: Lower Extremity Intervention;  Surgeon: Celso College, MD;  Location: [facility];  Service: Cardiovascular;  Laterality: Left;   PERIPHERAL VASCULAR CATHETERIZATION Left 01/02/2016   Procedure: Lower Extremity Angiography;  Surgeon: Celso College, MD;  Location: [facility];  Service: Cardiovascular;  Laterality: Left;   PERIPHERAL VASCULAR CATHETERIZATION  01/02/2016   Procedure: Lower Extremity Intervention;  Surgeon: Celso College, MD;  Location:  [facility];  Service: Cardiovascular;;   THROMBECTOMY FEMORAL ARTERY Left 04/26/2015   Procedure: THROMBECTOMY FEMORAL ARTERY;  Surgeon: Celso College, MD;  Location: [facility] ORS;  Service: Vascular;  Laterality: Left;   VENTRAL HERNIA REPAIR N/A 07/25/2023   Procedure: HERNIA REPAIR VENTRAL ADULT, open;  Surgeon: Emmalene Hare, MD;  Location: [facility] ORS;  Service: General;  Laterality: N/A;     Current Outpatient Medications  Medication Sig Dispense Refill   albuterol  (VENTOLIN  HFA) 108 (90 Base) MCG/ACT inhaler Inhale 2 puffs into the lungs every 4 (four) hours as needed for wheezing or shortness of breath. 9 g 1   aspirin  EC 81 MG EC tablet Take 1 tablet (81 mg total) by mouth daily. 90 tablet 1   atorvastatin  (LIPITOR ) 80 MG tablet Take 1 tablet (80 mg total) by mouth daily. 90 tablet 3   budesonide -formoterol  (SYMBICORT ) 80-4.5 MCG/ACT inhaler INHALE 2 PUFFS ONCE DAILY 22 g 2   Cholecalciferol  (VITAMIN D -3 PO) Take 1 tablet by mouth daily.     cyclobenzaprine  (FLEXERIL ) 5 MG tablet Take 1 tablet (5 mg total) by mouth at bedtime as needed for muscle spasms. 30 tablet 3   ezetimibe  (ZETIA ) 10 MG tablet Take 1 tablet (10 mg total) by mouth daily. 90 tablet 3   ferrous sulfate  325 (65 FE) MG tablet Take 325 mg by mouth daily.     gabapentin  (NEURONTIN ) 100 MG capsule Take 1 capsule (100 mg total) by mouth 3 (three) times daily. 90 capsule 0   losartan  (COZAAR ) 50 MG tablet Take 2 tablets (100 mg total) by mouth daily. 180 tablet 3   metoprolol  succinate (TOPROL -XL) 50 MG 24 hr tablet TAKE 1 TABLET BY MOUTH ONCE DAILY WITH  OR  IMMEDIATELY  FOLLOWING  A  MEAL 90 tablet 0   oxyCODONE  (ROXICODONE ) 5 MG immediate release tablet Take 1 tablet (5 mg total) by mouth every 4 (four) hours as needed. 30 tablet 0   thiamine  (VITAMIN B-1) 100 MG tablet Take 1 tablet (100 mg total) by mouth daily. 15 tablet 0   acetaminophen  (TYLENOL ) 500 MG tablet Take 2 tablets (1,000 mg total) by mouth every 6 (six)  hours as needed for mild pain (pain score 1-3). (Patient not taking: Reported on 01/17/2024)     nicotine  (NICODERM CQ  - DOSED IN MG/24 HOURS) 14 mg/24hr patch Place 1 patch (14 mg total) onto the skin daily. (Patient not taking: Reported on 01/17/2024) 28 patch 0   No current facility-administered medications for this visit.    Allergies:   Aspirin , Zyrtec [cetirizine], and Tramadol    Social History:   reports that she has been smoking cigarettes. She has been exposed to tobacco smoke. She has never used smokeless tobacco. She reports current alcohol use. She reports that she  does not use drugs.   Family History:  family history includes Hypertension in her father and mother.    ROS:     Review of Systems  Constitutional: Negative.   HENT: Negative.    Eyes: Negative.   Respiratory: Negative.    Gastrointestinal: Negative.   Genitourinary: Negative.   Musculoskeletal: Negative.   Skin: Negative.   Neurological: Negative.   Endo/Heme/Allergies: Negative.   Psychiatric/Behavioral: Negative.    All other systems reviewed and are negative.     All other systems are reviewed and negative.    PHYSICAL EXAM: VS:  BP 136/72   Pulse 94   Ht 5\' 4"  (1.626 m)   Wt 102 lb 9.6 oz (46.5 kg)   LMP  (LMP Unknown)   SpO2 95%   BMI 17.61 kg/m  , BMI Body mass index is 17.61 kg/m. Last weight:  Wt Readings from Last 3 Encounters:  01/17/24 102 lb 9.6 oz (46.5 kg)  01/13/24 104 lb 12.8 oz (47.5 kg)  12/23/23 103 lb (46.7 kg)     Physical Exam Constitutional:      Appearance: Normal appearance.  Cardiovascular:     Rate and Rhythm: Normal rate and regular rhythm.     Heart sounds: Normal heart sounds.  Pulmonary:     Effort: Pulmonary effort is normal.     Breath sounds: Normal breath sounds.  Musculoskeletal:     Right lower leg: No edema.     Left lower leg: No edema.  Neurological:     Mental Status: She is alert.       EKG:   Recent Labs: 06/11/2023: ALT 27; BUN  16; Creatinine, Ser 0.90; Hemoglobin 12.3; Platelets 201; Potassium 3.3; Sodium 140    Lipid Panel    Component Value Date/Time   CHOL 202 (H) 06/18/2023 0707   CHOL 149 05/28/2022 1103   CHOL 147 10/16/2013 0439   TRIG 186 (H) 06/18/2023 0707   TRIG 122 10/16/2013 0439   HDL 61 06/18/2023 0707   HDL 79 05/28/2022 1103   HDL 31 (L) 10/16/2013 0439   CHOLHDL 3.3 06/18/2023 0707   VLDL 37 06/18/2023 0707   VLDL 24 10/16/2013 0439   LDLCALC 104 (H) 06/18/2023 0707   LDLCALC 55 05/28/2022 1103   LDLCALC 92 10/16/2013 0439      Other studies Reviewed: Additional studies/ records that were reviewed today include:  Review of the above records demonstrates:       No data to display            ASSESSMENT AND PLAN:    ICD-10-CM   1. Coronary artery disease due to lipid rich plaque  I25.10    I25.83    No chest pain or SOB    2. H/O hypercholesterolemia  Z86.39     3. Atherosclerosis of native artery of right lower extremity with intermittent claudication (HCC)  I70.211     4. Essential hypertension  I10     5. Cigarette nicotine  dependence with nicotine -induced disorder  F17.219        Problem List Items Addressed This Visit       Cardiovascular and Mediastinum   Essential hypertension   Atherosclerosis of native arteries of extremity with intermittent claudication (HCC)   Coronary artery disease due to lipid rich plaque - Primary     Nervous and Auditory   Cigarette nicotine  dependence with nicotine -induced disorder     Other   H/O hypercholesterolemia  Disposition:   Return in about 3 months (around 04/18/2024).    Total time spent: 30 minutes  Signed,  Debborah Fairly, MD  01/17/2024 10:52 AM    [facility]

## 2024-01-20 ENCOUNTER — Encounter: Payer: Self-pay | Admitting: Nurse Practitioner

## 2024-01-21 ENCOUNTER — Encounter (INDEPENDENT_AMBULATORY_CARE_PROVIDER_SITE_OTHER): Payer: Self-pay

## 2024-01-22 ENCOUNTER — Emergency Department

## 2024-01-22 ENCOUNTER — Emergency Department
Admission: EM | Admit: 2024-01-22 | Discharge: 2024-01-23 | Disposition: A | Discharge status: Home / Self Care | Attending: Emergency Medicine | Admitting: Emergency Medicine

## 2024-01-22 ENCOUNTER — Other Ambulatory Visit: Payer: Self-pay

## 2024-01-22 DIAGNOSIS — R748 Abnormal levels of other serum enzymes: Secondary | ICD-10-CM | POA: Insufficient documentation

## 2024-01-22 DIAGNOSIS — Z85118 Personal history of other malignant neoplasm of bronchus and lung: Secondary | ICD-10-CM | POA: Insufficient documentation

## 2024-01-22 DIAGNOSIS — N179 Acute kidney failure, unspecified: Secondary | ICD-10-CM | POA: Diagnosis not present

## 2024-01-22 DIAGNOSIS — I110 Hypertensive heart disease with heart failure: Secondary | ICD-10-CM | POA: Diagnosis not present

## 2024-01-22 DIAGNOSIS — Z7982 Long term (current) use of aspirin: Secondary | ICD-10-CM | POA: Diagnosis not present

## 2024-01-22 DIAGNOSIS — R109 Unspecified abdominal pain: Secondary | ICD-10-CM | POA: Diagnosis not present

## 2024-01-22 DIAGNOSIS — R112 Nausea with vomiting, unspecified: Secondary | ICD-10-CM

## 2024-01-22 DIAGNOSIS — I509 Heart failure, unspecified: Secondary | ICD-10-CM | POA: Diagnosis not present

## 2024-01-22 DIAGNOSIS — R1084 Generalized abdominal pain: Secondary | ICD-10-CM

## 2024-01-22 DIAGNOSIS — J4489 Other specified chronic obstructive pulmonary disease: Secondary | ICD-10-CM | POA: Diagnosis not present

## 2024-01-22 DIAGNOSIS — Z9049 Acquired absence of other specified parts of digestive tract: Secondary | ICD-10-CM | POA: Diagnosis not present

## 2024-01-22 DIAGNOSIS — F17200 Nicotine dependence, unspecified, uncomplicated: Secondary | ICD-10-CM | POA: Diagnosis not present

## 2024-01-22 DIAGNOSIS — I2510 Atherosclerotic heart disease of native coronary artery without angina pectoris: Secondary | ICD-10-CM | POA: Diagnosis not present

## 2024-01-22 DIAGNOSIS — Z79899 Other long term (current) drug therapy: Secondary | ICD-10-CM | POA: Diagnosis not present

## 2024-01-22 DIAGNOSIS — R197 Diarrhea, unspecified: Secondary | ICD-10-CM | POA: Insufficient documentation

## 2024-01-22 LAB — CBC
HCT: 36.1 % (ref 36.0–46.0)
Hemoglobin: 11.9 g/dL — ABNORMAL LOW (ref 12.0–15.0)
MCH: 34.8 pg — ABNORMAL HIGH (ref 26.0–34.0)
MCHC: 33 g/dL (ref 30.0–36.0)
MCV: 105.6 fL — ABNORMAL HIGH (ref 80.0–100.0)
Platelets: 130 10*3/uL — ABNORMAL LOW (ref 150–400)
RBC: 3.42 MIL/uL — ABNORMAL LOW (ref 3.87–5.11)
RDW: 13.4 % (ref 11.5–15.5)
WBC: 5 10*3/uL (ref 4.0–10.5)
nRBC: 0 % (ref 0.0–0.2)

## 2024-01-22 LAB — COMPREHENSIVE METABOLIC PANEL WITH GFR
ALT: 19 U/L (ref 0–44)
AST: 30 U/L (ref 15–41)
Albumin: 3.7 g/dL (ref 3.5–5.0)
Alkaline Phosphatase: 50 U/L (ref 38–126)
Anion gap: 10 (ref 5–15)
BUN: 20 mg/dL (ref 8–23)
CO2: 20 mmol/L — ABNORMAL LOW (ref 22–32)
Calcium: 9.1 mg/dL (ref 8.9–10.3)
Chloride: 110 mmol/L (ref 98–111)
Creatinine, Ser: 1.56 mg/dL — ABNORMAL HIGH (ref 0.44–1.00)
GFR, Estimated: 36 mL/min — ABNORMAL LOW (ref >60)
Glucose, Bld: 88 mg/dL (ref 70–99)
Potassium: 3.9 mmol/L (ref 3.5–5.1)
Sodium: 140 mmol/L (ref 135–145)
Total Bilirubin: 0.7 mg/dL (ref 0.0–1.2)
Total Protein: 6.9 g/dL (ref 6.5–8.1)

## 2024-01-22 LAB — LIPASE, BLOOD: Lipase: 58 U/L — ABNORMAL HIGH (ref 11–51)

## 2024-01-22 MED ORDER — ONDANSETRON HCL 4 MG/2ML IJ SOLN
4.0000 mg | Freq: Once | INTRAMUSCULAR | Status: AC
  Administered 2024-01-23: 4 mg via INTRAVENOUS
  Filled 2024-01-22: qty 2

## 2024-01-22 MED ORDER — DICYCLOMINE HCL 10 MG PO CAPS
20.0000 mg | ORAL_CAPSULE | Freq: Once | ORAL | Status: AC
Start: 2024-01-23 — End: 2024-01-23
  Administered 2024-01-23: 20 mg via ORAL
  Filled 2024-01-22: qty 2

## 2024-01-22 MED ORDER — SODIUM CHLORIDE 0.9 % IV BOLUS (SEPSIS)
1000.0000 mL | Freq: Once | INTRAVENOUS | Status: AC
Start: 2024-01-23 — End: 2024-01-23
  Administered 2024-01-23: 1000 mL via INTRAVENOUS

## 2024-01-22 NOTE — ED Provider Notes (Addendum)
 [facility] Provider Note    Event Date/Time   First MD Initiated Contact with Patient 01/22/24 2323     (approximate)   History   Abdominal Pain   HPI  [name] is a 68 y.o. female with history of lung cancer, COPD, CAD, CHF, hypertension, hyperlipidemia, substance use disorder who presents to the emergency department with complaints of generalized abdominal pain, vomiting and diarrhea for the past 6 days.  Patient has had previous cholecystectomy, hysterectomy, appendectomy.  She denies dysuria, hematuria, vaginal bleeding or discharge.  No recent travel.  No sick contacts.   History provided by patient.    Past Medical History:  Diagnosis Date   Adenocarcinoma of upper lobe of right lung (HCC)    a.) s/p posterior segmentectomy on 03/20/23 at [facility]   Anemia    Arthritis    Asthma    Carotid artery stenosis    a.) doppler 05/14/2017: 1-39% BICA; b.) doppler 04/05/2018: 50-69% BICA; c.) doppler 06/12/2019: 40-59% BICA; d.) doppler 06/14/2020, 06/14/2021: 1-39% BICA; e.) doppler 06/19/2022: 1.39% RICA, 40-59% LICA   Colon polyp    COPD (chronic obstructive pulmonary disease) (HCC)    Coronary artery disease 04/03/2006   a.) LHC 04/03/2006: 25% mLM, 25% mLAD, 50% D1, 25/50% mLCx, 50% dLCx, 25% OM2, 100% pRCA --> med mgmt; b.) LHC 04/25/2010: 25% pLM, 25% pLAD, 50% pLCx, 70% dLCx, 100% pRCA --> med mgmt   Depression    Diastolic dysfunction    a.) TTE 04/05/2018: EF 65%, no RWMAs, G1DD, triv MR/TR; b.) TTE 10/15/2022: EF 88%, mod LVH, G1DD, triv TR, mild MR; c.) TTE 02/27/2023: EF 65%, no RWMAs, triv TR/MR, RVSP 35; d.) TTE 03/21/2023: EF 65%, no RWMAs, norm RVSF, PASP 44   Environmental allergies    Headache    Hyperlipidemia    Hypertension    Hypoxic respiratory failure (HCC)    Internal hemorrhoids    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[facility])    a.) thinks it was in 2007 --> LHC 04/03/2006 revealed a 100% pRCA --> medically managed   Necrotizing pneumonia  (HCC)    Pulmonary HTN (HCC)    a.) TTE 02/27/2023: RVSP 35 mmHg; b.) TTE 03/21/2023: PASP 44 mmHg   PVD (peripheral vascular disease) with claudication (HCC)    Shortness of breath dyspnea    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    Substance abuse (HCC)    Substance induced mood disorder (HCC)    Suicidal ideation    Thrombocytopenia (HCC)    Tobacco abuse    Unstable angina (HCC)    Ventral hernia    Vitamin D  deficiency     Past Surgical History:  Procedure Laterality Date   ABDOMINAL HYSTERECTOMY     APPENDECTOMY     CARDIAC CATHETERIZATION     CARDIAC SURGERY     CHOLECYSTECTOMY     COLONOSCOPY  12/15/2011   [place]->bleeding internal hemorrhoids, otherwise normal   COLONOSCOPY WITH PROPOFOL  N/A 04/12/2015   Procedure: COLONOSCOPY WITH PROPOFOL ;  Surgeon: Marnee Sink, MD;  Location: [facility] ENDOSCOPY;  Service: Endoscopy;  Laterality: N/A;   COLONOSCOPY WITH PROPOFOL  N/A 01/05/2020   Procedure: COLONOSCOPY WITH PROPOFOL ;  Surgeon: Marnee Sink, MD;  Location: [facility] ENDOSCOPY;  Service: Endoscopy;  Laterality: N/A;   ENDARTERECTOMY FEMORAL Left 04/26/2015   Procedure: ENDARTERECTOMY FEMORAL;  Surgeon: Celso College, MD;  Location: [facility] ORS;  Service: Vascular;  Laterality: Left;   ENDOVASCULAR STENT INSERTION Bilateral    Legs   FLEXIBLE BRONCHOSCOPY  N/A 02/02/2015   Procedure: FLEXIBLE BRONCHOSCOPY;  Surgeon: Cordie Deters, MD;  Location: [facility] ORS;  Service: Pulmonary;  Laterality: N/A;   HEMORRHOID SURGERY     lung mass removed     PERIPHERAL VASCULAR CATHETERIZATION Left 04/25/2015   Procedure: Lower Extremity Angiography;  Surgeon: Celso College, MD;  Location: [facility] INV[facility]IVE CV [facility];  Service: Cardiovascular;  Laterality: Left;   PERIPHERAL VASCULAR CATHETERIZATION Left 04/25/2015   Procedure: Lower Extremity Intervention;  Surgeon: Celso College, MD;  Location: [facility] CV [facility];  Service: Cardiovascular;  Laterality: Left;   PERIPHERAL VASCULAR CATHETERIZATION  Left 01/02/2016   Procedure: Lower Extremity Angiography;  Surgeon: Celso College, MD;  Location: [facility] INV[facility] CV LAB;  Service: Cardiovascular;  Laterality: Left;   PERIPHERAL VASCULAR CATHETERIZATION  01/02/2016   Procedure: Lower Extremity Intervention;  Surgeon: Celso College, MD;  Location: [facility] CV LAB;  Service: Cardiovascular;;   THROMBECTOMY FEMORAL ARTERY Left 04/26/2015   Procedure: THROMBECTOMY FEMORAL ARTERY;  Surgeon: Celso College, MD;  Location: [facility] ORS;  Service: Vascular;  Laterality: Left;   VENTRAL HERNIA REPAIR N/A 07/25/2023   Procedure: HERNIA REPAIR VENTRAL ADULT, open;  Surgeon: Emmalene Hare, MD;  Location: [facility] ORS;  Service: General;  Laterality: N/A;    MEDICATIONS:  Prior to Admission medications   Medication Sig Start Date End Date Taking? Authorizing Provider  acetaminophen  (TYLENOL ) 500 MG tablet Take 2 tablets (1,000 mg total) by mouth every 6 (six) hours as needed for mild pain (pain score 1-3). Patient not taking: Reported on 01/17/2024 07/25/23   Emmalene Hare, MD  albuterol  (VENTOLIN  HFA) 108 (90 Base) MCG/ACT inhaler Inhale 2 puffs into the lungs every 4 (four) hours as needed for wheezing or shortness of breath. 12/23/23   McDonough, Lillie Reining, PA-C  aspirin  EC 81 MG EC tablet Take 1 tablet (81 mg total) by mouth daily. 04/29/15   Celso College, MD  atorvastatin  (LIPITOR ) 80 MG tablet Take 1 tablet (80 mg total) by mouth daily. 01/13/24   Laurence Pons, NP  budesonide -formoterol  (SYMBICORT ) 80-4.5 MCG/ACT inhaler INHALE 2 PUFFS ONCE DAILY 12/23/23   McDonough, Lauren K, PA-C  Cholecalciferol  (VITAMIN D -3 PO) Take 1 tablet by mouth daily.    [provider]  cyclobenzaprine  (FLEXERIL ) 5 MG tablet Take 1 tablet (5 mg total) by mouth at bedtime as needed for muscle spasms. 01/13/24   Laurence Pons, NP  ezetimibe  (ZETIA ) 10 MG tablet Take 1 tablet (10 mg total) by mouth daily. 01/13/24   Laurence Pons, NP  ferrous sulfate  325 (65 FE) MG tablet  Take 325 mg by mouth daily.    [provider]  gabapentin  (NEURONTIN ) 100 MG capsule Take 1 capsule (100 mg total) by mouth 3 (three) times daily. 06/20/23   Silas Drivers, MD  losartan  (COZAAR ) 50 MG tablet Take 2 tablets (100 mg total) by mouth daily. 01/13/24   Abernathy, Alyssa, NP  metoprolol  succinate (TOPROL -XL) 50 MG 24 hr tablet TAKE 1 TABLET BY MOUTH ONCE DAILY WITH  OR  IMMEDIATELY  FOLLOWING  A  MEAL 01/18/23   Cherrie Cornwall, MD  nicotine  (NICODERM CQ  - DOSED IN MG/24 HOURS) 14 mg/24hr patch Place 1 patch (14 mg total) onto the skin daily. Patient not taking: Reported on 01/17/2024 06/21/23   Silas Drivers, MD  oxyCODONE  (ROXICODONE ) 5 MG immediate release tablet Take 1 tablet (5 mg total) by mouth every 4 (four) hours as needed. 01/13/24 01/12/25  Laurence Pons, NP  thiamine  (  VITAMIN B-1) 100 MG tablet Take 1 tablet (100 mg total) by mouth daily. 06/21/23   Silas Drivers, MD    Physical Exam   Triage Vital Signs: ED Triage Vitals  Encounter Vitals Group     BP 01/22/24 2114 125/74     Systolic BP Percentile --      Diastolic BP Percentile --      Pulse Rate 01/22/24 2114 88     Resp 01/22/24 2114 18     Temp 01/22/24 2114 98.8 F (37.1 C)     Temp src --      SpO2 01/22/24 2111 100 %     Weight 01/22/24 2114 102 lb 8.2 oz (46.5 kg)     Height 01/22/24 2114 5\' 4"  (1.626 m)     Head Circumference --      Peak Flow --      Pain Score 01/22/24 2113 10     Pain Loc --      Pain Education --      Exclude from Growth Chart --     Most recent vital signs: Vitals:   01/22/24 2114 01/23/24 0301  BP: 125/74 122/68  Pulse: 88 79  Resp: 18 18  Temp: 98.8 F (37.1 C) 98.6 F (37 C)  SpO2: 100% 100%    CONSTITUTIONAL: Alert, responds appropriately to questions. Well-appearing; well-nourished HEAD: Normocephalic, atraumatic EYES: Conjunctivae clear, pupils appear equal, sclera nonicteric ENT: normal nose; moist mucous membranes NECK: Supple,  normal ROM CARD: RRR; S1 and S2 appreciated RESP: Normal chest excursion without splinting or tachypnea; breath sounds clear and equal bilaterally; no wheezes, no rhonchi, no rales, no hypoxia or respiratory distress, speaking full sentences ABD/GI: Non-distended; soft, generalized tenderness on exam without guarding or rebound BACK: The back appears normal EXT: Normal ROM in all joints; no deformity noted, no edema SKIN: Normal color for age and race; warm; no rash on exposed skin NEURO: Moves all extremities equally, normal speech PSYCH: The patient's mood and manner are appropriate.   ED Results / Procedures / Treatments   LABS: (all labs ordered are listed, but only abnormal results are displayed) Labs Reviewed  LIPASE, BLOOD - Abnormal; Notable for the following components:      Result Value   Lipase 58 (*)    All other components within normal limits  COMPREHENSIVE METABOLIC PANEL WITH GFR - Abnormal; Notable for the following components:   CO2 20 (*)    Creatinine, Ser 1.56 (*)    GFR, Estimated 36 (*)    All other components within normal limits  CBC - Abnormal; Notable for the following components:   RBC 3.42 (*)    Hemoglobin 11.9 (*)    MCV 105.6 (*)    MCH 34.8 (*)    Platelets 130 (*)    All other components within normal limits  URINALYSIS, ROUTINE W REFLEX MICROSCOPIC - Abnormal; Notable for the following components:   Color, Urine YELLOW (*)    APPearance HAZY (*)    Leukocytes,Ua MODERATE (*)    Bacteria, UA RARE (*)    All other components within normal limits  URINE CULTURE     EKG:  EKG Interpretation Date/Time:    Ventricular Rate:    PR Interval:    QRS Duration:    QT Interval:    QTC Calculation:   R Axis:      Text Interpretation:           RADIOLOGY: My personal review and interpretation of  imaging: CT shows no acute abnormality.  I have personally reviewed all radiology reports.   CT ABDOMEN PELVIS WO CONTRAST Result Date:  01/22/2024 CLINICAL DATA:  Abdomen pain EXAM: CT ABDOMEN AND PELVIS WITHOUT CONTRAST TECHNIQUE: Multidetector CT imaging of the abdomen and pelvis was performed following the standard protocol without IV contrast. RADIATION DOSE REDUCTION: This exam was performed according to the departmental dose-optimization program which includes automated exposure control, adjustment of the mA and/or kV according to patient size and/or use of iterative reconstruction technique. COMPARISON:  CT 05/05/2023, 12/31/2020 FINDINGS: Lower chest: Lung bases demonstrate emphysema. No acute airspace disease. Stable 2 mm left lower lobe subpleural pulmonary nodule on series 4, image 1. Minimal scarring at the right base. Hepatobiliary: Cholecystectomy. Enlarged common bile duct measuring up to 15 mm but stable dating back to 2022. Pancreas: No inflammatory change or ductal dilatation Spleen: Normal in size without focal abnormality. Adrenals/Urinary Tract: Stable adrenal glands. No hydronephrosis. Small stones versus intrarenal vascular calcification. Urinary bladder is unremarkable. Stomach/Bowel: Stomach nonenlarged. No dilated small bowel. No acute bowel wall thickening. Negative appendix. Vascular/Lymphatic: Aortic atherosclerosis. No enlarged abdominal or pelvic lymph nodes. Postsurgical changes in the bilateral groins medically related to prior vascular procedure Reproductive: Status post hysterectomy. No adnexal masses. Other: Negative for pelvic effusion or free air Musculoskeletal: No acute or suspicious osseous abnormality IMPRESSION: 1. No CT evidence for acute intra-abdominal or pelvic abnormality. 2. Status post cholecystectomy. Stable enlargement of the common bile duct presumably related to postsurgical change 3. Emphysema. 4. Aortic atherosclerosis. Aortic Atherosclerosis (ICD10-I70.0) and Emphysema (ICD10-J43.9). Electronically Signed   By: Esmeralda Hedge M.D.   On: 01/22/2024 23:36     PROCEDURES:  Critical Care  performed: No    Procedures    IMPRESSION / MDM / ASSESSMENT AND PLAN / ED COURSE  I reviewed the triage vital signs and the nursing notes.    Patient here with nausea, vomiting, diarrhea, abdominal pain.   DIFFERENTIAL DIAGNOSIS (includes but not limited to):   Viral gastroenteritis, colitis, diverticulitis, UTI, doubt bowel obstruction   Patient's presentation is most consistent with acute presentation with potential threat to life or bodily function.   PLAN: Workup initiated from triage.  Labs show no leukocytosis.  Creatinine minimally elevated at 1.56.  Will give IV fluids.  Normal LFTs.  Lipase minimally elevated at 58.  CT scan of the abdomen pelvis already obtained from triage and reviewed/interpreted by myself and the radiologist and shows no acute abnormality.  Suspect viral illness.  Will give Bentyl, Zofran , IV fluids.  Will p.o. challenge.   MEDICATIONS GIVEN IN ED: Medications  sodium chloride  0.9 % bolus 1,000 mL (0 mLs Intravenous Stopped 01/23/24 0147)  dicyclomine (BENTYL) capsule 20 mg (20 mg Oral Given 01/23/24 0049)  ondansetron  (ZOFRAN ) injection 4 mg (4 mg Intravenous Given 01/23/24 0048)  acetaminophen  (OFIRMEV ) IV 1,000 mg (0 mg Intravenous Stopped 01/23/24 0216)     ED COURSE: Patient reports feeling better but still having some pain.  Will give IV Tylenol .  Tolerating p.o. here.  I feel she is safe for discharge.  Recommended Tylenol  as needed, avoiding NSAIDs, recheck of her kidney function in 1 to 2 weeks with her PCP, bland diet for several days and increase fluid intake.  Will discharge with Zofran , Bentyl.  Urine shows some white blood cells and bacteria but also many squamous cells.  Could be contaminated sample.  She denies urinary symptoms at this time.  Will add on culture.  Will hold antibiotics  currently.  At this time, I do not feel there is any life-threatening condition present. I reviewed all nursing notes, vitals, pertinent previous  records.  All lab and urine results, EKGs, imaging ordered have been independently reviewed and interpreted by myself.  I reviewed all available radiology reports from any imaging ordered this visit.  Based on my assessment, I feel the patient is safe to be discharged home without further emergent workup and can continue workup as an outpatient as needed. Discussed all findings, treatment plan as well as usual and customary return precautions.  They verbalize understanding and are comfortable with this plan.  Outpatient follow-up has been provided as needed.  All questions have been answered.    CONSULTS:  none   OUTSIDE RECORDS REVIEWED: Reviewed last internal medicine note on 01/17/2024.       FINAL CLINICAL IMPRESSION(S) / ED DIAGNOSES   Final diagnoses:  Nausea vomiting and diarrhea  Generalized abdominal pain  AKI (acute kidney injury) (HCC)     Rx / DC Orders   ED Discharge Orders          Ordered    ondansetron  (ZOFRAN -ODT) 4 MG disintegrating tablet  Every 6 hours PRN        01/23/24 0134    dicyclomine (BENTYL) 20 MG tablet  Every 8 hours PRN        01/23/24 0134             Note:  This document was prepared using Dragon voice recognition software and may include unintentional dictation errors.   Brandolyn Shortridge, Clover Dao, DO 01/23/24 0138    Albin Duckett, Clover Dao, DO 01/23/24 [phone]

## 2024-01-22 NOTE — ED Triage Notes (Signed)
 Pt reports abd pain that began 7 days ago, pt reports n/v/d. Pt denies dysuria or other accompanying symptoms

## 2024-01-22 NOTE — ED Triage Notes (Signed)
 EMS brings pt in for c/o abd pain x wk; hx of same

## 2024-01-23 DIAGNOSIS — N179 Acute kidney failure, unspecified: Secondary | ICD-10-CM | POA: Diagnosis not present

## 2024-01-23 LAB — URINALYSIS, ROUTINE W REFLEX MICROSCOPIC
Bilirubin Urine: NEGATIVE
Glucose, UA: NEGATIVE mg/dL
Hgb urine dipstick: NEGATIVE
Ketones, ur: NEGATIVE mg/dL
Nitrite: NEGATIVE
Protein, ur: NEGATIVE mg/dL
Specific Gravity, Urine: 1.011 (ref 1.005–1.030)
pH: 5 (ref 5.0–8.0)

## 2024-01-23 MED ORDER — DICYCLOMINE HCL 20 MG PO TABS: 20.0000 mg | ORAL_TABLET | Freq: Three times a day (TID) | ORAL | 0 refills | Status: DC | PRN

## 2024-01-23 MED ORDER — ONDANSETRON 4 MG PO TBDP: 4.0000 mg | ORAL_TABLET | Freq: Four times a day (QID) | ORAL | 0 refills | Status: DC | PRN

## 2024-01-23 MED ORDER — ACETAMINOPHEN 10 MG/ML IV SOLN
1000.0000 mg | Freq: Once | INTRAVENOUS | Status: AC
  Administered 2024-01-23: 1000 mg via INTRAVENOUS
  Filled 2024-01-23: qty 100

## 2024-01-23 NOTE — Discharge Instructions (Addendum)
 You may take Tylenol  1000 mg every 6 hours as needed for fever and pain.  You may take over-the-counter Imodium as needed for diarrhea.  Your kidney function was slightly elevated today likely from dehydration from vomiting and diarrhea.  We have given you IV fluids.  I recommend you avoid aspirin , ibuprofen, Aleve, Goody powders at this time as this can worsen renal function.  Please increase your fluid intake.  I recommend follow-up with your PCP in 1 to 2 weeks to have your creatinine rechecked.  I recommend a bland diet for the next several days.  Your symptoms are likely due to a viral illness.  Your CT scan showed no acute abnormality today.

## 2024-01-24 LAB — URINE CULTURE: Culture: NO GROWTH

## 2024-01-25 DIAGNOSIS — R911 Solitary pulmonary nodule: Secondary | ICD-10-CM | POA: Diagnosis not present

## 2024-02-25 DIAGNOSIS — R911 Solitary pulmonary nodule: Secondary | ICD-10-CM | POA: Diagnosis not present

## 2024-03-28 ENCOUNTER — Emergency Department
Admission: EM | Admit: 2024-03-28 | Discharge: 2024-03-29 | Disposition: A | Discharge status: Home / Self Care | Attending: Emergency Medicine | Admitting: Emergency Medicine

## 2024-03-28 ENCOUNTER — Other Ambulatory Visit: Payer: Self-pay

## 2024-03-28 DIAGNOSIS — R197 Diarrhea, unspecified: Secondary | ICD-10-CM | POA: Insufficient documentation

## 2024-03-28 DIAGNOSIS — R1033 Periumbilical pain: Secondary | ICD-10-CM | POA: Diagnosis present

## 2024-03-28 DIAGNOSIS — J449 Chronic obstructive pulmonary disease, unspecified: Secondary | ICD-10-CM | POA: Diagnosis not present

## 2024-03-28 DIAGNOSIS — I2510 Atherosclerotic heart disease of native coronary artery without angina pectoris: Secondary | ICD-10-CM | POA: Diagnosis not present

## 2024-03-28 DIAGNOSIS — I110 Hypertensive heart disease with heart failure: Secondary | ICD-10-CM | POA: Insufficient documentation

## 2024-03-28 DIAGNOSIS — R1084 Generalized abdominal pain: Secondary | ICD-10-CM | POA: Insufficient documentation

## 2024-03-28 DIAGNOSIS — I509 Heart failure, unspecified: Secondary | ICD-10-CM | POA: Insufficient documentation

## 2024-03-28 DIAGNOSIS — R112 Nausea with vomiting, unspecified: Secondary | ICD-10-CM | POA: Insufficient documentation

## 2024-03-28 DIAGNOSIS — N2889 Other specified disorders of kidney and ureter: Secondary | ICD-10-CM | POA: Diagnosis not present

## 2024-03-28 DIAGNOSIS — Z85118 Personal history of other malignant neoplasm of bronchus and lung: Secondary | ICD-10-CM | POA: Insufficient documentation

## 2024-03-28 DIAGNOSIS — K551 Chronic vascular disorders of intestine: Secondary | ICD-10-CM | POA: Diagnosis not present

## 2024-03-28 DIAGNOSIS — R109 Unspecified abdominal pain: Secondary | ICD-10-CM | POA: Diagnosis not present

## 2024-03-28 DIAGNOSIS — K4090 Unilateral inguinal hernia, without obstruction or gangrene, not specified as recurrent: Secondary | ICD-10-CM | POA: Diagnosis not present

## 2024-03-28 LAB — CBC
HCT: 34.3 % — ABNORMAL LOW (ref 36.0–46.0)
Hemoglobin: 11.6 g/dL — ABNORMAL LOW (ref 12.0–15.0)
MCH: 33.7 pg (ref 26.0–34.0)
MCHC: 33.8 g/dL (ref 30.0–36.0)
MCV: 99.7 fL (ref 80.0–100.0)
Platelets: 193 K/uL (ref 150–400)
RBC: 3.44 MIL/uL — ABNORMAL LOW (ref 3.87–5.11)
RDW: 14.1 % (ref 11.5–15.5)
WBC: 8.4 K/uL (ref 4.0–10.5)
nRBC: 0 % (ref 0.0–0.2)

## 2024-03-28 LAB — COMPREHENSIVE METABOLIC PANEL WITH GFR
ALT: 14 U/L (ref 0–44)
AST: 20 U/L (ref 15–41)
Albumin: 4.4 g/dL (ref 3.5–5.0)
Alkaline Phosphatase: 53 U/L (ref 38–126)
Anion gap: 14 (ref 5–15)
BUN: 28 mg/dL — ABNORMAL HIGH (ref 8–23)
CO2: 18 mmol/L — ABNORMAL LOW (ref 22–32)
Calcium: 10.2 mg/dL (ref 8.9–10.3)
Chloride: 103 mmol/L (ref 98–111)
Creatinine, Ser: 1.53 mg/dL — ABNORMAL HIGH (ref 0.44–1.00)
GFR, Estimated: 37 mL/min — ABNORMAL LOW (ref >60)
Glucose, Bld: 115 mg/dL — ABNORMAL HIGH (ref 70–99)
Potassium: 3.5 mmol/L (ref 3.5–5.1)
Sodium: 135 mmol/L (ref 135–145)
Total Bilirubin: 0.7 mg/dL (ref 0.0–1.2)
Total Protein: 7.9 g/dL (ref 6.5–8.1)

## 2024-03-28 LAB — LIPASE, BLOOD: Lipase: 28 U/L (ref 11–51)

## 2024-03-28 MED ORDER — ONDANSETRON HCL 4 MG/2ML IJ SOLN
4.0000 mg | Freq: Once | INTRAMUSCULAR | Status: AC
  Administered 2024-03-29: 4 mg via INTRAVENOUS
  Filled 2024-03-28: qty 2

## 2024-03-28 MED ORDER — SODIUM CHLORIDE 0.9 % IV BOLUS
1000.0000 mL | Freq: Once | INTRAVENOUS | Status: AC
  Administered 2024-03-29: 1000 mL via INTRAVENOUS

## 2024-03-28 MED ORDER — MORPHINE SULFATE (PF) 4 MG/ML IV SOLN
4.0000 mg | Freq: Once | INTRAVENOUS | Status: AC
  Administered 2024-03-29: 4 mg via INTRAVENOUS
  Filled 2024-03-28: qty 1

## 2024-03-28 NOTE — ED Triage Notes (Signed)
 Patient brought in via [facility] today from home with complaints of abdominal pain around the naval. Patient endorses vomiting and diarrhea x2 days as well.

## 2024-03-28 NOTE — ED Provider Notes (Signed)
 [facility] Provider Note    Event Date/Time   First MD Initiated Contact with Patient 03/28/24 2307     (approximate)   History   Abdominal Pain   HPI  [name] is a 68 y.o. female with a history of lung cancer, COPD, CAD, CHF, hypertension, hyperlipidemia, and substance use disorder who presents with periumbilical and bilateral lower abdominal pain for the last 3 days, crampy but constant, associated with nausea, vomiting, and diarrhea.  The patient denies urinary symptoms.  She denies fever.  She states that this feels different than when she was in the ED with abdominal pain a few months ago.  I reviewed the past medical records.  The patient was seen in the ED on 5/21 with abdominal pain, vomiting, diarrhea.  She is status post cholecystectomy, appendectomy, and hysterectomy.  Workup including CT was negative at that time.   Physical Exam   Triage Vital Signs: ED Triage Vitals  Encounter Vitals Group     BP 03/28/24 2205 (!) 80/64     Girls Systolic BP Percentile --      Girls Diastolic BP Percentile --      Boys Systolic BP Percentile --      Boys Diastolic BP Percentile --      Pulse Rate 03/28/24 2205 95     Resp 03/28/24 2205 18     Temp 03/28/24 2205 97.7 F (36.5 C)     Temp Source 03/28/24 2205 Oral     SpO2 03/28/24 2157 97 %     Weight 03/28/24 2206 112 lb (50.8 kg)     Height 03/28/24 2206 5' 4 (1.626 m)     Head Circumference --      Peak Flow --      Pain Score 03/28/24 2206 5     Pain Loc --      Pain Education --      Exclude from Growth Chart --     Most recent vital signs: Vitals:   03/29/24 0112 03/29/24 0300  BP: 136/88 (!) 129/91  Pulse: 88 82  Resp: 18 20  Temp:  98 F (36.7 C)  SpO2: 100% 97%     General: Alert, uncomfortable appearing, no distress.  CV:  Good peripheral perfusion.  Resp:  Normal effort.  Abd:  Soft with moderate periumbilical tenderness.  No distention.  Other:  No jaundice or  scleral icterus.  Dry mucous membranes.   ED Results / Procedures / Treatments   Labs (all labs ordered are listed, but only abnormal results are displayed) Labs Reviewed  COMPREHENSIVE METABOLIC PANEL WITH GFR - Abnormal; Notable for the following components:      Result Value   CO2 18 (*)    Glucose, Bld 115 (*)    BUN 28 (*)    Creatinine, Ser 1.53 (*)    GFR, Estimated 37 (*)    All other components within normal limits  CBC - Abnormal; Notable for the following components:   RBC 3.44 (*)    Hemoglobin 11.6 (*)    HCT 34.3 (*)    All other components within normal limits  URINALYSIS, ROUTINE W REFLEX MICROSCOPIC - Abnormal; Notable for the following components:   Color, Urine YELLOW (*)    APPearance HAZY (*)    Specific Gravity, Urine 1.044 (*)    Leukocytes,Ua LARGE (*)    Bacteria, UA RARE (*)    All other components within normal limits  LIPASE, BLOOD  LACTIC ACID, PLASMA     EKG     RADIOLOGY  CT abdomen/pelvis: I independently viewed and interpreted the images; there are no dilated bowel loops or any free air or free fluid.  Radiology report indicates the following:  IMPRESSION:  1. No acute intra-abdominal/intrapelvic pathology identified.  2. Extensive aortoiliac and visceral atherosclerotic calcification.  Multiple visceral and lower extremity arterial inflow and outflow  stenoses as outlined above. If there is clinical evidence of chronic  mesenteric ischemia or hemodynamically significant renal artery  stenosis, these findings can be further evaluated with CT  angiography of the abdomen and pelvis.  3. Mild hepatic steatosis.  4. Status post cholecystectomy. Stable probable post cholecystectomy  change.  5. Complete pancreatic divisum. No evidence of acute pancreatitis.    Aortic Atherosclerosis (ICD10-I70.0).     PROCEDURES:  Critical Care performed: No  Procedures   MEDICATIONS ORDERED IN ED: Medications  ondansetron  (ZOFRAN )  injection 4 mg (4 mg Intravenous Given 03/29/24 0010)  morphine  (PF) 4 MG/ML injection 4 mg (4 mg Intravenous Given 03/29/24 0010)  sodium chloride  0.9 % bolus 1,000 mL (0 mLs Intravenous Stopped 03/29/24 0212)  iohexol  (OMNIPAQUE ) 300 MG/ML solution 80 mL (80 mLs Intravenous Contrast Given 03/29/24 0030)  dicyclomine  (BENTYL ) capsule 10 mg (10 mg Oral Given 03/29/24 0356)  famotidine  (PEPCID ) IVPB 20 mg premix (0 mg Intravenous Stopped 03/29/24 0502)     IMPRESSION / MDM / ASSESSMENT AND PLAN / ED COURSE  I reviewed the triage vital signs and the nursing notes.  68 year old female with PMH as noted above presents with periumbilical and lower abdominal pain for the last 3 days with associated vomiting and diarrhea.  She is tender in the lower abdomen.  The vital signs are normal.  Differential diagnosis includes, but is not limited to, diverticulitis, colitis, gastroenteritis, foodborne illness, mesenteric adenitis, terminal ileitis, bowel obstruction, volvulus, IBS.  CBC shows no leukocytosis.  CMP shows mildly elevated creatinine, baseline for the patient, but no acute findings.  Lipase is negative.  We will obtain CT for further evaluation and give fluids, antiemetic, and analgesia.  Patient's presentation is most consistent with acute complicated illness / injury requiring diagnostic workup.  ----------------------------------------- 6:01 AM on 03/29/2024 -----------------------------------------  CT is negative for acute findings.  There were extensive calcifications with a concern for possible chronic ischemia, although the patient has no signs of acute ischemic colitis or other vascular emergency.  I added on a lactate which is negative.  On reassessment, the patient reports significantly improved pain.  I gave Bentyl  and Pepcid  as well and she states that she is feeling a lot better.  She is tolerating p.o.  The overall presentation is most consistent with viral gastroenteritis, exacerbation  of chronic abdominal pain, or other benign etiology.  The patient is stable for discharge home at this time.  I gave strict return precautions, and she expressed understanding.   FINAL CLINICAL IMPRESSION(S) / ED DIAGNOSES   Final diagnoses:  Nausea vomiting and diarrhea  Generalized abdominal pain     Rx / DC Orders   ED Discharge Orders          Ordered    dicyclomine  (BENTYL ) 10 MG capsule  Every 8 hours PRN        03/29/24 0540    ondansetron  (ZOFRAN -ODT) 4 MG disintegrating tablet  Every 8 hours PRN        03/29/24 0540             Note:  This document was prepared using Dragon voice recognition software and may include unintentional dictation errors.    Jacolyn Pae, MD 03/29/24 [phone]

## 2024-03-29 ENCOUNTER — Emergency Department

## 2024-03-29 DIAGNOSIS — R1084 Generalized abdominal pain: Secondary | ICD-10-CM | POA: Diagnosis not present

## 2024-03-29 DIAGNOSIS — K4090 Unilateral inguinal hernia, without obstruction or gangrene, not specified as recurrent: Secondary | ICD-10-CM | POA: Diagnosis not present

## 2024-03-29 DIAGNOSIS — N2889 Other specified disorders of kidney and ureter: Secondary | ICD-10-CM | POA: Diagnosis not present

## 2024-03-29 DIAGNOSIS — K551 Chronic vascular disorders of intestine: Secondary | ICD-10-CM | POA: Diagnosis not present

## 2024-03-29 DIAGNOSIS — R109 Unspecified abdominal pain: Secondary | ICD-10-CM | POA: Diagnosis not present

## 2024-03-29 LAB — URINALYSIS, ROUTINE W REFLEX MICROSCOPIC
Bilirubin Urine: NEGATIVE
Glucose, UA: NEGATIVE mg/dL
Hgb urine dipstick: NEGATIVE
Ketones, ur: NEGATIVE mg/dL
Nitrite: NEGATIVE
Protein, ur: NEGATIVE mg/dL
Specific Gravity, Urine: 1.044 — ABNORMAL HIGH (ref 1.005–1.030)
pH: 5 (ref 5.0–8.0)

## 2024-03-29 LAB — LACTIC ACID, PLASMA: Lactic Acid, Venous: 0.7 mmol/L (ref 0.5–1.9)

## 2024-03-29 MED ORDER — DICYCLOMINE HCL 10 MG PO CAPS
10.0000 mg | ORAL_CAPSULE | Freq: Once | ORAL | Status: AC
  Administered 2024-03-29: 10 mg via ORAL
  Filled 2024-03-29: qty 1

## 2024-03-29 MED ORDER — FAMOTIDINE IN NACL 20-0.9 MG/50ML-% IV SOLN
20.0000 mg | Freq: Once | INTRAVENOUS | Status: AC
  Administered 2024-03-29: 20 mg via INTRAVENOUS
  Filled 2024-03-29: qty 50

## 2024-03-29 MED ORDER — IOHEXOL 300 MG/ML  SOLN
80.0000 mL | Freq: Once | INTRAMUSCULAR | Status: AC | PRN
  Administered 2024-03-29: 80 mL via INTRAVENOUS

## 2024-03-29 MED ORDER — DICYCLOMINE HCL 10 MG PO CAPS: 10.0000 mg | ORAL_CAPSULE | Freq: Three times a day (TID) | ORAL | 0 refills | Status: DC | PRN

## 2024-03-29 MED ORDER — ONDANSETRON 4 MG PO TBDP: 4.0000 mg | ORAL_TABLET | Freq: Three times a day (TID) | ORAL | 0 refills | Status: DC | PRN

## 2024-03-29 NOTE — ED Notes (Signed)
 Gave pt some water and encouraged her to give a urine sample

## 2024-03-29 NOTE — ED Notes (Signed)
Pt encouraged to give a urine sample.  

## 2024-03-29 NOTE — Discharge Instructions (Signed)
 Take the Bentyl  needed for pain and Zofran  as needed for nausea.  Follow-up with your primary care provider.  Return to the ER for new, worsening, or persistent severe abdominal pain, vomiting, weakness, or any other new or worsening symptoms that concern you.

## 2024-04-03 ENCOUNTER — Encounter: Payer: Self-pay | Admitting: Emergency Medicine

## 2024-04-03 ENCOUNTER — Other Ambulatory Visit: Payer: Self-pay

## 2024-04-03 ENCOUNTER — Emergency Department

## 2024-04-03 ENCOUNTER — Emergency Department
Admission: EM | Admit: 2024-04-03 | Discharge: 2024-04-03 | Disposition: A | Discharge status: Home / Self Care | Attending: Emergency Medicine | Admitting: Emergency Medicine

## 2024-04-03 DIAGNOSIS — J449 Chronic obstructive pulmonary disease, unspecified: Secondary | ICD-10-CM | POA: Insufficient documentation

## 2024-04-03 DIAGNOSIS — I2510 Atherosclerotic heart disease of native coronary artery without angina pectoris: Secondary | ICD-10-CM | POA: Insufficient documentation

## 2024-04-03 DIAGNOSIS — I110 Hypertensive heart disease with heart failure: Secondary | ICD-10-CM | POA: Diagnosis not present

## 2024-04-03 DIAGNOSIS — N390 Urinary tract infection, site not specified: Secondary | ICD-10-CM | POA: Diagnosis not present

## 2024-04-03 DIAGNOSIS — R112 Nausea with vomiting, unspecified: Secondary | ICD-10-CM | POA: Insufficient documentation

## 2024-04-03 DIAGNOSIS — R1084 Generalized abdominal pain: Secondary | ICD-10-CM

## 2024-04-03 DIAGNOSIS — I509 Heart failure, unspecified: Secondary | ICD-10-CM | POA: Insufficient documentation

## 2024-04-03 DIAGNOSIS — M48061 Spinal stenosis, lumbar region without neurogenic claudication: Secondary | ICD-10-CM | POA: Diagnosis not present

## 2024-04-03 DIAGNOSIS — E876 Hypokalemia: Secondary | ICD-10-CM | POA: Insufficient documentation

## 2024-04-03 DIAGNOSIS — N281 Cyst of kidney, acquired: Secondary | ICD-10-CM | POA: Diagnosis not present

## 2024-04-03 DIAGNOSIS — Z743 Need for continuous supervision: Secondary | ICD-10-CM | POA: Diagnosis not present

## 2024-04-03 DIAGNOSIS — M4807 Spinal stenosis, lumbosacral region: Secondary | ICD-10-CM | POA: Diagnosis not present

## 2024-04-03 DIAGNOSIS — Z9049 Acquired absence of other specified parts of digestive tract: Secondary | ICD-10-CM | POA: Diagnosis not present

## 2024-04-03 DIAGNOSIS — R109 Unspecified abdominal pain: Secondary | ICD-10-CM | POA: Diagnosis not present

## 2024-04-03 DIAGNOSIS — M51379 Other intervertebral disc degeneration, lumbosacral region without mention of lumbar back pain or lower extremity pain: Secondary | ICD-10-CM | POA: Diagnosis not present

## 2024-04-03 DIAGNOSIS — M47816 Spondylosis without myelopathy or radiculopathy, lumbar region: Secondary | ICD-10-CM | POA: Diagnosis not present

## 2024-04-03 DIAGNOSIS — M549 Dorsalgia, unspecified: Secondary | ICD-10-CM | POA: Diagnosis not present

## 2024-04-03 LAB — URINALYSIS, ROUTINE W REFLEX MICROSCOPIC
Bilirubin Urine: NEGATIVE
Glucose, UA: NEGATIVE mg/dL
Hgb urine dipstick: NEGATIVE
Ketones, ur: NEGATIVE mg/dL
Nitrite: NEGATIVE
Protein, ur: NEGATIVE mg/dL
Specific Gravity, Urine: 1.024 (ref 1.005–1.030)
pH: 5 (ref 5.0–8.0)

## 2024-04-03 LAB — COMPREHENSIVE METABOLIC PANEL WITH GFR
ALT: 14 U/L (ref 0–44)
AST: 22 U/L (ref 15–41)
Albumin: 4 g/dL (ref 3.5–5.0)
Alkaline Phosphatase: 50 U/L (ref 38–126)
Anion gap: 15 (ref 5–15)
BUN: 21 mg/dL (ref 8–23)
CO2: 20 mmol/L — ABNORMAL LOW (ref 22–32)
Calcium: 10.1 mg/dL (ref 8.9–10.3)
Chloride: 101 mmol/L (ref 98–111)
Creatinine, Ser: 1.82 mg/dL — ABNORMAL HIGH (ref 0.44–1.00)
GFR, Estimated: 30 mL/min — ABNORMAL LOW (ref >60)
Glucose, Bld: 86 mg/dL (ref 70–99)
Potassium: 3.2 mmol/L — ABNORMAL LOW (ref 3.5–5.1)
Sodium: 136 mmol/L (ref 135–145)
Total Bilirubin: 0.7 mg/dL (ref 0.0–1.2)
Total Protein: 7.3 g/dL (ref 6.5–8.1)

## 2024-04-03 LAB — CBC
HCT: 35.7 % — ABNORMAL LOW (ref 36.0–46.0)
Hemoglobin: 11.9 g/dL — ABNORMAL LOW (ref 12.0–15.0)
MCH: 33.1 pg (ref 26.0–34.0)
MCHC: 33.3 g/dL (ref 30.0–36.0)
MCV: 99.2 fL (ref 80.0–100.0)
Platelets: 262 K/uL (ref 150–400)
RBC: 3.6 MIL/uL — ABNORMAL LOW (ref 3.87–5.11)
RDW: 13.3 % (ref 11.5–15.5)
WBC: 6.1 K/uL (ref 4.0–10.5)
nRBC: 0 % (ref 0.0–0.2)

## 2024-04-03 LAB — LIPASE, BLOOD: Lipase: 29 U/L (ref 11–51)

## 2024-04-03 LAB — MAGNESIUM: Magnesium: 1.4 mg/dL — ABNORMAL LOW (ref 1.7–2.4)

## 2024-04-03 MED ORDER — MAGNESIUM SULFATE 2 GM/50ML IV SOLN
2.0000 g | Freq: Once | INTRAVENOUS | Status: AC
  Administered 2024-04-03: 2 g via INTRAVENOUS
  Filled 2024-04-03: qty 50

## 2024-04-03 MED ORDER — IOHEXOL 300 MG/ML  SOLN
60.0000 mL | Freq: Once | INTRAMUSCULAR | Status: AC | PRN
  Administered 2024-04-03: 60 mL via INTRAVENOUS

## 2024-04-03 MED ORDER — ONDANSETRON HCL 4 MG/2ML IJ SOLN
4.0000 mg | Freq: Once | INTRAMUSCULAR | Status: AC
  Administered 2024-04-03: 4 mg via INTRAVENOUS
  Filled 2024-04-03: qty 2

## 2024-04-03 MED ORDER — ONDANSETRON 4 MG PO TBDP: 4.0000 mg | ORAL_TABLET | Freq: Three times a day (TID) | ORAL | 0 refills | Status: DC | PRN

## 2024-04-03 MED ORDER — CEPHALEXIN 500 MG PO CAPS: 500.0000 mg | ORAL_CAPSULE | Freq: Two times a day (BID) | ORAL | 0 refills | Status: DC

## 2024-04-03 MED ORDER — MORPHINE SULFATE (PF) 4 MG/ML IV SOLN
4.0000 mg | Freq: Once | INTRAVENOUS | Status: AC
  Administered 2024-04-03: 4 mg via INTRAVENOUS
  Filled 2024-04-03: qty 1

## 2024-04-03 MED ORDER — POTASSIUM CHLORIDE CRYS ER 20 MEQ PO TBCR
40.0000 meq | EXTENDED_RELEASE_TABLET | Freq: Once | ORAL | Status: AC
  Administered 2024-04-03: 40 meq via ORAL
  Filled 2024-04-03: qty 2

## 2024-04-03 MED ORDER — CEPHALEXIN 500 MG PO CAPS
500.0000 mg | ORAL_CAPSULE | Freq: Once | ORAL | Status: AC
  Administered 2024-04-03: 500 mg via ORAL
  Filled 2024-04-03: qty 1

## 2024-04-03 MED ORDER — LACTATED RINGERS IV BOLUS
1000.0000 mL | Freq: Once | INTRAVENOUS | Status: AC
  Administered 2024-04-03: 1000 mL via INTRAVENOUS

## 2024-04-03 NOTE — ED Notes (Signed)
States she feels better.

## 2024-04-03 NOTE — ED Notes (Signed)
 See triage note  Presents with cont'd abd pain  states pain radiates into lower back   States she was seen for same  Has ran out of meds  Denies any fever

## 2024-04-03 NOTE — ED Notes (Signed)
 Patient taken to CT scan.

## 2024-04-03 NOTE — ED Triage Notes (Signed)
 First nurse note:Arrived by [facility] from home. C/o abdominal pain and back pain. Seen 7/26 for same and discharged home.  Takes prescription pain meds and muscle relaxers  120/88 b/p 70s HR

## 2024-04-03 NOTE — ED Provider Notes (Signed)
 [facility] Provider Note    Event Date/Time   First MD Initiated Contact with Patient 04/03/24 1216     (approximate)   History   Chief Complaint Abdominal Pain   HPI  [name] is a 68 y.o. female with past medical history of hypertension, hyperlipidemia, CAD, CHF, COPD, and alcohol abuse who presents to the ED complaining of abdominal pain.  Patient reports that she has had about 1 week of constant pain around the center of her abdomen associated with nausea, vomiting, and diarrhea.  She reports difficulty keeping down solids during this time, but has been able to keep down liquids.  She has had watery stool but denies any blood in her stool.  She is not aware of any fevers, dysuria, or or flank pain but does endorse some pain in the middle of her lower back.  She denies any recent falls.  She states that she has not had alcohol in the past 3 weeks.     Physical Exam   Triage Vital Signs: ED Triage Vitals  Encounter Vitals Group     BP 04/03/24 1152 122/83     Girls Systolic BP Percentile --      Girls Diastolic BP Percentile --      Boys Systolic BP Percentile --      Boys Diastolic BP Percentile --      Pulse Rate 04/03/24 1152 74     Resp 04/03/24 1152 16     Temp 04/03/24 1152 98.1 F (36.7 C)     Temp Source 04/03/24 1152 Oral     SpO2 04/03/24 1152 97 %     Weight 04/03/24 1156 111 lb 15.9 oz (50.8 kg)     Height 04/03/24 1156 5' 4 (1.626 m)     Head Circumference --      Peak Flow --      Pain Score 04/03/24 1154 10     Pain Loc --      Pain Education --      Exclude from Growth Chart --     Most recent vital signs: Vitals:   04/03/24 1644 04/03/24 1741  BP: 120/80   Pulse: 70   Resp: 16   Temp: 98 F (36.7 C)   SpO2: 98% 98%    Constitutional: Alert and oriented. Eyes: Conjunctivae are normal. Head: Atraumatic. Nose: No congestion/rhinnorhea. Mouth/Throat: Mucous membranes are moist.  Cardiovascular: Normal rate,  regular rhythm. Grossly normal heart sounds.  2+ radial pulses bilaterally. Respiratory: Normal respiratory effort.  No retractions. Lungs CTAB. Gastrointestinal: Soft with periumbilical tenderness, no rebound or guarding noted.  No CVA tenderness bilaterally. No distention. Musculoskeletal: No lower extremity tenderness nor edema.  Midline lumbar spinal tenderness to palpation. Neurologic:  Normal speech and language. No gross focal neurologic deficits are appreciated.    ED Results / Procedures / Treatments   Labs (all labs ordered are listed, but only abnormal results are displayed) Labs Reviewed  COMPREHENSIVE METABOLIC PANEL WITH GFR - Abnormal; Notable for the following components:      Result Value   Potassium 3.2 (*)    CO2 20 (*)    Creatinine, Ser 1.82 (*)    GFR, Estimated 30 (*)    All other components within normal limits  CBC - Abnormal; Notable for the following components:   RBC 3.60 (*)    Hemoglobin 11.9 (*)    HCT 35.7 (*)    All other components within normal limits  URINALYSIS,  ROUTINE W REFLEX MICROSCOPIC - Abnormal; Notable for the following components:   Color, Urine YELLOW (*)    APPearance HAZY (*)    Leukocytes,Ua LARGE (*)    Bacteria, UA RARE (*)    All other components within normal limits  MAGNESIUM  - Abnormal; Notable for the following components:   Magnesium  1.4 (*)    All other components within normal limits  URINE CULTURE  LIPASE, BLOOD    RADIOLOGY CT abdomen/pelvis reviewed and interpreted by me with no inflammatory changes, focal fluid collections, or dilated bowel loops.  PROCEDURES:  Critical Care performed: No  Procedures   MEDICATIONS ORDERED IN ED: Medications  morphine  (PF) 4 MG/ML injection 4 mg (4 mg Intravenous Given 04/03/24 1330)  ondansetron  (ZOFRAN ) injection 4 mg (4 mg Intravenous Given 04/03/24 1330)  lactated ringers  bolus 1,000 mL (0 mLs Intravenous Stopped 04/03/24 1506)  magnesium  sulfate IVPB 2 g 50 mL (0 g  Intravenous Stopped 04/03/24 1506)  potassium chloride  SA (KLOR-CON  M) CR tablet 40 mEq (40 mEq Oral Given 04/03/24 1405)  iohexol  (OMNIPAQUE ) 300 MG/ML solution 60 mL (60 mLs Intravenous Contrast Given 04/03/24 1511)     IMPRESSION / MDM / ASSESSMENT AND PLAN / ED COURSE  I reviewed the triage vital signs and the nursing notes.                              68 y.o. female with past medical history of hypertension, hyperlipidemia, CAD, CHF, COPD, and alcohol abuse who presents to the ED with about 1 week of periumbilical abdominal pain associate with nausea, vomiting, and diarrhea.  Patient's presentation is most consistent with acute presentation with potential threat to life or bodily function.  Differential diagnosis includes, but is not limited to, gastroenteritis, dehydration, electrolyte abnormality, AKI, umbilical hernia, pancreatitis, hepatitis, cholecystitis, biliary colic, appendicitis, diverticulitis, UTI, kidney stone.  Patient nontoxic-appearing and in no acute distress, vital signs are unremarkable.  Her abdomen is soft but she does have significant tenderness in her periumbilical area, no obvious hernia noted.  I did review her CT imaging from prior ED visit, which was unremarkable.  Given ongoing symptoms, will check repeat CT imaging, labs are pending at this time.  We will treat symptomatically with IV morphine  and Zofran , hydrate with IV fluids and reassess.  Labs with renal function similar to previous, mild hypokalemia and hypomagnesemia noted, which were repleted.  LFTs and lipase are unremarkable, no significant anemia or leukocytosis noted.  CT of the abdomen/pelvis as well as the lumbar spine are unremarkable.  Urinalysis does appear concerning for UTI, will send for culture.  Patient feeling better on reassessment, has eaten a full sandwich and bag of chips here in the ED.  She is appropriate for discharge home with outpatient follow-up, was counseled to return to the ED for new  or worsening symptoms.  Patient agrees with plan.      FINAL CLINICAL IMPRESSION(S) / ED DIAGNOSES   Final diagnoses:  Generalized abdominal pain  Lower urinary tract infectious disease  Nausea and vomiting, unspecified vomiting type     Rx / DC Orders   ED Discharge Orders          Ordered    ondansetron  (ZOFRAN -ODT) 4 MG disintegrating tablet  Every 8 hours PRN        04/03/24 1809    cephALEXin (KEFLEX) 500 MG capsule  2 times daily  04/03/24 1809             Note:  This document was prepared using Dragon voice recognition software and may include unintentional dictation errors.   Willo Dunnings, MD 04/03/24 [id number]

## 2024-04-03 NOTE — ED Triage Notes (Signed)
 Pt to ED via [facility] from home for c/o abd pain and back pain. Seen recently for same and discharged home.

## 2024-04-04 LAB — URINE CULTURE: Culture: NO GROWTH

## 2024-04-05 ENCOUNTER — Other Ambulatory Visit: Payer: Self-pay

## 2024-04-05 DIAGNOSIS — Z8249 Family history of ischemic heart disease and other diseases of the circulatory system: Secondary | ICD-10-CM

## 2024-04-05 DIAGNOSIS — A599 Trichomoniasis, unspecified: Secondary | ICD-10-CM | POA: Diagnosis present

## 2024-04-05 DIAGNOSIS — I130 Hypertensive heart and chronic kidney disease with heart failure and stage 1 through stage 4 chronic kidney disease, or unspecified chronic kidney disease: Secondary | ICD-10-CM | POA: Diagnosis present

## 2024-04-05 DIAGNOSIS — Z860100 Personal history of colon polyps, unspecified: Secondary | ICD-10-CM

## 2024-04-05 DIAGNOSIS — K559 Vascular disorder of intestine, unspecified: Secondary | ICD-10-CM | POA: Diagnosis not present

## 2024-04-05 DIAGNOSIS — I739 Peripheral vascular disease, unspecified: Secondary | ICD-10-CM | POA: Diagnosis present

## 2024-04-05 DIAGNOSIS — I4891 Unspecified atrial fibrillation: Secondary | ICD-10-CM | POA: Diagnosis present

## 2024-04-05 DIAGNOSIS — E785 Hyperlipidemia, unspecified: Secondary | ICD-10-CM | POA: Diagnosis present

## 2024-04-05 DIAGNOSIS — Z7902 Long term (current) use of antithrombotics/antiplatelets: Secondary | ICD-10-CM | POA: Diagnosis not present

## 2024-04-05 DIAGNOSIS — N182 Chronic kidney disease, stage 2 (mild): Secondary | ICD-10-CM | POA: Diagnosis present

## 2024-04-05 DIAGNOSIS — Q272 Other congenital malformations of renal artery: Secondary | ICD-10-CM | POA: Diagnosis not present

## 2024-04-05 DIAGNOSIS — I2583 Coronary atherosclerosis due to lipid rich plaque: Secondary | ICD-10-CM | POA: Diagnosis present

## 2024-04-05 DIAGNOSIS — Z716 Tobacco abuse counseling: Secondary | ICD-10-CM

## 2024-04-05 DIAGNOSIS — Z79899 Other long term (current) drug therapy: Secondary | ICD-10-CM | POA: Diagnosis not present

## 2024-04-05 DIAGNOSIS — I2510 Atherosclerotic heart disease of native coronary artery without angina pectoris: Secondary | ICD-10-CM | POA: Diagnosis present

## 2024-04-05 DIAGNOSIS — I10 Essential (primary) hypertension: Secondary | ICD-10-CM | POA: Diagnosis not present

## 2024-04-05 DIAGNOSIS — R109 Unspecified abdominal pain: Secondary | ICD-10-CM | POA: Diagnosis not present

## 2024-04-05 DIAGNOSIS — I700 Atherosclerosis of aorta: Secondary | ICD-10-CM | POA: Diagnosis present

## 2024-04-05 DIAGNOSIS — K55059 Acute (reversible) ischemia of intestine, part and extent unspecified: Principal | ICD-10-CM | POA: Diagnosis present

## 2024-04-05 DIAGNOSIS — K567 Ileus, unspecified: Secondary | ICD-10-CM | POA: Diagnosis not present

## 2024-04-05 DIAGNOSIS — Z7951 Long term (current) use of inhaled steroids: Secondary | ICD-10-CM | POA: Diagnosis not present

## 2024-04-05 DIAGNOSIS — Z888 Allergy status to other drugs, medicaments and biological substances status: Secondary | ICD-10-CM

## 2024-04-05 DIAGNOSIS — I7090 Unspecified atherosclerosis: Secondary | ICD-10-CM | POA: Diagnosis not present

## 2024-04-05 DIAGNOSIS — I252 Old myocardial infarction: Secondary | ICD-10-CM

## 2024-04-05 DIAGNOSIS — Z09 Encounter for follow-up examination after completed treatment for conditions other than malignant neoplasm: Secondary | ICD-10-CM | POA: Diagnosis not present

## 2024-04-05 DIAGNOSIS — K551 Chronic vascular disorders of intestine: Secondary | ICD-10-CM | POA: Diagnosis not present

## 2024-04-05 DIAGNOSIS — I701 Atherosclerosis of renal artery: Secondary | ICD-10-CM | POA: Diagnosis not present

## 2024-04-05 DIAGNOSIS — K56609 Unspecified intestinal obstruction, unspecified as to partial versus complete obstruction: Secondary | ICD-10-CM | POA: Diagnosis not present

## 2024-04-05 DIAGNOSIS — Z7982 Long term (current) use of aspirin: Secondary | ICD-10-CM

## 2024-04-05 DIAGNOSIS — F32A Depression, unspecified: Secondary | ICD-10-CM | POA: Diagnosis present

## 2024-04-05 DIAGNOSIS — I272 Pulmonary hypertension, unspecified: Secondary | ICD-10-CM | POA: Diagnosis present

## 2024-04-05 DIAGNOSIS — Z885 Allergy status to narcotic agent status: Secondary | ICD-10-CM | POA: Diagnosis not present

## 2024-04-05 DIAGNOSIS — N179 Acute kidney failure, unspecified: Secondary | ICD-10-CM | POA: Diagnosis present

## 2024-04-05 DIAGNOSIS — F17210 Nicotine dependence, cigarettes, uncomplicated: Secondary | ICD-10-CM | POA: Diagnosis present

## 2024-04-05 DIAGNOSIS — Z90711 Acquired absence of uterus with remaining cervical stump: Secondary | ICD-10-CM

## 2024-04-05 DIAGNOSIS — Z604 Social exclusion and rejection: Secondary | ICD-10-CM | POA: Diagnosis present

## 2024-04-05 DIAGNOSIS — I708 Atherosclerosis of other arteries: Secondary | ICD-10-CM | POA: Diagnosis not present

## 2024-04-05 DIAGNOSIS — J4489 Other specified chronic obstructive pulmonary disease: Secondary | ICD-10-CM | POA: Diagnosis present

## 2024-04-05 DIAGNOSIS — R140 Abdominal distension (gaseous): Secondary | ICD-10-CM | POA: Diagnosis not present

## 2024-04-05 DIAGNOSIS — I5032 Chronic diastolic (congestive) heart failure: Secondary | ICD-10-CM | POA: Diagnosis present

## 2024-04-05 DIAGNOSIS — Z85118 Personal history of other malignant neoplasm of bronchus and lung: Secondary | ICD-10-CM | POA: Diagnosis not present

## 2024-04-05 DIAGNOSIS — R1033 Periumbilical pain: Secondary | ICD-10-CM | POA: Diagnosis not present

## 2024-04-05 DIAGNOSIS — I774 Celiac artery compression syndrome: Secondary | ICD-10-CM | POA: Diagnosis not present

## 2024-04-05 DIAGNOSIS — Z9049 Acquired absence of other specified parts of digestive tract: Secondary | ICD-10-CM

## 2024-04-05 LAB — COMPREHENSIVE METABOLIC PANEL WITH GFR
ALT: 16 U/L (ref 0–44)
AST: 23 U/L (ref 15–41)
Albumin: 4.2 g/dL (ref 3.5–5.0)
Alkaline Phosphatase: 61 U/L (ref 38–126)
Anion gap: 12 (ref 5–15)
BUN: 16 mg/dL (ref 8–23)
CO2: 22 mmol/L (ref 22–32)
Calcium: 10.2 mg/dL (ref 8.9–10.3)
Chloride: 103 mmol/L (ref 98–111)
Creatinine, Ser: 1.2 mg/dL — ABNORMAL HIGH (ref 0.44–1.00)
GFR, Estimated: 49 mL/min — ABNORMAL LOW (ref >60)
Glucose, Bld: 104 mg/dL — ABNORMAL HIGH (ref 70–99)
Potassium: 3.3 mmol/L — ABNORMAL LOW (ref 3.5–5.1)
Sodium: 137 mmol/L (ref 135–145)
Total Bilirubin: 0.4 mg/dL (ref 0.0–1.2)
Total Protein: 7.8 g/dL (ref 6.5–8.1)

## 2024-04-05 LAB — CBC
HCT: 34 % — ABNORMAL LOW (ref 36.0–46.0)
Hemoglobin: 11.2 g/dL — ABNORMAL LOW (ref 12.0–15.0)
MCH: 32.9 pg (ref 26.0–34.0)
MCHC: 32.9 g/dL (ref 30.0–36.0)
MCV: 100 fL (ref 80.0–100.0)
Platelets: 301 K/uL (ref 150–400)
RBC: 3.4 MIL/uL — ABNORMAL LOW (ref 3.87–5.11)
RDW: 13.4 % (ref 11.5–15.5)
WBC: 6 K/uL (ref 4.0–10.5)
nRBC: 0 % (ref 0.0–0.2)

## 2024-04-05 LAB — LIPASE, BLOOD: Lipase: 31 U/L (ref 11–51)

## 2024-04-05 NOTE — ED Triage Notes (Signed)
 Pt to ed from home via [facility] for abd pain. Just seen Friday here for same. All vitals WNL. Pt is caox4, in no acute distress and ambulatory on scene fro EMS.

## 2024-04-06 ENCOUNTER — Encounter: Payer: Self-pay | Admitting: Internal Medicine

## 2024-04-06 ENCOUNTER — Emergency Department

## 2024-04-06 ENCOUNTER — Encounter: Admission: EM | Disposition: A | Discharge status: Home / Self Care | Payer: Self-pay | Source: Home / Self Care

## 2024-04-06 ENCOUNTER — Inpatient Hospital Stay
Admission: EM | Admit: 2024-04-06 | Discharge: 2024-04-10 | DRG: 357 | Disposition: A | Discharge status: Home / Self Care

## 2024-04-06 DIAGNOSIS — I2510 Atherosclerotic heart disease of native coronary artery without angina pectoris: Secondary | ICD-10-CM | POA: Diagnosis present

## 2024-04-06 DIAGNOSIS — C3411 Malignant neoplasm of upper lobe, right bronchus or lung: Secondary | ICD-10-CM | POA: Diagnosis present

## 2024-04-06 DIAGNOSIS — Q272 Other congenital malformations of renal artery: Secondary | ICD-10-CM | POA: Diagnosis not present

## 2024-04-06 DIAGNOSIS — K551 Chronic vascular disorders of intestine: Secondary | ICD-10-CM | POA: Diagnosis not present

## 2024-04-06 DIAGNOSIS — I708 Atherosclerosis of other arteries: Secondary | ICD-10-CM | POA: Diagnosis not present

## 2024-04-06 DIAGNOSIS — R109 Unspecified abdominal pain: Secondary | ICD-10-CM | POA: Diagnosis not present

## 2024-04-06 DIAGNOSIS — I739 Peripheral vascular disease, unspecified: Secondary | ICD-10-CM

## 2024-04-06 DIAGNOSIS — F17200 Nicotine dependence, unspecified, uncomplicated: Secondary | ICD-10-CM | POA: Diagnosis present

## 2024-04-06 DIAGNOSIS — I774 Celiac artery compression syndrome: Secondary | ICD-10-CM | POA: Diagnosis present

## 2024-04-06 DIAGNOSIS — I701 Atherosclerosis of renal artery: Secondary | ICD-10-CM

## 2024-04-06 DIAGNOSIS — I700 Atherosclerosis of aorta: Secondary | ICD-10-CM | POA: Diagnosis not present

## 2024-04-06 DIAGNOSIS — R1033 Periumbilical pain: Secondary | ICD-10-CM | POA: Diagnosis not present

## 2024-04-06 DIAGNOSIS — I10 Essential (primary) hypertension: Secondary | ICD-10-CM | POA: Diagnosis present

## 2024-04-06 DIAGNOSIS — F32A Depression, unspecified: Secondary | ICD-10-CM | POA: Diagnosis present

## 2024-04-06 DIAGNOSIS — A599 Trichomoniasis, unspecified: Secondary | ICD-10-CM | POA: Insufficient documentation

## 2024-04-06 DIAGNOSIS — K559 Vascular disorder of intestine, unspecified: Secondary | ICD-10-CM | POA: Diagnosis present

## 2024-04-06 DIAGNOSIS — J449 Chronic obstructive pulmonary disease, unspecified: Secondary | ICD-10-CM | POA: Diagnosis present

## 2024-04-06 HISTORY — PX: VISCERAL ANGIOGRAPHY: CATH118276

## 2024-04-06 LAB — WET PREP, GENITAL
Clue Cells Wet Prep HPF POC: NONE SEEN
Sperm: NONE SEEN
WBC, Wet Prep HPF POC: >=10 — AB (ref <10)
Yeast Wet Prep HPF POC: NONE SEEN

## 2024-04-06 LAB — BASIC METABOLIC PANEL WITH GFR
Anion gap: 7 (ref 5–15)
BUN: 13 mg/dL (ref 8–23)
CO2: 20 mmol/L — ABNORMAL LOW (ref 22–32)
Calcium: 8.6 mg/dL — ABNORMAL LOW (ref 8.9–10.3)
Chloride: 109 mmol/L (ref 98–111)
Creatinine, Ser: 1.08 mg/dL — ABNORMAL HIGH (ref 0.44–1.00)
GFR, Estimated: 56 mL/min — ABNORMAL LOW (ref >60)
Glucose, Bld: 82 mg/dL (ref 70–99)
Potassium: 3.5 mmol/L (ref 3.5–5.1)
Sodium: 136 mmol/L (ref 135–145)

## 2024-04-06 LAB — CBC
HCT: 27.8 % — ABNORMAL LOW (ref 36.0–46.0)
Hemoglobin: 8.9 g/dL — ABNORMAL LOW (ref 12.0–15.0)
MCH: 32.2 pg (ref 26.0–34.0)
MCHC: 32 g/dL (ref 30.0–36.0)
MCV: 100.7 fL — ABNORMAL HIGH (ref 80.0–100.0)
Platelets: 224 K/uL (ref 150–400)
RBC: 2.76 MIL/uL — ABNORMAL LOW (ref 3.87–5.11)
RDW: 13.4 % (ref 11.5–15.5)
WBC: 4.7 K/uL (ref 4.0–10.5)
nRBC: 0 % (ref 0.0–0.2)

## 2024-04-06 LAB — RPR: RPR Ser Ql: NONREACTIVE

## 2024-04-06 LAB — LACTIC ACID, PLASMA
Lactic Acid, Venous: 2 mmol/L (ref 0.5–1.9)
Lactic Acid, Venous: 1.3 mmol/L (ref 0.5–1.9)

## 2024-04-06 LAB — HEPATITIS PANEL, ACUTE
HCV Ab: NONREACTIVE
Hep A IgM: NONREACTIVE
Hep B C IgM: NONREACTIVE
Hepatitis B Surface Ag: NONREACTIVE

## 2024-04-06 LAB — CHLAMYDIA/NGC RT PCR (ARMC ONLY)
Chlamydia Tr: NOT DETECTED
N gonorrhoeae: NOT DETECTED

## 2024-04-06 LAB — HEPARIN LEVEL (UNFRACTIONATED)
Heparin Unfractionated: 0.42 [IU]/mL (ref 0.30–0.70)
Heparin Unfractionated: 0.28 [IU]/mL — ABNORMAL LOW (ref 0.30–0.70)
Heparin Unfractionated: 0.59 [IU]/mL (ref 0.30–0.70)

## 2024-04-06 LAB — HEMOGLOBIN: Hemoglobin: 9.2 g/dL — ABNORMAL LOW (ref 12.0–15.0)

## 2024-04-06 LAB — HIV ANTIBODY (ROUTINE TESTING W REFLEX): HIV Screen 4th Generation wRfx: NONREACTIVE

## 2024-04-06 SURGERY — VISCERAL ANGIOGRAPHY
Anesthesia: Moderate Sedation

## 2024-04-06 MED ORDER — SODIUM CHLORIDE 0.9 % IV SOLN: INTRAVENOUS | Status: DC

## 2024-04-06 MED ORDER — MIDAZOLAM HCL 2 MG/2ML IJ SOLN
INTRAMUSCULAR | Status: DC | PRN
  Administered 2024-04-06: 1 mg via INTRAVENOUS
  Administered 2024-04-06 (×2): .5 mg via INTRAVENOUS

## 2024-04-06 MED ORDER — METOPROLOL SUCCINATE ER 50 MG PO TB24
50.0000 mg | ORAL_TABLET | Freq: Every day | ORAL | Status: DC
  Administered 2024-04-06 – 2024-04-10 (×5): 50 mg via ORAL
  Filled 2024-04-06 (×5): qty 1

## 2024-04-06 MED ORDER — FENTANYL CITRATE (PF) 100 MCG/2ML IJ SOLN
INTRAMUSCULAR | Status: DC | PRN
  Administered 2024-04-06 (×2): 25 ug via INTRAVENOUS
  Administered 2024-04-06: 50 ug via INTRAVENOUS

## 2024-04-06 MED ORDER — FENTANYL CITRATE PF 50 MCG/ML IJ SOSY
PREFILLED_SYRINGE | INTRAMUSCULAR | Status: AC
  Filled 2024-04-06: qty 1

## 2024-04-06 MED ORDER — IOHEXOL 350 MG/ML SOLN
100.0000 mL | Freq: Once | INTRAVENOUS | Status: AC | PRN
  Administered 2024-04-06: 100 mL via INTRAVENOUS

## 2024-04-06 MED ORDER — HEPARIN (PORCINE) 25000 UT/250ML-% IV SOLN
650.0000 [IU]/h | INTRAVENOUS | Status: DC
  Administered 2024-04-06: 600 [IU]/h via INTRAVENOUS
  Filled 2024-04-06 (×2): qty 250

## 2024-04-06 MED ORDER — EZETIMIBE 10 MG PO TABS
10.0000 mg | ORAL_TABLET | Freq: Every day | ORAL | Status: DC
  Administered 2024-04-06 – 2024-04-10 (×5): 10 mg via ORAL
  Filled 2024-04-06 (×5): qty 1

## 2024-04-06 MED ORDER — HEPARIN SODIUM (PORCINE) 10000 UNIT/ML IJ SOLN
INTRAMUSCULAR | Status: DC | PRN
  Administered 2024-04-06: 4000 [IU]

## 2024-04-06 MED ORDER — ASPIRIN 81 MG PO TBEC
81.0000 mg | DELAYED_RELEASE_TABLET | Freq: Every day | ORAL | Status: DC
  Administered 2024-04-06 – 2024-04-10 (×5): 81 mg via ORAL
  Filled 2024-04-06 (×5): qty 1

## 2024-04-06 MED ORDER — FENTANYL CITRATE (PF) 100 MCG/2ML IJ SOLN
INTRAMUSCULAR | Status: AC
  Filled 2024-04-06: qty 2

## 2024-04-06 MED ORDER — ACETAMINOPHEN 325 MG PO TABS
650.0000 mg | ORAL_TABLET | Freq: Four times a day (QID) | ORAL | Status: DC | PRN
Start: 2024-04-06 — End: 2024-04-09

## 2024-04-06 MED ORDER — MIDAZOLAM HCL 2 MG/ML PO SYRP: 8.0000 mg | ORAL_SOLUTION | Freq: Once | ORAL | Status: DC | PRN

## 2024-04-06 MED ORDER — MORPHINE SULFATE (PF) 2 MG/ML IV SOLN
2.0000 mg | INTRAVENOUS | Status: DC | PRN
  Administered 2024-04-07: 2 mg via INTRAVENOUS
  Filled 2024-04-06: qty 1

## 2024-04-06 MED ORDER — SODIUM CHLORIDE 0.9% FLUSH
10.0000 mL | Freq: Two times a day (BID) | INTRAVENOUS | Status: DC
  Administered 2024-04-06 – 2024-04-10 (×8): 10 mL

## 2024-04-06 MED ORDER — METHYLPREDNISOLONE SODIUM SUCC 125 MG IJ SOLR: 125.0000 mg | Freq: Once | INTRAMUSCULAR | Status: DC | PRN

## 2024-04-06 MED ORDER — ALBUTEROL SULFATE (2.5 MG/3ML) 0.083% IN NEBU: 2.5000 mg | INHALATION_SOLUTION | RESPIRATORY_TRACT | Status: DC | PRN

## 2024-04-06 MED ORDER — ACETAMINOPHEN 650 MG RE SUPP: 650.0000 mg | Freq: Four times a day (QID) | RECTAL | Status: DC | PRN

## 2024-04-06 MED ORDER — ONDANSETRON HCL 4 MG/2ML IJ SOLN
4.0000 mg | Freq: Four times a day (QID) | INTRAMUSCULAR | Status: DC | PRN
  Administered 2024-04-06 – 2024-04-07 (×2): 4 mg via INTRAVENOUS
  Filled 2024-04-06 (×2): qty 2

## 2024-04-06 MED ORDER — NICOTINE 14 MG/24HR TD PT24
14.0000 mg | MEDICATED_PATCH | Freq: Every day | TRANSDERMAL | Status: DC
  Administered 2024-04-06 – 2024-04-10 (×5): 14 mg via TRANSDERMAL
  Filled 2024-04-06 (×5): qty 1

## 2024-04-06 MED ORDER — MIDAZOLAM HCL 2 MG/2ML IJ SOLN
INTRAMUSCULAR | Status: AC
  Filled 2024-04-06: qty 2

## 2024-04-06 MED ORDER — CEFAZOLIN SODIUM-DEXTROSE 2-4 GM/100ML-% IV SOLN
2.0000 g | INTRAVENOUS | Status: DC
  Administered 2024-04-06: 2 g via INTRAVENOUS

## 2024-04-06 MED ORDER — HYDROCODONE-ACETAMINOPHEN 5-325 MG PO TABS: 1.0000 | ORAL_TABLET | ORAL | Status: DC | PRN

## 2024-04-06 MED ORDER — ONDANSETRON HCL 4 MG/2ML IJ SOLN
4.0000 mg | Freq: Once | INTRAMUSCULAR | Status: AC
  Administered 2024-04-06: 4 mg via INTRAVENOUS
  Filled 2024-04-06: qty 2

## 2024-04-06 MED ORDER — HEPARIN (PORCINE) IN NACL 2000-0.9 UNIT/L-% IV SOLN
INTRAVENOUS | Status: DC | PRN
Start: 2024-04-06 — End: 2024-04-06
  Administered 2024-04-06: 1000 mL

## 2024-04-06 MED ORDER — ATORVASTATIN CALCIUM 20 MG PO TABS
80.0000 mg | ORAL_TABLET | Freq: Every day | ORAL | Status: DC
  Administered 2024-04-06 – 2024-04-10 (×5): 80 mg via ORAL
  Filled 2024-04-06 (×5): qty 4

## 2024-04-06 MED ORDER — SODIUM CHLORIDE 0.9% FLUSH: 10.0000 mL | INTRAVENOUS | Status: DC | PRN

## 2024-04-06 MED ORDER — FLUTICASONE FUROATE-VILANTEROL 100-25 MCG/ACT IN AEPB
1.0000 | INHALATION_SPRAY | Freq: Every day | RESPIRATORY_TRACT | Status: DC
  Administered 2024-04-07 – 2024-04-10 (×4): 1 via RESPIRATORY_TRACT
  Filled 2024-04-06: qty 28

## 2024-04-06 MED ORDER — DIPHENHYDRAMINE HCL 50 MG/ML IJ SOLN: 50.0000 mg | Freq: Once | INTRAMUSCULAR | Status: DC | PRN

## 2024-04-06 MED ORDER — FAMOTIDINE 20 MG PO TABS: 40.0000 mg | ORAL_TABLET | Freq: Once | ORAL | Status: DC | PRN

## 2024-04-06 MED ORDER — METRONIDAZOLE 500 MG PO TABS
2000.0000 mg | ORAL_TABLET | Freq: Once | ORAL | Status: AC
  Administered 2024-04-06: 2000 mg via ORAL
  Filled 2024-04-06: qty 4

## 2024-04-06 MED ORDER — HEPARIN BOLUS VIA INFUSION
3050.0000 [IU] | Freq: Once | INTRAVENOUS | Status: AC
  Administered 2024-04-06: 3050 [IU] via INTRAVENOUS
  Filled 2024-04-06: qty 3050

## 2024-04-06 MED ORDER — LOSARTAN POTASSIUM 50 MG PO TABS
100.0000 mg | ORAL_TABLET | Freq: Every day | ORAL | Status: DC
  Administered 2024-04-06 – 2024-04-10 (×5): 100 mg via ORAL
  Filled 2024-04-06 (×5): qty 2

## 2024-04-06 MED ORDER — MORPHINE SULFATE (PF) 4 MG/ML IV SOLN
4.0000 mg | Freq: Once | INTRAVENOUS | Status: AC
  Administered 2024-04-06: 4 mg via INTRAVENOUS
  Filled 2024-04-06: qty 1

## 2024-04-06 MED ORDER — FERROUS SULFATE 325 (65 FE) MG PO TABS
325.0000 mg | ORAL_TABLET | Freq: Every day | ORAL | Status: DC
  Administered 2024-04-06 – 2024-04-10 (×5): 325 mg via ORAL
  Filled 2024-04-06 (×5): qty 1

## 2024-04-06 MED ORDER — IODIXANOL 320 MG/ML IV SOLN
INTRAVENOUS | Status: DC | PRN
  Administered 2024-04-06: 85 mL via INTRA_ARTERIAL

## 2024-04-06 MED ORDER — CLOPIDOGREL BISULFATE 75 MG PO TABS
75.0000 mg | ORAL_TABLET | Freq: Every day | ORAL | Status: DC
  Administered 2024-04-06 – 2024-04-10 (×5): 75 mg via ORAL
  Filled 2024-04-06 (×5): qty 1

## 2024-04-06 MED ORDER — SODIUM CHLORIDE 0.9 % IV BOLUS (SEPSIS)
1000.0000 mL | Freq: Once | INTRAVENOUS | Status: AC
  Administered 2024-04-06: 1000 mL via INTRAVENOUS

## 2024-04-06 MED ORDER — LIDOCAINE-EPINEPHRINE (PF) 1 %-1:200000 IJ SOLN
INTRAMUSCULAR | Status: DC | PRN
  Administered 2024-04-06: 10 mL

## 2024-04-06 MED ORDER — HEPARIN SODIUM (PORCINE) 1000 UNIT/ML IJ SOLN
INTRAMUSCULAR | Status: AC
Start: 2024-04-06 — End: 2024-04-06
  Filled 2024-04-06: qty 10

## 2024-04-06 MED ORDER — ONDANSETRON HCL 4 MG PO TABS: 4.0000 mg | ORAL_TABLET | Freq: Four times a day (QID) | ORAL | Status: DC | PRN

## 2024-04-06 SURGICAL SUPPLY — 17 items
BALLOON LUTONIX DCB 4X40X130 (BALLOONS) IMPLANT
CATH ANGIO 5F PIGTAIL 65CM (CATHETERS) IMPLANT
CATH VS15FR (CATHETERS) IMPLANT
COVER PROBE ULTRASOUND 5X96 (MISCELLANEOUS) IMPLANT
DEVICE PRESTO INFLATION (MISCELLANEOUS) IMPLANT
DEVICE STARCLOSE SE CLOSURE (Vascular Products) IMPLANT
DEVICE TORQUE (MISCELLANEOUS) IMPLANT
GLIDECATH 4FR STR (CATHETERS) IMPLANT
GLIDEWIRE STIFF .35X180X3 HYDR (WIRE) IMPLANT
PACK ANGIOGRAPHY (CUSTOM PROCEDURE TRAY) ×1 IMPLANT
SHEATH ANL2 6FRX45 HC (SHEATH) IMPLANT
SHEATH BRITE TIP 6FRX11 (SHEATH) IMPLANT
STENT LIFESTREAM 6X37X80 (Permanent Stent) IMPLANT
SYR MEDRAD MARK 7 150ML (SYRINGE) IMPLANT
TUBING CONTRAST HIGH PRESS 72 (TUBING) IMPLANT
WIRE J 3MM .035X145CM (WIRE) IMPLANT
WIRE SUPRACORE 190CM (WIRE) IMPLANT

## 2024-04-06 NOTE — Progress Notes (Signed)
 Nonbillable note Patient with a history of PAD, chronic mesenteric ischemia who presents to the ER for evaluation of worsening abdominal pain.  CT angiogram of the chest, abdomen and pelvis showed occlusion of the superior mesenteric artery and severe narrowing of the origin of the celiac axis and inferior mesenteric artery.  Appreciate vascular surgery input, patient will be taken to the vascular lab for visceral angiogram with possible intervention.  Continue heparin  drip. Smoking cessation has been discussed with patient in detail.  She declines a nicotine  patch at this time.

## 2024-04-06 NOTE — Assessment & Plan Note (Signed)
 Patient increasingly symptomatic, unable to eat Continue heparin  infusion IV pain control Consult vascular Will keep n.p.o. in case of procedure

## 2024-04-06 NOTE — Assessment & Plan Note (Addendum)
 S/p right upper lobe posterior segmentectomy (March 20, 2023) Followed at [facility]

## 2024-04-06 NOTE — ED Notes (Signed)
 This RN attempted to stick pt twice for IV access and to redraw heparin  level as lab called to say the tube sent hemolyzed. This RN was unsuccessful. Another RN and Medic stuck pt several times as well with no success.

## 2024-04-06 NOTE — Progress Notes (Signed)
 PHARMACY - ANTICOAGULATION CONSULT NOTE  Pharmacy Consult for Heparin   Indication: chronic mesenteric ischemia   Allergies  Allergen Reactions   Aspirin  Nausea And Vomiting and Other (See Comments)    Pt states aspirin  makes her cramp and have to use coated kind.   Zyrtec [Cetirizine] Other (See Comments)    Upset stomach   Tramadol Rash    Patient Measurements: Height: 5' 4 (162.6 cm) Weight: 49.6 kg (109 lb 5.6 oz) IBW/kg (Calculated) : 54.7 HEPARIN  DW (KG): 49.6  Vital Signs: Temp: 99.2 F (37.3 C) (08/04 1915) Temp Source: Oral (08/04 1915) BP: 134/75 (08/04 1915) Pulse Rate: 59 (08/04 1915)  Labs: Recent Labs    04/05/24 2053 04/06/24 9367 04/06/24 1201  HGB 11.2*  --  8.9*  HCT 34.0*  --  27.8*  PLT 301  --  224  HEPARINUNFRC  --  0.42 0.28*  CREATININE 1.20*  --  1.08*    Estimated Creatinine Clearance: 39 mL/min (A) (by C-G formula based on SCr of 1.08 mg/dL (H)).   Medical History: Past Medical History:  Diagnosis Date   Adenocarcinoma of upper lobe of right lung (HCC)    a.) s/p posterior segmentectomy on 03/20/23 at [facility]   Anemia    Arthritis    Asthma    Carotid artery stenosis    a.) doppler 05/14/2017: 1-39% BICA; b.) doppler 04/05/2018: 50-69% BICA; c.) doppler 06/12/2019: 40-59% BICA; d.) doppler 06/14/2020, 06/14/2021: 1-39% BICA; e.) doppler 06/19/2022: 1.39% RICA, 40-59% LICA   Colon polyp    COPD (chronic obstructive pulmonary disease) (HCC)    Coronary artery disease 04/03/2006   a.) LHC 04/03/2006: 25% mLM, 25% mLAD, 50% D1, 25/50% mLCx, 50% dLCx, 25% OM2, 100% pRCA --> med mgmt; b.) LHC 04/25/2010: 25% pLM, 25% pLAD, 50% pLCx, 70% dLCx, 100% pRCA --> med mgmt   Depression    Diastolic dysfunction    a.) TTE 04/05/2018: EF 65%, no RWMAs, G1DD, triv MR/TR; b.) TTE 10/15/2022: EF 88%, mod LVH, G1DD, triv TR, mild MR; c.) TTE 02/27/2023: EF 65%, no RWMAs, triv TR/MR, RVSP 35; d.) TTE 03/21/2023: EF 65%, no RWMAs, norm RVSF, PASP 44    Environmental allergies    Headache    Hyperlipidemia    Hypertension    Hypoxic respiratory failure (HCC)    Internal hemorrhoids    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[facility])    a.) thinks it was in 2007 --> LHC 04/03/2006 revealed a 100% pRCA --> medically managed   Necrotizing pneumonia (HCC)    Pulmonary HTN (HCC)    a.) TTE 02/27/2023: RVSP 35 mmHg; b.) TTE 03/21/2023: PASP 44 mmHg   PVD (peripheral vascular disease) with claudication (HCC)    Shortness of breath dyspnea    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   Unknown   albuterol  (VENTOLIN  HFA) 108 (90 Base) MCG/ACT inhaler Inhale 2 puffs into the lungs every 4 (four) hours as needed for wheezing or shortness of breath. 9 g 1  Unknown   aspirin  EC 81 MG EC tablet Take 1 tablet (81 mg total) by mouth daily. 90 tablet 1 Past Week   atorvastatin  (LIPITOR ) 80 MG tablet Take 1 tablet (80 mg total) by mouth daily. 90 tablet 3 Past Week   budesonide -formoterol  (SYMBICORT ) 80-4.5 MCG/ACT inhaler INHALE 2 PUFFS ONCE DAILY 22 g 2 Past Week   cephALEXin  (KEFLEX ) 500 MG capsule Take 1 capsule (500 mg total) by mouth 2 (two) times daily for 7 days. 14 capsule 0 Past Week   Cholecalciferol  (VITAMIN D -3 PO) Take 1 tablet by mouth daily.   Past Week   cyclobenzaprine  (FLEXERIL ) 5 MG tablet Take 1 tablet (5 mg total) by mouth at bedtime as needed for muscle spasms. 30 tablet 3 Unknown   ezetimibe  (ZETIA ) 10 MG tablet Take 1 tablet (10 mg total) by mouth daily. 90 tablet 3 Past Week   ferrous sulfate  325 (65 FE) MG  tablet Take 325 mg by mouth daily.   Past Week   gabapentin  (NEURONTIN ) 100 MG capsule Take 1 capsule (100 mg total) by mouth 3 (three) times daily. 90 capsule 0 Past Week   losartan  (COZAAR ) 50 MG tablet Take 2 tablets (100 mg total) by mouth daily. 180 tablet 3 Past Week   metoprolol  succinate (TOPROL -XL) 50 MG 24 hr tablet TAKE 1 TABLET BY MOUTH ONCE DAILY WITH  OR  IMMEDIATELY  FOLLOWING  A  MEAL 90 tablet 0 Past Week   ondansetron  (ZOFRAN -ODT) 4 MG disintegrating tablet Take 1 tablet (4 mg total) by mouth every 8 (eight) hours as needed for nausea or vomiting. 12 tablet 0 Past Week   thiamine  (VITAMIN B-1) 100 MG tablet Take 1 tablet (100 mg total) by mouth daily. 15 tablet 0 Past Week   nicotine  (NICODERM CQ  - DOSED IN MG/24 HOURS) 14 mg/24hr patch Place 1 patch (14 mg total) onto the skin daily. (Patient not taking: Reported on 01/17/2024) 28 patch 0     Assessment: Pharmacy consulted to dose heparin  in this 68 year old female admitted with chronic mesenteric ischemia.  No prior anticoag noted. Hgb trending down, possibly hemodilutional.    CTA:  Severe narrowing at the origin of the celiac axis and inferior mesenteric artery. 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 Mucosal hyper-enhancement of the small bowel, possibly related to reperfusion in the setting of chronic mesenteric ischemia.  8/4 0632 HL 0.42 - drawn 3 hours after it was started  8/4 1201 HL 0.28 8/4 2220 Lower Goal Range per Dr. Cleatus d/t S&S of some rectal bleeding 8/4 2233 HL 0.59, supratherapeutic / Hgb 9.2   Goal of Therapy:  Heparin  level 0.3-0.5 units/ml Monitor platelets by anticoagulation protocol: Yes   Plan:  Will decrease the heparin  infusion to 650 units/hr. Recheck heparin  level in 6 hours after rate change. CBC daily while on heparin .   Rankin CANDIE Dills, PharmD, [facility] 04/06/2024 11:47 PM

## 2024-04-06 NOTE — Assessment & Plan Note (Signed)
 Continue heparin  infusion IV pain control Consult vascular Will keep n.p.o. in case of procedure

## 2024-04-06 NOTE — Assessment & Plan Note (Signed)
 Continue home meds

## 2024-04-06 NOTE — Progress Notes (Signed)
 PHARMACY - ANTICOAGULATION CONSULT NOTE  Pharmacy Consult for Heparin   Indication: chronic mesenteric ischemia   Allergies  Allergen Reactions   Aspirin  Nausea And Vomiting and Other (See Comments)    Pt states aspirin  makes her cramp and have to use coated kind.   Zyrtec [Cetirizine] Other (See Comments)    Upset stomach   Tramadol Rash    Patient Measurements: Height: 5' 4 (162.6 cm) IBW/kg (Calculated) : 54.7  Vital Signs: Temp: 97.9 F (36.6 C) (08/04 1110) Temp Source: Oral (08/04 1110) BP: 133/73 (08/04 1100) Pulse Rate: 65 (08/04 1100)  Labs: Recent Labs    04/05/24 2053 04/06/24 0632 04/06/24 1201  HGB 11.2*  --  8.9*  HCT 34.0*  --  27.8*  PLT 301  --  224  HEPARINUNFRC  --  0.42 0.28*  CREATININE 1.20*  --  1.08*    Estimated Creatinine Clearance: 40 mL/min (A) (by C-G formula based on SCr of 1.08 mg/dL (H)).   Medical History: Past Medical History:  Diagnosis Date   Adenocarcinoma of upper lobe of right lung (HCC)    a.) s/p posterior segmentectomy on 03/20/23 at [facility]   Anemia    Arthritis    Asthma    Carotid artery stenosis    a.) doppler 05/14/2017: 1-39% BICA; b.) doppler 04/05/2018: 50-69% BICA; c.) doppler 06/12/2019: 40-59% BICA; d.) doppler 06/14/2020, 06/14/2021: 1-39% BICA; e.) doppler 06/19/2022: 1.39% RICA, 40-59% LICA   Colon polyp    COPD (chronic obstructive pulmonary disease) (HCC)    Coronary artery disease 04/03/2006   a.) LHC 04/03/2006: 25% mLM, 25% mLAD, 50% D1, 25/50% mLCx, 50% dLCx, 25% OM2, 100% pRCA --> med mgmt; b.) LHC 04/25/2010: 25% pLM, 25% pLAD, 50% pLCx, 70% dLCx, 100% pRCA --> med mgmt   Depression    Diastolic dysfunction    a.) TTE 04/05/2018: EF 65%, no RWMAs, G1DD, triv MR/TR; b.) TTE 10/15/2022: EF 88%, mod LVH, G1DD, triv TR, mild MR; c.) TTE 02/27/2023: EF 65%, no RWMAs, triv TR/MR, RVSP 35; d.) TTE 03/21/2023: EF 65%, no RWMAs, norm RVSF, PASP 44   Environmental allergies    Headache    Hyperlipidemia     Hypertension    Hypoxic respiratory failure (HCC)    Internal hemorrhoids    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[facility])    a.) thinks it was in 2007 --> LHC 04/03/2006 revealed a 100% pRCA --> medically managed   Necrotizing pneumonia (HCC)    Pulmonary HTN (HCC)    a.) TTE 02/27/2023: RVSP 35 mmHg; b.) TTE 03/21/2023: PASP 44 mmHg   PVD (peripheral vascular disease) with claudication (HCC)    Shortness of breath dyspnea    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  No prior anticoag noted. Hgb trending down, possibly hemodilutional.    CTA:  Severe narrowing at the origin of the celiac axis and inferior mesenteric artery. 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 Mucosal hyper-enhancement of the small bowel, possibly related to reperfusion in the setting of  chronic mesenteric ischemia.  8/4 0632 HL 0.42 - drawn 3 hours after it was started  8/4 1201 HL 0.28.   Goal of Therapy:  Heparin  level 0.3-0.7 units/ml Monitor platelets by anticoagulation protocol: Yes   Plan:  Heparin  level is subtherapeutic. Will increase the heparin  infusion to 700 units/hr. Recheck heparin  level in 6 hours. CBC daily while on heparin .   Cathaleen Blanch, PharmD, BCPS Clinical Pharmacist 04/06/2024

## 2024-04-06 NOTE — ED Notes (Signed)
 Fall bundle in place at this time.

## 2024-04-06 NOTE — Assessment & Plan Note (Signed)
 Continue home losartan  and metoprolol  Hydralazine IV as needed for additional control

## 2024-04-06 NOTE — ED Provider Notes (Signed)
 [facility] Provider Note    Event Date/Time   First MD Initiated Contact with Patient 04/06/24 0007     (approximate)   History   Abdominal Pain   HPI  [name] is a 68 y.o. female brought in by EMS with history of A-fib, CAD, carotid artery stenosis, diastolic heart failure, pulmonary hypertension, lung cancer who presents to the emergency department with complaints of periumbilical, suprapubic abdominal pain worse after eating ongoing for the past few weeks.  This is her third visit to the emergency department for the same.  Prior CTs of the abdomen pelvis showed extensive atherosclerotic calcification.  Otherwise no acute abnormality.  She has had previous cholecystectomy, partial hysterectomy, ventral hernia repair.  She does report having brown vaginal discharge 2 weeks ago that has resolved.  Was recently diagnosed with a UTI during her visit on 04/03/2024 and discharged home on Keflex .  States I have not eaten a good meal in a month.  Has an appointment with her PCP on August 11.  Denies chest pain, shortness of breath.  States she is on aspirin  but no other antiplatelet or anticoagulant.   History provided by patient.    Past Medical History:  Diagnosis Date   Adenocarcinoma of upper lobe of right lung (HCC)    a.) s/p posterior segmentectomy on 03/20/23 at [facility]   Anemia    Arthritis    Asthma    Carotid artery stenosis    a.) doppler 05/14/2017: 1-39% BICA; b.) doppler 04/05/2018: 50-69% BICA; c.) doppler 06/12/2019: 40-59% BICA; d.) doppler 06/14/2020, 06/14/2021: 1-39% BICA; e.) doppler 06/19/2022: 1.39% RICA, 40-59% LICA   Colon polyp    COPD (chronic obstructive pulmonary disease) (HCC)    Coronary artery disease 04/03/2006   a.) LHC 04/03/2006: 25% mLM, 25% mLAD, 50% D1, 25/50% mLCx, 50% dLCx, 25% OM2, 100% pRCA --> med mgmt; b.) LHC 04/25/2010: 25% pLM, 25% pLAD, 50% pLCx, 70% dLCx, 100% pRCA --> med mgmt   Depression    Diastolic  dysfunction    a.) TTE 04/05/2018: EF 65%, no RWMAs, G1DD, triv MR/TR; b.) TTE 10/15/2022: EF 88%, mod LVH, G1DD, triv TR, mild MR; c.) TTE 02/27/2023: EF 65%, no RWMAs, triv TR/MR, RVSP 35; d.) TTE 03/21/2023: EF 65%, no RWMAs, norm RVSF, PASP 44   Environmental allergies    Headache    Hyperlipidemia    Hypertension    Hypoxic respiratory failure (HCC)    Internal hemorrhoids    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[facility])    a.) thinks it was in 2007 --> LHC 04/03/2006 revealed a 100% pRCA --> medically managed   Necrotizing pneumonia (HCC)    Pulmonary HTN (HCC)    a.) TTE 02/27/2023: RVSP 35 mmHg; b.) TTE 03/21/2023: PASP 44 mmHg   PVD (peripheral vascular disease) with claudication (HCC)    Shortness of breath dyspnea    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    Substance abuse (HCC)    Substance induced mood disorder (HCC)    Suicidal ideation    Thrombocytopenia (HCC)    Tobacco abuse    Unstable angina (HCC)    Ventral hernia    Vitamin D  deficiency     Past Surgical History:  Procedure Laterality Date   ABDOMINAL HYSTERECTOMY     APPENDECTOMY     CARDIAC CATHETERIZATION     CARDIAC SURGERY     CHOLECYSTECTOMY     COLONOSCOPY  12/15/2011   OH->bleeding internal hemorrhoids, otherwise normal  COLONOSCOPY WITH PROPOFOL  N/A 04/12/2015   Procedure: COLONOSCOPY WITH PROPOFOL ;  Surgeon: Rogelia Copping, MD;  Location: [facility] ENDOSCOPY;  Service: Endoscopy;  Laterality: N/A;   COLONOSCOPY WITH PROPOFOL  N/A 01/05/2020   Procedure: COLONOSCOPY WITH PROPOFOL ;  Surgeon: Copping Rogelia, MD;  Location: [facility] ENDOSCOPY;  Service: Endoscopy;  Laterality: N/A;   ENDARTERECTOMY FEMORAL Left 04/26/2015   Procedure: ENDARTERECTOMY FEMORAL;  Surgeon: Selinda GORMAN Gu, MD;  Location: [facility] ORS;  Service: Vascular;  Laterality: Left;   ENDOVASCULAR STENT INSERTION Bilateral    Legs   FLEXIBLE BRONCHOSCOPY N/A 02/02/2015   Procedure: FLEXIBLE BRONCHOSCOPY;  Surgeon: Elfreda DELENA Bathe, MD;  Location: [facility] ORS;   Service: Pulmonary;  Laterality: N/A;   HEMORRHOID SURGERY     lung mass removed     PERIPHERAL VASCULAR CATHETERIZATION Left 04/25/2015   Procedure: Lower Extremity Angiography;  Surgeon: Selinda GORMAN Gu, MD;  Location: [facility] IN[facility];  Service: Cardiovascular;  Laterality: Left;   PERIPHERAL VASCULAR CATHETERIZATION Left 04/25/2015   Procedure: Lower Extremity Intervention;  Surgeon: Selinda GORMAN Gu, MD;  Location: [facility];  Service: Cardiovascular;  Laterality: Left;   PERIPHERAL VASCULAR CATHETERIZATION Left 01/02/2016   Procedure: Lower Extremity Angiography;  Surgeon: Selinda GORMAN Gu, MD;  Location: [facility];  Service: Cardiovascular;  Laterality: Left;   PERIPHERAL VASCULAR CATHETERIZATION  01/02/2016   Procedure: Lower Extremity Intervention;  Surgeon: Selinda GORMAN Gu, MD;  Location: [facility];  Service: Cardiovascular;;   THROMBECTOMY FEMORAL ARTERY Left 04/26/2015   Procedure: THROMBECTOMY FEMORAL ARTERY;  Surgeon: Selinda GORMAN Gu, MD;  Location: [facility] ORS;  Service: Vascular;  Laterality: Left;   VENTRAL HERNIA REPAIR N/A 07/25/2023   Procedure: HERNIA REPAIR VENTRAL ADULT, open;  Surgeon: Desiderio Schanz, MD;  Location: [facility] ORS;  Service: General;  Laterality: N/A;    MEDICATIONS:  Prior to Admission medications   Medication Sig Start Date End Date Taking? Authorizing Provider  acetaminophen  (TYLENOL ) 500 MG tablet Take 2 tablets (1,000 mg total) by mouth every 6 (six) hours as needed for mild pain (pain score 1-3). Patient not taking: Reported on 01/17/2024 07/25/23   Desiderio Schanz, MD  albuterol  (VENTOLIN  HFA) 108 (90 Base) MCG/ACT inhaler Inhale 2 puffs into the lungs every 4 (four) hours as needed for wheezing or shortness of breath. 12/23/23   McDonough, Tinnie POUR, PA-C  aspirin  EC 81 MG EC tablet Take 1 tablet (81 mg total) by mouth daily. 04/29/15   Gu Selinda GORMAN, MD  atorvastatin  (LIPITOR ) 80 MG tablet Take 1 tablet (80 mg total) by mouth daily. 01/13/24    Abernathy, Alyssa, NP  budesonide -formoterol  (SYMBICORT ) 80-4.5 MCG/ACT inhaler INHALE 2 PUFFS ONCE DAILY 12/23/23   McDonough, Lauren K, PA-C  cephALEXin  (KEFLEX ) 500 MG capsule Take 1 capsule (500 mg total) by mouth 2 (two) times daily for 7 days. 04/03/24 04/10/24  Willo Dunnings, MD  Cholecalciferol  (VITAMIN D -3 PO) Take 1 tablet by mouth daily.    [provider]  cyclobenzaprine  (FLEXERIL ) 5 MG tablet Take 1 tablet (5 mg total) by mouth at bedtime as needed for muscle spasms. 01/13/24   Abernathy, Alyssa, NP  ezetimibe  (ZETIA ) 10 MG tablet Take 1 tablet (10 mg total) by mouth daily. 01/13/24   Liana Fish, NP  ferrous sulfate  325 (65 FE) MG tablet Take 325 mg by mouth daily.    [provider]  gabapentin  (NEURONTIN ) 100 MG capsule Take 1 capsule (100 mg total) by mouth 3 (three) times daily. 06/20/23  Victoria Ruts, MD  losartan  (COZAAR ) 50 MG tablet Take 2 tablets (100 mg total) by mouth daily. 01/13/24   Abernathy, Alyssa, NP  metoprolol  succinate (TOPROL -XL) 50 MG 24 hr tablet TAKE 1 TABLET BY MOUTH ONCE DAILY WITH  OR  IMMEDIATELY  FOLLOWING  A  MEAL 01/18/23   Fernand Denyse LABOR, MD  nicotine  (NICODERM CQ  - DOSED IN MG/24 HOURS) 14 mg/24hr patch Place 1 patch (14 mg total) onto the skin daily. Patient not taking: Reported on 01/17/2024 06/21/23   Victoria Ruts, MD  ondansetron  (ZOFRAN -ODT) 4 MG disintegrating tablet Take 1 tablet (4 mg total) by mouth every 8 (eight) hours as needed for nausea or vomiting. 04/03/24   Willo Dunnings, MD  thiamine  (VITAMIN B-1) 100 MG tablet Take 1 tablet (100 mg total) by mouth daily. 06/21/23   Victoria Ruts, MD    Physical Exam   Triage Vital Signs: ED Triage Vitals  Encounter Vitals Group     BP 04/05/24 2052 (!) 189/92     Girls Systolic BP Percentile --      Girls Diastolic BP Percentile --      Boys Systolic BP Percentile --      Boys Diastolic BP Percentile --      Pulse Rate 04/05/24 2052 74     Resp 04/05/24 2052  18     Temp 04/05/24 2052 97.6 F (36.4 C)     Temp Source 04/05/24 2052 Oral     SpO2 04/05/24 2052 99 %     Weight --      Height 04/05/24 2049 5' 4 (1.626 m)     Head Circumference --      Peak Flow --      Pain Score 04/05/24 2049 5     Pain Loc --      Pain Education --      Exclude from Growth Chart --     Most recent vital signs: Vitals:   04/05/24 2052  BP: (!) 189/92  Pulse: 74  Resp: 18  Temp: 97.6 F (36.4 C)  SpO2: 99%    CONSTITUTIONAL: Alert, responds appropriately to questions.  Chronically ill-appearing, appears uncomfortable but nontoxic HEAD: Normocephalic, atraumatic EYES: Conjunctivae clear, pupils appear equal, sclera nonicteric ENT: normal nose; moist mucous membranes NECK: Supple, normal ROM CARD: RRR; S1 and S2 appreciated RESP: Normal chest excursion without splinting or tachypnea; breath sounds clear and equal bilaterally; no wheezes, no rhonchi, no rales, no hypoxia or respiratory distress, speaking full sentences ABD/GI: Non-distended; soft, tender around the umbilicus and suprapubic region, no right lower quadrant tenderness GU: Normal external genitalia.  Patient's vagina appears normal, normal vaginal.  Small amount of thin white vaginal discharge.  No bleeding.  Chaperone present. BACK: The back appears normal EXT: Normal ROM in all joints; no deformity noted, no edema SKIN: Normal color for age and race; warm; no rash on exposed skin NEURO: Moves all extremities equally, normal speech PSYCH: The patient's mood and manner are appropriate.   ED Results / Procedures / Treatments   LABS: (all labs ordered are listed, but only abnormal results are displayed) Labs Reviewed  WET PREP, GENITAL - Abnormal; Notable for the following components:      Result Value   Trich, Wet Prep PRESENT (*)    WBC, Wet Prep HPF POC >=10 (*)    All other components within normal limits  COMPREHENSIVE METABOLIC PANEL WITH GFR - Abnormal; Notable for the  following components:   Potassium 3.3 (*)  Glucose, Bld 104 (*)    Creatinine, Ser 1.20 (*)    GFR, Estimated 49 (*)    All other components within normal limits  CBC - Abnormal; Notable for the following components:   RBC 3.40 (*)    Hemoglobin 11.2 (*)    HCT 34.0 (*)    All other components within normal limits  LACTIC ACID, PLASMA - Abnormal; Notable for the following components:   Lactic Acid, Venous 2.0 (*)    All other components within normal limits  CHLAMYDIA/NGC RT PCR ([facility] ONLY)            LIPASE, BLOOD  URINALYSIS, ROUTINE W REFLEX MICROSCOPIC  URINE DRUG SCREEN, QUALITATIVE ([facility] ONLY)  LACTIC ACID, PLASMA  HIV ANTIBODY (ROUTINE TESTING W REFLEX)  RPR  HEPATITIS PANEL, ACUTE     EKG:  EKG Interpretation Date/Time:  Monday April 06 2024 00:47:20 EDT Ventricular Rate:  69 PR Interval:  102 QRS Duration:  102 QT Interval:  456 QTC Calculation: 489 R Axis:   72  Text Interpretation: Sinus rhythm Short PR interval Repol abnrm suggests ischemia, inferior leads Baseline wander in lead(s) V3 12 Lead; Mason-Likar Confirmed by Neomi Neptune [phone]) on 04/06/2024 12:56:47 AM         RADIOLOGY: My personal review and interpretation of imaging: Findings consistent with chronic mesenteric ischemia.  I have personally reviewed all radiology reports.   CT Angio Abd/Pel W and/or Wo Contrast Result Date: 04/06/2024 EXAM: CTA ABDOMEN AND PELVIS WITH AND WITHOUT CONTRAST 04/06/2024 01:53:13 AM TECHNIQUE: CTA images of the abdomen and pelvis with and without intravenous contrast. Three-dimensional MIP/volume rendered formations were performed. Automated exposure control, iterative reconstruction, and/or weight based adjustment of the mA/kV was utilized to reduce the radiation dose to as low as reasonably achievable. COMPARISON: None available. CLINICAL HISTORY: Mesenteric ischemia, acute; Mesenteric ischemia, chronic. Pt to ed from home via [facility] for abd pain. Just seen Friday  here for same. All vitals WNL. Pt is caox4, in no acute distress and ambulatory on scene fro EMS. FINDINGS: VASCULATURE: Advanced mixed density atherosclerotic plaque in the aorta and its mesenteric, renal, and iliac artery branches. Moderate narrowing of the splenic artery. AORTA: No acute finding. No abdominal aortic aneurysm. No dissection. CELIAC TRUNK: Severe narrowing at the origin of the celiac axis. SUPERIOR MESENTERIC ARTERY: Occlusion of the proximal SMA. Extensive atherosclerotic calcification involving the mid and distal superior mesenteric artery. INFERIOR MESENTERIC ARTERY: Severe narrowing at the origin. RENAL ARTERIES: Severe narrowing of the left renal artery. There is an accessory left renal artery. Moderate narrowing of the right renal artery near the origin. ILIAC ARTERIES: Moderate narrowing of the left external and internal iliac arteries. Severe narrowing of the proximal right internal iliac artery. Moderate narrowing of the right external iliac artery. Probable changes of endarterectomy in the left superficial  femoral artery. LIVER: Patent portal vein. No acute abnormality. GALLBLADDER AND BILE DUCTS: Cholecystectomy. SPLEEN: The spleen is unremarkable. PANCREAS: Pancreatic divisum. ADRENAL GLANDS: Bilateral adrenal glands demonstrate no acute abnormality. KIDNEYS, URETERS AND BLADDER: The bladder is not distended. GI AND BOWEL: No bowel obstruction. Mild distention of the stomach. No bowel wall thickening. Mucosal hyper-enhancement of the small bowel. This can be a normal finding seen with late arterial phase enhancement however given significant narrowing in all 3 mesenteric arteries, hyperemia secondary to reperfusion in the setting of chronic mesenteric ischemia should be considered. REPRODUCTIVE: Hysterectomy. PERITONEUM AND RETRPERITONEUM: No ascites or free air. LUNG BASE: No acute abnormality. LYMPH NODES:  No lymphadenopathy. BONES AND SOFT TISSUES: No acute abnormality of the bones.  No acute soft tissue abnormality. IMPRESSION: 1. Severe narrowing at the origin of the celiac axis and inferior mesenteric artery. 2. 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 3. Mucosal hyper-enhancement of the small bowel, possibly related to reperfusion in the setting of chronic mesenteric ischemia. Electronically signed by: Norman Gatlin MD 04/06/2024 02:21 AM EDT RP Workstation: [id number]     PROCEDURES:  Critical Care performed: Yes, see critical care procedure note(s)   CRITICAL CARE Performed by: Josette Sink   Total critical care time: 30 minutes  Critical care time was exclusive of separately billable procedures and treating other patients.  Critical care was necessary to treat or prevent imminent or life-threatening deterioration.  Critical care was time spent personally by me on the following activities: development of treatment plan with patient and/or surrogate as well as nursing, discussions with consultants, evaluation of patient's response to treatment, examination of patient, obtaining history from patient or surrogate, ordering and performing treatments and interventions, ordering and review of laboratory studies, ordering and review of radiographic studies, pulse oximetry and re-evaluation of patient's condition.   SABRA1-3 Lead EKG Interpretation  Performed by: Ellsie Violette, Josette SAILOR, DO Authorized by: Shekera Beavers, Josette SAILOR, DO     Interpretation: normal     ECG rate:  74   ECG rate assessment: normal     Rhythm: sinus rhythm     Ectopy: none     Conduction: normal       IMPRESSION / MDM / ASSESSMENT AND PLAN / ED COURSE  I reviewed the triage vital signs and the nursing notes.    Patient here with abdominal pain worse after eating.  The patient is on the cardiac monitor to evaluate for evidence of arrhythmia and/or significant heart rate changes.   DIFFERENTIAL DIAGNOSIS  (includes but not limited to):   Acute mesenteric ischemia, chronic mesenteric ischemia, UTI, pelvic infection, kidney stone, doubt appendicitis, colitis, bowel obstruction   Patient's presentation is most consistent with acute presentation with potential threat to life or bodily function.   PLAN: Will obtain CT angio of the abdomen and pelvis given concerning findings on one of her recent CT scans and pain after eating.  Will obtain lactic.  Will send urine and perform pelvic exam with cultures.  Will give pain medicine.  Labs obtained from triage show no leukocytosis.  Stable mild chronic kidney disease.  Normal electrolytes.  MEDICATIONS GIVEN IN ED: Medications  metroNIDAZOLE  (FLAGYL ) tablet 2,000 mg (has no administration in time range)  0.9 %  sodium chloride  infusion (has no administration in time range)  heparin  bolus via infusion 3,050 Units (has no administration in time range)  heparin  ADULT infusion 100 units/mL (25000 units/250mL) (has no administration in time range)  sodium chloride  0.9 % bolus 1,000 mL (1,000 mLs Intravenous New Bag/Given 04/06/24 0130)  morphine  (PF) 4 MG/ML injection 4 mg (4 mg Intravenous Given 04/06/24 0128)  ondansetron  (ZOFRAN ) injection 4 mg (4 mg Intravenous Given 04/06/24 0128)  iohexol  (OMNIPAQUE ) 350 MG/ML injection 100 mL (100 mLs Intravenous Contrast Given 04/06/24 0143)     ED COURSE: Lactic minimally elevated at 2.0.  CT scan reviewed and interpreted by myself and the radiologist and shows findings consistent with chronic mesenteric ischemia with severe narrowing of the origin of the celiac axis and inferior mesenteric artery and occlusion of the proximal superior mesenteric artery with extensive atherosclerotic  calcification involving the mid and distal SMA.  She has mucosal hyperenhancement of the small bowel likely related to reperfusion in the setting of chronic mesenteric ischemia.  Will start heparin .  Patient will need vascular surgery consultation.   Will discuss with hospitalist for admission.  Patient is also positive for trichomonas.  Will give 2 g of Flagyl  now.  Will check HIV, syphilis testing as well.   CONSULTS:  Consulted and discussed patient's case with hospitalist, Dr. Cleatus.  I have recommended admission and consulting physician agrees and will place admission orders.  Patient (and family if present) agree with this plan.   I reviewed all nursing notes, vitals, pertinent previous records.  All labs, EKGs, imaging ordered have been independently reviewed and interpreted by myself.    OUTSIDE RECORDS REVIEWED: Reviewed last general surgery note on 07/25/2023.  Patient had open incarcerated ventral hernia repair with mesh.       FINAL CLINICAL IMPRESSION(S) / ED DIAGNOSES   Final diagnoses:  Periumbilical abdominal pain     Rx / DC Orders   ED Discharge Orders     None        Note:  This document was prepared using Dragon voice recognition software and may include unintentional dictation errors.   Lavanda Nevels, Josette SAILOR, DO 04/06/24 [phone]

## 2024-04-06 NOTE — Assessment & Plan Note (Signed)
Not acutely exacerbated.  DuoNeb as needed 

## 2024-04-06 NOTE — Assessment & Plan Note (Addendum)
 Treated with metronidazole  2000 mg x 1

## 2024-04-06 NOTE — H&P (View-Only) (Signed)
 Hospital Consult    Reason for Consult:  Mesenteric Ischemia  Requesting Physician:  Dr Delayne Solian MD  MRN #:  [id number]  History of Present Illness: This is a 68 y.o. female with medical history significant for Depression, hypertension, hyperlipidemia, COPD, CAD, PVD, right lung cancer s/p surgery, tobacco use disorder being admitted for worsening symptoms of chronic mesenteric ischemia.  She presents by EMS for the third time in a week with ongoing periumbilical abdominal pain radiating to the back, associated with nausea occasional vomiting and blood in her stool .   [name] is known to vascular surgery. She was last seen in January of 2024 for left lower leg ischemia and in October of 2023 for carotid artery disease. Vascular surgery is consulted to evaluate.   Past Medical History:  Diagnosis Date   Adenocarcinoma of upper lobe of right lung (HCC)    a.) s/p posterior segmentectomy on 03/20/23 at [facility]   Anemia    Arthritis    Asthma    Carotid artery stenosis    a.) doppler 05/14/2017: 1-39% BICA; b.) doppler 04/05/2018: 50-69% BICA; c.) doppler 06/12/2019: 40-59% BICA; d.) doppler 06/14/2020, 06/14/2021: 1-39% BICA; e.) doppler 06/19/2022: 1.39% RICA, 40-59% LICA   Colon polyp    COPD (chronic obstructive pulmonary disease) (HCC)    Coronary artery disease 04/03/2006   a.) LHC 04/03/2006: 25% mLM, 25% mLAD, 50% D1, 25/50% mLCx, 50% dLCx, 25% OM2, 100% pRCA --> med mgmt; b.) LHC 04/25/2010: 25% pLM, 25% pLAD, 50% pLCx, 70% dLCx, 100% pRCA --> med mgmt   Depression    Diastolic dysfunction    a.) TTE 04/05/2018: EF 65%, no RWMAs, G1DD, triv MR/TR; b.) TTE 10/15/2022: EF 88%, mod LVH, G1DD, triv TR, mild MR; c.) TTE 02/27/2023: EF 65%, no RWMAs, triv TR/MR, RVSP 35; d.) TTE 03/21/2023: EF 65%, no RWMAs, norm RVSF, PASP 44   Environmental allergies    Headache    Hyperlipidemia    Hypertension    Hypoxic respiratory failure (HCC)    Internal hemorrhoids    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[facility])    a.) thinks it was in 2007 --> LHC 04/03/2006 revealed a 100% pRCA --> medically managed   Necrotizing pneumonia (HCC)    Pulmonary HTN (HCC)    a.) TTE 02/27/2023: RVSP 35 mmHg; b.) TTE 03/21/2023: PASP 44 mmHg   PVD (peripheral vascular disease) with claudication (HCC)    Shortness of breath dyspnea    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    Substance abuse (HCC)    Substance induced mood disorder (HCC)    Suicidal ideation    Thrombocytopenia (HCC)    Tobacco abuse    Unstable angina (HCC)    Ventral hernia    Vitamin D  deficiency     Past Surgical History:  Procedure Laterality Date   ABDOMINAL HYSTERECTOMY     APPENDECTOMY     CARDIAC CATHETERIZATION     CARDIAC SURGERY     CHOLECYSTECTOMY     COLONOSCOPY  12/15/2011   OH->bleeding internal hemorrhoids, otherwise normal   COLONOSCOPY WITH PROPOFOL  N/A 04/12/2015   Procedure: COLONOSCOPY WITH PROPOFOL ;  Surgeon: Rogelia Copping, MD;  Location: [facility] ENDOSCOPY;  Service: Endoscopy;  Laterality: N/A;   COLONOSCOPY WITH PROPOFOL  N/A 01/05/2020   Procedure: COLONOSCOPY WITH PROPOFOL ;  Surgeon: Copping Rogelia, MD;  Location: [facility] ENDOSCOPY;  Service: Endoscopy;  Laterality: N/A;   ENDARTERECTOMY FEMORAL Left 04/26/2015   Procedure: ENDARTERECTOMY FEMORAL;  Surgeon: Selinda GORMAN Gu, MD;  Location:  [facility] ORS;  Service: Vascular;  Laterality: Left;   ENDOVASCULAR STENT INSERTION Bilateral    Legs   FLEXIBLE BRONCHOSCOPY N/A 02/02/2015   Procedure: FLEXIBLE BRONCHOSCOPY;  Surgeon: Elfreda DELENA Bathe, MD;  Location: [facility] ORS;  Service: Pulmonary;  Laterality: N/A;   HEMORRHOID SURGERY     lung mass removed     PERIPHERAL VASCULAR CATHETERIZATION Left 04/25/2015   Procedure: Lower Extremity Angiography;  Surgeon: Selinda GORMAN Gu, MD;  Location: [facility];  Service: Cardiovascular;  Laterality: Left;   PERIPHERAL VASCULAR CATHETERIZATION Left 04/25/2015   Procedure: Lower Extremity Intervention;  Surgeon: Selinda GORMAN Gu,  MD;  Location: [facility];  Service: Cardiovascular;  Laterality: Left;   PERIPHERAL VASCULAR CATHETERIZATION Left 01/02/2016   Procedure: Lower Extremity Angiography;  Surgeon: Selinda GORMAN Gu, MD;  Location: [facility];  Service: Cardiovascular;  Laterality: Left;   PERIPHERAL VASCULAR CATHETERIZATION  01/02/2016   Procedure: Lower Extremity Intervention;  Surgeon: Selinda GORMAN Gu, MD;  Location: [facility];  Service: Cardiovascular;;   THROMBECTOMY FEMORAL ARTERY Left 04/26/2015   Procedure: THROMBECTOMY FEMORAL ARTERY;  Surgeon: Selinda GORMAN Gu, MD;  Location: [facility] ORS;  Service: Vascular;  Laterality: Left;   VENTRAL HERNIA REPAIR N/A 07/25/2023   Procedure: HERNIA REPAIR VENTRAL ADULT, open;  Surgeon: Desiderio Schanz, MD;  Location: [facility] ORS;  Service: General;  Laterality: N/A;    Allergies  Allergen Reactions   Aspirin  Nausea And Vomiting and Other (See Comments)    Pt states aspirin  makes her cramp and have to use coated kind.   Zyrtec [Cetirizine] Other (See Comments)    Upset stomach   Tramadol Rash    Prior to Admission medications   Medication Sig Start Date End Date Taking? Authorizing Provider  acetaminophen  (TYLENOL ) 500 MG tablet Take 2 tablets (1,000 mg total) by mouth every 6 (six) hours as needed for mild pain (pain score 1-3). 07/25/23  Yes Piscoya, Schanz, MD  albuterol  (VENTOLIN  HFA) 108 (90 Base) MCG/ACT inhaler Inhale 2 puffs into the lungs every 4 (four) hours as needed for wheezing or shortness of breath. 12/23/23   McDonough, Tinnie POUR, PA-C  aspirin  EC 81 MG EC tablet Take 1 tablet (81 mg total) by mouth daily. 04/29/15   Gu Selinda GORMAN, MD  atorvastatin  (LIPITOR ) 80 MG tablet Take 1 tablet (80 mg total) by mouth daily. 01/13/24   Abernathy, Alyssa, NP  budesonide -formoterol  (SYMBICORT ) 80-4.5 MCG/ACT inhaler INHALE 2 PUFFS ONCE DAILY 12/23/23   McDonough, Lauren K, PA-C  cephALEXin  (KEFLEX ) 500 MG capsule Take 1 capsule (500 mg total) by mouth 2 (two)  times daily for 7 days. 04/03/24 04/10/24  Willo Dunnings, MD  Cholecalciferol  (VITAMIN D -3 PO) Take 1 tablet by mouth daily.    [provider]  cyclobenzaprine  (FLEXERIL ) 5 MG tablet Take 1 tablet (5 mg total) by mouth at bedtime as needed for muscle spasms. 01/13/24   Abernathy, Alyssa, NP  ezetimibe  (ZETIA ) 10 MG tablet Take 1 tablet (10 mg total) by mouth daily. 01/13/24   Liana Fish, NP  ferrous sulfate  325 (65 FE) MG tablet Take 325 mg by mouth daily.    [provider]  gabapentin  (NEURONTIN ) 100 MG capsule Take 1 capsule (100 mg total) by mouth 3 (three) times daily. 06/20/23   Victoria Ruts, MD  losartan  (COZAAR ) 50 MG tablet Take 2 tablets (100 mg total) by mouth daily. 01/13/24   Abernathy, Alyssa, NP  metoprolol  succinate (TOPROL -XL) 50 MG 24 hr tablet  TAKE 1 TABLET BY MOUTH ONCE DAILY WITH  OR  IMMEDIATELY  FOLLOWING  A  MEAL 01/18/23   Fernand Denyse LABOR, MD  nicotine  (NICODERM CQ  - DOSED IN MG/24 HOURS) 14 mg/24hr patch Place 1 patch (14 mg total) onto the skin daily. Patient not taking: Reported on 01/17/2024 06/21/23   Victoria Ruts, MD  ondansetron  (ZOFRAN -ODT) 4 MG disintegrating tablet Take 1 tablet (4 mg total) by mouth every 8 (eight) hours as needed for nausea or vomiting. 04/03/24   Willo Dunnings, MD  thiamine  (VITAMIN B-1) 100 MG tablet Take 1 tablet (100 mg total) by mouth daily. 06/21/23   Victoria Ruts, MD    Social History   Socioeconomic History   Marital status: Single    Spouse name: Not on file   Number of children: Not on file   Years of education: Not on file   Highest education level: Not on file  Occupational History   Not on file  Tobacco Use   Smoking status: Every Day    Current packs/day: 1.00    Types: Cigarettes    Passive exposure: Past   Smokeless tobacco: Never  Vaping Use   Vaping status: Never Used  Substance and Sexual Activity   Alcohol use: Yes    Alcohol/week: 0.0 standard drinks of alcohol    Comment:  weekends, couple beers, pint of liquer only on weekends.  Last drank approx 1 month ago.   Drug use: No   Sexual activity: Not Currently  Other Topics Concern   Not on file  Social History Narrative   Lives with family    Social Drivers of Health   Financial Resource Strain: Low Risk  (03/21/2023)   [facility]   Overall Financial Resource Strain (CARDIA)    Difficulty of Paying Living Expenses: Not hard at all  Food Insecurity: No Food Insecurity (04/06/2024)   Hunger Vital Sign    Worried About Running Out of Food in the Last Year: Never true    Ran Out of Food in the Last Year: Never true  Transportation Needs: No Transportation Needs (04/06/2024)   PRAPARE - Administrator, Civil Service (Medical): No    Lack of Transportation (Non-Medical): No  Physical Activity: Inactive (04/04/2018)   Exercise Vital Sign    Days of Exercise per Week: 0 days    Minutes of Exercise per Session: 0 min  Stress: No Stress Concern Present (04/04/2018)   [employer] of Occupational Health - Occupational Stress Questionnaire    Feeling of Stress : Only a little  Social Connections: Socially Isolated (04/06/2024)   Social Connection and Isolation Panel    Frequency of Communication with Friends and Family: Once a week    Frequency of Social Gatherings with Friends and Family: Once a week    Attends Religious Services: 1 to 4 times per year    Active Member of [employer] or Organizations: No    Attends Banker Meetings: Never    Marital Status: Never married  Intimate Partner Violence: Not At Risk (04/06/2024)   Humiliation, Afraid, Rape, and Kick questionnaire    Fear of Current or Ex-Partner: No    Emotionally Abused: No    Physically Abused: No    Sexually Abused: No     Family History  Problem Relation Age of Onset   Hypertension Mother    Hypertension Father     ROS: Otherwise negative unless mentioned in HPI  Physical Examination  Vitals:    04/06/24 0649 04/06/24 0700  BP: 114/68 116/69  Pulse: 61   Resp: 14 15  Temp:    SpO2: 98%    Body mass index is 19.22 kg/m.  General:  WDWN in NAD Gait: Not observed HENT: WNL, normocephalic Pulmonary: normal non-labored breathing, without Rales, rhonchi,  wheezing Cardiac: regular, without  Murmurs, rubs or gallops; without carotid bruits Abdomen: Positive bowel sounds, soft, Tenderness noted in the periumbilical area./ Positive distention , no masses Skin: without rashes Vascular Exam/Pulses: Palpable pulses throughout. Al extremities are warm to touch.  Extremities: without ischemic changes, without Gangrene , without cellulitis; without open wounds;  Musculoskeletal: no muscle wasting or atrophy  Neurologic: A&O X 3;  No focal weakness or paresthesias are detected; speech is fluent/normal Psychiatric:  The pt has Normal affect. Lymph:  Unremarkable  CBC    Component Value Date/Time   WBC 6.0 04/05/2024 2053   RBC 3.40 (L) 04/05/2024 2053   HGB 11.2 (L) 04/05/2024 2053   HGB 12.7 05/28/2022 1103   HCT 34.0 (L) 04/05/2024 2053   HCT 36.6 05/28/2022 1103   PLT 301 04/05/2024 2053   PLT 151 05/28/2022 1103   MCV 100.0 04/05/2024 2053   MCV 96 05/28/2022 1103   MCV 88 10/15/2013 1123   MCH 32.9 04/05/2024 2053   MCHC 32.9 04/05/2024 2053   RDW 13.4 04/05/2024 2053   RDW 12.9 05/28/2022 1103   RDW 14.9 (H) 10/15/2013 1123   LYMPHSABS 1.7 05/05/2023 1748   LYMPHSABS 1.5 05/28/2022 1103   LYMPHSABS 0.9 (L) 10/15/2013 1123   MONOABS 0.3 05/05/2023 1748   MONOABS 0.4 10/15/2013 1123   EOSABS 0.1 05/05/2023 1748   EOSABS 0.0 05/28/2022 1103   EOSABS 0.0 10/15/2013 1123   BASOSABS 0.0 05/05/2023 1748   BASOSABS 0.0 05/28/2022 1103   BASOSABS 0.0 10/15/2013 1123    BMET    Component Value Date/Time   NA 137 04/05/2024 2053   NA 140 05/28/2022 1103   NA 131 (L) 10/15/2013 1123   K 3.3 (L) 04/05/2024 2053   K 3.6 10/15/2013 1123   CL 103 04/05/2024 2053   CL  102 10/15/2013 1123   CO2 22 04/05/2024 2053   CO2 24 10/15/2013 1123   GLUCOSE 104 (H) 04/05/2024 2053   GLUCOSE 111 (H) 10/15/2013 1123   BUN 16 04/05/2024 2053   BUN 13 05/28/2022 1103   BUN 12 10/15/2013 1123   CREATININE 1.20 (H) 04/05/2024 2053   CREATININE 1.20 10/15/2013 1123   CALCIUM  10.2 04/05/2024 2053   CALCIUM  9.1 10/15/2013 1123   GFRNONAA 49 (L) 04/05/2024 2053   GFRNONAA 50 (L) 10/15/2013 1123   GFRAA >60 03/02/2020 1232   GFRAA 58 (L) 10/15/2013 1123    COAGS: Lab Results  Component Value Date   INR 1.1 08/05/2021   INR 0.9 12/31/2020   INR 0.94 04/25/2015     Non-Invasive Vascular Imaging:   EXAM: CTA ABDOMEN AND PELVIS WITH AND WITHOUT CONTRAST 04/06/2024 01:53:13 AM   TECHNIQUE: CTA images of the abdomen and pelvis with and without intravenous contrast. Three-dimensional MIP/volume rendered formations were performed. Automated exposure control, iterative reconstruction, and/or weight based adjustment of the mA/kV was utilized to reduce the radiation dose to as low as reasonably achievable.   COMPARISON: None available.   CLINICAL HISTORY: Mesenteric ischemia, acute; Mesenteric ischemia, chronic. Pt to ed from home via [facility] for abd pain. Just seen Friday here for same. All vitals WNL. Pt is caox4, in  no acute distress and ambulatory on scene fro EMS.   FINDINGS:   VASCULATURE: Advanced mixed density atherosclerotic plaque in the aorta and its mesenteric, renal, and iliac artery branches. Moderate narrowing of the splenic artery.   AORTA: No acute finding. No abdominal aortic aneurysm. No dissection.   CELIAC TRUNK: Severe narrowing at the origin of the celiac axis.   SUPERIOR MESENTERIC ARTERY: Occlusion of the proximal SMA. Extensive atherosclerotic calcification involving the mid and distal superior mesenteric artery.   INFERIOR MESENTERIC ARTERY: Severe narrowing at the origin.   RENAL ARTERIES: Severe narrowing of the left  renal artery. There is an accessory left renal artery. Moderate narrowing of the right renal artery near the origin.   ILIAC ARTERIES: Moderate narrowing of the left external and internal iliac arteries. Severe narrowing of the proximal right internal iliac artery. Moderate narrowing of the right external iliac artery. Probable changes of endarterectomy in the left superficial  femoral artery.   LIVER: Patent portal vein. No acute abnormality.   GALLBLADDER AND BILE DUCTS: Cholecystectomy.   SPLEEN: The spleen is unremarkable.   PANCREAS: Pancreatic divisum.   ADRENAL GLANDS: Bilateral adrenal glands demonstrate no acute abnormality.   KIDNEYS, URETERS AND BLADDER: The bladder is not distended.   GI AND BOWEL: No bowel obstruction. Mild distention of the stomach. No bowel wall thickening. Mucosal hyper-enhancement of the small bowel. This can be a normal finding seen with late arterial phase enhancement however given significant narrowing in all 3 mesenteric arteries, hyperemia secondary to reperfusion in the setting of chronic mesenteric ischemia should be considered.   REPRODUCTIVE: Hysterectomy.   PERITONEUM AND RETRPERITONEUM: No ascites or free air.   LUNG BASE: No acute abnormality.   LYMPH NODES: No lymphadenopathy.   BONES AND SOFT TISSUES: No acute abnormality of the bones. No acute soft tissue abnormality.   IMPRESSION: 1. Severe narrowing at the origin of the celiac axis and inferior mesenteric artery. 2. 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 3. Mucosal hyper-enhancement of the small bowel, possibly related to reperfusion in the setting of chronic mesenteric ischemia.    Statin:  Yes.   Beta Blocker:  Yes.   Aspirin :  No. ACEI:  No. ARB:  Yes.   CCB use:  No Other antiplatelets/anticoagulants:  No.    ASSESSMENT/PLAN: This is a 68 y.o. female who  presents to [facility] emergency department due to abdominal pain radiating to her back associated with nausea and occasional vomiting with blood in her stool.  Upon workup patient underwent CTA of the chest abdomen pelvis showing occlusion of the superior mesenteric artery and severe narrowing of the origin of the celiac axis and inferior mesenteric artery.  This most likely explains her severe abdominal pain and inability to eat.  Therefore vascular surgery plans on taking the patient to the vascular lab on 04/06/2024 for a visceral angiogram with possible intervention.  I had a long detailed discussion this morning at the bedside with the patient concerning the procedure, benefits, risk, and complications.  Patient verbalizes her understanding and wishes to proceed.  I answered all the patient's questions this morning.  Patient has been n.p.o. since midnight last night for procedure today.  Patient did present with a lactic acid of 2.0 which is now down to 1.3.  Patient's BUN and creatinine is 16/1.20 patient's hemoglobin is 11.2 and hematocrit is 34.  Patient was started on a heparin  infusion in the emergency department  which is still running.   -I discussed the case in detail with Dr. Selinda Gu MD and he agrees with plan.   [name] [facility] 04/06/2024 7:45 AM

## 2024-04-06 NOTE — ED Notes (Signed)
Pt advocate bedside

## 2024-04-06 NOTE — Consult Note (Signed)
 Hospital Consult    Reason for Consult:  Mesenteric Ischemia  Requesting Physician:  Dr Delayne Solian MD  MRN #:  [id number]  History of Present Illness: This is a 68 y.o. female with medical history significant for Depression, hypertension, hyperlipidemia, COPD, CAD, PVD, right lung cancer s/p surgery, tobacco use disorder being admitted for worsening symptoms of chronic mesenteric ischemia.  She presents by EMS for the third time in a week with ongoing periumbilical abdominal pain radiating to the back, associated with nausea occasional vomiting and blood in her stool .   Ms Demuro is known to vascular surgery. She was last seen in January of 2024 for left lower leg ischemia and in October of 2023 for carotid artery disease. Vascular surgery is consulted to evaluate.   Past Medical History:  Diagnosis Date   Adenocarcinoma of upper lobe of right lung (HCC)    a.) s/p posterior segmentectomy on 03/20/23 at [facility]   Anemia    Arthritis    Asthma    Carotid artery stenosis    a.) doppler 05/14/2017: 1-39% BICA; b.) doppler 04/05/2018: 50-69% BICA; c.) doppler 06/12/2019: 40-59% BICA; d.) doppler 06/14/2020, 06/14/2021: 1-39% BICA; e.) doppler 06/19/2022: 1.39% RICA, 40-59% LICA   Colon polyp    COPD (chronic obstructive pulmonary disease) (HCC)    Coronary artery disease 04/03/2006   a.) LHC 04/03/2006: 25% mLM, 25% mLAD, 50% D1, 25/50% mLCx, 50% dLCx, 25% OM2, 100% pRCA --> med mgmt; b.) LHC 04/25/2010: 25% pLM, 25% pLAD, 50% pLCx, 70% dLCx, 100% pRCA --> med mgmt   Depression    Diastolic dysfunction    a.) TTE 04/05/2018: EF 65%, no RWMAs, G1DD, triv MR/TR; b.) TTE 10/15/2022: EF 88%, mod LVH, G1DD, triv TR, mild MR; c.) TTE 02/27/2023: EF 65%, no RWMAs, triv TR/MR, RVSP 35; d.) TTE 03/21/2023: EF 65%, no RWMAs, norm RVSF, PASP 44   Environmental allergies    Headache    Hyperlipidemia    Hypertension    Hypoxic respiratory failure (HCC)    Internal hemorrhoids    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[facility])    a.) thinks it was in 2007 --> LHC 04/03/2006 revealed a 100% pRCA --> medically managed   Necrotizing pneumonia (HCC)    Pulmonary HTN (HCC)    a.) TTE 02/27/2023: RVSP 35 mmHg; b.) TTE 03/21/2023: PASP 44 mmHg   PVD (peripheral vascular disease) with claudication (HCC)    Shortness of breath dyspnea    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    Substance abuse (HCC)    Substance induced mood disorder (HCC)    Suicidal ideation    Thrombocytopenia (HCC)    Tobacco abuse    Unstable angina (HCC)    Ventral hernia    Vitamin D  deficiency     Past Surgical History:  Procedure Laterality Date   ABDOMINAL HYSTERECTOMY     APPENDECTOMY     CARDIAC CATHETERIZATION     CARDIAC SURGERY     CHOLECYSTECTOMY     COLONOSCOPY  12/15/2011   OH->bleeding internal hemorrhoids, otherwise normal   COLONOSCOPY WITH PROPOFOL  N/A 04/12/2015   Procedure: COLONOSCOPY WITH PROPOFOL ;  Surgeon: Rogelia Copping, MD;  Location: [facility] ENDOSCOPY;  Service: Endoscopy;  Laterality: N/A;   COLONOSCOPY WITH PROPOFOL  N/A 01/05/2020   Procedure: COLONOSCOPY WITH PROPOFOL ;  Surgeon: Copping Rogelia, MD;  Location: [facility] ENDOSCOPY;  Service: Endoscopy;  Laterality: N/A;   ENDARTERECTOMY FEMORAL Left 04/26/2015   Procedure: ENDARTERECTOMY FEMORAL;  Surgeon: Selinda GORMAN Gu, MD;  Location:  [facility] ORS;  Service: Vascular;  Laterality: Left;   ENDOVASCULAR STENT INSERTION Bilateral    Legs   FLEXIBLE BRONCHOSCOPY N/A 02/02/2015   Procedure: FLEXIBLE BRONCHOSCOPY;  Surgeon: Elfreda DELENA Bathe, MD;  Location: [facility] ORS;  Service: Pulmonary;  Laterality: N/A;   HEMORRHOID SURGERY     lung mass removed     PERIPHERAL VASCULAR CATHETERIZATION Left 04/25/2015   Procedure: Lower Extremity Angiography;  Surgeon: Selinda GORMAN Gu, MD;  Location: [facility] INVASIVE CV LAB;  Service: Cardiovascular;  Laterality: Left;   PERIPHERAL VASCULAR CATHETERIZATION Left 04/25/2015   Procedure: Lower Extremity Intervention;  Surgeon: Selinda GORMAN Gu,  MD;  Location: [facility] INVAS[facility] CV LAB;  Service: Cardiovascular;  Laterality: Left;   PERIPHERAL VASCULAR CATHETERIZATION Left 01/02/2016   Procedure: Lower Extremity Angiography;  Surgeon: Selinda GORMAN Gu, MD;  Location: [facility] INVASIVE CV LAB;  Service: Cardiovascular;  Laterality: Left;   PERIPHERAL VASCULAR CATHETERIZATION  01/02/2016   Procedure: Lower Extremity Intervention;  Surgeon: Selinda GORMAN Gu, MD;  Location: [facility] INV[facility] CV [facility];  Service: Cardiovascular;;   THROMBECTOMY FEMORAL ARTERY Left 04/26/2015   Procedure: THROMBECTOMY FEMORAL ARTERY;  Surgeon: Selinda GORMAN Gu, MD;  Location: [facility] ORS;  Service: Vascular;  Laterality: Left;   VENTRAL HERNIA REPAIR N/A 07/25/2023   Procedure: HERNIA REPAIR VENTRAL ADULT, open;  Surgeon: Desiderio Schanz, MD;  Location: [facility] ORS;  Service: General;  Laterality: N/A;    Allergies  Allergen Reactions   Aspirin  Nausea And Vomiting and Other (See Comments)    Pt states aspirin  makes her cramp and have to use coated kind.   Zyrtec [Cetirizine] Other (See Comments)    Upset stomach   Tramadol Rash    Prior to Admission medications   Medication Sig Start Date End Date Taking? Authorizing Provider  acetaminophen  (TYLENOL ) 500 MG tablet Take 2 tablets (1,000 mg total) by mouth every 6 (six) hours as needed for mild pain (pain score 1-3). 07/25/23  Yes Piscoya, Schanz, MD  albuterol  (VENTOLIN  HFA) 108 (90 Base) MCG/ACT inhaler Inhale 2 puffs into the lungs every 4 (four) hours as needed for wheezing or shortness of breath. 12/23/23   McDonough, Tinnie POUR, PA-C  aspirin  EC 81 MG EC tablet Take 1 tablet (81 mg total) by mouth daily. 04/29/15   Gu Selinda GORMAN, MD  atorvastatin  (LIPITOR ) 80 MG tablet Take 1 tablet (80 mg total) by mouth daily. 01/13/24   Abernathy, Alyssa, NP  budesonide -formoterol  (SYMBICORT ) 80-4.5 MCG/ACT inhaler INHALE 2 PUFFS ONCE DAILY 12/23/23   McDonough, Lauren K, PA-C  cephALEXin  (KEFLEX ) 500 MG capsule Take 1 capsule (500 mg total) by mouth 2 (two)  times daily for 7 days. 04/03/24 04/10/24  Willo Dunnings, MD  Cholecalciferol  (VITAMIN D -3 PO) Take 1 tablet by mouth daily.    [provider]  cyclobenzaprine  (FLEXERIL ) 5 MG tablet Take 1 tablet (5 mg total) by mouth at bedtime as needed for muscle spasms. 01/13/24   Abernathy, Alyssa, NP  ezetimibe  (ZETIA ) 10 MG tablet Take 1 tablet (10 mg total) by mouth daily. 01/13/24   Liana Fish, NP  ferrous sulfate  325 (65 FE) MG tablet Take 325 mg by mouth daily.    [provider]  gabapentin  (NEURONTIN ) 100 MG capsule Take 1 capsule (100 mg total) by mouth 3 (three) times daily. 06/20/23   Victoria Ruts, MD  losartan  (COZAAR ) 50 MG tablet Take 2 tablets (100 mg total) by mouth daily. 01/13/24   Abernathy, Alyssa, NP  metoprolol  succinate (TOPROL -XL) 50 MG 24 hr tablet  TAKE 1 TABLET BY MOUTH ONCE DAILY WITH  OR  IMMEDIATELY  FOLLOWING  A  MEAL 01/18/23   Fernand Denyse LABOR, MD  nicotine  (NICODERM CQ  - DOSED IN MG/24 HOURS) 14 mg/24hr patch Place 1 patch (14 mg total) onto the skin daily. Patient not taking: Reported on 01/17/2024 06/21/23   Victoria Ruts, MD  ondansetron  (ZOFRAN -ODT) 4 MG disintegrating tablet Take 1 tablet (4 mg total) by mouth every 8 (eight) hours as needed for nausea or vomiting. 04/03/24   Willo Dunnings, MD  thiamine  (VITAMIN B-1) 100 MG tablet Take 1 tablet (100 mg total) by mouth daily. 06/21/23   Victoria Ruts, MD    Social History   Socioeconomic History   Marital status: Single    Spouse name: Not on file   Number of children: Not on file   Years of education: Not on file   Highest education level: Not on file  Occupational History   Not on file  Tobacco Use   Smoking status: Every Day    Current packs/day: 1.00    Types: Cigarettes    Passive exposure: Past   Smokeless tobacco: Never  Vaping Use   Vaping status: Never Used  Substance and Sexual Activity   Alcohol use: Yes    Alcohol/week: 0.0 standard drinks of alcohol    Comment:  weekends, couple beers, pint of liquer only on weekends.  Last drank approx 1 month ago.   Drug use: No   Sexual activity: Not Currently  Other Topics Concern   Not on file  Social History Narrative   Lives with family    Social Drivers of Health   Financial Resource Strain: Low Risk  (03/21/2023)   Received [facility]   Overall Financial Resource Strain (CARDIA)    Difficulty of Paying Living Expenses: Not hard at all  Food Insecurity: No Food Insecurity (04/06/2024)   Hunger Vital Sign    Worried About Running Out of Food in the Last Year: Never true    Ran Out of Food in the Last Year: Never true  Transportation Needs: No Transportation Needs (04/06/2024)   PRAPARE - Administrator, Civil Service (Medical): No    Lack of Transportation (Non-Medical): No  Physical Activity: Inactive (04/04/2018)   Exercise Vital Sign    Days of Exercise per Week: 0 days    Minutes of Exercise per Session: 0 min  Stress: No Stress Concern Present (04/04/2018)   [employer] of Occupational Health - Occupational Stress Questionnaire    Feeling of Stress : Only a little  Social Connections: Socially Isolated (04/06/2024)   Social Connection and Isolation Panel    Frequency of Communication with Friends and Family: Once a week    Frequency of Social Gatherings with Friends and Family: Once a week    Attends Religious Services: 1 to 4 times per year    Active Member of [employer] or Organizations: No    Attends Banker Meetings: Never    Marital Status: Never married  Intimate Partner Violence: Not At Risk (04/06/2024)   Humiliation, Afraid, Rape, and Kick questionnaire    Fear of Current or Ex-Partner: No    Emotionally Abused: No    Physically Abused: No    Sexually Abused: No     Family History  Problem Relation Age of Onset   Hypertension Mother    Hypertension Father     ROS: Otherwise negative unless mentioned in HPI  Physical Examination  Vitals:    04/06/24 0649 04/06/24 0700  BP: 114/68 116/69  Pulse: 61   Resp: 14 15  Temp:    SpO2: 98%    Body mass index is 19.22 kg/m.  General:  WDWN in NAD Gait: Not observed HENT: WNL, normocephalic Pulmonary: normal non-labored breathing, without Rales, rhonchi,  wheezing Cardiac: regular, without  Murmurs, rubs or gallops; without carotid bruits Abdomen: Positive bowel sounds, soft, Tenderness noted in the periumbilical area./ Positive distention , no masses Skin: without rashes Vascular Exam/Pulses: Palpable pulses throughout. Al extremities are warm to touch.  Extremities: without ischemic changes, without Gangrene , without cellulitis; without open wounds;  Musculoskeletal: no muscle wasting or atrophy  Neurologic: A&O X 3;  No focal weakness or paresthesias are detected; speech is fluent/normal Psychiatric:  The pt has Normal affect. Lymph:  Unremarkable  CBC    Component Value Date/Time   WBC 6.0 04/05/2024 2053   RBC 3.40 (L) 04/05/2024 2053   HGB 11.2 (L) 04/05/2024 2053   HGB 12.7 05/28/2022 1103   HCT 34.0 (L) 04/05/2024 2053   HCT 36.6 05/28/2022 1103   PLT 301 04/05/2024 2053   PLT 151 05/28/2022 1103   MCV 100.0 04/05/2024 2053   MCV 96 05/28/2022 1103   MCV 88 10/15/2013 1123   MCH 32.9 04/05/2024 2053   MCHC 32.9 04/05/2024 2053   RDW 13.4 04/05/2024 2053   RDW 12.9 05/28/2022 1103   RDW 14.9 (H) 10/15/2013 1123   LYMPHSABS 1.7 05/05/2023 1748   LYMPHSABS 1.5 05/28/2022 1103   LYMPHSABS 0.9 (L) 10/15/2013 1123   MONOABS 0.3 05/05/2023 1748   MONOABS 0.4 10/15/2013 1123   EOSABS 0.1 05/05/2023 1748   EOSABS 0.0 05/28/2022 1103   EOSABS 0.0 10/15/2013 1123   BASOSABS 0.0 05/05/2023 1748   BASOSABS 0.0 05/28/2022 1103   BASOSABS 0.0 10/15/2013 1123    BMET    Component Value Date/Time   NA 137 04/05/2024 2053   NA 140 05/28/2022 1103   NA 131 (L) 10/15/2013 1123   K 3.3 (L) 04/05/2024 2053   K 3.6 10/15/2013 1123   CL 103 04/05/2024 2053   CL  102 10/15/2013 1123   CO2 22 04/05/2024 2053   CO2 24 10/15/2013 1123   GLUCOSE 104 (H) 04/05/2024 2053   GLUCOSE 111 (H) 10/15/2013 1123   BUN 16 04/05/2024 2053   BUN 13 05/28/2022 1103   BUN 12 10/15/2013 1123   CREATININE 1.20 (H) 04/05/2024 2053   CREATININE 1.20 10/15/2013 1123   CALCIUM  10.2 04/05/2024 2053   CALCIUM  9.1 10/15/2013 1123   GFRNONAA 49 (L) 04/05/2024 2053   GFRNONAA 50 (L) 10/15/2013 1123   GFRAA >60 03/02/2020 1232   GFRAA 58 (L) 10/15/2013 1123    COAGS: Lab Results  Component Value Date   INR 1.1 08/05/2021   INR 0.9 12/31/2020   INR 0.94 04/25/2015     Non-Invasive Vascular Imaging:   EXAM: CTA ABDOMEN AND PELVIS WITH AND WITHOUT CONTRAST 04/06/2024 01:53:13 AM   TECHNIQUE: CTA images of the abdomen and pelvis with and without intravenous contrast. Three-dimensional MIP/volume rendered formations were performed. Automated exposure control, iterative reconstruction, and/or weight based adjustment of the mA/kV was utilized to reduce the radiation dose to as low as reasonably achievable.   COMPARISON: None available.   CLINICAL HISTORY: Mesenteric ischemia, acute; Mesenteric ischemia, chronic. Pt to ed from home via [facility] for abd pain. Just seen Friday here for same. All vitals WNL. Pt is caox4, in  no acute distress and ambulatory on scene fro EMS.   FINDINGS:   VASCULATURE: Advanced mixed density atherosclerotic plaque in the aorta and its mesenteric, renal, and iliac artery branches. Moderate narrowing of the splenic artery.   AORTA: No acute finding. No abdominal aortic aneurysm. No dissection.   CELIAC TRUNK: Severe narrowing at the origin of the celiac axis.   SUPERIOR MESENTERIC ARTERY: Occlusion of the proximal SMA. Extensive atherosclerotic calcification involving the mid and distal superior mesenteric artery.   INFERIOR MESENTERIC ARTERY: Severe narrowing at the origin.   RENAL ARTERIES: Severe narrowing of the left  renal artery. There is an accessory left renal artery. Moderate narrowing of the right renal artery near the origin.   ILIAC ARTERIES: Moderate narrowing of the left external and internal iliac arteries. Severe narrowing of the proximal right internal iliac artery. Moderate narrowing of the right external iliac artery. Probable changes of endarterectomy in the left superficial  femoral artery.   LIVER: Patent portal vein. No acute abnormality.   GALLBLADDER AND BILE DUCTS: Cholecystectomy.   SPLEEN: The spleen is unremarkable.   PANCREAS: Pancreatic divisum.   ADRENAL GLANDS: Bilateral adrenal glands demonstrate no acute abnormality.   KIDNEYS, URETERS AND BLADDER: The bladder is not distended.   GI AND BOWEL: No bowel obstruction. Mild distention of the stomach. No bowel wall thickening. Mucosal hyper-enhancement of the small bowel. This can be a normal finding seen with late arterial phase enhancement however given significant narrowing in all 3 mesenteric arteries, hyperemia secondary to reperfusion in the setting of chronic mesenteric ischemia should be considered.   REPRODUCTIVE: Hysterectomy.   PERITONEUM AND RETRPERITONEUM: No ascites or free air.   LUNG BASE: No acute abnormality.   LYMPH NODES: No lymphadenopathy.   BONES AND SOFT TISSUES: No acute abnormality of the bones. No acute soft tissue abnormality.   IMPRESSION: 1. Severe narrowing at the origin of the celiac axis and inferior mesenteric artery. 2. 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 3. Mucosal hyper-enhancement of the small bowel, possibly related to reperfusion in the setting of chronic mesenteric ischemia.    Statin:  Yes.   Beta Blocker:  Yes.   Aspirin :  No. ACEI:  No. ARB:  Yes.   CCB use:  No Other antiplatelets/anticoagulants:  No.    ASSESSMENT/PLAN: This is a 68 y.o. female who  presents to [facility] emergency department due to abdominal pain radiating to her back associated with nausea and occasional vomiting with blood in her stool.  Upon workup patient underwent CTA of the chest abdomen pelvis showing occlusion of the superior mesenteric artery and severe narrowing of the origin of the celiac axis and inferior mesenteric artery.  This most likely explains her severe abdominal pain and inability to eat.  Therefore vascular surgery plans on taking the patient to the vascular lab on 04/06/2024 for a visceral angiogram with possible intervention.  I had a long detailed discussion this morning at the bedside with the patient concerning the procedure, benefits, risk, and complications.  Patient verbalizes her understanding and wishes to proceed.  I answered all the patient's questions this morning.  Patient has been n.p.o. since midnight last night for procedure today.  Patient did present with a lactic acid of 2.0 which is now down to 1.3.  Patient's BUN and creatinine is 16/1.20 patient's hemoglobin is 11.2 and hematocrit is 34.  Patient was started on a heparin  infusion in the emergency department  which is still running.   -I discussed the case in detail with Dr. Selinda Gu MD and he agrees with plan.   Gwendlyn JONELLE Shank [facility] 04/06/2024 7:45 AM

## 2024-04-06 NOTE — Interval H&P Note (Signed)
 History and Physical Interval Note:  04/06/2024 2:34 PM  [name]  has presented today for surgery, with the diagnosis of Mesenteric Ischemia.  The various methods of treatment have been discussed with the patient and family. After consideration of risks, benefits and other options for treatment, the patient has consented to  Procedure(s): VISCERAL ANGIOGRAPHY (N/A) as a surgical intervention.  The patient's history has been reviewed, patient examined, no change in status, stable for surgery.  I have reviewed the patient's chart and labs.  Questions were answered to the patient's satisfaction.     [name]

## 2024-04-06 NOTE — Progress Notes (Signed)
 PHARMACY - ANTICOAGULATION CONSULT NOTE  Pharmacy Consult for Heparin   Indication: chest pain/ACS  Allergies  Allergen Reactions   Aspirin  Nausea And Vomiting and Other (See Comments)    Pt states aspirin  makes her cramp and have to use coated kind.   Zyrtec [Cetirizine] Other (See Comments)    Upset stomach   Tramadol Rash    Patient Measurements: Height: 5' 4 (162.6 cm) IBW/kg (Calculated) : 54.7  Vital Signs: Temp: 97.8 F (36.6 C) (08/04 0639) Temp Source: Oral (08/04 0639) BP: 107/60 (08/04 0830) Pulse Rate: 61 (08/04 0649)  Labs: Recent Labs    04/03/24 1202 04/05/24 2053 04/06/24 0632  HGB 11.9* 11.2*  --   HCT 35.7* 34.0*  --   PLT 262 301  --   HEPARINUNFRC  --   --  0.42  CREATININE 1.82* 1.20*  --     Estimated Creatinine Clearance: 36 mL/min (A) (by C-G formula based on SCr of 1.2 mg/dL (H)).   Medical History: Past Medical History:  Diagnosis Date   Adenocarcinoma of upper lobe of right lung (HCC)    a.) s/p posterior segmentectomy on 03/20/23 at [facility]   Anemia    Arthritis    Asthma    Carotid artery stenosis    a.) doppler 05/14/2017: 1-39% BICA; b.) doppler 04/05/2018: 50-69% BICA; c.) doppler 06/12/2019: 40-59% BICA; d.) doppler 06/14/2020, 06/14/2021: 1-39% BICA; e.) doppler 06/19/2022: 1.39% RICA, 40-59% LICA   Colon polyp    COPD (chronic obstructive pulmonary disease) (HCC)    Coronary artery disease 04/03/2006   a.) LHC 04/03/2006: 25% mLM, 25% mLAD, 50% D1, 25/50% mLCx, 50% dLCx, 25% OM2, 100% pRCA --> med mgmt; b.) LHC 04/25/2010: 25% pLM, 25% pLAD, 50% pLCx, 70% dLCx, 100% pRCA --> med mgmt   Depression    Diastolic dysfunction    a.) TTE 04/05/2018: EF 65%, no RWMAs, G1DD, triv MR/TR; b.) TTE 10/15/2022: EF 88%, mod LVH, G1DD, triv TR, mild MR; c.) TTE 02/27/2023: EF 65%, no RWMAs, triv TR/MR, RVSP 35; d.) TTE 03/21/2023: EF 65%, no RWMAs, norm RVSF, PASP 44   Environmental allergies    Headache    Hyperlipidemia    Hypertension     Hypoxic respiratory failure (HCC)    Internal hemorrhoids    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[facility])    a.) thinks it was in 2007 --> LHC 04/03/2006 revealed a 100% pRCA --> medically managed   Necrotizing pneumonia (HCC)    Pulmonary HTN (HCC)    a.) TTE 02/27/2023: RVSP 35 mmHg; b.) TTE 03/21/2023: PASP 44 mmHg   PVD (peripheral vascular disease) with claudication (HCC)    Shortness of breath dyspnea    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  No prior anticoag noted. CrCl = 36 ml/min   8/4 0632 HL 0.42  Therapeutic but drawn 3.5 hours after drip start  Goal of Therapy:  Heparin  level 0.3-0.7 units/ml Monitor platelets by anticoagulation protocol: Yes   Plan:  8/4 0632 HL 0.42  Therapeutic but drawn 3.5 hours after drip start Continue heparin  infusion at 600  units/hr Check anti-Xa level in 6 hours and daily while on heparin  CBC pending Continue to monitor H&H and platelets  Allean Haas PharmD Clinical Pharmacist 04/06/2024

## 2024-04-06 NOTE — H&P (Signed)
 History and Physical    Patient: [name] FMW:[id number] DOB: 11/15/1955 DOA: 04/06/2024 DOS: the patient was seen and examined on 04/06/2024 PCP: Liana Fish, NP  Patient coming from: Home  Chief Complaint:  Chief Complaint  Patient presents with   Abdominal Pain    HPI: [name] is a 68 y.o. female with medical history significant for Depression, hypertension, hyperlipidemia, COPD, CAD, PVD, right lung cancer s/p surgery, tobacco use disorder being admitted for worsening symptoms of chronic mesenteric ischemia.  She presents by EMS for the third time in a week with ongoing periumbilical abdominal pain radiating to the back, associated with nausea occasional vomiting and blood in her stool .  In the ED, BP 189/92 with otherwise normal vitals. Labs notable for normal WBC of 6.0 with lactic acid 2.0.  Hemoglobin 11.2 and creatinine1.2, potassium 3.3. A wet prep was done that showed trichomoniasis and WBCs EKG showed sinus at 69 CT angio showed the following: 1. Severe narrowing at the origin of the celiac axis and inferior mesenteric artery. 2. 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 3. Mucosal hyper-enhancement of the small bowel, possibly related to reperfusion in the setting of chronic mesenteric ischemia  Patient treated with morphine , Zofran  and NS bolus and started on a heparin  infusion. She was also given metronidazole  for abnormal wet prep. Admission requested     Review of Systems: As mentioned in the history of present illness. All other systems reviewed and are negative.  Past Medical History:  Diagnosis Date   Adenocarcinoma of upper lobe of right lung (HCC)    a.) s/p posterior segmentectomy on 03/20/23 at [facility]   Anemia    Arthritis    Asthma    Carotid artery stenosis    a.) doppler 05/14/2017: 1-39% BICA; b.) doppler 04/05/2018: 50-69% BICA; c.) doppler  06/12/2019: 40-59% BICA; d.) doppler 06/14/2020, 06/14/2021: 1-39% BICA; e.) doppler 06/19/2022: 1.39% RICA, 40-59% LICA   Colon polyp    COPD (chronic obstructive pulmonary disease) (HCC)    Coronary artery disease 04/03/2006   a.) LHC 04/03/2006: 25% mLM, 25% mLAD, 50% D1, 25/50% mLCx, 50% dLCx, 25% OM2, 100% pRCA --> med mgmt; b.) LHC 04/25/2010: 25% pLM, 25% pLAD, 50% pLCx, 70% dLCx, 100% pRCA --> med mgmt   Depression    Diastolic dysfunction    a.) TTE 04/05/2018: EF 65%, no RWMAs, G1DD, triv MR/TR; b.) TTE 10/15/2022: EF 88%, mod LVH, G1DD, triv TR, mild MR; c.) TTE 02/27/2023: EF 65%, no RWMAs, triv TR/MR, RVSP 35; d.) TTE 03/21/2023: EF 65%, no RWMAs, norm RVSF, PASP 44   Environmental allergies    Headache    Hyperlipidemia    Hypertension    Hypoxic respiratory failure (HCC)    Internal hemorrhoids    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[facility])    a.) thinks it was in 2007 --> LHC 04/03/2006 revealed a 100% pRCA --> medically managed   Necrotizing pneumonia (HCC)    Pulmonary HTN (HCC)    a.) TTE 02/27/2023: RVSP 35 mmHg; b.) TTE 03/21/2023: PASP 44 mmHg   PVD (peripheral vascular disease) with claudication (HCC)    Shortness of breath dyspnea    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    Substance abuse (HCC)    Substance induced mood disorder (HCC)    Suicidal ideation    Thrombocytopenia (HCC)    Tobacco abuse    Unstable angina (HCC)    Ventral hernia  Vitamin D  deficiency    Past Surgical History:  Procedure Laterality Date   ABDOMINAL HYSTERECTOMY     APPENDECTOMY     CARDIAC CATHETERIZATION     CARDIAC SURGERY     CHOLECYSTECTOMY     COLONOSCOPY  12/15/2011   OH->bleeding internal hemorrhoids, otherwise normal   COLONOSCOPY WITH PROPOFOL  N/A 04/12/2015   Procedure: COLONOSCOPY WITH PROPOFOL ;  Surgeon: Rogelia Copping, MD;  Location: [facility] ENDOSCOPY;  Service: Endoscopy;  Laterality: N/A;   COLONOSCOPY WITH PROPOFOL  N/A 01/05/2020   Procedure: COLONOSCOPY WITH  PROPOFOL ;  Surgeon: Copping Rogelia, MD;  Location: [facility] ENDOSCOPY;  Service: Endoscopy;  Laterality: N/A;   ENDARTERECTOMY FEMORAL Left 04/26/2015   Procedure: ENDARTERECTOMY FEMORAL;  Surgeon: Selinda GORMAN Gu, MD;  Location: [facility] ORS;  Service: Vascular;  Laterality: Left;   ENDOVASCULAR STENT INSERTION Bilateral    Legs   FLEXIBLE BRONCHOSCOPY N/A 02/02/2015   Procedure: FLEXIBLE BRONCHOSCOPY;  Surgeon: Elfreda DELENA Bathe, MD;  Location: [facility] ORS;  Service: Pulmonary;  Laterality: N/A;   HEMORRHOID SURGERY     lung mass removed     PERIPHERAL VASCULAR CATHETERIZATION Left 04/25/2015   Procedure: Lower Extremity Angiography;  Surgeon: Selinda GORMAN Gu, MD;  Location: [facility] INV[facility]IVE CV [facility];  Service: Cardiovascular;  Laterality: Left;   PERIPHERAL VASCULAR CATHETERIZATION Left 04/25/2015   Procedure: Lower Extremity Intervention;  Surgeon: Selinda GORMAN Gu, MD;  Location: [facility] CV [facility];  Service: Cardiovascular;  Laterality: Left;   PERIPHERAL VASCULAR CATHETERIZATION Left 01/02/2016   Procedure: Lower Extremity Angiography;  Surgeon: Selinda GORMAN Gu, MD;  Location: [facility] CV [facility];  Service: Cardiovascular;  Laterality: Left;   PERIPHERAL VASCULAR CATHETERIZATION  01/02/2016   Procedure: Lower Extremity Intervention;  Surgeon: Selinda GORMAN Gu, MD;  Location: [facility] CV [facility];  Service: Cardiovascular;;   THROMBECTOMY FEMORAL ARTERY Left 04/26/2015   Procedure: THROMBECTOMY FEMORAL ARTERY;  Surgeon: Selinda GORMAN Gu, MD;  Location: [facility] ORS;  Service: Vascular;  Laterality: Left;   VENTRAL HERNIA REPAIR N/A 07/25/2023   Procedure: HERNIA REPAIR VENTRAL ADULT, open;  Surgeon: Desiderio Schanz, MD;  Location: [facility] ORS;  Service: General;  Laterality: N/A;   Social History:  reports that she has been smoking cigarettes. She has been exposed to tobacco smoke. She has never used smokeless tobacco. She reports current alcohol use. She reports that she does not use drugs.  Allergies  Allergen Reactions   Aspirin  Nausea  And Vomiting and Other (See Comments)    Pt states aspirin  makes her cramp and have to use coated kind.   Zyrtec [Cetirizine] Other (See Comments)    Upset stomach   Tramadol Rash    Family History  Problem Relation Age of Onset   Hypertension Mother    Hypertension Father     Prior to Admission medications   Medication Sig Start Date End Date Taking? Authorizing Provider  acetaminophen  (TYLENOL ) 500 MG tablet Take 2 tablets (1,000 mg total) by mouth every 6 (six) hours as needed for mild pain (pain score 1-3). Patient not taking: Reported on 01/17/2024 07/25/23   Desiderio Schanz, MD  albuterol  (VENTOLIN  HFA) [phone] Base) MCG/ACT inhaler Inhale 2 puffs into the lungs every 4 (four) hours as needed for wheezing or shortness of breath. 12/23/23   McDonough, Tinnie POUR, PA-C  aspirin  EC 81 MG EC tablet Take 1 tablet (81 mg total) by mouth daily. 04/29/15   Gu Selinda GORMAN, MD  atorvastatin  (LIPITOR ) 80 MG tablet Take 1 tablet (80 mg total) by mouth  daily. 01/13/24   Abernathy, Alyssa, NP  budesonide -formoterol  (SYMBICORT ) 80-4.5 MCG/ACT inhaler INHALE 2 PUFFS ONCE DAILY 12/23/23   McDonough, Lauren K, PA-C  cephALEXin  (KEFLEX ) 500 MG capsule Take 1 capsule (500 mg total) by mouth 2 (two) times daily for 7 days. 04/03/24 04/10/24  Willo Dunnings, MD  Cholecalciferol  (VITAMIN D -3 PO) Take 1 tablet by mouth daily.    [provider]  cyclobenzaprine  (FLEXERIL ) 5 MG tablet Take 1 tablet (5 mg total) by mouth at bedtime as needed for muscle spasms. 01/13/24   Abernathy, Alyssa, NP  ezetimibe  (ZETIA ) 10 MG tablet Take 1 tablet (10 mg total) by mouth daily. 01/13/24   Liana Fish, NP  ferrous sulfate  325 (65 FE) MG tablet Take 325 mg by mouth daily.    [provider]  gabapentin  (NEURONTIN ) 100 MG capsule Take 1 capsule (100 mg total) by mouth 3 (three) times daily. 06/20/23   Victoria Ruts, MD  losartan  (COZAAR ) 50 MG tablet Take 2 tablets (100 mg total) by mouth daily. 01/13/24    Abernathy, Alyssa, NP  metoprolol  succinate (TOPROL -XL) 50 MG 24 hr tablet TAKE 1 TABLET BY MOUTH ONCE DAILY WITH  OR  IMMEDIATELY  FOLLOWING  A  MEAL 01/18/23   Fernand Denyse LABOR, MD  nicotine  (NICODERM CQ  - DOSED IN MG/24 HOURS) 14 mg/24hr patch Place 1 patch (14 mg total) onto the skin daily. Patient not taking: Reported on 01/17/2024 06/21/23   Victoria Ruts, MD  ondansetron  (ZOFRAN -ODT) 4 MG disintegrating tablet Take 1 tablet (4 mg total) by mouth every 8 (eight) hours as needed for nausea or vomiting. 04/03/24   Willo Dunnings, MD  thiamine  (VITAMIN B-1) 100 MG tablet Take 1 tablet (100 mg total) by mouth daily. 06/21/23   Victoria Ruts, MD    Physical Exam: Vitals:   04/05/24 2049 04/05/24 2052  BP:  (!) 189/92  Pulse:  74  Resp:  18  Temp:  97.6 F (36.4 C)  TempSrc:  Oral  SpO2:  99%  Height: 5' 4 (1.626 m)    Physical Exam Vitals and nursing note reviewed.  Constitutional:      General: She is not in acute distress. HENT:     Head: Normocephalic and atraumatic.  Cardiovascular:     Rate and Rhythm: Normal rate and regular rhythm.     Heart sounds: Normal heart sounds.  Pulmonary:     Effort: Pulmonary effort is normal.     Breath sounds: Normal breath sounds.  Abdominal:     Palpations: Abdomen is soft.     Tenderness: There is abdominal tenderness in the periumbilical area.  Neurological:     Mental Status: Mental status is at baseline.     Labs on Admission: I have personally reviewed following labs and imaging studies  CBC: Recent Labs  Lab 04/03/24 1202 04/05/24 2053  WBC 6.1 6.0  HGB 11.9* 11.2*  HCT 35.7* 34.0*  MCV 99.2 100.0  PLT 262 301   Basic Metabolic Panel: Recent Labs  Lab 04/03/24 1202 04/05/24 2053  NA 136 137  K 3.2* 3.3*  CL 101 103  CO2 20* 22  GLUCOSE 86 104*  BUN 21 16  CREATININE 1.82* 1.20*  CALCIUM  10.1 10.2  MG 1.4*  --    GFR: Estimated Creatinine Clearance: 36 mL/min (A) (by C-G formula based on SCr of 1.2  mg/dL (H)). Liver Function Tests: Recent Labs  Lab 04/03/24 1202 04/05/24 2053  AST 22 23  ALT 14 16  ALKPHOS  50 61  BILITOT 0.7 0.4  PROT 7.3 7.8  ALBUMIN 4.0 4.2   Recent Labs  Lab 04/03/24 1202 04/05/24 2053  LIPASE 29 31   No results for input(s): AMMONIA in the last 168 hours. Coagulation Profile: No results for input(s): INR, PROTIME in the last 168 hours. Cardiac Enzymes: No results for input(s): CKTOTAL, CKMB, CKMBINDEX, TROPONINI in the last 168 hours. BNP (last 3 results) No results for input(s): PROBNP in the last 8760 hours. HbA1C: No results for input(s): HGBA1C in the last 72 hours. CBG: No results for input(s): GLUCAP in the last 168 hours. Lipid Profile: No results for input(s): CHOL, HDL, LDLCALC, TRIG, CHOLHDL, LDLDIRECT in the last 72 hours. Thyroid  Function Tests: No results for input(s): TSH, T4TOTAL, FREET4, T3FREE, THYROIDAB in the last 72 hours. Anemia Panel: No results for input(s): VITAMINB12, FOLATE, FERRITIN, TIBC, IRON, RETICCTPCT in the last 72 hours. Urine analysis:    Component Value Date/Time   COLORURINE YELLOW (A) 04/03/2024 1739   APPEARANCEUR HAZY (A) 04/03/2024 1739   APPEARANCEUR Clear 05/25/2022 1000   LABSPEC 1.024 04/03/2024 1739   LABSPEC 1.015 10/15/2013 1124   PHURINE 5.0 04/03/2024 1739   GLUCOSEU NEGATIVE 04/03/2024 1739   GLUCOSEU Negative 10/15/2013 1124   HGBUR NEGATIVE 04/03/2024 1739   BILIRUBINUR NEGATIVE 04/03/2024 1739   BILIRUBINUR Negative 05/25/2022 1000   BILIRUBINUR Negative 10/15/2013 1124   KETONESUR NEGATIVE 04/03/2024 1739   PROTEINUR NEGATIVE 04/03/2024 1739   NITRITE NEGATIVE 04/03/2024 1739   LEUKOCYTESUR LARGE (A) 04/03/2024 1739   LEUKOCYTESUR Negative 10/15/2013 1124    Radiological Exams on Admission: CT Angio Abd/Pel W and/or Wo Contrast Result Date: 04/06/2024 EXAM: CTA ABDOMEN AND PELVIS WITH AND WITHOUT CONTRAST 04/06/2024 01:53:13  AM TECHNIQUE: CTA images of the abdomen and pelvis with and without intravenous contrast. Three-dimensional MIP/volume rendered formations were performed. Automated exposure control, iterative reconstruction, and/or weight based adjustment of the mA/kV was utilized to reduce the radiation dose to as low as reasonably achievable. COMPARISON: None available. CLINICAL HISTORY: Mesenteric ischemia, acute; Mesenteric ischemia, chronic. Pt to ed from home via [facility] for abd pain. Just seen Friday here for same. All vitals WNL. Pt is caox4, in no acute distress and ambulatory on scene fro EMS. FINDINGS: VASCULATURE: Advanced mixed density atherosclerotic plaque in the aorta and its mesenteric, renal, and iliac artery branches. Moderate narrowing of the splenic artery. AORTA: No acute finding. No abdominal aortic aneurysm. No dissection. CELIAC TRUNK: Severe narrowing at the origin of the celiac axis. SUPERIOR MESENTERIC ARTERY: Occlusion of the proximal SMA. Extensive atherosclerotic calcification involving the mid and distal superior mesenteric artery. INFERIOR MESENTERIC ARTERY: Severe narrowing at the origin. RENAL ARTERIES: Severe narrowing of the left renal artery. There is an accessory left renal artery. Moderate narrowing of the right renal artery near the origin. ILIAC ARTERIES: Moderate narrowing of the left external and internal iliac arteries. Severe narrowing of the proximal right internal iliac artery. Moderate narrowing of the right external iliac artery. Probable changes of endarterectomy in the left superficial  femoral artery. LIVER: Patent portal vein. No acute abnormality. GALLBLADDER AND BILE DUCTS: Cholecystectomy. SPLEEN: The spleen is unremarkable. PANCREAS: Pancreatic divisum. ADRENAL GLANDS: Bilateral adrenal glands demonstrate no acute abnormality. KIDNEYS, URETERS AND BLADDER: The bladder is not distended. GI AND BOWEL: No bowel obstruction. Mild distention of the stomach. No bowel wall  thickening. Mucosal hyper-enhancement of the small bowel. This can be a normal finding seen with late arterial phase enhancement however given significant narrowing in  all 3 mesenteric arteries, hyperemia secondary to reperfusion in the setting of chronic mesenteric ischemia should be considered. REPRODUCTIVE: Hysterectomy. PERITONEUM AND RETRPERITONEUM: No ascites or free air. LUNG BASE: No acute abnormality. LYMPH NODES: No lymphadenopathy. BONES AND SOFT TISSUES: No acute abnormality of the bones. No acute soft tissue abnormality. IMPRESSION: 1. Severe narrowing at the origin of the celiac axis and inferior mesenteric artery. 2. 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 3. Mucosal hyper-enhancement of the small bowel, possibly related to reperfusion in the setting of chronic mesenteric ischemia. Electronically signed by: Norman Gatlin MD 04/06/2024 02:21 AM EDT RP Workstation: [id number]   Data Reviewed for HPI: Relevant notes from primary care and specialist visits, past discharge summaries as available in EHR, including Care Everywhere. Prior diagnostic testing as pertinent to current admission diagnoses Updated medications and problem lists for reconciliation ED course, including vitals, labs, imaging, treatment and response to treatment Triage notes, nursing and pharmacy notes and ED provider's notes Notable results as noted above in HPI      Assessment and Plan: * Acute on chronic mesenteric ischemia (HCC) Continue heparin  infusion IV pain control Consult vascular Will keep n.p.o. in case of procedure  Coronary artery disease due to lipid rich plaque PAD Continue atorvastatin , ezetimibe , losartan  and metoprolol  Continue heparin  infusion Denies chest pain and EKG is nonacute  Tobacco use disorder Nicotine  patch  COPD (chronic obstructive pulmonary disease) (HCC) Not acutely  exacerbated DuoNeb as needed  Essential hypertension Continue home losartan  and metoprolol  Hydralazine IV as needed for additional control  Malignant neoplasm of upper lobe of right lung (HCC) S/p right upper lobe posterior segmentectomy (March 20, 2023) [facility]   Trichomoniasis Treated with metronidazole  2000 mg x 1  Depression Continue home meds        DVT prophylaxis: heparin  infusion  Consults: Vascular, Dr Marea  Advance Care Planning:   Code Status: Prior   Family Communication: none  Disposition Plan: Back to previous home environment  Severity of Illness: The appropriate patient status for this patient is OBSERVATION. Observation status is judged to be reasonable and necessary in order to provide the required intensity of service to ensure the patient's safety. The patient's presenting symptoms, physical exam findings, and initial radiographic and laboratory data in the context of their medical condition is felt to place them at decreased risk for further clinical deterioration. Furthermore, it is anticipated that the patient will be medically stable for discharge from the hospital within 2 midnights of admission.   Author: Delayne LULLA Solian, MD 04/06/2024 3:39 AM  For on call review [email or web address].

## 2024-04-06 NOTE — Hospital Course (Signed)
 SABRA

## 2024-04-06 NOTE — ED Notes (Signed)
 Pt in CT.

## 2024-04-06 NOTE — Op Note (Signed)
 [facility]  Percutaneous Study/Intervention Procedural Note   Date: 04/06/2024  Surgeon(s): Selinda Gu, MD  Assistants: none  Pre-operative Diagnosis: 1.  Mesenteric ischemia 2.  SMA occlusion and celiac artery stenosis   Post-operative diagnosis:  Same  Procedure(s) Performed:             1.  Ultrasound guidance for vascular access right femoral artery              2.  Catheter placement into SMA and the celiac artery from right femoral approach             3.  Aortogram and selective angiogram of the SMA and the celiac artery             4.  Stent to the celiac artery with 6 mm diameter x 37 mm length balloon expandable stent             5.  StarClose closure device right femoral artery  Contrast: 85  Fluoro time: 5.3  EBL: 5 cc  Anesthesia: Approximately 36 minutes of Moderate conscious sedation using 2 mg of Versed  and 100 mcg of Fentanyl               Indications:  Patient is a 68 y.o. female who has symptoms consistent with mesenteric ischemia. The patient has a CT scan suggesting high-grade stenosis of the celiac and inferior mesenteric arteries and occlusion of the SMA. The patient is brought in for angiography for further evaluation and potential treatment. Risks and benefits are discussed and informed consent is obtained  Procedure:  The patient was identified and appropriate procedural time out was performed.  The patient was then placed supine on the table and prepped and draped in the usual sterile fashion. Moderate conscious sedation was administered during a face to face encounter with the patient throughout the procedure with my supervision of the RN administering medicines and monitoring the patient's vital signs, pulse oximetry, telemetry and mental status throughout from the start of the procedure until the patient was taken to the recovery room. Ultrasound was used to evaluate the right common femoral artery.  It was patent .  A digital  ultrasound image was acquired.  A Seldinger needle was used to access the right common femoral artery under direct ultrasound guidance and a permanent image was performed.  A 0.035 J wire was advanced without resistance and a 5Fr sheath was placed.  Pigtail catheter was placed into the aorta and an AP aortogram was performed. This demonstrated severe calcification of the aorta, iliac arteries, and renal arteries.  There was at least some degree of stenosis in the renal artery worse on the left than the right. We transitioned to the lateral projection to image the celiac and SMA. The lateral image demonstrated poor opacification with what appeared to be significant stenosis of the celiac artery and occlusion of the SMA on the aortogram.  Image quality was fairly poor due to patient motion and severe disease.  The patient was given 4000 units of IV heparin . We upsized to a 6 Fr sheath.  A V S1 catheter was used to selectively cannulate the SMA first.  Selective imaging did demonstrate occlusion of the SMA and attempts to cross this initially appeared unlikely to be successful so we turned our attention to the celiac artery.  The celiac artery was then selectively cannulated with a V S1 catheter and selective imaging is performed.  This demonstrated a greater than 80% stenosis  of the origin of the celiac artery as well as a moderate stenosis in the 60 to 70% range beyond the left gastric artery and just above the split of the hepatic and splenic arteries. Based on her symptoms and these findings, I elected to treat the celiac artery to try to improve the patient's clinical course. I crossed the lesion without difficulty with Glidewire and then exchanged with a glide catheter for a supra core wire.  A 6 French Ansell sheath was then placed out the celiac artery. I then used a 6 mm diameter x 37 mm length balloon expandable stent to perform treatment of the celiac artery. I inflated the balloon to 14 atm. On completion  angiogram following this, 5-10% residual stenosis was identified. At this point, I elected to terminate the procedure. The diagnostic catheter was removed. StarClose closure device was deployed in usual fashion with excellent hemostatic result. The patient was taken to the recovery room in stable condition having tolerated the procedure well.     Findings: Occlusion of the SMA, high-grade stenosis of the celiac artery successfully treated with stent placement.  Disposition: Patient was taken to the recovery room in stable condition having tolerated the procedure well.  Complications:  None  Selinda Gu 04/06/2024 4:02 PM   This note was created with Dragon Medical transcription system. Any errors in dictation are purely unintentional.

## 2024-04-06 NOTE — Progress Notes (Signed)
 PHARMACY - ANTICOAGULATION CONSULT NOTE  Pharmacy Consult for Heparin   Indication: chest pain/ACS  Allergies  Allergen Reactions   Aspirin  Nausea And Vomiting and Other (See Comments)    Pt states aspirin  makes her cramp and have to use coated kind.   Zyrtec [Cetirizine] Other (See Comments)    Upset stomach   Tramadol Rash    Patient Measurements: Height: 5' 4 (162.6 cm) IBW/kg (Calculated) : 54.7  Vital Signs: Temp: 97.6 F (36.4 C) (08/03 2052) Temp Source: Oral (08/03 2052) BP: 189/92 (08/03 2052) Pulse Rate: 74 (08/03 2052)  Labs: Recent Labs    04/03/24 1202 04/05/24 2053  HGB 11.9* 11.2*  HCT 35.7* 34.0*  PLT 262 301  CREATININE 1.82* 1.20*    Estimated Creatinine Clearance: 36 mL/min (A) (by C-G formula based on SCr of 1.2 mg/dL (H)).   Medical History: Past Medical History:  Diagnosis Date   Adenocarcinoma of upper lobe of right lung (HCC)    a.) s/p posterior segmentectomy on 03/20/23 at [facility]   Anemia    Arthritis    Asthma    Carotid artery stenosis    a.) doppler 05/14/2017: 1-39% BICA; b.) doppler 04/05/2018: 50-69% BICA; c.) doppler 06/12/2019: 40-59% BICA; d.) doppler 06/14/2020, 06/14/2021: 1-39% BICA; e.) doppler 06/19/2022: 1.39% RICA, 40-59% LICA   Colon polyp    COPD (chronic obstructive pulmonary disease) (HCC)    Coronary artery disease 04/03/2006   a.) LHC 04/03/2006: 25% mLM, 25% mLAD, 50% D1, 25/50% mLCx, 50% dLCx, 25% OM2, 100% pRCA --> med mgmt; b.) LHC 04/25/2010: 25% pLM, 25% pLAD, 50% pLCx, 70% dLCx, 100% pRCA --> med mgmt   Depression    Diastolic dysfunction    a.) TTE 04/05/2018: EF 65%, no RWMAs, G1DD, triv MR/TR; b.) TTE 10/15/2022: EF 88%, mod LVH, G1DD, triv TR, mild MR; c.) TTE 02/27/2023: EF 65%, no RWMAs, triv TR/MR, RVSP 35; d.) TTE 03/21/2023: EF 65%, no RWMAs, norm RVSF, PASP 44   Environmental allergies    Headache    Hyperlipidemia    Hypertension    Hypoxic respiratory failure (HCC)    Internal hemorrhoids     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[facility])    a.) thinks it was in 2007 --> LHC 04/03/2006 revealed a 100% pRCA --> medically managed   Necrotizing pneumonia (HCC)    Pulmonary HTN (HCC)    a.) TTE 02/27/2023: RVSP 35 mmHg; b.) TTE 03/21/2023: PASP 44 mmHg   PVD (peripheral vascular disease) with claudication (HCC)    Shortness of breath dyspnea    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  No prior anticoag noted. CrCl = 36 ml/min   Goal of Therapy:  Heparin  level 0.3-0.7 units/ml Monitor platelets by anticoagulation protocol: Yes   Plan:  Give 3050 units bolus x 1 Start heparin  infusion at 600 units/hr Check anti-Xa level in 6 hours and daily while on heparin  Continue to monitor H&H and platelets  Fannie Alomar D 04/06/2024,2:48 AM

## 2024-04-06 NOTE — Assessment & Plan Note (Addendum)
 PAD Continue atorvastatin , ezetimibe , losartan  and metoprolol  Continue heparin  infusion Denies chest pain and EKG is nonacute

## 2024-04-06 NOTE — Assessment & Plan Note (Signed)
 Nicotine  patch.

## 2024-04-06 NOTE — Progress Notes (Signed)

## 2024-04-06 NOTE — ED Notes (Signed)
 Pt asleep in bed, respirations even and unlabored, pt on room air. IVF infusing see mar, heparin  verified at 600 units/hr.

## 2024-04-06 NOTE — Progress Notes (Signed)
 Patient is having bloody stools, per patient she has had this going on for a while now and is saying it is due to hemorrhoids. She states that sometimes it is only blood that comes out once or twice a day. She also has had diarrhea for 2 weeks. Vital signs stable. Provider notified.

## 2024-04-07 ENCOUNTER — Encounter: Payer: Self-pay | Admitting: Vascular Surgery

## 2024-04-07 DIAGNOSIS — K551 Chronic vascular disorders of intestine: Secondary | ICD-10-CM | POA: Diagnosis not present

## 2024-04-07 DIAGNOSIS — I774 Celiac artery compression syndrome: Secondary | ICD-10-CM | POA: Diagnosis present

## 2024-04-07 DIAGNOSIS — R1033 Periumbilical pain: Secondary | ICD-10-CM | POA: Diagnosis not present

## 2024-04-07 LAB — CBC
HCT: 27 % — ABNORMAL LOW (ref 36.0–46.0)
Hemoglobin: 8.6 g/dL — ABNORMAL LOW (ref 12.0–15.0)
MCH: 32.7 pg (ref 26.0–34.0)
MCHC: 31.9 g/dL (ref 30.0–36.0)
MCV: 102.7 fL — ABNORMAL HIGH (ref 80.0–100.0)
Platelets: 230 K/uL (ref 150–400)
RBC: 2.63 MIL/uL — ABNORMAL LOW (ref 3.87–5.11)
RDW: 13.4 % (ref 11.5–15.5)
WBC: 6.4 K/uL (ref 4.0–10.5)
nRBC: 0 % (ref 0.0–0.2)

## 2024-04-07 LAB — URINE DRUG SCREEN, QUALITATIVE (ARMC ONLY)
Amphetamines, Ur Screen: NOT DETECTED
Barbiturates, Ur Screen: NOT DETECTED
Benzodiazepine, Ur Scrn: NOT DETECTED
Cannabinoid 50 Ng, Ur ~~LOC~~: NOT DETECTED
Cocaine Metabolite,Ur ~~LOC~~: NOT DETECTED
MDMA (Ecstasy)Ur Screen: NOT DETECTED
Methadone Scn, Ur: NOT DETECTED
Opiate, Ur Screen: POSITIVE — AB
Phencyclidine (PCP) Ur S: NOT DETECTED
Tricyclic, Ur Screen: NOT DETECTED

## 2024-04-07 LAB — URINALYSIS, ROUTINE W REFLEX MICROSCOPIC
Bilirubin Urine: NEGATIVE
Glucose, UA: NEGATIVE mg/dL
Hgb urine dipstick: NEGATIVE
Ketones, ur: NEGATIVE mg/dL
Leukocytes,Ua: NEGATIVE
Nitrite: NEGATIVE
Protein, ur: NEGATIVE mg/dL
Specific Gravity, Urine: 1.003 — ABNORMAL LOW (ref 1.005–1.030)
pH: 6 (ref 5.0–8.0)

## 2024-04-07 LAB — HEPARIN LEVEL (UNFRACTIONATED): Heparin Unfractionated: 0.38 [IU]/mL (ref 0.30–0.70)

## 2024-04-07 MED ORDER — CLOPIDOGREL BISULFATE 75 MG PO TABS: 75.0000 mg | ORAL_TABLET | Freq: Every day | ORAL | 0 refills | Status: AC

## 2024-04-07 MED ORDER — MORPHINE SULFATE (PF) 2 MG/ML IV SOLN
2.0000 mg | INTRAVENOUS | Status: DC | PRN
  Administered 2024-04-07 (×2): 2 mg via INTRAVENOUS
  Filled 2024-04-07 (×2): qty 1

## 2024-04-07 MED ORDER — IBUPROFEN 400 MG PO TABS
400.0000 mg | ORAL_TABLET | Freq: Three times a day (TID) | ORAL | Status: DC | PRN
  Filled 2024-04-07: qty 1

## 2024-04-07 NOTE — Progress Notes (Signed)
 Progress Note    04/07/2024 7:27 AM 1 Day Post-Op  Subjective:  [name] is a 68 yo female now POD #1 from:  Procedure(s) Performed:             1.  Ultrasound guidance for vascular access right femoral artery              2.  Catheter placement into SMA and the celiac artery from right femoral approach             3.  Aortogram and selective angiogram of the SMA and the celiac artery             4.  Stent to the celiac artery with 6 mm diameter x 37 mm length balloon expandable stent             5.  StarClose closure device right femoral artery  Patient is resting comfortably in bed this morning.  She endorses that her abdominal pain has gone away.  She has not eaten as of yet this morning but plans on eating breakfast later.  She has been up ambulating back and forth to the bathroom.  Patient was on heparin  infusion and is now being converted to aspirin  81 mg daily, Plavix  75 mg daily and Lipitor  80 mg daily.  No complaints overnight.  Vitals all remained stable.   Vitals:   04/07/24 0007 04/07/24 0453  BP: 125/69 132/61  Pulse: 64 62  Resp: 18 17  Temp: 98.1 F (36.7 C) 98.4 F (36.9 C)  SpO2: 98% 96%   Physical Exam: Cardiac:  RRR, normal S1 and S2.  No murmurs Lungs: Lungs are clear on auscultation.  Nonlabored breathing.  No rales rhonchi or wheezing. Incisions: Right groin incision with dressing clean dry and intact.  No hematoma seroma to note. Extremities: All extremities warm to touch with palpable pulses. Abdomen: Positive bowel sounds on auscultation.  Nontender nondistended.  No periumbilical pain at this point in time. Neurologic: Alert and oriented x 3, answers all questions and follows commands appropriately.  CBC    Component Value Date/Time   WBC 6.4 04/07/2024 0602   RBC 2.63 (L) 04/07/2024 0602   HGB 8.6 (L) 04/07/2024 0602   HGB 12.7 05/28/2022 1103   HCT 27.0 (L) 04/07/2024 0602   HCT 36.6 05/28/2022 1103   PLT 230 04/07/2024 0602   PLT 151  05/28/2022 1103   MCV 102.7 (H) 04/07/2024 0602   MCV 96 05/28/2022 1103   MCV 88 10/15/2013 1123   MCH 32.7 04/07/2024 0602   MCHC 31.9 04/07/2024 0602   RDW 13.4 04/07/2024 0602   RDW 12.9 05/28/2022 1103   RDW 14.9 (H) 10/15/2013 1123   LYMPHSABS 1.7 05/05/2023 1748   LYMPHSABS 1.5 05/28/2022 1103   LYMPHSABS 0.9 (L) 10/15/2013 1123   MONOABS 0.3 05/05/2023 1748   MONOABS 0.4 10/15/2013 1123   EOSABS 0.1 05/05/2023 1748   EOSABS 0.0 05/28/2022 1103   EOSABS 0.0 10/15/2013 1123   BASOSABS 0.0 05/05/2023 1748   BASOSABS 0.0 05/28/2022 1103   BASOSABS 0.0 10/15/2013 1123    BMET    Component Value Date/Time   NA 136 04/06/2024 1201   NA 140 05/28/2022 1103   NA 131 (L) 10/15/2013 1123   K 3.5 04/06/2024 1201   K 3.6 10/15/2013 1123   CL 109 04/06/2024 1201   CL 102 10/15/2013 1123   CO2 20 (L) 04/06/2024 1201   CO2 24 10/15/2013 1123   GLUCOSE 82  04/06/2024 1201   GLUCOSE 111 (H) 10/15/2013 1123   BUN 13 04/06/2024 1201   BUN 13 05/28/2022 1103   BUN 12 10/15/2013 1123   CREATININE 1.08 (H) 04/06/2024 1201   CREATININE 1.20 10/15/2013 1123   CALCIUM  8.6 (L) 04/06/2024 1201   CALCIUM  9.1 10/15/2013 1123   GFRNONAA 56 (L) 04/06/2024 1201   GFRNONAA 50 (L) 10/15/2013 1123   GFRAA >60 03/02/2020 1232   GFRAA 58 (L) 10/15/2013 1123    INR    Component Value Date/Time   INR 1.1 08/05/2021 1533     Intake/Output Summary (Last 24 hours) at 04/07/2024 0727 Last data filed at 04/07/2024 0415 Gross per 24 hour  Intake 2771.21 ml  Output --  Net 2771.21 ml     Assessment/Plan:  68 y.o. female is s/p SEE ABOVE 1 Day Post-Op   PLAN Patient to be converted off of heparin  infusion to aspirin  81 mg daily and Plavix  75 mg daily and Lipitor  80 mg daily. If patient eats without any nausea vomiting diarrhea or any increased abdominal pain okay per vascular surgery if patient be discharged home on the above-stated medications. Patient to follow-up with vein and vascular  surgery as scheduled in the office with mesenteric ultrasounds  DVT prophylaxis: ASA 81 mg daily, Plavix  75 mg daily.   Gwendlyn JONELLE [facility]ank [facility] 04/07/2024 7:27 AM

## 2024-04-07 NOTE — Progress Notes (Signed)
 PROGRESS NOTE  [name]    DOB: 1956-05-10, 68 y.o.  FMW:[id number]    Code Status: Full Code   DOA: 04/06/2024   LOS: 1   Brief hospital course  [name] is a 68 y.o. female with medical history significant for Depression, hypertension, hyperlipidemia, COPD, CAD, PVD, right lung cancer s/p surgery, tobacco use disorder being admitted for worsening symptoms of chronic mesenteric ischemia. In the ED, BP 189/92 with otherwise normal vitals. Labs notable for normal WBC of 6.0 with lactic acid 2.0.  Hemoglobin 11.2 and creatinine1.2, potassium 3.3. A wet prep was done that showed trichomoniasis and WBCs EKG showed sinus at 69 CT angio showed the following: 1. Severe narrowing at the origin of the celiac axis and inferior mesenteric artery. 2. 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 3. Mucosal hyper-enhancement of the small bowel, possibly related to reperfusion in the setting of chronic mesenteric ischemia   Patient treated with morphine , Zofran  and NS bolus and started on a heparin  infusion. She was also given metronidazole  for abnormal wet prep.  Vascular surgery was consulted and performed PCI for SMA occlusion and celiac artery stenosis 8/4. She tolerated the procedure well, had normal BM after and denied any abdominal pain with eating today. She was discontinued from heparin  gtt and continued on aspirin  and plavix . She then stated she had some abdominal pain at lunch time so will back down diet and continue to watch.   Assessment & Plan  Principal Problem:   Stenosis of inferior mesenteric artery (HCC) Active Problems:   Coronary artery disease due to lipid rich plaque   PAD (peripheral artery disease) (HCC)   Tobacco use disorder   COPD (chronic obstructive pulmonary disease) (HCC)   Essential hypertension   Depression   Trichomoniasis   Malignant neoplasm of upper lobe of right  lung (HCC)   Periumbilical abdominal pain   Mesenteric ischemia (HCC)  Acute on chronic mesenteric ischemia (HCC) Heparin  infusion discontinued as patient was eating well and having normal BM without abdominal pain. Vascular surgery recommends to continue aspirin  and plavix .  - PRN pain control - back down to clear liquid diet.  - vascular following, appreciate your recs   Coronary artery disease due to lipid rich plaque  PAD Continue atorvastatin , ezetimibe , losartan  and metoprolol    Tobacco use disorder Nicotine  patch   COPD (chronic obstructive pulmonary disease) (HCC) Not acutely exacerbated DuoNeb as needed   Essential hypertension Continue home losartan  and metoprolol    Malignant neoplasm of upper lobe of right lung [place]) S/p right upper lobe posterior segmentectomy (March 20, 2023) Followed at [facility]    Trichomoniasis Treated with metronidazole  2000 mg x 1   Depression Continue home meds  Body mass index is 18.77 kg/m.  VTE ppx: heparin  gtt> lovenox   Diet:     Diet   Diet 2 gram sodium Room service appropriate? Yes; Fluid consistency: Thin   Consultants: Vascular surgery   Subjective 04/07/24    Pt reports feeling well. Denies abdominal pain. She had breakfast and tolerated well. Endorses 2 bowel movements since yesterday. The first one was black and second was light brown/green.    Objective  Blood pressure 132/61, pulse 62, temperature 98.4 F (36.9 C), temperature source Oral, resp. rate 17, height 5' 4 (1.626 m), weight 49.6 kg, SpO2 96%.  Intake/Output Summary (Last 24 hours) at 04/07/2024 0659 Last data filed at 04/07/2024 0415 Gross per 24 hour  Intake  2771.21 ml  Output --  Net 2771.21 ml   Filed Weights   04/06/24 1439 04/06/24 2127  Weight: 50.8 kg 49.6 kg    Physical Exam:  General: awake, alert, NAD HEENT: atraumatic, clear conjunctiva, anicteric sclera, MMM, hearing grossly normal Respiratory: normal respiratory  effort. Cardiovascular: quick capillary refill, normal S1/S2, RRR, no JVD, murmurs Gastrointestinal: soft, NT, ND Nervous: A&O x3. no gross focal neurologic deficits, normal speech Extremities: moves all equally, no edema, normal tone Skin: dry, intact, normal temperature, normal color. No rashes, lesions or ulcers on exposed skin Psychiatry: normal mood, congruent affect  Labs   I have personally reviewed the following labs and imaging studies CBC    Component Value Date/Time   WBC 6.4 04/07/2024 0602   RBC 2.63 (L) 04/07/2024 0602   HGB 8.6 (L) 04/07/2024 0602   HGB 12.7 05/28/2022 1103   HCT 27.0 (L) 04/07/2024 0602   HCT 36.6 05/28/2022 1103   PLT 230 04/07/2024 0602   PLT 151 05/28/2022 1103   MCV 102.7 (H) 04/07/2024 0602   MCV 96 05/28/2022 1103   MCV 88 10/15/2013 1123   MCH 32.7 04/07/2024 0602   MCHC 31.9 04/07/2024 0602   RDW 13.4 04/07/2024 0602   RDW 12.9 05/28/2022 1103   RDW 14.9 (H) 10/15/2013 1123   LYMPHSABS 1.7 05/05/2023 1748   LYMPHSABS 1.5 05/28/2022 1103   LYMPHSABS 0.9 (L) 10/15/2013 1123   MONOABS 0.3 05/05/2023 1748   MONOABS 0.4 10/15/2013 1123   EOSABS 0.1 05/05/2023 1748   EOSABS 0.0 05/28/2022 1103   EOSABS 0.0 10/15/2013 1123   BASOSABS 0.0 05/05/2023 1748   BASOSABS 0.0 05/28/2022 1103   BASOSABS 0.0 10/15/2013 1123      Latest Ref Rng & Units 04/06/2024   12:01 PM 04/05/2024    8:53 PM 04/03/2024   12:02 PM  BMP  Glucose 70 - 99 mg/dL 82  895  86   BUN 8 - 23 mg/dL 13  16  21    Creatinine 0.44 - 1.00 mg/dL 8.91  8.79  8.17   Sodium 135 - 145 mmol/L 136  137  136   Potassium 3.5 - 5.1 mmol/L 3.5  3.3  3.2   Chloride 98 - 111 mmol/L 109  103  101   CO2 22 - 32 mmol/L 20  22  20    Calcium  8.9 - 10.3 mg/dL 8.6  89.7  89.8     PERIPHERAL VASCULAR CATHETERIZATION Result Date: 04/06/2024 See surgical note for result.  CT Angio Abd/Pel W and/or Wo Contrast Result Date: 04/06/2024 EXAM: CTA ABDOMEN AND PELVIS WITH AND WITHOUT CONTRAST  04/06/2024 01:53:13 AM TECHNIQUE: CTA images of the abdomen and pelvis with and without intravenous contrast. Three-dimensional MIP/volume rendered formations were performed. Automated exposure control, iterative reconstruction, and/or weight based adjustment of the mA/kV was utilized to reduce the radiation dose to as low as reasonably achievable. COMPARISON: None available. CLINICAL HISTORY: Mesenteric ischemia, acute; Mesenteric ischemia, chronic. Pt to ed from home via [facility] for abd pain. Just seen Friday here for same. All vitals WNL. Pt is caox4, in no acute distress and ambulatory on scene fro EMS. FINDINGS: VASCULATURE: Advanced mixed density atherosclerotic plaque in the aorta and its mesenteric, renal, and iliac artery branches. Moderate narrowing of the splenic artery. AORTA: No acute finding. No abdominal aortic aneurysm. No dissection. CELIAC TRUNK: Severe narrowing at the origin of the celiac axis. SUPERIOR MESENTERIC ARTERY: Occlusion of the proximal SMA. Extensive atherosclerotic calcification involving the mid and  distal superior mesenteric artery. INFERIOR MESENTERIC ARTERY: Severe narrowing at the origin. RENAL ARTERIES: Severe narrowing of the left renal artery. There is an accessory left renal artery. Moderate narrowing of the right renal artery near the origin. ILIAC ARTERIES: Moderate narrowing of the left external and internal iliac arteries. Severe narrowing of the proximal right internal iliac artery. Moderate narrowing of the right external iliac artery. Probable changes of endarterectomy in the left superficial  femoral artery. LIVER: Patent portal vein. No acute abnormality. GALLBLADDER AND BILE DUCTS: Cholecystectomy. SPLEEN: The spleen is unremarkable. PANCREAS: Pancreatic divisum. ADRENAL GLANDS: Bilateral adrenal glands demonstrate no acute abnormality. KIDNEYS, URETERS AND BLADDER: The bladder is not distended. GI AND BOWEL: No bowel obstruction. Mild distention of the stomach. No  bowel wall thickening. Mucosal hyper-enhancement of the small bowel. This can be a normal finding seen with late arterial phase enhancement however given significant narrowing in all 3 mesenteric arteries, hyperemia secondary to reperfusion in the setting of chronic mesenteric ischemia should be considered. REPRODUCTIVE: Hysterectomy. PERITONEUM AND RETRPERITONEUM: No ascites or free air. LUNG BASE: No acute abnormality. LYMPH NODES: No lymphadenopathy. BONES AND SOFT TISSUES: No acute abnormality of the bones. No acute soft tissue abnormality. IMPRESSION: 1. Severe narrowing at the origin of the celiac axis and inferior mesenteric artery. 2. 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 3. Mucosal hyper-enhancement of the small bowel, possibly related to reperfusion in the setting of chronic mesenteric ischemia. Electronically signed by: Norman Gatlin MD 04/06/2024 02:21 AM EDT RP Workstation: [id number]   Disposition Plan & Communication  Patient status: Inpatient  Admitted From: Home Planned disposition location: Home Anticipated discharge date: 8/6 pending clinical stability   Family Communication: none at bedside    Author: Marien LITTIE Piety, DO [facility] 04/07/2024, 6:59 AM   Available by Epic secure chat 7AM-7PM. If 7PM-7AM, please contact night-coverage.  [facility] contact information found on [email or web address].

## 2024-04-07 NOTE — Care Management CC44 (Cosign Needed)
 Condition Code 44 Documentation Completed  Patient Details  Name: Jasiyah Poland MRN: [id number] Date of Birth: [birth year]   Condition Code 44 given:  Yes Patient signature on Condition Code 44 notice:  Yes Documentation of 2 MD's agreement:  Yes Code 44 added to claim:  Yes    Dalia GORMAN Fuse, RN 04/07/2024, 2:40 PM

## 2024-04-07 NOTE — Plan of Care (Signed)

## 2024-04-07 NOTE — Progress Notes (Signed)
 PHARMACY - ANTICOAGULATION CONSULT NOTE  Pharmacy Consult for Heparin   Indication: chronic mesenteric ischemia   Allergies  Allergen Reactions   Aspirin  Nausea And Vomiting and Other (See Comments)    Pt states aspirin  makes her cramp and have to use coated kind.   Zyrtec [Cetirizine] Other (See Comments)    Upset stomach   Tramadol Rash    Patient Measurements: Height: 5' 4 (162.6 cm) Weight: 49.6 kg (109 lb 5.6 oz) IBW/kg (Calculated) : 54.7 HEPARIN  DW (KG): 49.6  Vital Signs: Temp: 98.4 F (36.9 C) (08/05 0453) Temp Source: Oral (08/05 0453) BP: 132/61 (08/05 0453) Pulse Rate: 62 (08/05 0453)  Labs: Recent Labs    04/05/24 2053 04/06/24 9367 04/06/24 1201 04/06/24 2233 04/07/24 0602  HGB 11.2*  --  8.9* 9.2* 8.6*  HCT 34.0*  --  27.8*  --  27.0*  PLT 301  --  224  --  230  HEPARINUNFRC  --    < > 0.28* 0.59 0.38  CREATININE 1.20*  --  1.08*  --   --    < > = values in this interval not displayed.    Estimated Creatinine Clearance: 39 mL/min (A) (by C-G formula based on SCr of 1.08 mg/dL (H)).   Medical History: Past Medical History:  Diagnosis Date   Adenocarcinoma of upper lobe of right lung (HCC)    a.) s/p posterior segmentectomy on 03/20/23 at [facility]   Anemia    Arthritis    Asthma    Carotid artery stenosis    a.) doppler 05/14/2017: 1-39% BICA; b.) doppler 04/05/2018: 50-69% BICA; c.) doppler 06/12/2019: 40-59% BICA; d.) doppler 06/14/2020, 06/14/2021: 1-39% BICA; e.) doppler 06/19/2022: 1.39% RICA, 40-59% LICA   Colon polyp    COPD (chronic obstructive pulmonary disease) (HCC)    Coronary artery disease 04/03/2006   a.) LHC 04/03/2006: 25% mLM, 25% mLAD, 50% D1, 25/50% mLCx, 50% dLCx, 25% OM2, 100% pRCA --> med mgmt; b.) LHC 04/25/2010: 25% pLM, 25% pLAD, 50% pLCx, 70% dLCx, 100% pRCA --> med mgmt   Depression    Diastolic dysfunction    a.) TTE 04/05/2018: EF 65%, no RWMAs, G1DD, triv MR/TR; b.) TTE 10/15/2022: EF 88%, mod LVH, G1DD, triv TR, mild  MR; c.) TTE 02/27/2023: EF 65%, no RWMAs, triv TR/MR, RVSP 35; d.) TTE 03/21/2023: EF 65%, no RWMAs, norm RVSF, PASP 44   Environmental allergies    Headache    Hyperlipidemia    Hypertension    Hypoxic respiratory failure (HCC)    Internal hemorrhoids    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[facility])    a.) thinks it was in 2007 --> LHC 04/03/2006 revealed a 100% pRCA --> medically managed   Necrotizing pneumonia (HCC)    Pulmonary HTN (HCC)    a.) TTE 02/27/2023: RVSP 35 mmHg; b.) TTE 03/21/2023: PASP 44 mmHg   PVD (peripheral vascular disease) with claudication (HCC)    Shortness of breath dyspnea    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   Unknown   albuterol  (VENTOLIN  HFA) 108 (90 Base) MCG/ACT inhaler Inhale 2 puffs into the lungs every 4 (four) hours as needed for wheezing or shortness of breath. 9 g 1 Unknown   aspirin  EC 81 MG EC tablet Take 1 tablet (81 mg total) by mouth daily. 90 tablet 1 Past Week   atorvastatin  (LIPITOR ) 80 MG tablet Take 1 tablet (80 mg total) by mouth daily. 90 tablet 3 Past Week   budesonide -formoterol  (SYMBICORT ) 80-4.5 MCG/ACT inhaler INHALE 2 PUFFS ONCE DAILY 22 g 2 Past Week   cephALEXin  (KEFLEX ) 500 MG capsule Take 1 capsule (500 mg total) by mouth 2 (two) times daily for 7 days. 14 capsule 0 Past Week   Cholecalciferol  (VITAMIN D -3 PO) Take 1 tablet by mouth daily.   Past Week   cyclobenzaprine  (FLEXERIL ) 5 MG tablet Take 1 tablet (5 mg total) by mouth at bedtime as needed for muscle spasms. 30 tablet 3 Unknown    ezetimibe  (ZETIA ) 10 MG tablet Take 1 tablet (10 mg total) by mouth daily. 90 tablet 3 Past Week   ferrous sulfate  325 (65 FE) MG tablet Take 325 mg by mouth daily.   Past Week   gabapentin  (NEURONTIN ) 100 MG capsule Take 1 capsule (100 mg total) by mouth 3 (three) times daily. 90 capsule 0 Past Week   losartan  (COZAAR ) 50 MG tablet Take 2 tablets (100 mg total) by mouth daily. 180 tablet 3 Past Week   metoprolol  succinate (TOPROL -XL) 50 MG 24 hr tablet TAKE 1 TABLET BY MOUTH ONCE DAILY WITH  OR  IMMEDIATELY  FOLLOWING  A  MEAL 90 tablet 0 Past Week   ondansetron  (ZOFRAN -ODT) 4 MG disintegrating tablet Take 1 tablet (4 mg total) by mouth every 8 (eight) hours as needed for nausea or vomiting. 12 tablet 0 Past Week   thiamine  (VITAMIN B-1) 100 MG tablet Take 1 tablet (100 mg total) by mouth daily. 15 tablet 0 Past Week   nicotine  (NICODERM CQ  - DOSED IN MG/24 HOURS) 14 mg/24hr patch Place 1 patch (14 mg total) onto the skin daily. (Patient not taking: Reported on 01/17/2024) 28 patch 0     Assessment: Pharmacy consulted to dose heparin  in this 68 year old female admitted with chronic mesenteric ischemia.  No prior anticoag noted. Hgb trending down, possibly hemodilutional.    CTA:  Severe narrowing at the origin of the celiac axis and inferior mesenteric artery. 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 Mucosal hyper-enhancement of the small bowel, possibly related to reperfusion in the setting of chronic mesenteric ischemia.  8/4 0632 HL 0.42 - drawn 3 hours after it was started  8/4 1201 HL 0.28 8/4 2220 Lower Goal Range per Dr. Cleatus d/t S&S of some rectal bleeding 8/4 2233 HL 0.59, supratherapeutic / Hgb 9.2 8/5 0602 HL 0.38, therapeutic x 1 / Hbg 8.6   Goal of Therapy:  Heparin  level 0.3-0.5 units/ml Monitor platelets by anticoagulation protocol: Yes   Plan:  Will continue the heparin  infusion  at 650 units/hr. Recheck heparin  level in 6 hours to confirm. CBC daily while on heparin .   Rankin CANDIE Dills, PharmD, [facility] 04/07/2024 6:52 AM

## 2024-04-07 NOTE — Discharge Instructions (Signed)
 Please continue to take your aspirin  every day as well as plavix  which will help to keep your blood vessels open.  Smoking is known to worsen your condition so please avoid smoking as possible.  Follow up with vascular surgery in 6 weeks or sooner if experiencing problems. Reading material is provided to review signs and symptoms to watch for

## 2024-04-08 ENCOUNTER — Observation Stay

## 2024-04-08 DIAGNOSIS — K56609 Unspecified intestinal obstruction, unspecified as to partial versus complete obstruction: Secondary | ICD-10-CM | POA: Diagnosis not present

## 2024-04-08 DIAGNOSIS — R140 Abdominal distension (gaseous): Secondary | ICD-10-CM | POA: Diagnosis not present

## 2024-04-08 DIAGNOSIS — K551 Chronic vascular disorders of intestine: Secondary | ICD-10-CM | POA: Diagnosis not present

## 2024-04-08 DIAGNOSIS — R1033 Periumbilical pain: Secondary | ICD-10-CM | POA: Diagnosis not present

## 2024-04-08 DIAGNOSIS — I7090 Unspecified atherosclerosis: Secondary | ICD-10-CM | POA: Diagnosis not present

## 2024-04-08 LAB — BASIC METABOLIC PANEL WITH GFR
Anion gap: 7 (ref 5–15)
BUN: <5 mg/dL — ABNORMAL LOW (ref 8–23)
CO2: 21 mmol/L — ABNORMAL LOW (ref 22–32)
Calcium: 8.6 mg/dL — ABNORMAL LOW (ref 8.9–10.3)
Chloride: 109 mmol/L (ref 98–111)
Creatinine, Ser: 0.86 mg/dL (ref 0.44–1.00)
GFR, Estimated: >60 mL/min (ref >60)
Glucose, Bld: 88 mg/dL (ref 70–99)
Potassium: 3.7 mmol/L (ref 3.5–5.1)
Sodium: 137 mmol/L (ref 135–145)

## 2024-04-08 LAB — CBC
HCT: 25.5 % — ABNORMAL LOW (ref 36.0–46.0)
Hemoglobin: 8.7 g/dL — ABNORMAL LOW (ref 12.0–15.0)
MCH: 33.3 pg (ref 26.0–34.0)
MCHC: 34.1 g/dL (ref 30.0–36.0)
MCV: 97.7 fL (ref 80.0–100.0)
Platelets: 201 K/uL (ref 150–400)
RBC: 2.61 MIL/uL — ABNORMAL LOW (ref 3.87–5.11)
RDW: 13.2 % (ref 11.5–15.5)
WBC: 4.6 K/uL (ref 4.0–10.5)
nRBC: 0 % (ref 0.0–0.2)

## 2024-04-08 MED ORDER — MELATONIN 5 MG PO TABS
5.0000 mg | ORAL_TABLET | Freq: Every evening | ORAL | Status: DC | PRN
  Administered 2024-04-08 – 2024-04-09 (×2): 5 mg via ORAL
  Filled 2024-04-08 (×2): qty 1

## 2024-04-08 NOTE — Progress Notes (Addendum)
 PROGRESS NOTE  [name]    DOB: March 16, 1956, 68 y.o.  FMW:[id number]    Code Status: Full Code   DOA: 04/06/2024   LOS: 1   Brief hospital course  [name] is a 68 y.o. female with medical history significant for Depression, hypertension, hyperlipidemia, COPD, CAD, PVD, right lung cancer s/p surgery, tobacco use disorder being admitted for worsening symptoms of chronic mesenteric ischemia. In the ED, BP 189/92 with otherwise normal vitals. Labs notable for normal WBC of 6.0 with lactic acid 2.0.  Hemoglobin 11.2 and creatinine1.2, potassium 3.3. A wet prep was done that showed trichomoniasis and WBCs EKG showed sinus at 69 CT angio showed the following: 1. Severe narrowing at the origin of the celiac axis and inferior mesenteric artery. 2. 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 3. Mucosal hyper-enhancement of the small bowel, possibly related to reperfusion in the setting of chronic mesenteric ischemia   Patient treated with morphine , Zofran  and NS bolus and started on a heparin  infusion. She was also given metronidazole  for abnormal wet prep.  Vascular surgery was consulted and performed PCI for SMA occlusion and celiac artery stenosis 8/4, treated with stent placement Patient had abdominal pain and emesis on 08/05, diet changed to clear liquid.  She was discontinued from heparin  gtt and continued on aspirin  and plavix .   Assessment & Plan  Principal Problem:   Stenosis of inferior mesenteric artery (HCC) Active Problems:   Coronary artery disease due to lipid rich plaque   PAD (peripheral artery disease) (HCC)   Tobacco use disorder   COPD (chronic obstructive pulmonary disease) (HCC)   Essential hypertension   Depression   Trichomoniasis   Malignant neoplasm of upper lobe of right lung (HCC)   Periumbilical abdominal pain   Mesenteric ischemia (HCC)   Stenosis of celiac artery  (HCC)   Celiac artery stenosis (HCC)  Acute on chronic mesenteric ischemia (HCC) Heparin  infusion discontinued as patient was eating well and having normal BM without abdominal pain. Vascular surgery recommends to continue aspirin , plavix , lipitor , Zetia  - PRN pain control - Xray pending to rule out bowel obstruction, continues to be on clear liquid diet but BS not present - On clear liquid diet, IV fluids - vascular following, appreciate recs, follow up outpatient at discharge  Likely/intermittent obstruction versus ileus - XR nonspecific gaseous distention of small bowel, reflect early/intermittent obstruction vs ileus.  - NPO, IV fluids - Trial of NGT tomorrow if symptoms dont self resolve. Denies nausea, abdominal pain   Coronary artery disease due to lipid rich plaque  PAD Continue atorvastatin , ezetimibe , losartan  and metoprolol    Tobacco use disorder Nicotine  patch   COPD (chronic obstructive pulmonary disease) (HCC) Not acutely exacerbated DuoNeb as needed   Essential hypertension Continue home losartan  and metoprolol    Malignant neoplasm of upper lobe of right lung [facility]) S/p right upper lobe posterior segmentectomy (March 20, 2023) [facility]ed at [facility]    Trichomoniasis Treated with metronidazole  2000 mg x 1   Depression Continue home meds  Body mass index is 18.77 kg/m.  VTE ppx: heparin  gtt> lovenox   Diet:     Diet   Diet clear liquid Room service appropriate? Yes; Fluid consistency: Thin   Consultants: Vascular surgery   Subjective 04/08/24    Pt reports feeling well. Denies abdominal pain.  Reports having intermittent pain, on clear  liquid diet.  Reports pain has been improving, but on IV opioids, but not  passing gas.  Discussed with vascular, will get x-ray to r/o bowel obstruction/ileus   Objective  Blood pressure 132/61, pulse 62, temperature 98.4 F (36.9 C), temperature source Oral, resp. rate 17, height 5' 4 (1.626 m), weight 49.6 kg, SpO2  96%.  Intake/Output Summary (Last 24 hours) at 04/08/2024 0737 Last data filed at 04/07/2024 1700 Gross per 24 hour  Intake 990 ml  Output --  Net 990 ml   Filed Weights   04/06/24 1439 04/06/24 2127  Weight: 50.8 kg 49.6 kg    Physical Exam:  General: awake, alert, NAD HEENT: atraumatic, clear conjunctiva, anicteric sclera, MMM, hearing grossly normal Respiratory: normal respiratory effort. Cardiovascular: quick capillary refill, normal S1/S2, RRR, no JVD, murmurs Gastrointestinal: soft, NT, ND, BS not present Nervous: A&O x3. no gross focal neurologic deficits, normal speech Extremities: moves all equally, no edema, normal tone Skin: dry, intact, normal temperature, normal color. No rashes, lesions or ulcers on exposed skin Psychiatry: normal mood, congruent affect  Labs   I have personally reviewed the following labs and imaging studies  CBC    Component Value Date/Time   WBC 4.6 04/08/2024 0427   RBC 2.61 (L) 04/08/2024 0427   HGB 8.7 (L) 04/08/2024 0427   HGB 12.7 05/28/2022 1103   HCT 25.5 (L) 04/08/2024 0427   HCT 36.6 05/28/2022 1103   PLT 201 04/08/2024 0427   PLT 151 05/28/2022 1103   MCV 97.7 04/08/2024 0427   MCV 96 05/28/2022 1103   MCV 88 10/15/2013 1123   MCH 33.3 04/08/2024 0427   MCHC 34.1 04/08/2024 0427   RDW 13.2 04/08/2024 0427   RDW 12.9 05/28/2022 1103   RDW 14.9 (H) 10/15/2013 1123   LYMPHSABS 1.7 05/05/2023 1748   LYMPHSABS 1.5 05/28/2022 1103   LYMPHSABS 0.9 (L) 10/15/2013 1123   MONOABS 0.3 05/05/2023 1748   MONOABS 0.4 10/15/2013 1123   EOSABS 0.1 05/05/2023 1748   EOSABS 0.0 05/28/2022 1103   EOSABS 0.0 10/15/2013 1123   BASOSABS 0.0 05/05/2023 1748   BASOSABS 0.0 05/28/2022 1103   BASOSABS 0.0 10/15/2013 1123      Latest Ref Rng & Units 04/08/2024    4:27 AM 04/06/2024   12:01 PM 04/05/2024    8:53 PM  BMP  Glucose 70 - 99 mg/dL 88  82  895   BUN 8 - 23 mg/dL 5  13  16    Creatinine 0.44 - 1.00 mg/dL 9.13  8.91  8.79   Sodium 135  - 145 mmol/L 137  136  137   Potassium 3.5 - 5.1 mmol/L 3.7  3.5  3.3   Chloride 98 - 111 mmol/L 109  109  103   CO2 22 - 32 mmol/L 21  20  22    Calcium  8.9 - 10.3 mg/dL 8.6  8.6  89.7     PERIPHERAL VASCULAR CATHETERIZATION Result Date: 04/06/2024 See surgical note for result.  Disposition Plan & Communication  Patient status: Observation  Admitted From: Home Planned disposition location: Home Anticipated discharge date: 8/7 pending clinical stability   Family Communication: updated niece at the bedside   Author: Laree Lock, MD [facility] 04/08/2024, 7:37 AM   Available by Epic secure chat 7AM-7PM. If 7PM-7AM, please contact night-coverage.  [facility] contact information found on [email or web address].

## 2024-04-08 NOTE — Progress Notes (Signed)
 Progress Note    04/08/2024 8:43 AM 2 Days Post-Op  Subjective:   Mikea Quadros is a 68 yo female now POD #1 from:   Procedure(s) Performed:             1.  Ultrasound guidance for vascular access right femoral artery              2.  Catheter placement into SMA and the celiac artery from right femoral approach             3.  Aortogram and selective angiogram of the SMA and the celiac artery             4.  Stent to the celiac artery with 6 mm diameter x 37 mm length balloon expandable stent             5.  StarClose closure device right femoral artery   Patient is resting comfortably in bed this morning.  She endorses that her abdominal pain has gone away.  She has not eaten as of yet this morning but plans on eating breakfast later.  She has been up ambulating back and forth to the bathroom.  Patient was on heparin  infusion and is now being converted to aspirin  81 mg daily, Plavix  75 mg daily and Lipitor  80 mg daily.  No complaints overnight.  Vitals all remained stable.   Vitals:   04/08/24 0436 04/08/24 0803  BP: 139/80 (!) 145/72  Pulse: 65 61  Resp: 16 16  Temp: 98.1 F (36.7 C) 98.7 F (37.1 C)  SpO2: 98% 99%   Physical Exam: Cardiac:  RRR, normal S1 and S2.  No murmurs Lungs: Lungs are clear on auscultation.  Nonlabored breathing.  No rales rhonchi or wheezing. Incisions: Right groin incision with dressing clean dry and intact.  No hematoma seroma to note. Extremities: All extremities warm to touch with palpable pulses. Abdomen: No bowel sounds on auscultation this morning. Positive tenderness to her left upper quadrant.  Slight distension.  No periumbilical pain at this point in time. Neurologic: Alert and oriented x 3, answers all questions and follows commands appropriately.  CBC    Component Value Date/Time   WBC 4.6 04/08/2024 0427   RBC 2.61 (L) 04/08/2024 0427   HGB 8.7 (L) 04/08/2024 0427   HGB 12.7 05/28/2022 1103   HCT 25.5 (L) 04/08/2024 0427   HCT  36.6 05/28/2022 1103   PLT 201 04/08/2024 0427   PLT 151 05/28/2022 1103   MCV 97.7 04/08/2024 0427   MCV 96 05/28/2022 1103   MCV 88 10/15/2013 1123   MCH 33.3 04/08/2024 0427   MCHC 34.1 04/08/2024 0427   RDW 13.2 04/08/2024 0427   RDW 12.9 05/28/2022 1103   RDW 14.9 (H) 10/15/2013 1123   LYMPHSABS 1.7 05/05/2023 1748   LYMPHSABS 1.5 05/28/2022 1103   LYMPHSABS 0.9 (L) 10/15/2013 1123   MONOABS 0.3 05/05/2023 1748   MONOABS 0.4 10/15/2013 1123   EOSABS 0.1 05/05/2023 1748   EOSABS 0.0 05/28/2022 1103   EOSABS 0.0 10/15/2013 1123   BASOSABS 0.0 05/05/2023 1748   BASOSABS 0.0 05/28/2022 1103   BASOSABS 0.0 10/15/2013 1123    BMET    Component Value Date/Time   NA 137 04/08/2024 0427   NA 140 05/28/2022 1103   NA 131 (L) 10/15/2013 1123   K 3.7 04/08/2024 0427   K 3.6 10/15/2013 1123   CL 109 04/08/2024 0427   CL 102 10/15/2013 1123   CO2 21 (L)  04/08/2024 0427   CO2 24 10/15/2013 1123   GLUCOSE 88 04/08/2024 0427   GLUCOSE 111 (H) 10/15/2013 1123   BUN <5 (L) 04/08/2024 0427   BUN 13 05/28/2022 1103   BUN 12 10/15/2013 1123   CREATININE 0.86 04/08/2024 0427   CREATININE 1.20 10/15/2013 1123   CALCIUM  8.6 (L) 04/08/2024 0427   CALCIUM  9.1 10/15/2013 1123   GFRNONAA >60 04/08/2024 0427   GFRNONAA 50 (L) 10/15/2013 1123   GFRAA >60 03/02/2020 1232   GFRAA 58 (L) 10/15/2013 1123    INR    Component Value Date/Time   INR 1.1 08/05/2021 1533     Intake/Output Summary (Last 24 hours) at 04/08/2024 0843 Last data filed at 04/07/2024 1700 Gross per 24 hour  Intake 990 ml  Output --  Net 990 ml     Assessment/Plan:  68 y.o. female is s/p SEE ABOVE  2 Days Post-Op   PLAN Patient had pain and vomiting after eating dinner yesterday and was changed from regular diet back to clear liquid diet overnight.  This morning on exam she claims that the pain is better but she continues to get IV morphine  for pain.  I could not appreciate any bowel sounds this morning and she  had slight distention with left upper quadrant pain.  This is concerning for possible bowel obstruction/bowel ileus.  Vascular surgery recommends KUB to rule out.  Will keep patient on clear liquid diet at this time.  Hospitalist team was made aware.  Patient to remain on aspirin  81 mg daily and Plavix  75 mg daily with her Lipitor  80 mg daily.   DVT prophylaxis:  ASA 81 mg daily, Plavix  75 mg daily.    Gwendlyn JONELLE Shank [facility] 04/08/2024 8:43 AM

## 2024-04-08 NOTE — Care Management Obs Status (Signed)
 MEDICARE OBSERVATION STATUS NOTIFICATION   Patient Details  Name: Yitzel Shasteen MRN: [id number] Date of Birth: 12-07-1955   Medicare Observation Status Notification Given:  Other (see comment) (Signed off for case manager)    Rojelio SHAUNNA Rattler 04/08/2024, 12:21 PM

## 2024-04-08 NOTE — Plan of Care (Signed)

## 2024-04-09 DIAGNOSIS — K567 Ileus, unspecified: Secondary | ICD-10-CM | POA: Diagnosis not present

## 2024-04-09 DIAGNOSIS — F17210 Nicotine dependence, cigarettes, uncomplicated: Secondary | ICD-10-CM | POA: Diagnosis present

## 2024-04-09 DIAGNOSIS — I2510 Atherosclerotic heart disease of native coronary artery without angina pectoris: Secondary | ICD-10-CM | POA: Diagnosis present

## 2024-04-09 DIAGNOSIS — I5032 Chronic diastolic (congestive) heart failure: Secondary | ICD-10-CM | POA: Diagnosis present

## 2024-04-09 DIAGNOSIS — I739 Peripheral vascular disease, unspecified: Secondary | ICD-10-CM | POA: Diagnosis present

## 2024-04-09 DIAGNOSIS — N182 Chronic kidney disease, stage 2 (mild): Secondary | ICD-10-CM | POA: Diagnosis present

## 2024-04-09 DIAGNOSIS — Z85118 Personal history of other malignant neoplasm of bronchus and lung: Secondary | ICD-10-CM | POA: Diagnosis not present

## 2024-04-09 DIAGNOSIS — Z79899 Other long term (current) drug therapy: Secondary | ICD-10-CM | POA: Diagnosis not present

## 2024-04-09 DIAGNOSIS — A599 Trichomoniasis, unspecified: Secondary | ICD-10-CM | POA: Diagnosis present

## 2024-04-09 DIAGNOSIS — F32A Depression, unspecified: Secondary | ICD-10-CM | POA: Diagnosis present

## 2024-04-09 DIAGNOSIS — K551 Chronic vascular disorders of intestine: Secondary | ICD-10-CM | POA: Diagnosis not present

## 2024-04-09 DIAGNOSIS — Z7951 Long term (current) use of inhaled steroids: Secondary | ICD-10-CM | POA: Diagnosis not present

## 2024-04-09 DIAGNOSIS — J4489 Other specified chronic obstructive pulmonary disease: Secondary | ICD-10-CM | POA: Diagnosis present

## 2024-04-09 DIAGNOSIS — K55059 Acute (reversible) ischemia of intestine, part and extent unspecified: Secondary | ICD-10-CM | POA: Diagnosis present

## 2024-04-09 DIAGNOSIS — R1033 Periumbilical pain: Secondary | ICD-10-CM | POA: Diagnosis not present

## 2024-04-09 DIAGNOSIS — I4891 Unspecified atrial fibrillation: Secondary | ICD-10-CM | POA: Diagnosis present

## 2024-04-09 DIAGNOSIS — I2583 Coronary atherosclerosis due to lipid rich plaque: Secondary | ICD-10-CM | POA: Diagnosis present

## 2024-04-09 DIAGNOSIS — Z7902 Long term (current) use of antithrombotics/antiplatelets: Secondary | ICD-10-CM | POA: Diagnosis not present

## 2024-04-09 DIAGNOSIS — Z885 Allergy status to narcotic agent status: Secondary | ICD-10-CM | POA: Diagnosis not present

## 2024-04-09 DIAGNOSIS — I272 Pulmonary hypertension, unspecified: Secondary | ICD-10-CM | POA: Diagnosis present

## 2024-04-09 DIAGNOSIS — I130 Hypertensive heart and chronic kidney disease with heart failure and stage 1 through stage 4 chronic kidney disease, or unspecified chronic kidney disease: Secondary | ICD-10-CM | POA: Diagnosis present

## 2024-04-09 DIAGNOSIS — Z7982 Long term (current) use of aspirin: Secondary | ICD-10-CM | POA: Diagnosis not present

## 2024-04-09 DIAGNOSIS — Z8249 Family history of ischemic heart disease and other diseases of the circulatory system: Secondary | ICD-10-CM | POA: Diagnosis not present

## 2024-04-09 DIAGNOSIS — E785 Hyperlipidemia, unspecified: Secondary | ICD-10-CM | POA: Diagnosis present

## 2024-04-09 DIAGNOSIS — N179 Acute kidney failure, unspecified: Secondary | ICD-10-CM | POA: Diagnosis present

## 2024-04-09 LAB — BASIC METABOLIC PANEL WITH GFR
Anion gap: 5 (ref 5–15)
BUN: 5 mg/dL — ABNORMAL LOW (ref 8–23)
CO2: 25 mmol/L (ref 22–32)
Calcium: 8.8 mg/dL — ABNORMAL LOW (ref 8.9–10.3)
Chloride: 109 mmol/L (ref 98–111)
Creatinine, Ser: 0.79 mg/dL (ref 0.44–1.00)
GFR, Estimated: >60 mL/min (ref >60)
Glucose, Bld: 89 mg/dL (ref 70–99)
Potassium: 4.2 mmol/L (ref 3.5–5.1)
Sodium: 139 mmol/L (ref 135–145)

## 2024-04-09 LAB — CBC
HCT: 27.4 % — ABNORMAL LOW (ref 36.0–46.0)
Hemoglobin: 9.2 g/dL — ABNORMAL LOW (ref 12.0–15.0)
MCH: 32.6 pg (ref 26.0–34.0)
MCHC: 33.6 g/dL (ref 30.0–36.0)
MCV: 97.2 fL (ref 80.0–100.0)
Platelets: 226 K/uL (ref 150–400)
RBC: 2.82 MIL/uL — ABNORMAL LOW (ref 3.87–5.11)
RDW: 13.2 % (ref 11.5–15.5)
WBC: 4.6 K/uL (ref 4.0–10.5)
nRBC: 0 % (ref 0.0–0.2)

## 2024-04-09 LAB — MAGNESIUM
Magnesium: 1 mg/dL — ABNORMAL LOW (ref 1.7–2.4)
Magnesium: 2.5 mg/dL — ABNORMAL HIGH (ref 1.7–2.4)

## 2024-04-09 MED ORDER — MAGNESIUM SULFATE 2 GM/50ML IV SOLN
2.0000 g | Freq: Once | INTRAVENOUS | Status: AC
  Administered 2024-04-09: 2 g via INTRAVENOUS
  Filled 2024-04-09: qty 50

## 2024-04-09 MED ORDER — MAGNESIUM SULFATE 2 GM/50ML IV SOLN
2.0000 g | INTRAVENOUS | Status: DC
  Administered 2024-04-09: 2 g via INTRAVENOUS
  Filled 2024-04-09: qty 50

## 2024-04-09 NOTE — Progress Notes (Signed)
 PROGRESS NOTE  Revecca Reilly    DOB: Jul 07, 1956, 68 y.o.  FMW:[id number]    Code Status: Full Code   DOA: 04/06/2024   LOS: 1   Brief hospital course  [name] is a 68 y.o. female with medical history significant for Depression, hypertension, hyperlipidemia, COPD, CAD, PVD, right lung cancer s/p surgery, tobacco use disorder admitted for acute on chronic mesenteric ischemia. Imaging shows severe narrowing at the origin of the celiac axis and inferior mesenteric artery, Occlusion of the proximal superior mesenteric artery with extensive atherosclerotic calcification involving the mid and distal SMA. Vascular surgery was consulted and performed PCI for SMA occlusion and celiac artery stenosis 8/4. She was discontinued from heparin  gtt and continued on aspirin  and plavix   Patient had abdominal pain and emesis on 08/05, diet changed to clear liquid.  Patient continued to have intermittent abdominal pain, x-ray consistent with intermittent obstruction vs ileus, improving with conservative management  Assessment & Plan  Principal Problem:   Stenosis of inferior mesenteric artery (HCC) Active Problems:   Coronary artery disease due to lipid rich plaque   PAD (peripheral artery disease) (HCC)   Tobacco use disorder   COPD (chronic obstructive pulmonary disease) (HCC)   Essential hypertension   Depression   Trichomoniasis   Malignant neoplasm of upper lobe of right lung (HCC)   Periumbilical abdominal pain   Mesenteric ischemia (HCC)   Stenosis of celiac artery (HCC)   Celiac artery stenosis (HCC)  Acute on chronic mesenteric ischemia (HCC) - s/p Heparin  infusion, Vascular surgery recommends to continue aspirin , plavix , lipitor , Zetia  - PRN pain control - advanced diet, discontinue IV fluids - vascular following, appreciate recs, follow up outpatient at discharge in 6 weeks with abd US   Likely/intermittent obstruction versus ileus - XR nonspecific gaseous distention of small bowel,  reflect early/intermittent obstruction vs ileus.  - improving with conservative management, denies abdominal pain, nausea/vomiting.  Passing gas and having BM's - ADAT, discontinue IV fluids  Severe Hypomagnesemia - Potassium normal - Replete IV Magnesium  2 gm x 2 doses - Repeat Mag   Coronary artery disease due to lipid rich plaque  PAD Continue atorvastatin , ezetimibe , losartan  and metoprolol    Tobacco use disorder Nicotine  patch   COPD (chronic obstructive pulmonary disease) (HCC) Not acutely exacerbated DuoNeb as needed   Essential hypertension Continue home losartan  and metoprolol    Malignant neoplasm of upper lobe of right lung [facility]) S/p right upper lobe posterior segmentectomy (March 20, 2023) Followed at [facility]    Trichomoniasis Treated with metronidazole  2000 mg x 1   Depression Continue home meds  --Patient able to independently walk around the room with no assistance  Body mass index is 18.77 kg/m.  VTE ppx: heparin  gtt> lovenox   Diet:     Diet   DIET SOFT Room service appropriate? Yes; Fluid consistency: Thin   Consultants: Vascular surgery   Subjective 04/09/24    Pt reports feeling well. Denies abdominal pain, nausea, vomiting.  Had multiple BMs overnight notes passing gas.  Advance diet as tolerated and replete IV magnesium  Anticipate discharge tomorrow   Objective  Blood pressure 132/61, pulse 62, temperature 98.4 F (36.9 C), temperature source Oral, resp. rate 17, height 5' 4 (1.626 m), weight 49.6 kg, SpO2 96%.  Intake/Output Summary (Last 24 hours) at 04/09/2024 1353 Last data filed at 04/09/2024 0900 Gross per 24 hour  Intake 840 ml  Output --  Net 840 ml   Filed Weights   04/06/24 1439 04/06/24 2127  Weight: 50.8  kg 49.6 kg    Physical Exam:  General: awake, alert, NAD HEENT: atraumatic, clear conjunctiva, anicteric sclera, MMM, hearing grossly normal Respiratory: normal respiratory effort. Cardiovascular: quick capillary refill,  normal S1/S2, RRR, no JVD, murmurs Gastrointestinal: soft, NT, ND, BS +  Nervous: A&O x3. no gross focal neurologic deficits, normal speech Extremities: moves all equally, no edema, normal tone Skin: dry, intact, normal temperature, normal color. No rashes, lesions or ulcers on exposed skin Psychiatry: normal mood, congruent affect  Labs   I have personally reviewed the following labs and imaging studies  CBC    Component Value Date/Time   WBC 4.6 04/09/2024 0435   RBC 2.82 (L) 04/09/2024 0435   HGB 9.2 (L) 04/09/2024 0435   HGB 12.7 05/28/2022 1103   HCT 27.4 (L) 04/09/2024 0435   HCT 36.6 05/28/2022 1103   PLT 226 04/09/2024 0435   PLT 151 05/28/2022 1103   MCV 97.2 04/09/2024 0435   MCV 96 05/28/2022 1103   MCV 88 10/15/2013 1123   MCH 32.6 04/09/2024 0435   MCHC 33.6 04/09/2024 0435   RDW 13.2 04/09/2024 0435   RDW 12.9 05/28/2022 1103   RDW 14.9 (H) 10/15/2013 1123   LYMPHSABS 1.7 05/05/2023 1748   LYMPHSABS 1.5 05/28/2022 1103   LYMPHSABS 0.9 (L) 10/15/2013 1123   MONOABS 0.3 05/05/2023 1748   MONOABS 0.4 10/15/2013 1123   EOSABS 0.1 05/05/2023 1748   EOSABS 0.0 05/28/2022 1103   EOSABS 0.0 10/15/2013 1123   BASOSABS 0.0 05/05/2023 1748   BASOSABS 0.0 05/28/2022 1103   BASOSABS 0.0 10/15/2013 1123      Latest Ref Rng & Units 04/09/2024    4:35 AM 04/08/2024    4:27 AM 04/06/2024   12:01 PM  BMP  Glucose 70 - 99 mg/dL 89  88  82   BUN 8 - 23 mg/dL 5  <5  13   Creatinine 0.44 - 1.00 mg/dL 9.20  9.13  8.91   Sodium 135 - 145 mmol/L 139  137  136   Potassium 3.5 - 5.1 mmol/L 4.2  3.7  3.5   Chloride 98 - 111 mmol/L 109  109  109   CO2 22 - 32 mmol/L 25  21  20    Calcium  8.9 - 10.3 mg/dL 8.8  8.6  8.6     DG Abd 1 View Result Date: 04/08/2024 CLINICAL DATA:  Bowel obstruction EXAM: ABDOMEN - 1 VIEW COMPARISON:  04/06/2024 FINDINGS: Two supine frontal views of the abdomen and pelvis demonstrate mild diffuse gaseous distension of the small bowel, not appreciably  changed since prior study. Minimal gas and stool throughout the colon. Diffuse atherosclerosis. New stent in the distribution of the celiac artery. No abdominal masses or abnormal calcifications. No acute bony abnormalities. IMPRESSION: 1. Nonspecific gaseous distention of the small bowel, which could reflect early/intermittent obstruction versus ileus. 2. Diffuse atherosclerosis.  New celiac artery stent. Electronically Signed   By: Ozell Daring M.D.   On: 04/08/2024 17:16   Disposition Plan & Communication  Patient status: Inpatient  Admitted From: Home Planned disposition location: Home Anticipated discharge date: 8/8 pending clinical stability   Family Communication: updated patient and friend at the bedside   Author: Laree Lock, MD [facility] 04/09/2024, 1:53 PM   Available by Epic secure chat 7AM-7PM. If 7PM-7AM, please contact night-coverage.  [facility] contact information found on [email or web address].

## 2024-04-09 NOTE — Progress Notes (Signed)
 Progress Note    04/09/2024 7:58 AM 3 Days Post-Op  Subjective:  [name] is a 68 yo female now POD #3 from:   Procedure(s) Performed:             1.  Ultrasound guidance for vascular access right femoral artery              2.  Catheter placement into SMA and the celiac artery from right femoral approach             3.  Aortogram and selective angiogram of the SMA and the celiac artery             4.  Stent to the celiac artery with 6 mm diameter x 37 mm length balloon expandable stent             5.  StarClose closure device right femoral artery   Patient is resting comfortably in bed this morning.  She endorses that her abdominal pain is better today.  She has not eaten as of yet this morning  as she has been on a clear liquid diet but plans on eating breakfast later.  She has been up ambulating back and forth to the bathroom.  Patient now on aspirin  81 mg daily, Plavix  75 mg daily and Lipitor  80 mg daily.  No complaints overnight.  Vitals all remained stable.   Vitals:   04/08/24 2139 04/09/24 0441  BP: (!) 158/70 135/80  Pulse: 65 65  Resp: 16 16  Temp: 98 F (36.7 C) 98.1 F (36.7 C)  SpO2: 99% 100%   Physical Exam: Cardiac:  RRR, normal S1 and S2.  No murmurs Lungs: Lungs are clear on auscultation.  Nonlabored breathing.  No rales rhonchi or wheezing. Incisions: Right groin incision with dressing clean dry and intact.  No hematoma seroma to note. Extremities: All extremities warm to touch with palpable pulses. Abdomen: Positive bowel sounds on auscultation this morning. No further tenderness to her left upper quadrant.  Slight distension.  No periumbilical pain at this point in time. Neurologic: Alert and oriented x 3, answers all questions and follows commands appropriately.  CBC    Component Value Date/Time   WBC 4.6 04/09/2024 0435   RBC 2.82 (L) 04/09/2024 0435   HGB 9.2 (L) 04/09/2024 0435   HGB 12.7 05/28/2022 1103   HCT 27.4 (L) 04/09/2024 0435   HCT  36.6 05/28/2022 1103   PLT 226 04/09/2024 0435   PLT 151 05/28/2022 1103   MCV 97.2 04/09/2024 0435   MCV 96 05/28/2022 1103   MCV 88 10/15/2013 1123   MCH 32.6 04/09/2024 0435   MCHC 33.6 04/09/2024 0435   RDW 13.2 04/09/2024 0435   RDW 12.9 05/28/2022 1103   RDW 14.9 (H) 10/15/2013 1123   LYMPHSABS 1.7 05/05/2023 1748   LYMPHSABS 1.5 05/28/2022 1103   LYMPHSABS 0.9 (L) 10/15/2013 1123   MONOABS 0.3 05/05/2023 1748   MONOABS 0.4 10/15/2013 1123   EOSABS 0.1 05/05/2023 1748   EOSABS 0.0 05/28/2022 1103   EOSABS 0.0 10/15/2013 1123   BASOSABS 0.0 05/05/2023 1748   BASOSABS 0.0 05/28/2022 1103   BASOSABS 0.0 10/15/2013 1123    BMET    Component Value Date/Time   NA 139 04/09/2024 0435   NA 140 05/28/2022 1103   NA 131 (L) 10/15/2013 1123   K 4.2 04/09/2024 0435   K 3.6 10/15/2013 1123   CL 109 04/09/2024 0435   CL 102 10/15/2013 1123   CO2  25 04/09/2024 0435   CO2 24 10/15/2013 1123   GLUCOSE 89 04/09/2024 0435   GLUCOSE 111 (H) 10/15/2013 1123   BUN 5 (L) 04/09/2024 0435   BUN 13 05/28/2022 1103   BUN 12 10/15/2013 1123   CREATININE 0.79 04/09/2024 0435   CREATININE 1.20 10/15/2013 1123   CALCIUM  8.8 (L) 04/09/2024 0435   CALCIUM  9.1 10/15/2013 1123   GFRNONAA >60 04/09/2024 0435   GFRNONAA 50 (L) 10/15/2013 1123   GFRAA >60 03/02/2020 1232   GFRAA 58 (L) 10/15/2013 1123    INR    Component Value Date/Time   INR 1.1 08/05/2021 1533     Intake/Output Summary (Last 24 hours) at 04/09/2024 0758 Last data filed at 04/08/2024 1900 Gross per 24 hour  Intake 1880 ml  Output --  Net 1880 ml     Assessment/Plan:  68 y.o. female is s/p SEE ABOVE 3 Days Post-Op   PLAN Patient endorses she is doing much better this morning.  She denies any abdominal pain.  She did not have any tenderness or distention on palpation this morning.  On auscultation she has bowel sounds.  She endorsed that she had gone to use the bathroom 4 times yesterday. I am okay with advancing  her diet today and letting her eat.  KUB was done yesterday which showed the possibility of obstruction versus ileus but this may be resolved due to her passing gas and using the bathroom.  Continue to monitor for signs and symptoms of ileus or obstruction.  If the patient can eat without pain nausea or vomiting or any diarrhea vascular surgery is okay with the patient being discharged to home.  She will need to be on aspirin  and Plavix  and continue her Lipitor  on a daily basis.  Patient to follow-up in clinic in 6 weeks with abdominal ultrasounds.  I will place follow-up in the system today   DVT prophylaxis: Aspirin  81 mg daily, Plavix  75 mg daily   Carmita Boom R Irma Delancey [facility] 04/09/2024 7:58 AM

## 2024-04-09 NOTE — Significant Event (Signed)
       CROSS COVER NOTE  NAME: [name] MRN: [id number] DOB : 12-07-1955 ATTENDING PHYSICIAN: Jerelene Critchley, MD    Date of Service   04/09/2024   HPI/Events of Note   Message received from nurse Rm [id number], Tylenol  allergy. please remove from [facility]   Interventions   Allergies reviewed in EPIC:anaphylaxis reaction type to acetaminophen  Acetaminophen  discontinued       Erminio LITTIE Cone NP [facility] Cross Cover 7pm-7am - check amion for availability Pager [phone]

## 2024-04-10 ENCOUNTER — Inpatient Hospital Stay

## 2024-04-10 ENCOUNTER — Other Ambulatory Visit: Payer: Self-pay

## 2024-04-10 LAB — BASIC METABOLIC PANEL WITH GFR
Anion gap: 8 (ref 5–15)
BUN: 10 mg/dL (ref 8–23)
CO2: 23 mmol/L (ref 22–32)
Calcium: 8.7 mg/dL — ABNORMAL LOW (ref 8.9–10.3)
Chloride: 107 mmol/L (ref 98–111)
Creatinine, Ser: 0.75 mg/dL (ref 0.44–1.00)
GFR, Estimated: >60 mL/min (ref >60)
Glucose, Bld: 112 mg/dL — ABNORMAL HIGH (ref 70–99)
Potassium: 3.6 mmol/L (ref 3.5–5.1)
Sodium: 138 mmol/L (ref 135–145)

## 2024-04-10 LAB — MAGNESIUM: Magnesium: 1.9 mg/dL (ref 1.7–2.4)

## 2024-04-10 MED ORDER — ALUM & MAG HYDROXIDE-SIMETH 200-200-20 MG/5ML PO SUSP
30.0000 mL | Freq: Four times a day (QID) | ORAL | 0 refills | Status: DC | PRN
  Filled 2024-04-10: qty 355, 3d supply, fill #0

## 2024-04-10 MED ORDER — ALUM & MAG HYDROXIDE-SIMETH 200-200-20 MG/5ML PO SUSP
30.0000 mL | Freq: Four times a day (QID) | ORAL | Status: DC | PRN
  Administered 2024-04-10: 30 mL via ORAL
  Filled 2024-04-10: qty 30

## 2024-04-10 MED ORDER — LACTATED RINGERS IV SOLN: INTRAVENOUS | Status: DC

## 2024-04-10 NOTE — Discharge Summary (Addendum)
 Physician Discharge Summary   Patient: [name] MRN: [id number] DOB: Jun 20, 1956  Admit date:     04/06/2024  Discharge date: 04/10/24  Discharge Physician: Laree Lock   PCP: Liana Fish, NP   Recommendations at discharge:   Follow up with PCP in 1-2 weeks - Repeat BMP, CBC, Mag Follow up with Vascular surgery in 6 weeks  Discharge Diagnoses: Principal Problem:   Stenosis of inferior mesenteric artery (HCC) Active Problems:   Coronary artery disease due to lipid rich plaque   PAD (peripheral artery disease) (HCC)   Tobacco use disorder   COPD (chronic obstructive pulmonary disease) (HCC)   Essential hypertension   Depression   Trichomoniasis   Malignant neoplasm of upper lobe of right lung (HCC)   Periumbilical abdominal pain   Mesenteric ischemia (HCC)   Stenosis of celiac artery (HCC)   Celiac artery stenosis (HCC)  Resolved Problems:   * No resolved hospital problems. *  Hospital Course:  [name] is a 68 y.o. female with medical history significant for Depression, hypertension, hyperlipidemia, COPD, CAD, PVD, right lung cancer s/p surgery, tobacco use disorder admitted for acute on chronic mesenteric ischemia. Imaging shows severe narrowing at the origin of the celiac axis and inferior mesenteric artery, Occlusion of the proximal superior mesenteric artery with extensive atherosclerotic calcification involving the mid and distal SMA. Vascular surgery was consulted and performed PCI for SMA occlusion and celiac artery stenosis 8/4. She was discontinued from heparin  gtt and continued on aspirin  and plavix    Patient had abdominal pain and emesis on 08/05, diet changed to clear liquid.  Patient continued to have intermittent abdominal pain, x-ray consistent with intermittent obstruction vs ileus, resolved with conservative management Patient had bloating this morning, Xray shows gaseous distention resolved, symptoms improved with Maalox, tolerating diet,  passing gas  Assessment and Plan: Acute on chronic mesenteric ischemia - PCI for SMA occlusion and celiac artery stenosis 8/4 - s/p Heparin  infusion, continue aspirin , plavix , lipitor , Zetia  - follow up outpatient at discharge in 6 weeks with abd US  with vascular surgery   Likely/intermittent obstruction versus ileus - resolved - XR nonspecific gaseous distention of small bowel, reflect early/intermittent obstruction vs ileus.  - resolved with conservative management, denies abdominal pain, nausea/vomiting.  Passing gas and having BM's - added Maalox for bloating   Severe Hypomagnesemia - resolved - s/p IV Magnesium   AKI - resolved - resolved s/p IV fluids   Coronary artery disease due to lipid rich plaque  PAD Continue atorvastatin , ezetimibe , losartan  and metoprolol    Tobacco use disorder Nicotine  patch   COPD (chronic obstructive pulmonary disease) (HCC) Not acutely exacerbated DuoNeb as needed   Essential hypertension Continue home losartan  and metoprolol    Malignant neoplasm of upper lobe of right lung (HCC) S/p right upper lobe posterior segmentectomy (March 20, 2023) Followed at [facility]    Trichomoniasis Treated with metronidazole  2000 mg x 1   Depression Continue home meds   --Patient able to independently walk around the room with no assistance     Consultants: Vascular surgery Procedures performed: PCI for SMA occlusion and celiac artery stenosis  Disposition: Home Diet recommendation:  Discharge Diet Orders (From admission, onward)     Start     Ordered   04/10/24 0000  Diet - low sodium heart healthy        04/10/24 1356            DISCHARGE MEDICATION: Allergies as of 04/10/2024       Reactions  Tylenol  [acetaminophen ] Anaphylaxis   Aspirin  Nausea And Vomiting, Other (See Comments)   Pt states aspirin  makes her cramp and have to use coated kind.   Zyrtec [cetirizine] Other (See Comments)   Upset stomach   Tramadol Rash         Medication List     STOP taking these medications    acetaminophen  500 MG tablet Commonly known as: TYLENOL    cephALEXin  500 MG capsule Commonly known as: KEFLEX        TAKE these medications    albuterol  108 (90 Base) MCG/ACT inhaler Commonly known as: VENTOLIN  HFA Inhale 2 puffs into the lungs every 4 (four) hours as needed for wheezing or shortness of breath.   alum & mag hydroxide-simeth 200-200-20 MG/5ML suspension Commonly known as: MAALOX/MYLANTA Take 30 mLs by mouth every 6 (six) hours as needed for indigestion or heartburn.   aspirin  EC 81 MG tablet Take 1 tablet (81 mg total) by mouth daily.   atorvastatin  80 MG tablet Commonly known as: LIPITOR  Take 1 tablet (80 mg total) by mouth daily.   budesonide -formoterol  80-4.5 MCG/ACT inhaler Commonly known as: Symbicort  INHALE 2 PUFFS ONCE DAILY   clopidogrel  75 MG tablet Commonly known as: PLAVIX  Take 1 tablet (75 mg total) by mouth daily.   cyclobenzaprine  5 MG tablet Commonly known as: FLEXERIL  Take 1 tablet (5 mg total) by mouth at bedtime as needed for muscle spasms.   ezetimibe  10 MG tablet Commonly known as: ZETIA  Take 1 tablet (10 mg total) by mouth daily.   ferrous sulfate  325 (65 FE) MG tablet Take 325 mg by mouth daily.   gabapentin  100 MG capsule Commonly known as: NEURONTIN  Take 1 capsule (100 mg total) by mouth 3 (three) times daily.   losartan  50 MG tablet Commonly known as: COZAAR  Take 2 tablets (100 mg total) by mouth daily.   metoprolol  succinate 50 MG 24 hr tablet Commonly known as: TOPROL -XL TAKE 1 TABLET BY MOUTH ONCE DAILY WITH  OR  IMMEDIATELY  FOLLOWING  A  MEAL   nicotine  14 mg/24hr patch Commonly known as: NICODERM CQ  - dosed in mg/24 hours Place 1 patch (14 mg total) onto the skin daily.   ondansetron  4 MG disintegrating tablet Commonly known as: ZOFRAN -ODT Take 1 tablet (4 mg total) by mouth every 8 (eight) hours as needed for nausea or vomiting.   thiamine  100 MG  tablet Commonly known as: Vitamin B-1 Take 1 tablet (100 mg total) by mouth daily.   VITAMIN D -3 PO Take 1 tablet by mouth daily.        Follow-up Information     Liana Fish, NP Follow up on 04/14/2024.   Specialty: Nurse Practitioner Why: APPT @ 2:30 PM WITH DR. LIANA Pass information: [id number] [place] [address] [phone]         Delores Orvin BRAVO, NP. Go on 05/19/2024.   Specialty: Vascular Surgery Why: NO ANSWER AT THE OFFICE. PT NEEDS TO MAKE FOLLOW UP Mesenteric Ultrasound Contact information: [address] [place] [address] [phone]                Discharge Exam: [name]   04/06/24 1439 04/06/24 2127  Weight: 50.8 kg 49.6 kg   General: awake, alert, NAD Respiratory: normal respiratory effort. Cardiovascular: quick capillary refill, normal S1/S2, RRR, no JVD, murmurs Gastrointestinal: soft, NT, ND, BS +  Nervous: A&O x3. no gross focal neurologic deficits, normal speech Extremities: moves all equally, no edema, normal tone  Skin: dry, intact, normal temperature, normal color. No rashes, lesions or ulcers on exposed skin  Condition at discharge: good  The results of significant diagnostics from this hospitalization (including imaging, microbiology, ancillary and laboratory) are listed below for reference.   Imaging Studies: DG Abd 1 View Result Date: 04/10/2024 CLINICAL DATA:  Abdominal pain.  Bowel obstruction. EXAM: ABDOMEN - 1 VIEW COMPARISON:  04/08/2024 FINDINGS: The bowel gas pattern is unremarkable. Decreased small bowel gas since prior study. Right upper quadrant surgical clips from prior cholecystectomy. Diffuse atherosclerotic calcification noted. IMPRESSION: Unremarkable bowel gas pattern. Decreased small bowel gas since prior study. Electronically Signed   By: Norleen DELENA Kil M.D.   On: 04/10/2024 11:32   DG Abd 1 View Result Date: 04/08/2024 CLINICAL DATA:  Bowel obstruction EXAM: ABDOMEN -  1 VIEW COMPARISON:  04/06/2024 FINDINGS: Two supine frontal views of the abdomen and pelvis demonstrate mild diffuse gaseous distension of the small bowel, not appreciably changed since prior study. Minimal gas and stool throughout the colon. Diffuse atherosclerosis. New stent in the distribution of the celiac artery. No abdominal masses or abnormal calcifications. No acute bony abnormalities. IMPRESSION: 1. Nonspecific gaseous distention of the small bowel, which could reflect early/intermittent obstruction versus ileus. 2. Diffuse atherosclerosis.  New celiac artery stent. Electronically Signed   By: Ozell Daring M.D.   On: 04/08/2024 17:16   PERIPHERAL VASCULAR CATHETERIZATION Result Date: 04/06/2024 See surgical note for result.  CT Angio Abd/Pel W and/or Wo Contrast Result Date: 04/06/2024 EXAM: CTA ABDOMEN AND PELVIS WITH AND WITHOUT CONTRAST 04/06/2024 01:53:13 AM TECHNIQUE: CTA images of the abdomen and pelvis with and without intravenous contrast. Three-dimensional MIP/volume rendered formations were performed. Automated exposure control, iterative reconstruction, and/or weight based adjustment of the mA/kV was utilized to reduce the radiation dose to as low as reasonably achievable. COMPARISON: None available. CLINICAL HISTORY: Mesenteric ischemia, acute; Mesenteric ischemia, chronic. Pt to ed from home via [facility] for abd pain. Just seen Friday here for same. All vitals WNL. Pt is caox4, in no acute distress and ambulatory on scene fro EMS. FINDINGS: VASCULATURE: Advanced mixed density atherosclerotic plaque in the aorta and its mesenteric, renal, and iliac artery branches. Moderate narrowing of the splenic artery. AORTA: No acute finding. No abdominal aortic aneurysm. No dissection. CELIAC TRUNK: Severe narrowing at the origin of the celiac axis. SUPERIOR MESENTERIC ARTERY: Occlusion of the proximal SMA. Extensive atherosclerotic calcification involving the mid and distal superior mesenteric artery.  INFERIOR MESENTERIC ARTERY: Severe narrowing at the origin. RENAL ARTERIES: Severe narrowing of the left renal artery. There is an accessory left renal artery. Moderate narrowing of the right renal artery near the origin. ILIAC ARTERIES: Moderate narrowing of the left external and internal iliac arteries. Severe narrowing of the proximal right internal iliac artery. Moderate narrowing of the right external iliac artery. Probable changes of endarterectomy in the left superficial  femoral artery. LIVER: Patent portal vein. No acute abnormality. GALLBLADDER AND BILE DUCTS: Cholecystectomy. SPLEEN: The spleen is unremarkable. PANCREAS: Pancreatic divisum. ADRENAL GLANDS: Bilateral adrenal glands demonstrate no acute abnormality. KIDNEYS, URETERS AND BLADDER: The bladder is not distended. GI AND BOWEL: No bowel obstruction. Mild distention of the stomach. No bowel wall thickening. Mucosal hyper-enhancement of the small bowel. This can be a normal finding seen with late arterial phase enhancement however given significant narrowing in all 3 mesenteric arteries, hyperemia secondary to reperfusion in the setting of chronic mesenteric ischemia should be considered. REPRODUCTIVE: Hysterectomy. PERITONEUM AND RETRPERITONEUM: No ascites or free air. LUNG  BASE: No acute abnormality. LYMPH NODES: No lymphadenopathy. BONES AND SOFT TISSUES: No acute abnormality of the bones. No acute soft tissue abnormality. IMPRESSION: 1. Severe narrowing at the origin of the celiac axis and inferior mesenteric artery. 2. 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 3. Mucosal hyper-enhancement of the small bowel, possibly related to reperfusion in the setting of chronic mesenteric ischemia. Electronically signed by: Norman Gatlin MD 04/06/2024 02:21 AM EDT RP Workstation: [id number]   CT L-SPINE NO CHARGE Result Date: 04/03/2024 CLINICAL DATA:  Back pain.  EXAM: CT LUMBAR SPINE WITHOUT CONTRAST TECHNIQUE: Multidetector CT imaging of the lumbar spine was performed without intravenous contrast administration. Multiplanar CT image reconstructions were also generated. RADIATION DOSE REDUCTION: This exam was performed according to the departmental dose-optimization program which includes automated exposure control, adjustment of the mA and/or kV according to patient size and/or use of iterative reconstruction technique. COMPARISON:  CT abdomen/pelvis dated 03/29/2024. Lumbar spine radiographs dated 01/08/2014. FINDINGS: Segmentation: 5 lumbar type vertebrae. Alignment: Mild levocurvature of the mid lumbar spine. Vertebrae: No acute fracture or focal pathologic process. Paraspinal and other soft tissues: Negative. Disc levels: Mild anterior endplate osteophytosis at L4-L5. Mild posterior disc bulging at L5-S1 and L4-L5. Moderate facet arthropathy at L4-L5 and L5-S1. Degenerative changes contribute to moderate bilateral foraminal narrowing at L4-L5 and moderate left foraminal narrowing at L5-S1. Degenerative arthropathy of the left greater than right sacroiliac joints. IMPRESSION: 1. No acute fracture or traumatic listhesis of the lumbar spine. 2. Moderate facet arthropathy at L4-L5 and L5-S1. Mild posterior disc bulging at L5-S1 and L4-L5. Degenerative changes contribute to moderate bilateral foraminal narrowing at L4-L5 and moderate left foraminal narrowing at L5-S1. 3. Degenerative arthropathy of the left greater than right sacroiliac joints. Electronically Signed   By: Harrietta Sherry M.D.   On: 04/03/2024 16:11   CT ABDOMEN PELVIS W CONTRAST Result Date: 04/03/2024 CLINICAL DATA:  Abdominal pain. EXAM: CT ABDOMEN AND PELVIS WITH CONTRAST TECHNIQUE: Multidetector CT imaging of the abdomen and pelvis was performed using the standard protocol following bolus administration of intravenous contrast. RADIATION DOSE REDUCTION: This exam was performed according to the  departmental dose-optimization program which includes automated exposure control, adjustment of the mA and/or kV according to patient size and/or use of iterative reconstruction technique. CONTRAST:  60mL OMNIPAQUE  IOHEXOL  300 MG/ML  SOLN COMPARISON:  March 29, 2024. FINDINGS: Lower chest: No acute abnormality. Hepatobiliary: No focal liver abnormality is seen. Status post cholecystectomy. No biliary dilatation. Pancreas: Unremarkable. No pancreatic ductal dilatation or surrounding inflammatory changes. Spleen: Normal in size without focal abnormality. Adrenals/Urinary Tract: Adrenal glands appear normal. Small right renal cyst is noted. No hydronephrosis or renal obstruction is noted. Urinary bladder is unremarkable. Stomach/Bowel: Stomach is within normal limits. No evidence of bowel obstruction or inflammation. The appendix is not clearly visualized. Vascular/Lymphatic: Aortic atherosclerosis. No enlarged abdominal or pelvic lymph nodes. Reproductive: Status post hysterectomy. No adnexal masses. Other: No ascites or hernia. Musculoskeletal: No acute or significant osseous findings. IMPRESSION: No acute abnormality seen in the abdomen or pelvis. Aortic Atherosclerosis (ICD10-I70.0). Electronically Signed   By: Lynwood Landy Raddle M.D.   On: 04/03/2024 15:37   CT ABDOMEN PELVIS W CONTRAST Result Date: 03/29/2024 CLINICAL DATA:  Acute nonlocalized abdominal pain vomiting, diarrhea EXAM: CT ABDOMEN AND PELVIS WITH CONTRAST TECHNIQUE: Multidetector CT imaging of the abdomen and pelvis was performed using the standard protocol following bolus administration of intravenous contrast. RADIATION DOSE REDUCTION: This  exam was performed according to the departmental dose-optimization program which includes automated exposure control, adjustment of the mA and/or kV according to patient size and/or use of iterative reconstruction technique. CONTRAST:  80mL OMNIPAQUE  IOHEXOL  300 MG/ML  SOLN COMPARISON:  01/22/2024, 05/05/2023  FINDINGS: Lower chest: No acute abnormality. Hepatobiliary: Mild hepatic steatosis. No enhancing intrahepatic mass. Status post cholecystectomy. Stable mild intrahepatic and marked extrahepatic biliary ductal dilation with the extrahepatic bile duct measuring up to 16 mm in diameter, likely representing post cholecystectomy change. Pancreas: Complete pancreatic divisum. Pancreas is otherwise unremarkable and there are no peripancreatic inflammatory changes identified. Spleen: Unremarkable Adrenals/Urinary Tract: Adrenal glands are unremarkable. Kidneys are normal, without renal calculi, focal lesion, or hydronephrosis. Small vascular calcifications noted within the renal hila bilaterally. Bladder is unremarkable. Stomach/Bowel: Stomach is within normal limits. Appendix appears normal. No evidence of bowel wall thickening, distention, or inflammatory changes. Vascular/Lymphatic: Extensive aortoiliac and visceral atherosclerotic calcification. High-grade (greater than 75%) stenosis of the celiac axis and inferior mesenteric artery at their origin and short segment occlusion of the superior mesenteric artery secondary to calcified atherosclerotic plaque at its origin. Extensive atherosclerotic calcification involving mid and distal superior mesenteric artery. Hemodynamically significant stenoses of the renal artery origins is suspected bilaterally, not optimally characterized on this non arteriographic examination. Extensive atherosclerotic calcification within the lower extremity arterial inflow with multifocal hemodynamically significant stenoses of the lower extremity arterial inflow and visualized right lower extremity arterial outflow. Probable changes of left common femoral endarterectomy. No pathologic adenopathy within the abdomen and pelvis. Reproductive: Status post hysterectomy. No adnexal masses. Other: Tiny right fat containing inguinal hernia. Musculoskeletal: No acute bone abnormality. No lytic or  blastic bone lesion. Osseous structures are age appropriate. IMPRESSION: 1. No acute intra-abdominal/intrapelvic pathology identified. 2. Extensive aortoiliac and visceral atherosclerotic calcification. Multiple visceral and lower extremity arterial inflow and outflow stenoses as outlined above. If there is clinical evidence of chronic mesenteric ischemia or hemodynamically significant renal artery stenosis, these findings can be further evaluated with CT angiography of the abdomen and pelvis. 3. Mild hepatic steatosis. 4. Status post cholecystectomy. Stable probable post cholecystectomy change. 5. Complete pancreatic divisum. No evidence of acute pancreatitis. Aortic Atherosclerosis (ICD10-I70.0). Electronically Signed   By: Dorethia Molt M.D.   On: 03/29/2024 00:58    Microbiology: Results for orders placed or performed during the hospital encounter of 04/06/24  Chlamydia/NGC rt PCR ([facility] only)     Status: None   Collection Time: 04/06/24  1:39 AM   Specimen: Vaginal  Result Value Ref Range Status   Specimen source GC/Chlam SWAB  Final   Chlamydia Tr NOT DETECTED NOT DETECTED Final   N gonorrhoeae NOT DETECTED NOT DETECTED Final    Comment: (NOTE) This CT/NG assay has not been evaluated in patients with a history of  hysterectomy. Performed at [facility], [address] [place], [place] [address]   Wet prep, genital     Status: Abnormal   Collection Time: 04/06/24  1:59 AM   Specimen: Vaginal  Result Value Ref Range Status   Yeast Wet Prep HPF POC NONE SEEN NONE SEEN Final   Trich, Wet Prep PRESENT (A) NONE SEEN Final   Clue Cells Wet Prep HPF POC NONE SEEN NONE SEEN Final   WBC, Wet Prep HPF POC >=10 (A) <10 Final   Sperm NONE SEEN  Final    Comment: Performed at [facility], [address] [place], [place] [address]    Labs: CBC: Recent Labs  Lab  04/05/24 2053 04/06/24 1201 04/06/24 2233 04/07/24 0602 04/08/24 0427 04/09/24 0435  WBC 6.0 4.7  --   6.4 4.6 4.6  HGB 11.2* 8.9* 9.2* 8.6* 8.7* 9.2*  HCT 34.0* 27.8*  --  27.0* 25.5* 27.4*  MCV 100.0 100.7*  --  102.7* 97.7 97.2  PLT 301 224  --  230 201 226   Basic Metabolic Panel: Recent Labs  Lab 04/05/24 2053 04/06/24 1201 04/08/24 0427 04/09/24 0435 04/09/24 1752 04/10/24 0435  NA 137 136 137 139  --  138  K 3.3* 3.5 3.7 4.2  --  3.6  CL 103 109 109 109  --  107  CO2 22 20* 21* 25  --  23  GLUCOSE 104* 82 88 89  --  112*  BUN 16 13 <5* 5*  --  10  CREATININE 1.20* 1.08* 0.86 0.79  --  0.75  CALCIUM  10.2 8.6* 8.6* 8.8*  --  8.7*  MG  --   --   --  1.0* 2.5* 1.9   Liver Function Tests: Recent Labs  Lab 04/05/24 2053  AST 23  ALT 16  ALKPHOS 61  BILITOT 0.4  PROT 7.8  ALBUMIN 4.2   CBG: No results for input(s): GLUCAP in the last 168 hours.  Discharge time spent: greater than 30 minutes.  Signed: Laree Lock, MD [facility] 04/10/2024

## 2024-04-13 ENCOUNTER — Encounter: Payer: Self-pay | Admitting: Nurse Practitioner

## 2024-04-13 ENCOUNTER — Telehealth: Payer: Self-pay

## 2024-04-13 ENCOUNTER — Ambulatory Visit (INDEPENDENT_AMBULATORY_CARE_PROVIDER_SITE_OTHER): Admitting: Nurse Practitioner

## 2024-04-13 VITALS — BP 132/74 | HR 70 | Temp 96.8°F | Resp 16 | Ht 64.0 in | Wt 102.8 lb

## 2024-04-13 DIAGNOSIS — I739 Peripheral vascular disease, unspecified: Secondary | ICD-10-CM

## 2024-04-13 DIAGNOSIS — K559 Vascular disorder of intestine, unspecified: Secondary | ICD-10-CM | POA: Diagnosis not present

## 2024-04-13 DIAGNOSIS — K551 Chronic vascular disorders of intestine: Secondary | ICD-10-CM

## 2024-04-13 DIAGNOSIS — Z09 Encounter for follow-up examination after completed treatment for conditions other than malignant neoplasm: Secondary | ICD-10-CM | POA: Diagnosis not present

## 2024-04-13 DIAGNOSIS — I774 Celiac artery compression syndrome: Secondary | ICD-10-CM | POA: Diagnosis not present

## 2024-04-13 MED ORDER — OXYCODONE HCL 5 MG PO TABS
5.0000 mg | ORAL_TABLET | Freq: Every day | ORAL | 0 refills | Status: DC | PRN
Start: 2024-04-13 — End: 2024-05-20

## 2024-04-13 NOTE — Transitions of Care (Post Inpatient/ED Visit) (Signed)
   04/13/2024  Name: [name] MRN: [id number] DOB: April 21, 1956  Today's TOC FU Call Status: Today's TOC FU Call Status:: Unsuccessful Call (1st Attempt) Unsuccessful Call (1st Attempt) Date: 04/13/24  Attempted to reach the patient regarding the most recent Inpatient/ED visit. PCP appointment at 11oo am today, 04/13/2024.   Follow Up Plan: Additional outreach attempts will be made to reach the patient to complete the Transitions of Care (Post Inpatient/ED visit) call.    Bing Edison MSN, RN RN Case Sales executive Health  [facility] Office Hours M-F [phone] Direct Dial: [phone] Main Phone [phone]  Fax: [phone] [email or web address]

## 2024-04-13 NOTE — Progress Notes (Signed)
 [facility] [name] [address] [place] [address] [phone]                                   Transitional Care Clinic   [facility] Discharge Acute Issues Care Follow Up                                                                        Patient Demographics  [name], is a 68 y.o. female  DOB 28-Oct-1955  MRN [id number].  Primary MD  Liana Fish, NP  Admit date:     04/06/2024  Discharge date: 04/10/24    Reason for TCC follow Up - Stenosis of inferior mesenteric artery    Past Medical History:  Diagnosis Date   Adenocarcinoma of upper lobe of right lung (HCC)    a.) s/p posterior segmentectomy on 03/20/23 at [facility]   Anemia    Arthritis    Asthma    Carotid artery stenosis    a.) doppler 05/14/2017: 1-39% BICA; b.) doppler 04/05/2018: 50-69% BICA; c.) doppler 06/12/2019: 40-59% BICA; d.) doppler 06/14/2020, 06/14/2021: 1-39% BICA; e.) doppler 06/19/2022: 1.39% RICA, 40-59% LICA   Colon polyp    COPD (chronic obstructive pulmonary disease) (HCC)    Coronary artery disease 04/03/2006   a.) LHC 04/03/2006: 25% mLM, 25% mLAD, 50% D1, 25/50% mLCx, 50% dLCx, 25% OM2, 100% pRCA --> med mgmt; b.) LHC 04/25/2010: 25% pLM, 25% pLAD, 50% pLCx, 70% dLCx, 100% pRCA --> med mgmt   Depression    Diastolic dysfunction    a.) TTE 04/05/2018: EF 65%, no RWMAs, G1DD, triv MR/TR; b.) TTE 10/15/2022: EF 88%, mod LVH, G1DD, triv TR, mild MR; c.) TTE 02/27/2023: EF 65%, no RWMAs, triv TR/MR, RVSP 35; d.) TTE 03/21/2023: EF 65%, no RWMAs, norm RVSF, PASP 44   Environmental allergies    Headache    Hyperlipidemia    Hypertension    Hypoxic respiratory failure (HCC)    Internal hemorrhoids    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[facility])    a.) thinks it was in 2007 --> LHC 04/03/2006 revealed a 100% pRCA --> medically managed   Necrotizing pneumonia (HCC)    Pulmonary HTN (HCC)    a.) TTE 02/27/2023: RVSP 35 mmHg; b.) TTE 03/21/2023: PASP 44 mmHg   PVD  (peripheral vascular disease) with claudication (HCC)    Shortness of breath dyspnea    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    Substance abuse (HCC)    Substance induced mood disorder (HCC)    Suicidal ideation    Thrombocytopenia (HCC)    Tobacco abuse    Unstable angina (HCC)    Ventral hernia    Vitamin D  deficiency     Past Surgical History:  Procedure Laterality Date   ABDOMINAL HYSTERECTOMY     APPENDECTOMY     CARDIAC CATHETERIZATION     CARDIAC SURGERY     CHOLECYSTECTOMY     COLONOSCOPY  12/15/2011   OH->bleeding internal hemorrhoids, otherwise normal   COLONOSCOPY WITH PROPOFOL  N/A 04/12/2015   Procedure: COLONOSCOPY WITH PROPOFOL ;  Surgeon: Rogelia Copping, MD;  Location: [facility] ENDOSCOPY;  Service: Endoscopy;  Laterality: N/A;   COLONOSCOPY WITH PROPOFOL  N/A 01/05/2020   Procedure: COLONOSCOPY WITH PROPOFOL ;  Surgeon: Jinny Carmine, MD;  Location: [facility] ENDOSCOPY;  Service: Endoscopy;  Laterality: N/A;   ENDARTERECTOMY FEMORAL Left 04/26/2015   Procedure: ENDARTERECTOMY FEMORAL;  Surgeon: Selinda GORMAN Gu, MD;  Location: [facility] ORS;  Service: Vascular;  Laterality: Left;   ENDOVASCULAR STENT INSERTION Bilateral    Legs   FLEXIBLE BRONCHOSCOPY N/A 02/02/2015   Procedure: FLEXIBLE BRONCHOSCOPY;  Surgeon: Elfreda DELENA Bathe, MD;  Location: [facility] ORS;  Service: Pulmonary;  Laterality: N/A;   HEMORRHOID SURGERY     lung mass removed     PERIPHERAL VASCULAR CATHETERIZATION Left 04/25/2015   Procedure: Lower Extremity Angiography;  Surgeon: Selinda GORMAN Gu, MD;  Location: [facility] INVASIVE CV LAB;  Service: Cardiovascular;  Laterality: Left;   PERIPHERAL VASCULAR CATHETERIZATION Left 04/25/2015   Procedure: Lower Extremity Intervention;  Surgeon: Selinda GORMAN Gu, MD;  Location: [facility] INVASIVE CV LAB;  Service: Cardiovascular;  Laterality: Left;   PERIPHERAL VASCULAR CATHETERIZATION Left 01/02/2016   Procedure: Lower Extremity Angiography;  Surgeon: Selinda GORMAN Gu, MD;  Location: [facility] INVASIVE  CV LAB;  Service: Cardiovascular;  Laterality: Left;   PERIPHERAL VASCULAR CATHETERIZATION  01/02/2016   Procedure: Lower Extremity Intervention;  Surgeon: Selinda GORMAN Gu, MD;  Location: [facility] INVASIVE CV LAB;  Service: Cardiovascular;;   THROMBECTOMY FEMORAL ARTERY Left 04/26/2015   Procedure: THROMBECTOMY FEMORAL ARTERY;  Surgeon: Selinda GORMAN Gu, MD;  Location: [facility] ORS;  Service: Vascular;  Laterality: Left;   VENTRAL HERNIA REPAIR N/A 07/25/2023   Procedure: HERNIA REPAIR VENTRAL ADULT, open;  Surgeon: Desiderio Schanz, MD;  Location: [facility] ORS;  Service: General;  Laterality: N/A;   VISCERAL ANGIOGRAPHY N/A 04/06/2024   Procedure: VISCERAL ANGIOGRAPHY;  Surgeon: Gu Selinda GORMAN, MD;  Location: [facility] INVASIVE CV LAB;  Service: Cardiovascular;  Laterality: N/A;       Recent HPI and Hospital Course    [name] is a 68 y.o. female with medical history significant for Depression, hypertension, hyperlipidemia, COPD, CAD, PVD, right lung cancer s/p surgery, tobacco use disorder admitted for acute on chronic mesenteric ischemia. Imaging shows severe narrowing at the origin of the celiac axis and inferior mesenteric artery, Occlusion of the proximal superior mesenteric artery with extensive atherosclerotic calcification involving the mid and distal SMA. Vascular surgery was consulted and performed PCI for SMA occlusion and celiac artery stenosis 8/4. She was discontinued from heparin  gtt and continued on aspirin  and plavix    Patient had abdominal pain and emesis on 08/05, diet changed to clear liquid.  Patient continued to have intermittent abdominal pain, x-ray consistent with intermittent obstruction vs ileus, resolved with conservative management Patient had bloating this morning, Xray shows gaseous distention resolved, symptoms improved with Maalox, tolerating diet, passing gas   Assessment and Plan: Acute on chronic mesenteric ischemia - PCI for SMA occlusion and celiac artery stenosis 8/4 - s/p  Heparin  infusion, continue aspirin , plavix , lipitor , Zetia  - follow up outpatient at discharge in 6 weeks with abd US  with vascular surgery   Likely/intermittent obstruction versus ileus - resolved - XR nonspecific gaseous distention of small bowel, reflect early/intermittent obstruction vs ileus.  - resolved with conservative management, denies abdominal pain, nausea/vomiting.  Passing gas and having BM's - added Maalox for bloating   Severe Hypomagnesemia - resolved - s/p IV Magnesium    AKI - resolved - resolved s/p IV fluids   Coronary artery disease due to lipid rich plaque  PAD Continue atorvastatin , ezetimibe , losartan  and metoprolol   Tobacco use disorder Nicotine  patch   COPD (chronic obstructive pulmonary disease) (HCC) Not acutely exacerbated DuoNeb as needed   Essential hypertension Continue home losartan  and metoprolol    Malignant neoplasm of upper lobe of right lung (HCC) S/p right upper lobe posterior segmentectomy (March 20, 2023) Follow[facility] at [facility]    Trichomoniasis Treated with metronidazole  2000 mg x 1   Depression Continue home meds   --Patient able to independently walk around the room with no assistance    Post Hospital Acute Care Issue to be followed in the Clinic   Stenosis of inferior mesenteric artery (HCC) Active Problems:   Coronary artery disease due to lipid rich plaque   PAD (peripheral artery disease) (HCC)   Tobacco use disorder   COPD (chronic obstructive pulmonary disease) (HCC)   Essential hypertension   Depression   Trichomoniasis   Malignant neoplasm of upper lobe of right lung (HCC)   Periumbilical abdominal pain   Mesenteric ischemia (HCC)   Stenosis of celiac artery (HCC)   Celiac artery stenosis (HCC)   Subjective:   Floriene Lack today has, No headache, No chest pain, No abdominal pain - No Nausea, No new weakness tingling or numbness, No Cough - SOB.   Assessment & Plan   1. Stenosis of inferior mesenteric  artery (HCC) (Primary) Continue prn oxycodone  as prescribed. Follow up with vascular surgery.  - oxyCODONE  (ROXICODONE ) 5 MG immediate release tablet; Take 1 tablet (5 mg total) by mouth daily as needed for severe pain (pain score 7-10).  Dispense: 30 tablet; Refill: 0  2. Stenosis of celiac artery (HCC) Follow up with vascular surgery   3. PAD (peripheral artery disease) [facility]) Sees cardiology and also has seen vascular surgery, no upcoming appts with vascular surgery right now.   4. Mesenteric ischemia (HCC) Improved with stent placement while in the hospital   5. Hospital discharge follow-up A stent was placed while she was in the hospital and she is doing well at home now.     Reason for frequent admissions/ER visits    COPD HD    Objective:   Vitals:   04/13/24 1104  BP: 132/74  Pulse: 70  Resp: 16  Temp: (!) 96.8 F (36 C)  SpO2: 96%  Weight: 102 lb 12.8 oz (46.6 kg)  Height: 5' 4 (1.626 m)    Wt Readings from Last 3 Encounters:  04/13/24 102 lb 12.8 oz (46.6 kg)  04/06/24 109 lb 5.6 oz (49.6 kg)  04/03/24 111 lb 15.9 oz (50.8 kg)    Allergies as of 04/13/2024       Reactions   Tylenol  [acetaminophen ] Anaphylaxis   Aspirin  Nausea And Vomiting, Other (See Comments)   Pt states aspirin  makes her cramp and have to use coated kind.   Zyrtec [cetirizine] Other (See Comments)   Upset stomach   Tramadol Rash        Medication List        Accurate as of April 13, 2024 11:41 AM. If you have any questions, ask your nurse or doctor.          STOP taking these medications    nicotine  14 mg/24hr patch Commonly known as: NICODERM CQ  - dosed in mg/24 hours Stopped by: Mardy Maxin       TAKE these medications    albuterol  108 (90 Base) MCG/ACT inhaler Commonly known as: VENTOLIN  HFA Inhale 2 puffs into the lungs every 4 (four) hours as needed for wheezing or shortness of breath.  aspirin  EC 81 MG tablet Take 1 tablet (81 mg total) by  mouth daily.   atorvastatin  80 MG tablet Commonly known as: LIPITOR  Take 1 tablet (80 mg total) by mouth daily.   budesonide -formoterol  80-4.5 MCG/ACT inhaler Commonly known as: Symbicort  INHALE 2 PUFFS ONCE DAILY   clopidogrel  75 MG tablet Commonly known as: PLAVIX  Take 1 tablet (75 mg total) by mouth daily.   cyclobenzaprine  5 MG tablet Commonly known as: FLEXERIL  Take 1 tablet (5 mg total) by mouth at bedtime as needed for muscle spasms.   ezetimibe  10 MG tablet Commonly known as: ZETIA  Take 1 tablet (10 mg total) by mouth daily.   ferrous sulfate  325 (65 FE) MG tablet Take 325 mg by mouth daily.   gabapentin  100 MG capsule Commonly known as: NEURONTIN  Take 1 capsule (100 mg total) by mouth 3 (three) times daily.   Geri-Lanta 200-200-20 MG/5ML suspension Generic drug: alum & mag hydroxide-simeth Take 30 mLs by mouth every 6 (six) hours as needed for indigestion or heartburn.   losartan  50 MG tablet Commonly known as: COZAAR  Take 2 tablets (100 mg total) by mouth daily.   metoprolol  succinate 50 MG 24 hr tablet Commonly known as: TOPROL -XL TAKE 1 TABLET BY MOUTH ONCE DAILY WITH  OR  IMMEDIATELY  FOLLOWING  A  MEAL   ondansetron  4 MG disintegrating tablet Commonly known as: ZOFRAN -ODT Take 1 tablet (4 mg total) by mouth every 8 (eight) hours as needed for nausea or vomiting.   oxyCODONE  5 MG immediate release tablet Commonly known as: Roxicodone  Take 1 tablet (5 mg total) by mouth daily as needed for severe pain (pain score 7-10). Started by: Parrish Daddario   thiamine  100 MG tablet Commonly known as: Vitamin B-1 Take 1 tablet (100 mg total) by mouth daily.   VITAMIN D -3 PO Take 1 tablet by mouth daily.         Physical Exam: Constitutional: Patient appears well-developed and well-nourished. Not in obvious distress. HENT: Normocephalic, atraumatic, External right and left ear normal. Oropharynx is clear and moist.  Eyes: Conjunctivae and EOM are  normal. PERRLA, no scleral icterus. Neck: Normal ROM. Neck supple. No JVD. No tracheal deviation. No thyromegaly. CVS: RRR, S1/S2 +, no murmurs, no gallops, no carotid bruit.  Pulmonary: Effort and breath sounds normal, no stridor, rhonchi, wheezes, rales.  Abdominal: Soft. BS +, no distension, tenderness, rebound or guarding.  Musculoskeletal: Normal range of motion. No edema and no tenderness.  Lymphadenopathy: No lymphadenopathy noted, cervical, inguinal or axillary Neuro: Alert. Normal reflexes, muscle tone coordination. No cranial nerve deficit. Skin: Skin is warm and dry. No rash noted. Not diaphoretic. No erythema. No pallor. Psychiatric: Normal mood and affect. Behavior, judgment, thought content normal.   Data Review   Micro Results Recent Results (from the past 240 hours)  Urine Culture     Status: None   Collection Time: 04/03/24  5:39 PM   Specimen: Urine, Clean Catch  Result Value Ref Range Status   Specimen Description   Final    URINE, CLEAN CATCH Performed at [facility], [address] [place], [place] [address]    Special Requests   Final    NONE Performed at [facility], [address] [place], [place] [address]    Culture   Final    NO GROWTH Performed at [facility], [address], [place], [place] [address]    Report Status 04/04/2024 FINAL  Final  Chlamydia/NGC rt PCR ([facility] only)  Status: None   Collection Time: 04/06/24  1:39 AM   Specimen: Vaginal  Result Value Ref Range Status   Specimen source GC/Chlam SWAB  Final   Chlamydia Tr NOT DETECTED NOT DETECTED Final   N gonorrhoeae NOT DETECTED NOT DETECTED Final    Comment: (NOTE) This CT/NG assay has not been evaluated in patients with a history of  hysterectomy. Performed at [facility], [address] [place], [place] [address]   Wet prep, genital     Status: Abnormal   Collection Time: 04/06/24  1:59 AM   Specimen: Vaginal  Result Value Ref Range  Status   Yeast Wet Prep HPF POC NONE SEEN NONE SEEN Final   Trich, Wet Prep PRESENT (A) NONE SEEN Final   Clue Cells Wet Prep HPF POC NONE SEEN NONE SEEN Final   WBC, Wet Prep HPF POC >=10 (A) <10 Final   Sperm NONE SEEN  Final    Comment: Performed at [facility], [address] [place], [place] [address]     CBC Recent Labs  Lab 04/06/24 1201 04/06/24 2233 04/07/24 0602 04/08/24 0427 04/09/24 0435  WBC 4.7  --  6.4 4.6 4.6  HGB 8.9* 9.2* 8.6* 8.7* 9.2*  HCT 27.8*  --  27.0* 25.5* 27.4*  PLT 224  --  230 201 226  MCV 100.7*  --  102.7* 97.7 97.2  MCH 32.2  --  32.7 33.3 32.6  MCHC 32.0  --  31.9 34.1 33.6  RDW 13.4  --  13.4 13.2 13.2    Chemistries  Recent Labs  Lab 04/06/24 1201 04/08/24 0427 04/09/24 0435 04/09/24 1752 04/10/24 0435  NA 136 137 139  --  138  K 3.5 3.7 4.2  --  3.6  CL 109 109 109  --  107  CO2 20* 21* 25  --  23  GLUCOSE 82 88 89  --  112*  BUN 13 <5* 5*  --  10  CREATININE 1.08* 0.86 0.79  --  0.75  CALCIUM  8.6* 8.6* 8.8*  --  8.7*  MG  --   --  1.0* 2.5* 1.9   ------------------------------------------------------------------------------------------------------------------ estimated creatinine clearance is 49.5 mL/min (by C-G formula based on SCr of 0.75 mg/dL). ------------------------------------------------------------------------------------------------------------------ No results for input(s): HGBA1C in the last 72 hours. ------------------------------------------------------------------------------------------------------------------ No results for input(s): CHOL, HDL, LDLCALC, TRIG, CHOLHDL, LDLDIRECT in the last 72 hours. ------------------------------------------------------------------------------------------------------------------ No results for input(s): TSH, T4TOTAL, T3FREE, THYROIDAB in the last 72 hours.  Invalid input(s):  FREET3 ------------------------------------------------------------------------------------------------------------------ No results for input(s): VITAMINB12, FOLATE, FERRITIN, TIBC, IRON, RETICCTPCT in the last 72 hours.  Coagulation profile No results for input(s): INR, PROTIME in the last 168 hours.  No results for input(s): DDIMER in the last 72 hours.  Cardiac Enzymes No results for input(s): CKMB, TROPONINI, MYOGLOBIN in the last 168 hours.  Invalid input(s): CK ------------------------------------------------------------------------------------------------------------------ Invalid input(s): POCBNP  Return for previously scheduled, AWV, Rashaun Curl PCP in october. .   Time Spent in minutes  45 Time spent with patient included reviewing progress notes, labs, imaging studies, and discussing plan for follow up.   This patient was seen by Mardy Maxin, FNP-C in collaboration with Dr. Sigrid Bathe as a part of collaborative care agreement.    Mardy Maxin MSN, FNP-C on 04/13/2024 at 11:41 AM   **Disclaimer: This note may have been dictated with voice recognition software. Similar sounding words can inadvertently be transcribed and this note may contain transcription errors which may not have been corrected  upon publication of note.**

## 2024-04-13 NOTE — Transitions of Care (Post Inpatient/ED Visit) (Signed)
 Today's TOC FU Call Status: Today's TOC FU Call Status:: Successful TOC FU Call Completed TOC FU Call Complete Date: 04/13/24 Patient's Name and Date of Birth confirmed.  Transition Care Management Follow-up Telephone Call Date of Discharge: 04/10/24 Discharge Facility: [facility]) Type of Discharge: Inpatient Admission Primary Inpatient Discharge Diagnosis:: Stenosis of inferior mesenteric artery How have you been since you were released from the hospital?: Better Any questions or concerns?: No  Items Reviewed: Did you receive and understand the discharge instructions provided?: Yes Medications obtained,verified, and reconciled?: Yes (Medications Reviewed) Any new allergies since your discharge?: No Dietary orders reviewed?: Yes Type of Diet Ordered:: Diet - low sodium heart healthy Do you have support at home?: Yes Name of Support/Comfort Primary Source: K[place]ly, Taves (Daughter)  [phone] (Mobile)  Medications Reviewed Today: Medications Reviewed Today     Reviewed by Carolee Heron NOVAK, RN (Case Manager) on 04/13/24 at 1434  Med List Status: <None>   Medication Order Taking? Sig Documenting Provider Last Dose Status Informant  albuterol  (VENTOLIN  HFA) 108 (90 Base) MCG/ACT inhaler [id number] Yes Inhale 2 puffs into the lungs every 4 (four) hours as needed for wheezing or shortness of breath. McDonough, Tinnie POUR, PA-C  Active Self, Pharmacy Records  alum & mag hydroxide-simeth (MAALOX/MYLANTA) 200-200-20 MG/5ML suspension [id number] Yes Take 30 mLs by mouth every 6 (six) hours as needed for indigestion or heartburn. Jerelene Critchley, MD  Active   aspirin  EC 81 MG EC tablet [id number] Yes Take 1 tablet (81 mg total) by mouth daily. Marea Selinda RAMAN, MD  Active Self, Pharmacy Records  atorvastatin  (LIPITOR ) 80 MG tablet [id number] Yes Take 1 tablet (80 mg total) by mouth daily. Liana Fish, NP  Active Self, Pharmacy Records  budesonide -formoterol   (SYMBICORT ) 80-4.5 MCG/ACT inhaler [id number] Yes INHALE 2 PUFFS ONCE DAILY McDonough, Lauren K, PA-C  Active Self, Pharmacy Records  Cholecalciferol  (VITAMIN D -3 PO) [id number] Yes Take 1 tablet by mouth daily. [provider]  Active Self, Pharmacy Records  clopidogrel  (PLAVIX ) 75 MG tablet [id number] Yes Take 1 tablet (75 mg total) by mouth daily. Lenon Marien CROME, MD  Active   cyclobenzaprine  (FLEXERIL ) 5 MG tablet [id number] Yes Take 1 tablet (5 mg total) by mouth at bedtime as needed for muscle spasms. Liana Fish, NP  Active Self, Pharmacy Records  ezetimibe  (ZETIA ) 10 MG tablet [id number] Yes Take 1 tablet (10 mg total) by mouth daily. Liana Fish, NP  Active Self, Pharmacy Records  ferrous sulfate  325 (65 FE) MG tablet [id number] Yes Take 325 mg by mouth daily. [provider]  Active Self, Pharmacy Records  gabapentin  (NEURONTIN ) 100 MG capsule [id number] Yes Take 1 capsule (100 mg total) by mouth 3 (three) times daily. Victoria Ruts, MD  Active Self, Pharmacy Records           Med Note CATHY OVAL VEAR Pablo Apr 06, 2024 11:47 AM)    losartan  (COZAAR ) 50 MG tablet [id number] Yes Take 2 tablets (100 mg total) by mouth daily. Liana Fish, NP  Active Self, Pharmacy Records  metoprolol  succinate (TOPROL -XL) 50 MG 24 hr tablet [id number] Yes TAKE 1 TABLET BY MOUTH ONCE DAILY WITH  OR  IMMEDIATELY  FOLLOWING  A  MEAL Fernand Denyse LABOR, MD  Active Self, Pharmacy Records  ondansetron  (ZOFRAN -ODT) 4 MG disintegrating tablet [id number] Yes Take 1 tablet (4 mg total) by mouth every 8 (eight) hours as needed for nausea or vomiting. Willo Dunnings, MD  Active Self, Pharmacy Records  oxyCODONE  (ROXICODONE ) 5  MG immediate release tablet [id number] Yes Take 1 tablet (5 mg total) by mouth daily as needed for severe pain (pain score 7-10). Liana Fish, NP  Active   thiamine  (VITAMIN B-1) 100 MG tablet [id number] Yes Take 1 tablet (100 mg total) by mouth daily. Victoria Ruts, MD  Active Self, Pharmacy Records            Home Care and Equipment/Supplies: Were Home Health Services Ordered?: No Any new equipment or medical supplies ordered?: No  Functional Questionnaire: Do you need assistance with bathing/showering or dressing?: No Do you need assistance with meal preparation?: No Do you need assistance with eating?: No Do you have difficulty maintaining continence: No Do you need assistance with getting out of bed/getting out of a chair/moving?: No Do you have difficulty managing or taking your medications?: No  Follow up appointments reviewed: PCP Follow-up appointment confirmed?: Yes Date of PCP follow-up appointment?: 04/13/24 [facility] Follow-up appointment confirmed?: Yes Date of Specialist follow-up appointment?: 04/17/24 Follow-Up Specialty Provider:: Pulmonary Do you need transportation to your follow-up appointment?: No Do you understand care options if your condition(s) worsen?: Yes-patient verbalized understanding  SDOH Interventions Today    Flowsheet Row Most Recent Value  SDOH Interventions   Food Insecurity Interventions Intervention Not Indicated  Housing Interventions Intervention Not Indicated  Transportation Interventions Intervention Not Indicated, Patient Resources (Friends/Family)  Utilities Interventions Intervention Not Indicated   04/13/24: TOC RN CM successfully completed post discharge outreach phone call with patient.  Patient had completed PCP HFU today at 1100 am.  Patient has pulmonary follow up per patient  on 04/17/24.  Patient takes blood pressure at home occasionally but not routinely--this was encouraged. Blood pressure in PCP OV this am was recorded as 132/74. Patient was offered follow up calls but declined need at this time.  Understands how to contact insurance for benefits.     Bing Edison MSN, RN RN Case Sales executive Health  [facility]  Health Office Hours M-F [phone] Direct Dial: [phone] Main Phone [phone]  Fax: [phone] [email or web address]

## 2024-04-14 ENCOUNTER — Inpatient Hospital Stay: Admitting: Nurse Practitioner

## 2024-04-15 ENCOUNTER — Encounter: Payer: Self-pay | Admitting: Nurse Practitioner

## 2024-04-17 ENCOUNTER — Ambulatory Visit (INDEPENDENT_AMBULATORY_CARE_PROVIDER_SITE_OTHER): Admitting: Cardiovascular Disease

## 2024-04-17 ENCOUNTER — Encounter: Payer: Self-pay | Admitting: Cardiovascular Disease

## 2024-04-17 VITALS — BP 120/70 | HR 65 | Ht 64.0 in | Wt 98.6 lb

## 2024-04-17 DIAGNOSIS — I2510 Atherosclerotic heart disease of native coronary artery without angina pectoris: Secondary | ICD-10-CM

## 2024-04-17 DIAGNOSIS — R000 Tachycardia, unspecified: Secondary | ICD-10-CM

## 2024-04-17 DIAGNOSIS — I10 Essential (primary) hypertension: Secondary | ICD-10-CM

## 2024-04-17 DIAGNOSIS — I2583 Coronary atherosclerosis due to lipid rich plaque: Secondary | ICD-10-CM | POA: Diagnosis not present

## 2024-04-17 DIAGNOSIS — N179 Acute kidney failure, unspecified: Secondary | ICD-10-CM

## 2024-04-17 DIAGNOSIS — I6523 Occlusion and stenosis of bilateral carotid arteries: Secondary | ICD-10-CM

## 2024-04-17 DIAGNOSIS — K559 Vascular disorder of intestine, unspecified: Secondary | ICD-10-CM | POA: Diagnosis not present

## 2024-04-17 MED ORDER — METOPROLOL SUCCINATE ER 50 MG PO TB24: 50.0000 mg | ORAL_TABLET | Freq: Every day | ORAL | 0 refills | Status: DC

## 2024-04-17 NOTE — Progress Notes (Signed)
 Cardiology Office Note   Date:  04/17/2024   ID:  [name], DOB Jan 21, 1956, MRN [id number]  PCP:  Liana Fish, NP  Cardiologist:  Denyse Bathe, MD      History of Present Illness: [name] is a 68 y.o. female who presents for  Chief Complaint  Patient presents with   Follow-up    3 month    Doing good, admitted in [facility] as had UTI.      Past Medical History:  Diagnosis Date   Adenocarcinoma of upper lobe of right lung (HCC)    a.) s/p posterior segmentectomy on 03/20/23 at [facility]   Anemia    Arthritis    Asthma    Carotid artery stenosis    a.) doppler 05/14/2017: 1-39% BICA; b.) doppler 04/05/2018: 50-69% BICA; c.) doppler 06/12/2019: 40-59% BICA; d.) doppler 06/14/2020, 06/14/2021: 1-39% BICA; e.) doppler 06/19/2022: 1.39% RICA, 40-59% LICA   Colon polyp    COPD (chronic obstructive pulmonary disease) (HCC)    Coronary artery disease 04/03/2006   a.) LHC 04/03/2006: 25% mLM, 25% mLAD, 50% D1, 25/50% mLCx, 50% dLCx, 25% OM2, 100% pRCA --> med mgmt; b.) LHC 04/25/2010: 25% pLM, 25% pLAD, 50% pLCx, 70% dLCx, 100% pRCA --> med mgmt   Depression    Diastolic dysfunction    a.) TTE 04/05/2018: EF 65%, no RWMAs, G1DD, triv MR/TR; b.) TTE 10/15/2022: EF 88%, mod LVH, G1DD, triv TR, mild MR; c.) TTE 02/27/2023: EF 65%, no RWMAs, triv TR/MR, RVSP 35; d.) TTE 03/21/2023: EF 65%, no RWMAs, norm RVSF, PASP 44   Environmental allergies    Headache    Hyperlipidemia    Hypertension    Hypoxic respiratory failure (HCC)    Internal hemorrhoids    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[facility])    a.) thinks it was in 2007 --> LHC 04/03/2006 revealed a 100% pRCA --> medically managed   Necrotizing pneumonia (HCC)    Pulmonary HTN (HCC)    a.) TTE 02/27/2023: RVSP 35 mmHg; b.) TTE 03/21/2023: PASP 44 mmHg   PVD (peripheral vascular disease) with claudication (HCC)    Shortness of breath dyspnea    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    Substance abuse (HCC)     Substance induced mood disorder (HCC)    Suicidal ideation    Thrombocytopenia (HCC)    Tobacco abuse    Unstable angina (HCC)    Ventral hernia    Vitamin D  deficiency      Past Surgical History:  Procedure Laterality Date   ABDOMINAL HYSTERECTOMY     APPENDECTOMY     CARDIAC CATHETERIZATION     CARDIAC SURGERY     CHOLECYSTECTOMY     COLONOSCOPY  12/15/2011   OH->bleeding internal hemorrhoids, otherwise normal   COLONOSCOPY WITH PROPOFOL  N/A 04/12/2015   Procedure: COLONOSCOPY WITH PROPOFOL ;  Surgeon: Rogelia Copping, MD;  Location: [facility] ENDOSCOPY;  Service: Endoscopy;  Laterality: N/A;   COLONOSCOPY WITH PROPOFOL  N/A 01/05/2020   Procedure: COLONOSCOPY WITH PROPOFOL ;  Surgeon: Copping Rogelia, MD;  Location: [facility] ENDOSCOPY;  Service: Endoscopy;  Laterality: N/A;   ENDARTERECTOMY FEMORAL Left 04/26/2015   Procedure: ENDARTERECTOMY FEMORAL;  Surgeon: Selinda GORMAN Gu, MD;  Location: [facility] ORS;  Service: Vascular;  Laterality: Left;   ENDOVASCULAR STENT INSERTION Bilateral    Legs   FLEXIBLE BRONCHOSCOPY N/A 02/02/2015   Procedure: FLEXIBLE BRONCHOSCOPY;  Surgeon: Elfreda DELENA Bathe, MD;  Location: [facility] ORS;  Service: Pulmonary;  Laterality: N/A;   HEMORRHOID SURGERY  lung mass removed     PERIPHERAL VASCULAR CATHETERIZATION Left 04/25/2015   Procedure: Lower Extremity Angiography;  Surgeon: Selinda GORMAN Gu, MD;  Location: [facility];  Service: Cardiovascular;  Laterality: Left;   PERIPHERAL VASCULAR CATHETERIZATION Left 04/25/2015   Procedure: Lower Extremity Intervention;  Surgeon: Selinda GORMAN Gu, MD;  Location: [facility];  Service: Cardiovascular;  Laterality: Left;   PERIPHERAL VASCULAR CATHETERIZATION Left 01/02/2016   Procedure: Lower Extremity Angiography;  Surgeon: Selinda GORMAN Gu, MD;  Location: [facility];  Service: Cardiovascular;  Laterality: Left;   PERIPHERAL VASCULAR CATHETERIZATION  01/02/2016   Procedure: Lower Extremity Intervention;  Surgeon: Selinda GORMAN Gu,  MD;  Location: [facility];  Service: Cardiovascular;;   THROMBECTOMY FEMORAL ARTERY Left 04/26/2015   Procedure: THROMBECTOMY FEMORAL ARTERY;  Surgeon: Selinda GORMAN Gu, MD;  Location: [facility] ORS;  Service: Vascular;  Laterality: Left;   VENTRAL HERNIA REPAIR N/A 07/25/2023   Procedure: HERNIA REPAIR VENTRAL ADULT, open;  Surgeon: Desiderio Schanz, MD;  Location: [facility] OR[facility];  Service: General;  Laterality: N/A;   VISCERAL ANGIOGRAPHY N/A 04/06/2024   Procedure: VISCERAL ANGIOGRAPHY;  Surgeon: Gu Selinda GORMAN, MD;  Location: [facility];  Service: Cardiovascular;  Laterality: N/A;     Current Outpatient Medications  Medication Sig Dispense Refill   albuterol  (VENTOLIN  HFA) 108 (90 Base) MCG/ACT inhaler Inhale 2 puffs into the lungs every 4 (four) hours as needed for wheezing or shortness of breath. 9 g 1   alum & mag hydroxide-simeth (MAALOX/MYLANTA) 200-200-20 MG/5ML suspension Take 30 mLs by mouth every 6 (six) hours as needed for indigestion or heartburn. 355 mL 0   aspirin  EC 81 MG EC tablet Take 1 tablet (81 mg total) by mouth daily. 90 tablet 1   atorvastatin  (LIPITOR ) 80 MG tablet Take 1 tablet (80 mg total) by mouth daily. 90 tablet 3   budesonide -formoterol  (SYMBICORT ) 80-4.5 MCG/ACT inhaler INHALE 2 PUFFS ONCE DAILY 22 g 2   Cholecalciferol  (VITAMIN D -3 PO) Take 1 tablet by mouth daily.     clopidogrel  (PLAVIX ) 75 MG tablet Take 1 tablet (75 mg total) by mouth daily. 30 tablet 0   cyclobenzaprine  (FLEXERIL ) 5 MG tablet Take 1 tablet (5 mg total) by mouth at bedtime as needed for muscle spasms. 30 tablet 3   ezetimibe  (ZETIA ) 10 MG tablet Take 1 tablet (10 mg total) by mouth daily. 90 tablet 3   ferrous sulfate  325 (65 FE) MG tablet Take 325 mg by mouth daily.     gabapentin  (NEURONTIN ) 100 MG capsule Take 1 capsule (100 mg total) by mouth 3 (three) times daily. 90 capsule 0   losartan  (COZAAR ) 50 MG tablet Take 2 tablets (100 mg total) by mouth daily. 180 tablet 3   ondansetron   (ZOFRAN -ODT) 4 MG disintegrating tablet Take 1 tablet (4 mg total) by mouth every 8 (eight) hours as needed for nausea or vomiting. 12 tablet 0   oxyCODONE  (ROXICODONE ) 5 MG immediate release tablet Take 1 tablet (5 mg total) by mouth daily as needed for severe pain (pain score 7-10). 30 tablet 0   thiamine  (VITAMIN B-1) 100 MG tablet Take 1 tablet (100 mg total) by mouth daily. 15 tablet 0   metoprolol  succinate (TOPROL -XL) 50 MG 24 hr tablet Take 1 tablet (50 mg total) by mouth daily. Take with or immediately following a meal. 90 tablet 0   No current facility-administered medications for this visit.    Allergies:   Tylenol  [acetaminophen ],  Aspirin , Zyrtec [cetirizine], and Tramadol    Social History:   reports that she has been smoking cigarettes. She has been exposed to tobacco smoke. She has never used smokeless tobacco. She reports current alcohol use. She reports that she does not use drugs.   Family History:  family history includes Hypertension in her father and mother.    ROS:     Review of Systems  Constitutional: Negative.   HENT: Negative.    Eyes: Negative.   Respiratory: Negative.    Gastrointestinal: Negative.   Genitourinary: Negative.   Musculoskeletal: Negative.   Skin: Negative.   Neurological: Negative.   Endo/Heme/Allergies: Negative.   Psychiatric/Behavioral: Negative.    All other systems reviewed and are negative.     All other systems are reviewed and negative.    PHYSICAL EXAM: VS:  BP 120/70   Pulse 65   Ht 5' 4 (1.626 m)   Wt 98 lb 9.6 oz (44.7 kg)   LMP  (LMP Unknown)   SpO2 100%   BMI 16.92 kg/m  , BMI Body mass index is 16.92 kg/m. Last weight:  Wt Readings from Last 3 Encounters:  04/17/24 98 lb 9.6 oz (44.7 kg)  04/13/24 102 lb 12.8 oz (46.6 kg)  04/06/24 109 lb 5.6 oz (49.6 kg)     Physical Exam Constitutional:      Appearance: Normal appearance.  Cardiovascular:     Rate and Rhythm: Normal rate and regular rhythm.      Heart sounds: Normal heart sounds.  Pulmonary:     Effort: Pulmonary effort is normal.     Breath sounds: Normal breath sounds.  Musculoskeletal:     Right lower leg: No edema.     Left lower leg: No edema.  Neurological:     Mental Status: She is alert.       EKG:   Recent Labs: 04/05/2024: ALT 16 04/09/2024: Hemoglobin 9.2; Platelets 226 04/10/2024: BUN 10; Creatinine, Ser 0.75; Magnesium  1.9; Potassium 3.6; Sodium 138    Lipid Panel    Component Value Date/Time   CHOL 202 (H) 06/18/2023 0707   CHOL 149 05/28/2022 1103   CHOL 147 10/16/2013 0439   TRIG 186 (H) 06/18/2023 0707   TRIG 122 10/16/2013 0439   HDL 61 06/18/2023 0707   HDL 79 05/28/2022 1103   HDL 31 (L) 10/16/2013 0439   CHOLHDL 3.3 06/18/2023 0707   VLDL 37 06/18/2023 0707   VLDL 24 10/16/2013 0439   LDLCALC 104 (H) 06/18/2023 0707   LDLCALC 55 05/28/2022 1103   LDLCALC 92 10/16/2013 0439      Other studies Reviewed: Additional studies/ records that were reviewed today include:  Review of the above records demonstrates:       No data to display            ASSESSMENT AND PLAN:    ICD-10-CM   1. Sinus tachycardia  R00.0 metoprolol  succinate (TOPROL -XL) 50 MG 24 hr tablet   Will refil metoprolol  as ran out.    2. Coronary artery disease due to lipid rich plaque  I25.10    I25.83     3. Bilateral carotid artery stenosis  I65.23     4. Essential hypertension  I10     5. Mesenteric ischemia (HCC)  K55.9    Had stent of SMA as had Illeus.    6. AKI (acute kidney injury) (HCC)  N17.9        Problem List Items Addressed This Visit  Cardiovascular and Mediastinum   Essential hypertension   Relevant Medications   metoprolol  succinate (TOPROL -XL) 50 MG 24 hr tablet   Carotid stenosis   Relevant Medications   metoprolol  succinate (TOPROL -XL) 50 MG 24 hr tablet   Coronary artery disease due to lipid rich plaque   Relevant Medications   metoprolol  succinate (TOPROL -XL) 50 MG 24 hr  tablet   Mesenteric ischemia (HCC)   Relevant Medications   metoprolol  succinate (TOPROL -XL) 50 MG 24 hr tablet     Genitourinary   AKI (acute kidney injury) (HCC)     Other   Sinus tachycardia - Primary   Relevant Medications   metoprolol  succinate (TOPROL -XL) 50 MG 24 hr tablet       Disposition:   Return in about 3 months (around 07/18/2024).    Total time spent: 30 minutes  Signed,  Denyse Bathe, MD  04/17/2024 11:16 AM    [facility]

## 2024-04-24 ENCOUNTER — Encounter: Payer: Self-pay | Admitting: Nurse Practitioner

## 2024-04-24 ENCOUNTER — Ambulatory Visit: Admitting: Nurse Practitioner

## 2024-04-24 VITALS — BP 135/72 | HR 81 | Temp 95.3°F | Resp 16 | Ht 64.0 in | Wt 98.2 lb

## 2024-04-24 DIAGNOSIS — F10280 Alcohol dependence with alcohol-induced anxiety disorder: Secondary | ICD-10-CM

## 2024-04-24 DIAGNOSIS — K921 Melena: Secondary | ICD-10-CM | POA: Diagnosis not present

## 2024-04-24 DIAGNOSIS — R531 Weakness: Secondary | ICD-10-CM

## 2024-04-24 DIAGNOSIS — F3342 Major depressive disorder, recurrent, in full remission: Secondary | ICD-10-CM

## 2024-04-24 DIAGNOSIS — R638 Other symptoms and signs concerning food and fluid intake: Secondary | ICD-10-CM

## 2024-04-24 DIAGNOSIS — R1114 Bilious vomiting: Secondary | ICD-10-CM | POA: Diagnosis not present

## 2024-04-24 DIAGNOSIS — R7301 Impaired fasting glucose: Secondary | ICD-10-CM | POA: Diagnosis not present

## 2024-04-24 NOTE — Progress Notes (Signed)
 [facility] [address] [place], [place] [address]  Internal MEDICINE  Office Visit Note  Patient Name: [name]  [id number]  [id number]  Date of Service: 04/24/2024  Chief Complaint  Patient presents with   Acute Visit    Unable to eat, weak, lightheaded after hospital.      HPI [name] presents for an acute sick visit for poor appetite and weakness after hospital stay.  Poor appetite -- trouble eating since hospital stay, abdominal pain and tenderness related to surgical procedure.  Generalized weakness  Dizzy and lightheaded -- most likely due to mild dehydration and poor appetite.     Current Medication:  Outpatient Encounter Medications as of 04/24/2024  Medication Sig   albuterol  (VENTOLIN  HFA) 108 (90 Base) MCG/ACT inhaler Inhale 2 puffs into the lungs every 4 (four) hours as needed for wheezing or shortness of breath.   alum & mag hydroxide-simeth (MAALOX/MYLANTA) 200-200-20 MG/5ML suspension Take 30 mLs by mouth every 6 (six) hours as needed for indigestion or heartburn.   aspirin  EC 81 MG EC tablet Take 1 tablet (81 mg total) by mouth daily.   atorvastatin  (LIPITOR ) 80 MG tablet Take 1 tablet (80 mg total) by mouth daily.   budesonide -formoterol  (SYMBICORT ) 80-4.5 MCG/ACT inhaler INHALE 2 PUFFS ONCE DAILY   Cholecalciferol  (VITAMIN D -3 PO) Take 1 tablet by mouth daily.   [EXPIRED] clopidogrel  (PLAVIX ) 75 MG tablet Take 1 tablet (75 mg total) by mouth daily.   cyclobenzaprine  (FLEXERIL ) 5 MG tablet Take 1 tablet (5 mg total) by mouth at bedtime as needed for muscle spasms.   ezetimibe  (ZETIA ) 10 MG tablet Take 1 tablet (10 mg total) by mouth daily.   ferrous sulfate  325 (65 FE) MG tablet Take 325 mg by mouth daily.   gabapentin  (NEURONTIN ) 100 MG capsule Take 1 capsule (100 mg total) by mouth 3 (three) times daily.   losartan  (COZAAR ) 50 MG tablet Take 2 tablets (100 mg total) by mouth daily.   metoprolol  succinate (TOPROL -XL) 50 MG 24 hr tablet  Take 1 tablet (50 mg total) by mouth daily. Take with or immediately following a meal.   ondansetron  (ZOFRAN -ODT) 4 MG disintegrating tablet Take 1 tablet (4 mg total) by mouth every 8 (eight) hours as needed for nausea or vomiting.   thiamine  (VITAMIN B-1) 100 MG tablet Take 1 tablet (100 mg total) by mouth daily.   [DISCONTINUED] oxyCODONE  (ROXICODONE ) 5 MG immediate release tablet Take 1 tablet (5 mg total) by mouth daily as needed for severe pain (pain score 7-10).   No facility-administered encounter medications on file as of 04/24/2024.      Medical History: Past Medical History:  Diagnosis Date   Adenocarcinoma of upper lobe of right lung (HCC)    a.) s/p posterior segmentectomy on 03/20/23 at [facility]   Anemia    Arthritis    Asthma    Carotid artery stenosis    a.) doppler 05/14/2017: 1-39% BICA; b.) doppler 04/05/2018: 50-69% BICA; c.) doppler 06/12/2019: 40-59% BICA; d.) doppler 06/14/2020, 06/14/2021: 1-39% BICA; e.) doppler 06/19/2022: 1.39% RICA, 40-59% LICA   Colon polyp    COPD (chronic obstructive pulmonary disease) (HCC)    Coronary artery disease 04/03/2006   a.) LHC 04/03/2006: 25% mLM, 25% mLAD, 50% D1, 25/50% mLCx, 50% dLCx, 25% OM2, 100% pRCA --> med mgmt; b.) LHC 04/25/2010: 25% pLM, 25% pLAD, 50% pLCx, 70% dLCx, 100% pRCA --> med mgmt   Depression    Diastolic dysfunction    a.) TTE 04/05/2018:  EF 65%, no RWMAs, G1DD, triv MR/TR; b.) TTE 10/15/2022: EF 88%, mod LVH, G1DD, triv TR, mild MR; c.) TTE 02/27/2023: EF 65%, no RWMAs, triv TR/MR, RVSP 35; d.) TTE 03/21/2023: EF 65%, no RWMAs, norm RVSF, PASP 44   Environmental allergies    Headache    Hyperlipidemia    Hypertension    Hypoxic respiratory failure (HCC)    Internal hemorrhoids    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[facility])    a.) thinks it was in 2007 --> LHC 04/03/2006 revealed a 100% pRCA --> medically managed   Necrotizing pneumonia (HCC)    Pulmonary HTN (HCC)    a.) TTE 02/27/2023: RVSP 35 mmHg; b.) TTE 03/21/2023:  PASP 44 mmHg   PVD (peripheral vascular disease) with claudication    Shortness of breath dyspnea    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 Negative for chills, fatigue and unexpected weight change.  HENT:  Negative for congestion, rhinorrhea, sneezing and sore throat.   Eyes:  Negative for redness.  Respiratory: Negative.  Negative for cough, chest tightness, shortness of breath and wheezing.   Cardiovascular: Negative.  Negative for chest pain and palpitations.  Gastrointestinal:  Negative for abdominal pain, constipation, diarrhea, nausea and vomiting.  Genitourinary:  Negative for dysuria and frequency.  Musculoskeletal:  Negative for arthralgias, back pain, joint swelling and neck pain.  Skin:  Negative for rash.  Neurological: Negative.  Negative for tremors and numbness.  Hematological:  Negative for adenopathy. Does not bruise/bleed easily.  Psychiatric/Behavioral:  Positive for sleep disturbance. Negative for behavioral problems (Depression), self-injury and suicidal ideas. The patient is not nervous/anxious.     Physical Exam Vitals reviewed.  Constitutional:      Appearance: Normal appearance. She is normal weight.  HENT:     Head: Normocephalic and atraumatic.  Eyes:     Pupils: Pupils are equal, round, and reactive to light.  Cardiovascular:     Rate and Rhythm: Normal rate and regular rhythm.  Pulmonary:     Effort: Pulmonary effort is normal. No respiratory distress.  Neurological:      Mental Status: She is alert and oriented to person, place, and time.  Psychiatric:        Mood and Affect: Mood normal.        Behavior: Behavior normal.       Assessment/Plan: 1. Unable to eat (Primary) Additional labs ordered - CBC with Differential/Platelet - CMP14+EGFR - Magnesium  - Hgb A1C w/o eAG - Iron, TIBC and Ferritin Panel - B12 and Folate Panel - Vitamin D  (25 hydroxy) - Fecal occult blood, imunochemical  2. Bilious vomiting with nausea Additional labs ordered  - CBC with Differential/Platelet - CMP14+EGFR - Magnesium  - Hgb A1C w/o eAG - Iron, TIBC and Ferritin Panel - B12 and Folate Panel - Vitamin D  (25 hydroxy) - Fecal occult  blood, imunochemical  3. Generalized weakness Additional labs ordered  - CBC with Differential/Platelet - CMP14+EGFR - Magnesium  - Hgb A1C w/o eAG - Iron, TIBC and Ferritin Panel - B12 and Folate Panel - Vitamin D  (25 hydroxy) - Fecal occult blood, imunochemical  4. Black tarry stools Additional labs ordered - CBC with Differential/Platelet - CMP14+EGFR - Magnesium  - Hgb A1C w/o eAG - Iron, TIBC and Ferritin Panel - B12 and Folate Panel - Vitamin D  (25 hydroxy) - Fecal occult blood, imunochemical  5. Blood in stool Additional labs ordered  - CBC with Differential/Platelet - CMP14+EGFR - Magnesium  - Hgb A1C w/o eAG - Iron, TIBC and Ferritin Panel - B12 and Folate Panel - Vitamin D  (25 hydroxy) - Fecal occult blood, imunochemical   6. 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 MDD (major depressive disorder), recurrent, in remission.  Not currently on any antidepressants.    General Counseling: Kelise verbalizes understanding of the findings of todays visit and agrees with plan of treatment. I have discussed any further diagnostic evaluation that may be needed or ordered  today. We also reviewed her medications today. she has been encouraged to call the office with any questions or concerns that should arise related to todays visit.    Counseling:    Orders Placed This Encounter  Procedures   Fecal occult blood, imunochemical   CBC with Differential/Platelet   CMP14+EGFR   Magnesium    Hgb A1C w/o eAG   Iron, TIBC and Ferritin Panel   B12 and Folate Panel   Vitamin D  (25 hydroxy)    No orders of the defined types were placed in this encounter.   Return if symptoms worsen or fail to improve, for has upcoming AWV in november.  [place] Controlled Substance Database was reviewed by me for overdose risk score (ORS)  Time spent:30 Minutes Time spent with patient included reviewing progress notes, labs, imaging studies, and discussing plan for follow up.   This patient was seen by Mardy Maxin, FNP-C in collaboration with Dr. Sigrid Bathe as a part of collaborative care agreement.  Marlo Goodrich R. Maxin, MSN, FNP-C Internal Medicine

## 2024-04-25 LAB — IRON,TIBC AND FERRITIN PANEL
Ferritin: 47 ng/mL (ref 15–150)
Iron Saturation: 71 % — ABNORMAL HIGH (ref 15–55)
Iron: 308 ug/dL (ref 27–139)
Total Iron Binding Capacity: 434 ug/dL (ref 250–450)
UIBC: 126 ug/dL (ref 118–369)

## 2024-04-25 LAB — CBC WITH DIFFERENTIAL/PLATELET
Basophils Absolute: 0 x10E3/uL (ref 0.0–0.2)
Basos: 0 %
EOS (ABSOLUTE): 0.1 x10E3/uL (ref 0.0–0.4)
Eos: 1 %
Hematocrit: 34.1 % (ref 34.0–46.6)
Hemoglobin: 10.9 g/dL — ABNORMAL LOW (ref 11.1–15.9)
Immature Grans (Abs): 0 x10E3/uL (ref 0.0–0.1)
Immature Granulocytes: 0 %
Lymphocytes Absolute: 1.3 x10E3/uL (ref 0.7–3.1)
Lymphs: 26 %
MCH: 31.8 pg (ref 26.6–33.0)
MCHC: 32 g/dL (ref 31.5–35.7)
MCV: 99 fL — ABNORMAL HIGH (ref 79–97)
Monocytes Absolute: 0.4 x10E3/uL (ref 0.1–0.9)
Monocytes: 7 %
Neutrophils Absolute: 3.4 x10E3/uL (ref 1.4–7.0)
Neutrophils: 66 %
Platelets: 318 x10E3/uL (ref 150–450)
RBC: 3.43 x10E6/uL — ABNORMAL LOW (ref 3.77–5.28)
RDW: 13 % (ref 11.7–15.4)
WBC: 5.2 x10E3/uL (ref 3.4–10.8)

## 2024-04-25 LAB — CMP14+EGFR
ALT: 11 IU/L (ref 0–32)
AST: 16 IU/L (ref 0–40)
Albumin: 4.4 g/dL (ref 3.9–4.9)
Alkaline Phosphatase: 80 IU/L (ref 44–121)
BUN/Creatinine Ratio: 11 — ABNORMAL LOW (ref 12–28)
BUN: 9 mg/dL (ref 8–27)
Bilirubin Total: 0.3 mg/dL (ref 0.0–1.2)
CO2: 20 mmol/L (ref 20–29)
Calcium: 10.7 mg/dL — ABNORMAL HIGH (ref 8.7–10.3)
Chloride: 103 mmol/L (ref 96–106)
Creatinine, Ser: 0.79 mg/dL (ref 0.57–1.00)
Globulin, Total: 3 g/dL (ref 1.5–4.5)
Glucose: 93 mg/dL (ref 70–99)
Potassium: 4.4 mmol/L (ref 3.5–5.2)
Sodium: 139 mmol/L (ref 134–144)
Total Protein: 7.4 g/dL (ref 6.0–8.5)
eGFR: 81 mL/min/1.73 (ref >59)

## 2024-04-25 LAB — HGB A1C W/O EAG: Hgb A1c MFr Bld: 5.2 % (ref 4.8–5.6)

## 2024-04-25 LAB — MAGNESIUM: Magnesium: 1.7 mg/dL (ref 1.6–2.3)

## 2024-04-25 LAB — B12 AND FOLATE PANEL
Folate: 7.4 ng/mL (ref >3.0)
Vitamin B-12: 419 pg/mL (ref 232–1245)

## 2024-04-25 LAB — VITAMIN D 25 HYDROXY (VIT D DEFICIENCY, FRACTURES): Vit D, 25-Hydroxy: 37.6 ng/mL (ref 30.0–100.0)

## 2024-04-29 LAB — FECAL OCCULT BLOOD, IMMUNOCHEMICAL: Fecal Occult Bld: POSITIVE — AB

## 2024-05-12 ENCOUNTER — Other Ambulatory Visit (INDEPENDENT_AMBULATORY_CARE_PROVIDER_SITE_OTHER): Payer: Self-pay | Admitting: Vascular Surgery

## 2024-05-12 DIAGNOSIS — I774 Celiac artery compression syndrome: Secondary | ICD-10-CM

## 2024-05-15 ENCOUNTER — Ambulatory Visit (INDEPENDENT_AMBULATORY_CARE_PROVIDER_SITE_OTHER)

## 2024-05-15 ENCOUNTER — Ambulatory Visit (INDEPENDENT_AMBULATORY_CARE_PROVIDER_SITE_OTHER): Admitting: Nurse Practitioner

## 2024-05-15 ENCOUNTER — Encounter (INDEPENDENT_AMBULATORY_CARE_PROVIDER_SITE_OTHER): Payer: Self-pay | Admitting: Nurse Practitioner

## 2024-05-15 VITALS — BP 141/84 | HR 74 | Resp 16 | Wt 97.4 lb

## 2024-05-15 DIAGNOSIS — I10 Essential (primary) hypertension: Secondary | ICD-10-CM

## 2024-05-15 DIAGNOSIS — E782 Mixed hyperlipidemia: Secondary | ICD-10-CM | POA: Diagnosis not present

## 2024-05-15 DIAGNOSIS — I774 Celiac artery compression syndrome: Secondary | ICD-10-CM | POA: Diagnosis not present

## 2024-05-15 DIAGNOSIS — F17200 Nicotine dependence, unspecified, uncomplicated: Secondary | ICD-10-CM

## 2024-05-15 DIAGNOSIS — K559 Vascular disorder of intestine, unspecified: Secondary | ICD-10-CM

## 2024-05-16 ENCOUNTER — Encounter (INDEPENDENT_AMBULATORY_CARE_PROVIDER_SITE_OTHER): Payer: Self-pay | Admitting: Nurse Practitioner

## 2024-05-16 NOTE — H&P (View-Only) (Signed)
 Subjective:    Patient ID: [name], female    DOB: 07-Jan-1956, 68 y.o.   MRN: [id number] Chief Complaint  Patient presents with   Follow-up    Mesenteric u/s follow up    The patient returns to the office for follow-up regarding chronic mesenteric ischemia.  The patient is status post angiography with intervention of the celiac artery.  Procedure date 04/06/2024:   Procedure(s) Performed:             1.  Ultrasound guidance for vascular access right femoral artery              2.  Catheter placement into SMA and the celiac artery from right femoral approach             3.  Aortogram and selective angiogram of the SMA and the celiac artery             4.  Stent to the celiac artery with 6 mm diameter x 37 mm length balloon expandable stent             5.  StarClose closure device right femoral artery   The patient reports that since the intervention the abdominal pain and postprandial symptoms are no better.  She says she continues to have food phobia and difficulty eating.  She also notes that she has been abdominal pain with activity.  No history of peptic ulcer disease.   No prior peripheral angiograms or vascular interventions.  The patient denies amaurosis fugax or recent TIA symptoms. There are no recent neurological changes noted. The patient denies claudication symptoms or rest pain symptoms. The patient denies history of DVT, PE or superficial thrombophlebitis. The patient denies recent episodes of angina  She has a known SMA occlusion.  Pulses today show patent stent but suggested 70 to 99% stenosis      Review of Systems  Gastrointestinal:  Positive for abdominal pain.  Neurological:  Positive for weakness.  All other systems reviewed and are negative.      Objective:   Physical Exam Vitals reviewed.  HENT:     Head: Normocephalic.  Cardiovascular:     Rate and Rhythm: Normal rate.  Pulmonary:     Effort: Pulmonary effort is normal.  Skin:     General: Skin is warm and dry.  Neurological:     Mental Status: She is alert and oriented to person, place, and time.  Psychiatric:        Mood and Affect: Mood normal.        Behavior: Behavior normal.        Thought Content: Thought content normal.        Judgment: Judgment normal.     BP (!) 141/84   Pulse 74   Resp 16   Wt 97 lb 6.4 oz (44.2 kg)   LMP  (LMP Unknown)   BMI 16.72 kg/m   Past Medical History:  Diagnosis Date   Adenocarcinoma of upper lobe of right lung (HCC)    a.) s/p posterior segmentectomy on 03/20/23 at [facility]   Anemia    Arthritis    Asthma    Carotid artery stenosis    a.) doppler 05/14/2017: 1-39% BICA; b.) doppler 04/05/2018: 50-69% BICA; c.) doppler 06/12/2019: 40-59% BICA; d.) doppler 06/14/2020, 06/14/2021: 1-39% BICA; e.) doppler 06/19/2022: 1.39% RICA, 40-59% LICA   Colon polyp    COPD (chronic obstructive pulmonary disease) (HCC)    Coronary artery disease 04/03/2006   a.) LHC  04/03/2006: 25% mLM, 25% mLAD, 50% D1, 25/50% mLCx, 50% dLCx, 25% OM2, 100% pRCA --> med mgmt; b.) LHC 04/25/2010: 25% pLM, 25% pLAD, 50% pLCx, 70% dLCx, 100% pRCA --> med mgmt   Depression    Diastolic dysfunction    a.) TTE 04/05/2018: EF 65%, no RWMAs, G1DD, triv MR/TR; b.) TTE 10/15/2022: EF 88%, mod LVH, G1DD, triv TR, mild MR; c.) TTE 02/27/2023: EF 65%, no RWMAs, triv TR/MR, RVSP 35; d.) TTE 03/21/2023: EF 65%, no RWMAs, norm RVSF, PASP 44   Environmental allergies    Headache    Hyperlipidemia    Hypertension    Hypoxic respiratory failure (HCC)    Internal hemorrhoids    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[facility])    a.) thinks it was in 2007 --> LHC 04/03/2006 revealed a 100% pRCA --> medically managed   Necrotizing pneumonia (HCC)    Pulmonary HTN (HCC)    a.) TTE 02/27/2023: RVSP 35 mmHg; b.) TTE 03/21/2023: PASP 44 mmHg   PVD (peripheral vascular disease) with claudication (HCC)    Shortness of breath dyspnea    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     Substance abuse (HCC)    Substance induced mood disorder (HCC)    Suicidal ideation    Thrombocytopenia (HCC)    Tobacco abuse    Unstable angina (HCC)    Ventral hernia    Vitamin D  deficiency     Social History   Socioeconomic History   Marital status: Single    Spouse name: Not on file   Number of children: Not on file   Years of education: Not on file   Highest education level: Not on file  Occupational History   Not on file  Tobacco Use   Smoking status: Every Day    Current packs/day: 1.00    Types: Cigarettes    Passive exposure: Past   Smokeless tobacco: Never  Vaping Use   Vaping status: Never Used  Substance and Sexual Activity   Alcohol use: Not Currently    Comment: weekends, couple beers, pint of liquer only on weekends.  Last drank approx 1 month ago.   Drug use: No   Sexual activity: Not Currently  Other Topics Concern   Not on file  Social History Narrative   Lives with family    Social Drivers of Health   Financial Resource Strain: Low Risk  (03/21/2023)   [facility]   Overall Financial Resource Strain (CARDIA)    Difficulty of Paying Living Expenses: Not hard at all  Food Insecurity: No Food Insecurity (04/13/2024)   Hunger Vital Sign    Worried About Running Out of Food in the Last Year: Never true    Ran Out of Food in the Last Year: Never true  Transportation Needs: No Transportation Needs (04/13/2024)   PRAPARE - Administrator, Civil Service (Medical): No    Lack of Transportation (Non-Medical): No  Physical Activity: Inactive (04/04/2018)   Exercise Vital Sign    Days of Exercise per Week: 0 days    Minutes of Exercise per Session: 0 min  Stress: No Stress Concern Present (04/04/2018)   [employer] of Occupational Health - Occupational Stress Questionnaire    Feeling of Stress : Only a little  Social Connections: Socially Isolated (04/06/2024)   Social Connection and Isolation Panel    Frequency of  Communication with Friends and Family: Once a week    Frequency of Social Gatherings with Friends  and Family: Once a week    Attends Religious Services: 1 to 4 times per year    Active Member of Clubs or Organizations: No    Attends Banker Meetings: Never    Marital Status: Never married  Intimate Partner Violence: Not At Risk (04/13/2024)   Humiliation, Afraid, Rape, and Kick questionnaire    Fear of Current or Ex-Partner: No    Emotionally Abused: No    Physically Abused: No    Sexually Abused: No    Past Surgical History:  Procedure Laterality Date   ABDOMINAL HYSTERECTOMY     APPENDECTOMY     CARDIAC CATHETERIZATION     CARDIAC SURGERY     CHOLECYSTECTOMY     COLONOSCOPY  12/15/2011   OH->bleeding internal hemorrhoids, otherwise normal   COLONOSCOPY WITH PROPOFOL  N/A 04/12/2015   Procedure: COLONOSCOPY WITH PROPOFOL ;  Surgeon: Rogelia Copping, MD;  Location: [facility];  Service: Endoscopy;  Laterality: N/A;   COLONOSCOPY WITH PROPOFOL  N/A 01/05/2020   Procedure: COLONOSCOPY WITH PROPOFOL ;  Surgeon: Copping Rogelia, MD;  Location: [facility];  Service: Endoscopy;  Laterality: N/A;   ENDARTERECTOMY FEMORAL Left 04/26/2015   Procedure: ENDARTERECTOMY FEMORAL;  Surgeon: Selinda GORMAN Gu, MD;  Location: [facility];  Service: Vascular;  Laterality: Left;   ENDOVASCULAR STENT INSERTION Bilateral    Legs   FLEXIBLE BRONCHOSCOPY N/A 02/02/2015   Procedure: FLEXIBLE BRONCHOSCOPY;  Surgeon: Elfreda DELENA Bathe, MD;  Location: [facility];  Service: Pulmonary;  Laterality: N/A;   HEMORRHOID SURGERY     lung mass removed     PERIPHERAL VASCULAR CATHETERIZATION Left 04/25/2015   Procedure: Lower Extremity Angiography;  Surgeon: Selinda GORMAN Gu, MD;  Location: [facility];  Service: Cardiovascular;  Laterality: Left;   PERIPHERAL VASCULAR CATHETERIZATION Left 04/25/2015   Procedure: Lower Extremity Intervention;  Surgeon: Selinda GORMAN Gu, MD;  Location: [facility];  Service:  Cardiovascular;  Laterality: Left;   PERIPHERAL VASCULAR CATHETERIZATION Left 01/02/2016   Procedure: Lower Extremity Angiography;  Surgeon: Selinda GORMAN Gu, MD;  Location: [facility];  Service: Cardiovascular;  Laterality: Left;   PERIPHERAL VASCULAR CATHETERIZATION  01/02/2016   Procedure: Lower Extremity Intervention;  Surgeon: Selinda GORMAN Gu, MD;  Location: [facility];  Service: Cardiovascular;;   THROMBECTOMY FEMORAL ARTERY Left 04/26/2015   Procedure: THROMBECTOMY FEMORAL ARTERY;  Surgeon: Selinda GORMAN Gu, MD;  Location: [facility];  Service: Vascular;  Laterality: Left;   VENTRAL HERNIA REPAIR N/A 07/25/2023   Procedure: HERNIA REPAIR VENTRAL ADULT, open;  Surgeon: Desiderio Schanz, MD;  Location: [facility];  Service: General;  Laterality: N/A;   VISCERAL ANGIOGRAPHY N/A 04/06/2024   Procedure: VISCERAL ANGIOGRAPHY;  Surgeon: Gu Selinda GORMAN, MD;  Location: [facility];  Service: Cardiovascular;  Laterality: N/A;    Family History  Problem Relation Age of Onset   Hypertension Mother    Hypertension Father     Allergies  Allergen Reactions   Tylenol  [Acetaminophen ] Anaphylaxis   Aspirin  Nausea And Vomiting and Other (See Comments)    Pt states aspirin  makes her cramp and have to use coated kind.   Zyrtec [Cetirizine] Other (See Comments)    Upset stomach   Tramadol Rash       Latest Ref Rng & Units 04/24/2024    1:30 PM 04/09/2024    4:35 AM 04/08/2024    4:27 AM  CBC  WBC 3.4 - 10.8 x10E3/uL 5.2  4.6  4.6   Hemoglobin 11.1 -  15.9 g/dL 89.0  9.2  8.7   Hematocrit 34.0 - 46.6 % 34.1  27.4  25.5   Platelets 150 - 450 x10E3/uL 318  226  201       CMP     Component Value Date/Time   NA 139 04/24/2024 1330   NA 131 (L) 10/15/2013 1123   K 4.4 04/24/2024 1330   K 3.6 10/15/2013 1123   CL 103 04/24/2024 1330   CL 102 10/15/2013 1123   CO2 20 04/24/2024 1330   CO2 24 10/15/2013 1123   GLUCOSE 93 04/24/2024 1330   GLUCOSE 112 (H) 04/10/2024 0435   GLUCOSE 111  (H) 10/15/2013 1123   BUN 9 04/24/2024 1330   BUN 12 10/15/2013 1123   CREATININE 0.79 04/24/2024 1330   CREATININE 1.20 10/15/2013 1123   CALCIUM  10.7 (H) 04/24/2024 1330   CALCIUM  9.1 10/15/2013 1123   PROT 7.4 04/24/2024 1330   ALBUMIN 4.4 04/24/2024 1330   AST 16 04/24/2024 1330   ALT 11 04/24/2024 1330   ALKPHOS 80 04/24/2024 1330   BILITOT 0.3 04/24/2024 1330   EGFR 81 04/24/2024 1330   GFRNONAA >60 04/10/2024 0435   GFRNONAA 50 (L) 10/15/2013 1123     No results found.     Assessment & Plan:   1. Mesenteric ischemia (HCC) (Primary) Recommend:  The patient has evidence of severe atherosclerotic changes of the mesenteric arteries associated with weight loss as well as abdominal pain and N/V.  This represents a high risk for bowel infarction and death.  Patient should undergo angiography of the mesenteric arteries with the hope for intervention to eliminate the ischemic symptoms.    The risks and benefits as well as the alternative therapies was discussed in detail with the patient.  All questions were answered.  Patient agrees to proceed with angiography and intervention.  The patient will follow up with me after the angiogram.  2. Essential hypertension Continue antihypertensive medications as already ordered, these medications have been reviewed and there are no changes at this time.  3. Mixed hyperlipidemia Continue statin as ordered and reviewed, no changes at this time  4. Tobacco use disorder Smoking cessation was discussed, 3-10 minutes spent on this topic specifically   Current Outpatient Medications on File Prior to Visit  Medication Sig Dispense Refill   albuterol  (VENTOLIN  HFA) 108 (90 Base) MCG/ACT inhaler Inhale 2 puffs into the lungs every 4 (four) hours as needed for wheezing or shortness of breath. 9 g 1   alum & mag hydroxide-simeth (MAALOX/MYLANTA) 200-200-20 MG/5ML suspension Take 30 mLs by mouth every 6 (six) hours as needed for indigestion or  heartburn. 355 mL 0   aspirin  EC 81 MG EC tablet Take 1 tablet (81 mg total) by mouth daily. 90 tablet 1   atorvastatin  (LIPITOR ) 80 MG tablet Take 1 tablet (80 mg total) by mouth daily. 90 tablet 3   budesonide -formoterol  (SYMBICORT ) 80-4.5 MCG/ACT inhaler INHALE 2 PUFFS ONCE DAILY 22 g 2   Cholecalciferol  (VITAMIN D -3 PO) Take 1 tablet by mouth daily.     cyclobenzaprine  (FLEXERIL ) 5 MG tablet Take 1 tablet (5 mg total) by mouth at bedtime as needed for muscle spasms. 30 tablet 3   ezetimibe  (ZETIA ) 10 MG tablet Take 1 tablet (10 mg total) by mouth daily. 90 tablet 3   ferrous sulfate  325 (65 FE) MG tablet Take 325 mg by mouth daily.     gabapentin  (NEURONTIN ) 100 MG capsule Take 1 capsule (100 mg total) by  mouth 3 (three) times daily. 90 capsule 0   losartan  (COZAAR ) 50 MG tablet Take 2 tablets (100 mg total) by mouth daily. 180 tablet 3   metoprolol  succinate (TOPROL -XL) 50 MG 24 hr tablet Take 1 tablet (50 mg total) by mouth daily. Take with or immediately following a meal. 90 tablet 0   ondansetron  (ZOFRAN -ODT) 4 MG disintegrating tablet Take 1 tablet (4 mg total) by mouth every 8 (eight) hours as needed for nausea or vomiting. 12 tablet 0   oxyCODONE  (ROXICODONE ) 5 MG immediate release tablet Take 1 tablet (5 mg total) by mouth daily as needed for severe pain (pain score 7-10). 30 tablet 0   thiamine  (VITAMIN B-1) 100 MG tablet Take 1 tablet (100 mg total) by mouth daily. 15 tablet 0   No current facility-administered medications on file prior to visit.    There are no Patient Instructions on file for this visit. No follow-ups on file.   Cammy Sanjurjo E Even Budlong, NP

## 2024-05-16 NOTE — Progress Notes (Incomplete)
 Subjective:    Patient ID: [name], female    DOB: 04-30-1956, 68 y.o.   MRN: [id number] Chief Complaint  Patient presents with  . Follow-up    Mesenteric u/s follow up    The patient returns to the office for follow-up regarding chronic mesenteric ischemia.  The patient is status post angiography with intervention of the ***.  Procedure date ***:   ***  The patient reports that since the intervention the abdominal pain and postprandial symptoms are much better.  The patient denies further weight loss and the previous chronic nausea is improved.  The patient does not substantiate food fear.  The patient denies bloody bowel movements or diarrhea.    No history of peptic ulcer disease.   No prior peripheral angiograms or vascular interventions.  The patient denies amaurosis fugax or recent TIA symptoms. There are no recent neurological changes noted. The patient denies claudication symptoms or rest pain symptoms. The patient denies history of DVT, PE or superficial thrombophlebitis. The patient denies recent episodes of angina     Review of Systems     Objective:   Physical Exam  BP (!) 141/84   Pulse 74   Resp 16   Wt 97 lb 6.4 oz (44.2 kg)   LMP  (LMP Unknown)   BMI 16.72 kg/m   Past Medical History:  Diagnosis Date  . Adenocarcinoma of upper lobe of right lung [facility])    a.) s/p posterior segmentectomy on 03/20/23 at [facility]  . Anemia   . Arthritis   . Asthma   . Carotid artery stenosis    a.) doppler 05/14/2017: 1-39% BICA; b.) doppler 04/05/2018: 50-69% BICA; c.) doppler 06/12/2019: 40-59% BICA; d.) doppler 06/14/2020, 06/14/2021: 1-39% BICA; e.) doppler 06/19/2022: 1.39% RICA, 40-59% LICA  . Colon polyp   . COPD (chronic obstructive pulmonary disease) (HCC)   . Coronary artery disease 04/03/2006   a.) LHC 04/03/2006: 25% mLM, 25% mLAD, 50% D1, 25/50% mLCx, 50% dLCx, 25% OM2, 100% pRCA --> med mgmt; b.) LHC 04/25/2010: 25% pLM, 25% pLAD, 50% pLCx, 70% dLCx,  100% pRCA --> med mgmt  . Depression   . Diastolic dysfunction    a.) TTE 04/05/2018: EF 65%, no RWMAs, G1DD, triv MR/TR; b.) TTE 10/15/2022: EF 88%, mod LVH, G1DD, triv TR, mild MR; c.) TTE 02/27/2023: EF 65%, no RWMAs, triv TR/MR, RVSP 35; d.) TTE 03/21/2023: EF 65%, no RWMAs, norm RVSF, PASP 44  . Environmental allergies   . Headache   . Hyperlipidemia   . Hypertension   . Hypoxic respiratory failure (HCC)   . Internal hemorrhoids   . 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[facility])    a.) thinks it was in 2007 --> LHC 04/03/2006 revealed a 100% pRCA --> medically managed  . Necrotizing pneumonia (HCC)   . Pulmonary HTN (HCC)    a.) TTE 02/27/2023: RVSP 35 mmHg; b.) TTE 03/21/2023: PASP 44 mmHg  . PVD (peripheral vascular disease) with claudication (HCC)   . Shortness of breath dyspnea   . 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 Substance abuse (HCC)   . Substance induced mood disorder (HCC)   . Suicidal ideation   . Thrombocytopenia (HCC)   . Tobacco abuse   . Unstable angina (HCC)   . Ventral hernia   . Vitamin D  deficiency     Social History   Socioeconomic History  . Marital status: Single    Spouse name: Not on file  . Number of children: Not on  file  . Years of education: Not on file  . Highest education level: Not on file  Occupational History  . Not on file  Tobacco Use  . Smoking status: Every Day    Current packs/day: 1.00    Types: Cigarettes    Passive exposure: Past  . Smokeless tobacco: Never  Vaping Use  . Vaping status: Never Used  Substance and Sexual Activity  . Alcohol use: Not Currently    Comment: weekends, couple beers, pint of liquer only on weekends.  Last drank approx 1 month ago.  . Drug use: No  . Sexual activity: Not Currently  Other Topics Concern  . Not on file  Social History Narrative   Lives with family    Social Drivers of Health   Financial Resource Strain: Low Risk  (03/21/2023)   Received from [facility]   Overall  Financial Resource Strain (CARDIA)   . Difficulty of Paying Living Expenses: Not hard at all  Food Insecurity: No Food Insecurity (04/13/2024)   Hunger Vital Sign   . Worried About Programme researcher, broadcasting/film/video in the Last Year: Never true   . Ran Out of Food in the Last Year: Never true  Transportation Needs: No Transportation Needs (04/13/2024)   PRAPARE - Transportation   . Lack of Transportation (Medical): No   . Lack of Transportation (Non-Medical): No  Physical Activity: Inactive (04/04/2018)   Exercise Vital Sign   . Days of Exercise per Week: 0 days   . Minutes of Exercise per Session: 0 min  Stress: No Stress Concern Present (04/04/2018)   [employer] of Occupational Health - Occupational Stress Questionnaire   . Feeling of Stress : Only a little  Social Connections: Socially Isolated (04/06/2024)   Social Connection and Isolation Panel   . Frequency of Communication with Friends and Family: Once a week   . Frequency of Social Gatherings with Friends and Family: Once a week   . Attends Religious Services: 1 to 4 times per year   . Active Member of Clubs or Organizations: No   . Attends Banker Meetings: Never   . Marital Status: Never married  Intimate Partner Violence: Not At Risk (04/13/2024)   Humiliation, Afraid, Rape, and Kick questionnaire   . Fear of Current or Ex-Partner: No   . Emotionally Abused: No   . Physically Abused: No   . Sexually Abused: No    Past Surgical History:  Procedure Laterality Date  . ABDOMINAL HYSTERECTOMY    . APPENDECTOMY    . CARDIAC CATHETERIZATION    . CARDIAC SURGERY    . CHOLECYSTECTOMY    . COLONOSCOPY  12/15/2011   OH->bleeding internal hemorrhoids, otherwise normal  . COLONOSCOPY WITH PROPOFOL  N/A 04/12/2015   Procedure: COLONOSCOPY WITH PROPOFOL ;  Surgeon: Rogelia Copping, MD;  Location: [facility];  Service: Endoscopy;  Laterality: N/A;  . COLONOSCOPY WITH PROPOFOL  N/A 01/05/2020   Procedure: COLONOSCOPY WITH PROPOFOL ;   Surgeon: Copping Rogelia, MD;  Location: [facility];  Service: Endoscopy;  Laterality: N/A;  . ENDARTERECTOMY FEMORAL Left 04/26/2015   Procedure: ENDARTERECTOMY FEMORAL;  Surgeon: Selinda GORMAN Gu, MD;  Location: [facility];  Service: Vascular;  Laterality: Left;  . ENDOVASCULAR STENT INSERTION Bilateral    Legs  . FLEXIBLE BRONCHOSCOPY N/A 02/02/2015   Procedure: FLEXIBLE BRONCHOSCOPY;  Surgeon: Elfreda DELENA Bathe, MD;  Location: [facility] OR[facility];  Service: Pulmonary;  Laterality: N/A;  . HEMORRHOID SURGERY    . lung mass removed    .  PERIPHERAL VASCULAR CATHETERIZATION Left 04/25/2015   Procedure: Lower Extremity Angiography;  Surgeon: Selinda GORMAN Gu, MD;  Location: [facility];  Service: Cardiovascular;  Laterality: Left;  . PERIPHERAL VASCULAR CATHETERIZATION Left 04/25/2015   Procedure: Lower Extremity Intervention;  Surgeon: Selinda GORMAN Gu, MD;  Location: [facility];  Service: Cardiovascular;  Laterality: Left;  . PERIPHERAL VASCULAR CATHETERIZATION Left 01/02/2016   Procedure: Lower Extremity Angiography;  Surgeon: Selinda GORMAN Gu, MD;  Location: [facility];  Service: Cardiovascular;  Laterality: Left;  . PERIPHERAL VASCULAR CATHETERIZATION  01/02/2016   Procedure: Lower Extremity Intervention;  Surgeon: Selinda GORMAN Gu, MD;  Location: [facility];  Service: Cardiovascular;;  . THROMBECTOMY FEMORAL ARTERY Left 04/26/2015   Procedure: THROMBECTOMY FEMORAL ARTERY;  Surgeon: Selinda GORMAN Gu, MD;  Location: [facility];  Service: Vascular;  Laterality: Left;  SABRA VENTRAL HERNIA REPAIR N/A 07/25/2023   Procedure: HERNIA REPAIR VENTRAL ADULT, open;  Surgeon: Desiderio Schanz, MD;  Location: [facility];  Service: General;  Laterality: N/A;  . VISCERAL ANGIOGRAPHY N/A 04/06/2024   Procedure: VISCERAL ANGIOGRAPHY;  Surgeon: Gu Selinda GORMAN, MD;  Location: [facility];  Service: Cardiovascular;  Laterality: N/A;    Family History  Problem Relation Age of Onset  . Hypertension Mother   .  Hypertension Father     Allergies  Allergen Reactions  . Tylenol  [Acetaminophen ] Anaphylaxis  . Aspirin  Nausea And Vomiting and Other (See Comments)    Pt states aspirin  makes her cramp and have to use coated kind.  . Zyrtec [Cetirizine] Other (See Comments)    Upset stomach  . Tramadol Rash       Latest Ref Rng & Units 04/24/2024    1:30 PM 04/09/2024    4:35 AM 04/08/2024    4:27 AM  CBC  WBC 3.4 - 10.8 x10E3/uL 5.2  4.6  4.6   Hemoglobin 11.1 - 15.9 g/dL 89.0  9.2  8.7   Hematocrit 34.0 - 46.6 % 34.1  27.4  25.5   Platelets 150 - 450 x10E3/uL 318  226  201       CMP     Component Value Date/Time   NA 139 04/24/2024 1330   NA 131 (L) 10/15/2013 1123   K 4.4 04/24/2024 1330   K 3.6 10/15/2013 1123   CL 103 04/24/2024 1330   CL 102 10/15/2013 1123   CO2 20 04/24/2024 1330   CO2 24 10/15/2013 1123   GLUCOSE 93 04/24/2024 1330   GLUCOSE 112 (H) 04/10/2024 0435   GLUCOSE 111 (H) 10/15/2013 1123   BUN 9 04/24/2024 1330   BUN 12 10/15/2013 1123   CREATININE 0.79 04/24/2024 1330   CREATININE 1.20 10/15/2013 1123   CALCIUM  10.7 (H) 04/24/2024 1330   CALCIUM  9.1 10/15/2013 1123   PROT 7.4 04/24/2024 1330   ALBUMIN 4.4 04/24/2024 1330   AST 16 04/24/2024 1330   ALT 11 04/24/2024 1330   ALKPHOS 80 04/24/2024 1330   BILITOT 0.3 04/24/2024 1330   EGFR 81 04/24/2024 1330   GFRNONAA >60 04/10/2024 0435   GFRNONAA 50 (L) 10/15/2013 1123     No results found.     Assessment & Plan:   1. Mesenteric ischemia (HCC) (Primary) ***  2. Essential hypertension ***  3. Mixed hyperlipidemia ***  4. Tobacco use disorder ***   Current Outpatient Medications on File Prior to Visit  Medication Sig Dispense Refill  . albuterol  (VENTOLIN  HFA) 108 (90 Base) MCG/ACT inhaler Inhale 2  puffs into the lungs every 4 (four) hours as needed for wheezing or shortness of breath. 9 g 1  . alum & mag hydroxide-simeth (MAALOX/MYLANTA) 200-200-20 MG/5ML suspension Take 30 mLs by mouth  every 6 (six) hours as needed for indigestion or heartburn. 355 mL 0  . aspirin  EC 81 MG EC tablet Take 1 tablet (81 mg total) by mouth daily. 90 tablet 1  . atorvastatin  (LIPITOR ) 80 MG tablet Take 1 tablet (80 mg total) by mouth daily. 90 tablet 3  . budesonide -formoterol  (SYMBICORT ) 80-4.5 MCG/ACT inhaler INHALE 2 PUFFS ONCE DAILY 22 g 2  . Cholecalciferol  (VITAMIN D -3 PO) Take 1 tablet by mouth daily.    . cyclobenzaprine  (FLEXERIL ) 5 MG tablet Take 1 tablet (5 mg total) by mouth at bedtime as needed for muscle spasms. 30 tablet 3  . ezetimibe  (ZETIA ) 10 MG tablet Take 1 tablet (10 mg total) by mouth daily. 90 tablet 3  . ferrous sulfate  325 (65 FE) MG tablet Take 325 mg by mouth daily.    . gabapentin  (NEURONTIN ) 100 MG capsule Take 1 capsule (100 mg total) by mouth 3 (three) times daily. 90 capsule 0  . losartan  (COZAAR ) 50 MG tablet Take 2 tablets (100 mg total) by mouth daily. 180 tablet 3  . metoprolol  succinate (TOPROL -XL) 50 MG 24 hr tablet Take 1 tablet (50 mg total) by mouth daily. Take with or immediately following a meal. 90 tablet 0  . ondansetron  (ZOFRAN -ODT) 4 MG disintegrating tablet Take 1 tablet (4 mg total) by mouth every 8 (eight) hours as needed for nausea or vomiting. 12 tablet 0  . oxyCODONE  (ROXICODONE ) 5 MG immediate release tablet Take 1 tablet (5 mg total) by mouth daily as needed for severe pain (pain score 7-10). 30 tablet 0  . thiamine  (VITAMIN B-1) 100 MG tablet Take 1 tablet (100 mg total) by mouth daily. 15 tablet 0   No current facility-administered medications on file prior to visit.    There are no Patient Instructions on file for this visit. No follow-ups on file.   Wesson Stith E Caellum Mancil, NP

## 2024-05-16 NOTE — Progress Notes (Unsigned)
 Subjective:    Patient ID: [name], female    DOB: 06/14/1956, 68 y.o.   MRN: [id number] Chief Complaint  Patient presents with  . Follow-up    Mesenteric u/s follow up    HPI  Review of Systems     Objective:   Physical Exam  BP (!) 141/84   Pulse 74   Resp 16   Wt 97 lb 6.4 oz (44.2 kg)   LMP  (LMP Unknown)   BMI 16.72 kg/m   Past Medical History:  Diagnosis Date  . Adenocarcinoma of upper lobe of right lung [facility])    a.) s/p posterior segmentectomy on 03/20/23 at [facility]  . Anemia   . Arthritis   . Asthma   . Carotid artery stenosis    a.) doppler 05/14/2017: 1-39% BICA; b.) doppler 04/05/2018: 50-69% BICA; c.) doppler 06/12/2019: 40-59% BICA; d.) doppler 06/14/2020, 06/14/2021: 1-39% BICA; e.) doppler 06/19/2022: 1.39% RICA, 40-59% LICA  . Colon polyp   . COPD (chronic obstructive pulmonary disease) (HCC)   . Coronary artery disease 04/03/2006   a.) LHC 04/03/2006: 25% mLM, 25% mLAD, 50% D1, 25/50% mLCx, 50% dLCx, 25% OM2, 100% pRCA --> med mgmt; b.) LHC 04/25/2010: 25% pLM, 25% pLAD, 50% pLCx, 70% dLCx, 100% pRCA --> med mgmt  . Depression   . Diastolic dysfunction    a.) TTE 04/05/2018: EF 65%, no RWMAs, G1DD, triv MR/TR; b.) TTE 10/15/2022: EF 88%, mod LVH, G1DD, triv TR, mild MR; c.) TTE 02/27/2023: EF 65%, no RWMAs, triv TR/MR, RVSP 35; d.) TTE 03/21/2023: EF 65%, no RWMAs, norm RVSF, PASP 44  . Environmental allergies   . Headache   . Hyperlipidemia   . Hypertension   . Hypoxic respiratory failure (HCC)   . Internal hemorrhoids   . 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[facility])    a.) thinks it was in 2007 --> LHC 04/03/2006 revealed a 100% pRCA --> medically managed  . Necrotizing pneumonia (HCC)   . Pulmonary HTN (HCC)    a.) TTE 02/27/2023: RVSP 35 mmHg; b.) TTE 03/21/2023: PASP 44 mmHg  . PVD (peripheral vascular disease) with claudication (HCC)   . Shortness of breath dyspnea   . 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 Substance abuse (HCC)   .  Substance induced mood disorder (HCC)   . Suicidal ideation   . Thrombocytopenia (HCC)   . Tobacco abuse   . Unstable angina (HCC)   . Ventral hernia   . Vitamin D  deficiency     Social History   Socioeconomic History  . Marital status: Single    Spouse name: Not on file  . Number of children: Not on file  . Years of education: Not on file  . Highest education level: Not on file  Occupational History  . Not on file  Tobacco Use  . Smoking status: Every Day    Current packs/day: 1.00    Types: Cigarettes    Passive exposure: Past  . Smokeless tobacco: Never  Vaping Use  . Vaping status: Never Used  Substance and Sexual Activity  . Alcohol use: Not Currently    Comment: weekends, couple beers, pint of liquer only on weekends.  Last drank approx 1 month ago.  . Drug use: No  . Sexual activity: Not Currently  Other Topics Concern  . Not on file  Social History Narrative   Lives with family    Social Drivers of Health   Financial Resource Strain: Low Risk  (03/21/2023)  Received from [facility]   Overall Financial Resource Strain (CARDIA)   . Difficulty of Paying Living Expenses: Not hard at all  Food Insecurity: No Food Insecurity (04/13/2024)   Hunger Vital Sign   . Worried About Programme researcher, broadcasting/film/video in the Last Year: Never true   . Ran Out of Food in the Last Year: Never true  Transportation Needs: No Transportation Needs (04/13/2024)   PRAPARE - Transportation   . Lack of Transportation (Medical): No   . Lack of Transportation (Non-Medical): No  Physical Activity: Inactive (04/04/2018)   Exercise Vital Sign   . Days of Exercise per Week: 0 days   . Minutes of Exercise per Session: 0 min  Stress: No Stress Concern Present (04/04/2018)   [employer] of Occupational Health - Occupational Stress Questionnaire   . Feeling of Stress : Only a little  Social Connections: Socially Isolated (04/06/2024)   Social Connection and Isolation Panel   . Frequency of  Communication with Friends and Family: Once a week   . Frequency of Social Gatherings with Friends and Family: Once a week   . Attends Religious Services: 1 to 4 times per year   . Active Member of Clubs or Organizations: No   . Attends Banker Meetings: Never   . Marital Status: Never married  Intimate Partner Violence: Not At Risk (04/13/2024)   Humiliation, Afraid, Rape, and Kick questionnaire   . Fear of Current or Ex-Partner: No   . Emotionally Abused: No   . Physically Abused: No   . Sexually Abused: No    Past Surgical History:  Procedure Laterality Date  . ABDOMINAL HYSTERECTOMY    . APPENDECTOMY    . CARDIAC CATHETERIZATION    . CARDIAC SURGERY    . CHOLECYSTECTOMY    . COLONOSCOPY  12/15/2011   OH->bleeding internal hemorrhoids, otherwise normal  . COLONOSCOPY WITH PROPOFOL  N/A 04/12/2015   Procedure: COLONOSCOPY WITH PROPOFOL ;  Surgeon: Rogelia Copping, MD;  Location: [facility];  Service: Endoscopy;  Laterality: N/A;  . COLONOSCOPY WITH PROPOFOL  N/A 01/05/2020   Procedure: COLONOSCOPY WITH PROPOFOL ;  Surgeon: Copping Rogelia, MD;  Location: [facility] END[facility]COPY;  Service: Endoscopy;  Laterality: N/A;  . ENDARTERECTOMY FEMORAL Left 04/26/2015   Procedure: ENDARTERECTOMY FEMORAL;  Surgeon: Selinda GORMAN Gu, MD;  Location: [facility] OR[facility];  Service: Vascular;  Laterality: Left;  . ENDOVASCULAR STENT INSERTION Bilateral    Legs  . FLEXIBLE BRONCHOSCOPY N/A 02/02/2015   Procedure: FLEXIBLE BRONCHOSCOPY;  Surgeon: Elfreda DELENA Bathe, MD;  Location: [facility];  Service: Pulmonary;  Laterality: N/A;  . HEMORRHOID SURGERY    . lung mass removed    . PERIPHERAL VASCULAR CATHETERIZATION Left 04/25/2015   Procedure: Lower Extremity Angiography;  Surgeon: Selinda GORMAN Gu, MD;  Location: [facility];  Service: Cardiovascular;  Laterality: Left;  . PERIPHERAL VASCULAR CATHETERIZATION Left 04/25/2015   Procedure: Lower Extremity Intervention;  Surgeon: Selinda GORMAN Gu, MD;  Location: [facility]  INVASIVE CV LAB;  Service: Cardiovascular;  Laterality: Left;  . PERIPHERAL VASCULAR CATHETERIZATION Left 01/02/2016   Procedure: Lower Extremity Angiography;  Surgeon: Selinda GORMAN Gu, MD;  Location: [facility];  Service: Cardiovascular;  Laterality: Left;  . PERIPHERAL VASCULAR CATHETERIZATION  01/02/2016   Procedure: Lower Extremity Intervention;  Surgeon: Selinda GORMAN Gu, MD;  Location: [facility];  Service: Cardiovascular;;  . THROMBECTOMY FEMORAL ARTERY Left 04/26/2015   Procedure: THROMBECTOMY FEMORAL ARTERY;  Surgeon: Selinda GORMAN Gu, MD;  Location: [facility];  Service: Vascular;  Laterality: Left;  SABRA VENTRAL HERNIA REPAIR N/A 07/25/2023   Procedure: HERNIA REPAIR VENTRAL ADULT, open;  Surgeon: Desiderio Schanz, MD;  Location: [facility];  Service: General;  Laterality: N/A;  . VISCERAL ANGIOGRAPHY N/A 04/06/2024   Procedure: VISCERAL ANGIOGRAPHY;  Surgeon: Marea Selinda RAMAN, MD;  Location: [facility];  Service: Cardiovascular;  Laterality: N/A;    Family History  Problem Relation Age of Onset  . Hypertension Mother   . Hypertension Father     Allergies  Allergen Reactions  . Tylenol  [Acetaminophen ] Anaphylaxis  . Aspirin  Nausea And Vomiting and Other (See Comments)    Pt states aspirin  makes her cramp and have to use coated kind.  . Zyrtec [Cetirizine] Other (See Comments)    Upset stomach  . Tramadol Rash       Latest Ref Rng & Units 04/24/2024    1:30 PM 04/09/2024    4:35 AM 04/08/2024    4:27 AM  CBC  WBC 3.4 - 10.8 x10E3/uL 5.2  4.6  4.6   Hemoglobin 11.1 - 15.9 g/dL 89.0  9.2  8.7   Hematocrit 34.0 - 46.6 % 34.1  27.4  25.5   Platelets 150 - 450 x10E3/uL 318  226  201       CMP     Component Value Date/Time   NA 139 04/24/2024 1330   NA 131 (L) 10/15/2013 1123   K 4.4 04/24/2024 1330   K 3.6 10/15/2013 1123   CL 103 04/24/2024 1330   CL 102 10/15/2013 1123   CO2 20 04/24/2024 1330   CO2 24 10/15/2013 1123   GLUCOSE 93 04/24/2024 1330   GLUCOSE 112  (H) 04/10/2024 0435   GLUCOSE 111 (H) 10/15/2013 1123   BUN 9 04/24/2024 1330   BUN 12 10/15/2013 1123   CREATININE 0.79 04/24/2024 1330   CREATININE 1.20 10/15/2013 1123   CALCIUM  10.7 (H) 04/24/2024 1330   CALCIUM  9.1 10/15/2013 1123   PROT 7.4 04/24/2024 1330   ALBUMIN 4.4 04/24/2024 1330   AST 16 04/24/2024 1330   ALT 11 04/24/2024 1330   ALKPHOS 80 04/24/2024 1330   BILITOT 0.3 04/24/2024 1330   EGFR 81 04/24/2024 1330   GFRNONAA >60 04/10/2024 0435   GFRNONAA 50 (L) 10/15/2013 1123     No results found.     Assessment & Plan:   1. Mesenteric ischemia (HCC) (Primary) ***  2. Essential hypertension ***  3. Mixed hyperlipidemia ***  4. Tobacco use disorder ***   Current Outpatient Medications on File Prior to Visit  Medication Sig Dispense Refill  . albuterol  (VENTOLIN  HFA) 108 (90 Base) MCG/ACT inhaler Inhale 2 puffs into the lungs every 4 (four) hours as needed for wheezing or shortness of breath. 9 g 1  . alum & mag hydroxide-simeth (MAALOX/MYLANTA) 200-200-20 MG/5ML suspension Take 30 mLs by mouth every 6 (six) hours as needed for indigestion or heartburn. 355 mL 0  . aspirin  EC 81 MG EC tablet Take 1 tablet (81 mg total) by mouth daily. 90 tablet 1  . atorvastatin  (LIPITOR ) 80 MG tablet Take 1 tablet (80 mg total) by mouth daily. 90 tablet 3  . budesonide -formoterol  (SYMBICORT ) 80-4.5 MCG/ACT inhaler INHALE 2 PUFFS ONCE DAILY 22 g 2  . Cholecalciferol  (VITAMIN D -3 PO) Take 1 tablet by mouth daily.    . cyclobenzaprine  (FLEXERIL ) 5 MG tablet Take 1 tablet (5 mg total) by mouth at bedtime as needed for muscle spasms. 30 tablet 3  . ezetimibe  (  ZETIA ) 10 MG tablet Take 1 tablet (10 mg total) by mouth daily. 90 tablet 3  . ferrous sulfate  325 (65 FE) MG tablet Take 325 mg by mouth daily.    . gabapentin  (NEURONTIN ) 100 MG capsule Take 1 capsule (100 mg total) by mouth 3 (three) times daily. 90 capsule 0  . losartan  (COZAAR ) 50 MG tablet Take 2 tablets (100 mg  total) by mouth daily. 180 tablet 3  . metoprolol  succinate (TOPROL -XL) 50 MG 24 hr tablet Take 1 tablet (50 mg total) by mouth daily. Take with or immediately following a meal. 90 tablet 0  . ondansetron  (ZOFRAN -ODT) 4 MG disintegrating tablet Take 1 tablet (4 mg total) by mouth every 8 (eight) hours as needed for nausea or vomiting. 12 tablet 0  . oxyCODONE  (ROXICODONE ) 5 MG immediate release tablet Take 1 tablet (5 mg total) by mouth daily as needed for severe pain (pain score 7-10). 30 tablet 0  . thiamine  (VITAMIN B-1) 100 MG tablet Take 1 tablet (100 mg total) by mouth daily. 15 tablet 0   No current facility-administered medications on file prior to visit.    There are no Patient Instructions on file for this visit. No follow-ups on file.   Rachele Lamaster E Harleigh Civello, NP

## 2024-05-18 ENCOUNTER — Telehealth: Payer: Self-pay

## 2024-05-18 ENCOUNTER — Telehealth (INDEPENDENT_AMBULATORY_CARE_PROVIDER_SITE_OTHER): Payer: Self-pay | Admitting: Nurse Practitioner

## 2024-05-18 ENCOUNTER — Other Ambulatory Visit (INDEPENDENT_AMBULATORY_CARE_PROVIDER_SITE_OTHER): Payer: Self-pay | Admitting: Nurse Practitioner

## 2024-05-18 DIAGNOSIS — K551 Chronic vascular disorders of intestine: Secondary | ICD-10-CM

## 2024-05-18 NOTE — Telephone Encounter (Signed)
 Spoken to patient and notified Fallon's comments. Verbalized understanding.

## 2024-05-18 NOTE — Telephone Encounter (Signed)
 She can discuss with her PCP, it looks like they have been sending her pain medication in

## 2024-05-18 NOTE — Telephone Encounter (Signed)
 Patient left a message stating she is having really bad  stomach pain. She is requesting for provider to send percocet to [facility] pharmacy.  Please advise. Patient was last seen this past Friday, 05/15/2024.

## 2024-05-19 ENCOUNTER — Telehealth (INDEPENDENT_AMBULATORY_CARE_PROVIDER_SITE_OTHER): Payer: Self-pay

## 2024-05-19 NOTE — Telephone Encounter (Signed)
 Spoke with the patient and she is scheduled with Dr. Marea for a mesenteric angio on 05/21/24 with a 10:00 am arrival time to the [facility]. Pre-procedure instructions were discussed and patient stated she wrote them down as her Mychart doesn't work.

## 2024-05-20 MED ORDER — OXYCODONE HCL 5 MG PO TABS
5.0000 mg | ORAL_TABLET | Freq: Every day | ORAL | 0 refills | Status: DC | PRN
Start: 2024-05-20 — End: 2024-07-21

## 2024-05-20 NOTE — Telephone Encounter (Signed)
Pt notified that we sent med

## 2024-05-21 ENCOUNTER — Ambulatory Visit
Admission: RE | Admit: 2024-05-21 | Discharge: 2024-05-21 | Disposition: A | Discharge status: Home / Self Care | Attending: Vascular Surgery | Admitting: Vascular Surgery

## 2024-05-21 ENCOUNTER — Other Ambulatory Visit: Payer: Self-pay

## 2024-05-21 ENCOUNTER — Encounter: Payer: Self-pay | Admitting: Vascular Surgery

## 2024-05-21 ENCOUNTER — Encounter
Admission: RE | Disposition: A | Discharge status: Home / Self Care | Payer: Self-pay | Source: Home / Self Care | Attending: Vascular Surgery

## 2024-05-21 DIAGNOSIS — E782 Mixed hyperlipidemia: Secondary | ICD-10-CM | POA: Diagnosis not present

## 2024-05-21 DIAGNOSIS — Z79899 Other long term (current) drug therapy: Secondary | ICD-10-CM | POA: Insufficient documentation

## 2024-05-21 DIAGNOSIS — F17210 Nicotine dependence, cigarettes, uncomplicated: Secondary | ICD-10-CM | POA: Insufficient documentation

## 2024-05-21 DIAGNOSIS — I10 Essential (primary) hypertension: Secondary | ICD-10-CM | POA: Diagnosis not present

## 2024-05-21 DIAGNOSIS — K551 Chronic vascular disorders of intestine: Secondary | ICD-10-CM | POA: Diagnosis not present

## 2024-05-21 DIAGNOSIS — I708 Atherosclerosis of other arteries: Secondary | ICD-10-CM

## 2024-05-21 DIAGNOSIS — K559 Vascular disorder of intestine, unspecified: Secondary | ICD-10-CM

## 2024-05-21 DIAGNOSIS — Z95828 Presence of other vascular implants and grafts: Secondary | ICD-10-CM | POA: Diagnosis not present

## 2024-05-21 DIAGNOSIS — Z604 Social exclusion and rejection: Secondary | ICD-10-CM | POA: Insufficient documentation

## 2024-05-21 DIAGNOSIS — Z9889 Other specified postprocedural states: Secondary | ICD-10-CM | POA: Diagnosis not present

## 2024-05-21 DIAGNOSIS — T82858A Stenosis of vascular prosthetic devices, implants and grafts, initial encounter: Secondary | ICD-10-CM | POA: Diagnosis not present

## 2024-05-21 DIAGNOSIS — I771 Stricture of artery: Secondary | ICD-10-CM | POA: Insufficient documentation

## 2024-05-21 HISTORY — PX: VISCERAL ANGIOGRAPHY: CATH118276

## 2024-05-21 LAB — CREATININE, SERUM
Creatinine, Ser: 0.77 mg/dL (ref 0.44–1.00)
GFR, Estimated: >60 mL/min (ref >60)

## 2024-05-21 LAB — BUN: BUN: 11 mg/dL (ref 8–23)

## 2024-05-21 SURGERY — VISCERAL ANGIOGRAPHY
Anesthesia: Moderate Sedation

## 2024-05-21 MED ORDER — MIDAZOLAM HCL 5 MG/5ML IJ SOLN
INTRAMUSCULAR | Status: AC
  Filled 2024-05-21: qty 5

## 2024-05-21 MED ORDER — HEPARIN (PORCINE) IN NACL 2000-0.9 UNIT/L-% IV SOLN
INTRAVENOUS | Status: DC | PRN
  Administered 2024-05-21: 1000 mL

## 2024-05-21 MED ORDER — FENTANYL CITRATE (PF) 100 MCG/2ML IJ SOLN
INTRAMUSCULAR | Status: AC
  Filled 2024-05-21: qty 2

## 2024-05-21 MED ORDER — DIPHENHYDRAMINE HCL 50 MG/ML IJ SOLN: 50.0000 mg | Freq: Once | INTRAMUSCULAR | Status: DC | PRN

## 2024-05-21 MED ORDER — LIDOCAINE-EPINEPHRINE (PF) 1 %-1:200000 IJ SOLN
INTRAMUSCULAR | Status: DC | PRN
  Administered 2024-05-21: 10 mL

## 2024-05-21 MED ORDER — ONDANSETRON HCL 4 MG/2ML IJ SOLN: 4.0000 mg | Freq: Four times a day (QID) | INTRAMUSCULAR | Status: DC | PRN

## 2024-05-21 MED ORDER — METHYLPREDNISOLONE SODIUM SUCC 125 MG IJ SOLR: 125.0000 mg | Freq: Once | INTRAMUSCULAR | Status: DC | PRN

## 2024-05-21 MED ORDER — HYDROMORPHONE HCL 1 MG/ML IJ SOLN: 1.0000 mg | Freq: Once | INTRAMUSCULAR | Status: DC | PRN

## 2024-05-21 MED ORDER — HEPARIN SODIUM (PORCINE) 1000 UNIT/ML IJ SOLN
INTRAMUSCULAR | Status: AC
  Filled 2024-05-21: qty 10

## 2024-05-21 MED ORDER — CEFAZOLIN SODIUM-DEXTROSE 2-4 GM/100ML-% IV SOLN
2.0000 g | INTRAVENOUS | Status: AC
  Administered 2024-05-21: 2 g via INTRAVENOUS

## 2024-05-21 MED ORDER — MIDAZOLAM HCL 2 MG/ML PO SYRP: 8.0000 mg | ORAL_SOLUTION | Freq: Once | ORAL | Status: DC | PRN

## 2024-05-21 MED ORDER — FAMOTIDINE 20 MG PO TABS: 40.0000 mg | ORAL_TABLET | Freq: Once | ORAL | Status: DC | PRN

## 2024-05-21 MED ORDER — CEFAZOLIN SODIUM-DEXTROSE 2-4 GM/100ML-% IV SOLN
INTRAVENOUS | Status: AC
  Filled 2024-05-21: qty 100

## 2024-05-21 MED ORDER — SODIUM CHLORIDE 0.9 % IV SOLN: INTRAVENOUS | Status: DC

## 2024-05-21 MED ORDER — IODIXANOL 320 MG/ML IV SOLN
INTRAVENOUS | Status: DC | PRN
  Administered 2024-05-21: 35 mL via INTRA_ARTERIAL

## 2024-05-21 MED ORDER — FENTANYL CITRATE (PF) 100 MCG/2ML IJ SOLN
INTRAMUSCULAR | Status: DC | PRN
  Administered 2024-05-21: 50 ug via INTRAVENOUS

## 2024-05-21 MED ORDER — MIDAZOLAM HCL 2 MG/2ML IJ SOLN
INTRAMUSCULAR | Status: DC | PRN
  Administered 2024-05-21: 1 mg via INTRAVENOUS

## 2024-05-21 SURGICAL SUPPLY — 15 items
BALLOON LUTONIX DCB 6X40X130 (BALLOONS) IMPLANT
BALLOON LUTONIX DCB 7X40X130 (BALLOONS) IMPLANT
CATH ANGIO 5F PIGTAIL 65CM (CATHETERS) IMPLANT
CATH BEACON 5 .035 65 C2 TIP (CATHETERS) IMPLANT
CATH VS15FR (CATHETERS) IMPLANT
COVER PROBE ULTRASOUND 5X96 (MISCELLANEOUS) IMPLANT
DEVICE PRESTO INFLATION (MISCELLANEOUS) IMPLANT
DEVICE STARCLOSE SE CLOSURE (Vascular Products) IMPLANT
GLIDEWIRE STIFF .35X180X3 HYDR (WIRE) IMPLANT
PACK ANGIOGRAPHY (CUSTOM PROCEDURE TRAY) ×1 IMPLANT
SHEATH BRITE TIP 5FRX11 (SHEATH) IMPLANT
SYR MEDRAD MARK 7 150ML (SYRINGE) IMPLANT
TUBING CONTRAST HIGH PRESS 72 (TUBING) IMPLANT
WIRE J 3MM .035X145CM (WIRE) IMPLANT
WIRE SUPRACORE 300CM (WIRE) IMPLANT

## 2024-05-21 NOTE — Op Note (Signed)
 [facility]  Percutaneous Study/Intervention Procedural Note   Date: 05/21/2024  Surgeon(s): Selinda Gu, MD  Assistants: none  Pre-operative Diagnosis: 1.  Chronic mesenteric ischemia 2.  SMA occlusion, celiac artery stenosis   Post-operative diagnosis:  Same  Procedure(s) Performed:             1.  Ultrasound guidance for vascular access right femoral artery             2.  Catheter placement into celiac artery from right femoral approach             3.  Aortogram and selective angiogram of the celiac artery             4.  PTA of the celiac artery stent with 6 and 7 mm diameter Lutonix drug-coated angioplasty balloon             5.  StarClose closure device right femoral artery  Contrast: 35 cc  Fluoro time: 4.8 minutes  EBL: 5 cc  Anesthesia: Approximately 26 minutes of Moderate conscious sedation using 1 mg of Versed  and 50 mcg of Fentanyl               Indications:  Patient is a 68 y.o. female who has symptoms consistent with mesenteric ischemia.  She has previously had stent placement for celiac artery stenosis with a known SMA occlusion.  The patient has a duplex showing elevated velocities within the celiac stent consistent with a greater than 70% stenosis. The patient is brought in for angiography for further evaluation and potential treatment. Risks and benefits are discussed and informed consent is obtained  Procedure:  The patient was identified and appropriate procedural time out was performed.  The patient was then placed supine on the table and prepped and draped in the usual sterile fashion. Moderate conscious sedation was administered during a face to face encounter with the patient throughout the procedure with my supervision of the RN administering medicines and monitoring the patient's vital signs, pulse oximetry, telemetry and mental status throughout from the start of the procedure until the patient was taken to the recovery room.   Ultrasound was used to evaluate the right common femoral artery.  It was patent .  A digital ultrasound image was acquired.  A Seldinger needle was used to access the right common femoral artery under direct ultrasound guidance and a permanent image was performed.  A 0.035 J wire was advanced without resistance and a 5Fr sheath was placed.  Pigtail catheter was placed into the aorta and an AP aortogram was performed. This demonstrated heavily calcific aorta and iliac arteries with some ectasia of the aorta there was also some renal artery disease although not particular well-seen due to patient motion. We transitioned to a steep LAO projection to image the celiac and SMA. The nearly lateral image demonstrated the stented celiac artery that was very difficult to opacify whether or not there was any free of stenosis within this.  The SMA was occluded..  A CT catheter was used to selectively cannulate the celiac artery.  This demonstrated what appeared to be some degree of stenosis at the edge of the previously placed stent although the degree of stenosis of the celiac artery is very difficult to discern due to patient motion and poor image quality. Based on her symptoms and these findings, I elected to treat the celiac artery stent to try to improve the patient's clinical course. I crossed the lesion without difficulty with  supra core wire. I then used a 6 mm diameter x 40 mm length angioplasty balloon to perform percutaneous transluminal angioplasty of the distal portion of the celiac artery stent in the area just beyond the previously placed stent. I inflated the balloon to 12 atm and held the inflation for 1 minute.  I then used a 7 mm diameter by 4 cm length Lutonix drug-coated angioplasty balloon to address the proximal portion of the celiac artery stent back into the aorta.  On completion angiogram following this angioplasty, there appeared to be less than 20% residual stenosis that was identified.  Again image  quality appeared fairly poor.  There was brisk flow through the stent now.  At this point, I elected to terminate the procedure. The diagnostic catheter was removed. StarClose closure device was deployed in usual fashion with excellent hemostatic result. The patient was taken to the recovery room in stable condition having tolerated the procedure well.     Disposition: Patient was taken to the recovery room in stable condition having tolerated the procedure well.  Complications:  None  Selinda Gu 05/21/2024 12:04 PM   This note was created with Dragon Medical transcription system. Any errors in dictation are purely unintentional.

## 2024-05-21 NOTE — Interval H&P Note (Signed)
 History and Physical Interval Note:  05/21/2024 10:09 AM  [name]  has presented today for surgery, with the diagnosis of Mesenteric Angio    Mesenteric Ischemia.  The various methods of treatment have been discussed with the patient and family. After consideration of risks, benefits and other options for treatment, the patient has consented to  Procedure(s): VISCERAL ANGIOGRAPHY (N/A) as a surgical intervention.  The patient's history has been reviewed, patient examined, no change in status, stable for surgery.  I have reviewed the patient's chart and labs.  Questions were answered to the patient's satisfaction.     Falicia Lizotte

## 2024-05-25 ENCOUNTER — Encounter: Payer: Self-pay | Admitting: Vascular Surgery

## 2024-06-04 ENCOUNTER — Other Ambulatory Visit: Payer: Self-pay

## 2024-06-04 DIAGNOSIS — R0602 Shortness of breath: Secondary | ICD-10-CM

## 2024-06-10 ENCOUNTER — Encounter: Payer: Self-pay | Admitting: Nurse Practitioner

## 2024-06-10 DIAGNOSIS — F10280 Alcohol dependence with alcohol-induced anxiety disorder: Secondary | ICD-10-CM | POA: Insufficient documentation

## 2024-06-17 ENCOUNTER — Ambulatory Visit: Payer: 59 | Admitting: Internal Medicine

## 2024-06-17 DIAGNOSIS — R0602 Shortness of breath: Secondary | ICD-10-CM

## 2024-06-22 NOTE — Procedures (Signed)
 [facility] [address] [place], [address]    Complete Pulmonary Function Testing Interpretation:  FINDINGS:  The forced vital capacity is normal.  FEV1 is 1.49 L which is 77% of predicted and is mildly decreased.  FEV1 FVC ratio is moderately decreased.  Postbronchodilator there was no significant change in the FEV1 total lung capacity is normal.  Residual volume is increased.  FRC is increased.  DLCO is severely decreased.  IMPRESSION:  This pulmonary function study is consistent with mild obstructive lung disease clinical correlation recommended.  Elfreda DELENA Bathe, MD [facility]monary Critical [facility] Medicine Sleep Medicine

## 2024-06-23 ENCOUNTER — Other Ambulatory Visit (INDEPENDENT_AMBULATORY_CARE_PROVIDER_SITE_OTHER): Payer: Self-pay | Admitting: Vascular Surgery

## 2024-06-23 DIAGNOSIS — K551 Chronic vascular disorders of intestine: Secondary | ICD-10-CM

## 2024-06-23 DIAGNOSIS — K55069 Acute infarction of intestine, part and extent unspecified: Secondary | ICD-10-CM

## 2024-06-24 ENCOUNTER — Encounter (INDEPENDENT_AMBULATORY_CARE_PROVIDER_SITE_OTHER): Payer: Self-pay | Admitting: Nurse Practitioner

## 2024-06-24 ENCOUNTER — Ambulatory Visit (INDEPENDENT_AMBULATORY_CARE_PROVIDER_SITE_OTHER): Admitting: Nurse Practitioner

## 2024-06-24 ENCOUNTER — Other Ambulatory Visit (INDEPENDENT_AMBULATORY_CARE_PROVIDER_SITE_OTHER)

## 2024-06-24 VITALS — BP 183/95 | HR 83 | Resp 16 | Wt 99.6 lb

## 2024-06-24 DIAGNOSIS — K55069 Acute infarction of intestine, part and extent unspecified: Secondary | ICD-10-CM | POA: Diagnosis not present

## 2024-06-24 DIAGNOSIS — I10 Essential (primary) hypertension: Secondary | ICD-10-CM

## 2024-06-24 DIAGNOSIS — K551 Chronic vascular disorders of intestine: Secondary | ICD-10-CM

## 2024-06-24 DIAGNOSIS — R1033 Periumbilical pain: Secondary | ICD-10-CM

## 2024-06-24 DIAGNOSIS — K559 Vascular disorder of intestine, unspecified: Secondary | ICD-10-CM

## 2024-06-24 LAB — PULMONARY FUNCTION TEST

## 2024-06-24 MED ORDER — CLOPIDOGREL BISULFATE 75 MG PO TABS: 75.0000 mg | ORAL_TABLET | Freq: Every day | ORAL | 11 refills | Status: DC

## 2024-06-24 NOTE — Progress Notes (Signed)
 Subjective:    Patient ID: [name], female    DOB: [birth year], 68 y.o.   MRN: [id number] Chief Complaint  Patient presents with   Follow-up    [facility] 4 week with mesenteric    The patient returns to the office for follow-up regarding chronic mesenteric ischemia.   The patient is status post angiography with intervention of the celiac artery.  She underwent initial intervention on 04/06/2024 but following that intervention she continued to have issues.     Procedure date 04/06/2024:   Procedure(s) Performed:             1.  Ultrasound guidance for vascular access right femoral artery              2.  Catheter placement into SMA and the celiac artery from right femoral approach             3.  Aortogram and selective angiogram of the SMA and the celiac artery             4.  Stent to the celiac artery with 6 mm diameter x 37 mm length balloon expandable stent             5.  StarClose closure device right femoral artery   She had velocities that were significantly elevated, causing concern for restenosis and so the patient underwent angiogram again on 05/21/2024, which included:  Procedure(s) Performed:             1.  Ultrasound guidance for vascular access right femoral artery             2.  Catheter placement into celiac artery from right femoral approach             3.  Aortogram and selective angiogram of the celiac artery             4.  PTA of the celiac artery stent with 6 and 7 mm diameter Lutonix drug-coated angioplasty balloon             5.  StarClose closure device right femoral artery  Intervention was done because the edge of the previously placed stent showed some degree of stenosis although it was hard to ascertain the exact degree due to patient movement.   The patient reports that since the intervention the abdominal pain and postprandial symptoms are no better.  She says she continues to have food phobia and difficulty eating.  She also notes that she has been  abdominal pain with activity.  She also notes that because of this pain she is not taking any of her medication other than her pain medication.  She also notes that she has issues with having a bowel movement.  She only goes once or twice per week and when she does she has diarrhea.   No history of peptic ulcer disease.     The patient denies amaurosis fugax or recent TIA symptoms. There are no recent neurological changes noted. The patient denies claudication symptoms or rest pain symptoms. The patient denies history of DVT, PE or superficial thrombophlebitis. The patient denies recent episodes of angina   She has a known SMA occlusion.  Pulses today show widely patent stent but suggested 70 to 99% stenosis however her velocities are much improved from her previous studies.     Review of Systems  Gastrointestinal:  Positive for abdominal pain, constipation and diarrhea.  All other systems reviewed and are negative.  Objective:   Physical Exam Vitals reviewed.  HENT:     Head: Normocephalic.  Cardiovascular:     Rate and Rhythm: Normal rate.  Pulmonary:     Effort: Pulmonary effort is normal.  Skin:    General: Skin is warm and dry.  Neurological:     Mental Status: She is alert and oriented to person, place, and time.     Motor: Weakness present.  Psychiatric:        Mood and Affect: Mood normal.        Behavior: Behavior normal.        Thought Content: Thought content normal.        Judgment: Judgment normal.     BP (!) 183/95   Pulse 83   Resp 16   Wt 99 lb 9.6 oz (45.2 kg)   LMP  (LMP Unknown)   BMI 17.10 kg/m   Past Medical History:  Diagnosis Date   Adenocarcinoma of upper lobe of right lung (HCC)    a.) s/p posterior segmentectomy on 03/20/23 at [facility]   Anemia    Arthritis    Asthma    Carotid artery stenosis    a.) doppler 05/14/2017: 1-39% BICA; b.) doppler 04/05/2018: 50-69% BICA; c.) doppler 06/12/2019: 40-59% BICA; d.) doppler 06/14/2020,  06/14/2021: 1-39% BICA; e.) doppler 06/19/2022: 1.39% RICA, 40-59% LICA   Colon polyp    COPD (chronic obstructive pulmonary disease) (HCC)    Coronary artery disease 04/03/2006   a.) LHC 04/03/2006: 25% mLM, 25% mLAD, 50% D1, 25/50% mLCx, 50% dLCx, 25% OM2, 100% pRCA --> med mgmt; b.) LHC 04/25/2010: 25% pLM, 25% pLAD, 50% pLCx, 70% dLCx, 100% pRCA --> med mgmt   Depression    Diastolic dysfunction    a.) TTE 04/05/2018: EF 65%, no RWMAs, G1DD, triv MR/TR; b.) TTE 10/15/2022: EF 88%, mod LVH, G1DD, triv TR, mild MR; c.) TTE 02/27/2023: EF 65%, no RWMAs, triv TR/MR, RVSP 35; d.) TTE 03/21/2023: EF 65%, no RWMAs, norm RVSF, PASP 44   Environmental allergies    Headache    Hyperlipidemia    Hypertension    Hypoxic respiratory failure (HCC)    Internal hemorrhoids    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[facility])    a.) thinks it was in 2007 --> LHC 04/03/2006 revealed a 100% pRCA --> medically managed   Necrotizing pneumonia (HCC)    Pulmonary HTN (HCC)    a.) TTE 02/27/2023: RVSP 35 mmHg; b.) TTE 03/21/2023: PASP 44 mmHg   PVD (peripheral vascular disease) with claudication    Shortness of breath dyspnea    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    Substance abuse (HCC)    Substance induced mood disorder (HCC)    Suicidal ideation    Thrombocytopenia    Tobacco abuse    Unstable angina (HCC)    Ventral hernia    Vitamin D  deficiency     Social History   Socioeconomic History   Marital status: Single    Spouse name: Not on file   Number of children: Not on file   Years of education: Not on file   Highest education level: Not on file  Occupational History   Not on file  Tobacco Use   Smoking status: Every Day    Current packs/day: 1.00    Types: Cigarettes    Passive exposure: Past   Smokeless tobacco: Never  Vaping Use   Vaping status: Never Used  Substance and Sexual Activity   Alcohol use: Not Currently  Comment: weekends, couple beers, pint of liquer only on weekends.   Last drank approx 1 month ago.   Drug use: No   Sexual activity: Not Currently  Other Topics Concern   Not on file  Social History Narrative   Lives with family    Social Drivers of Health   Financial Resource Strain: Low Risk  (03/21/2023)   Received [facility]   Overall Financial Resource Strain (CARDIA)    Difficulty of Paying Living Expenses: Not hard at all  Food Insecurity: No Food Insecurity (04/13/2024)   Hunger Vital Sign    Worried About Running Out of Food in the Last Year: Never true    Ran Out of Food in the Last Year: Never true  Transportation Needs: No Transportation Needs (04/13/2024)   PRAPARE - Administrator, Civil Service (Medical): No    Lack of Transportation (Non-Medical): No  Physical Activity: Inactive (04/04/2018)   Exercise Vital Sign    Days of Exercise per Week: 0 days    Minutes of Exercise per Session: 0 min  Stress: No Stress Concern Present (04/04/2018)   [employer] of Occupational Health - Occupational Stress Questionnaire    Feeling of Stress : Only a little  Social Connections: Socially Isolated (04/06/2024)   Social Connection and Isolation Panel    Frequency of Communication with Friends and Family: Once a week    Frequency of Social Gatherings with Friends and Family: Once a week    Attends Religious Services: 1 to 4 times per year    Active Member of [employer] or Organizations: No    Attends Banker Meetings: Never    Marital Status: Never married  Intimate Partner Violence: Not At Risk (04/13/2024)   Humiliation, Afraid, Rape, and Kick questionnaire    Fear of Current or Ex-Partner: No    Emotionally Abused: No    Physically Abused: No    Sexually Abused: No    Past Surgical History:  Procedure Laterality Date   ABDOMINAL HYSTERECTOMY     APPENDECTOMY     CARDIAC CATHETERIZATION     CARDIAC SURGERY     CHOLECYSTECTOMY     COLONOSCOPY  12/15/2011   OH->bleeding internal hemorrhoids, otherwise  normal   COLONOSCOPY WITH PROPOFOL  N/A 04/12/2015   Procedure: COLONOSCOPY WITH PROPOFOL ;  Surgeon: Rogelia Copping, MD;  Location: [facility] ENDOSCOPY;  Service: Endoscopy;  Laterality: N/A;   COLONOSCOPY WITH PROPOFOL  N/A 01/05/2020   Procedure: COLONOSCOPY WITH PROPOFOL ;  Surgeon: Copping Rogelia, MD;  Location: [facility] ENDOSCOPY;  Service: Endoscopy;  Laterality: N/A;   ENDARTERECTOMY FEMORAL Left 04/26/2015   Procedure: ENDARTERECTOMY FEMORAL;  Surgeon: Selinda GORMAN Gu, MD;  Location: [facility] ORS;  Service: Vascular;  Laterality: Left;   ENDOVASCULAR STENT INSERTION Bilateral    Legs   FLEXIBLE BRONCHOSCOPY N/A 02/02/2015   Procedure: FLEXIBLE BRONCHOSCOPY;  Surgeon: Elfreda DELENA Bathe, MD;  Location: [facility] ORS;  Service: Pulmonary;  Laterality: N/A;   HEMORRHOID SURGERY     lung mass removed     PERIPHERAL VASCULAR CATHETERIZATION Left 04/25/2015   Procedure: Lower Extremity Angiography;  Surgeon: Selinda GORMAN Gu, MD;  Location: [facility] INVASIVE CV LAB;  Service: Cardiovascular;  Laterality: Left;   PERIPHERAL VASCULAR CATHETERIZATION Left 04/25/2015   Procedure: Lower Extremity Intervention;  Surgeon: Selinda GORMAN Gu, MD;  Location: [facility];  Service: Cardiovascular;  Laterality: Left;   PERIPHERAL VASCULAR CATHETERIZATION Left 01/02/2016   Procedure: Lower Extremity Angiography;  Surgeon: Selinda GORMAN Gu,  MD;  Location: [facility];  Service: Cardiovascular;  Laterality: Left;   PERIPHERAL VASCULAR CATHETERIZATION  01/02/2016   Procedure: Lower Extremity Intervention;  Surgeon: Selinda GORMAN Gu, MD;  Location: [facility];  Service: Cardiovascular;;   THROMBECTOMY FEMORAL ARTERY Left 04/26/2015   Procedure: THROMBECTOMY FEMORAL ARTERY;  Surgeon: Selinda GORMAN Gu, MD;  Location: [facility] ORS;  Service: Vascular;  Laterality: Left;   VENTRAL HERNIA REPAIR N/A 07/25/2023   Procedure: HERNIA REPAIR VENTRAL ADULT, open;  Surgeon: Desiderio Schanz, MD;  Location: [facility] ORS;  Service: General;  Laterality: N/A;    VISCERAL ANGIOGRAPHY N/A 04/06/2024   Procedure: VISCERAL ANGIOGRAPHY;  Surgeon: Gu Selinda GORMAN, MD;  Location: [facility];  Service: Cardiovascular;  Laterality: N/A;   VISCERAL ANGIOGRAPHY N/A 05/21/2024   Procedure: VISCERAL ANGIOGRAPHY;  Surgeon: Gu Selinda GORMAN, MD;  Location: [facility];  Service: Cardiovascular;  Laterality: N/A;    Family History  Problem Relation Age of Onset   Hypertension Mother    Hypertension Father     Allergies  Allergen Reactions   Tylenol  [Acetaminophen ] Anaphylaxis   Aspirin  Nausea And Vomiting and Other (See Comments)    Pt states aspirin  makes her cramp and have to use coated kind.   Zyrtec [Cetirizine] Other (See Comments)    Upset stomach   Tramadol Rash       Latest Ref Rng & Units 04/24/2024    1:30 PM 04/09/2024    4:35 AM 04/08/2024    4:27 AM  CBC  WBC 3.4 - 10.8 x10E3/uL 5.2  4.6  4.6   Hemoglobin 11.1 - 15.9 g/dL 89.0  9.2  8.7   Hematocrit 34.0 - 46.6 % 34.1  27.4  25.5   Platelets 150 - 450 x10E3/uL 318  226  201       CMP     Component Value Date/Time   NA 139 04/24/2024 1330   NA 131 (L) 10/15/2013 1123   K 4.4 04/24/2024 1330   K 3.6 10/15/2013 1123   CL 103 04/24/2024 1330   CL 102 10/15/2013 1123   CO2 20 04/24/2024 1330   CO2 24 10/15/2013 1123   GLUCOSE 93 04/24/2024 1330   GLUCOSE 112 (H) 04/10/2024 0435   GLUCOSE 111 (H) 10/15/2013 1123   BUN 11 05/21/2024 1015   BUN 9 04/24/2024 1330   BUN 12 10/15/2013 1123   CREATININE 0.77 05/21/2024 1015   CREATININE 1.20 10/15/2013 1123   CALCIUM  10.7 (H) 04/24/2024 1330   CALCIUM  9.1 10/15/2013 1123   PROT 7.4 04/24/2024 1330   ALBUMIN 4.4 04/24/2024 1330   AST 16 04/24/2024 1330   ALT 11 04/24/2024 1330   ALKPHOS 80 04/24/2024 1330   BILITOT 0.3 04/24/2024 1330   EGFR 81 04/24/2024 1330   GFRNONAA >60 05/21/2024 1015   GFRNONAA 50 (L) 10/15/2013 1123     No results found.     Assessment & Plan:   1. Mesenteric ischemia (Primary) Today  noninvasive studies indicate improvement following second intervention.  However the patient is not note any improvement in her symptoms.  Based on this I have recommended that we have her evaluated from a GI perspective as noted below.  The patient is advised that taking her Plavix  is very important as if not she can have thrombosis of her stent that was recently placed.  In addition not taking her hypertensive medications can also cause issues as well.  Will plan to have the patient return  in 3 months with noninvasive studies.  2. Periumbilical abdominal pain Today the patient's noninvasive studies show a known occlusion of the SMA but her celiac artery velocities are much improved from prior studies.  Despite this she continues to have no change or improvement in her symptoms.  Based on this I think it would be beneficial for her to be evaluated by gastroenterology to ensure that there is no other underlying cause for her ongoing pain and discomfort.  3. Essential hypertension Continue antihypertensive medications as already ordered, these medications have been reviewed and there are no changes at this time.   Current Outpatient Medications on File Prior to Visit  Medication Sig Dispense Refill   albuterol  (VENTOLIN  HFA) 108 (90 Base) MCG/ACT inhaler Inhale 2 puffs into the lungs every 4 (four) hours as needed for wheezing or shortness of breath. 9 g 1   alum & mag hydroxide-simeth (MAALOX/MYLANTA) 200-200-20 MG/5ML suspension Take 30 mLs by mouth every 6 (six) hours as needed for indigestion or heartburn. 355 mL 0   aspirin  EC 81 MG EC tablet Take 1 tablet (81 mg total) by mouth daily. 90 tablet 1   atorvastatin  (LIPITOR ) 80 MG tablet Take 1 tablet (80 mg total) by mouth daily. 90 tablet 3   budesonide -formoterol  (SYMBICORT ) 80-4.5 MCG/ACT inhaler INHALE 2 PUFFS ONCE DAILY 22 g 2   Cholecalciferol  (VITAMIN D -3 PO) Take 1 tablet by mouth daily.     cyclobenzaprine  (FLEXERIL ) 5 MG tablet Take 1  tablet (5 mg total) by mouth at bedtime as needed for muscle spasms. 30 tablet 3   ezetimibe  (ZETIA ) 10 MG tablet Take 1 tablet (10 mg total) by mouth daily. 90 tablet 3   ferrous sulfate  325 (65 FE) MG tablet Take 325 mg by mouth daily.     gabapentin  (NEURONTIN ) 100 MG capsule Take 1 capsule (100 mg total) by mouth 3 (three) times daily. 90 capsule 0   losartan  (COZAAR ) 50 MG tablet Take 2 tablets (100 mg total) by mouth daily. 180 tablet 3   metoprolol  succinate (TOPROL -XL) 50 MG 24 hr tablet Take 1 tablet (50 mg total) by mouth daily. Take with or immediately following a meal. 90 tablet 0   ondansetron  (ZOFRAN -ODT) 4 MG disintegrating tablet Take 1 tablet (4 mg total) by mouth every 8 (eight) hours as needed for nausea or vomiting. 12 tablet 0   oxyCODONE  (ROXICODONE ) 5 MG immediate release tablet Take 1 tablet (5 mg total) by mouth daily as needed for severe pain (pain score 7-10). 30 tablet 0   thiamine  (VITAMIN B-1) 100 MG tablet Take 1 tablet (100 mg total) by mouth daily. 15 tablet 0   No current facility-administered medications on file prior to visit.    There are no Patient Instructions on file for this visit. No follow-ups on file.   Rashan Rounsaville E Keirstyn Aydt, NP

## 2024-06-29 ENCOUNTER — Ambulatory Visit (INDEPENDENT_AMBULATORY_CARE_PROVIDER_SITE_OTHER): Payer: 59 | Admitting: Internal Medicine

## 2024-06-29 ENCOUNTER — Encounter: Payer: Self-pay | Admitting: Internal Medicine

## 2024-06-29 VITALS — BP 162/89 | HR 83 | Temp 98.6°F | Resp 16 | Ht 64.0 in | Wt 98.6 lb

## 2024-06-29 DIAGNOSIS — J449 Chronic obstructive pulmonary disease, unspecified: Secondary | ICD-10-CM | POA: Diagnosis not present

## 2024-06-29 DIAGNOSIS — F17200 Nicotine dependence, unspecified, uncomplicated: Secondary | ICD-10-CM

## 2024-06-29 DIAGNOSIS — C3411 Malignant neoplasm of upper lobe, right bronchus or lung: Secondary | ICD-10-CM | POA: Diagnosis not present

## 2024-06-29 MED ORDER — ALBUTEROL SULFATE HFA 108 (90 BASE) MCG/ACT IN AERS: 2.0000 | INHALATION_SPRAY | RESPIRATORY_TRACT | 1 refills | Status: AC | PRN

## 2024-06-29 MED ORDER — BUDESONIDE-FORMOTEROL FUMARATE 80-4.5 MCG/ACT IN AERO: INHALATION_SPRAY | RESPIRATORY_TRACT | 2 refills | Status: AC

## 2024-06-29 NOTE — Progress Notes (Signed)
 [facility] [address] [place], [place] [address]  Pulmonary Sleep Medicine   Office Visit Note  Patient Name: [name] DOB: 12-17-1955 MRN [id number]  Date of Service: 06/29/2024  Complaints/HPI: She had PFT done and this shows MILD COPD. She is still smoking. Sates she knows she needs to quit smoking. Patient states she has no cough and no congestion noted. She has no fevers. She has been taking inhalers and will be refilled today. Patient being followed by surgical onclogy for pulm nodules  Office Spirometry Results:     ROS  General: (-) fever, (-) chills, (-) night sweats, (-) weakness Skin: (-) rashes, (-) itching,. Eyes: (-) visual changes, (-) redness, (-) itching. Nose and Sinuses: (-) nasal stuffiness or itchiness, (-) postnasal drip, (-) nosebleeds, (-) sinus trouble. Mouth and Throat: (-) sore throat, (-) hoarseness. Neck: (-) swollen glands, (-) enlarged thyroid , (-) neck pain. Respiratory: + cough, (-) bloody sputum, + shortness of breath, - wheezing. Cardiovascular: - ankle swelling, (-) chest pain. Lymphatic: (-) lymph node enlargement. Neurologic: (-) numbness, (-) tingling. Psychiatric: (-) anxiety, (-) depression   Current Medication: Outpatient Encounter Medications as of 06/29/2024  Medication Sig   albuterol  (VENTOLIN  HFA) 108 (90 Base) MCG/ACT inhaler Inhale 2 puffs into the lungs every 4 (four) hours as needed for wheezing or shortness of breath.   aspirin  EC 81 MG EC tablet Take 1 tablet (81 mg total) by mouth daily.   budesonide -formoterol  (SYMBICORT ) 80-4.5 MCG/ACT inhaler INHALE 2 PUFFS ONCE DAILY   cyclobenzaprine  (FLEXERIL ) 5 MG tablet Take 1 tablet (5 mg total) by mouth at bedtime as needed for muscle spasms.   oxyCODONE  (ROXICODONE ) 5 MG immediate release tablet Take 1 tablet (5 mg total) by mouth daily as needed for severe pain (pain score 7-10).   [DISCONTINUED] alum & mag hydroxide-simeth (MAALOX/MYLANTA) 200-200-20  MG/5ML suspension Take 30 mLs by mouth every 6 (six) hours as needed for indigestion or heartburn.   [DISCONTINUED] atorvastatin  (LIPITOR ) 80 MG tablet Take 1 tablet (80 mg total) by mouth daily.   [DISCONTINUED] Cholecalciferol  (VITAMIN D -3 PO) Take 1 tablet by mouth daily.   [DISCONTINUED] clopidogrel  (PLAVIX ) 75 MG tablet Take 1 tablet (75 mg total) by mouth daily.   [DISCONTINUED] ezetimibe  (ZETIA ) 10 MG tablet Take 1 tablet (10 mg total) by mouth daily.   [DISCONTINUED] ferrous sulfate  325 (65 FE) MG tablet Take 325 mg by mouth daily.   [DISCONTINUED] gabapentin  (NEURONTIN ) 100 MG capsule Take 1 capsule (100 mg total) by mouth 3 (three) times daily.   [DISCONTINUED] losartan  (COZAAR ) 50 MG tablet Take 2 tablets (100 mg total) by mouth daily.   [DISCONTINUED] metoprolol  succinate (TOPROL -XL) 50 MG 24 hr tablet Take 1 tablet (50 mg total) by mouth daily. Take with or immediately following a meal.   [DISCONTINUED] ondansetron  (ZOFRAN -ODT) 4 MG disintegrating tablet Take 1 tablet (4 mg total) by mouth every 8 (eight) hours as needed for nausea or vomiting.   [DISCONTINUED] thiamine  (VITAMIN B-1) 100 MG tablet Take 1 tablet (100 mg total) by mouth daily.   No facility-administered encounter medications on file as of 06/29/2024.    Surgical History: Past Surgical History:  Procedure Laterality Date   ABDOMINAL HYSTERECTOMY     APPENDECTOMY     CARDIAC CATHETERIZATION     CARDIAC SURGERY     CHOLECYSTECTOMY     COLONOSCOPY  12/15/2011   OH->bleeding internal hemorrhoids, otherwise normal   COLONOSCOPY WITH PROPOFOL  N/A 04/12/2015   Procedure: COLONOSCOPY WITH PROPOFOL ;  Surgeon: Rogelia Copping, MD;  Location: [facility] ENDOSCOPY;  Service: Endoscopy;  Laterality: N/A;   COLONOSCOPY WITH PROPOFOL  N/A 01/05/2020   Procedure: COLONOSCOPY WITH PROPOFOL ;  Surgeon: Copping Rogelia, MD;  Location: [facility] ENDOSCOPY;  Service: Endoscopy;  Laterality: N/A;   ENDARTERECTOMY FEMORAL Left 04/26/2015   Procedure:  ENDARTERECTOMY FEMORAL;  Surgeon: Selinda GORMAN Gu, MD;  Location: [facility] ORS;  Service: Vascular;  Laterality: Left;   ENDOVASCULAR STENT INSERTION Bilateral    Legs   FLEXIBLE BRONCHOSCOPY N/A 02/02/2015   Procedure: FLEXIBLE BRONCHOSCOPY;  Surgeon: Elfreda DELENA Bathe, MD;  Location: [facility] ORS;  Service: Pulmonary;  Laterality: N/A;   HEMORRHOID SURGERY     lung mass removed     PERIPHERAL VASCULAR CATHETERIZATION Left 04/25/2015   Procedure: Lower Extremity Angiography;  Surgeon: Selinda GORMAN Gu, MD;  Location: [facility] INVASIVE CV LAB;  Service: Cardiovascular;  Laterality: Left;   PERIPHERAL VASCULAR CATHETERIZATION Left 04/25/2015   Procedure: Lower Extremity Intervention;  Surgeon: Selinda GORMAN Gu, MD;  Location: [facility] INVASIVE CV LAB;  Service: Cardiovascular;  Laterality: Left;   PERIPHERAL VASCULAR CATHETERIZATION Left 01/02/2016   Procedure: Lower Extremity Angiography;  Surgeon: Selinda GORMAN Gu, MD;  Location: [facility] INVASIVE CV LAB;  Service: Cardiovascular;  Laterality: Left;   PERIPHERAL VASCULAR CATHETERIZATION  01/02/2016   Procedure: Lower Extremity Intervention;  Surgeon: Selinda GORMAN Gu, MD;  Location: [facility] INVASIVE CV LAB;  Service: Cardiovascular;;   THROMBECTOMY FEMORAL ARTERY Left 04/26/2015   Procedure: THROMBECTOMY FEMORAL ARTERY;  Surgeon: Selinda GORMAN Gu, MD;  Location: [facility] ORS;  Service: Vascular;  Laterality: Left;   VENTRAL HERNIA REPAIR N/A 07/25/2023   Procedure: HERNIA REPAIR VENTRAL ADULT, open;  Surgeon: Desiderio Schanz, MD;  Location: [facility] ORS;  Service: General;  Laterality: N/A;   VISCERAL ANGIOGRAPHY N/A 04/06/2024   Procedure: VISCERAL ANGIOGRAPHY;  Surgeon: Gu Selinda GORMAN, MD;  Location: [facility] INVASIVE CV LAB;  Service: Cardiovascular;  Laterality: N/A;   VISCERAL ANGIOGRAPHY N/A 05/21/2024   Procedure: VISCERAL ANGIOGRAPHY;  Surgeon: Gu Selinda GORMAN, MD;  Location: [facility] INVASIVE CV LAB;  Service: Cardiovascular;  Laterality: N/A;    Medical History: Past Medical History:  Diagnosis Date    Adenocarcinoma of upper lobe of right lung (HCC)    a.) s/p posterior segmentectomy on 03/20/23 at [facility]   Anemia    Arthritis    Asthma    Carotid artery stenosis    a.) doppler 05/14/2017: 1-39% BICA; b.) doppler 04/05/2018: 50-69% BICA; c.) doppler 06/12/2019: 40-59% BICA; d.) doppler 06/14/2020, 06/14/2021: 1-39% BICA; e.) doppler 06/19/2022: 1.39% RICA, 40-59% LICA   Colon polyp    COPD (chronic obstructive pulmonary disease) (HCC)    Coronary artery disease 04/03/2006   a.) LHC 04/03/2006: 25% mLM, 25% mLAD, 50% D1, 25/50% mLCx, 50% dLCx, 25% OM2, 100% pRCA --> med mgmt; b.) LHC 04/25/2010: 25% pLM, 25% pLAD, 50% pLCx, 70% dLCx, 100% pRCA --> med mgmt   Depression    Diastolic dysfunction    a.) TTE 04/05/2018: EF 65%, no RWMAs, G1DD, triv MR/TR; b.) TTE 10/15/2022: EF 88%, mod LVH, G1DD, triv TR, mild MR; c.) TTE 02/27/2023: EF 65%, no RWMAs, triv TR/MR, RVSP 35; d.) TTE 03/21/2023: EF 65%, no RWMAs, norm RVSF, PASP 44   Environmental allergies    Headache    Hyperlipidemia    Hypertension    Hypoxic respiratory failure (HCC)    Internal hemorrhoids    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[facility])    a.) thinks it was in 2007 --> LHC 04/03/2006 revealed a  100% pRCA --> medically managed   Necrotizing pneumonia (HCC)    Pulmonary HTN (HCC)    a.) TTE 02/27/2023: RVSP 35 mmHg; b.) TTE 03/21/2023: PASP 44 mmHg   PVD (peripheral vascular disease) with claudication    Shortness of breath dyspnea    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    Substance abuse (HCC)    Substance induced mood disorder (HCC)    Suicidal ideation    Thrombocytopenia    Tobacco abuse    Unstable angina (HCC)    Ventral hernia    Vitamin D  deficiency     Family History: Family History  Problem Relation Age of Onset   Hypertension Mother    Hypertension Father     Social History: Social History   Socioeconomic History   Marital status: Single    Spouse name: Not on file   Number of children: Not on  file   Years of education: Not on file   Highest education level: Not on file  Occupational History   Not on file  Tobacco Use   Smoking status: Every Day    Current packs/day: 1.00    Types: Cigarettes    Passive exposure: Past   Smokeless tobacco: Never   Tobacco comments:    1 PPD  Vaping Use   Vaping status: Never Used  Substance and Sexual Activity   Alcohol use: Not Currently    Comment: weekends, couple beers, pint of liquer only on weekends.  Last drank approx 1 month ago.   Drug use: No   Sexual activity: Not Currently  Other Topics Concern   Not on file  Social History Narrative   Lives with family    Social Drivers of Health   Financial Resource Strain: Low Risk  (03/21/2023)   Received from [facility]   Overall Financial Resource Strain (CARDIA)    Difficulty of Paying Living Expenses: Not hard at all  Food Insecurity: No Food Insecurity (04/13/2024)   Hunger Vital Sign    Worried About Running Out of Food in the Last Year: Never true    Ran Out of Food in the Last Year: Never true  Transportation Needs: No Transportation Needs (04/13/2024)   PRAPARE - Administrator, Civil Service (Medical): No    Lack of Transportation (Non-Medical): No  Physical Activity: Inactive (04/04/2018)   Exercise Vital Sign    Days of Exercise per Week: 0 days    Minutes of Exercise per Session: 0 min  Stress: No Stress Concern Present (04/04/2018)   [employer] of Occupational Health - Occupational Stress Questionnaire    Feeling of Stress : Only a little  Social Connections: Socially Isolated (04/06/2024)   Social Connection and Isolation Panel    Frequency of Communication with Friends and Family: Once a week    Frequency of Social Gatherings with Friends and Family: Once a week    Attends Religious Services: 1 to 4 times per year    Active Member of [employer] or Organizations: No    Attends Banker Meetings: Never    Marital Status: Never married   Intimate Partner Violence: Not At Risk (04/13/2024)   Humiliation, Afraid, Rape, and Kick questionnaire    Fear of Current or Ex-Partner: No    Emotionally Abused: No    Physically Abused: No    Sexually Abused: No    Vital Signs: Blood pressure (!) 162/89, pulse 83, temperature 98.6 F (37 C), resp. rate 16,  height 5' 4 (1.626 m), weight 98 lb 9.6 oz (44.7 kg), SpO2 99%.  Examination: General Appearance: The patient is well-developed, well-nourished, and in no distress. Skin: Gross inspection of skin unremarkable. Head: normocephalic, no gross deformities. Eyes: no gross deformities noted. ENT: ears appear grossly normal no exudates. Neck: Supple. No thyromegaly. No LAD. Respiratory: no rhonchi noted today. Cardiovascular: Normal S1 and S2 without murmur or rub. Extremities: No cyanosis. pulses are equal. Neurologic: Alert and oriented. No involuntary movements.  LABS: Recent Results (from the past 2160 hours)  Lipase, blood     Status: None   Collection Time: 04/03/24 12:02 PM  Result Value Ref Range   Lipase 29 11 - 51 U/L    Comment: Performed at [facility], [address] [place], [place] [address]  Comprehensive metabolic panel     Status: Abnormal   Collection Time: 04/03/24 12:02 PM  Result Value Ref Range   Sodium 136 135 - 145 mmol/L   Potassium 3.2 (L) 3.5 - 5.1 mmol/L   Chloride 101 98 - 111 mmol/L   CO2 20 (L) 22 - 32 mmol/L   Glucose, Bld 86 70 - 99 mg/dL    Comment: Glucose reference range applies only to samples taken after fasting for at least 8 hours.   BUN 21 8 - 23 mg/dL   Creatinine, Ser 8.17 (H) 0.44 - 1.00 mg/dL   Calcium  10.1 8.9 - 10.3 mg/dL   Total Protein 7.3 6.5 - 8.1 g/dL   Albumin 4.0 3.5 - 5.0 g/dL   AST 22 15 - 41 U/L   ALT 14 0 - 44 U/L   Alkaline Phosphatase 50 38 - 126 U/L   Total Bilirubin 0.7 0.0 - 1.2 mg/dL   GFR, Estimated 30 (L) >60 mL/min    Comment: (NOTE) Calculated using the CKD-EPI Creatinine Equation  (2021)    Anion gap 15 5 - 15    Comment: Performed at [facility], [address] [place], [place] [address]  CBC     Status: Abnormal   Collection Time: 04/03/24 12:02 PM  Result Value Ref Range   WBC 6.1 4.0 - 10.5 K/uL   RBC 3.60 (L) 3.87 - 5.11 MIL/uL   Hemoglobin 11.9 (L) 12.0 - 15.0 g/dL   HCT 64.2 (L) 63.9 - 53.9 %   MCV 99.2 80.0 - 100.0 fL   MCH 33.1 26.0 - 34.0 pg   MCHC 33.3 30.0 - 36.0 g/dL   RDW 86.6 88.4 - 84.4 %   Platelets 262 150 - 400 K/uL   nRBC 0.0 0.0 - 0.2 %    Comment: Performed at [facility], [address] [place], [place] [address]  Magnesium      Status: Abnormal   Collection Time: 04/03/24 12:02 PM  Result Value Ref Range   Magnesium  1.4 (L) 1.7 - 2.4 mg/dL    Comment: Performed at [facility], [address] [place], [place] [address]  Urinalysis, Routine w reflex microscopic -Urine, Clean Catch     Status: Abnormal   Collection Time: 04/03/24  5:39 PM  Result Value Ref Range   Color, Urine YELLOW (A) YELLOW   APPearance HAZY (A) CLEAR   Specific Gravity, Urine 1.024 1.005 - 1.030   pH 5.0 5.0 - 8.0   Glucose, UA NEGATIVE NEGATIVE mg/dL   Hgb urine dipstick NEGATIVE NEGATIVE   Bilirubin Urine NEGATIVE NEGATIVE   Ketones, ur NEGATIVE NEGATIVE mg/dL   Protein, ur NEGATIVE NEGATIVE mg/dL  Nitrite NEGATIVE NEGATIVE   Leukocytes,Ua LARGE (A) NEGATIVE   RBC / HPF 21-50 0 - 5 RBC/hpf   WBC, UA 21-50 0 - 5 WBC/hpf   Bacteria, UA RARE (A) NONE SEEN   Squamous Epithelial / HPF 0-5 0 - 5 /HPF   Hyaline Casts, UA PRESENT     Comment: Performed at [facility], [address] [place], [place] [address]  Urine Culture     Status: None   Collection Time: 04/03/24  5:39 PM   Specimen: Urine, Clean Catch  Result Value Ref Range   Specimen Description      URINE, CLEAN CATCH Performed at [facility], [address], [place], [place] [address]    Special Requests      NONE Performed at [facility], [address] [place], [place] [address]    Culture      NO GROWTH Performed at [facility], [address], [place], [place] [address]    Report Status 04/04/2024 FINAL   Lipase, blood     Status: None   Collection Time: 04/05/24  8:53 PM  Result Value Ref Range   Lipase 31 11 - 51 U/L    Comment: Performed at [facility], [address] [place], [place] [address]  Comprehensive metabolic panel     Status: Abnormal   Collection Time: 04/05/24  8:53 PM  Result Value Ref Range   Sodium 137 135 - 145 mmol/L   Potassium 3.3 (L) 3.5 - 5.1 mmol/L   Chloride 103 98 - 111 mmol/L   CO2 22 22 - 32 mmol/L   Glucose, Bld 104 (H) 70 - 99 mg/dL    Comment: Glucose reference range applies only to samples taken after fasting for at least 8 hours.   BUN 16 8 - 23 mg/dL   Creatinine, Ser 8.79 (H) 0.44 - 1.00 mg/dL   Calcium  10.2 8.9 - 10.3 mg/dL   Total Protein 7.8 6.5 - 8.1 g/dL   Albumin 4.2 3.5 - 5.0 g/dL   AST 23 15 - 41 U/L   ALT 16 0 - 44 U/L   Alkaline Phosphatase 61 38 - 126 U/L   Total Bilirubin 0.4 0.0 - 1.2 mg/dL   GFR, Estimated 49 (L) >60 mL/min    Comment: (NOTE) Calculated using the CKD-EPI Creatinine Equation (2021)    Anion gap 12 5 - 15    Comment: Performed at [facility], [address] [place], [place] [address]  CBC     Status: Abnormal   Collection Time: 04/05/24  8:53 PM  Result Value Ref Range   WBC 6.0 4.0 - 10.5 K/uL   RBC 3.40 (L) 3.87 - 5.11 MIL/uL   Hemoglobin 11.2 (L) 12.0 - 15.0 g/dL   HCT 65.9 (L) 63.9 - 53.9 %   MCV 100.0 80.0 - 100.0 fL   MCH 32.9 26.0 - 34.0 pg   MCHC 32.9 30.0 - 36.0 g/dL   RDW 86.5 88.4 - 84.4 %   Platelets 301 150 - 400 K/uL   nRBC 0.0 0.0 - 0.2 %    Comment: Performed at [facility], [address] [place], [place] [address]  Lactic acid, plasma     Status: Abnormal   Collection Time: 04/06/24 12:59 AM  Result Value Ref Range   Lactic Acid, Venous 2.0 (HH) 0.5 - 1.9  mmol/L    Comment: CRITICAL RESULT CALLED TO, READ BACK BY AND VERIFIED WITH SHAWN  ROUTH @ 04/06/2024 0146  AB Performed at [facility], [address] [place], [place] [address]   Chlamydia/NGC rt PCR [facility] only)     Status: None   Collection Time: 04/06/24  1:39 AM   Specimen: Vaginal  Result Value Ref Range   Specimen source GC/Chlam SWAB    Chlamydia Tr NOT DETECTED NOT DETECTED   N gonorrhoeae NOT DETECTED NOT DETECTED    Comment: (NOTE) This CT/NG assay has not been evaluated in patients with a history of  hysterectomy. Performed at [facility], [address] [place], [place] [address]   Wet prep, genital     Status: Abnormal   Collection Time: 04/06/24  1:59 AM   Specimen: Vaginal  Result Value Ref Range   Yeast Wet Prep HPF POC NONE SEEN NONE SEEN   Trich, Wet Prep PRESENT (A) NONE SEEN   Clue Cells Wet Prep HPF POC NONE SEEN NONE SEEN   WBC, Wet Prep HPF POC >=10 (A) <10   Sperm NONE SEEN     Comment: Performed at [facility], [address] [place], [place] [address]  Lactic acid, plasma     Status: None   Collection Time: 04/06/24  5:19 AM  Result Value Ref Range   Lactic Acid, Venous 1.3 0.5 - 1.9 mmol/L    Comment: Performed at [facility], [address] [place], [place] [address]  HIV Antibody (routine testing w rflx)     Status: None   Collection Time: 04/06/24  5:19 AM  Result Value Ref Range   HIV Screen 4th Generation wRfx Non Reactive Non Reactive    Comment: Performed at [facility], [address] [place], [place] [address]  RPR     Status: None   Collection Time: 04/06/24  5:19 AM  Result Value Ref Range   RPR Ser Ql NON REACTIVE NON REACTIVE    Comment: Performed at [facility], [address], [place], [place] [address]  Hepatitis panel, acute     Status: None   Collection Time: 04/06/24  5:19 AM  Result Value Ref Range   Hepatitis B Surface Ag NON REACTIVE NON REACTIVE   HCV Ab NON  REACTIVE NON REACTIVE    Comment: (NOTE) Nonreactive HCV antibody screen is consistent with no HCV infections,  unless recent infection is suspected or other evidence exists to indicate HCV infection.     Hep A IgM NON REACTIVE NON REACTIVE   Hep B C IgM NON REACTIVE NON REACTIVE    Comment: Performed at [facility], [address] [place], [place] [address]  Heparin  level (unfractionated)     Status: None   Collection Time: 04/06/24  6:32 AM  Result Value Ref Range   Heparin  Unfractionated 0.42 0.30 - 0.70 IU/mL    Comment: (NOTE) The clinical reportable range upper limit is being lowered to >1.10 to align with the FDA approved guidance for the current laboratory assay.  If heparin  results are below expected values, and patient dosage has  been confirmed, suggest follow up testing of antithrombin III levels. Performed at [facility], [address] [place], [place] [address]   Basic metabolic panel     Status: Abnormal   Collection Time: 04/06/24 12:01 PM  Result Value Ref Range   Sodium 136 135 - 145 mmol/L   Potassium 3.5 3.5 - 5.1 mmol/L   Chloride 109 98 - 111 mmol/L   CO2 20 (  L) 22 - 32 mmol/L   Glucose, Bld 82 70 - 99 mg/dL    Comment: Glucose reference range applies only to samples taken after fasting for at least 8 hours.   BUN 13 8 - 23 mg/dL   Creatinine, Ser 8.91 (H) 0.44 - 1.00 mg/dL   Calcium  8.6 (L) 8.9 - 10.3 mg/dL   GFR, Estimated 56 (L) >60 mL/min    Comment: (NOTE) Calculated using the CKD-EPI Creatinine Equation (2021)    Anion gap 7 5 - 15    Comment: Performed at [facility], [address] [place], [place] [address]  CBC     Status: Abnormal   Collection Time: 04/06/24 12:01 PM  Result Value Ref Range   WBC 4.7 4.0 - 10.5 K/uL   RBC 2.76 (L) 3.87 - 5.11 MIL/uL   Hemoglobin 8.9 (L) 12.0 - 15.0 g/dL   HCT 72.1 (L) 63.9 - 53.9 %   MCV 100.7 (H) 80.0 - 100.0 fL   MCH 32.2 26.0 - 34.0 pg   MCHC 32.0 30.0 - 36.0 g/dL    RDW 86.5 88.4 - 84.4 %   Platelets 224 150 - 400 K/uL   nRBC 0.0 0.0 - 0.2 %    Comment: Performed at [facility], [address] [place], [place] [address]  Heparin  level (unfractionated)     Status: Abnormal   Collection Time: 04/06/24 12:01 PM  Result Value Ref Range   Heparin  Unfractionated 0.28 (L) 0.30 - 0.70 IU/mL    Comment: (NOTE) The clinical reportable range upper limit is being lowered to >1.10 to align with the FDA approved guidance for the current laboratory assay.  If heparin  results are below expected values, and patient dosage has  been confirmed, suggest follow up testing of antithrombin III levels. Performed at [facility], [address] [place], [place] [address]   Heparin  level (unfractionated)     Status: None   Collection Time: 04/06/24 10:33 PM  Result Value Ref Range   Heparin  Unfractionated 0.59 0.30 - 0.70 IU/mL    Comment: (NOTE) The clinical reportable range upper limit is being lowered to >1.10 to align with the FDA approved guidance for the current laboratory assay.  If heparin  results are below expected values, and patient dosage has  been confirmed, suggest follow up testing of antithrombin III levels. Performed at [facility], [address] [place], [place] [address]   Hemoglobin     Status: Abnormal   Collection Time: 04/06/24 10:33 PM  Result Value Ref Range   Hemoglobin 9.2 (L) 12.0 - 15.0 g/dL    Comment: Performed at [facility], [address] [place], [place] [address]  Heparin  level (unfractionated)     Status: None   Collection Time: 04/07/24  6:02 AM  Result Value Ref Range   Heparin  Unfractionated 0.38 0.30 - 0.70 IU/mL    Comment: (NOTE) The clinical reportable range upper limit is being lowered to >1.10 to align with the FDA approved guidance for the current laboratory assay.  If heparin  results are below expected values, and patient dosage has  been confirmed, suggest  follow up testing of antithrombin III levels. Performed at [facility], [address] [place], [place] [address]   CBC     Status: Abnormal   Collection Time: 04/07/24  6:02 AM  Result Value Ref Range   WBC 6.4 4.0 - 10.5 K/uL   RBC 2.63 (L) 3.87 - 5.11 MIL/uL   Hemoglobin  8.6 (L) 12.0 - 15.0 g/dL   HCT 72.9 (L) 63.9 - 53.9 %   MCV 102.7 (H) 80.0 - 100.0 fL   MCH 32.7 26.0 - 34.0 pg   MCHC 31.9 30.0 - 36.0 g/dL   RDW 86.5 88.4 - 84.4 %   Platelets 230 150 - 400 K/uL   nRBC 0.0 0.0 - 0.2 %    Comment: Performed at [facility], [address] [place], [place] [address]  Urinalysis, Routine w reflex microscopic -Urine, Clean Catch     Status: Abnormal   Collection Time: 04/07/24 11:10 PM  Result Value Ref Range   Color, Urine COLORLESS (A) YELLOW   APPearance CLEAR (A) CLEAR   Specific Gravity, Urine 1.003 (L) 1.005 - 1.030   pH 6.0 5.0 - 8.0   Glucose, UA NEGATIVE NEGATIVE mg/dL   Hgb urine dipstick NEGATIVE NEGATIVE   Bilirubin Urine NEGATIVE NEGATIVE   Ketones, ur NEGATIVE NEGATIVE mg/dL   Protein, ur NEGATIVE NEGATIVE mg/dL   Nitrite NEGATIVE NEGATIVE   Leukocytes,Ua NEGATIVE NEGATIVE    Comment: Performed at [facility], [address], [place], [place] [address]  Urine Drug Screen, Qualitative ([facility] only)     Status: Abnormal   Collection Time: 04/07/24 11:10 PM  Result Value Ref Range   Tricyclic, Ur Screen NONE DETECTED NONE DETECTED   Amphetamines, Ur Screen NONE DETECTED NONE DETECTED   MDMA (Ecstasy)Ur Screen NONE DETECTED NONE DETECTED   Cocaine Metabolite,Ur [place] NONE DETECTED NONE DETECTED   Opiate, Ur Screen POSITIVE (A) NONE DETECTED   Phencyclidine (PCP) Ur S NONE DETECTED NONE DETECTED   Cannabinoid 50 Ng, Ur [place] NONE DETECTED NONE DETECTED   Barbiturates, Ur Screen NONE DETECTED NONE DETECTED   Benzodiazepine, Ur Scrn NONE DETECTED NONE DETECTED   Methadone Scn, Ur NONE DETECTED NONE DETECTED    Comment: (NOTE) Tricyclics  + metabolites, urine    Cutoff 1000 ng/mL Amphetamines + metabolites, urine  Cutoff 1000 ng/mL MDMA (Ecstasy), urine              Cutoff 500 ng/mL Cocaine Metabolite, urine          Cutoff 300 ng/mL Opiate + metabolites, urine        Cutoff 300 ng/mL Phencyclidine (PCP), urine         Cutoff 25 ng/mL Cannabinoid, urine                 Cutoff 50 ng/mL Barbiturates + metabolites, urine  Cutoff 200 ng/mL Benzodiazepine, urine              Cutoff 200 ng/mL Methadone, urine                   Cutoff 300 ng/mL  The urine drug screen provides only a preliminary, unconfirmed analytical test result and should not be used for non-medical purposes. Clinical consideration and professional judgment should be applied to any positive drug screen result due to possible interfering substances. A more specific alternate chemical method must be used in order to obtain a confirmed analytical result. Gas chromatography / mass spectrometry (GC/MS) is the preferred confirm atory method. Performed at [facility], [address] [place], [place] [address]   CBC     Status: Abnormal   Collection Time: 04/08/24  4:27 AM  Result Value Ref Range   WBC 4.6 4.0 - 10.5 K/uL   RBC 2.61 (L) 3.87 - 5.11 MIL/uL   Hemoglobin 8.7 (L) 12.0 - 15.0 g/dL  HCT 25.5 (L) 36.0 - 46.0 %   MCV 97.7 80.0 - 100.0 fL   MCH 33.3 26.0 - 34.0 pg   MCHC 34.1 30.0 - 36.0 g/dL   RDW 86.7 88.4 - 84.4 %   Platelets 201 150 - 400 K/uL   nRBC 0.0 0.0 -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

## 2024-07-13 ENCOUNTER — Ambulatory Visit (INDEPENDENT_AMBULATORY_CARE_PROVIDER_SITE_OTHER): Admitting: Cardiovascular Disease

## 2024-07-13 ENCOUNTER — Encounter: Payer: Self-pay | Admitting: Cardiovascular Disease

## 2024-07-13 VITALS — BP 156/82 | HR 85 | Ht 64.0 in | Wt 98.8 lb

## 2024-07-13 DIAGNOSIS — I6523 Occlusion and stenosis of bilateral carotid arteries: Secondary | ICD-10-CM

## 2024-07-13 DIAGNOSIS — F17219 Nicotine dependence, cigarettes, with unspecified nicotine-induced disorders: Secondary | ICD-10-CM

## 2024-07-13 DIAGNOSIS — N179 Acute kidney failure, unspecified: Secondary | ICD-10-CM

## 2024-07-13 DIAGNOSIS — I10 Essential (primary) hypertension: Secondary | ICD-10-CM | POA: Diagnosis not present

## 2024-07-13 DIAGNOSIS — I2510 Atherosclerotic heart disease of native coronary artery without angina pectoris: Secondary | ICD-10-CM | POA: Diagnosis not present

## 2024-07-13 DIAGNOSIS — I2583 Coronary atherosclerosis due to lipid rich plaque: Secondary | ICD-10-CM

## 2024-07-13 DIAGNOSIS — Z8639 Personal history of other endocrine, nutritional and metabolic disease: Secondary | ICD-10-CM

## 2024-07-13 DIAGNOSIS — K559 Vascular disorder of intestine, unspecified: Secondary | ICD-10-CM

## 2024-07-13 DIAGNOSIS — R000 Tachycardia, unspecified: Secondary | ICD-10-CM

## 2024-07-13 MED ORDER — AMLODIPINE BESYLATE 10 MG PO TABS: 10.0000 mg | ORAL_TABLET | Freq: Every day | ORAL | 11 refills | Status: DC

## 2024-07-13 NOTE — Progress Notes (Signed)
 Cardiology Office Note   Date:  07/13/2024   ID:  [name], DOB 1956/04/19, MRN [id number]  PCP:  Liana Fish, NP  Cardiologist:  Denyse Bathe, MD      History of Present Illness: [name] is a 68 y.o. female who presents for  Chief Complaint  Patient presents with   Follow-up    3 month follow up    No chest pain or SOB.      Past Medical History:  Diagnosis Date   Adenocarcinoma of upper lobe of right lung (HCC)    a.) s/p posterior segmentectomy on 03/20/23 at [facility]   Anemia    Arthritis    Asthma    Carotid artery stenosis    a.) doppler 05/14/2017: 1-39% BICA; b.) doppler 04/05/2018: 50-69% BICA; c.) doppler 06/12/2019: 40-59% BICA; d.) doppler 06/14/2020, 06/14/2021: 1-39% BICA; e.) doppler 06/19/2022: 1.39% RICA, 40-59% LICA   Colon polyp    COPD (chronic obstructive pulmonary disease) (HCC)    Coronary artery disease 04/03/2006   a.) LHC 04/03/2006: 25% mLM, 25% mLAD, 50% D1, 25/50% mLCx, 50% dLCx, 25% OM2, 100% pRCA --> med mgmt; b.) LHC 04/25/2010: 25% pLM, 25% pLAD, 50% pLCx, 70% dLCx, 100% pRCA --> med mgmt   Depression    Diastolic dysfunction    a.) TTE 04/05/2018: EF 65%, no RWMAs, G1DD, triv MR/TR; b.) TTE 10/15/2022: EF 88%, mod LVH, G1DD, triv TR, mild MR; c.) TTE 02/27/2023: EF 65%, no RWMAs, triv TR/MR, RVSP 35; d.) TTE 03/21/2023: EF 65%, no RWMAs, norm RVSF, PASP 44   Environmental allergies    Headache    Hyperlipidemia    Hypertension    Hypoxic respiratory failure (HCC)    Internal hemorrhoids    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[facility])    a.) thinks it was in 2007 --> LHC 04/03/2006 revealed a 100% pRCA --> medically managed   Necrotizing pneumonia (HCC)    Pulmonary HTN (HCC)    a.) TTE 02/27/2023: RVSP 35 mmHg; b.) TTE 03/21/2023: PASP 44 mmHg   PVD (peripheral vascular disease) with claudication    Shortness of breath dyspnea    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    Substance abuse (HCC)    Substance induced mood  disorder (HCC)    Suicidal ideation    Thrombocytopenia    Tobacco abuse    Unstable angina (HCC)    Ventral hernia    Vitamin D  deficiency      Past Surgical History:  Procedure Laterality Date   ABDOMINAL HYSTERECTOMY     APPENDECTOMY     CARDIAC CATHETERIZATION     CARDIAC SURGERY     CHOLECYSTECTOMY     COLONOSCOPY  12/15/2011   OH->bleeding internal hemorrhoids, otherwise normal   COLONOSCOPY WITH PROPOFOL  N/A 04/12/2015   Procedure: COLONOSCOPY WITH PROPOFOL ;  Surgeon: Rogelia Copping, MD;  Location: [facility] ENDOSCOPY;  Service: Endoscopy;  Laterality: N/A;   COLONOSCOPY WITH PROPOFOL  N/A 01/05/2020   Procedure: COLONOSCOPY WITH PROPOFOL ;  Surgeon: Copping Rogelia, MD;  Location: [facility] ENDOSCOPY;  Service: Endoscopy;  Laterality: N/A;   ENDARTERECTOMY FEMORAL Left 04/26/2015   Procedure: ENDARTERECTOMY FEMORAL;  Surgeon: Selinda GORMAN Gu, MD;  Location: [facility] ORS;  Service: Vascular;  Laterality: Left;   ENDOVASCULAR STENT INSERTION Bilateral    Legs   FLEXIBLE BRONCHOSCOPY N/A 02/02/2015   Procedure: FLEXIBLE BRONCHOSCOPY;  Surgeon: Elfreda DELENA Bathe, MD;  Location: [facility] ORS;  Service: Pulmonary;  Laterality: N/A;   HEMORRHOID SURGERY     lung  mass removed     PERIPHERAL VASCULAR CATHETERIZATION Left 04/25/2015   Procedure: Lower Extremity Angiography;  Surgeon: Selinda GORMAN Gu, MD;  Location: [facility] INVASIVE CV LAB;  Service: Cardiovascular;  Laterality: Left;   PERIPHERAL VASCULAR CATHETERIZATION Left 04/25/2015   Procedure: Lower Extremity Intervention;  Surgeon: Selinda GORMAN Gu, MD;  Location: [facility] INVASIVE CV LAB;  Service: Cardiovascular;  Laterality: Left;   PERIPHERAL VASCULAR CATHETERIZATION Left 01/02/2016   Procedure: Lower Extremity Angiography;  Surgeon: Selinda GORMAN Gu, MD;  Location: [facility] INVASIVE CV LAB;  Service: Cardiovascular;  Laterality: Left;   PERIPHERAL VASCULAR CATHETERIZATION  01/02/2016   Procedure: Lower Extremity Intervention;  Surgeon: Selinda GORMAN Gu, MD;  Location: [facility] INVASIVE  CV LAB;  Service: Cardiovascular;;   THROMBECTOMY FEMORAL ARTERY Left 04/26/2015   Procedure: THROMBECTOMY FEMORAL ARTERY;  Surgeon: Selinda GORMAN Gu, MD;  Location: [facility] ORS;  Service: Vascular;  Laterality: Left;   VENTRAL HERNIA REPAIR N/A 07/25/2023   Procedure: HERNIA REPAIR VENTRAL ADULT, open;  Surgeon: Desiderio Schanz, MD;  Location: [facility] ORS;  Service: General;  Laterality: N/A;   VISCERAL ANGIOGRAPHY N/A 04/06/2024   Procedure: VISCERAL ANGIOGRAPHY;  Surgeon: Gu Selinda GORMAN, MD;  Location: [facility] INVASIVE CV LAB;  Service: Cardiovascular;  Laterality: N/A;   VISCERAL ANGIOGRAPHY N/A 05/21/2024   Procedure: VISCERAL ANGIOGRAPHY;  Surgeon: Gu Selinda GORMAN, MD;  Location: [facility] INVASIVE CV LAB;  Service: Cardiovascular;  Laterality: N/A;     Current Outpatient Medications  Medication Sig Dispense Refill   albuterol  (VENTOLIN  HFA) 108 (90 Base) MCG/ACT inhaler Inhale 2 puffs into the lungs every 4 (four) hours as needed for wheezing or shortness of breath. 9 g 1   amLODipine  (NORVASC ) 10 MG tablet Take 1 tablet (10 mg total) by mouth daily. 30 tablet 11   aspirin  EC 81 MG EC tablet Take 1 tablet (81 mg total) by mouth daily. 90 tablet 1   budesonide -formoterol  (SYMBICORT ) 80-4.5 MCG/ACT inhaler INHALE 2 PUFFS ONCE DAILY 22 g 2   cyclobenzaprine  (FLEXERIL ) 5 MG tablet Take 1 tablet (5 mg total) by mouth at bedtime as needed for muscle spasms. 30 tablet 3   oxyCODONE  (ROXICODONE ) 5 MG immediate release tablet Take 1 tablet (5 mg total) by mouth daily as needed for severe pain (pain score 7-10). 30 tablet 0   No current facility-administered medications for this visit.    Allergies:   Tylenol  [acetaminophen ], Aspirin , Zyrtec [cetirizine], and Tramadol    Social History:   reports that she has been smoking cigarettes. She has been exposed to tobacco smoke. She has never used smokeless tobacco. She reports that she does not currently use alcohol. She reports that she does not use drugs.   Family History:   family history includes Hypertension in her father and mother.    ROS:     Review of Systems  Constitutional: Negative.   HENT: Negative.    Eyes: Negative.   Respiratory: Negative.    Gastrointestinal: Negative.   Genitourinary: Negative.   Musculoskeletal: Negative.   Skin: Negative.   Neurological: Negative.   Endo/Heme/Allergies: Negative.   Psychiatric/Behavioral: Negative.    All other systems reviewed and are negative.     All other systems are reviewed and negative.    PHYSICAL EXAM: VS:  BP (!) 156/82   Pulse 85   Ht 5' 4 (1.626 m)   Wt 98 lb 12.8 oz (44.8 kg)   LMP  (LMP Unknown)   SpO2 97%   BMI 16.96 kg/m  , BMI  Body mass index is 16.96 kg/m. Last weight:  Wt Readings from Last 3 Encounters:  07/13/24 98 lb 12.8 oz (44.8 kg)  06/29/24 98 lb 9.6 oz (44.7 kg)  06/24/24 99 lb 9.6 oz (45.2 kg)     Physical Exam Constitutional:      Appearance: Normal appearance.  Cardiovascular:     Rate and Rhythm: Normal rate and regular rhythm.     Heart sounds: Normal heart sounds.  Pulmonary:     Effort: Pulmonary effort is normal.     Breath sounds: Normal breath sounds.  Musculoskeletal:     Right lower leg: No edema.     Left lower leg: No edema.  Neurological:     Mental Status: She is alert.       EKG:   Recent Labs: 04/24/2024: ALT 11; Hemoglobin 10.9; Magnesium  1.7; Platelets 318; Potassium 4.4; Sodium 139 05/21/2024: BUN 11; Creatinine, Ser 0.77    Lipid Panel    Component Value Date/Time   CHOL 202 (H) 06/18/2023 0707   CHOL 149 05/28/2022 1103   CHOL 147 10/16/2013 0439   TRIG 186 (H) 06/18/2023 0707   TRIG 122 10/16/2013 0439   HDL 61 06/18/2023 0707   HDL 79 05/28/2022 1103   HDL 31 (L) 10/16/2013 0439   CHOLHDL 3.3 06/18/2023 0707   VLDL 37 06/18/2023 0707   VLDL 24 10/16/2013 0439   LDLCALC 104 (H) 06/18/2023 0707   LDLCALC 55 05/28/2022 1103   LDLCALC 92 10/16/2013 0439      Other studies Reviewed: Additional studies/  records that were reviewed today include:  Review of the above records demonstrates:       No data to display            ASSESSMENT AND PLAN:    ICD-10-CM   1. Essential hypertension  I10 amLODipine  (NORVASC ) 10 MG tablet    PCV ECHOCARDIOGRAM COMPLETE   Uncontrolled as not taking meds every day. Added norvasc  10 mg daily.    2. AKI (acute kidney injury)  N17.9 amLODipine  (NORVASC ) 10 MG tablet    PCV ECHOCARDIOGRAM COMPLETE    3. Bilateral carotid artery stenosis  I65.23 amLODipine  (NORVASC ) 10 MG tablet    PCV ECHOCARDIOGRAM COMPLETE    4. Coronary artery disease due to lipid rich plaque  I25.10 amLODipine  (NORVASC ) 10 MG tablet   I25.83 PCV ECHOCARDIOGRAM COMPLETE    5. H/O hypercholesterolemia  Z86.39 amLODipine  (NORVASC ) 10 MG tablet    PCV ECHOCARDIOGRAM COMPLETE    6. Sinus tachycardia  R00.0 amLODipine  (NORVASC ) 10 MG tablet    PCV ECHOCARDIOGRAM COMPLETE    7. Cigarette nicotine  dependence with nicotine -induced disorder  F17.219 amLODipine  (NORVASC ) 10 MG tablet    PCV ECHOCARDIOGRAM COMPLETE    8. Mesenteric ischemia  K55.9 amLODipine  (NORVASC ) 10 MG tablet    PCV ECHOCARDIOGRAM COMPLETE       Problem List Items Addressed This Visit       Cardiovascular and Mediastinum   Essential hypertension - Primary   Relevant Medications   amLODipine  (NORVASC ) 10 MG tablet   Other Relevant Orders   PCV ECHOCARDIOGRAM COMPLETE   Carotid stenosis   Relevant Medications   amLODipine  (NORVASC ) 10 MG tablet   Other Relevant Orders   PCV ECHOCARDIOGRAM COMPLETE   Coronary artery disease due to lipid rich plaque   Relevant Medications   amLODipine  (NORVASC ) 10 MG tablet   Other Relevant Orders   PCV ECHOCARDIOGRAM COMPLETE   Mesenteric ischemia   Relevant Medications  amLODipine  (NORVASC ) 10 MG tablet   Other Relevant Orders   PCV ECHOCARDIOGRAM COMPLETE     Nervous and Auditory   Cigarette nicotine  dependence with nicotine -induced disorder   Relevant  Medications   amLODipine  (NORVASC ) 10 MG tablet   Other Relevant Orders   PCV ECHOCARDIOGRAM COMPLETE     Genitourinary   AKI (acute kidney injury)   Relevant Medications   amLODipine  (NORVASC ) 10 MG tablet   Other Relevant Orders   PCV ECHOCARDIOGRAM COMPLETE     Other   H/O hypercholesterolemia   Relevant Medications   amLODipine  (NORVASC ) 10 MG tablet   Other Relevant Orders   PCV ECHOCARDIOGRAM COMPLETE   Sinus tachycardia   Relevant Medications   amLODipine  (NORVASC ) 10 MG tablet   Other Relevant Orders   PCV ECHOCARDIOGRAM COMPLETE       Disposition:   Return in about 4 weeks (around 08/10/2024) for echo and f/u.    Total time spent: 30 minutes  Signed,  Denyse Bathe, MD  07/13/2024 9:39 AM    [facility]

## 2024-07-21 ENCOUNTER — Ambulatory Visit: Payer: 59 | Admitting: Nurse Practitioner

## 2024-07-21 ENCOUNTER — Other Ambulatory Visit: Payer: Self-pay

## 2024-07-21 VITALS — BP 130/78 | HR 81 | Temp 96.7°F | Resp 16 | Ht 64.0 in | Wt 98.6 lb

## 2024-07-21 DIAGNOSIS — K551 Chronic vascular disorders of intestine: Secondary | ICD-10-CM | POA: Diagnosis not present

## 2024-07-21 DIAGNOSIS — I10 Essential (primary) hypertension: Secondary | ICD-10-CM | POA: Diagnosis not present

## 2024-07-21 DIAGNOSIS — F331 Major depressive disorder, recurrent, moderate: Secondary | ICD-10-CM

## 2024-07-21 DIAGNOSIS — E782 Mixed hyperlipidemia: Secondary | ICD-10-CM | POA: Diagnosis not present

## 2024-07-21 DIAGNOSIS — Z1231 Encounter for screening mammogram for malignant neoplasm of breast: Secondary | ICD-10-CM

## 2024-07-21 DIAGNOSIS — Z0001 Encounter for general adult medical examination with abnormal findings: Secondary | ICD-10-CM | POA: Diagnosis not present

## 2024-07-21 MED ORDER — OXYCODONE HCL 5 MG PO TABS: 5.0000 mg | ORAL_TABLET | Freq: Every day | ORAL | 0 refills | Status: DC | PRN

## 2024-07-21 NOTE — Progress Notes (Signed)
 [facility] [address] [place], [place] [address]  Internal MEDICINE  Office Visit Note  Patient Name: [name]  [id number]  [id number]  Date of Service: 07/21/2024  Chief Complaint  Patient presents with   Depression   Hypertension   Hyperlipidemia   Medicare Wellness    HPI [name] presents for an annual well visit and physical exam.  Well-appearing 68 y.o. female with  Routine CRC screening: due in 2029, currently seeing GI for other issues Routine mammogram: due now  DEXA scan: done in 2022 and was normal Pap smear: discontinued, aged out Labs: due for routine labs  New or worsening pain: chronic pain in legs and abdomen related to vascular issues, seeing [facility] and has upcoming new consult with [facility] vascular  in early December.  Other concerns: Hypertension -- seen by Dr. Waverly Bathe earlier this month, amlodipine  10 mg daily was added to medication regimen and she will have a echocardiogram in December.  Seeing [facility] NP, may need a new referral to get a second opinion.      07/21/2024   10:13 AM 07/16/2023   10:25 AM 05/25/2022   10:25 AM  MMSE - Mini Mental State Exam  Orientation to time 5 5 5   Orientation to Place 5 5 5   Registration 3 3 3   Attention/ Calculation 5 5 5   Recall 3 3 3   Language- name 2 objects 2 2 2   Language- repeat 1 1 1   Language- follow 3 step command 3 3 3   Language- read & follow direction 1 1 1   Write a sentence 0 0 0  Copy design 1 1 1   Total score 29 29 29     Functional Status Survey: Is the patient deaf or have difficulty hearing?: No Does the patient have difficulty seeing, even when wearing glasses/contacts?: No Does the patient have difficulty concentrating, remembering, or making decisions?: No Does the patient have difficulty walking or climbing stairs?: No Does the patient have difficulty dressing or bathing?: No Does the patient have difficulty doing errands alone such as visiting a doctor's  office or shopping?: No     11/23/2022   10:38 AM 05/31/2023   10:25 AM 06/26/2023    1:50 PM 07/16/2023   10:24 AM 07/21/2024   10:12 AM  Fall Risk  Falls in the past year? 0 0 0 0 0  Was there an injury with Fall? 0   0 0  Fall Risk Category Calculator 0   0 0  Patient at Risk for Falls Due to No Fall Risks  No Fall Risks No Fall Risks   Fall risk Follow up Falls evaluation completed Falls evaluation completed Falls evaluation completed Falls evaluation completed Falls evaluation completed       07/16/2023   11:49 AM  Depression screen PHQ 2/9  Decreased Interest 3  Down, Depressed, Hopeless 1  PHQ - 2 Score 4  Altered sleeping 0  Tired, decreased energy 1  Change in appetite 2  Feeling bad or failure about yourself  1  Trouble concentrating 0  Moving slowly or fidgety/restless 0  Suicidal thoughts 0  PHQ-9 Score 8   Difficult doing work/chores Not difficult at all     Data saved with a previous flowsheet row definition        Current Medication: Outpatient Encounter Medications as of 07/21/2024  Medication Sig   albuterol  (VENTOLIN  HFA) 108 (90 Base) MCG/ACT inhaler Inhale 2 puffs into the lungs every 4 (four) hours as  needed for wheezing or shortness of breath.   amLODipine  (NORVASC ) 10 MG tablet Take 1 tablet (10 mg total) by mouth daily.   aspirin  EC 81 MG EC tablet Take 1 tablet (81 mg total) by mouth daily.   budesonide -formoterol  (SYMBICORT ) 80-4.5 MCG/ACT inhaler INHALE 2 PUFFS ONCE DAILY   Cholecalciferol  (VITAMIN D3) 10 MCG (400 UNIT) tablet Take 400 Units by mouth.   clopidogrel  (PLAVIX ) 75 MG tablet Take 75 mg by mouth daily.   cyclobenzaprine  (FLEXERIL ) 5 MG tablet Take 1 tablet (5 mg total) by mouth at bedtime as needed for muscle spasms.   oxyCODONE  (ROXICODONE ) 5 MG immediate release tablet Take 1 tablet (5 mg total) by mouth daily as needed for severe pain (pain score 7-10).   [DISCONTINUED] oxyCODONE  (ROXICODONE ) 5 MG immediate release tablet Take  1 tablet (5 mg total) by mouth daily as needed for severe pain (pain score 7-10).   No facility-administered encounter medications on file as of 07/21/2024.    Surgical History: Past Surgical History:  Procedure Laterality Date   ABDOMINAL HYSTERECTOMY     APPENDECTOMY     CARDIAC CATHETERIZATION     CARDIAC SURGERY     CHOLECYSTECTOMY     COLONOSCOPY  12/15/2011   OH->bleeding internal hemorrhoids, otherwise normal   COLONOSCOPY WITH PROPOFOL  N/A 04/12/2015   Procedure: COLONOSCOPY WITH PROPOFOL ;  Surgeon: Rogelia Copping, MD;  Location: [facility];  Service: Endoscopy;  Laterality: N/A;   COLONOSCOPY WITH PROPOFOL  N/A 01/05/2020   Procedure: COLONOSCOPY WITH PROPOFOL ;  Surgeon: Copping Rogelia, MD;  Location: [facility];  Service: Endoscopy;  Laterality: N/A;   ENDARTERECTOMY FEMORAL Left 04/26/2015   Procedure: ENDARTERECTOMY FEMORAL;  Surgeon: Selinda GORMAN Gu, MD;  Location: [facility] ORS;  Service: Vascular;  Laterality: Left;   ENDOVASCULAR STENT INSERTION Bilateral    Legs   FLEXIBLE BRONCHOSCOPY N/A 02/02/2015   Procedure: FLEXIBLE BRONCHOSCOPY;  Surgeon: Elfreda DELENA Bathe, MD;  Location: [facility] ORS;  Service: Pulmonary;  Laterality: N/A;   HEMORRHOID SURGERY     lung mass removed     PERIPHERAL VASCULAR CATHETERIZATION Left 04/25/2015   Procedure: Lower Extremity Angiography;  Surgeon: Selinda GORMAN Gu, MD;  Location: [facility];  Service: Cardiovascular;  Laterality: Left;   PERIPHERAL VASCULAR CATHETERIZATION Left 04/25/2015   Procedure: Lower Extremity Intervention;  Surgeon: Selinda GORMAN Gu, MD;  Location: [facility];  Service: Cardiovascular;  Laterality: Left;   PERIPHERAL VASCULAR CATHETERIZATION Left 01/02/2016   Procedure: Lower Extremity Angiography;  Surgeon: Selinda GORMAN Gu, MD;  Location: [facility];  Service: Cardiovascular;  Laterality: Left;   PERIPHERAL VASCULAR CATHETERIZATION  01/02/2016   Procedure: Lower Extremity Intervention;  Surgeon: Selinda GORMAN Gu,  MD;  Location: [facility];  Service: Cardiovascular;;   THROMBECTOMY FEMORAL ARTERY Left 04/26/2015   Procedure: THROMBECTOMY FEMORAL ARTERY;  Surgeon: Selinda GORMAN Gu, MD;  Location: [facility];  Service: Vascular;  Laterality: Left;   VENTRAL HERNIA REPAIR N/A 07/25/2023   Procedure: HERNIA REPAIR VENTRAL ADULT, open;  Surgeon: Desiderio Schanz, MD;  Location: [facility] OR[facility];  Service: General;  Laterality: N/A;   VISCERAL ANGIOGRAPHY N/A 04/06/2024   Procedure: VISCERAL ANGIOGRAPHY;  Surgeon: Gu Selinda GORMAN, MD;  Location: [facility];  Service: Cardiovascular;  Laterality: N/A;   VISCERAL ANGIOGRAPHY N/A 05/21/2024   Procedure: VISCERAL ANGIOGRAPHY;  Surgeon: Gu Selinda GORMAN, MD;  Location: [facility];  Service: Cardiovascular;  Laterality: N/A;    Medical History: Past Medical History:  Diagnosis Date   Adenocarcinoma of upper lobe of right lung (HCC)    a.) s/p posterior [facility]/20/23 [facility]   Anemia    Arthritis    Asthma    Carotid artery stenosis    a.) doppler 05/14/2017: 1-39% BICA; b.) doppler 04/05/2018: 50-69% BICA; c.) doppler 06/12/2019: 40-59% BICA; d.) doppler 06/14/2020, 06/14/2021: 1-39% BICA; e.) doppler 06/19/2022: 1.39% RICA, 40-59% LICA   Colon polyp    COPD (chronic obstructive pulmonary disease) (HCC)    Coronary artery disease 04/03/2006   a.) LHC 04/03/2006: 25% mLM, 25% mLAD, 50% D1, 25/50% mLCx, 50% dLCx, 25% OM2, 100% pRCA --> med mgmt; b.) LHC 04/25/2010: 25% pLM, 25% pLAD, 50% pLCx, 70% dLCx, 100% pRCA --> med mgmt   Depression    Diastolic dysfunction    a.) TTE 04/05/2018: EF 65%, no RWMAs, G1DD, triv MR/TR; b.) TTE 10/15/2022: EF 88%, mod LVH, G1DD, triv TR, mild MR; c.) TTE 02/27/2023: EF 65%, no RWMAs, triv TR/MR, RVSP 35; d.) TTE 03/21/2023: EF 65%, no RWMAs, norm RVSF, PASP 44   Environmental allergies    GI bleed 03/24/2023   Headache    Hyperlipidemia    Hypertension    Hypoxic respiratory failure (HCC)    Internal  hemorrhoids    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[facility])    a.) thinks it was in 2007 --> LHC 04/03/2006 revealed a 100% pRCA --> medically managed   Necrotizing pneumonia (HCC)    Pulmonary HTN (HCC)    a.) TTE 02/27/2023: RVSP 35 mmHg; b.) TTE 03/21/2023: PASP 44 mmHg   PVD (peripheral vascular disease) with claudication    Shortness of breath dyspnea    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    Substance abuse (HCC)    Substance induced mood disorder (HCC)    Suicidal ideation    Thrombocytopenia    Tobacco abuse    Unstable angina (HCC)    Ventral hernia    Vitamin D  deficiency     Family History: Family History  Problem Relation Age of Onset   Hypertension Mother    Hypertension Father     Social History   Socioeconomic History   Marital status: Single    Spouse name: Not on file   Number of children: Not on file   Years of education: Not on file   Highest education level: Not on file  Occupational History   Not on file  Tobacco Use   Smoking status: Every Day    Current packs/day: 1.00    Types: Cigarettes    Passive exposure: Past   Smokeless tobacco: Never   Tobacco comments:    1 PPD  Vaping Use   Vaping status: Never Used  Substance and Sexual Activity   Alcohol use: Not Currently    Comment: weekends, couple beers, pint of liquer only on weekends.  Last drank approx 1 month ago.   Drug use: No   Sexual activity: Not Currently  Other Topics Concern   Not on file  Social History Narrative   Lives with family    Social Drivers of Health   Financial Resource Strain: Low Risk (03/21/2023)   [facility]   Overall Financial Resource Strain (CARDIA)    Difficulty of Paying Living Expenses: Not hard at all  Food Insecurity: No Food Insecurity (04/13/2024)   Hunger Vital Sign    Worried About Running Out of Food in the Last Year: Never true    Ran Out of  Food in the Last Year: Never true  Transportation Needs: No Transportation Needs  (04/13/2024)   PRAPARE - Administrator, Civil Service (Medical): No    Lack of Transportation (Non-Medical): No  Physical Activity: Inactive (04/04/2018)   Exercise Vital Sign    Days of Exercise per Week: 0 days    Minutes of Exercise per Session: 0 min  Stress: No Stress Concern Present (04/04/2018)   [employer] of Occupational Health - Occupational Stress Questionnaire    Feeling of Stress : Only a little  Social Connections: Socially Isolated (04/06/2024)   Social Connection and Isolation Panel    Frequency of Communication with Friends and Family: Once a week    Frequency of Social Gatherings with Friends and Family: Once a week    Attends Religious Services: 1 to 4 times per year    Active Member of [employer] or Organizations: No    Attends Banker Meetings: Never    Marital Status: Never married  Intimate Partner Violence: Not At Risk (04/13/2024)   Humiliation, Afraid, Rape, and Kick questionnaire    Fear of Current or Ex-Partner: No    Emotionally Abused: No    Physically Abused: No    Sexually Abused: No      Review of Systems  Constitutional:  Negative for activity change, appetite change, chills, fatigue, fever and unexpected weight change.  HENT: Negative.  Negative for congestion, ear pain, rhinorrhea, sore throat and trouble swallowing.   Eyes: Negative.   Respiratory: Negative.  Negative for cough, chest tightness, shortness of breath and wheezing.   Cardiovascular: Negative.  Negative for chest pain.  Gastrointestinal: Negative.  Negative for abdominal pain, blood in stool, constipation, diarrhea, nausea and vomiting.  Endocrine: Negative.   Genitourinary: Negative.  Negative for difficulty urinating, dysuria, frequency, hematuria and urgency.  Musculoskeletal: Negative.  Negative for arthralgias, back pain, joint swelling, myalgias and neck pain.  Skin: Negative.  Negative for rash and wound.  Allergic/Immunologic: Negative.  Negative for  immunocompromised state.  Neurological: Negative.  Negative for dizziness, seizures, numbness and headaches.  Hematological: Negative.   Psychiatric/Behavioral: Negative.  Negative for behavioral problems, self-injury and suicidal ideas. The patient is not nervous/anxious.     Vital Signs: BP 130/78 (BP Location: Right Arm, Patient Position: Sitting) Comment: 150/80  Pulse 81   Temp (!) 96.7 F (35.9 C)   Resp 16   Ht 5' 4 (1.626 m)   Wt 98 lb 9.6 oz (44.7 kg)   LMP  (LMP Unknown)   SpO2 96%   BMI 16.92 kg/m    Physical Exam Vitals reviewed.  Constitutional:      General: She is awake. She is not in acute distress.    Appearance: Normal appearance. She is well-developed, well-groomed and normal weight. She is not ill-appearing or diaphoretic.  HENT:     Head: Normocephalic and atraumatic.     Right Ear: Tympanic membrane, ear canal and external ear normal.     Left Ear: Tympanic membrane, ear canal and external ear normal.     Nose: Nose normal. No congestion or rhinorrhea.     Mouth/Throat:     Mouth: Mucous membranes are moist.     Pharynx: Oropharynx is clear. No oropharyngeal exudate or posterior oropharyngeal erythema.  Eyes:     General: Lids are normal. Vision grossly intact. Gaze aligned appropriately. No scleral icterus.       Right eye: No discharge.  Left eye: No discharge.     Conjunctiva/sclera: Conjunctivae normal.     Pupils: Pupils are equal, round, and reactive to light.     Funduscopic exam:    Right eye: Red reflex present.        Left eye: Red reflex present. Neck:     Thyroid : No thyromegaly.     Vascular: No JVD.     Trachea: No tracheal deviation.  Cardiovascular:     Rate and Rhythm: Normal rate and regular rhythm.     Pulses: Normal pulses.     Heart sounds: Normal heart sounds, S1 normal and S2 normal. No murmur heard.    No friction rub. No gallop.  Pulmonary:     Effort: Pulmonary effort is normal. No accessory muscle usage or  respiratory distress.     Breath sounds: Normal breath sounds and air entry. No stridor. No wheezing or rales.  Chest:     Chest wall: No tenderness.  Breasts:    Breasts are symmetrical.     Right: Normal. No swelling, bleeding, inverted nipple, mass, nipple discharge, skin change or tenderness.     Left: Normal. No swelling, bleeding, inverted nipple, mass, nipple discharge, skin change or tenderness.  Abdominal:     General: Bowel sounds are normal. There is no distension.     Palpations: Abdomen is soft. There is no shifting dullness, fluid wave, mass or pulsatile mass.     Tenderness: There is no abdominal tenderness. There is no guarding or rebound.  Musculoskeletal:        General: No tenderness or deformity. Normal range of motion.     Cervical back: Normal range of motion and neck supple.     Right lower leg: No edema.     Left lower leg: No edema.  Lymphadenopathy:     Cervical: No cervical adenopathy.     Upper Body:     Right upper body: No supraclavicular, axillary or pectoral adenopathy.     Left upper body: No supraclavicular, axillary or pectoral adenopathy.  Skin:    General: Skin is warm and dry.     Capillary Refill: Capillary refill takes less than 2 seconds.     Coloration: Skin is not pale.     Findings: No erythema or rash.  Neurological:     Mental Status: She is alert and oriented to person, place, and time.     Cranial Nerves: No cranial nerve deficit.     Motor: No abnormal muscle tone.     Coordination: Coordination normal.     Deep Tendon Reflexes: Reflexes are normal and symmetric.  Psychiatric:        Mood and Affect: Mood normal.        Behavior: Behavior normal. Behavior is cooperative.        Thought Content: Thought content normal.        Judgment: Judgment normal.        Assessment/Plan: 1. Encounter for Medicare annual examination with abnormal findings (Primary) Age-appropriate preventive screenings and vaccinations discussed.  Routine labs for health maintenance ordered, see below. PHM updated.    2. Hypercalcemia Additional labs ordered  - Ca+Creat+P+PTH Intact - Iron, TIBC and Ferritin Panel - CBC with Differential/Platelet  3. Essential hypertension Stable, continue medications as prescribed and follow up with cardiology   4. Mixed hyperlipidemia Routine labs ordered  - Lipid Profile  5. Iron overload Routine labs ordered  - Iron, TIBC and Ferritin Panel - CBC with Differential/Platelet  6. Stenosis of inferior mesenteric artery Prn oxycodone  prescribed, take as ordered  - oxyCODONE  (ROXICODONE ) 5 MG immediate release tablet; Take 1 tablet (5 mg total) by mouth daily as needed for severe pain (pain score 7-10).  Dispense: 30 tablet; Refill: 0  7. Encounter for screening mammogram for malignant neoplasm of breast Routine mammogram ordered  - MM 3D SCREENING MAMMOGRAM BILATERAL BREAST; Future  8. Moderate episode of recurrent major depressive disorder (HCC) Stable, and doing well. Not currently on any antidepressants.     General Counseling: Bob verbalizes understanding of the findings of todays visit and agrees with plan of treatment. I have discussed any further diagnostic evaluation that may be needed or ordered today. We also reviewed her medications today. she has been encouraged to call the office with any questions or concerns that should arise related to todays visit.    Orders Placed This Encounter  Procedures   MM 3D SCREENING MAMMOGRAM BILATERAL BREAST   Ca+Creat+P+PTH Intact   Iron, TIBC and Ferritin Panel   CBC with Differential/Platelet   Lipid Profile    Meds ordered this encounter  Medications   oxyCODONE  (ROXICODONE ) 5 MG immediate release tablet    Sig: Take 1 tablet (5 mg total) by mouth daily as needed for severe pain (pain score 7-10).    Dispense:  30 tablet    Refill:  0    For refills    Return in about 3 weeks (around 08/11/2024) for F/U, Labs, Na Waldrip  PCP.   Total time spent:30 Minutes Time spent includes review of chart, medications, test results, and follow up plan with the patient.   [place] Controlled Substance Database was reviewed by me.  This patient was seen by Mardy Maxin, FNP-C in collaboration with Dr. Sigrid Bathe as a part of collaborative care agreement.  Kelan Pritt R. Maxin, MSN, FNP-C Internal medicine

## 2024-07-22 ENCOUNTER — Telehealth: Payer: Self-pay | Admitting: Nurse Practitioner

## 2024-07-22 NOTE — Telephone Encounter (Signed)
 Notified patient of mammo appointment date, arrival time, location -Sheralyn Boatman

## 2024-07-22 NOTE — Progress Notes (Unsigned)
 07/23/2024 [name] [id number] 1955/10/30  Gastroenterology Office Note    Referring Provider: Delores Orvin BRAVO, NP Primary Care Physician:  Liana Fish, NP  Primary GI Provider: Jinny Carmine, MD    Chief Complaint   Chief Complaint  Patient presents with   New Patient (Initial Visit)    Since August-abd pain/constipation/ diarrhea- not taking any fiber supplements-last BM yesterday loose stool.     History of Present Illness   [name] is a 68 y.o. female with PMHX of mesenteric ischemia (s/p PCI for SMA occlusion on 04/06/24), incarcerated incisional epigastric hernia repair 07/25/2023, CAD c/b MI, HTN, HLD, PVD, right upper lobe adenocarcinoma, presenting today at the request of Delores Orvin BRAVO, NP due to ongoing abdominal pain.   Patient was seen at [facility] emergency department on 07/07/2024 for abdominal pain, nausea, emesis, intermittent diarrhea mixed with BRB. Reassuring lactate, CBC, lipase, PT-INR, troponin, and CMP.   Today patient reports she has been having ongoing abdominal pain for a long time. She takes oxycodone  may every other day for periumbilical pain.  She had stent placement to celiac artery in August and follows with [facility].  She recently went to the emergency room for abdominal pain and diarrhea.  She states that since August she has been alternating between diarrhea and constipation.  Reports last bowel movement was yesterday and it was loose.  Some days she will feel like she has to go and then it will be several days that she does not have a bowel movement at all, and then when she does have a bowel movement it will just be like water. Patient denies rectal bleeding currently, reports she had 1 episode of small amount of BRB when she wiped prior to emergency room visit. Denies nausea, vomiting, melena. She reports she has a good appetite but eats a lot of soup due to missing teeth. She drinks 2-3 bottles of water daily. She  endorses taking oxycodone  may every other day for abdominal pain.   She has appointment with [facility] vascular surgery on 08/19/2024.   Patient was seen at [facility] emergency department on 07/07/2024 for abdominal pain, nausea, emesis, intermittent diarrhea mixed with BRB. Reassuring lactate, CBC, lipase, PT-INR, troponin, and CMP.   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 The celiac artery is stented and patent but diseased distally.   Severe luminal narrowing of the takeoff of the left greater than right renal arteries without visible parenchymal changes on arterial or venous phase. Additional more inferior left accessory renal artery without significant luminal narrowing.   Additional underlying severe calcified and noncalcified atherosclerotic disease of the abdominal aorta, mesenteric, renal, and iliac arteries, which remain normal caliber and patent.   Sequela of left endarterectomy with partially imaged luminal narrowing of the superficial femoral artery. Consider further evaluation with dedicated left lower extremity CTA runoff if clinically indicated.   Normal enhancing bowel without pneumatosis or abnormal bowel wall thickening or additional evidence of bowel ischemia.   Multiple solid pulmonary nodules measuring up to 0.5 cm. Further evaluation could be obtained with dedicated chest CT in 12 months if clinically indicated per Fleischner criteria.   There is no evidence of bowel pneumatosis to indicate ischemic bowel.   01/05/2020 colonoscopy - One 7 mm polyp in the sigmoid colon, removed with a hot snare. Resected and retrieved.  - One 3 mm polyp in the cecum, removed with a cold  biopsy forceps. Resected and retrieved.  - Non-bleeding internal hemorrhoids - repeat in 5 years for surveillance  Past Medical History:  Diagnosis Date   Adenocarcinoma of upper  lobe of right lung (HCC)    a.) s/p posterior segmentectomy on 03/20/23 at [facility]   Anemia    Arthritis    Asthma    Carotid artery stenosis    a.) doppler 05/14/2017: 1-39% BICA; b.) doppler 04/05/2018: 50-69% BICA; c.) doppler 06/12/2019: 40-59% BICA; d.) doppler 06/14/2020, 06/14/2021: 1-39% BICA; e.) doppler 06/19/2022: 1.39% RICA, 40-59% LICA   Colon polyp    COPD (chronic obstructive pulmonary disease) (HCC)    Coronary artery disease 04/03/2006   a.) LHC 04/03/2006: 25% mLM, 25% mLAD, 50% D1, 25/50% mLCx, 50% dLCx, 25% OM2, 100% pRCA --> med mgmt; b.) LHC 04/25/2010: 25% pLM, 25% pLAD, 50% pLCx, 70% dLCx, 100% pRCA --> med mgmt   Depression    Diastolic dysfunction    a.) TTE 04/05/2018: EF 65%, no RWMAs, G1DD, triv MR/TR; b.) TTE 10/15/2022: EF 88%, mod LVH, G1DD, triv TR, mild MR; c.) TTE 02/27/2023: EF 65%, no RWMAs, triv TR/MR, RVSP 35; d.) TTE 03/21/2023: EF 65%, no RWMAs, norm RVSF, PASP 44   Environmental allergies    GI bleed 03/24/2023   Headache    Hyperlipidemia    Hypertension    Hypoxic respiratory failure (HCC)    Internal hemorrhoids    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[facility])    a.) thinks it was in 2007 --> LHC 04/03/2006 revealed a 100% pRCA --> medically managed   Necrotizing pneumonia (HCC)    Pulmonary HTN (HCC)    a.) TTE 02/27/2023: RVSP 35 mmHg; b.) TTE 03/21/2023: PASP 44 mmHg   PVD (peripheral vascular disease) with claudication    Shortness of breath dyspnea    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    Substance abuse (HCC)    Substance induced mood disorder (HCC)    Suicidal ideation    Thrombocytopenia    Tobacco abuse    Unstable angina (HCC)    Ventral hernia    Vitamin D  deficiency     Past Surgical History:  Procedure Laterality Date   ABDOMINAL HYSTERECTOMY     APPENDECTOMY     CARDIAC CATHETERIZATION     CARDIAC SURGERY     CHOLECYSTECTOMY     COLONOSCOPY  12/15/2011   OH->bleeding internal hemorrhoids, otherwise normal   COLONOSCOPY  WITH PROPOFOL  N/A 04/12/2015   Procedure: COLONOSCOPY WITH PROPOFOL ;  Surgeon: Rogelia Copping, MD;  Location: [facility] ENDOSCOPY;  Service: Endoscopy;  Laterality: N/A;   COLONOSCOPY WITH PROPOFOL  N/A 01/05/2020   Procedure: COLONOSCOPY WITH PROPOFOL ;  Surgeon: Copping Rogelia, MD;  Location: [facility] ENDOSCOPY;  Service: Endoscopy;  Laterality: N/A;   ENDARTERECTOMY FEMORAL Left 04/26/2015   Procedure: ENDARTERECTOMY FEMORAL;  Surgeon: Selinda GORMAN Gu, MD;  Location: [facility] ORS;  Service: Vascular;  Laterality: Left;   ENDOVASCULAR STENT INSERTION Bilateral    Legs   FLEXIBLE BRONCHOSCOPY N/A 02/02/2015   Procedure: FLEXIBLE BRONCHOSCOPY;  Surgeon: Elfreda DELENA Bathe, MD;  Location: [facility] ORS;  Service: Pulmonary;  Laterality: N/A;   HEMORRHOID SURGERY     lung mass removed     PERIPHERAL VASCULAR CATHETERIZATION Left 04/25/2015   Procedure: Lower Extremity Angiography;  Surgeon: Selinda GORMAN Gu, MD;  Location: [facility] INVASIVE CV LAB;  Service: Cardiovascular;  Laterality: Left;   PERIPHERAL VASCULAR CATHETERIZATION Left 04/25/2015   Procedure: Lower Extremity Intervention;  Surgeon: Selinda GORMAN Gu, MD;  Location: [facility] INVASIVE  CV LAB;  Service: Cardiovascular;  Laterality: Left;   PERIPHERAL VASCULAR CATHETERIZATION Left 01/02/2016   Procedure: Lower Extremity Angiography;  Surgeon: Selinda GORMAN Gu, MD;  Location: [facility] INVASIVE CV LAB;  Service: Cardiovascular;  Laterality: Left;   PERIPHERAL VASCULAR CATHETERIZATION  01/02/2016   Procedure: Lower Extremity Intervention;  Surgeon: Selinda GORMAN Gu, MD;  Location: [facility] INVASIVE CV LAB;  Service: Cardiovascular;;   THROMBECTOMY FEMORAL ARTERY Left 04/26/2015   Procedure: THROMBECTOMY FEMORAL ARTERY;  Surgeon: Selinda GORMAN Gu, MD;  Location: [facility] ORS;  Service: Vascular;  Laterality: Left;   VENTRAL HERNIA REPAIR N/A 07/25/2023   Procedure: HERNIA REPAIR VENTRAL ADULT, open;  Surgeon: Desiderio Schanz, MD;  Location: [facility] ORS;  Service: General;  Laterality: N/A;   VISCERAL ANGIOGRAPHY N/A  04/06/2024   Procedure: VISCERAL ANGIOGRAPHY;  Surgeon: Gu Selinda GORMAN, MD;  Location: [facility] INVASIVE CV LAB;  Service: Cardiovascular;  Laterality: N/A;   VISCERAL ANGIOGRAPHY N/A 05/21/2024   Procedure: VISCERAL ANGIOGRAPHY;  Surgeon: Gu Selinda GORMAN, MD;  Location: [facility] INVASIVE CV LAB;  Service: Cardiovascular;  Laterality: N/A;    Current Outpatient Medications  Medication Sig Dispense Refill   albuterol  (VENTOLIN  HFA) 108 (90 Base) MCG/ACT inhaler Inhale 2 puffs into the lungs every 4 (four) hours as needed for wheezing or shortness of breath. 9 g 1   amLODipine  (NORVASC ) 10 MG tablet Take 1 tablet (10 mg total) by mouth daily. 30 tablet 11   oxyCODONE  (ROXICODONE ) 5 MG immediate release tablet Take 1 tablet (5 mg total) by mouth daily as needed for severe pain (pain score 7-10). 30 tablet 0   budesonide -formoterol  (SYMBICORT ) 80-4.5 MCG/ACT inhaler INHALE 2 PUFFS ONCE DAILY (Patient not taking: Reported on 07/23/2024) 22 g 2   Cholecalciferol  (VITAMIN D3) 10 MCG (400 UNIT) tablet Take 400 Units by mouth. (Patient not taking: Reported on 07/23/2024)     clopidogrel  (PLAVIX ) 75 MG tablet Take 75 mg by mouth daily. (Patient not taking: Reported on 07/23/2024)     cyclobenzaprine  (FLEXERIL ) 5 MG tablet Take 1 tablet (5 mg total) by mouth at bedtime as needed for muscle spasms. (Patient not taking: Reported on 07/23/2024) 30 tablet 3   No current facility-administered medications for this visit.    Allergies as of 07/23/2024 - Review Complete 07/23/2024  Allergen Reaction Noted   Tylenol  [acetaminophen ] Anaphylaxis 04/07/2024   Aspirin  Nausea And Vomiting and Other (See Comments) 01/22/2015   Zyrtec [cetirizine] Other (See Comments) 09/06/2016   Tramadol Rash 11/11/2017    Family History  Problem Relation Age of Onset   Hypertension Mother    Hypertension Father     Social History   Socioeconomic History   Marital status: Single    Spouse name: Not on file   Number of children: Not on  file   Years of education: Not on file   Highest education level: Not on file  Occupational History   Not on file  Tobacco Use   Smoking status: Every Day    Current packs/day: 1.00    Types: Cigarettes    Passive exposure: Past   Smokeless tobacco: Never   Tobacco comments:    1 PPD  Vaping Use   Vaping status: Never Used  Substance and Sexual Activity   Alcohol use: Not Currently    Comment: weekends, couple beers, pint of liquer only on weekends.  Last drank approx 1 month ago.   Drug use: No   Sexual activity: Not Currently  Other Topics Concern   Not on  file  Social History Narrative   Lives with family    Social Drivers of Health   Financial Resource Strain: Low Risk (03/21/2023)   Received from [facility]   Overall Financial Resource Strain (CARDIA)    Difficulty of Paying Living Expenses: Not hard at all  Food Insecurity: No Food Insecurity (04/13/2024)   Hunger Vital Sign    Worried About Running Out of Food in the Last Year: Never true    Ran Out of Food in the Last Year: Never true  Transportation Needs: No Transportation Needs (04/13/2024)   PRAPARE - Administrator, Civil Service (Medical): No    Lack of Transportation (Non-Medical): No  Physical Activity: Inactive (04/04/2018)   Exercise Vital Sign    Days of Exercise per Week: 0 days    Minutes of Exercise per Session: 0 min  Stress: No Stress Concern Present (04/04/2018)   [employer] of Occupational Health - Occupational Stress Questionnaire    Feeling of Stress : Only a little  Social Connections: Socially Isolated (04/06/2024)   Social Connection and Isolation Panel    Frequency of Communication with Friends and Family: Once a week    Frequency of Social Gatherings with Friends and Family: Once a week    Attends Religious Services: 1 to 4 times per year    Active Member of Clubs or Organizations: No    Attends Banker Meetings: Never    Marital Status: Never married   Intimate Partner Violence: Not At Risk (04/13/2024)   Humiliation, Afraid, Rape, and Kick questionnaire    Fear of Current or Ex-Partner: No    Emotionally Abused: No    Physically Abused: No    Sexually Abused: No     RELEVANT GI HISTORY, IMAGING AND LABS: CBC    Component Value Date/Time   WBC 5.2 04/24/2024 1330   WBC 4.6 04/09/2024 0435   RBC 3.43 (L) 04/24/2024 1330   RBC 2.82 (L) 04/09/2024 0435   HGB 10.9 (L) 04/24/2024 1330   HCT 34.1 04/24/2024 1330   PLT 318 04/24/2024 1330   MCV 99 (H) 04/24/2024 1330   MCV 88 10/15/2013 1123   MCH 31.8 04/24/2024 1330   MCH 32.6 04/09/2024 0435   MCHC 32.0 04/24/2024 1330   MCHC 33.6 04/09/2024 0435   RDW 13.0 04/24/2024 1330   RDW 14.9 (H) 10/15/2013 1123   LYMPHSABS 1.3 04/24/2024 1330   LYMPHSABS 0.9 (L) 10/15/2013 1123   MONOABS 0.3 05/05/2023 1748   MONOABS 0.4 10/15/2013 1123   EOSABS 0.1 04/24/2024 1330   EOSABS 0.0 10/15/2013 1123   BASOSABS 0.0 04/24/2024 1330   BASOSABS 0.0 10/15/2013 1123   Recent Labs    01/22/24 2116 03/28/24 2204 04/03/24 1202 04/05/24 2053 04/06/24 1201 04/06/24 2233 04/07/24 0602 04/08/24 0427 04/09/24 0435 04/24/24 1330  HGB 11.9* 11.6* 11.9* 11.2* 8.9* 9.2* 8.6* 8.7* 9.2* 10.9*    CMP     Component Value Date/Time   NA 139 04/24/2024 1330   NA 131 (L) 10/15/2013 1123   K 4.4 04/24/2024 1330   K 3.6 10/15/2013 1123   CL 103 04/24/2024 1330   CL 102 10/15/2013 1123   CO2 20 04/24/2024 1330   CO2 24 10/15/2013 1123   GLUCOSE 93 04/24/2024 1330   GLUCOSE 112 (H) 04/10/2024 0435   GLUCOSE 111 (H) 10/15/2013 1123   BUN 11 05/21/2024 1015   BUN 9 04/24/2024 1330   BUN 12 10/15/2013 1123   CREATININE  0.77 05/21/2024 1015   CREATININE 1.20 10/15/2013 1123   CALCIUM  10.7 (H) 04/24/2024 1330   CALCIUM  9.1 10/15/2013 1123   PROT 7.4 04/24/2024 1330   ALBUMIN 4.4 04/24/2024 1330   AST 16 04/24/2024 1330   ALT 11 04/24/2024 1330   ALKPHOS 80 04/24/2024 1330   BILITOT 0.3  04/24/2024 1330   GFRNONAA >60 05/21/2024 1015   GFRNONAA 50 (L) 10/15/2013 1123   GFRAA >60 03/02/2020 1232   GFRAA 58 (L) 10/15/2013 1123      Latest Ref Rng & Units 04/24/2024    1:30 PM 04/05/2024    8:53 PM 04/03/2024   12:02 PM  Hepatic Function  Total Protein 6.0 - 8.5 g/dL 7.4  7.8  7.3   Albumin 3.9 - 4.9 g/dL 4.4  4.2  4.0   AST 0 - 40 IU/L 16  23  22    ALT 0 - 32 IU/L 11  16  14    Alk Phosphatase 44 - 121 IU/L 80  61  50   Total Bilirubin 0.0 - 1.2 mg/dL 0.3  0.4  0.7       Review of Systems   All systems reviewed and negative except where noted in HPI.    Physical Exam  LMP  (LMP Unknown)  No LMP recorded (lmp unknown). Patient has had a hysterectomy. General:   Alert and oriented. Pleasant and cooperative. Well-nourished and well-developed. In no acute distress.  Head:  Normocephalic and atraumatic. Eyes:  Without icterus Ears:  Normal auditory acuity. Lungs:  Respirations even and unlabored.  Clear throughout to auscultation.   No wheezes, crackles, or rhonchi. No acute distress. Heart:  Regular rate and rhythm; no murmurs, clicks, rubs, or gallops. Abdomen:  Normal bowel sounds.  No bruits.  Mild TTP periumbilical, soft, and non-distended without masses, hepatosplenomegaly or hernias noted.  No guarding or rebound tenderness.    Rectal:  Deferred. Msk:  Symmetrical without gross deformities. Normal posture. Extremities:  Without edema. Neurologic:  Alert and  oriented x4;  grossly normal neurologically. Skin:  Intact without significant lesions or rashes. Psych:  Alert and cooperative. Normal mood and affect.   Assessment & Plan   Ayven Glasco is a 68 y.o. female presenting today with ongoing abdominal pain, alternating diarrhea and constipation.   Diarrhea and alternating constipation.  - discussed opioid use and constipation.  - Recommend High Fiber diet with fruits, vegetables, and whole grains. - Drink 64 ounces of Fluids Daily. - Start Miralax  Mix  1 capful in a drink daily. - Start Benefiber Mix 1 TBSP in a drink daily. - can order Abd xray to check for stool burden if no improvement with miralax  and fiber.  - If no improvement, consider Linzess, Amitiza or Trulance.  Mesenteric ischemia. Continues to have intermittent abdominal pain. Was seen by [facility] vascular surgery on 07/07/2024: no evidence of acute on chronic mesenteric ischemia. No acute intervention needed by vascular surgery.  - patient will keep scheduled appointment with [facility] vascular on 08/19/2024. -discussed importance of smoking cessation  Follow up in 2 months  Grayce Bohr, DNP, AGNP-C [facility]

## 2024-07-23 ENCOUNTER — Ambulatory Visit (INDEPENDENT_AMBULATORY_CARE_PROVIDER_SITE_OTHER): Admitting: Family Medicine

## 2024-07-23 ENCOUNTER — Encounter: Payer: Self-pay | Admitting: Family Medicine

## 2024-07-23 VITALS — BP 133/78 | HR 89 | Temp 97.9°F | Ht 64.0 in | Wt 98.0 lb

## 2024-07-23 DIAGNOSIS — K5909 Other constipation: Secondary | ICD-10-CM

## 2024-07-23 DIAGNOSIS — R197 Diarrhea, unspecified: Secondary | ICD-10-CM | POA: Diagnosis not present

## 2024-07-23 DIAGNOSIS — K559 Vascular disorder of intestine, unspecified: Secondary | ICD-10-CM

## 2024-07-29 LAB — LIPID PANEL
Chol/HDL Ratio: 4.3 ratio (ref 0.0–4.4)
Cholesterol, Total: 200 mg/dL — ABNORMAL HIGH (ref 100–199)
HDL: 47 mg/dL (ref >39)
LDL Chol Calc (NIH): 133 mg/dL — ABNORMAL HIGH (ref 0–99)
Triglycerides: 113 mg/dL (ref 0–149)
VLDL Cholesterol Cal: 20 mg/dL (ref 5–40)

## 2024-07-29 LAB — CBC WITH DIFFERENTIAL/PLATELET
Basophils Absolute: 0 x10E3/uL (ref 0.0–0.2)
Basos: 1 %
EOS (ABSOLUTE): 0.1 x10E3/uL (ref 0.0–0.4)
Eos: 1 %
Hematocrit: 37.3 % (ref 34.0–46.6)
Hemoglobin: 11.5 g/dL (ref 11.1–15.9)
Immature Grans (Abs): 0 x10E3/uL (ref 0.0–0.1)
Immature Granulocytes: 0 %
Lymphocytes Absolute: 1.4 x10E3/uL (ref 0.7–3.1)
Lymphs: 33 %
MCH: 27 pg (ref 26.6–33.0)
MCHC: 30.8 g/dL — ABNORMAL LOW (ref 31.5–35.7)
MCV: 88 fL (ref 79–97)
Monocytes Absolute: 0.3 x10E3/uL (ref 0.1–0.9)
Monocytes: 8 %
Neutrophils Absolute: 2.5 x10E3/uL (ref 1.4–7.0)
Neutrophils: 57 %
Platelets: 251 x10E3/uL (ref 150–450)
RBC: 4.26 x10E6/uL (ref 3.77–5.28)
RDW: 13.6 % (ref 11.7–15.4)
WBC: 4.3 x10E3/uL (ref 3.4–10.8)

## 2024-07-29 LAB — IRON,TIBC AND FERRITIN PANEL
Ferritin: 21 ng/mL (ref 15–150)
Iron Saturation: 7 % — CL (ref 15–55)
Iron: 34 ug/dL (ref 27–139)
Total Iron Binding Capacity: 494 ug/dL — ABNORMAL HIGH (ref 250–450)
UIBC: 460 ug/dL — ABNORMAL HIGH (ref 118–369)

## 2024-07-29 LAB — CA+CREAT+P+PTH INTACT
Calcium: 10.3 mg/dL (ref 8.7–10.3)
Creatinine, Ser: 0.72 mg/dL (ref 0.57–1.00)
PTH: 49 pg/mL (ref 15–65)
Phosphorus: 4.1 mg/dL (ref 3.0–4.3)
eGFR: 91 mL/min/1.73 (ref >59)

## 2024-08-07 ENCOUNTER — Ambulatory Visit

## 2024-08-07 DIAGNOSIS — I6523 Occlusion and stenosis of bilateral carotid arteries: Secondary | ICD-10-CM

## 2024-08-07 DIAGNOSIS — I340 Nonrheumatic mitral (valve) insufficiency: Secondary | ICD-10-CM | POA: Diagnosis not present

## 2024-08-07 DIAGNOSIS — R000 Tachycardia, unspecified: Secondary | ICD-10-CM

## 2024-08-07 DIAGNOSIS — I361 Nonrheumatic tricuspid (valve) insufficiency: Secondary | ICD-10-CM

## 2024-08-07 DIAGNOSIS — I10 Essential (primary) hypertension: Secondary | ICD-10-CM

## 2024-08-07 DIAGNOSIS — I2510 Atherosclerotic heart disease of native coronary artery without angina pectoris: Secondary | ICD-10-CM

## 2024-08-07 DIAGNOSIS — N179 Acute kidney failure, unspecified: Secondary | ICD-10-CM

## 2024-08-07 DIAGNOSIS — F17219 Nicotine dependence, cigarettes, with unspecified nicotine-induced disorders: Secondary | ICD-10-CM

## 2024-08-07 DIAGNOSIS — K559 Vascular disorder of intestine, unspecified: Secondary | ICD-10-CM

## 2024-08-07 DIAGNOSIS — Z8639 Personal history of other endocrine, nutritional and metabolic disease: Secondary | ICD-10-CM

## 2024-08-11 ENCOUNTER — Ambulatory Visit (INDEPENDENT_AMBULATORY_CARE_PROVIDER_SITE_OTHER): Admitting: Nurse Practitioner

## 2024-08-11 ENCOUNTER — Encounter: Payer: Self-pay | Admitting: Nurse Practitioner

## 2024-08-11 ENCOUNTER — Telehealth: Payer: Self-pay

## 2024-08-11 VITALS — BP 136/80 | HR 88 | Temp 96.5°F | Resp 16 | Ht 64.0 in | Wt 100.4 lb

## 2024-08-11 DIAGNOSIS — K551 Chronic vascular disorders of intestine: Secondary | ICD-10-CM

## 2024-08-11 DIAGNOSIS — E782 Mixed hyperlipidemia: Secondary | ICD-10-CM

## 2024-08-11 DIAGNOSIS — D509 Iron deficiency anemia, unspecified: Secondary | ICD-10-CM

## 2024-08-11 MED ORDER — OXYCODONE HCL 5 MG PO TABS: 5.0000 mg | ORAL_TABLET | Freq: Every day | ORAL | 0 refills | Status: DC | PRN

## 2024-08-11 MED ORDER — OXYCODONE HCL 5 MG PO TABS: 5.0000 mg | ORAL_TABLET | Freq: Two times a day (BID) | ORAL | 0 refills | Status: DC | PRN

## 2024-08-11 NOTE — Addendum Note (Signed)
 Addended by: Tayvian Holycross on: 08/11/2024 02:56 PM   Modules accepted: Orders

## 2024-08-11 NOTE — Telephone Encounter (Signed)
 [facility] phar called that oxycodone  pres supposed to be twice a day as per alyssa canceled we sent she going to sent new pres with 2 times daily

## 2024-08-11 NOTE — Progress Notes (Signed)
 [facility] [address] [place], [place] [address]  Internal MEDICINE  Office Visit Note  Patient Name: [name]  [id number]  [id number]  Date of Service: 08/11/2024  Chief Complaint  Patient presents with   Depression   Hypertension   Hyperlipidemia   Follow-up    Review labs    HPI [name] presents for a follow-up visit for lab results. High LDL 133, triglycerides improved to normal.  Anemia -- improved to normal  Borderline low normal ferritin and iron. Very low iron saturation, elevated TIBC and UIBC. -- [facility] tomorrow.  PTH is normal, phos is normal. Calcium  returned to normal range.    Current Medication: Outpatient Encounter Medications as of 08/11/2024  Medication Sig   albuterol  (VENTOLIN  HFA) 108 (90 Base) MCG/ACT inhaler Inhale 2 puffs into the lungs every 4 (four) hours as needed for wheezing or shortness of breath.   amLODipine  (NORVASC ) 10 MG tablet Take 1 tablet (10 mg total) by mouth daily.   budesonide -formoterol  (SYMBICORT ) 80-4.5 MCG/ACT inhaler INHALE 2 PUFFS ONCE DAILY   Cholecalciferol  (VITAMIN D3) 10 MCG (400 UNIT) tablet Take 400 Units by mouth.   clopidogrel  (PLAVIX ) 75 MG tablet Take 75 mg by mouth daily.   cyclobenzaprine  (FLEXERIL ) 5 MG tablet Take 1 tablet (5 mg total) by mouth at bedtime as needed for muscle spasms.   oxyCODONE  (ROXICODONE ) 5 MG immediate release tablet Take 1 tablet (5 mg total) by mouth daily as needed for severe pain (pain score 7-10).   [DISCONTINUED] oxyCODONE  (ROXICODONE ) 5 MG immediate release tablet Take 1 tablet (5 mg total) by mouth daily as needed for severe pain (pain score 7-10).   No facility-administered encounter medications on file as of 08/11/2024.    Surgical History: Past Surgical History:  Procedure Laterality Date   ABDOMINAL HYSTERECTOMY     APPENDECTOMY     CARDIAC CATHETERIZATION     CARDIAC SURGERY     CHOLECYSTECTOMY     COLONOSCOPY  12/15/2011   OH->bleeding  internal hemorrhoids, otherwise normal   COLONOSCOPY WITH PROPOFOL  N/A 04/12/2015   Procedure: COLONOSCOPY WITH PROPOFOL ;  Surgeon: Rogelia Copping, MD;  Location: [facility];  Service: Endoscopy;  Laterality: N/A;   COLONOSCOPY WITH PROPOFOL  N/A 01/05/2020   Procedure: COLONOSCOPY WITH PROPOFOL ;  Surgeon: Copping Rogelia, MD;  Location: [facility];  Service: Endoscopy;  Laterality: N/A;   ENDARTERECTOMY FEMORAL Left 04/26/2015   Procedure: ENDARTERECTOMY FEMORAL;  Surgeon: Selinda GORMAN Gu, MD;  Location: [facility] OR[facility];  Service: Vascular;  Laterality: Left;   ENDOVASCULAR STENT INSERTION Bilateral    Legs   FLEXIBLE BRONCHOSCOPY N/A 02/02/2015   Procedure: FLEXIBLE BRONCHOSCOPY;  Surgeon: Elfreda DELENA Bathe, MD;  Location: [facility] OR[facility];  Service: Pulmonary;  Laterality: N/A;   HEMORRHOID SURGERY     lung mass removed     PERIPHERAL VASCULAR CATHETERIZATION Left 04/25/2015   Procedure: Lower Extremity Angiography;  Surgeon: Selinda GORMAN Gu, MD;  Location: [facility] CV [facility];  Service: Cardiovascular;  Laterality: Left;   PERIPHERAL VASCULAR CATHETERIZATION Left 04/25/2015   Procedure: Lower Extremity Intervention;  Surgeon: Selinda GORMAN Gu, MD;  Location: [facility];  Service: Cardiovascular;  Laterality: Left;   PERIPHERAL VASCULAR CATHETERIZATION Left 01/02/2016   Procedure: Lower Extremity Angiography;  Surgeon: Selinda GORMAN Gu, MD;  Location: [facility];  Service: Cardiovascular;  Laterality: Left;   PERIPHERAL VASCULAR CATHETERIZATION  01/02/2016   Procedure: Lower Extremity Intervention;  Surgeon: Selinda GORMAN Gu, MD;  Location: [facility] INVASIVE CV  LAB;  Service: Cardiovascular;;   THROMBECTOMY FEMORAL ARTERY Left 04/26/2015   Procedure: THROMBECTOMY FEMORAL ARTERY;  Surgeon: Selinda GORMAN Gu, MD;  Location: [facility] OR[facility];  Service: Vascular;  Laterality: Left;   VENTRAL HERNIA REPAIR N/A 07/25/2023   Procedure: HERNIA REPAIR VENTRAL ADULT, open;  Surgeon: Desiderio Schanz, MD;  Location: [facility];  Service:  General;  Laterality: N/A;   VISCERAL ANGIOGRAPHY N/A 04/06/2024   Procedure: VISCERAL ANGIOGRAPHY;  Surgeon: Gu Selinda GORMAN, MD;  Location: [facility];  Service: Cardiovascular;  Laterality: N/A;   VISCERAL ANGIOGRAPHY N/A 05/21/2024   Procedure: VISCERAL ANGIOGRAPHY;  Surgeon: Gu Selinda GORMAN, MD;  Location: [facility] INV[facility] CV LAB;  Service: Cardiovascular;  Laterality: N/A;    Medical History: Past Medical History:  Diagnosis Date   Adenocarcinoma of upper lobe of right lung (HCC)    a.) s/p posterior segmentectomy on [facility]/20/23 [facility]   Anemia    Arthritis    Asthma    Carotid artery stenosis    a.) doppler 05/14/2017: 1-39% BICA; b.) doppler 04/05/2018: 50-69% BICA; c.) doppler 06/12/2019: 40-59% BICA; d.) doppler 06/14/2020, 06/14/2021: 1-39% BICA; e.) doppler 06/19/2022: 1.39% RICA, 40-59% LICA   Colon polyp    COPD (chronic obstructive pulmonary disease) (HCC)    Coronary artery disease 04/03/2006   a.) LHC 04/03/2006: 25% mLM, 25% mLAD, 50% D1, 25/50% mLCx, 50% dLCx, 25% OM2, 100% pRCA --> med mgmt; b.) LHC 04/25/2010: 25% pLM, 25% pLAD, 50% pLCx, 70% dLCx, 100% pRCA --> med mgmt   Depression    Diastolic dysfunction    a.) TTE 04/05/2018: EF 65%, no RWMAs, G1DD, triv MR/TR; b.) TTE 10/15/2022: EF 88%, mod LVH, G1DD, triv TR, mild MR; c.) TTE 02/27/2023: EF 65%, no RWMAs, triv TR/MR, RVSP 35; d.) TTE 03/21/2023: EF 65%, no RWMAs, norm RVSF, PASP 44   Environmental allergies    GI bleed 03/24/2023   Headache    Hyperlipidemia    Hypertension    Hypoxic respiratory failure (HCC)    Internal hemorrhoids    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[facility])    a.) thinks it was in 2007 --> LHC 04/03/2006 revealed a 100% pRCA --> medically managed   Necrotizing pneumonia (HCC)    Pulmonary HTN (HCC)    a.) TTE 02/27/2023: RVSP 35 mmHg; b.) TTE 03/21/2023: PASP 44 mmHg   PVD (peripheral vascular disease) with claudication    Shortness of breath dyspnea    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    Substance abuse (HCC)    Substance induced mood disorder (HCC)    Suicidal ideation    Thrombocytopenia    Tobacco abuse    Unstable angina (HCC)    Ventral hernia    Vitamin D  deficiency     Family History: Family History  Problem Relation Age of Onset   Hypertension Mother    Hypertension Father     Social History   Socioeconomic History   Marital status: Single    Spouse name: Not on file   Number of children: Not on file   Years of education: Not on file   Highest education level: Not on file  Occupational History   Not on file  Tobacco Use   Smoking status: Every Day    Current packs/day: 1.00    Types: Cigarettes    Passive exposure: Past   Smokeless tobacco: Never   Tobacco comments:    1 PPD  Vaping Use   Vaping status: Never Used  Substance and  Sexual Activity   Alcohol use: Not Currently    Comment: weekends, couple beers, pint of liquer only on weekends.  Last drank approx 1 month ago.   Drug use: No   Sexual activity: Not Currently  Other Topics Concern   Not on file  Social History Narrative   Lives with family    Social Drivers of Health   Financial Resource Strain: Low Risk (03/21/2023)   Received from [facility]   Overall Financial Resource Strain (CARDIA)    Difficulty of Paying Living Expenses: Not hard at all  Food Insecurity: No Food Insecurity (04/13/2024)   Hunger Vital Sign    Worried About Running Out of Food in the Last Year: Never true    Ran Out of Food in the Last Year: Never true  Transportation Needs: No Transportation Needs (04/13/2024)   PRAPARE - Administrator, Civil Service (Medical): No    Lack of Transportation (Non-Medical): No  Physical Activity: Inactive (04/04/2018)   Exercise Vital Sign    Days of Exercise per Week: 0 days    Minutes of Exercise per Session: 0 min  Stress: No Stress Concern Present (04/04/2018)   [employer] of Occupational Health - Occupational Stress Questionnaire     Feeling of Stress : Only a little  Social Connections: Socially Isolated (04/06/2024)   Social Connection and Isolation Panel    Frequency of Communication with Friends and Family: Once a week    Frequency of Social Gatherings with Friends and Family: Once a week    Attends Religious Services: 1 to 4 times per year    Active Member of [employer] or Organizations: No    Attends Banker Meetings: Never    Marital Status: Never married  Intimate Partner Violence: Not At Risk (04/13/2024)   Humiliation, Afraid, Rape, and Kick questionnaire    Fear of Current or Ex-Partner: No    Emotionally Abused: No    Physically Abused: No    Sexually Abused: No      Review of Systems  Constitutional:  Positive for appetite change. Negative for chills, fatigue and unexpected weight change.  HENT:  Negative for congestion, rhinorrhea, sneezing and sore throat.   Eyes:  Negative for redness.  Respiratory: Negative.  Negative for cough, chest tightness, shortness of breath and wheezing.   Cardiovascular: Negative.  Negative for chest pain and palpitations.  Gastrointestinal:  Negative for abdominal pain, constipation, diarrhea, nausea and vomiting.  Genitourinary:  Negative for dysuria and frequency.  Musculoskeletal:  Negative for arthralgias, back pain, joint swelling and neck pain.  Skin:  Negative for rash.  Neurological: Negative.  Negative for tremors and numbness.  Hematological:  Negative for adenopathy. Does not bruise/bleed easily.  Psychiatric/Behavioral:  Positive for sleep disturbance. Negative for behavioral problems (Depression), self-injury and suicidal ideas. The patient is not nervous/anxious.     Vital Signs: BP 136/80   Pulse 88   Temp (!) 96.5 F (35.8 C)   Resp 16   Ht 5' 4 (1.626 m)   Wt 100 lb 6.4 oz (45.5 kg)   LMP  (LMP Unknown)   SpO2 98%   BMI 17.23 kg/m    Physical Exam Vitals reviewed.  Constitutional:      Appearance: Normal appearance. She is  normal weight.  HENT:     Head: Normocephalic and atraumatic.  Eyes:     Pupils: Pupils are equal, round, and reactive to light.  Cardiovascular:     Rate  and Rhythm: Normal rate and regular rhythm.  Pulmonary:     Effort: Pulmonary effort is normal. No respiratory distress.  Neurological:     Mental Status: She is alert and oriented to person, place, and time.  Psychiatric:        Mood and Affect: Mood normal.        Behavior: Behavior normal.        Assessment/Plan: 1. Iron deficiency anemia, unspecified iron deficiency anemia type (Primary) Anemia is improved. Iron panel is abnormal. Consider hematology referral.   2. Mixed hyperlipidemia Continue low cholesterol low fat diet.  3. Hypercalcemia Resolved. PTH was normal   4. Stenosis of inferior mesenteric artery Still having issues, has appt with [facility] vascular surgery tomorrow  - oxyCODONE  (ROXICODONE ) 5 MG immediate release tablet; Take 1 tablet (5 mg total) by mouth daily as needed for severe pain (pain score 7-10).  Dispense: 40 tablet; Refill: 0   General Counseling: [name] verbalizes understanding of the findings of todays visit and agrees with plan of treatment. I have discussed any further diagnostic evaluation that may be needed or ordered today. We also reviewed her medications today. she has been encouraged to call the office with any questions or concerns that should arise related to todays visit.    No orders of the defined types were placed in this encounter.   Meds ordered this encounter  Medications   oxyCODONE  (ROXICODONE ) 5 MG immediate release tablet    Sig: Take 1 tablet (5 mg total) by mouth daily as needed for severe pain (pain score 7-10).    Dispense:  40 tablet    Refill:  0    Note increased frequency and change in number of tablets. Please fill new script today.    Return in about 3 months (around 11/09/2024) for F/U, Morry Veiga PCP.   Total time spent:30 Minutes Time spent includes review  of chart, medications, test results, and follow up plan with the patient.   [place] Controlled Substance Database was reviewed by me.  This patient was seen by Mardy Maxin, FNP-C in collaboration with Dr. Sigrid Bathe as a part of collaborative care agreement.   Laquilla Dault R. Maxin, MSN, FNP-C Internal medicine

## 2024-08-13 ENCOUNTER — Ambulatory Visit (INDEPENDENT_AMBULATORY_CARE_PROVIDER_SITE_OTHER): Admitting: Cardiovascular Disease

## 2024-08-13 ENCOUNTER — Encounter: Payer: Self-pay | Admitting: Cardiovascular Disease

## 2024-08-13 VITALS — BP 120/82 | HR 90 | Ht 64.0 in | Wt 101.0 lb

## 2024-08-13 DIAGNOSIS — R000 Tachycardia, unspecified: Secondary | ICD-10-CM | POA: Diagnosis not present

## 2024-08-13 DIAGNOSIS — Z8639 Personal history of other endocrine, nutritional and metabolic disease: Secondary | ICD-10-CM | POA: Diagnosis not present

## 2024-08-13 DIAGNOSIS — F17219 Nicotine dependence, cigarettes, with unspecified nicotine-induced disorders: Secondary | ICD-10-CM

## 2024-08-13 DIAGNOSIS — R0602 Shortness of breath: Secondary | ICD-10-CM

## 2024-08-13 DIAGNOSIS — I2510 Atherosclerotic heart disease of native coronary artery without angina pectoris: Secondary | ICD-10-CM

## 2024-08-13 DIAGNOSIS — N179 Acute kidney failure, unspecified: Secondary | ICD-10-CM | POA: Diagnosis not present

## 2024-08-13 DIAGNOSIS — I10 Essential (primary) hypertension: Secondary | ICD-10-CM | POA: Diagnosis not present

## 2024-08-13 DIAGNOSIS — I6523 Occlusion and stenosis of bilateral carotid arteries: Secondary | ICD-10-CM | POA: Diagnosis not present

## 2024-08-13 DIAGNOSIS — I5023 Acute on chronic systolic (congestive) heart failure: Secondary | ICD-10-CM | POA: Diagnosis not present

## 2024-08-13 MED ORDER — CARVEDILOL 3.125 MG PO TABS: 3.1250 mg | ORAL_TABLET | Freq: Two times a day (BID) | ORAL | 11 refills | Status: AC

## 2024-08-13 MED ORDER — DAPAGLIFLOZIN PROPANEDIOL 10 MG PO TABS: 10.0000 mg | ORAL_TABLET | Freq: Every day | ORAL | 3 refills | Status: AC

## 2024-08-13 MED ORDER — LOSARTAN POTASSIUM 25 MG PO TABS: 25.0000 mg | ORAL_TABLET | Freq: Every day | ORAL | 1 refills | Status: DC

## 2024-08-13 NOTE — Progress Notes (Signed)
 Cardiology Office Note   Date:  08/13/2024   ID:  [name], DOB 1956-01-20, MRN [id number]  PCP:  Erika Fish, NP  Cardiologist:  Denyse Bathe, MD      History of Present Illness: [name] is a 68 y.o. female who presents for  Chief Complaint  Patient presents with   Follow-up    Echo results    No SOB, or chest pain or dizziness.      Past Medical History:  Diagnosis Date   Adenocarcinoma of upper lobe of right lung (HCC)    a.) s/p posterior segmentectomy on 03/20/23 at [facility]   Anemia    Arthritis    Asthma    Carotid artery stenosis    a.) doppler 05/14/2017: 1-39% BICA; b.) doppler 04/05/2018: 50-69% BICA; c.) doppler 06/12/2019: 40-59% BICA; d.) doppler 06/14/2020, 06/14/2021: 1-39% BICA; e.) doppler 06/19/2022: 1.39% RICA, 40-59% LICA   Colon polyp    COPD (chronic obstructive pulmonary disease) (HCC)    Coronary artery disease 04/03/2006   a.) LHC 04/03/2006: 25% mLM, 25% mLAD, 50% D1, 25/50% mLCx, 50% dLCx, 25% OM2, 100% pRCA --> med mgmt; b.) LHC 04/25/2010: 25% pLM, 25% pLAD, 50% pLCx, 70% dLCx, 100% pRCA --> med mgmt   Depression    Diastolic dysfunction    a.) TTE 04/05/2018: EF 65%, no RWMAs, G1DD, triv MR/TR; b.) TTE 10/15/2022: EF 88%, mod LVH, G1DD, triv TR, mild MR; c.) TTE 02/27/2023: EF 65%, no RWMAs, triv TR/MR, RVSP 35; d.) TTE 03/21/2023: EF 65%, no RWMAs, norm RVSF, PASP 44   Environmental allergies    GI bleed 03/24/2023   Headache    Hyperlipidemia    Hypertension    Hypoxic respiratory failure (HCC)    Internal hemorrhoids    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[facility])    a.) thinks it was in 2007 --> LHC 04/03/2006 revealed a 100% pRCA --> medically managed   Necrotizing pneumonia (HCC)    Pulmonary HTN (HCC)    a.) TTE 02/27/2023: RVSP 35 mmHg; b.) TTE 03/21/2023: PASP 44 mmHg   PVD (peripheral vascular disease) with claudication    Shortness of breath dyspnea    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    Substance abuse  (HCC)    Substance induced mood disorder (HCC)    Suicidal ideation    Thrombocytopenia    Tobacco abuse    Unstable angina (HCC)    Ventral hernia    Vitamin D  deficiency      Past Surgical History:  Procedure Laterality Date   ABDOMINAL HYSTERECTOMY     APPENDECTOMY     CARDIAC CATHETERIZATION     CARDIAC SURGERY     CHOLECYSTECTOMY     COLONOSCOPY  12/15/2011   OH->bleeding internal hemorrhoids, otherwise normal   COLONOSCOPY WITH PROPOFOL  N/A 04/12/2015   Procedure: COLONOSCOPY WITH PROPOFOL ;  Surgeon: Rogelia Copping, MD;  Location: [facility] ENDOSCOPY;  Service: Endoscopy;  Laterality: N/A;   COLONOSCOPY WITH PROPOFOL  N/A 01/05/2020   Procedure: COLONOSCOPY WITH PROPOFOL ;  Surgeon: Copping Rogelia, MD;  Location: [facility] ENDOSCOPY;  Service: Endoscopy;  Laterality: N/A;   ENDARTERECTOMY FEMORAL Left 04/26/2015   Procedure: ENDARTERECTOMY FEMORAL;  Surgeon: Selinda GORMAN Gu, MD;  Location: [facility] ORS;  Service: Vascular;  Laterality: Left;   ENDOVASCULAR STENT INSERTION Bilateral    Legs   FLEXIBLE BRONCHOSCOPY N/A 02/02/2015   Procedure: FLEXIBLE BRONCHOSCOPY;  Surgeon: Elfreda DELENA Bathe, MD;  Location: [facility] ORS;  Service: Pulmonary;  Laterality: N/A;   HEMORRHOID SURGERY  lung mass removed     PERIPHERAL VASCULAR CATHETERIZATION Left 04/25/2015   Procedure: Lower Extremity Angiography;  Surgeon: Selinda GORMAN Gu, MD;  Location: [facility] INVASIVE CV LAB;  Service: Cardiovascular;  Laterality: Left;   PERIPHERAL VASCULAR CATHETERIZATION Left 04/25/2015   Procedure: Lower Extremity Intervention;  Surgeon: Selinda GORMAN Gu, MD;  Location: [facility] INV[facility];  Service: Cardiovascular;  Laterality: Left;   PERIPHERAL VASCULAR CATHETERIZATION Left 01/02/2016   Procedure: Lower Extremity Angiography;  Surgeon: Selinda GORMAN Gu, MD;  Location: [facility];  Service: Cardiovascular;  Laterality: Left;   PERIPHERAL VASCULAR CATHETERIZATION  01/02/2016   Procedure: Lower Extremity Intervention;  Surgeon: Selinda GORMAN Gu, MD;  Location: [facility];  Service: Cardiovascular;;   THROMBECTOMY FEMORAL ARTERY Left 04/26/2015   Procedure: THROMBECTOMY FEMORAL ARTERY;  Surgeon: Selinda GORMAN Gu, MD;  Location: [facility] ORS;  Service: Vascular;  Laterality: Left;   VENTRAL HERNIA REPAIR N/A 07/25/2023   Procedure: HERNIA REPAIR VENTRAL ADULT, open;  Surgeon: Desiderio Schanz, MD;  Location: [facility] ORS;  Service: General;  Laterality: N/A;   VISCERAL ANGIOGRAPHY N/A 04/06/2024   Procedure: VISCERAL ANGIOGRAPHY;  Surgeon: Gu Selinda GORMAN, MD;  Location: [facility];  Service: Cardiovascular;  Laterality: N/A;   VISCERAL ANGIOGRAPHY N/A 05/21/2024   Procedure: VISCERAL ANGIOGRAPHY;  Surgeon: Gu Selinda GORMAN, MD;  Location: [facility];  Service: Cardiovascular;  Laterality: N/A;     Current Outpatient Medications  Medication Sig Dispense Refill   albuterol  (VENTOLIN  HFA) 108 (90 Base) MCG/ACT inhaler Inhale 2 puffs into the lungs every 4 (four) hours as needed for wheezing or shortness of breath. 9 g 1   budesonide -formoterol  (SYMBICORT ) 80-4.5 MCG/ACT inhaler INHALE 2 PUFFS ONCE DAILY 22 g 2   carvedilol (COREG) 3.125 MG tablet Take 1 tablet (3.125 mg total) by mouth 2 (two) times daily. 60 tablet 11   Cholecalciferol  (VITAMIN D3) 10 MCG (400 UNIT) tablet Take 400 Units by mouth.     clopidogrel  (PLAVIX ) 75 MG tablet Take 75 mg by mouth daily.     cyclobenzaprine  (FLEXERIL ) 5 MG tablet Take 1 tablet (5 mg total) by mouth at bedtime as needed for muscle spasms. 30 tablet 3   dapagliflozin propanediol (FARXIGA) 10 MG TABS tablet Take 1 tablet (10 mg total) by mouth daily before breakfast. 30 tablet 3   losartan  (COZAAR ) 25 MG tablet Take 1 tablet (25 mg total) by mouth daily. 90 tablet 1   oxyCODONE  (ROXICODONE ) 5 MG immediate release tablet Take 1 tablet (5 mg total) by mouth 2 (two) times daily as needed for severe pain (pain score 7-10). 40 tablet 0   No current facility-administered medications for this  visit.    Allergies:   Tylenol  [acetaminophen ], Aspirin , Zyrtec [cetirizine], and Tramadol    Social History:   reports that she has been smoking cigarettes. She has been exposed to tobacco smoke. She has never used smokeless tobacco. She reports that she does not currently use alcohol. She reports that she does not use drugs.   Family History:  family history includes Hypertension in her father and mother.    ROS:     Review of Systems  Constitutional: Negative.   HENT: Negative.    Eyes: Negative.   Respiratory: Negative.    Gastrointestinal: Negative.   Genitourinary: Negative.   Musculoskeletal: Negative.   Skin: Negative.   Neurological: Negative.   Endo/Heme/Allergies: Negative.   Psychiatric/Behavioral: Negative.    All other systems reviewed and  are negative.     All other systems are reviewed and negative.    PHYSICAL EXAM: VS:  BP 120/82   Pulse 90   Ht 5' 4 (1.626 m)   Wt 101 lb (45.8 kg)   LMP  (LMP Unknown)   SpO2 94%   BMI 17.34 kg/m  , BMI Body mass index is 17.34 kg/m. Last weight:  Wt Readings from Last 3 Encounters:  08/13/24 101 lb (45.8 kg)  08/11/24 100 lb 6.4 oz (45.5 kg)  07/23/24 98 lb (44.5 kg)     Physical Exam Constitutional:      Appearance: Normal appearance.  Cardiovascular:     Rate and Rhythm: Normal rate and regular rhythm.     Heart sounds: Normal heart sounds.  Pulmonary:     Effort: Pulmonary effort is normal.     Breath sounds: Normal breath sounds.  Musculoskeletal:     Right lower leg: No edema.     Left lower leg: No edema.  Neurological:     Mental Status: She is alert.       EKG:   Recent Labs: 04/24/2024: ALT 11; Magnesium  1.7; Potassium 4.4; Sodium 139 05/21/2024: BUN 11 07/27/2024: Creatinine, Ser 0.72; Hemoglobin 11.5; Platelets 251    Lipid Panel    Component Value Date/Time   CHOL 200 (H) 07/27/2024 0957   CHOL 147 10/16/2013 0439   TRIG 113 07/27/2024 0957   TRIG 122 10/16/2013 0439   HDL  47 07/27/2024 0957   HDL 31 (L) 10/16/2013 0439   CHOLHDL 4.3 07/27/2024 0957   CHOLHDL 3.3 06/18/2023 0707   VLDL 37 06/18/2023 0707   VLDL 24 10/16/2013 0439   LDLCALC 133 (H) 07/27/2024 0957   LDLCALC 92 10/16/2013 0439      Other studies Reviewed: Additional studies/ records that were reviewed today include:  Review of the above records demonstrates:       No data to display            ASSESSMENT AND PLAN:    ICD-10-CM   1. H/O hypercholesterolemia  Z86.39 losartan  (COZAAR ) 25 MG tablet    carvedilol (COREG) 3.125 MG tablet    dapagliflozin propanediol (FARXIGA) 10 MG TABS tablet    2. Bilateral carotid artery stenosis  I65.23 losartan  (COZAAR ) 25 MG tablet    carvedilol (COREG) 3.125 MG tablet    dapagliflozin propanediol (FARXIGA) 10 MG TABS tablet    3. AKI (acute kidney injury)  N17.9 losartan  (COZAAR ) 25 MG tablet    carvedilol (COREG) 3.125 MG tablet    dapagliflozin propanediol (FARXIGA) 10 MG TABS tablet    4. Essential hypertension  I10 losartan  (COZAAR ) 25 MG tablet    carvedilol (COREG) 3.125 MG tablet    dapagliflozin propanediol (FARXIGA) 10 MG TABS tablet    5. Cigarette nicotine  dependence with nicotine -induced disorder  F17.219 losartan  (COZAAR ) 25 MG tablet    carvedilol (COREG) 3.125 MG tablet    dapagliflozin propanediol (FARXIGA) 10 MG TABS tablet    6. Sinus tachycardia  R00.0 losartan  (COZAAR ) 25 MG tablet    carvedilol (COREG) 3.125 MG tablet    dapagliflozin propanediol (FARXIGA) 10 MG TABS tablet    7. Coronary artery disease involving native coronary artery of native heart without angina pectoris  I25.10 losartan  (COZAAR ) 25 MG tablet    carvedilol (COREG) 3.125 MG tablet    dapagliflozin propanediol (FARXIGA) 10 MG TABS tablet    8. SOB (shortness of breath)  R06.02 losartan  (COZAAR ) 25 MG  tablet    carvedilol (COREG) 3.125 MG tablet    dapagliflozin propanediol (FARXIGA) 10 MG TABS tablet    9. Acute on chronic systolic  congestive heart failure (HCC)  I50.23 losartan  (COZAAR ) 25 MG tablet    carvedilol (COREG) 3.125 MG tablet    dapagliflozin propanediol (FARXIGA) 10 MG TABS tablet   LVEF 45%, on echo and is sOB.       Problem List Items Addressed This Visit       Cardiovascular and Mediastinum   Essential hypertension   Relevant Medications   losartan  (COZAAR ) 25 MG tablet   carvedilol (COREG) 3.125 MG tablet   dapagliflozin propanediol (FARXIGA) 10 MG TABS tablet   Carotid stenosis   Relevant Medications   losartan  (COZAAR ) 25 MG tablet   carvedilol (COREG) 3.125 MG tablet   dapagliflozin propanediol (FARXIGA) 10 MG TABS tablet     Nervous and Auditory   Cigarette nicotine  dependence with nicotine -induced disorder   Relevant Medications   losartan  (COZAAR ) 25 MG tablet   carvedilol (COREG) 3.125 MG tablet   dapagliflozin propanediol (FARXIGA) 10 MG TABS tablet     Genitourinary   AKI (acute kidney injury)   Relevant Medications   losartan  (COZAAR ) 25 MG tablet   carvedilol (COREG) 3.125 MG tablet   dapagliflozin propanediol (FARXIGA) 10 MG TABS tablet     Other   H/O hypercholesterolemia - Primary   Relevant Medications   losartan  (COZAAR ) 25 MG tablet   carvedilol (COREG) 3.125 MG tablet   dapagliflozin propanediol (FARXIGA) 10 MG TABS tablet   Sinus tachycardia   Relevant Medications   losartan  (COZAAR ) 25 MG tablet   carvedilol (COREG) 3.125 MG tablet   dapagliflozin propanediol (FARXIGA) 10 MG TABS tablet   Other Visit Diagnoses       Coronary artery disease involving native coronary artery of native heart without angina pectoris       Relevant Medications   losartan  (COZAAR ) 25 MG tablet   carvedilol (COREG) 3.125 MG tablet   dapagliflozin propanediol (FARXIGA) 10 MG TABS tablet     SOB (shortness of breath)       Relevant Medications   losartan  (COZAAR ) 25 MG tablet   carvedilol (COREG) 3.125 MG tablet   dapagliflozin propanediol (FARXIGA) 10 MG TABS tablet      Acute on chronic systolic congestive heart failure (HCC)       LVEF 45%, on echo and is sOB.   Relevant Medications   losartan  (COZAAR ) 25 MG tablet   carvedilol (COREG) 3.125 MG tablet   dapagliflozin propanediol (FARXIGA) 10 MG TABS tablet          Disposition:   Return in about 4 weeks (around 09/10/2024).    Total time spent: 40 minutes  Signed,  Denyse Bathe, MD  08/13/2024 9:39 AM    [facility]

## 2024-08-24 ENCOUNTER — Encounter: Payer: Self-pay | Admitting: Nurse Practitioner

## 2024-08-24 DIAGNOSIS — F331 Major depressive disorder, recurrent, moderate: Secondary | ICD-10-CM | POA: Insufficient documentation

## 2024-09-02 ENCOUNTER — Ambulatory Visit
Admission: RE | Admit: 2024-09-02 | Discharge: 2024-09-02 | Disposition: A | Discharge status: Home / Self Care | Source: Ambulatory Visit | Attending: Nurse Practitioner | Admitting: Nurse Practitioner

## 2024-09-02 DIAGNOSIS — Z1231 Encounter for screening mammogram for malignant neoplasm of breast: Secondary | ICD-10-CM | POA: Insufficient documentation

## 2024-09-07 ENCOUNTER — Other Ambulatory Visit: Payer: Self-pay | Admitting: Nurse Practitioner

## 2024-09-07 DIAGNOSIS — R109 Unspecified abdominal pain: Secondary | ICD-10-CM

## 2024-09-10 ENCOUNTER — Encounter: Payer: Self-pay | Admitting: Cardiovascular Disease

## 2024-09-10 ENCOUNTER — Ambulatory Visit: Admitting: Cardiovascular Disease

## 2024-09-10 VITALS — BP 152/78 | HR 84 | Ht 64.0 in | Wt 101.0 lb

## 2024-09-10 DIAGNOSIS — R000 Tachycardia, unspecified: Secondary | ICD-10-CM | POA: Diagnosis not present

## 2024-09-10 DIAGNOSIS — I2510 Atherosclerotic heart disease of native coronary artery without angina pectoris: Secondary | ICD-10-CM | POA: Diagnosis not present

## 2024-09-10 DIAGNOSIS — R0602 Shortness of breath: Secondary | ICD-10-CM | POA: Diagnosis not present

## 2024-09-10 DIAGNOSIS — N179 Acute kidney failure, unspecified: Secondary | ICD-10-CM

## 2024-09-10 DIAGNOSIS — Z8639 Personal history of other endocrine, nutritional and metabolic disease: Secondary | ICD-10-CM

## 2024-09-10 DIAGNOSIS — F17219 Nicotine dependence, cigarettes, with unspecified nicotine-induced disorders: Secondary | ICD-10-CM

## 2024-09-10 DIAGNOSIS — I10 Essential (primary) hypertension: Secondary | ICD-10-CM

## 2024-09-10 DIAGNOSIS — I5023 Acute on chronic systolic (congestive) heart failure: Secondary | ICD-10-CM | POA: Diagnosis not present

## 2024-09-10 DIAGNOSIS — I6523 Occlusion and stenosis of bilateral carotid arteries: Secondary | ICD-10-CM

## 2024-09-10 MED ORDER — LOSARTAN POTASSIUM 100 MG PO TABS: 100.0000 mg | ORAL_TABLET | Freq: Every day | ORAL | 11 refills | Status: AC

## 2024-09-10 NOTE — Progress Notes (Signed)
 "     Cardiology Office Note   Date:  09/10/2024   ID:  [name], DOB [birth year], MRN [id number]  PCP:  Liana Fish, NP  Cardiologist:  Denyse Bathe, MD      History of Present Illness: [name] is a 69 y.o. female who presents for  Chief Complaint  Patient presents with   Follow-up    4 weeks follow up    No chest pain or SOB.      Past Medical History:  Diagnosis Date   Adenocarcinoma of upper lobe of right lung (HCC)    a.) s/p posterior segmentectomy on 03/20/23 at [facility]   Anemia    Arthritis    Asthma    Carotid artery stenosis    a.) doppler 05/14/2017: 1-39% BICA; b.) doppler 04/05/2018: 50-69% BICA; c.) doppler 06/12/2019: 40-59% BICA; d.) doppler 06/14/2020, 06/14/2021: 1-39% BICA; e.) doppler 06/19/2022: 1.39% RICA, 40-59% LICA   Colon polyp    COPD (chronic obstructive pulmonary disease) (HCC)    Coronary artery disease 04/03/2006   a.) LHC 04/03/2006: 25% mLM, 25% mLAD, 50% D1, 25/50% mLCx, 50% dLCx, 25% OM2, 100% pRCA --> med mgmt; b.) LHC 04/25/2010: 25% pLM, 25% pLAD, 50% pLCx, 70% dLCx, 100% pRCA --> med mgmt   Depression    Diastolic dysfunction    a.) TTE 04/05/2018: EF 65%, no RWMAs, G1DD, triv MR/TR; b.) TTE 10/15/2022: EF 88%, mod LVH, G1DD, triv TR, mild MR; c.) TTE 02/27/2023: EF 65%, no RWMAs, triv TR/MR, RVSP 35; d.) TTE 03/21/2023: EF 65%, no RWMAs, norm RVSF, PASP 44   Environmental allergies    GI bleed 03/24/2023   Headache    Hyperlipidemia    Hypertension    Hypoxic respiratory failure (HCC)    Internal hemorrhoids    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[facility])    a.) thinks it was in 2007 --> LHC 04/03/2006 revealed a 100% pRCA --> medically managed   Necrotizing pneumonia (HCC)    Pulmonary HTN (HCC)    a.) TTE 02/27/2023: RVSP 35 mmHg; b.) TTE 03/21/2023: PASP 44 mmHg   PVD (peripheral vascular disease) with claudication    Shortness of breath dyspnea    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    Substance abuse (HCC)     Substance induced mood disorder (HCC)    Suicidal ideation    Thrombocytopenia    Tobacco abuse    Unstable angina (HCC)    Ventral hernia    Vitamin D  deficiency      Past Surgical History:  Procedure Laterality Date   ABDOMINAL HYSTERECTOMY     APPENDECTOMY     CARDIAC CATHETERIZATION     CARDIAC SURGERY     CHOLECYSTECTOMY     COLONOSCOPY  12/15/2011   OH->bleeding internal hemorrhoids, otherwise normal   COLONOSCOPY WITH PROPOFOL  N/A 04/12/2015   Procedure: COLONOSCOPY WITH PROPOFOL ;  Surgeon: Rogelia Copping, MD;  Location: [facility] ENDOSCOPY;  Service: Endoscopy;  Laterality: N/A;   COLONOSCOPY WITH PROPOFOL  N/A 01/05/2020   Procedure: COLONOSCOPY WITH PROPOFOL ;  Surgeon: Copping Rogelia, MD;  Location: [facility] ENDOS[facility];  Service: Endoscopy;  Laterality: N/A;   ENDARTERECTOMY FEMORAL Left 04/26/2015   Procedure: ENDARTERECTOMY FEMORAL;  Surgeon: Selinda GORMAN Gu, MD;  Location: [facility] ORS;  Service: Vascular;  Laterality: Left;   ENDOVASCULAR STENT INSERTION Bilateral    Legs   FLEXIBLE BRONCHOSCOPY N/A 02/02/2015   Procedure: FLEXIBLE BRONCHOSCOPY;  Surgeon: Elfreda DELENA Bathe, MD;  Location: [facility] ORS;  Service: Pulmonary;  Laterality: N/A;  HEMORRHOID SURGERY     lung mass removed     PERIPHERAL VASCULAR CATHETERIZATION Left 04/25/2015   Procedure: Lower Extremity Angiography;  Surgeon: Selinda GORMAN Gu, MD;  Location: [facility];  Service: Cardiovascular;  Laterality: Left;   PERIPHERAL VASCULAR CATHETERIZATION Left 04/25/2015   Procedure: Lower Extremity Intervention;  Surgeon: Selinda GORMAN Gu, MD;  Location: [facility];  Service: Cardiovascular;  Laterality: Left;   PERIPHERAL VASCULAR CATHETERIZATION Left 01/02/2016   Procedure: Lower Extremity Angiography;  Surgeon: Selinda GORMAN Gu, MD;  Location: [facility];  Service: Cardiovascular;  Laterality: Left;   PERIPHERAL VASCULAR CATHETERIZATION  01/02/2016   Procedure: Lower Extremity Intervention;  Surgeon: Selinda GORMAN Gu, MD;   Location: [facility];  Service: Cardiovascular;;   THROMBECTOMY FEMORAL ARTERY Left 04/26/2015   Procedure: THROMBECTOMY FEMORAL ARTERY;  Surgeon: Selinda GORMAN Gu, MD;  Location: [facility];  Service: Vascular;  Laterality: Left;   VENTRAL HERNIA REPAIR N/A 07/25/2023   Procedure: HERNIA REPAIR VENTRAL ADULT, open;  Surgeon: Desiderio Schanz, MD;  Location: [facility];  Service: General;  Laterality: N/A;   VISCERAL ANGIOGRAPHY N/A 04/06/2024   Procedure: VISCERAL ANGIOGRAPHY;  Surgeon: Gu Selinda GORMAN, MD;  Location: [facility];  Service: Cardiovascular;  Laterality: N/A;   VISCERAL ANGIOGRAPHY N/A 05/21/2024   Procedure: VISCERAL ANGIOGRAPHY;  Surgeon: Gu Selinda GORMAN, MD;  Location: [facility];  Service: Cardiovascular;  Laterality: N/A;     Current Outpatient Medications  Medication Sig Dispense Refill   albuterol  (VENTOLIN  HFA) 108 (90 Base) MCG/ACT inhaler Inhale 2 puffs into the lungs every 4 (four) hours as needed for wheezing or shortness of breath. 9 g 1   budesonide -formoterol  (SYMBICORT ) 80-4.5 MCG/ACT inhaler INHALE 2 PUFFS ONCE DAILY 22 g 2   carvedilol  (COREG ) 3.125 MG tablet Take 1 tablet (3.125 mg total) by mouth 2 (two) times daily. 60 tablet 11   Cholecalciferol  (VITAMIN D3) 10 MCG (400 UNIT) tablet Take 400 Units by mouth.     clopidogrel  (PLAVIX ) 75 MG tablet Take 75 mg by mouth daily.     cyclobenzaprine  (FLEXERIL ) 5 MG tablet TAKE 1 TABLET BY MOUTH AT BEDTIME AS NEEDED FOR MUSCLE SPASM 30 tablet 0   dapagliflozin  propanediol (FARXIGA ) 10 MG TABS tablet Take 1 tablet (10 mg total) by mouth daily before breakfast. 30 tablet 3   losartan  (COZAAR ) 100 MG tablet Take 1 tablet (100 mg total) by mouth daily. 30 tablet 11   oxyCODONE  (ROXICODONE ) 5 MG immediate release tablet Take 1 tablet (5 mg total) by mouth 2 (two) times daily as needed for severe pain (pain score 7-10). 40 tablet 0   No current facility-administered medications for this visit.    Allergies:    Tylenol  [acetaminophen ], Aspirin , Zyrtec [cetirizine], and Tramadol    Social History:   reports that she has been smoking cigarettes. She has been exposed to tobacco smoke. She has never used smokeless tobacco. She reports that she does not currently use alcohol. She reports that she does not use drugs.   Family History:  family history includes Hypertension in her father and mother.    ROS:     Review of Systems  Constitutional: Negative.   HENT: Negative.    Eyes: Negative.   Respiratory: Negative.    Gastrointestinal: Negative.   Genitourinary: Negative.   Musculoskeletal: Negative.   Skin: Negative.   Neurological: Negative.   Endo/Heme/Allergies: Negative.   Psychiatric/Behavioral: Negative.    All other  systems reviewed and are negative.     All other systems are reviewed and negative.    PHYSICAL EXAM: VS:  BP (!) 152/78   Pulse 84   Ht 5' 4 (1.626 m)   Wt 101 lb (45.8 kg)   LMP  (LMP Unknown)   SpO2 96%   BMI 17.34 kg/m  , BMI Body mass index is 17.34 kg/m. Last weight:  Wt Readings from Last 3 Encounters:  09/10/24 101 lb (45.8 kg)  08/13/24 101 lb (45.8 kg)  08/11/24 100 lb 6.4 oz (45.5 kg)     Physical Exam Constitutional:      Appearance: Normal appearance.  Cardiovascular:     Rate and Rhythm: Normal rate and regular rhythm.     Heart sounds: Normal heart sounds.  Pulmonary:     Effort: Pulmonary effort is normal.     Breath sounds: Normal breath sounds.  Musculoskeletal:     Right lower leg: No edema.     Left lower leg: No edema.  Neurological:     Mental Status: She is alert.       EKG:   Recent Labs: 04/24/2024: ALT 11; Magnesium  1.7; Potassium 4.4; Sodium 139 05/21/2024: BUN 11 07/27/2024: Creatinine, Ser 0.72; Hemoglobin 11.5; Platelets 251    Lipid Panel    Component Value Date/Time   CHOL 200 (H) 07/27/2024 0957   CHOL 147 10/16/2013 0439   TRIG 113 07/27/2024 0957   TRIG 122 10/16/2013 0439   HDL 47 07/27/2024 0957    HDL 31 (L) 10/16/2013 0439   CHOLHDL 4.3 07/27/2024 0957   CHOLHDL 3.3 06/18/2023 0707   VLDL 37 06/18/2023 0707   VLDL 24 10/16/2013 0439   LDLCALC 133 (H) 07/27/2024 0957   LDLCALC 92 10/16/2013 0439      Other studies Reviewed: Additional studies/ records that were reviewed today include:  Review of the above records demonstrates:       No data to display            ASSESSMENT AND PLAN:    ICD-10-CM   1. Acute on chronic systolic congestive heart failure (HCC)  I50.23 losartan  (COZAAR ) 100 MG tablet   LVEF 45%, BP high, go to 100 mg losartan     2. SOB (shortness of breath)  R06.02 losartan  (COZAAR ) 100 MG tablet    3. Coronary artery disease involving native coronary artery of native heart without angina pectoris  I25.10 losartan  (COZAAR ) 100 MG tablet    4. Sinus tachycardia  R00.0 losartan  (COZAAR ) 100 MG tablet    5. Cigarette nicotine  dependence with nicotine -induced disorder  F17.219 losartan  (COZAAR ) 100 MG tablet    6. Essential hypertension  I10 losartan  (COZAAR ) 100 MG tablet   BP high as did not take medications this morning.    7. AKI (acute kidney injury)  N17.9 losartan  (COZAAR ) 100 MG tablet    8. Bilateral carotid artery stenosis  I65.23 losartan  (COZAAR ) 100 MG tablet    9. H/O hypercholesterolemia  Z86.39 losartan  (COZAAR ) 100 MG tablet       Problem List Items Addressed This Visit       Cardiovascular and Mediastinum   Essential hypertension   Relevant Medications   losartan  (COZAAR ) 100 MG tablet   Carotid stenosis   Relevant Medications   losartan  (COZAAR ) 100 MG tablet     Nervous and Auditory   Cigarette nicotine  dependence with nicotine -induced disorder   Relevant Medications   losartan  (COZAAR ) 100 MG tablet  Genitourinary   AKI (acute kidney injury)   Relevant Medications   losartan  (COZAAR ) 100 MG tablet     Other   H/O hypercholesterolemia   Relevant Medications   losartan  (COZAAR ) 100 MG tablet   Sinus  tachycardia   Relevant Medications   losartan  (COZAAR ) 100 MG tablet   Other Visit Diagnoses       Acute on chronic systolic congestive heart failure (HCC)    -  Primary   LVEF 45%, BP high, go to 100 mg losartan    Relevant Medications   losartan  (COZAAR ) 100 MG tablet     SOB (shortness of breath)       Relevant Medications   losartan  (COZAAR ) 100 MG tablet     Coronary artery disease involving native coronary artery of native heart without angina pectoris       Relevant Medications   losartan  (COZAAR ) 100 MG tablet          Disposition:   No follow-ups on file.    Total time spent: 30 minutes  Signed,  Denyse Bathe, MD  09/10/2024 10:44 AM    [facility] "

## 2024-09-21 ENCOUNTER — Other Ambulatory Visit (INDEPENDENT_AMBULATORY_CARE_PROVIDER_SITE_OTHER): Payer: Self-pay | Admitting: Nurse Practitioner

## 2024-09-21 DIAGNOSIS — K559 Vascular disorder of intestine, unspecified: Secondary | ICD-10-CM

## 2024-09-21 NOTE — Progress Notes (Unsigned)
 "   09/22/2024 [name] 1956-06-20  Gastroenterology Office Note     Primary Care Physician:  Liana Fish, NP  Primary GI Provider: Celestia Rima, NP; Jinny Carmine, MD    Chief Complaint   Chief Complaint  Patient presents with   Follow-up    Diarrhea every other day- she said it has improved- no constipation- abdominal pain depends on what she eats- she said eating left over foods cause her abdominal pain     History of Present Illness   [name] is a 69 y.o. female with  PMHX of mesenteric ischemia (s/p PCI for SMA occlusion on 04/06/24), incarcerated incisional epigastric hernia repair 07/25/2023, CAD c/b MI, HTN, HLD, PVD, right upper lobe adenocarcinoma presenting today for follow up.   Discussed the use of AI scribe software for clinical note transcription with the patient, who gave verbal consent to proceed.  Abdominal pain has improved compared to prior visits and now occurs only with overeating or consumption of leftovers. Pain is described as painful but brief. Bread does not worsen symptoms. She denies gassiness or bloating.   Chronic mesenteric ischemia with prior catheter and stent placement in August 2025. She has been evaluated at [facility] and by local vascular surgery, and reports being told that further surgery would not be helpful.  Bowel habits are irregular, with no daily bowel movements and uncertain frequency. The last recalled bowel movement was approximately six days ago. Stools are typically loose and sometimes watery, occasionally with a small formed piece, and rarely more formed or mushy. She does not have more than three watery stools per day and does not experience frequent diarrhea. No oily or fatty stools. Denies melena or hematochezia. No symptoms with dairy intake and no prior celiac testing.  She does not currently take fiber supplements but consumes a lot of bread. Previous use of Benefiber and Miralax , with Miralax  being  effective, but not used recently. She drinks [facility] throughout the day and only drinks water if [facility] is unavailable.  She continues to smoke, sometimes more than two packs per day, and has attempted to quit in the past without success.   Patient seen at [facility] vascular surgery on 08/12/2024.  Discussed that patient would likely not benefit from repeat surgical interventions and referred back to vascular surgeon closer to home.   Patient last seen by myself 07/23/2024 due to abdominal pain; diarrhea and alternating constipation.   [facility] for abdominal pain, nausea, emesis, intermittent diarrhea mixed with BRB. Reassuring lactate, CBC, lipase, PT-INR, troponin, and CMP.    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 The celiac artery is stented and patent but diseased distally.  -Severe luminal narrowing of the takeoff of the left greater than right renal arteries without visible parenchymal changes on arterial or venous phase. Additional more inferior left accessory renal artery without significant luminal narrowing.  -Additional underlying severe calcified and noncalcified atherosclerotic disease of the abdominal aorta, mesenteric, renal, and iliac arteries, which remain normal caliber and patent.  - Sequela of left endarterectomy with partially imaged luminal narrowing of the superficial femoral artery. Consider further evaluation with dedicated left lower extremity CTA runoff if clinically indicated.  - Normal enhancing bowel without pneumatosis or abnormal bowel wall thickening or additional evidence of bowel ischemia.  - Multiple solid pulmonary nodules measuring up to 0.5 cm. Further evaluation could be obtained  with dedicated chest CT in 12 months if clinically indicated per Fleischner criteria.  - There  is no evidence of bowel pneumatosis to indicate ischemic bowel.    01/02/2016: LLE angiogram with left femoral embolectomy at OSH  - 04/06/2024: celiac artery stent placement at OSH  - 05/21/2024: angiography of mesenteric arteries at OSH   01/05/2020 colonoscopy - One 7 mm polyp in the sigmoid colon, removed with a hot snare. Resected and retrieved.  - One 3 mm polyp in the cecum, removed with a cold biopsy forceps. Resected and retrieved.  - Non-bleeding internal hemorrhoids - repeat in 5 years for surveillance  Past Medical History:  Diagnosis Date   Adenocarcinoma of upper lobe of right lung (HCC)    a.) s/p posterior segmentectomy on 03/20/23 at [facility]   Anemia    Arthritis    Asthma    Carotid artery stenosis    a.) doppler 05/14/2017: 1-39% BICA; b.) doppler 04/05/2018: 50-69% BICA; c.) doppler 06/12/2019: 40-59% BICA; d.) doppler 06/14/2020, 06/14/2021: 1-39% BICA; e.) doppler 06/19/2022: 1.39% RICA, 40-59% LICA   Colon polyp    COPD (chronic obstructive pulmonary disease) (HCC)    Coronary artery disease 04/03/2006   a.) LHC 04/03/2006: 25% mLM, 25% mLAD, 50% D1, 25/50% mLCx, 50% dLCx, 25% OM2, 100% pRCA --> med mgmt; b.) LHC 04/25/2010: 25% pLM, 25% pLAD, 50% pLCx, 70% dLCx, 100% pRCA --> med mgmt   Depression    Diastolic dysfunction    a.) TTE 04/05/2018: EF 65%, no RWMAs, G1DD, triv MR/TR; b.) TTE 10/15/2022: EF 88%, mod LVH, G1DD, triv TR, mild MR; c.) TTE 02/27/2023: EF 65%, no RWMAs, triv TR/MR, RVSP 35; d.) TTE 03/21/2023: EF 65%, no RWMAs, norm RVSF, PASP 44   Environmental allergies    GI bleed 03/24/2023   Headache    Hyperlipidemia    Hypertension    Hypoxic respiratory failure (HCC)    Internal hemorrhoids    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[facility])    a.) thinks it was in 2007 --> LHC 04/03/2006 revealed a 100% pRCA --> medically managed   Necrotizing pneumonia (HCC)    Pulmonary HTN (HCC)    a.) TTE 02/27/2023: RVSP 35 mmHg; b.) TTE 03/21/2023: PASP 44 mmHg   PVD  (peripheral vascular disease) with claudication    Shortness of breath dyspnea    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    Substance abuse (HCC)    Substance induced mood disorder (HCC)    Suicidal ideation    Thrombocytopenia    Tobacco abuse    Unstable angina (HCC)    Ventral hernia    Vitamin D  deficiency     Past Surgical History:  Procedure Laterality Date   ABDOMINAL HYSTERECTOMY     APPENDECTOMY     CARDIAC CATHETERIZATION     CARDIAC SURGERY     CHOLECYSTECTOMY     COLONOSCOPY  12/15/2011   OH->bleeding internal hemorrhoids, otherwise normal   COLONOSCOPY WITH PROPOFOL  N/A 04/12/2015   Procedure: COLONOSCOPY WITH PROPOFOL ;  Surgeon: Rogelia Copping, MD;  Location: [facility] ENDOSCOPY;  Service: Endoscopy;  Laterality: N/A;   COLONOSCOPY WITH PROPOFOL  N/A 01/05/2020   Procedure: COLONOSCOPY WITH PROPOFOL ;  Surgeon: Copping Rogelia, MD;  Location: [facility] ENDOSCOPY;  Service: Endoscopy;  Laterality: N/A;   ENDARTERECTOMY FEMORAL Left 04/26/2015   Procedure: ENDARTERECTOMY FEMORAL;  Surgeon: Selinda GORMAN Gu, MD;  Location: [facility] ORS;  Service: Vascular;  Laterality: Left;   ENDOVASCULAR STENT INSERTION Bilateral    Legs   FLEXIBLE BRONCHOSCOPY  N/A 02/02/2015   Procedure: FLEXIBLE BRONCHOSCOPY;  Surgeon: Elfreda DELENA Bathe, MD;  Location: [facility] ORS;  Service: Pulmonary;  Laterality: N/A;   HEMORRHOID SURGERY     lung mass removed     PERIPHERAL VASCULAR CATHETERIZATION Left 04/25/2015   Procedure: Lower Extremity Angiography;  Surgeon: Selinda GORMAN Gu, MD;  Location: [facility] INVASIVE CV LAB;  Service: Cardiovascular;  Laterality: Left;   PERIPHERAL VASCULAR CATHETERIZATION Left 04/25/2015   Procedure: Lower Extremity Intervention;  Surgeon: Selinda GORMAN Gu, MD;  Location: [facility] INVASIVE CV LAB;  Service: Cardiovascular;  Laterality: Left;   PERIPHERAL VASCULAR CATHETERIZATION Left 01/02/2016   Procedure: Lower Extremity Angiography;  Surgeon: Selinda GORMAN Gu, MD;  Location: [facility] INVASIVE CV LAB;   Service: Cardiovascular;  Laterality: Left;   PERIPHERAL VASCULAR CATHETERIZATION  01/02/2016   Procedure: Lower Extremity Intervention;  Surgeon: Selinda GORMAN Gu, MD;  Location: [facility] INVASIVE CV LAB;  Service: Cardiovascular;;   THROMBECTOMY FEMORAL ARTERY Left 04/26/2015   Procedure: THROMBECTOMY FEMORAL ARTERY;  Surgeon: Selinda GORMAN Gu, MD;  Location: [facility] ORS;  Service: Vascular;  Laterality: Left;   VENTRAL HERNIA REPAIR N/A 07/25/2023   Procedure: HERNIA REPAIR VENTRAL ADULT, open;  Surgeon: Desiderio Schanz, MD;  Location: [facility] ORS;  Service: General;  Laterality: N/A;   VISCERAL ANGIOGRAPHY N/A 04/06/2024   Procedure: VISCERAL ANGIOGRAPHY;  Surgeon: Gu Selinda GORMAN, MD;  Location: [facility] INVASIVE CV LAB;  Service: Cardiovascular;  Laterality: N/A;   VISCERAL ANGIOGRAPHY N/A 05/21/2024   Procedure: VISCERAL ANGIOGRAPHY;  Surgeon: Gu Selinda GORMAN, MD;  Location: [facility] INVASIVE CV LAB;  Service: Cardiovascular;  Laterality: N/A;    Current Outpatient Medications  Medication Sig Dispense Refill   albuterol  (VENTOLIN  HFA) 108 (90 Base) MCG/ACT inhaler Inhale 2 puffs into the lungs every 4 (four) hours as needed for wheezing or shortness of breath. 9 g 1   Atorvastatin  Calcium  (LIPITOR  PO) Take by mouth.     budesonide -formoterol  (SYMBICORT ) 80-4.5 MCG/ACT inhaler INHALE 2 PUFFS ONCE DAILY 22 g 2   carvedilol  (COREG ) 3.125 MG tablet Take 1 tablet (3.125 mg total) by mouth 2 (two) times daily. 60 tablet 11   Cholecalciferol  (VITAMIN D3) 10 MCG (400 UNIT) tablet Take 400 Units by mouth.     cyclobenzaprine  (FLEXERIL ) 5 MG tablet TAKE 1 TABLET BY MOUTH AT BEDTIME AS NEEDED FOR MUSCLE SPASM 30 tablet 0   dapagliflozin  propanediol (FARXIGA ) 10 MG TABS tablet Take 1 tablet (10 mg total) by mouth daily before breakfast. 30 tablet 3   losartan  (COZAAR ) 100 MG tablet Take 1 tablet (100 mg total) by mouth daily. 30 tablet 11   oxyCODONE  (ROXICODONE ) 5 MG immediate release tablet Take 1 tablet (5 mg total) by mouth 2 (two)  times daily as needed for severe pain (pain score 7-10). 40 tablet 0   No current facility-administered medications for this visit.    Allergies as of 09/22/2024 - Review Complete 09/22/2024  Allergen Reaction Noted   Tylenol  [acetaminophen ] Anaphylaxis 04/07/2024   Aspirin  Nausea And Vomiting and Other (See Comments) 01/22/2015   Zyrtec [cetirizine] Other (See Comments) 09/06/2016   Tramadol Rash 11/11/2017    Family History  Problem Relation Age of Onset   Hypertension Mother    Hypertension Father    Breast cancer Neg Hx     Social History   Socioeconomic History   Marital status: Single    Spouse name: Not on file   Number of children: Not on file   Years of education: Not on file  Highest education level: Not on file  Occupational History   Not on file  Tobacco Use   Smoking status: Every Day    Current packs/day: 1.00    Types: Cigarettes    Passive exposure: Past   Smokeless tobacco: Never   Tobacco comments:    1 PPD  Vaping Use   Vaping status: Never Used  Substance and Sexual Activity   Alcohol use: Not Currently    Comment: weekends, couple beers, pint of liquer only on weekends.  Last drank approx 1 month ago.   Drug use: No   Sexual activity: Not Currently  Other Topics Concern   Not on file  Social History Narrative   Lives with family    Social Drivers of Health   Tobacco Use: High Risk (09/22/2024)   Patient History    Smoking Tobacco Use: Every Day    Smokeless Tobacco Use: Never    Passive Exposure: Past  Financial Resource Strain: Low Risk (03/21/2023)   Received from [facility]   Overall Financial Resource Strain (CARDIA)    Difficulty of Paying Living Expenses: Not hard at all  Food Insecurity: No Food Insecurity (04/13/2024)   Epic    Worried About Radiation Protection Practitioner of Food in the Last Year: Never true    Ran Out of Food in the Last Year: Never true  Transportation Needs: No Transportation Needs (04/13/2024)   Epic    Lack of  Transportation (Medical): No    Lack of Transportation (Non-Medical): No  Physical Activity: Not on file  Stress: Not on file  Social Connections: Socially Isolated (04/06/2024)   Social Connection and Isolation Panel    Frequency of Communication with Friends and Family: Once a week    Frequency of Social Gatherings with Friends and Family: Once a week    Attends Religious Services: 1 to 4 times per year    Active Member of [employer] or Organizations: No    Attends Banker Meetings: Never    Marital Status: Never married  Intimate Partner Violence: Not At Risk (04/13/2024)   Epic    Fear of Current or Ex-Partner: No    Emotionally Abused: No    Physically Abused: No    Sexually Abused: No  Depression (PHQ2-9): Medium Risk (07/16/2023)   Depression (PHQ2-9)    PHQ-2 Score: 8  Alcohol Screen: Medium Risk (06/26/2023)   Alcohol Screen    Last Alcohol Screening Score (AUDIT): 10  Housing: Low Risk (04/13/2024)   Epic    Unable to Pay for Housing in the Last Year: No    Number of Times Moved in the Last Year: 0    Homeless in the Last Year: No  Utilities: Not At Risk (04/13/2024)   Epic    Threatened with loss of utilities: No  Health Literacy: Not on file     RELEVANT GI HISTORY, IMAGING AND LABS: CBC    Component Value Date/Time   WBC 4.3 07/27/2024 0957   WBC 4.6 04/09/2024 0435   RBC 4.26 07/27/2024 0957   RBC 2.82 (L) 04/09/2024 0435   HGB 11.5 07/27/2024 0957   HCT 37.3 07/27/2024 0957   PLT 251 07/27/2024 0957   MCV 88 07/27/2024 0957   MCV 88 10/15/2013 1123   MCH 27.0 07/27/2024 0957   MCH 32.6 04/09/2024 0435   MCHC 30.8 (L) 07/27/2024 0957   MCHC 33.6 04/09/2024 0435   RDW 13.6 07/27/2024 0957   RDW 14.9 (H) 10/15/2013 1123  LYMPHSABS 1.4 07/27/2024 0957   LYMPHSABS 0.9 (L) 10/15/2013 1123   MONOABS 0.3 05/05/2023 1748   MONOABS 0.4 10/15/2013 1123   EOSABS 0.1 07/27/2024 0957   EOSABS 0.0 10/15/2013 1123   BASOSABS 0.0 07/27/2024 0957    BASOSABS 0.0 10/15/2013 1123   Recent Labs    03/28/24 2204 04/03/24 1202 04/05/24 2053 04/06/24 1201 04/06/24 2233 04/07/24 0602 04/08/24 0427 04/09/24 0435 04/24/24 1330 07/27/24 0957  HGB 11.6* 11.9* 11.2* 8.9* 9.2* 8.6* 8.7* 9.2* 10.9* 11.5    CMP     Component Value Date/Time   NA 139 04/24/2024 1330   NA 131 (L) 10/15/2013 1123   K 4.4 04/24/2024 1330   K 3.6 10/15/2013 1123   CL 103 04/24/2024 1330   CL 102 10/15/2013 1123   CO2 20 04/24/2024 1330   CO2 24 10/15/2013 1123   GLUCOSE 93 04/24/2024 1330   GLUCOSE 112 (H) 04/10/2024 0435   GLUCOSE 111 (H) 10/15/2013 1123   BUN 11 05/21/2024 1015   BUN 9 04/24/2024 1330   BUN 12 10/15/2013 1123   CREATININE 0.72 07/27/2024 0957   CREATININE 1.20 10/15/2013 1123   CALCIUM  10.3 07/27/2024 0957   CALCIUM  9.1 10/15/2013 1123   PROT 7.4 04/24/2024 1330   ALBUMIN 4.4 04/24/2024 1330   AST 16 04/24/2024 1330   ALT 11 04/24/2024 1330   ALKPHOS 80 04/24/2024 1330   BILITOT 0.3 04/24/2024 1330   GFRNONAA >60 05/21/2024 1015   GFRNONAA 50 (L) 10/15/2013 1123   GFRAA >60 03/02/2020 1232   GFRAA 58 (L) 10/15/2013 1123      Latest Ref Rng & Units 04/24/2024    1:30 PM 04/05/2024    8:53 PM 04/03/2024   12:02 PM  Hepatic Function  Total Protein 6.0 - 8.5 g/dL 7.4  7.8  7.3   Albumin 3.9 - 4.9 g/dL 4.4  4.2  4.0   AST 0 - 40 IU/L 16  23  22    ALT 0 - 32 IU/L 11  16  14    Alk Phosphatase 44 - 121 IU/L 80  61  50   Total Bilirubin 0.0 - 1.2 mg/dL 0.3  0.4  0.7       Review of Systems   All systems reviewed and negative except where noted in HPI.    Physical Exam  BP (!) 183/96   Pulse 73   Temp 97.6 F (36.4 C)   Ht 5' 4 (1.626 m)   Wt 98 lb 9.6 oz (44.7 kg)   LMP  (LMP Unknown)   SpO2 100%   BMI 16.92 kg/m  No LMP recorded (lmp unknown). Patient has had a hysterectomy. General:   Alert and oriented. Pleasant and cooperative. Well-nourished and well-developed. In no acute distress.  Head:  Normocephalic  and atraumatic. Eyes:  Without icterus Ears:  Normal auditory acuity. Rectal:  Deferred. Msk:  Symmetrical without gross deformities. Normal posture. Extremities:  Without edema. Neurologic:  Alert and  oriented x4;  grossly normal neurologically. Skin:  Intact without significant lesions or rashes. Psych:  Alert and cooperative. Normal mood and affect.   Assessment & Plan   Hannia Matchett is a 69 y.o. female presenting today for follow-up for abdominal pain, constipation, loose stools.   Chronic diarrhea and abdominal pain, at times constipation.  - Ordered stool studies for infectious pathogens and fecal inflammatory markers. - Patient has not tried recommendations from last visit.  Encouraged again to start fiber supplementation and MiraLAX . Increase water intake.  Mesenteric ischemia. Continues to have intermittent abdominal pain.  Patient was seen by [facility] vascular surgery on 08/12/2024 with recommendations to not have repeat surgical interventions.   - Keep follow-up appointment tomorrow with [facility] - Advised smoking cessation  Patient is due for surveillance colonoscopy 01/2025.  Will schedule today.   The patient was found to have elevated blood pressure when vital signs were checked in the office. The blood pressure was rechecked by the nursing staff and it was found be persistently elevated >140/90 mmHg. I personally advised to the patient to follow up closely with PCP for hypertension control.   Follow-up in 4 months   Grayce Bohr, DNP, AGNP-C [facility]    "

## 2024-09-22 ENCOUNTER — Encounter: Payer: Self-pay | Admitting: Family Medicine

## 2024-09-22 ENCOUNTER — Ambulatory Visit: Admitting: Family Medicine

## 2024-09-22 VITALS — BP 183/96 | HR 73 | Temp 97.6°F | Ht 64.0 in | Wt 98.6 lb

## 2024-09-22 DIAGNOSIS — R195 Other fecal abnormalities: Secondary | ICD-10-CM | POA: Diagnosis not present

## 2024-09-22 DIAGNOSIS — R1033 Periumbilical pain: Secondary | ICD-10-CM

## 2024-09-22 DIAGNOSIS — Z860100 Personal history of colon polyps, unspecified: Secondary | ICD-10-CM

## 2024-09-22 DIAGNOSIS — R109 Unspecified abdominal pain: Secondary | ICD-10-CM | POA: Diagnosis not present

## 2024-09-22 DIAGNOSIS — K559 Vascular disorder of intestine, unspecified: Secondary | ICD-10-CM

## 2024-09-23 ENCOUNTER — Encounter (INDEPENDENT_AMBULATORY_CARE_PROVIDER_SITE_OTHER): Payer: Self-pay | Admitting: Nurse Practitioner

## 2024-09-23 ENCOUNTER — Ambulatory Visit (INDEPENDENT_AMBULATORY_CARE_PROVIDER_SITE_OTHER): Admitting: Nurse Practitioner

## 2024-09-23 ENCOUNTER — Other Ambulatory Visit (INDEPENDENT_AMBULATORY_CARE_PROVIDER_SITE_OTHER)

## 2024-09-23 VITALS — BP 175/92 | HR 72 | Resp 18 | Wt 99.4 lb

## 2024-09-23 DIAGNOSIS — K559 Vascular disorder of intestine, unspecified: Secondary | ICD-10-CM

## 2024-09-23 DIAGNOSIS — I701 Atherosclerosis of renal artery: Secondary | ICD-10-CM

## 2024-09-23 DIAGNOSIS — I10 Essential (primary) hypertension: Secondary | ICD-10-CM | POA: Diagnosis not present

## 2024-09-27 ENCOUNTER — Encounter (INDEPENDENT_AMBULATORY_CARE_PROVIDER_SITE_OTHER): Payer: Self-pay | Admitting: Nurse Practitioner

## 2024-09-27 NOTE — Progress Notes (Signed)
 "  Subjective:    Patient ID: [name], female    DOB: 08/17/1956, 69 y.o.   MRN: [id number] Chief Complaint  Patient presents with   Follow-up    3 month mesenteric follow up    HPI  Discussed the use of AI scribe software for clinical note transcription with the patient, who gave verbal consent to proceed.  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  She has chronic mesenteric ischemia with prior stenting and balloon angioplasty of a main mesenteric artery. Recent abdominal ultrasound demonstrates persistent patency of the previously stented artery, with apparent worsening stenosis. CT imaging at [facility] revealed extensive microvascular disease involving the small abdominal vessels. She denies new or worsening abdominal symptoms and describes her abdominal status as stable. She declines further surgical intervention. She is not currently taking anticoagulation and has not been on blood thinners recently.  She has renal artery stenosis, more pronounced on the left, with imaging also showing an accessory renal artery without significant stenosis. She has hypertension, which she describes as intermittently elevated, but generally well controlled with medication adherence, as confirmed by her cardiologist. She has not yet taken her antihypertensive medication today.  She has coronary artery disease with prior myocardial infarction but denies any history of heart failure. She is followed by a cardiologist. She is a current smoker.    Results Radiology Mesenteric artery ultrasound (09/23/2024): One mesenteric artery patent with increased compensatory flow; another mesenteric artery occluded. Abdominal CT: Extensive microvascular disease; left renal artery stenosis; accessory renal artery without stenosis.   Review  of Systems  Gastrointestinal:  Positive for abdominal pain.  All other systems reviewed and are negative.      Objective:   Physical Exam Vitals reviewed.  HENT:     Head: Normocephalic.  Cardiovascular:     Rate and Rhythm: Normal rate.  Pulmonary:     Effort: Pulmonary effort is normal.  Skin:    General: Skin is warm and dry.  Neurological:     Mental Status: She is alert and oriented to person, place, and time.  Psychiatric:        Mood and Affect: Mood normal.        Behavior: Behavior normal.        Thought Content: Thought content normal.        Judgment: Judgment normal.     Physical Exam    BP (!) 175/92 (BP Location: Right Arm)   Pulse 72   Resp 18   Wt 99 lb 6.4 oz (45.1 kg)   LMP  (LMP Unknown)   BMI 17.06 kg/m   Past Medical History:  Diagnosis Date   Adenocarcinoma of upper lobe of right lung (HCC)    a.) s/p posterior segmentectomy on 03/20/23 at [facility]   Anemia    Arthritis    Asthma    Carotid artery stenosis    a.) doppler 05/14/2017: 1-39% BICA; b.) doppler 04/05/2018: 50-69% BICA; c.) doppler 06/12/2019: 40-59% BICA; d.) doppler 06/14/2020, 06/14/2021: 1-39% BICA; e.) doppler 06/19/2022: 1.39% RICA, 40-59% LICA   Colon polyp    COPD (chronic obstructive pulmonary disease) (HCC)    Coronary artery disease 04/03/2006   a.) LHC 04/03/2006: 25% mLM, 25% mLAD, 50% D1, 25/50% mLCx, 50% dLCx, 25% OM2, 100% pRCA --> med mgmt; b.) LHC 04/25/2010: 25% pLM, 25% pLAD, 50% pLCx,  70% dLCx, 100% pRCA --> med mgmt   Depression    Diastolic dysfunction    a.) TTE 04/05/2018: EF 65%, no RWMAs, G1DD, triv MR/TR; b.) TTE 10/15/2022: EF 88%, mod LVH, G1DD, triv TR, mild MR; c.) TTE 02/27/2023: EF 65%, no RWMAs, triv TR/MR, RVSP 35; d.) TTE 03/21/2023: EF 65%, no RWMAs, norm RVSF, PASP 44   Environmental allergies    GI bleed 03/24/2023   Headache    Hyperlipidemia    Hypertension    Hypoxic respiratory failure (HCC)    Internal hemorrhoids    Myocardial  infarct [facility])    a.) thinks it was in 2007 --> LHC 04/03/2006 revealed a 100% pRCA --> medically managed   Necrotizing pneumonia (HCC)    Pulmonary HTN (HCC)    a.) TTE 02/27/2023: RVSP 35 mmHg; b.) TTE 03/21/2023: PASP 44 mmHg   PVD (peripheral vascular disease) with claudication    Shortness of breath dyspnea    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    Substance abuse (HCC)    Substance induced mood disorder (HCC)    Suicidal ideation    Thrombocytopenia    Tobacco abuse    Unstable angina (HCC)    Ventral hernia    Vitamin D  deficiency     Social History   Socioeconomic History   Marital status: Single    Spouse name: Not on file   Number of children: Not on file   Years of education: Not on file   Highest education level: Not on file  Occupational History   Not on file  Tobacco Use   Smoking status: Every Day    Current packs/day: 1.00    Types: Cigarettes    Passive exposure: Past   Smokeless tobacco: Never   Tobacco comments:    1 PPD  Vaping Use   Vaping status: Never Used  Substance and Sexual Activity   Alcohol use: Not Currently    Comment: weekends, couple beers, pint of liquer only on weekends.  Last drank approx 1 month ago.   Drug use: No   Sexual activity: Not Currently  Other Topics Concern   Not on file  Social History Narrative   Lives with family    Social Drivers of Health   Tobacco Use: High Risk (09/23/2024)   Patient History    Smoking Tobacco Use: Every Day    Smokeless Tobacco Use: Never    Passive Exposure: Past  Financial Resource Strain: Low Risk (03/21/2023)   [facility]   Overall Financial Resource Strain (CARDIA)    Difficulty of Paying Living Expenses: Not hard at all  Food Insecurity: No Food Insecurity (04/13/2024)   Epic    Worried About Radiation Protection Practitioner of Food in the Last Year: Never true    Ran Out of Food in the Last Year: Never true  Transportation Needs: No Transportation Needs (04/13/2024)    Epic    Lack of Transportation (Medical): No    Lack of Transportation (Non-Medical): No  Physical Activity: Not on file  Stress: Not on file  Social Connections: Socially Isolated (04/06/2024)   Social Connection and Isolation Panel    Frequency of Communication with Friends and Family: Once a week    Frequency of Social Gatherings with Friends and Family: Once a week    Attends Religious Services: 1 to 4 times per year    Active Member of [employer] or Organizations: No    Attends Banker Meetings: Never  Marital Status: Never married  Intimate Partner Violence: Not At Risk (04/13/2024)   Epic    Fear of Current or Ex-Partner: No    Emotionally Abused: No    Physically Abused: No    Sexually Abused: No  Depression (PHQ2-9): Medium Risk (07/16/2023)   Depression (PHQ2-9)    PHQ-2 Score: 8  Alcohol Screen: Medium Risk (06/26/2023)   Alcohol Screen    Last Alcohol Screening Score (AUDIT): 10  Housing: Low Risk (04/13/2024)   Epic    Unable to Pay for Housing in the Last Year: No    Number of Times Moved in the Last Year: 0    Homeless in the Last Year: No  Utilities: Not At Risk (04/13/2024)   Epic    Threatened with loss of utilities: No  Health Literacy: Not on file    Past Surgical History:  Procedure Laterality Date   ABDOMINAL HYSTERECTOMY     APPENDECTOMY     CARDIAC CATHETERIZATION     CARDIAC SURGERY     CHOLECYSTECTOMY     COLONOSCOPY  12/15/2011   OH->bleeding internal hemorrhoids, otherwise normal   COLONOSCOPY WITH PROPOFOL  N/A 04/12/2015   Procedure: COLONOSCOPY WITH PROPOFOL ;  Surgeon: Rogelia Copping, MD;  Location: [facility];  Service: Endoscopy;  Laterality: N/A;   COLONOSCOPY WITH PROPOFOL  N/A 01/05/2020   Procedure: COLONOSCOPY WITH PROPOFOL ;  Surgeon: Copping Rogelia, MD;  Location: [facility];  Service: Endoscopy;  Laterality: N/A;   ENDARTERECTOMY FEMORAL Left 04/26/2015   Procedure: ENDARTERECTOMY FEMORAL;  Surgeon: Selinda GORMAN Gu, MD;   Location: [facility];  Service: Vascular;  Laterality: Left;   ENDOVASCULAR STENT INSERTION Bilateral    Legs   FLEXIBLE BRONCHOSCOPY N/A 02/02/2015   Procedure: FLEXIBLE BRONCHOSCOPY;  Surgeon: Elfreda DELENA Bathe, MD;  Location: [facility];  Service: Pulmonary;  Laterality: N/A;   HEMORRHOID SURGERY     lung mass removed     PERIPHERAL VASCULAR CATHETERIZATION Left 04/25/2015   Procedure: Lower Extremity Angiography;  Surgeon: Selinda GORMAN Gu, MD;  Location: [facility];  Service: Cardiovascular;  Laterality: Left;   PERIPHERAL VASCULAR CATHETERIZATION Left 04/25/2015   Procedure: Lower Extremity Intervention;  Surgeon: Selinda GORMAN Gu, MD;  Location: [facility];  Service: Cardiovascular;  Laterality: Left;   PERIPHERAL VASCULAR CATHETERIZATION Left 01/02/2016   Procedure: Lower Extremity Angiography;  Surgeon: Selinda GORMAN Gu, MD;  Location: [facility];  Service: Cardiovascular;  Laterality: Left;   PERIPHERAL VASCULAR CATHETERIZATION  01/02/2016   Procedure: Lower Extremity Intervention;  Surgeon: Selinda GORMAN Gu, MD;  Location: [facility];  Service: Cardiovascular;;   THROMBECTOMY FEMORAL ARTERY Left 04/26/2015   Procedure: THROMBECTOMY FEMORAL ARTERY;  Surgeon: Selinda GORMAN Gu, MD;  Location: [facility];  Service: Vascular;  Laterality: Left;   VENTRAL HERNIA REPAIR N/A 07/25/2023   Procedure: HERNIA REPAIR VENTRAL ADULT, open;  Surgeon: Desiderio Schanz, MD;  Location: [facility];  Service: General;  Laterality: N/A;   VISCERAL ANGIOGRAPHY N/A 04/06/2024   Procedure: VISCERAL ANGIOGRAPHY;  Surgeon: Gu Selinda GORMAN, MD;  Location: [facility];  Service: Cardiovascular;  Laterality: N/A;   VISCERAL ANGIOGRAPHY N/A 05/21/2024   Procedure: VISCERAL ANGIOGRAPHY;  Surgeon: Gu Selinda GORMAN, MD;  Location: [facility];  Service: Cardiovascular;  Laterality: N/A;    Family History  Problem Relation Age of Onset   Hypertension Mother    Hypertension Father    Breast cancer Neg Hx      Allergies[1]  Latest Ref Rng & Units 07/27/2024    9:57 AM 04/24/2024    1:30 PM 04/09/2024    4:35 AM  CBC  WBC 3.4 - 10.8 x10E3/uL 4.3  5.2  4.6   Hemoglobin 11.1 - 15.9 g/dL 88.4  89.0  9.2   Hematocrit 34.0 - 46.6 % 37.3  34.1  27.4   Platelets 150 - 450 x10E3/uL 251  318  226       CMP     Component Value Date/Time   NA 139 04/24/2024 1330   NA 131 (L) 10/15/2013 1123   K 4.4 04/24/2024 1330   K 3.6 10/15/2013 1123   CL 103 04/24/2024 1330   CL 102 10/15/2013 1123   CO2 20 04/24/2024 1330   CO2 24 10/15/2013 1123   GLUCOSE 93 04/24/2024 1330   GLUCOSE 112 (H) 04/10/2024 0435   GLUCOSE 111 (H) 10/15/2013 1123   BUN 11 05/21/2024 1015   BUN 9 04/24/2024 1330   BUN 12 10/15/2013 1123   CREATININE 0.72 07/27/2024 0957   CREATININE 1.20 10/15/2013 1123   CALCIUM  10.3 07/27/2024 0957   CALCIUM  9.1 10/15/2013 1123   PROT 7.4 04/24/2024 1330   ALBUMIN 4.4 04/24/2024 1330   AST 16 04/24/2024 1330   ALT 11 04/24/2024 1330   ALKPHOS 80 04/24/2024 1330   BILITOT 0.3 04/24/2024 1330   EGFR 91 07/27/2024 0957   GFRNONAA >60 05/21/2024 1015   GFRNONAA 50 (L) 10/15/2013 1123     No results found.     Assessment & Plan:   1. Mesenteric ischemia (Primary) Chronic mesenteric ischemia Chronic mesenteric ischemia post stenting and balloon angioplasty with patent stented artery and worsening narrowing due to compensatory increased flow. Extensive microvascular disease not suitable for surgery. Cilostazol not initiated due to cardiac history and variable efficacy. Smoking cessation advised. - Reviewed abdominal ultrasound and CT findings. - Discussed no further surgical intervention due to preference and limited benefit. - Advised smoking cessation. - Scheduled follow-up in six months. - VAS US  MESENTERIC; Future   2. Essential hypertension Continue antihypertensive medications as already ordered, these medications have been reviewed and there are no changes at  this time.  3. Renal artery stenosis Renal artery stenosis with hypertension Renal artery stenosis with well-controlled hypertension on current regimen. No intervention needed as blood pressure is controlled. - Reviewed CT findings of renal artery stenosis. - Advised continued adherence to antihypertensive regimen and blood pressure monitoring. - No intervention planned unless blood pressure becomes uncontrolled.   Assessment and Plan Assessment & Plan       Medications Ordered Prior to Encounter[2]  There are no Patient Instructions on file for this visit. Return in about 6 months (around 03/23/2025) for Mesenteric Stenosis see JD/FB .   Kealy Lewter E Karthikeya Funke, NP      [1]  Allergies Allergen Reactions   Tylenol  [Acetaminophen ] Anaphylaxis   Aspirin  Nausea And Vomiting and Other (See Comments)    Pt states aspirin  makes her cramp and have to use coated kind.   Zyrtec [Cetirizine] Other (See Comments)    Upset stomach   Tramadol Rash  [2]  Current Outpatient Medications on File Prior to Visit  Medication Sig Dispense Refill   albuterol  (VENTOLIN  HFA) 108 (90 Base) MCG/ACT inhaler Inhale 2 puffs into the lungs every 4 (four) hours as needed for wheezing or shortness of breath. 9 g 1   budesonide -formoterol  (SYMBICORT ) 80-4.5 MCG/ACT inhaler INHALE 2 PUFFS ONCE DAILY 22 g 2   carvedilol  (COREG )  3.125 MG tablet Take 1 tablet (3.125 mg total) by mouth 2 (two) times daily. 60 tablet 11   Cholecalciferol  (VITAMIN D3) 10 MCG (400 UNIT) tablet Take 400 Units by mouth.     cyclobenzaprine  (FLEXERIL ) 5 MG tablet TAKE 1 TABLET BY MOUTH AT BEDTIME AS NEEDED FOR MUSCLE SPASM 30 tablet 0   dapagliflozin  propanediol (FARXIGA ) 10 MG TABS tablet Take 1 tablet (10 mg total) by mouth daily before breakfast. 30 tablet 3   losartan  (COZAAR ) 100 MG tablet Take 1 tablet (100 mg total) by mouth daily. 30 tablet 11   oxyCODONE  (ROXICODONE ) 5 MG immediate release tablet Take 1 tablet (5 mg total) by  mouth 2 (two) times daily as needed for severe pain (pain score 7-10). 40 tablet 0   Atorvastatin  Calcium  (LIPITOR  PO) Take by mouth. (Patient not taking: Reported on 09/23/2024)     No current facility-administered medications on file prior to visit.   "

## 2024-10-07 ENCOUNTER — Other Ambulatory Visit: Payer: Self-pay

## 2024-10-07 DIAGNOSIS — R109 Unspecified abdominal pain: Secondary | ICD-10-CM

## 2024-10-07 MED ORDER — CYCLOBENZAPRINE HCL 5 MG PO TABS: ORAL_TABLET | ORAL | 0 refills | Status: AC

## 2024-10-08 ENCOUNTER — Encounter: Payer: Self-pay | Admitting: Cardiovascular Disease

## 2024-10-08 ENCOUNTER — Ambulatory Visit (INDEPENDENT_AMBULATORY_CARE_PROVIDER_SITE_OTHER): Admitting: Cardiovascular Disease

## 2024-10-08 VITALS — BP 126/80 | HR 86 | Ht 64.0 in | Wt 101.4 lb

## 2024-10-08 DIAGNOSIS — I6523 Occlusion and stenosis of bilateral carotid arteries: Secondary | ICD-10-CM | POA: Diagnosis not present

## 2024-10-08 DIAGNOSIS — I5023 Acute on chronic systolic (congestive) heart failure: Secondary | ICD-10-CM | POA: Diagnosis not present

## 2024-10-08 DIAGNOSIS — I2510 Atherosclerotic heart disease of native coronary artery without angina pectoris: Secondary | ICD-10-CM | POA: Diagnosis not present

## 2024-10-08 DIAGNOSIS — I2583 Coronary atherosclerosis due to lipid rich plaque: Secondary | ICD-10-CM | POA: Diagnosis not present

## 2024-10-08 DIAGNOSIS — R0602 Shortness of breath: Secondary | ICD-10-CM | POA: Diagnosis not present

## 2024-10-08 DIAGNOSIS — F17219 Nicotine dependence, cigarettes, with unspecified nicotine-induced disorders: Secondary | ICD-10-CM

## 2024-10-08 DIAGNOSIS — Z8639 Personal history of other endocrine, nutritional and metabolic disease: Secondary | ICD-10-CM

## 2024-10-08 DIAGNOSIS — R000 Tachycardia, unspecified: Secondary | ICD-10-CM | POA: Diagnosis not present

## 2024-10-08 DIAGNOSIS — I10 Essential (primary) hypertension: Secondary | ICD-10-CM | POA: Diagnosis not present

## 2024-10-08 DIAGNOSIS — N179 Acute kidney failure, unspecified: Secondary | ICD-10-CM

## 2024-10-08 MED ORDER — ATORVASTATIN CALCIUM 20 MG PO TABS: 20.0000 mg | ORAL_TABLET | Freq: Every day | ORAL | 3 refills | Status: AC

## 2024-10-08 NOTE — Progress Notes (Signed)
 "     Cardiology Office Note   Date:  10/08/2024   ID:  [name], DOB December 24, 1955, MRN [id number]  PCP:  Liana Fish, NP  Cardiologist:  Denyse Bathe, MD      History of Present Illness: [name] is a 69 y.o. female who presents for  Chief Complaint  Patient presents with   Follow-up    4 week follow up    Has chest pain and SOB.      Past Medical History:  Diagnosis Date   Adenocarcinoma of upper lobe of right lung (HCC)    a.) s/p posterior segmentectomy on 03/20/23 at [facility]   Anemia    Arthritis    Asthma    Carotid artery stenosis    a.) doppler 05/14/2017: 1-39% BICA; b.) doppler 04/05/2018: 50-69% BICA; c.) doppler 06/12/2019: 40-59% BICA; d.) doppler 06/14/2020, 06/14/2021: 1-39% BICA; e.) doppler 06/19/2022: 1.39% RICA, 40-59% LICA   Colon polyp    COPD (chronic obstructive pulmonary disease) (HCC)    Coronary artery disease 04/03/2006   a.) LHC 04/03/2006: 25% mLM, 25% mLAD, 50% D1, 25/50% mLCx, 50% dLCx, 25% OM2, 100% pRCA --> med mgmt; b.) LHC 04/25/2010: 25% pLM, 25% pLAD, 50% pLCx, 70% dLCx, 100% pRCA --> med mgmt   Depression    Diastolic dysfunction    a.) TTE 04/05/2018: EF 65%, no RWMAs, G1DD, triv MR/TR; b.) TTE 10/15/2022: EF 88%, mod LVH, G1DD, triv TR, mild MR; c.) TTE 02/27/2023: EF 65%, no RWMAs, triv TR/MR, RVSP 35; d.) TTE 03/21/2023: EF 65%, no RWMAs, norm RVSF, PASP 44   Environmental allergies    GI bleed 03/24/2023   Headache    Hyperlipidemia    Hypertension    Hypoxic respiratory failure (HCC)    Internal hemorrhoids    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[facility])    a.) thinks it was in 2007 --> LHC 04/03/2006 revealed a 100% pRCA --> medically managed   Necrotizing pneumonia (HCC)    Pulmonary HTN (HCC)    a.) TTE 02/27/2023: RVSP 35 mmHg; b.) TTE 03/21/2023: PASP 44 mmHg   PVD (peripheral vascular disease) with claudication    Shortness of breath dyspnea    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    Substance abuse (HCC)     Substance induced mood disorder (HCC)    Suicidal ideation    Thrombocytopenia    Tobacco abuse    Unstable angina (HCC)    Ventral hernia    Vitamin D  deficiency      Past Surgical History:  Procedure Laterality Date   ABDOMINAL HYSTERECTOMY     APPENDECTOMY     CARDIAC CATHETERIZATION     CARDIAC SURGERY     CHOLECYSTECTOMY     COLONOSCOPY  12/15/2011   OH->bleeding internal hemorrhoids, otherwise normal   COLONOSCOPY WITH PROPOFOL  N/A 04/12/2015   Procedure: COLONOSCOPY WITH PROPOFOL ;  Surgeon: Rogelia Copping, MD;  Location: [facility];  Service: Endoscopy;  Laterality: N/A;   COLONOSCOPY WITH PROPOFOL  N/A 01/05/2020   Procedure: COLONOSCOPY WITH PROPOFOL ;  Surgeon: Copping Rogelia, MD;  Location: [facility];  Service: Endoscopy;  Laterality: N/A;   ENDARTERECTOMY FEMORAL Left 04/26/2015   Procedure: ENDARTERECTOMY FEMORAL;  Surgeon: Selinda GORMAN Gu, MD;  Location: [facility];  Service: Vascular;  Laterality: Left;   ENDOVASCULAR STENT INSERTION Bilateral    Legs   FLEXIBLE BRONCHOSCOPY N/A 02/02/2015   Procedure: FLEXIBLE BRONCHOSCOPY;  Surgeon: Elfreda DELENA Bathe, MD;  Location: [facility];  Service: Pulmonary;  Laterality: N/A;  HEMORRHOID SURGERY     lung mass removed     PERIPHERAL VASCULAR CATHETERIZATION Left 04/25/2015   Procedure: Lower Extremity Angiography;  Surgeon: Selinda GORMAN Gu, MD;  Location: [facility];  Service: Cardiovascular;  Laterality: Left;   PERIPHERAL VASCULAR CATHETERIZATION Left 04/25/2015   Procedure: Lower Extremity Intervention;  Surgeon: Selinda GORMAN Gu, MD;  Location: [facility];  Service: Cardiovascular;  Laterality: Left;   PERIPHERAL VASCULAR CATHETERIZATION Left 01/02/2016   Procedure: Lower Extremity Angiography;  Surgeon: Selinda GORMAN Gu, MD;  Location: [facility];  Service: Cardiovascular;  Laterality: Left;   PERIPHERAL VASCULAR CATHETERIZATION  01/02/2016   Procedure: Lower Extremity Intervention;  Surgeon: Selinda GORMAN Gu, MD;   Location: [facility];  Service: Cardiovascular;;   THROMBECTOMY FEMORAL ARTERY Left 04/26/2015   Procedure: THROMBECTOMY FEMORAL ARTERY;  Surgeon: Selinda GORMAN Gu, MD;  Location: [facility];  Service: Vascular;  Laterality: Left;   VENTRAL HERNIA REPAIR N/A 07/25/2023   Procedure: HERNIA REPAIR VENTRAL ADULT, open;  Surgeon: Desiderio Schanz, MD;  Location: [facility];  Service: General;  Laterality: N/A;   VISCERAL ANGIOGRAPHY N/A 04/06/2024   Procedure: VISCERAL ANGIOGRAPHY;  Surgeon: Gu Selinda GORMAN, MD;  Location: [facility];  Service: Cardiovascular;  Laterality: N/A;   VISCERAL ANGIOGRAPHY N/A 05/21/2024   Procedure: VISCERAL ANGIOGRAPHY;  Surgeon: Gu Selinda GORMAN, MD;  Location: [facility];  Service: Cardiovascular;  Laterality: N/A;     Current Outpatient Medications  Medication Sig Dispense Refill   albuterol  (VENTOLIN  HFA) 108 (90 Base) MCG/ACT inhaler Inhale 2 puffs into the lungs every 4 (four) hours as needed for wheezing or shortness of breath. 9 g 1   budesonide -formoterol  (SYMBICORT ) 80-4.5 MCG/ACT inhaler INHALE 2 PUFFS ONCE DAILY 22 g 2   carvedilol  (COREG ) 3.125 MG tablet Take 1 tablet (3.125 mg total) by mouth 2 (two) times daily. 60 tablet 11   Cholecalciferol  (VITAMIN D3) 10 MCG (400 UNIT) tablet Take 400 Units by mouth.     cyclobenzaprine  (FLEXERIL ) 5 MG tablet TAKE 1 TABLET BY MOUTH AT BEDTIME AS NEEDED FOR MUSCLE SPASM 30 tablet 0   dapagliflozin  propanediol (FARXIGA ) 10 MG TABS tablet Take 1 tablet (10 mg total) by mouth daily before breakfast. 30 tablet 3   losartan  (COZAAR ) 100 MG tablet Take 1 tablet (100 mg total) by mouth daily. 30 tablet 11   atorvastatin  (LIPITOR ) 20 MG tablet Take 1 tablet (20 mg total) by mouth daily. 30 tablet 3   No current facility-administered medications for this visit.    Allergies:   Tylenol  [acetaminophen ], Aspirin , Zyrtec [cetirizine], and Tramadol    Social History:   reports that she has been smoking cigarettes.  She has been exposed to tobacco smoke. She has never used smokeless tobacco. She reports that she does not currently use alcohol. She reports that she does not use drugs.   Family History:  family history includes Hypertension in her father and mother.    ROS:     Review of Systems  Constitutional: Negative.   HENT: Negative.    Eyes: Negative.   Respiratory: Negative.    Gastrointestinal: Negative.   Genitourinary: Negative.   Musculoskeletal: Negative.   Skin: Negative.   Neurological: Negative.   Endo/Heme/Allergies: Negative.   Psychiatric/Behavioral: Negative.    All other systems reviewed and are negative.     All other systems are reviewed and negative.    PHYSICAL EXAM: VS:  BP 126/80   Pulse  86   Ht 5' 4 (1.626 m)   Wt 101 lb 6.4 oz (46 kg)   LMP  (LMP Unknown)   SpO2 (!) 86%   BMI 17.41 kg/m  , BMI Body mass index is 17.41 kg/m. Last weight:  Wt Readings from Last 3 Encounters:  10/08/24 101 lb 6.4 oz (46 kg)  09/23/24 99 lb 6.4 oz (45.1 kg)  09/22/24 98 lb 9.6 oz (44.7 kg)     Physical Exam Constitutional:      Appearance: Normal appearance.  Cardiovascular:     Rate and Rhythm: Normal rate and regular rhythm.     Heart sounds: Normal heart sounds.  Pulmonary:     Effort: Pulmonary effort is normal.     Breath sounds: Normal breath sounds.  Musculoskeletal:     Right lower leg: No edema.     Left lower leg: No edema.  Neurological:     Mental Status: She is alert.       EKG:   Recent Labs: 04/24/2024: ALT 11; Magnesium  1.7; Potassium 4.4; Sodium 139 05/21/2024: BUN 11 07/27/2024: Creatinine, Ser 0.72; Hemoglobin 11.5; Platelets 251    Lipid Panel    Component Value Date/Time   CHOL 200 (H) 07/27/2024 0957   CHOL 147 10/16/2013 0439   TRIG 113 07/27/2024 0957   TRIG 122 10/16/2013 0439   HDL 47 07/27/2024 0957   HDL 31 (L) 10/16/2013 0439   CHOLHDL 4.3 07/27/2024 0957   CHOLHDL 3.3 06/18/2023 0707   VLDL 37 06/18/2023 0707    VLDL 24 10/16/2013 0439   LDLCALC 133 (H) 07/27/2024 0957   LDLCALC 92 10/16/2013 0439      Other studies Reviewed: Additional studies/ records that were reviewed today include:  Review of the above records demonstrates:       No data to display            ASSESSMENT AND PLAN:    ICD-10-CM   1. SOB (shortness of breath)  R06.02 atorvastatin  (LIPITOR ) 20 MG tablet    2. Coronary artery disease involving native coronary artery of native heart without angina pectoris  I25.10 atorvastatin  (LIPITOR ) 20 MG tablet    3. Sinus tachycardia  R00.0 atorvastatin  (LIPITOR ) 20 MG tablet    4. Cigarette nicotine  dependence with nicotine -induced disorder  F17.219 atorvastatin  (LIPITOR ) 20 MG tablet    5. Essential hypertension  I10 atorvastatin  (LIPITOR ) 20 MG tablet    6. AKI (acute kidney injury)  N17.9 atorvastatin  (LIPITOR ) 20 MG tablet    7. Bilateral carotid artery stenosis  I65.23 atorvastatin  (LIPITOR ) 20 MG tablet    8. H/O hypercholesterolemia  Z86.39 atorvastatin  (LIPITOR ) 20 MG tablet    9. Acute on chronic systolic congestive heart failure (HCC)  I50.23 atorvastatin  (LIPITOR ) 20 MG tablet    10. Coronary artery disease due to lipid rich plaque  I25.10 atorvastatin  (LIPITOR ) 20 MG tablet   I25.83        Problem List Items Addressed This Visit       Cardiovascular and Mediastinum   Essential hypertension   Relevant Medications   atorvastatin  (LIPITOR ) 20 MG tablet   Carotid stenosis   Relevant Medications   atorvastatin  (LIPITOR ) 20 MG tablet   Coronary artery disease due to lipid rich plaque   Relevant Medications   atorvastatin  (LIPITOR ) 20 MG tablet     Nervous and Auditory   Cigarette nicotine  dependence with nicotine -induced disorder   Relevant Medications   atorvastatin  (LIPITOR ) 20 MG tablet  Genitourinary   AKI (acute kidney injury)   Relevant Medications   atorvastatin  (LIPITOR ) 20 MG tablet     Other   H/O hypercholesterolemia   Relevant  Medications   atorvastatin  (LIPITOR ) 20 MG tablet   Sinus tachycardia   Relevant Medications   atorvastatin  (LIPITOR ) 20 MG tablet   Other Visit Diagnoses       SOB (shortness of breath)    -  Primary   Relevant Medications   atorvastatin  (LIPITOR ) 20 MG tablet     Coronary artery disease involving native coronary artery of native heart without angina pectoris       Relevant Medications   atorvastatin  (LIPITOR ) 20 MG tablet     Acute on chronic systolic congestive heart failure (HCC)       Relevant Medications   atorvastatin  (LIPITOR ) 20 MG tablet          Disposition:   No follow-ups on file.    Total time spent: 35 minutes  Signed,  Denyse Bathe, MD  10/08/2024 11:19 AM    [facility] "

## 2024-11-10 ENCOUNTER — Ambulatory Visit: Admitting: Nurse Practitioner

## 2025-01-07 ENCOUNTER — Ambulatory Visit: Admitting: Cardiovascular Disease

## 2025-01-27 ENCOUNTER — Ambulatory Visit: Admit: 2025-01-27

## 2025-01-27 SURGERY — COLONOSCOPY
Anesthesia: General

## 2025-02-02 ENCOUNTER — Ambulatory Visit

## 2025-03-23 ENCOUNTER — Encounter (INDEPENDENT_AMBULATORY_CARE_PROVIDER_SITE_OTHER)

## 2025-03-23 ENCOUNTER — Ambulatory Visit (INDEPENDENT_AMBULATORY_CARE_PROVIDER_SITE_OTHER): Admitting: Nurse Practitioner

## 2025-06-28 ENCOUNTER — Ambulatory Visit: Admitting: Internal Medicine

## 2025-06-30 ENCOUNTER — Encounter: Admitting: Internal Medicine

## 2025-07-12 ENCOUNTER — Ambulatory Visit: Admitting: Internal Medicine

## 2025-07-22 ENCOUNTER — Ambulatory Visit: Admitting: Nurse Practitioner
